# Patient Record
Sex: Male | Born: 1945 | Race: White | Hispanic: No | Marital: Married | Smoking: Former smoker
Health system: Southern US, Community
[De-identification: ages and names within clinical notes are randomized; demographics above are authoritative.]

## PROBLEM LIST (undated history)

## (undated) DIAGNOSIS — C4491 Basal cell carcinoma of skin, unspecified: Secondary | ICD-10-CM

## (undated) DIAGNOSIS — Z87442 Personal history of urinary calculi: Secondary | ICD-10-CM

## (undated) DIAGNOSIS — F8081 Childhood onset fluency disorder: Secondary | ICD-10-CM

## (undated) DIAGNOSIS — H9190 Unspecified hearing loss, unspecified ear: Secondary | ICD-10-CM

## (undated) DIAGNOSIS — S022XXA Fracture of nasal bones, initial encounter for closed fracture: Secondary | ICD-10-CM

## (undated) DIAGNOSIS — Z973 Presence of spectacles and contact lenses: Secondary | ICD-10-CM

## (undated) DIAGNOSIS — N200 Calculus of kidney: Secondary | ICD-10-CM

## (undated) DIAGNOSIS — Z860100 Personal history of colon polyps, unspecified: Secondary | ICD-10-CM

## (undated) DIAGNOSIS — T8859XA Other complications of anesthesia, initial encounter: Secondary | ICD-10-CM

## (undated) DIAGNOSIS — G250 Essential tremor: Secondary | ICD-10-CM

## (undated) DIAGNOSIS — R413 Other amnesia: Secondary | ICD-10-CM

## (undated) DIAGNOSIS — Z9289 Personal history of other medical treatment: Secondary | ICD-10-CM

## (undated) DIAGNOSIS — M1990 Unspecified osteoarthritis, unspecified site: Secondary | ICD-10-CM

## (undated) DIAGNOSIS — Z972 Presence of dental prosthetic device (complete) (partial): Secondary | ICD-10-CM

## (undated) DIAGNOSIS — I639 Cerebral infarction, unspecified: Secondary | ICD-10-CM

## (undated) DIAGNOSIS — R42 Dizziness and giddiness: Secondary | ICD-10-CM

## (undated) DIAGNOSIS — T4145XA Adverse effect of unspecified anesthetic, initial encounter: Secondary | ICD-10-CM

## (undated) DIAGNOSIS — F429 Obsessive-compulsive disorder, unspecified: Secondary | ICD-10-CM

## (undated) DIAGNOSIS — E785 Hyperlipidemia, unspecified: Secondary | ICD-10-CM

## (undated) DIAGNOSIS — H269 Unspecified cataract: Secondary | ICD-10-CM

## (undated) DIAGNOSIS — I10 Essential (primary) hypertension: Secondary | ICD-10-CM

## (undated) DIAGNOSIS — IMO0001 Reserved for inherently not codable concepts without codable children: Secondary | ICD-10-CM

## (undated) HISTORY — DX: Hyperlipidemia, unspecified: E78.5

## (undated) HISTORY — DX: Obsessive-compulsive disorder, unspecified: F42.9

## (undated) HISTORY — PX: CYST EXCISION: SHX5701

## (undated) HISTORY — PX: COLLATERAL LIGAMENT REPAIR, ELBOW: SHX1367

## (undated) HISTORY — DX: Basal cell carcinoma of skin, unspecified: C44.91

## (undated) HISTORY — PX: TONSILLECTOMY: SUR1361

## (undated) HISTORY — DX: Essential tremor: G25.0

## (undated) HISTORY — DX: Unspecified osteoarthritis, unspecified site: M19.90

## (undated) HISTORY — PX: OTHER SURGICAL HISTORY: SHX169

## (undated) HISTORY — DX: Personal history of colonic polyps: Z86.010

## (undated) HISTORY — PX: COLONOSCOPY: SHX174

## (undated) HISTORY — DX: Other amnesia: R41.3

## (undated) HISTORY — DX: Essential (primary) hypertension: I10

## (undated) HISTORY — DX: Personal history of colon polyps, unspecified: Z86.0100

## (undated) HISTORY — DX: Unspecified hearing loss, unspecified ear: H91.90

## (undated) HISTORY — DX: Childhood onset fluency disorder: F80.81

## (undated) HISTORY — PX: EXTERNAL EAR SURGERY: SHX627

---
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
## 1988-09-09 HISTORY — PX: CERVICAL FUSION: SHX112

## 1991-09-10 HISTORY — PX: CHOLESTEATOMA EXCISION: SHX1345

## 2002-04-05 ENCOUNTER — Encounter: Payer: Self-pay | Admitting: Emergency Medicine

## 2002-04-05 ENCOUNTER — Emergency Department (HOSPITAL_COMMUNITY): Admission: EM | Admit: 2002-04-05 | Discharge: 2002-04-05 | Payer: Self-pay | Admitting: Emergency Medicine

## 2002-12-08 ENCOUNTER — Encounter: Payer: Self-pay | Admitting: Internal Medicine

## 2003-03-07 ENCOUNTER — Encounter: Payer: Self-pay | Admitting: Internal Medicine

## 2003-03-29 ENCOUNTER — Ambulatory Visit (HOSPITAL_COMMUNITY): Admission: RE | Admit: 2003-03-29 | Discharge: 2003-03-29 | Payer: Self-pay | Admitting: Orthopedic Surgery

## 2003-03-29 ENCOUNTER — Encounter: Payer: Self-pay | Admitting: Orthopedic Surgery

## 2003-05-03 ENCOUNTER — Ambulatory Visit (HOSPITAL_BASED_OUTPATIENT_CLINIC_OR_DEPARTMENT_OTHER): Admission: RE | Admit: 2003-05-03 | Discharge: 2003-05-03 | Payer: Self-pay | Admitting: Orthopedic Surgery

## 2003-05-24 ENCOUNTER — Ambulatory Visit (HOSPITAL_BASED_OUTPATIENT_CLINIC_OR_DEPARTMENT_OTHER): Admission: RE | Admit: 2003-05-24 | Discharge: 2003-05-24 | Payer: Self-pay | Admitting: Orthopedic Surgery

## 2003-08-16 ENCOUNTER — Ambulatory Visit (HOSPITAL_BASED_OUTPATIENT_CLINIC_OR_DEPARTMENT_OTHER): Admission: RE | Admit: 2003-08-16 | Discharge: 2003-08-16 | Payer: Self-pay | Admitting: Orthopedic Surgery

## 2003-08-16 ENCOUNTER — Ambulatory Visit (HOSPITAL_COMMUNITY): Admission: RE | Admit: 2003-08-16 | Discharge: 2003-08-16 | Payer: Self-pay | Admitting: Orthopedic Surgery

## 2004-04-17 ENCOUNTER — Encounter: Payer: Self-pay | Admitting: Internal Medicine

## 2004-06-02 ENCOUNTER — Ambulatory Visit (HOSPITAL_COMMUNITY): Admission: RE | Admit: 2004-06-02 | Discharge: 2004-06-02 | Payer: Self-pay | Admitting: Orthopedic Surgery

## 2004-07-25 ENCOUNTER — Ambulatory Visit: Payer: Self-pay | Admitting: Internal Medicine

## 2004-08-09 ENCOUNTER — Ambulatory Visit (HOSPITAL_BASED_OUTPATIENT_CLINIC_OR_DEPARTMENT_OTHER): Admission: RE | Admit: 2004-08-09 | Discharge: 2004-08-09 | Payer: Self-pay | Admitting: Orthopedic Surgery

## 2004-08-09 ENCOUNTER — Ambulatory Visit (HOSPITAL_COMMUNITY): Admission: RE | Admit: 2004-08-09 | Discharge: 2004-08-09 | Payer: Self-pay | Admitting: Orthopedic Surgery

## 2004-08-09 HISTORY — PX: SHOULDER ARTHROSCOPY W/ ACROMIAL REPAIR: SUR94

## 2004-08-27 ENCOUNTER — Ambulatory Visit: Payer: Self-pay | Admitting: Internal Medicine

## 2004-09-12 ENCOUNTER — Encounter: Payer: Self-pay | Admitting: Internal Medicine

## 2004-11-02 ENCOUNTER — Ambulatory Visit: Payer: Self-pay | Admitting: Internal Medicine

## 2005-01-21 ENCOUNTER — Ambulatory Visit: Payer: Self-pay | Admitting: Internal Medicine

## 2005-03-14 ENCOUNTER — Ambulatory Visit: Payer: Self-pay | Admitting: Internal Medicine

## 2005-05-23 ENCOUNTER — Ambulatory Visit: Payer: Self-pay | Admitting: Internal Medicine

## 2005-07-11 ENCOUNTER — Encounter: Admission: RE | Admit: 2005-07-11 | Discharge: 2005-07-11 | Payer: Self-pay | Admitting: Otolaryngology

## 2005-07-24 ENCOUNTER — Ambulatory Visit: Payer: Self-pay | Admitting: Internal Medicine

## 2005-11-22 ENCOUNTER — Ambulatory Visit: Payer: Self-pay | Admitting: Internal Medicine

## 2005-12-12 ENCOUNTER — Ambulatory Visit: Payer: Self-pay | Admitting: Internal Medicine

## 2006-01-30 ENCOUNTER — Ambulatory Visit: Payer: Self-pay | Admitting: Internal Medicine

## 2006-01-31 ENCOUNTER — Encounter: Payer: Self-pay | Admitting: Internal Medicine

## 2006-12-11 ENCOUNTER — Ambulatory Visit: Payer: Self-pay | Admitting: Internal Medicine

## 2006-12-11 LAB — CONVERTED CEMR LAB: Hgb A1c MFr Bld: 6.8 % — ABNORMAL HIGH (ref 4.6–6.0)

## 2007-03-17 ENCOUNTER — Ambulatory Visit: Payer: Self-pay | Admitting: Internal Medicine

## 2007-03-17 DIAGNOSIS — L0291 Cutaneous abscess, unspecified: Secondary | ICD-10-CM

## 2007-03-17 DIAGNOSIS — L0390 Cellulitis, unspecified: Secondary | ICD-10-CM

## 2007-05-26 DIAGNOSIS — I10 Essential (primary) hypertension: Secondary | ICD-10-CM

## 2007-05-26 DIAGNOSIS — R251 Tremor, unspecified: Secondary | ICD-10-CM | POA: Insufficient documentation

## 2007-05-26 DIAGNOSIS — Z860100 Personal history of colon polyps, unspecified: Secondary | ICD-10-CM | POA: Insufficient documentation

## 2007-05-26 DIAGNOSIS — M4120 Other idiopathic scoliosis, site unspecified: Secondary | ICD-10-CM | POA: Insufficient documentation

## 2007-05-26 DIAGNOSIS — E1142 Type 2 diabetes mellitus with diabetic polyneuropathy: Secondary | ICD-10-CM

## 2007-05-26 DIAGNOSIS — E1165 Type 2 diabetes mellitus with hyperglycemia: Secondary | ICD-10-CM | POA: Insufficient documentation

## 2007-07-07 ENCOUNTER — Telehealth (INDEPENDENT_AMBULATORY_CARE_PROVIDER_SITE_OTHER): Payer: Self-pay | Admitting: *Deleted

## 2007-08-11 ENCOUNTER — Ambulatory Visit: Payer: Self-pay | Admitting: Internal Medicine

## 2007-08-11 DIAGNOSIS — R109 Unspecified abdominal pain: Secondary | ICD-10-CM | POA: Insufficient documentation

## 2007-10-01 ENCOUNTER — Telehealth (INDEPENDENT_AMBULATORY_CARE_PROVIDER_SITE_OTHER): Payer: Self-pay | Admitting: *Deleted

## 2007-10-16 ENCOUNTER — Telehealth (INDEPENDENT_AMBULATORY_CARE_PROVIDER_SITE_OTHER): Payer: Self-pay | Admitting: *Deleted

## 2007-10-27 ENCOUNTER — Telehealth: Payer: Self-pay | Admitting: Internal Medicine

## 2007-10-29 ENCOUNTER — Ambulatory Visit: Payer: Self-pay | Admitting: Internal Medicine

## 2007-11-02 LAB — CONVERTED CEMR LAB
ALT: 30 units/L (ref 0–53)
AST: 28 units/L (ref 0–37)
Albumin: 4.2 g/dL (ref 3.5–5.2)
Alkaline Phosphatase: 82 units/L (ref 39–117)
BUN: 9 mg/dL (ref 6–23)
Basophils Absolute: 0 10*3/uL (ref 0.0–0.1)
Basophils Relative: 0 % (ref 0.0–1.0)
Bilirubin, Direct: 0.2 mg/dL (ref 0.0–0.3)
CO2: 31 meq/L (ref 19–32)
Calcium: 9.3 mg/dL (ref 8.4–10.5)
Chloride: 106 meq/L (ref 96–112)
Cholesterol: 223 mg/dL (ref 0–200)
Creatinine, Ser: 0.9 mg/dL (ref 0.4–1.5)
Creatinine,U: 49.4 mg/dL
Direct LDL: 160.8 mg/dL
Eosinophils Absolute: 0.1 10*3/uL (ref 0.0–0.6)
Eosinophils Relative: 1 % (ref 0.0–5.0)
GFR calc Af Amer: 111 mL/min
GFR calc non Af Amer: 91 mL/min
Glucose, Bld: 87 mg/dL (ref 70–99)
HCT: 43.3 % (ref 39.0–52.0)
HDL: 40.8 mg/dL (ref >39.0)
Hemoglobin: 14.4 g/dL (ref 13.0–17.0)
Hgb A1c MFr Bld: 6.9 % — ABNORMAL HIGH (ref 4.6–6.0)
Lymphocytes Relative: 24.3 % (ref 12.0–46.0)
MCHC: 33.3 g/dL (ref 30.0–36.0)
MCV: 94 fL (ref 78.0–100.0)
Microalb Creat Ratio: 18.2 mg/g (ref 0.0–30.0)
Microalb, Ur: 0.9 mg/dL (ref 0.0–1.9)
Monocytes Absolute: 0.4 10*3/uL (ref 0.2–0.7)
Monocytes Relative: 5.5 % (ref 3.0–11.0)
Neutro Abs: 4.9 10*3/uL (ref 1.4–7.7)
Neutrophils Relative %: 69.2 % (ref 43.0–77.0)
Phosphorus: 2.3 mg/dL (ref 2.3–4.6)
Platelets: 230 10*3/uL (ref 150–400)
Potassium: 4.3 meq/L (ref 3.5–5.1)
RBC: 4.6 M/uL (ref 4.22–5.81)
RDW: 12.4 % (ref 11.5–14.6)
Sodium: 141 meq/L (ref 135–145)
TSH: 1.47 microintl units/mL (ref 0.35–5.50)
Total Bilirubin: 0.8 mg/dL (ref 0.3–1.2)
Total CHOL/HDL Ratio: 5.5
Total Protein: 7 g/dL (ref 6.0–8.3)
Triglycerides: 99 mg/dL (ref 0–149)
VLDL: 20 mg/dL (ref 0–40)
WBC: 7.1 10*3/uL (ref 4.5–10.5)

## 2007-11-03 ENCOUNTER — Encounter: Payer: Self-pay | Admitting: Internal Medicine

## 2007-11-23 ENCOUNTER — Telehealth: Payer: Self-pay | Admitting: Internal Medicine

## 2007-12-03 ENCOUNTER — Telehealth (INDEPENDENT_AMBULATORY_CARE_PROVIDER_SITE_OTHER): Payer: Self-pay | Admitting: *Deleted

## 2007-12-07 ENCOUNTER — Telehealth (INDEPENDENT_AMBULATORY_CARE_PROVIDER_SITE_OTHER): Payer: Self-pay | Admitting: *Deleted

## 2008-04-05 ENCOUNTER — Telehealth (INDEPENDENT_AMBULATORY_CARE_PROVIDER_SITE_OTHER): Payer: Self-pay | Admitting: *Deleted

## 2008-05-13 ENCOUNTER — Ambulatory Visit: Payer: Self-pay | Admitting: Internal Medicine

## 2008-05-13 DIAGNOSIS — E785 Hyperlipidemia, unspecified: Secondary | ICD-10-CM | POA: Insufficient documentation

## 2008-05-17 LAB — CONVERTED CEMR LAB
BUN: 11 mg/dL (ref 6–23)
Basophils Relative: 0 % (ref 0–1)
Chloride: 109 meq/L (ref 96–112)
Creatinine, Ser: 0.99 mg/dL (ref 0.40–1.50)
Eosinophils Absolute: 0.1 10*3/uL (ref 0.0–0.7)
Glucose, Bld: 88 mg/dL (ref 70–99)
Hemoglobin: 13.4 g/dL (ref 13.0–17.0)
MCHC: 33.3 g/dL (ref 30.0–36.0)
MCV: 90.5 fL (ref 78.0–100.0)
Monocytes Absolute: 0.6 10*3/uL (ref 0.1–1.0)
Monocytes Relative: 10 % (ref 3–12)
Phosphorus: 2.8 mg/dL (ref 2.3–4.6)
RBC: 4.44 M/uL (ref 4.22–5.81)

## 2008-06-08 ENCOUNTER — Ambulatory Visit: Payer: Self-pay | Admitting: Internal Medicine

## 2008-06-23 ENCOUNTER — Ambulatory Visit: Payer: Self-pay | Admitting: Internal Medicine

## 2008-06-23 LAB — CONVERTED CEMR LAB
Albumin: 3.9 g/dL (ref 3.5–5.2)
Bilirubin, Direct: 0.2 mg/dL (ref 0.0–0.3)
Cholesterol: 158 mg/dL (ref 0–200)
HDL: 39.4 mg/dL (ref >39.0)
LDL Cholesterol: 96 mg/dL (ref 0–99)
Total Protein: 6.8 g/dL (ref 6.0–8.3)
VLDL: 22 mg/dL (ref 0–40)

## 2008-07-25 ENCOUNTER — Ambulatory Visit: Payer: Self-pay | Admitting: Internal Medicine

## 2008-07-25 DIAGNOSIS — R079 Chest pain, unspecified: Secondary | ICD-10-CM

## 2008-10-27 ENCOUNTER — Encounter: Payer: Self-pay | Admitting: Internal Medicine

## 2008-11-07 ENCOUNTER — Ambulatory Visit: Payer: Self-pay | Admitting: Internal Medicine

## 2008-11-08 ENCOUNTER — Encounter: Payer: Self-pay | Admitting: Internal Medicine

## 2008-11-08 ENCOUNTER — Telehealth: Payer: Self-pay | Admitting: Internal Medicine

## 2008-11-28 ENCOUNTER — Telehealth: Payer: Self-pay | Admitting: Internal Medicine

## 2008-12-16 ENCOUNTER — Ambulatory Visit: Payer: Self-pay | Admitting: Internal Medicine

## 2008-12-20 ENCOUNTER — Encounter: Payer: Self-pay | Admitting: Internal Medicine

## 2008-12-20 ENCOUNTER — Ambulatory Visit: Payer: Self-pay | Admitting: Gastroenterology

## 2009-03-24 ENCOUNTER — Ambulatory Visit: Payer: Self-pay | Admitting: Internal Medicine

## 2009-03-24 DIAGNOSIS — R5381 Other malaise: Secondary | ICD-10-CM

## 2009-03-24 DIAGNOSIS — R5383 Other fatigue: Secondary | ICD-10-CM

## 2009-03-27 LAB — CONVERTED CEMR LAB
Albumin: 3.7 g/dL (ref 3.5–5.2)
Alkaline Phosphatase: 92 units/L (ref 39–117)
Basophils Relative: 0.6 % (ref 0.0–3.0)
Chloride: 106 meq/L (ref 96–112)
Cholesterol: 162 mg/dL (ref 0–200)
Eosinophils Relative: 1.8 % (ref 0.0–5.0)
Folate: >20 ng/mL
Glucose, Bld: 208 mg/dL — ABNORMAL HIGH (ref 70–99)
HCT: 43 % (ref 39.0–52.0)
HDL: 41 mg/dL (ref >39.00)
Hemoglobin: 14.4 g/dL (ref 13.0–17.0)
LDL Cholesterol: 98 mg/dL (ref 0–99)
Lymphocytes Relative: 21.5 % (ref 12.0–46.0)
Lymphs Abs: 1.6 10*3/uL (ref 0.7–4.0)
Monocytes Relative: 8.1 % (ref 3.0–12.0)
Neutro Abs: 5.2 10*3/uL (ref 1.4–7.7)
Phosphorus: 2.5 mg/dL (ref 2.3–4.6)
Potassium: 4.5 meq/L (ref 3.5–5.1)
RBC: 4.64 M/uL (ref 4.22–5.81)
Sodium: 141 meq/L (ref 135–145)
Total CHOL/HDL Ratio: 4
Total Protein: 6.6 g/dL (ref 6.0–8.3)
Triglycerides: 113 mg/dL (ref 0.0–149.0)
Vit D, 25-Hydroxy: 31 ng/mL (ref 30–89)
Vitamin B-12: 511 pg/mL (ref 211–911)
WBC: 7.5 10*3/uL (ref 4.5–10.5)

## 2009-04-21 ENCOUNTER — Ambulatory Visit: Payer: Self-pay | Admitting: Internal Medicine

## 2009-04-21 ENCOUNTER — Telehealth: Payer: Self-pay | Admitting: Internal Medicine

## 2009-05-01 ENCOUNTER — Telehealth: Payer: Self-pay | Admitting: Internal Medicine

## 2009-06-13 ENCOUNTER — Encounter: Payer: Self-pay | Admitting: Internal Medicine

## 2009-06-19 ENCOUNTER — Telehealth: Payer: Self-pay | Admitting: Internal Medicine

## 2009-06-22 ENCOUNTER — Ambulatory Visit: Payer: Self-pay | Admitting: Internal Medicine

## 2009-08-08 ENCOUNTER — Ambulatory Visit: Payer: Self-pay | Admitting: Internal Medicine

## 2009-08-08 DIAGNOSIS — M549 Dorsalgia, unspecified: Secondary | ICD-10-CM | POA: Insufficient documentation

## 2009-08-23 ENCOUNTER — Ambulatory Visit: Payer: Self-pay | Admitting: Internal Medicine

## 2009-08-25 LAB — CONVERTED CEMR LAB
Albumin: 3.7 g/dL (ref 3.5–5.2)
BUN: 10 mg/dL (ref 6–23)
Basophils Absolute: 0 10*3/uL (ref 0.0–0.1)
CO2: 30 meq/L (ref 19–32)
Chloride: 103 meq/L (ref 96–112)
Creatinine, Ser: 0.9 mg/dL (ref 0.4–1.5)
Eosinophils Absolute: 0.1 10*3/uL (ref 0.0–0.7)
GFR calc non Af Amer: 90.67 mL/min (ref >60)
HCT: 41.8 % (ref 39.0–52.0)
Hemoglobin: 14.1 g/dL (ref 13.0–17.0)
Hgb A1c MFr Bld: 7.7 % — ABNORMAL HIGH (ref 4.6–6.5)
Lymphs Abs: 1.4 10*3/uL (ref 0.7–4.0)
MCHC: 33.6 g/dL (ref 30.0–36.0)
Monocytes Absolute: 0.4 10*3/uL (ref 0.1–1.0)
Neutro Abs: 4 10*3/uL (ref 1.4–7.7)
Platelets: 190 10*3/uL (ref 150.0–400.0)
RDW: 12.3 % (ref 11.5–14.6)

## 2009-12-12 ENCOUNTER — Ambulatory Visit: Payer: Self-pay | Admitting: Internal Medicine

## 2009-12-12 DIAGNOSIS — J019 Acute sinusitis, unspecified: Secondary | ICD-10-CM

## 2009-12-22 ENCOUNTER — Encounter: Payer: Self-pay | Admitting: Internal Medicine

## 2009-12-25 ENCOUNTER — Encounter: Payer: Self-pay | Admitting: Internal Medicine

## 2010-02-13 ENCOUNTER — Encounter: Payer: Self-pay | Admitting: Internal Medicine

## 2010-02-19 ENCOUNTER — Ambulatory Visit: Payer: Self-pay | Admitting: Internal Medicine

## 2010-02-21 ENCOUNTER — Telehealth (INDEPENDENT_AMBULATORY_CARE_PROVIDER_SITE_OTHER): Payer: Self-pay | Admitting: *Deleted

## 2010-02-21 LAB — CONVERTED CEMR LAB
AST: 19 units/L (ref 0–37)
Albumin: 4.3 g/dL (ref 3.5–5.2)
Basophils Relative: 1 % (ref 0.0–3.0)
CO2: 30 meq/L (ref 19–32)
Chloride: 105 meq/L (ref 96–112)
Eosinophils Relative: 2.3 % (ref 0.0–5.0)
Glucose, Bld: 127 mg/dL — ABNORMAL HIGH (ref 70–99)
HDL: 51.1 mg/dL (ref >39.00)
Hemoglobin: 13.8 g/dL (ref 13.0–17.0)
LDL Cholesterol: 85 mg/dL (ref 0–99)
MCV: 93.5 fL (ref 78.0–100.0)
Monocytes Absolute: 0.5 10*3/uL (ref 0.1–1.0)
Neutro Abs: 3.8 10*3/uL (ref 1.4–7.7)
Neutrophils Relative %: 58.3 % (ref 43.0–77.0)
PSA: 0.31 ng/mL (ref 0.10–4.00)
Potassium: 4.1 meq/L (ref 3.5–5.1)
RBC: 4.26 M/uL (ref 4.22–5.81)
Sodium: 142 meq/L (ref 135–145)
Total Bilirubin: 0.4 mg/dL (ref 0.3–1.2)
Total CHOL/HDL Ratio: 3
Triglycerides: 152 mg/dL — ABNORMAL HIGH (ref 0.0–149.0)
VLDL: 30.4 mg/dL (ref 0.0–40.0)
WBC: 6.6 10*3/uL (ref 4.5–10.5)

## 2010-05-16 ENCOUNTER — Telehealth: Payer: Self-pay | Admitting: Internal Medicine

## 2010-05-22 ENCOUNTER — Ambulatory Visit: Payer: Self-pay | Admitting: Internal Medicine

## 2010-05-22 DIAGNOSIS — M79609 Pain in unspecified limb: Secondary | ICD-10-CM

## 2010-05-31 ENCOUNTER — Ambulatory Visit: Payer: Self-pay | Admitting: Internal Medicine

## 2010-06-07 ENCOUNTER — Telehealth: Payer: Self-pay | Admitting: Internal Medicine

## 2010-06-18 ENCOUNTER — Encounter: Payer: Self-pay | Admitting: Internal Medicine

## 2010-07-24 ENCOUNTER — Telehealth: Payer: Self-pay | Admitting: Internal Medicine

## 2010-07-25 ENCOUNTER — Encounter: Payer: Self-pay | Admitting: Internal Medicine

## 2010-07-26 ENCOUNTER — Telehealth (INDEPENDENT_AMBULATORY_CARE_PROVIDER_SITE_OTHER): Payer: Self-pay | Admitting: *Deleted

## 2010-07-26 ENCOUNTER — Encounter: Payer: Self-pay | Admitting: Internal Medicine

## 2010-08-21 ENCOUNTER — Ambulatory Visit: Payer: Self-pay | Admitting: Internal Medicine

## 2010-08-21 DIAGNOSIS — IMO0001 Reserved for inherently not codable concepts without codable children: Secondary | ICD-10-CM

## 2010-08-22 LAB — CONVERTED CEMR LAB
Basophils Absolute: 0 10*3/uL (ref 0.0–0.1)
Eosinophils Absolute: 0.3 10*3/uL (ref 0.0–0.7)
Eosinophils Relative: 4.3 % (ref 0.0–5.0)
MCHC: 34.2 g/dL (ref 30.0–36.0)
MCV: 93.4 fL (ref 78.0–100.0)
Monocytes Absolute: 0.5 10*3/uL (ref 0.1–1.0)
Neutrophils Relative %: 62.5 % (ref 43.0–77.0)
Platelets: 218 10*3/uL (ref 150.0–400.0)
RDW: 12.9 % (ref 11.5–14.6)
Sed Rate: 12 mm/hr (ref 0–22)
Total CK: 157 units/L (ref 7–232)
WBC: 6.3 10*3/uL (ref 4.5–10.5)

## 2010-08-30 ENCOUNTER — Telehealth: Payer: Self-pay | Admitting: Internal Medicine

## 2010-08-31 ENCOUNTER — Ambulatory Visit: Payer: Self-pay | Admitting: Internal Medicine

## 2010-09-07 ENCOUNTER — Ambulatory Visit
Admission: RE | Admit: 2010-09-07 | Discharge: 2010-09-07 | Payer: Self-pay | Source: Home / Self Care | Attending: Internal Medicine | Admitting: Internal Medicine

## 2010-09-07 DIAGNOSIS — E1165 Type 2 diabetes mellitus with hyperglycemia: Secondary | ICD-10-CM

## 2010-09-07 DIAGNOSIS — E119 Type 2 diabetes mellitus without complications: Secondary | ICD-10-CM | POA: Insufficient documentation

## 2010-09-09 HISTORY — PX: BACK SURGERY: SHX140

## 2010-10-04 ENCOUNTER — Telehealth: Payer: Self-pay | Admitting: Internal Medicine

## 2010-10-05 ENCOUNTER — Ambulatory Visit
Admission: RE | Admit: 2010-10-05 | Discharge: 2010-10-05 | Payer: Self-pay | Source: Home / Self Care | Attending: Internal Medicine | Admitting: Internal Medicine

## 2010-10-08 ENCOUNTER — Encounter: Payer: Self-pay | Admitting: Internal Medicine

## 2010-10-09 ENCOUNTER — Telehealth: Payer: Self-pay | Admitting: Internal Medicine

## 2010-10-10 ENCOUNTER — Encounter: Payer: Self-pay | Admitting: Internal Medicine

## 2010-10-11 ENCOUNTER — Telehealth: Payer: Self-pay | Admitting: Internal Medicine

## 2010-10-11 NOTE — Progress Notes (Signed)
Summary: form for diabetic shoes  Phone Note From Pharmacy   Caller: [facility] Summary of Call: Pharmacy is asking for statement of medical necessity for pt's diabetic shoes.  Form is on your desk. Initial call taken by: Lowella Petties CMA, AAMA,  July 26, 2010 4:43 PM  Follow-up for Phone Call        form signed Follow-up by: Cindee Salt MD,  July 26, 2010 5:27 PM  Additional Follow-up for Phone Call Additional follow up Details #1::        Form faxed to [facility]. Additional Follow-up by: Beau Fanny,  July 27, 2010 8:51 AM

## 2010-10-11 NOTE — Miscellaneous (Signed)
Summary: ZOSTAVAX  Clinical Lists Changes  Observations: Added new observation of ZOSTAVAX: Zostavax (12/22/2009 15:11)      Other Immunization History:    Zostavax # 1:  Zostavax (12/22/2009)
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
## 2010-10-11 NOTE — Miscellaneous (Signed)
Summary: [facility]  [facility]   Imported By: Lanelle Bal 12/28/2009 08:01:04  _____________________________________________________________________  External Attachment:    Type:   Image     Comment:   External Document

## 2010-10-11 NOTE — Assessment & Plan Note (Signed)
Summary: INJURED FOOT/ 12:00   Vital Signs:  Patient profile:   65 year old Harris Height:      70 inches Weight:      194 pounds Temp:     97.6 degrees F oral Pulse rate:   76 / minute Pulse rhythm:   regular BP sitting:   140 / 80  (left arm) Cuff size:   large  Vitals Entered By: Selena Batten Dance CMA (AAMA) (May 22, 2010 12:00 PM) CC: Left foot pain Comments No injury that patient can recall. ? Gout. Does a lot of walking   History of Present Illness: CC: L foot pain  Left foot hurts below ball of foot.  Started day before yesterday.  Today really hurting.  Thought due to callus, so wife tried pumice stone which didn't really help.  Pt with diabetes.  Has tried ibuprofen 800mg  which helps some.  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  Walks dog two times a day 1 mile.  Wears brooks at work and walking.    No previous injury to feet, no surgeries.  + h/o gout (podagra).   Current Medications (verified): 1)  Propranolol Hcl 10 Mg  Tabs (Propranolol Hcl) .... Take 1 Tablet By Mouth Two To Three Times Daily. 2)  Pravastatin Sodium 40 Mg Tabs (Pravastatin Sodium) .Marland Kitchen.. 1 Daily 3)  Alprazolam 0.5 Mg Tabs (Alprazolam) .... Take 1 By Mouth Three Times A Day 4)  Fluoxetine Hcl 40 Mg Caps (Fluoxetine Hcl) .... Take 1 By Mouth Once Daily 5)  Glipizide 10 Mg Tabs (Glipizide) .... Take 1 By Mouth Once Daily 6)  Lamictal 150 Mg Tabs (Lamotrigine) .... Take 1 By Mouth Once Daily 7)  Fish Oil 1000 Mg Caps (Omega-3 Fatty Acids) .... Take 1 By Mouth Three Times A Day 8)  Aspirin 81 Mg  Tbec (Aspirin) .... Take One By Mouth Once A Day 9)  Dex4 Glucose 4 Gm Chew (Dextrose (Diabetic Use)) .... Take If Glucose Drops 10)  Folic Acid 400 Mcg Tabs (Folic Acid) .Marland Kitchen.. 1 Daily By Mouth 11)  Epipen 0.3 Mg/0.37ml (1:1000) Devi (Epinephrine Hcl (Anaphylaxis)) .... Use As Directed 12)  Ascensia Autodisc Test  Disk (Glucose Blood) .... Patient Tests 3 Times A Week  Allergies: 1)  ! 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   Msk:  R foot - mild deformity with 5th toe overlapping rest of toes superiorly, somewhat of a high arch, mild collapse of MT (transverse) arch L foot - tender maximally to palpation at distal MT shaft.  2nd toe with beginnings of hammer toe, mild collapse of MT arch, high arch.  skin intact throughout Pulses:  2+ DP/PT Extremities:  no edema Neurologic:  sensation intact.  antalgic gait   Impression & Recommendations:  Problem # 1:  FOOT PAIN, LEFT (ICD-729.5) suspicion for early stress fracture of 2nd L MT shaft vs soft tissue injury vs early hammer toe.  Xray - my  read no fx, will call family if anything different.  Treat for now as early stress fracture, rec decreased walking daily, rest and protect foot using post op shoe. f/u 3-4 wks.  Orders: T-Foot Left Min 3 Views ([id number])  Complete Medication List: 1)  Propranolol Hcl 10 Mg Tabs (Propranolol hcl) .... Take 1 tablet by mouth two to three times daily. 2)  Pravastatin Sodium 40 Mg Tabs (Pravastatin sodium) .Marland Kitchen.. 1 daily 3)  Alprazolam 0.5 Mg Tabs (Alprazolam) .... Take 1 by mouth three times a day 4)  Fluoxetine Hcl 40 Mg Caps (Fluoxetine hcl) .... Take 1 by mouth once daily 5)  Glipizide 10 Mg Tabs (Glipizide) .... Take 1 by mouth once daily 6)  Lamictal 150 Mg Tabs (Lamotrigine) .... Take 1 by mouth once daily 7)  Fish Oil 1000 Mg  Caps (Omega-3 fatty acids) .... Take 1 by mouth three times a day 8)  Aspirin 81 Mg Tbec (Aspirin) .... Take one by mouth once a day 9)  Dex4 Glucose 4 Gm Chew (Dextrose (diabetic use)) .... Take if glucose drops 10)  Folic Acid 400 Mcg Tabs (Folic acid) .Marland Kitchen.. 1 daily by mouth 11)  Epipen 0.3 Mg/0.9ml (1:1000) Devi (Epinephrine hcl (anaphylaxis)) .... Use as directed 12)  Ascensia Autodisc Test Disk (Glucose blood) .... Patient tests 3 times a week 13)  Vicodin 5-500 Mg Tabs (Hydrocodone-acetaminophen) .... Take one by mouth q 6 hours as needed breakthrough pain.  Patient Instructions: 1)  Return in 3-4 weeks for f/u. 2)  Could be a stress fracture of your 2nd toe. 3)  change ibuprofen use to tylenol use.  500mg  every 4-6 hours as needed. 4)  Use post-op shoe for next several weeks when walking, I'd probably recommend decreasing amount of walking daily while your foot is recuperating. 5)  May use vicodin for breakthrough pain, but I'd recommend starting with tylenol.  If you use vicodin, take dose instead of tylenol (as it has some tylenol in it). 6)  Ensure your shoes have enough space in the toe box Prescriptions: EPIPEN 0.3 MG/0.3ML (1:1000) DEVI (EPINEPHRINE HCL (ANAPHYLAXIS)) use as directed  #2 x 0   Entered and Authorized by:   Eustaquio Boyden  MD   Signed by:   Eustaquio Boyden  MD on 05/22/2010   Method used:   Electronically to        [facility] (retail)       [address], [address]       [place], [place]  [address]       Ph: [phone]       Fax: [phone]   RxID:   [id number]   Current Allergies (reviewed today): ! * BEE STINGS CODEINE PHOSPHATE (CODEINE PHOSPHATE) ZYPREXA (OLANZAPINE) LITHIUM CARBONATE (LITHIUM CARBONATE) MYSOLINE (PRIMIDONE) DEPAKOTE (DIVALPROEX SODIUM) ABILIFY (ARIPIPRAZOLE) METFORMIN HCL (METFORMIN HCL) RIOMET (METFORMIN HCL)   Immunizations Administered:  Influenza Vaccine # 1:    Vaccine  Type: Fluvax 3+    Site: left deltoid    Mfr: [employer]axoSmithKline    Dose: 0.5 ml    Route: IM    Given by: Selena Batten Dance CMA (AAMA)    Exp. Date: 03/09/2011    Lot #: [id number]    VIS given: 7/Walter/11 version given May 22, 2010.  Flu Vaccine Consent Questions:    Do you have a history of severe allergic reactions to this vaccine? no    Any prior history of allergic reactions  to egg and/or gelatin? no    Do you have a sensitivity to the preservative Thimersol? no    Do you have a past history of Guillan-Barre Syndrome? no    Do you currently have an acute febrile illness? no    Have you ever had a severe reaction to latex? no    Vaccine information given and explained to patient? yes    Prevention & Chronic Care Immunizations   Influenza vaccine: Fluvax 3+  (05/22/2010)   Influenza vaccine due: 05/11/2011    Tetanus booster: 11/24/2006: Tdap    Pneumococcal vaccine: Pneumovax  (02/19/2010)    H. zoster vaccine: 12/22/2009: Zostavax  Colorectal Screening   Hemoccult: Not documented    Colonoscopy: Multiple polyps removed and sent for path diverticulosis Dr Marva Panda  (12/20/2008)  Other Screening   PSA: 0.31  (02/19/2010)   Smoking status: quit  (05/26/2007)  Diabetes Mellitus   HgbA1C: 8.2  (02/19/2010)    Eye exam: History of ischemic optic neuropathy no diabetic retinopathy  (02/08/2010)   Eye exam due: 12/2009    Foot exam: yes  (02/19/2010)   High risk foot: Not documented   Foot care education: Not documented    Urine microalbumin/creatinine ratio: 3.8  (02/19/2010)  Lipids   Total Cholesterol: 166  (02/19/2010)   LDL: 85  (02/19/2010)   LDL Direct: 160.8  (10/29/2007)   HDL: 51.10  (02/19/2010)   Triglycerides: 152.0  (02/19/2010)    SGOT (AST): 19  (02/19/2010)   SGPT (ALT): 22  (02/19/2010)   Alkaline phosphatase: 72  (02/19/2010)   Total bilirubin: 0.4  (02/19/2010)  Hypertension   Last Blood Pressure: 140 / 80  (05/22/2010)   Serum  creatinine: 0.9  (02/19/2010)   Serum potassium 4.1  (02/19/2010)  Self-Management Support :    Diabetes self-management support: Not documented    Hypertension self-management support: Not documented    Lipid self-management support: Not documented

## 2010-10-11 NOTE — Progress Notes (Signed)
Summary: still having trouble w/ foot  Phone Note Call from Patient Call back at Home Phone [phone]   Caller: Patient Call For: Cindee Salt MD Summary of Call: Patient says that he is still having alot of trouble with his left foot. He says that it hurts when he walks and even when there is no pressure on it, it burns terrribly. He is asking who you would recommend for him to have look at this. He is willing to go to any location except for [facility].  Initial call taken by: [name],  June 07, 2010 4:00 PM  Follow-up for Phone Call        okay to set him up at [facility] ortho unless he would prefer to see Dr Patsy Lager again first Follow-up by: Cindee Salt MD,  June 07, 2010 4:52 PM

## 2010-10-11 NOTE — Letter (Signed)
Summary: Dr[facility]Michael [facility],Note  Dr.Michael Brennan,[facility],Note   Imported By: Beau Fanny 02/15/2010 15:36:39  _____________________________________________________________________  External Attachment:    Type:   Image     Comment:   External Document  Appended Document: Dr.Michael [facility],Note    Clinical Lists Changes  Observations: Added new observation of DIAB EYE EX: History of ischemic optic neuropathy no diabetic retinopathy (02/08/2010 10:30)       Diabetic Eye Exam  Procedure date:  02/08/2010  Findings:      History of ischemic optic neuropathy no diabetic retinopathy
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
## 2010-10-11 NOTE — Progress Notes (Signed)
Summary: diabetic shoes  Phone Note Call from Patient Call back at Home Phone [phone]   Caller: Patient Call For: Cindee Salt MD Summary of Call: Patient is asking if you would write a rx for him to get a pair of diabetic shoes. He says that his feet are still burning when he walks and he has already been seen by [employer] [facility]. They told him that he would  prbably greatly benefit from the diabetic shoes. He would like to come in and pick up rx when ready.  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

## 2010-10-11 NOTE — Letter (Signed)
Summary: Oletta Lamas MD  Oletta Lamas MD   Imported By: Lanelle Bal 07/10/2010 13:10:25  _____________________________________________________________________  External Attachment:    Type:   Image     Comment:   External Document  Appended Document: Oletta Lamas MD given inserts for high arches meloxicam 7.5mg  also

## 2010-10-11 NOTE — Progress Notes (Signed)
Summary: Pneumovax Reaction  Phone Note Call from Patient Call back at Home Phone [phone]   Caller: Patient Call For: Cindee Salt MD Summary of Call: Patient says he received a Pneumovax injection at his last visit ([facility]) and his arm is very sore to the point that it woke him up quite a bit last night.  It is not warm to touch, no rash, no swelling except he can feel an oblong knot under the injection site.  He is just reporting the reaction. Initial call taken by: Delilah Shan CMA [name]),  February 21, 2010 10:02 AM  Follow-up for Phone Call        he can try ice packs for 10 minutes at a time---that can reduce swelling and pain Follow-up by: Cindee Salt MD,  February 21, 2010 10:30 AM  Additional Follow-up for Phone Call Additional follow up Details #1::        Patient Advised.  Additional Follow-up by: Delilah Shan CMA (AAMA),  February 21, 2010 10:40 AM

## 2010-10-11 NOTE — Medication Information (Signed)
Summary: Diabetic Shoes & Inserts/[facility]  Diabetic Shoes & Inserts/ [facility]   Imported By: Lanelle Bal 08/01/2010 15:27:57  _____________________________________________________________________  External Attachment:    Type:   Image     Comment:   External Document

## 2010-10-11 NOTE — Letter (Signed)
Summary: CMN for Therapeutic Shoes/[facility]  CMN for Therapeutic Shoes/[facility]   Imported By: Lanelle Bal 07/31/2010 15:47:37  _____________________________________________________________________  External Attachment:    Type:   Image     Comment:   External Document

## 2010-10-11 NOTE — Progress Notes (Signed)
Summary: scratch on arm  Phone Note Call from Patient Call back at Home Phone [phone]   Caller: Patient Call For: Cindee Salt MD Summary of Call: Patient called regarding a scratch he has on his arm. He got it while working on a piece of equipment a couple of days ago. He says that it is a little red and the scratch itself is a little raised. It isn't really causing him any pain and he hasn't noticed any signs of infection. He has been keeping it clean wih soapy water and applying antibiotic ointment. He will keep an eye on the scratch and continue cleaning and applying ointment and call back w/ any changes.  Initial call taken by: [name],  May 16, 2010 9:25 AM  Follow-up for Phone Call        Okay check on him tomorrow and if it is worsening at all, give him appt (he tends to underestimate the issues) I will see 1:30PM or end of day as needed) Follow-up by: Cindee Salt MD,  May 16, 2010 9:37 AM  Additional Follow-up for Phone Call Additional follow up Details #1::        spoke with pt, he ran into a pitchfork 3 days ago, he just wanted to be sure he didn't need an abx. I advised pt if he wakes up tomorrow and it looks different(red, swollen, hot) to give [place] a call so we can add him on to our schedule. DeShannon Smith CMA [name])  May 16, 2010 12:32 PM   spoke with patient and he states that the scratch is much better. Additional Follow-up by: Mervin Hack CMA [name]),  May 17, 2010 5:23 PM
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
## 2010-10-11 NOTE — Progress Notes (Signed)
Summary: High BS  Phone Note Call from Patient Call back at Home Phone [phone]   Caller: Patient Call For: Cindee Salt MD Summary of Call: pt calling to advise he checked his BS this morning and it was 420 after eating a bowl of cereal, (rice krispies w/o sugar and low fat milk). Pt stated he will re-check again in about an hour and call to let us know, I advised pt that Dr.Letvak would be in this afternoon and I would ask what can be done. Initial call taken by: Mervin Hack CMA [name]),  August 30, 2010 11:02 AM  Follow-up for Phone Call        this may be reason why he has had less energy Please have him come in for HgbA1c soon If more elevated, we will need to start another medication Cindee Salt MD  August 30, 2010 1:03 PM   Spoke with patient and advised results. Lab appt scheduled tomorrow at 8:15am, pt re-checked BS twice, it went up to 479, then came down to 233. DeShannon Smith CMA [name])  August 30, 2010 2:41 PM    Okay Follow-up by: Cindee Salt MD,  August 30, 2010 2:57 PM
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
## 2010-10-11 NOTE — Progress Notes (Signed)
Summary: refill request for vicodin, soma  Phone Note Refill Request Message from:  Patient  Refills Requested: Medication #1:  VICODIN 5-500 MG TABS take one by mouth up to every  6 hours as needed for  breakthrough pain.  Medication #2:  soma 350 mg Pt is requesting refills on these for muscle spasms.  Walter Harris is no longer on med list.  He says he gets muscle spams in his back once in a while and needs these meds to get through the spells.  Uses medicap.  Initial call taken by: Lowella Petties CMA, AAMA,  October 04, 2010 10:38 AM  Follow-up for Phone Call        okay vicodin #30 x 0   carisprodol (soma) 350  1/2 -1 at bedtime as needed for muscle spasms #30 x 1 Follow-up by: Cindee Salt MD,  October 04, 2010 1:07 PM  Additional Follow-up for Phone Call Additional follow up Details #1::        Rx called to pharmacy Additional Follow-up by: DeShannon Smith CMA [name]),  October 04, 2010 5:10 PM    New/Updated Medications: CARISOPRODOL 350 MG TABS (CARISOPRODOL) 1/2 -1 at bedtime as needed for muscle spasms Prescriptions: CARISOPRODOL 350 MG TABS (CARISOPRODOL) 1/2 -1 at bedtime as needed for muscle spasms  #30 x 1   Entered by:   Mervin Hack CMA (AAMA)   Authorized by:   Cindee Salt MD   Signed by:   Mervin Hack CMA (AAMA) on 10/04/2010   Method used:   Telephoned to ...       [facility] [phone]* (retail)       [address] [place], [place]  [address]       Ph: [phone]       Fax: [phone]   RxID:   [id number] VICODIN 5-500 MG TABS (HYDROCODONE-ACETAMINOPHEN) take one by mouth up to every  6 hours as needed for  breakthrough pain.  #30 x 0   Entered by:   Mervin Hack CMA (AAMA)   Authorized by:   Cindee Salt MD   Signed by:   Mervin Hack CMA (AAMA) on 10/04/2010   Method used:   Telephoned to ...       [facility] [phone]* (retail)       [address] [place], [place]  [address]       Ph:  [phone]       Fax: [phone]   RxID:   [id number] VICODIN 5-500 MG TABS (HYDROCODONE-ACETAMINOPHEN) take one by mouth up to every  6 hours as needed for  breakthrough pain.  #30 x 0   Entered by:   Mervin Hack CMA (AAMA)   Authorized by:   Cindee Salt MD   Signed by:   Mervin Hack CMA (AAMA) on 10/04/2010   Method used:   Telephoned to ...       [facility] [address] (retail)       [address]       [place], [place]  [address]       Ph: [phone]       Fax: [phone]   RxID:   [id number] CARISOPRODOL 350 MG TABS (CARISOPRODOL) 1/2 -1 at bedtime as needed for muscle spasms  #30 x 1   Entered by:   Mervin Hack CMA (AAMA)  Authorized by:   Cindee Salt MD   Signed by:   Mervin Hack CMA (AAMA) on 10/04/2010   Method used:   Electronically to        [facility] [address] (retail)       [address], [address]       [place], [place]  [address]       Ph: [phone]       Fax: [phone]   RxID:   [id number]

## 2010-10-12 ENCOUNTER — Ambulatory Visit (INDEPENDENT_AMBULATORY_CARE_PROVIDER_SITE_OTHER): Payer: MEDICARE | Admitting: Family Medicine

## 2010-10-12 ENCOUNTER — Encounter: Payer: Self-pay | Admitting: Family Medicine

## 2010-10-12 DIAGNOSIS — M549 Dorsalgia, unspecified: Secondary | ICD-10-CM

## 2010-10-16 ENCOUNTER — Encounter: Payer: Self-pay | Admitting: Internal Medicine

## 2010-10-16 ENCOUNTER — Ambulatory Visit (INDEPENDENT_AMBULATORY_CARE_PROVIDER_SITE_OTHER): Payer: MEDICARE | Admitting: Internal Medicine

## 2010-10-16 DIAGNOSIS — IMO0002 Reserved for concepts with insufficient information to code with codable children: Secondary | ICD-10-CM | POA: Insufficient documentation

## 2010-10-17 ENCOUNTER — Encounter: Payer: Self-pay | Admitting: Internal Medicine

## 2010-10-17 NOTE — Progress Notes (Signed)
Summary: vicodin is not helping pain  Phone Note Call from Patient Call back at Home Phone [phone]   Caller: Patient Summary of Call: Pt says vicodin is not helping his back pain.  He is asking if he can take 2 at a time or is there something stronger that he can take.  He is taking also taking 800 mg's of ibuprofen, alternating with vicodin.  Uses medicap.  Initial call taken by: Lowella Petties CMA, AAMA,  October 09, 2010 12:43 PM  Follow-up for Phone Call        okay to try 2 at a time--but if he needs that more than a few days, I will need to see him to reassess  Hopefully the PT will help Has he gone for his evaluation? Follow-up by: Cindee Salt MD,  October 09, 2010 1:27 PM  Additional Follow-up for Phone Call Additional follow up Details #1::        spoke with pt's wife and she states he went to physical therapy yesterday, pt went back to work today per the physical therapist recommendations. DeShannon Smith CMA [name])  October 09, 2010 3:22 PM   Okay Additional Follow-up by: Cindee Salt MD,  October 09, 2010 4:12 PM

## 2010-10-17 NOTE — Assessment & Plan Note (Signed)
Summary: BACK PAIN   Vital Signs:  Patient profile:   65 year old male Height:      70 inches Weight:      194.50 pounds BMI:     28.01 Temp:     98.3 degrees F oral Pulse rate:   104 / minute Pulse rhythm:   regular BP sitting:   150 / 100  (left arm) Cuff size:   large  Vitals Entered By: Delilah Shan CMA [name]) (October 12, 2010 9:25 AM) CC: Severe back pain   History of Present Illness: Back pain. Prev notes reviewed.  The patient coughed and has been in pain since then.  Pain in back, radiating down into the L leg, compensating on R leg.  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 "Sometimes it gets me, even if I don't do anything."  H/o physical therapy last week, has been doing exercises at home.  Muscle relaxers and the steroids did help some with the spasm.  Lower back continues to be sore but is some better.    Allergies: 1)  ! * Bee Stings 2)  Codeine Phosphate (Codeine Phosphate) 3)  Zyprexa (Olanzapine) 4)  Lithium Carbonate (Lithium Carbonate) 5)  Mysoline (Primidone) 6)  Depakote (Divalproex Sodium) 7)  Abilify (Aripiprazole) 8)  Metformin Hcl (Metformin Hcl) 9)  Riomet (Metformin Hcl)  Review of Systems       See HPI.  Otherwise negative.    Physical Exam  General:  no apparent distress but uncomfortable regular rate and rhythm clear to auscultation bilaterally back with lower lumbar midline pain and paraspinal tenderness.  L SLR pos but strength, sensation, dtrs wnl for bilateral lower extremities.    Impression & Recommendations:  Problem # 1:  BACK PAIN (ICD-724.5) I would add on gradual increase of neurontin with sedation caution.  The spasm appears to be improving and the neurontin may help with the sciatica pain.  He understands and will follow up with Dr. Alphonsus Sias as needed.  No weakness, nontoxic and okay for outpatient follow up. No indication to reimage today.  His updated medication list for this problem includes:    Vicodin 5-500 Mg Tabs  (Hydrocodone-acetaminophen) .Marland Kitchen... Take one by mouth up to every  6 hours as needed for  breakthrough pain.    Aspirin 81 Mg Tbec (Aspirin) .Marland Kitchen... Take one by mouth once a day    Carisoprodol 350 Mg Tabs (Carisoprodol) .Marland Kitchen... 1/2 -1 at bedtime as needed for muscle spasms  Orders: Prescription Created Electronically [phone])  Complete Medication List: 1)  Vicodin 5-500 Mg Tabs (Hydrocodone-acetaminophen) .... Take one by mouth up to every  6 hours as needed for  breakthrough pain. 2)  Propranolol Hcl 10 Mg Tabs (Propranolol hcl) .... Take 1 tablet by mouth two to three times daily. 3)  Alprazolam 0.5 Mg Tabs (Alprazolam) .... Take 1 by mouth three times a day 4)  Fluoxetine Hcl 40 Mg Caps (Fluoxetine hcl) .... Take 1 by mouth once daily 5)  Glipizide 10 Mg Tabs (Glipizide) .... Take 1 by mouth two times a day 6)  Lamictal 150 Mg Tabs (Lamotrigine) .... Take 1 by mouth once daily 7)  Aspirin 81 Mg Tbec (Aspirin) .... Take one by mouth once a day 8)  Folic Acid 400 Mcg Tabs (Folic acid) .Marland Kitchen.. 1 daily by mouth 9)  Epipen 0.3 Mg/0.42ml (1:1000) Devi (Epinephrine hcl (anaphylaxis)) .... Use as directed 10)  Ascensia Autodisc Test Disk (Glucose blood) .... Patient tests 3 times a week 11)  Carisoprodol 350 Mg Tabs (Carisoprodol) .... 1/2 -1 at bedtime as needed for muscle spasms 12)  Prednisone 20 Mg Tabs (Prednisone) .... 2 tabs daily for 5 days, then 1 tab daily for 5 days for severe back pain 13)  Gabapentin 300 Mg Caps (Gabapentin) .Marland Kitchen.. 1 by mouth today, increase to two times a day and then three times a day for pain with sedation caution  Patient Instructions: 1)  Keep taking your pain meds and continue with physical therapy.  Start the neurontin/gabapentin.  It can make you drowsy.  Start with one a day and increase up to three times a day.  Let Dr. Alphonsus Sias know if you aren't improving.  Take care.  Prescriptions: GABAPENTIN 300 MG CAPS (GABAPENTIN) 1 by mouth today, increase to two times a day and  then three times a day for pain with sedation caution  #30 x 1   Entered and Authorized by:   Crawford Givens MD   Signed by:   Crawford Givens MD on 10/12/2010   Method used:   Electronically to        [facility] [phone]* (retail)       [address] [place], [place]  [address]       Ph: [phone]       Fax: [phone]   RxID:   [id number]    Orders Added: 1)  Est. Patient Level III [72536] 2)  Prescription Created Electronically [phone]    Current Allergies (reviewed today): ! * BEE STINGS CODEINE PHOSPHATE (CODEINE PHOSPHATE) ZYPREXA (OLANZAPINE) LITHIUM CARBONATE (LITHIUM CARBONATE) MYSOLINE (PRIMIDONE) DEPAKOTE (DIVALPROEX SODIUM) ABILIFY (ARIPIPRAZOLE) METFORMIN HCL (METFORMIN HCL) RIOMET (METFORMIN HCL)

## 2010-10-17 NOTE — Progress Notes (Signed)
Summary: asking about chiropractor  Phone Note Call from Patient Call back at Home Phone [phone]   Caller: Patient Call For: Cindee Salt MD Summary of Call: Pt would like to see a chiropractor in [place] and he is asking if you know of a good one.           Lowella Petties CMA, AAMA  October 11, 2010 2:53 PM   Follow-up for Phone Call        I know some people who are happy with Dr Alfredo Bach or Dr Mitzi Hansen or Dr Jeannetta Ellis Follow-up by: Cindee Salt MD,  October 11, 2010 3:06 PM  Additional Follow-up for Phone Call Additional follow up Details #1::        Spoke with patient and advised results.  Additional Follow-up by: Mervin Hack CMA [name]),  October 11, 2010 4:49 PM

## 2010-10-18 ENCOUNTER — Encounter: Payer: Self-pay | Admitting: Internal Medicine

## 2010-10-22 ENCOUNTER — Telehealth: Payer: Self-pay | Admitting: Internal Medicine

## 2010-10-22 ENCOUNTER — Encounter: Payer: Self-pay | Admitting: Internal Medicine

## 2010-10-23 ENCOUNTER — Telehealth: Payer: Self-pay | Admitting: Internal Medicine

## 2010-10-24 ENCOUNTER — Telehealth: Payer: Self-pay | Admitting: Internal Medicine

## 2010-10-25 NOTE — Miscellaneous (Signed)
SummaryScientist, physiological [facility]   [facility]   Imported By: Kassie Mends 10/16/2010 08:49:03  _____________________________________________________________________  External Attachment:    Type:   Image     Comment:   External Document
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
## 2010-10-29 ENCOUNTER — Encounter: Payer: Self-pay | Admitting: Internal Medicine

## 2010-10-29 ENCOUNTER — Ambulatory Visit: Payer: Self-pay | Admitting: Internal Medicine

## 2010-10-30 ENCOUNTER — Encounter: Payer: Self-pay | Admitting: Internal Medicine

## 2010-10-30 ENCOUNTER — Ambulatory Visit (INDEPENDENT_AMBULATORY_CARE_PROVIDER_SITE_OTHER): Payer: MEDICARE | Admitting: Internal Medicine

## 2010-10-30 DIAGNOSIS — IMO0002 Reserved for concepts with insufficient information to code with codable children: Secondary | ICD-10-CM

## 2010-10-31 ENCOUNTER — Telehealth: Payer: Self-pay | Admitting: Internal Medicine

## 2010-10-31 ENCOUNTER — Encounter: Payer: Self-pay | Admitting: Internal Medicine

## 2010-10-31 NOTE — Medication Information (Signed)
Summary: [facility]  [facility]   Imported By: Kassie Mends 10/22/2010 10:01:17  _____________________________________________________________________  External Attachment:    Type:   Image     Comment:   External Document

## 2010-10-31 NOTE — Progress Notes (Signed)
Summary: form for handicapped placard  Phone Note Call from Patient Call back at Home Phone [phone]   Caller: Patient Call For: Cindee Salt MD Summary of Call: Pt states his handicapped placard has expired and he needs a new one.  Form is on your desk. Initial call taken by: Lowella Petties CMA, AAMA,  October 22, 2010 12:26 PM  Follow-up for Phone Call        form signed  Can't walk 200 feet now without resting and having pain Hopefully will be short lived Follow-up by: Cindee Salt MD,  October 22, 2010 1:28 PM  Additional Follow-up for Phone Call Additional follow up Details #1::        left message on machine that handicapp placecard ready for pick-up  Additional Follow-up by: Mervin Hack CMA [name]),  October 22, 2010 2:08 PM

## 2010-10-31 NOTE — Progress Notes (Signed)
Summary: pain is worse  Phone Note Call from Patient   Caller: Patient Call For: Cindee Salt MD Summary of Call: Pt called and stated that he is in excrutiating pain and will not live long enough to have an MRI.  He says the pain is now down both legs, worse than when he was called this morning.  After checking with Dr. Para March I advised pt that if he is in that much pain, with the new symptom of if now being in both legs, that he should go to urgent care or ER.  He said that he works at [employer] and the wait in the ER there is 10 hours.  He told me that I dont understand what we are putting him through and he hung up. Initial call taken by: Lowella Petties CMA, AAMA,  October 24, 2010 4:04 PM  Follow-up for Phone Call        I will call him tomorrow and discuss increased pain meds Cindee Salt MD  October 24, 2010 5:55 PM   Pt's wife called this morning, requesting pt be given something stronger, like demerol- she says he has to have something heavy  duty to work for him- or else he takes too many.    Lowella Petties CMA, AAMA  October 25, 2010 8:26 AM   Additional Follow-up for Phone Call Additional follow up Details #1::        discussed with patient  just finishing prednisone has MRI on Monday Yesterday was terrible---some better today  will give Rx for oxycodone Told him his wife could come and pick it up Additional Follow-up by: Cindee Salt MD,  October 25, 2010 12:49 PM    New/Updated Medications: OXYCODONE HCL 5 MG TABS (OXYCODONE HCL) 1-2 tabs up to four times daily for severe back pain Prescriptions: OXYCODONE HCL 5 MG TABS (OXYCODONE HCL) 1-2 tabs up to four times daily for severe back pain  #60 x 0   Entered and Authorized by:   Cindee Salt MD   Signed by:   Cindee Salt MD on 10/25/2010   Method used:   Print then Give to Patient   RxID:   [id number]

## 2010-10-31 NOTE — Progress Notes (Signed)
Summary: has several issues  Phone Note Call from Patient Call back at Home Phone [phone]   Caller: Patient Call For: Walter Salt MD Summary of Call: Pt states he now has increased urination- sometimes up to 5 times an hour, and when he urinates he has severe pain down his leg.  Also, physical therapist has told him that he thinks the problems in his back and leg are caused by spinal stenosis.    The pain medicine that he is taking is not helping- he is up several times during the night because of the pain.  He sometimes takes up to 1000 mg's of ibuprofen, and he knows that isnt good for him.  He is asking what he should do next, should he come back sooner then what is scheduled?    Initial call taken by: Lowella Petties CMA, AAMA,  October 23, 2010 3:31 PM  Follow-up for Phone Call        spinal stenosis doesn't come on all of a sudden like his pain did  If he is not getting any better by next week, will go ahead and check MRI Follow-up by: Walter Salt MD,  October 24, 2010 7:52 AM  Additional Follow-up for Phone Call Additional follow up Details #1::        Physical therapist, Jackquline Berlin [phone]) called with same information that patient gave yesterday. Increased urination with pain running down legs, now also has increase bowel movements and also has bilateral foot numbness with feet staying cold all of the time.  [name]  October 24, 2010 9:51 AM  we can go ahead and order MRI I will send order May need referral to neurosurgeon sooner rather than later Additional Follow-up by: Walter Salt MD,  October 24, 2010 9:56 AM    Additional Follow-up for Phone Call Additional follow up Details #2::    Spoke with patient and advised results. Pt would also like to have an "injection" in his back? pt states he father had back pain and would get the injection every 30days. I advised pt he would need to get the MRI 1st and then proceed from there. Pt  agreed Follow-up by: Mervin Hack CMA (AAMA),  October 24, 2010 10:06 AM

## 2010-11-02 ENCOUNTER — Inpatient Hospital Stay (HOSPITAL_COMMUNITY): Payer: Medicare Other

## 2010-11-02 ENCOUNTER — Observation Stay (HOSPITAL_COMMUNITY)
Admission: RE | Admit: 2010-11-02 | Discharge: 2010-11-02 | Disposition: A | Discharge status: Home / Self Care | Payer: Medicare Other | Source: Ambulatory Visit | Attending: Neurosurgery | Admitting: Neurosurgery

## 2010-11-02 DIAGNOSIS — E119 Type 2 diabetes mellitus without complications: Secondary | ICD-10-CM | POA: Insufficient documentation

## 2010-11-02 DIAGNOSIS — F319 Bipolar disorder, unspecified: Secondary | ICD-10-CM | POA: Insufficient documentation

## 2010-11-02 DIAGNOSIS — M47817 Spondylosis without myelopathy or radiculopathy, lumbosacral region: Secondary | ICD-10-CM | POA: Insufficient documentation

## 2010-11-02 DIAGNOSIS — M5126 Other intervertebral disc displacement, lumbar region: Principal | ICD-10-CM | POA: Insufficient documentation

## 2010-11-02 DIAGNOSIS — I10 Essential (primary) hypertension: Secondary | ICD-10-CM | POA: Insufficient documentation

## 2010-11-02 LAB — BASIC METABOLIC PANEL
BUN: 14 mg/dL (ref 6–23)
CO2: 28 mEq/L (ref 19–32)
Chloride: 101 mEq/L (ref 96–112)
Creatinine, Ser: 0.95 mg/dL (ref 0.4–1.5)
Potassium: 5.2 mEq/L — ABNORMAL HIGH (ref 3.5–5.1)

## 2010-11-02 LAB — CBC
HCT: 43.3 % (ref 39.0–52.0)
Hemoglobin: 15.1 g/dL (ref 13.0–17.0)
MCH: 31.3 pg (ref 26.0–34.0)
MCV: 89.8 fL (ref 78.0–100.0)
RBC: 4.82 MIL/uL (ref 4.22–5.81)
WBC: 7.8 10*3/uL (ref 4.0–10.5)

## 2010-11-02 LAB — GLUCOSE, CAPILLARY: Glucose-Capillary: 213 mg/dL — ABNORMAL HIGH (ref 70–99)

## 2010-11-05 ENCOUNTER — Telehealth: Payer: Self-pay | Admitting: Internal Medicine

## 2010-11-06 ENCOUNTER — Ambulatory Visit: Payer: Self-pay | Admitting: Internal Medicine

## 2010-11-06 NOTE — Progress Notes (Signed)
Summary: Dropped Oxycodone  Phone Note Call from Patient Call back at Home Phone [phone]   Caller: Patient Call For: Cindee Salt MD Summary of Call: patient calling stating that he dropped all of his Oxycodone in the dish water, he only has 2 tablets left, pt states he can't live without this medication and needs a refill. Pt also states he on his way to the surgeon, should he discuss pain meds with him? Please advise. Initial call taken by: Mervin Hack CMA [name]),  October 31, 2010 2:20 PM  Follow-up for Phone Call        Unfortunately, I cannot refill the medication because of its high level of Drug Enforcement Administration control  he will need to discuss plans with the neurosurgeon Follow-up by: Cindee Salt MD,  October 31, 2010 2:22 PM  Additional Follow-up for Phone Call Additional follow up Details #1::        per patient he was going to appt with neurosurgeon and advised ok to leave message on machine. Left message with results and advised pt to call if any questions. Additional Follow-up by: Mervin Hack CMA [name]),  October 31, 2010 3:49 PM    a

## 2010-11-06 NOTE — Assessment & Plan Note (Signed)
Summary: DISCUSS MRI RESULTS/CLE  MEDICARE   Vital Signs:  Patient profile:   65 year old male Weight:      196 pounds O2 Sat:      96 % on Room air Pulse rate:   96 / minute Pulse rhythm:   regular BP sitting:   150 / 90  (left arm) Cuff size:   large  Vitals Entered By: Mervin Hack CMA [name]) (October 30, 2010 11:37 AM)  O2 Flow:  Room air CC: discuss MRI results   History of Present Illness: Still having severe pain able to walk a littel better with rollator walker  oxycodone 10mg  will knock a little off the pain but it persists trying to keep walking and staying loose (PT directed exercises)  Pain is in left buttock and all the way down to left foot some pain along right hip as well---may be due to favoiring left side when walking and sleeping  all started 4 weeks ago  Allergies: 1)  ! 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 Take one by mouth up to every  6 hours as needed for  breakthrough pain. His updated medication list for this problem includes:    Oxycodone Hcl 5 Mg Tabs (Oxycodone hcl) .Marland Kitchen... 1-2 tabs up to four times daily for severe back pain    Aspirin 81 Mg Tbec (Aspirin) .Marland Kitchen... Take one by mouth once a day  Orders: Neurosurgeon Referral (Neurosurgeon)  Complete Medication List: 1)  Propranolol Hcl 10 Mg Tabs  (Propranolol hcl) .... Take 1 tablet by mouth two to three times daily. 2)  Alprazolam 0.5 Mg Tabs (Alprazolam) .... Take 1 by mouth three times a day 3)  Fluoxetine Hcl 40 Mg Caps (Fluoxetine hcl) .... Take 1 by mouth once daily 4)  Glipizide 10 Mg Tabs (Glipizide) .... Take 1 by mouth two times a day 5)  Lamictal 150 Mg Tabs (Lamotrigine) .... Take 1 by mouth once daily 6)  Epipen 0.3 Mg/0.49ml (1:1000) Devi (Epinephrine hcl (anaphylaxis)) .... Use as directed 7)  Gabapentin 300 Mg Caps (Gabapentin) .... 2 tabs in morning and 3-4 at bedtime for nerve pain 8)  Oxycodone Hcl 5 Mg Tabs (Oxycodone hcl) .Marland Kitchen.. 1-2 tabs up to four times daily for severe back pain 9)  Aspirin 81 Mg Tbec (Aspirin) .... Take one by mouth once a day 10)  Folic Acid 400 Mcg Tabs (Folic acid) .Marland Kitchen.. 1 daily by mouth 11)  Ascensia Autodisc Test Disk (Glucose blood) .... Patient tests 3 times a week  Patient Instructions: 1)  Please schedule a follow-up appointment in 3 months .  2)  PLease cancel next week's appt 3)  Referral Appointment Information 4)  Day/Date: 5)  Time: 6)  Place/MD: 7)  Address: 8)  Phone/Fax: 9)  Patient given appointment information. Information/Orders faxed/mailed.   Orders Added: 1)  Est. Patient Level III [[address]] 2)  Neurosurgeon Referral [Neurosurgeon]  Current Allergies (reviewed today): ! * BEE STINGS CODEINE PHOSPHATE (CODEINE PHOSPHATE) ZYPREXA (OLANZAPINE) LITHIUM CARBONATE (LITHIUM CARBONATE) MYSOLINE (PRIMIDONE) DEPAKOTE (DIVALPROEX SODIUM) ABILIFY (ARIPIPRAZOLE) METFORMIN HCL (METFORMIN HCL) RIOMET (METFORMIN HCL)

## 2010-11-07 ENCOUNTER — Ambulatory Visit (INDEPENDENT_AMBULATORY_CARE_PROVIDER_SITE_OTHER): Payer: Medicare Other | Admitting: Internal Medicine

## 2010-11-07 ENCOUNTER — Encounter: Payer: Self-pay | Admitting: Internal Medicine

## 2010-11-08 ENCOUNTER — Encounter: Payer: Self-pay | Admitting: Internal Medicine

## 2010-11-12 ENCOUNTER — Telehealth: Payer: Self-pay | Admitting: Family Medicine

## 2010-11-15 NOTE — Op Note (Signed)
Walter Harris, Walter Harris NO.:  [id number]  MEDICAL RECORD NO.:  [id number]           PATIENT TYPE:  I  LOCATION:  3528                         FACILITY:  [facility]  PHYSICIAN:  Donalee Citrin, M.D.        DATE OF BIRTH:  Jun 03, 1946  DATE OF PROCEDURE:  11/02/2010 DATE OF DISCHARGE:  11/02/2010                              OPERATIVE REPORT   PREOPERATIVE DIAGNOSIS:  Lumbar spondylosis with radiculopathy L5 left from ruptured disk L5-S1 left with a superior fragment displacing the L5 nerve root.  PROCEDURE:  Lumbar laminectomy, microdiskectomy L5-S1 with microdissection of the left L5 nerve root and microdissection of the left S1 nerve root and microscopic diskectomy.  SURGEON:  Donalee Citrin, MD  ANESTHESIA:  General endotracheal.  HISTORY OF PRESENT ILLNESS:  The patient is a 65 year old gentleman who presents with over last several weeks progressive worsening back and left leg pain radiating down the back of his leg, top of his foot, and his big toe with weakness in his big toe and weakness on dorsiflexion. The patient's MRI scan showed a large disk herniation with a free fragment migrating superiorly from the L5-S1 disk space displacing undersurface of the L5 nerve root against L5 pedicle on the left.  Due to the patient's clinical exam with weakness, MRI findings, and failure of conservative treatment, the patient was recommended laminectomy and microdiskectomy.  I went over the risks and benefits of operation with him, and he understood and agreed to proceed forward.  The patient was brought to the OR, was induced under general anesthesia, positioned prone on the Wilson frame.  Back was prepped and draped in usual sterile fashion.  Preop incision was localized at the appropriate level.  After infiltration of 9 mL of lidocaine with epi, a midline incision was made and Bovie electrocautery was used to take down subcutaneous and subperiosteal dissection carried out on  the lamina of L5 and S1 on the left.  Intraoperative x-ray identified the appropriate level and confirmed, so the inferior aspect L5 medial facet complex and superior aspect of L5 and medial facet complex and superior aspect of S1 was taken down with a 2-mm Kerrison punch.  Virtually, two-thirds of the lamina of L5 was drilled away to identify the proximal takeoff of the L5 nerve root.  Then using a 2-mm Kerrison punch, proximal takeoff of the L5 nerve roots identified as well as the S1 nerve root was unroofed. Disk space was then incised, were inspected.  There was disk bulge at the level of disk space and free fragment was immediately identified superior to the disk space underneath the L5 nerve root.  These were incised with a lumbar scalpel.  Several large free fragments were removed from the L5 nerve root decompressing back that area then the disk space was cleaned out with Epstein curettes, pituitary rongeurs, decompressing thecal sac and the S1 nerve root.  Then with tracking of the L5 nerve root of the foramen with a coronary dilator and angled hockey stick confirmed patency.  I also explored medially, caudally, and cephalad also confirming decompression of the central canal  in the S1 foramen.  The wound was then copiously irrigated.  Meticulous hemostasis was maintained. Gelfoam was laid on top of the dura.  The muscle and fascia reapproximated in layers with interrupted Vicryl, and the skin was closed with a running 4-0 subcuticular.  Benzoin and Steri-Strips were applied.  The patient went to recovery room in stable condition.          ______________________________ Donalee Citrin, M.D.     GC/MEDQ  D:  11/02/2010  T:  11/02/2010  Job:  [id number]  Electronically Signed by Donalee Citrin M.D. on 11/14/2010 04:01:29 PM

## 2010-11-15 NOTE — Letter (Signed)
Summary: Self Health Assessment  Self Health Assessment   Imported By: Maryln Gottron 11/05/2010 11:06:52  _____________________________________________________________________  External Attachment:    Type:   Image     Comment:   External Document
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
## 2010-11-15 NOTE — Progress Notes (Signed)
Summary: Concerned with blood sugar  Phone Note Call from Patient   Reason for Call: Talk to Nurse, Talk to Doctor Details for Reason: Concerned about blood sugar Summary of Call: Pt called this morning and stated that he had back surgery on Friday and there were concerns about his blood sugar and they asked him to contact his physician.  They gave him insulin while he was there for back surgery.  Please return call.  Thank you. Initial call taken by: Clarisa Schools,  November 05, 2010 9:09 AM  Follow-up for Phone Call        Spoke to patient and was informed that his BS has been running over 200 daily. Patient stated that they would not tell him what his blood sugar was when they gave him the insulin. Patient stated that he ate about 30 minutes ago and his BS now is 361. Patient states that his BS has been over 500 and that Dr. Alphonsus Sias is aware of this and that is why his medication was increased recently. Follow-up by: Sydell Axon LPN,  November 05, 2010 9:37 AM  Additional Follow-up for Phone Call Additional follow up Details #1::        It is probably still up from the prednisone he was on. Please have him monitor it over the next few days---if it doesn't start coming down, we may need to start a once a day insulin to control it  Please check with him over the next few days Cindee Salt MD  November 05, 2010 10:05 AM  Patient notified as instructed by telephone. Sydell Axon LPN  November 05, 2010 10:49 AM

## 2010-11-20 NOTE — Letter (Signed)
Summary: [facility]   Imported By: Kassie Mends 11/12/2010 10:59:37  _____________________________________________________________________  External Attachment:    Type:   Image     Comment:   External Document

## 2010-11-20 NOTE — Progress Notes (Signed)
Summary: blood sugar is elevated  Phone Note Call from Patient   Caller: Patient Summary of Call: Pt called, stated blood sugar is 513 and he feels lousy.  Per Dr. Sharen Hones I advised pt to increase his insulin to 15 units tonight, drink a lot of water between now and then.  Call tomorrow and let us know how he is doing.  I advised him that if he doesnt feel better tonight, or if he feels worse, that he needs to go to the ER.  Pt stated he doesnt want to go to ER because there is always a long wait.  I again told him that he needs to go to ER if no improvement. Initial call taken by: Lowella Petties CMA, AAMA,  November 12, 2010 5:07 PM  Follow-up for Phone Call        agree.   Follow-up by: Eustaquio Boyden  MD,  November 12, 2010 5:10 PM

## 2010-11-23 ENCOUNTER — Telehealth: Payer: Self-pay | Admitting: Internal Medicine

## 2010-11-25 NOTE — Discharge Summary (Signed)
  NAMEBLESSED, COTHAM NO.:  [id number]  MEDICAL RECORD NO.:  [id number]           PATIENT TYPE:  I  LOCATION:  3528                         FACILITY:  [facility]  PHYSICIAN:  Donalee Citrin, M.D.        DATE OF BIRTH:  12-Feb-1946  DATE OF ADMISSION:  11/02/2010 DATE OF DISCHARGE:  11/02/2010                              DISCHARGE SUMMARY   ADMITTING DIAGNOSIS:  Lumbar spondylosis with radiculopathy L5, left from a large superior fragment L5-S1, left.  HOSPITAL COURSE:  The patient was admitted and immediately went to the operating room and underwent the above-mentioned procedure. Postoperatively, the patient did very well in recovery and then on the floor.  On the floor, patient convalesced well.  He was able to be checked and discharged later that same day.  His wound was clean and dry, his wound was flat.  His strength had significantly improved, and pain was improved and the patient was discharged in stable condition. Scheduled followup in approximately one week's time.          ______________________________ Donalee Citrin, M.D.     GC/MEDQ  D:  11/14/2010  T:  11/15/2010  Job:  [id number]  Electronically Signed by Donalee Citrin M.D. on 11/25/2010 03:56:41 PM

## 2010-11-26 ENCOUNTER — Telehealth: Payer: Self-pay | Admitting: Internal Medicine

## 2010-11-27 NOTE — Progress Notes (Signed)
Summary: rash in groin   Phone Note Call from Patient Call back at Home Phone [phone]   Caller: Patient Call For: Cindee Salt MD Summary of Call: Patient says that he has a itchy rash in groin are that has been there for about a week. He is asking if he could get something called in or if there is something that could be used OTC. Please advise. Uses [facility]. Initial call taken by: [name],  November 23, 2010 3:08 PM  Follow-up for Phone Call        Please let him know I sent Rx for cream to try Follow-up by: Cindee Salt MD,  November 23, 2010 3:19 PM  Additional Follow-up for Phone Call Additional follow up Details #1::        Patient advised as instructed via telephone. Additional Follow-up by: Linde Gillis CMA [name]),  November 23, 2010 4:39 PM    New/Updated Medications: KETOCONAZOLE 2 % CREA (KETOCONAZOLE) apply to groin rash three times a day till clear Prescriptions: KETOCONAZOLE 2 % CREA (KETOCONAZOLE) apply to groin rash three times a day till clear  #60gm x 1   Entered and Authorized by:   Cindee Salt MD   Signed by:   Cindee Salt MD on 11/23/2010   Method used:   Electronically to        [facility] [phone]* (retail)       [address] [place], [place]  [address]       Ph: [phone]       Fax: [phone]   RxID:   [id number]

## 2010-12-03 ENCOUNTER — Ambulatory Visit: Payer: Self-pay | Admitting: Internal Medicine

## 2010-12-06 NOTE — Progress Notes (Signed)
Summary: can't use insulin pen  Phone Note Call from Patient Call back at Home Phone [phone]   Caller: Patient Call For: Walter Salt MD Summary of Call: Patient says that he is not strong enough to use his insulin pen. He says that he is having to use the kitchen counter to push it in and it is causing black and blue bruises on his stomach. He would like to use regular insulin. Uses [facility].  Initial call taken by: [name],  November 26, 2010 1:42 PM  Follow-up for Phone Call        okay to switch over to regular vial and syringes Walter Salt MD  November 26, 2010 2:10 PM   sent rx sent to pharmacy, Spoke with patient and advised results.  Follow-up by: Mervin Hack CMA [name]),  November 26, 2010 5:17 PM    New/Updated Medications: LANTUS 100 UNIT/ML SOLN (INSULIN GLARGINE) Please inject 10 units daily and increase as directed. MAy need 30 or more units daily BD INSULIN SYRINGE 25G X 1" 1 ML MISC (INSULIN SYRINGE-NEEDLE U-100) use as directed Prescriptions: BD INSULIN SYRINGE 25G X 1" 1 ML MISC (INSULIN SYRINGE-NEEDLE U-100) use as directed  #86mth x 11   Entered by:   Mervin Hack CMA (AAMA)   Authorized by:   Walter Salt MD   Signed by:   Mervin Hack CMA (AAMA) on 11/26/2010   Method used:   Electronically to        [facility] [phone]* (retail)       [address] [place], [place]  [address]       Ph: [phone]       Fax: [phone]   RxID:   [id number] LANTUS 100 UNIT/ML SOLN (INSULIN GLARGINE) Please inject 10 units daily and increase as directed. MAy need 30 or more units daily  #36mth x 11   Entered by:   Mervin Hack CMA (AAMA)   Authorized by:   Walter Salt MD   Signed by:   Mervin Hack CMA (AAMA) on 11/26/2010   Method used:   Electronically to        [facility] [phone]* (retail)       [address] [place], [place]  [address]       Ph: [phone]       Fax: [phone]  RxID:   [id number]

## 2010-12-13 NOTE — Op Note (Signed)
  NAMEJAYLYN, Walter Harris NO.:  [id number]  MEDICAL RECORD NO.:  [id number]           PATIENT TYPE:  I  LOCATION:  3528                         FACILITY:  [facility]  PHYSICIAN:  Donalee Citrin, M.D.        DATE OF BIRTH:  [birth year]  DATE OF PROCEDURE:  11/02/2010 DATE OF DISCHARGE:  11/02/2010                              OPERATIVE REPORT   ADDENDUM  The addendum is in relation to how he was classified on admission, he was admitted as an inpatient, that is incorrect.  He went home the same day, it should be outpatient or 23-hour obs.  He was discharged on the same day, underwent outpatient procedure, which was laminectomy, microdiskectomy at L5-S1 on the left.  So this was an outpatient procedure, it should have been a outpatient 23-hour obs on admission, and I would like to be changed in the operative summary and resubmitted to his insurance company.          ______________________________ Donalee Citrin, M.D.     GC/MEDQ  D:  12/11/2010  T:  12/12/2010  Job:  [id number]  Electronically Signed by Donalee Citrin M.D. on 12/13/2010 08:31:15 AM

## 2010-12-19 ENCOUNTER — Telehealth: Payer: Self-pay | Admitting: *Deleted

## 2010-12-19 NOTE — Telephone Encounter (Signed)
He had told me that he knew how to do it [facility], he should go for the counselling as well If he just needs instruction on injecting insulin, we can do that here with a nurse visit (at your convenience)

## 2010-12-19 NOTE — Telephone Encounter (Signed)
Pt states he needs an order to take to the [facility] to be instructed on insulin injections.  States he was given insulin but now instructed on how to inject it.  Please call when ready and he will pick up.

## 2010-12-21 NOTE — Telephone Encounter (Signed)
Patient's wife already made nurse visit appt to be shown how to do insulin injections. Spoke with patient and he is ok with this.

## 2010-12-25 ENCOUNTER — Other Ambulatory Visit: Payer: Self-pay | Admitting: *Deleted

## 2010-12-25 MED ORDER — PROPRANOLOL HCL 10 MG PO TABS: ORAL_TABLET | ORAL | Status: DC

## 2011-01-04 ENCOUNTER — Ambulatory Visit: Payer: Medicare Other

## 2011-01-16 ENCOUNTER — Other Ambulatory Visit: Payer: Self-pay | Admitting: *Deleted

## 2011-01-16 MED ORDER — ALPRAZOLAM 0.5 MG PO TABS: 0.5000 mg | ORAL_TABLET | Freq: Three times a day (TID) | ORAL | Status: DC | PRN

## 2011-01-16 NOTE — Telephone Encounter (Signed)
Okay #90 x 0 

## 2011-01-16 NOTE — Telephone Encounter (Signed)
Routed to PCP 

## 2011-01-16 NOTE — Telephone Encounter (Signed)
Rx phoned into pharmacy.

## 2011-01-18 ENCOUNTER — Ambulatory Visit (INDEPENDENT_AMBULATORY_CARE_PROVIDER_SITE_OTHER): Payer: Medicare Other | Admitting: Internal Medicine

## 2011-01-18 ENCOUNTER — Telehealth: Payer: Self-pay | Admitting: *Deleted

## 2011-01-18 ENCOUNTER — Encounter: Payer: Self-pay | Admitting: Internal Medicine

## 2011-01-18 VITALS — BP 120/80 | HR 86 | Temp 97.6°F

## 2011-01-18 DIAGNOSIS — K529 Noninfective gastroenteritis and colitis, unspecified: Secondary | ICD-10-CM

## 2011-01-18 DIAGNOSIS — K5289 Other specified noninfective gastroenteritis and colitis: Secondary | ICD-10-CM

## 2011-01-18 MED ORDER — PROMETHAZINE HCL 12.5 MG PO TABS: 12.5000 mg | ORAL_TABLET | Freq: Four times a day (QID) | ORAL | Status: AC | PRN

## 2011-01-18 NOTE — Patient Instructions (Signed)
Please go to the Emergency Room if your abdominal pain worsens No more immodium

## 2011-01-18 NOTE — Progress Notes (Signed)
Subjective:    Patient ID: Walter Harris, male    DOB: [birth year], 65 y.o.   MRN: [id number]  HPI Worked at hospital 3 days ago Has started again being careful with back  Started with diaphoresis and stomach upset since Tuesday Wondered if it could be food poisoning  Today has had stomach pain--"like my stomach will blow up" Copious diarrhea--- up to 3-4 times per hour No blood  Some nausea and vomiting Able to drink water, some coffee, mountain dew No appetite  No fever  Current outpatient prescriptions:ALPRAZolam (XANAX) 0.5 MG tablet, Take 1 tablet (0.5 mg total) by mouth 3 (three) times daily as needed., Disp: 90 tablet, Rfl: 0;  aspirin 81 MG tablet, Take one by mouth once a day , Disp: , Rfl: ;  carisoprodol (SOMA) 350 MG tablet, 1/2 -1 at bedtime as needed for muscle spasms , Disp: , Rfl: ;  EPINEPHrine (EPIPEN) 0.3 mg/0.3 mL DEVI, Use as directed , Disp: , Rfl:  FLUoxetine (PROZAC) 40 MG capsule, Take 40 mg by mouth daily.  , Disp: , Rfl: ;  folic acid (FOLVITE) 400 MCG tablet, Take 400 mcg by mouth daily.  , Disp: , Rfl: ;  gabapentin (NEURONTIN) 300 MG capsule, 1 by mouth today, increase to two times a day and then three times a day for pain with sedation caution , Disp: , Rfl: ;  glipiZIDE (GLUCOTROL) 10 MG tablet, Take 10 mg by mouth 2 (two) times daily.  , Disp: , Rfl:  Glucose Blood (ASCENSIA AUTODISC TEST) DISK, Patient test 3 times a week , Disp: , Rfl: ;  HYDROcodone-acetaminophen (VICODIN) 5-500 MG per tablet, Take 1 tablet by mouth every 6 (six) hours as needed.  , Disp: , Rfl: ;  lamoTRIgine (LAMICTAL) 150 MG tablet, Take 150 mg by mouth daily.  , Disp: , Rfl: ;  propranolol (INDERAL) 10 MG tablet, Take one tablet by mouth two to three times daily, Disp: 90 tablet, Rfl: 3  Past Medical History  Diagnosis Date  . Hypertension   . Diabetes mellitus   . Hyperlipidemia   . History of colonic polyps   . Bipolar 1 disorder   . OCD (obsessive compulsive disorder)   .  Stuttering   . Tremor, essential   . 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 DJD (degenerative joint disease)     lumbar spine    Past Surgical History  Procedure Date  . Cervical fusion 1990  . Cholesteatoma excision 1993  . Other surgical history     benign head tumor#5  . Other surgical history     sebaceous cysts-post neck x5  . Appendectomy 1960  . External ear surgery     multiple ear surguries as a child  . Tonsillectomy   . Collateral ligament repair, elbow     bilateral, nerve improvement left 08/04, right 09/04  . Shoulder arthroscopy w/ acromial repair 08/2004    left shoulder    Family History  Problem Relation Age of Onset  . OCD Mother   . Bipolar disorder Mother   . Cancer Mother   . OCD Sister   . OCD Sister     History   Social History  . Marital Status: Married    Spouse Name: N/A    Number of Children: 1  . Years of Education: N/A   Occupational History  . disabled    Social History Main Topics  . Smoking status: Former Games developer  .  Smokeless tobacco: Not on file  . Alcohol Use: No  . Drug Use: Not on file  . Sexually Active: Not on file   Other Topics Concern  . Not on file   Social History Narrative  . No narrative on file   Review of Systems Some cough--only when lying down No SOB No dysuria or hematuria     Objective:   Physical Exam  Constitutional: He appears well-developed.       Mildly uncomfortable  Neck: Normal range of motion.  Pulmonary/Chest: Effort normal and breath sounds normal. No respiratory distress. He has no wheezes. He has no rales.  Abdominal:       Mild distention Fairly normal BS Mild generalized tenderness--with slight rebound No focal tenderness Nothing to suggest peritonitis though  Musculoskeletal: He exhibits no edema and no tenderness.  Lymphadenopathy:    He has no cervical adenopathy.          Assessment & Plan:

## 2011-01-18 NOTE — Telephone Encounter (Signed)
Patients wife called stating that her husband has been having nausea and diarrhea for three days, no fever, no vomiting.  She is concerned because he is a diabetic.  He is not eating but he is staying hydrated.  She is requesting a Rx be sent in to ease the nausea and the diarrhea.  He has taken Imodium but not much relief.  Please advise.  Uses [facility] pharmacy.

## 2011-01-18 NOTE — Telephone Encounter (Signed)
Treating the diarrhea with immodium will prolong the illness and may cause the nausea. Please stop. If he can take fluids, I would not use any meds Should be seen if sig fever or abdominal pain

## 2011-01-18 NOTE — Telephone Encounter (Signed)
Pt being seen today

## 2011-01-25 NOTE — Op Note (Signed)
NAMEMATVEY, LLANAS                ACCOUNT NO.:  [id number]   MEDICAL RECORD NO.:  [id number]          PATIENT TYPE:  AMB   LOCATION:  DSC                          FACILITY:  [facility]   PHYSICIAN:  Katy Fitch. Sypher Montez Hageman., M.D.DATE OF BIRTH:  1945/12/09   DATE OF PROCEDURE:  08/09/2004  DATE OF DISCHARGE:                                 OPERATIVE REPORT   PREOPERATIVE DIAGNOSES:  1.  Adhesive capsulitis, left shoulder.  2.  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   POSTOPERATIVE DIAGNOSES:  1.  Adhesive capsulitis, left shoulder.  2.  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   OPERATION PERFORMED:  1.  Examination of left shoulder under anesthesia.  2.  Arthroscopic capsulectomy and debridement of approximately 35% deep      surface degenerative rotator cuff tear.  3.  Arthroscopic subacromial decompression with coracoacromial ligament      release, acromioplasty.  4.  Open distal clavicle resection, i.e. Mumford procedure.   SURGEON:  Katy Fitch. Sypher, M.D.   ASSISTANT:  Jonni Sanger, P.A.   ANESTHESIA:  General endotracheal supplemented by interscalene block.   SUPERVISING ANESTHESIOLOGIST:  Janetta Hora. Gelene Mink, M.D.   INDICATIONS FOR PROCEDURE:  Walter Harris is a 65 year old right hand  dominant gentleman who is status post reconstruction of a right rotator cuff  tear.  Recently, he noted similar discomfort on the left side and has been  known to have longstanding  acromioclavicular arthropathy.  Clinical  examination revealed signs of mild adhesive capsulitis and acromioclavicular  arthropathy.  Plain films confirmed acromioclavicular arthropathy with  prominent inferior osteophytes on the medial acromion and distal clavicle.  MRI of the shoulder documented a partial thickness deep surface rotator cuff  tear with significant tendonopathy of the supraspinatus and infraspinatus  tendons as well as acromioclavicular arthropathy.  There was no sign of  retracted rotator cuff tear.  Due to failure to respond to nonoperative  measures, Walter Harris is brought to the operating room at this time  anticipating examination of the shoulder under anesthesia, arthroscopic  capsulectomy for his adhesive capsulitis predicament, deep surface rotator  cuff debridement and management of his impingement process.  He is  anticipating distal clavicle resection.  Should we find a significant tear  of the rotator cuff, he was anticipating rotator cuff repair.   DESCRIPTION OF PROCEDURE:  Walter Harris was brought to the operating room  and placed in supine position on the operating table.  Following induction  of general anesthesia by endotracheal technique, the left shoulder was  carefully examined under anesthesia.  Initial range  of motion revealed  elevation 140, external rotation 50, internal rotation 10.  After gentle  manipulation, range of motion was increased to elevation 170, external  rotation 90 and internal rotation of 70 at 90 degrees abduction.  The  typical capsular adhesion crepitation was noted.   The procedure commenced with positioning of Walter Harris into the beach chair  position with the aid of a torso and head holder designed for shoulder  arthroscopy followed by prep of the left arm and forequarter with DuraPrep  and draping with impervious arthroscopy drapes with Coban on the arm.  The  shoulder was distended with 15 mL of sterile saline placed through  a  posterior portal with a 20 gauge spinal needle followed by placement of the  scope with blunt technique through a standard posterior portal.  Diagnostic  arthroscopy immediately revealed abundant granulations consistent with  chronic adhesive capsulitis.  These were investing the anterior capsular  ligaments, the rotator interval, the subscapularis and the deep surface of  the supraspinatus.  The superior labrum was partially obliterated by  granulations.  There were minimal degenerative changes of the superior  labrum.  The biceps anchor was sound.  The biceps tendon was normal to the  rotator interval.  The deep surface of the rotator cuff was inspected and  found to have about a 35% degenerative tear of the supraspinatus extending  back to the inferior infraspinatus.  An anterior portal was created under  direct vision followed by use of a 4.5 mm suction shaver to debride the  granulations and to perform a tenolysis of the subscapularis tendon.  The  rotator interval was opened with partial resection of the coracohumeral  ligament.  The areas of bleeding created by debridement of the granulation  tissue were electrocauterized by bipolar cautery followed by use of the  suction shaver to debride the partial thickness rotator cuff tear.  Punctate  bleeding was noted at the depths of debridement after removal of all the  necrotic tendon fibers.  There was no sign of retracted tear or full  thickness tear.  Photographic documentation of the rotator cuff pathology  was obtained pre and post debridement.  The minor labral tearing was  debrided to a smooth margin with a suction shaver.  The arthroscopic  equipment was removed from the glenohumeral joint and placed in the  subacromial space.  There was a florid bursitis noted.  After bursectomy the  anatomy of the coracoacromial arch was studied.  The acromioclavicular joint was prominent.  The distal clavicle was contacting the rotator cuff.   The  coracoacromial ligament was rather thick and causing impingement  anterolaterally.  The coracoacromial ligament was taken down with cutting  cautery followed by use of the suction shaver to debride bursa.  A suction  bur was used to level the acromion to a type 1 morphology and to round the  lateral tip of the acromion.  The acromioclavicular joint was photographed  documenting a considerable degree of prominence of the inferior distal  clavicle.  The distal clavicle was outlined by use of the cutting cautery to  release the capsule anteriorly, inferiorly and posteriorly.  The  arthroscopic equipment was then temporarily removed while a distal clavicle  resection was performed through a 2 cm incision.  A 2 cm incision was  fashioned directly over the distal clavicle and brought down through the  subcutaneous tissues to the capsule.  A 15 blade was used to release the  capsular  ligaments and a small osteotome used to dissect and expose the  distal 15 mm of clavicle subperiosteally.  Baby Bennett retractors were  placed followed by use of an oscillating saw to remove the distal clavicle.  The acromioplasty was palpated and found to be satisfactory.  The site of  impingement was well documented by digital palpation while elevating the arm  to 90 degrees abduction and external rotation of 70 degrees.  The rotator  cuff was swollen at the site of the partial tear.  The anterior deltoid and  anterior trapezius muscles were then repaired with mattress suture of #2  FiberWire followed by repair of the skin with subdermal sutures of 2-0  Vicryl and intradermal 3-0 Prolene.  The scope was replaced in the  subacromial space and used to debride clot from the distal clavicle  resection as well as to obtain satisfactory hemostasis of the entire  subacromial space.  After a final irrigation of the subacromial space, the  arthroscopic equipment was removed and the portals repaired with a mattress   suture of 3-0 Prolene.  There were no  apparent complications.  Walter Harris was awakened from anesthesia and  transferred to the recovery room with stable vital signs.  He is going to be  admitted to the recovery care center for observation of his vital signs and  appropriate analgesics in the form of  IV PCA morphine, p.o. and IV Dilaudid  as well as Ancef 1 g IV every eight hours.      Robe   RVS/MEDQ  D:  08/09/2004  T:  08/09/2004  Job:  [id number]

## 2011-01-25 NOTE — Op Note (Signed)
NAMEMORRIS, LONGENECKER                          ACCOUNT NO.:  [id number]   MEDICAL RECORD NO.:  [id number]                   PATIENT TYPE:  AMB   LOCATION:  DSC                                  FACILITY:  [facility]   PHYSICIAN:  Katy Fitch. Naaman Plummer., M.D.          DATE OF BIRTH:  [birth year]   DATE OF PROCEDURE:  08/16/2003  DATE OF DISCHARGE:                                 OPERATIVE REPORT   PREOPERATIVE DIAGNOSIS:  Clinical examination suggesting full-thickness  rotator cuff tear with plain x-ray evidence of severe AC arthritis and a  type 3 acromion and MRI evidence of a degenerative full-thickness rotator  cuff tear involving supraspinatus and infraspinatus tendons.   POSTOPERATIVE DIAGNOSIS:  Clinical examination suggesting full-thickness  rotator cuff tear with plain x-ray evidence of severe AC arthritis and a  type 3 acromion and MRI evidence of a degenerative full-thickness rotator  cuff tear involving supraspinatus and infraspinatus tendons.   PROCEDURE:  1. Diagnostic arthroscopy right glenohumeral joint followed by arthroscopic     debridement of synovitis and deep surface rotator cuff tear involving the     supraspinatus and infraspinatus tendons.  2. Open reconstruction of right rotator cuff tear with repair of     supraspinatus and infraspinatus to decorticated greater tuberosity     utilizing biocorkscrew anchors x3 and through-bone McLaughlin suture     technique.  3. Open resection of distal clavicle with repair of trapezius and deltoid     muscles, ie, Mumford procedure.   SURGEON:  Katy Fitch. Sypher, M.D.   ASSISTANT:  Jonni Sanger, P.A.   ANESTHESIA:  General endotracheal supplemented by interscalene block.  Supervising anesthesiologist is Bedelia Person, M.D.   INDICATIONS FOR PROCEDURE:  The patient is a 65 year old right-hand-dominant  gentleman referred by Hewitt Shorts, M.D. for evaluation and management  of bilateral ulnar neuropathy.  During his  treatment for his bilateral ulnar  neuropathy predicament, he noted increasing pain and weakness of his right  shoulder girdle.  Clinical examination revealed signs of stage III  impingement with weakness of abduction and external rotation.  Plain films  documented AC arthropathy in a type 3 acromion and an MRI documented severe  degenerative changes in the supraspinatus and infraspinatus tendons  consistent with a full-thickness rotator cuff tear.   After informed consent, he is brought to the operating room at this time  anticipating reconstruction of his right rotator cuff and appropriate  decompression with distal clavicle resection.   DESCRIPTION OF PROCEDURE:  The patient is brought to the operating room and  placed in supine position upon the operating table.  Dr. Gypsy Balsam had placed an  interscalene block in the holding area leading to satisfactory anesthesia of  the right forequarter.   Following induction of general orotracheal anesthesia, he was carefully  positioned in the beach chair position with torso and head holders,  uncontrolled arthroscopy.  1 gram of Ancef  was administered as an IV  prophylactic antibiotic.   The right arm was prepped with Duraprep and draped with impervious  arthroscopy drapes.   The shoulder joint was distended with 20 mL of sterile saline followed by  introduction of the arthroscope with standard blunt technique through a  posterior portal.  Diagnostic arthroscopy revealed intact hilar and  articular cartilage surfaces of the glenoid and humeral head.  The anterior  glenohumeral ligaments were normal.  The subscapularis tendon was normal.  The biceps tendon at its anchor and the entire tendon through the rotator  interval was normal.  The deep surface of the supraspinatus and  infraspinatus tendons was extremely degenerative, necrotic, and ruptured.  This was documented photographically.  The suction shaver was used through  an anterior portal  to debride synovitis within the joint to allow full  visualization of the cuff tear.  There was a tear extending from the rotator  interval posteriorly involving the entire supraspinatus and infraspinatus  tendons and just a small portion of the Teres minor.   The 4.5 mm suction shaver was used to debride all free fragments of necrotic  tendon.  The greater tuberosity was cleaned of necrotic tendon as well.  The  arthroscopic equipment was then removed and placed in the subacromial space.  A floor bursitis was noted leading me to conclude that it was reasonable to  proceed directly to open repair of the tendon.   A 4 cm incision was fashioned from the [facility] joint across the anterior aspect  of the deltoid insertion on the anterior acromion.  This was taken down  sharply through the capsule of the [facility] joint.  The distal 15 mm of clavicle  was exposed with a 15 blade followed by placement of baby Bennett  retractors.  An oscillating saw was used to remove the distal 15 mm of  clavicle.  A large medial osteophyte of the acromion was removed with an  oscillating saw and a large type 3 anterior acromial spur created by  ossification of the coracoacromial ligament insertion was removed with the  oscillating saw.  The deep surfaces of the acromion were filed with a hand  rasp to a smooth type 1 morphology.  An anterior lateral spur was removed  with the hand rasp.   The fat pad below the [facility] joint and the fat pad surrounding the bursa was  left intact.   The cuff tear was easily palpable through the bursa.  The bursa was split  longitudinally and a severely necrotic retracted cuff tear involving the  entire supraspinatus and infraspinatus was identified.   A bur was used to decorticate the greater tuberosity over a width of  approximately 18 mm and from posterior to anterior approximately 3.5 cm.  Three 5 mm biocorkscrew anchors were placed medially to create a satisfactory footprint for repair  followed by placement of a McLaughlin type  grasping suture at the split in the tendons.   A total of six sutures were used with mattress technique to reinsert the  supraspinatus and infraspinatus to the decorticated greater tuberosity  followed by use of the McLaughlin sutures to advance the tendons laterally  to the proper position on the greater tuberosity.  A very smooth inset  repair was achieved to decorticated bleeding cancellous bone.   The profile of the tuberosity was lowered approximately 4 mm.   After inset of the tendons, the anterior deltoid and trapezius were repaired  to close the dead space  at the site of distal clavicle resection with  mattress suture of #2 Fiberwire followed by use of #2 Fiberwire mattress  grasping sutures to repair the anterior deltoid to the acromion.   The deltoid split was repaired with interrupted suture of 0 Vicryl.  The  wound was then repaired with subdermal sutures of 2-0 Vicryl and intradermal  3-0 Prolene.  There were no apparent complications.   The patient tolerated the surgery and anesthesia well.  He was transferred  to the recovery room with stable vital signs.  For aftercare, he will be  admitted to the [facility] for observation of his vital signs,  appropriate analgesics in the form of IV PCA morphine, p.o. and IV Dilaudid,  and Ancef 1 gram IV q.8h. as a prophylactic antibiotic x24 hours.                                               Katy Fitch Naaman Plummer., M.D.    RVS/MEDQ  D:  08/16/2003  T:  08/16/2003  Job:  [id number]

## 2011-01-25 NOTE — Op Note (Signed)
Walter Harris, Walter Harris                          ACCOUNT NO.:  [id number]   MEDICAL RECORD NO.:  [id number]                   PATIENT TYPE:  AMB   LOCATION:  DSC                                  FACILITY:  [facility]   PHYSICIAN:  Katy Fitch. Naaman Plummer., M.D.          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   PROCEDURE:  1. Decompression of right ulnar nerve at cubital tunnel in situ.  2. Resection of anconeus epitrochlearis muscle and resection of a small band     of the medial head of the triceps for decompression of the left cubital     tunnel.   SURGEON:  Katy Fitch. Sypher, M.D.   ASSISTANT:  Jonni Sanger, P.A.   ANESTHESIA:  General by LMA.  Supervising anesthesiologist is Janetta Hora.  Gelene Mink, M.D.   INDICATIONS FOR PROCEDURE:  The patient is a 65 year old right-hand-dominant  man referred by Hewitt Shorts, M.D. for evaluation and management of  bilateral ulnar neuropathy at the cubital tunnel.   Clinical examination reveals signs of McGowen III bilateral ulnar neuropathy  with marked weakness of intrinsic muscles.  An EMG nerve conduction study  demonstrated  conduction velocities at approximately 40 meters per second.   Due to a failure to respond to nonoperative measures and due to his muscle  atrophy and weakness, we recommended proceeding to direct decompression of  the ulnar nerve at the right cubital tunnel at this time.   DESCRIPTION OF PROCEDURE:  The patient was brought to the operating room and  placed in a supine position on the operating table.  Following induction of  general anesthesia, the right arm was prepped with Betadine soap and  solution and sterilely draped.  Following exsanguination of the limb with  Esmarch tourniquet, the arterial tourniquet on the proximal brachium was  inflated to 220 mmHg.  The procedure commenced with a 2 cm incision directly  over the path of the ulnar nerve.  The subcutaneous tissues were carefully  divided taking care to spare the posterior branch of the medial  antebrachiocutaneous nerve.  The anconeus epitrochlearis muscle was  identified and the ulnar nerve identified proximal to  the muscle.  There was  a raphe between the medial head of the triceps and the anconeus  epitrochlearis that was bluntly resected. The arcuate ligament was released  and the posterior aspect of the anconeus epitrochlearis muscle followed by  excision of the anconeus epitrochlearis muscle.  The fascia at the head of  the flexor carpi ulnaris was released 6 cm over the course of the ulnar  nerve followed by gentle teasing of the muscle fibers apart to identify the  ulnar nerve and its muscle branches to the flexor carpi ulnaris.   A plexus of veins were preserved and was noncompressive.  Proximal  dissection along the nerve to the level of the arcade of Struthers was  completed releasing the fascia of the triceps.   The medial head of the triceps caused anterior roll of the nerve, therefore,  the tendinous portion of the muscle was excised over a distance of  approximately 3 cm in length and 4 mm in width.  A small  amount of triceps  muscle was removed to prevent compression with elbow flexion beyond 90  degrees.  At the completion of the procedure, the ulnar nerve was  decompressed from 7 cm proximal to the epicondyle to approximately 6 cm  distally.  There were no signs of residual entrapment.   The wound was irrigated and bleeding points were electrocauterized.  The  wound was then repaired with intradermal 3-0 Prolene and Steri-Strips.  An  Op-Site dressing was applied followed by gauze and an Ace wrap.   For aftercare, we have advised the patient to begin immediate range of  motion exercises.  He will return to our office for follow-up in one week  for dressing change and advancement to a full exercise program.                                               Katy Fitch. Naaman Plummer., M.D.    RVS/MEDQ  D:  05/24/2003  T:  05/24/2003  Job:  [id number]

## 2011-01-25 NOTE — Op Note (Signed)
Walter, Harris                          ACCOUNT NO.:  [id number]   MEDICAL RECORD NO.:  [id number]                   PATIENT TYPE:  AMB   LOCATION:  DSC                                  FACILITY:  [facility]   PHYSICIAN:  Walter Fitch. Naaman Plummer., M.D.          DATE OF BIRTH:  11-24-1945   DATE OF PROCEDURE:  05/03/2003  DATE OF DISCHARGE:                                 OPERATIVE REPORT   PREOPERATIVE DIAGNOSES:  1. 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 Shirlean Harris and clinical examination on     04/06/03.  2. Also, moderate left lateral epicondylitis without evidence of elbow     instability.   POSTOPERATIVE DIAGNOSES:  1. 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 Shirlean Harris and clinical examination on     04/06/03.  2. Also, moderate left lateral epicondylitis without evidence of elbow     instability.   OPERATION/PROCEDURE:  Exploration of left cubital tunnel with identification  of large anconeus epitrochlearis muscle followed by resection of anconeus  epitrochlearis muscle, decompression of ulnar nerve and partial resection of  medial head of triceps.   OPERATING SURGEON:  Walter Fitch. Harris, M.D.   ASSISTANT:  Walter Harris, P.A.   ANESTHESIA:  General by LMA supervised by anesthesiologist, Dr. Sondra Harris.   INDICATIONS:  Walter Harris is a 65 year old man referred by Dr. Shirlean Harris following neurosurgical evaluation of cervical degenerative disk  disease.  Dr. 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   Walter Harris was seen for an orthopedic consultation and had confirmation of a  full-thickness right rotator  cuff tear with stage III impingement and AC  arthropathy.  Clinical examination supported the diagnosis of bilateral  ulnar neuropathy at the cubital tunnel and electrode diagnostic studies  completed by Dr. Verneda Harris confirmed significant slowing of the ulnar  conductions across both elbows with conduction velocities in the low 40s.   Due to a failure to response and nonoperative measures, he is brought to the  operating room at this time for decompression of his left ulnar nerve.  Preoperatively, he was noted to have some tenderness in the medial  epicondyle and on the left and some pain provoked by extension of the elbow  and resisted pronation.  However, given his age and the fact that he is not  working at this time, in my judgment this should be able to heal to  nonoperative measures and will no be addressed surgically at this time.   DESCRIPTION OF PROCEDURE:  Walter Harris is brought to the operating room and  placed in the supine position on the operating room table.   Following the induction of general anesthesia by LMA, the left arm was  prepped with Betadine soap solution and sterilely draped.  A pneumatic  tourniquet was applied at the proximal brachium.   Following exsanguination of the left arm with an Esmarch bandage, an  arterial tourniquet on the proximal brachium was inflated to 220 mmHg.  The  procedure commenced with a standard 2 cm incision posterior to the  epicondyle.  The subcutaneous tissues were carefully divided, taking care to  identify the posterior branch of the medial antebrachial cutaneous nerve.  A  large anconeus epitrochlearis muscle measuring 1.5 cm in width and  approximately 2 cm in length was identified directly at the medial  epicondyle, completely obscuring the ulnar nerve.   The nerve was encountered approximately 2 cm above the epicondyle with  identification just distal to the arcade of Struthers'.   The ulnar nerve was decompressed  subcutaneously at the brachial level  followed by careful isolation of the nerve deep to the anconeus  epitrochlearis muscle.   The nerve was clearly compressed between the medial head of the triceps  which had a large tendon and secondary muscle bundle deep to the nerve and  the accessory anconeus muscle.   The anconeus epitrochlearis muscle was carefully isolated and resected  followed by removal of approximately 4 mm of the tendon of the medial head  of the triceps which is also causing posterior compression of the nerve.  The ulnar nerve was decompressed deep to the heads of the flexor carpi  ulnaris with multiple small tendinous bands being released under direct  vision with loupe magnification and a head light.   The nerve was stable through a range of motion, 0 to 140 degrees of elbow  flexion.   The wound was inspected for bleeding points which was electrocauterized by  bipolar current followed by irrigation and repair of the wound with  intradermal 3-0 Prolene.  A compressive dressing was applied with sterile  gauze and Op-Site followed by Ace wrap.  There were no apparent  complications.   For aftercare, Walter Harris will be advised to begin immediate range of motion  exercises.   Examination of his elbow under anesthesia revealed stability however, he did  have a 3 degree elbow flexion contracture suggesting early arthritis as a  possible explanation for some of his elbow region pain.                                                 Walter Fitch Naaman Plummer., M.D.    RVS/MEDQ  D:  05/03/2003  T:  05/03/2003  Job:  [id number]   cc:   Walter Harris, M.D.  [address]  [place]  [place] [address]  Fax: [phone]   Anesthesia

## 2011-01-29 ENCOUNTER — Ambulatory Visit: Payer: MEDICARE | Admitting: Internal Medicine

## 2011-04-01 ENCOUNTER — Ambulatory Visit: Payer: Self-pay | Admitting: Neurosurgery

## 2011-04-10 HISTORY — PX: CERVICAL FUSION: SHX112

## 2011-04-16 ENCOUNTER — Ambulatory Visit
Admission: RE | Admit: 2011-04-16 | Discharge: 2011-04-16 | Disposition: A | Discharge status: Home / Self Care | Payer: Medicare Other | Source: Ambulatory Visit | Attending: Neurosurgery | Admitting: Neurosurgery

## 2011-04-16 ENCOUNTER — Other Ambulatory Visit: Payer: Self-pay | Admitting: Neurosurgery

## 2011-04-16 DIAGNOSIS — M542 Cervicalgia: Secondary | ICD-10-CM

## 2011-04-24 ENCOUNTER — Ambulatory Visit (HOSPITAL_COMMUNITY)
Admission: RE | Admit: 2011-04-24 | Discharge: 2011-04-24 | Disposition: A | Discharge status: Home / Self Care | Payer: Medicare Other | Source: Ambulatory Visit | Attending: Neurosurgery | Admitting: Neurosurgery

## 2011-04-24 ENCOUNTER — Other Ambulatory Visit (HOSPITAL_COMMUNITY): Payer: Self-pay | Admitting: Neurosurgery

## 2011-04-24 ENCOUNTER — Encounter (HOSPITAL_COMMUNITY)
Admission: RE | Admit: 2011-04-24 | Discharge: 2011-04-24 | Disposition: A | Discharge status: Home / Self Care | Payer: Medicare Other | Source: Ambulatory Visit | Attending: Neurosurgery | Admitting: Neurosurgery

## 2011-04-24 DIAGNOSIS — M47812 Spondylosis without myelopathy or radiculopathy, cervical region: Secondary | ICD-10-CM

## 2011-04-24 DIAGNOSIS — Z01818 Encounter for other preprocedural examination: Secondary | ICD-10-CM | POA: Insufficient documentation

## 2011-04-24 DIAGNOSIS — Z01812 Encounter for preprocedural laboratory examination: Secondary | ICD-10-CM | POA: Insufficient documentation

## 2011-04-24 DIAGNOSIS — I10 Essential (primary) hypertension: Secondary | ICD-10-CM | POA: Insufficient documentation

## 2011-04-24 DIAGNOSIS — Z01810 Encounter for preprocedural cardiovascular examination: Secondary | ICD-10-CM | POA: Insufficient documentation

## 2011-04-24 LAB — BASIC METABOLIC PANEL
BUN: 14 mg/dL (ref 6–23)
Creatinine, Ser: 0.94 mg/dL (ref 0.50–1.35)
GFR calc Af Amer: >60 mL/min (ref >60)
GFR calc non Af Amer: >60 mL/min (ref >60)
Potassium: 4.8 mEq/L (ref 3.5–5.1)

## 2011-04-24 LAB — CBC
Hemoglobin: 14 g/dL (ref 13.0–17.0)
MCH: 30.6 pg (ref 26.0–34.0)
MCHC: 34.6 g/dL (ref 30.0–36.0)
Platelets: 200 10*3/uL (ref 150–400)
RBC: 4.57 MIL/uL (ref 4.22–5.81)

## 2011-04-24 LAB — SURGICAL PCR SCREEN: MRSA, PCR: NEGATIVE

## 2011-04-25 ENCOUNTER — Other Ambulatory Visit (HOSPITAL_COMMUNITY): Payer: Medicare Other

## 2011-04-29 ENCOUNTER — Other Ambulatory Visit: Payer: Self-pay | Admitting: *Deleted

## 2011-04-29 NOTE — Telephone Encounter (Signed)
Pt states he is having a lot of problems withdrawing the insulin from the bottles, he wants to go back to using pens.  He says he mostly gets air from the bottles.  I advised him to start injecting air into the bottle first, and then withdraw the insulin.  He said he will see if that works better and will call back if still having problems.

## 2011-04-30 ENCOUNTER — Ambulatory Visit (HOSPITAL_COMMUNITY)
Admission: RE | Admit: 2011-04-30 | Discharge: 2011-04-30 | Disposition: A | Discharge status: Home / Self Care | Payer: Medicare Other | Source: Ambulatory Visit | Attending: Neurosurgery | Admitting: Neurosurgery

## 2011-04-30 ENCOUNTER — Other Ambulatory Visit (HOSPITAL_COMMUNITY): Payer: Self-pay | Admitting: Neurosurgery

## 2011-04-30 DIAGNOSIS — M79609 Pain in unspecified limb: Secondary | ICD-10-CM | POA: Insufficient documentation

## 2011-04-30 DIAGNOSIS — M4120 Other idiopathic scoliosis, site unspecified: Secondary | ICD-10-CM | POA: Insufficient documentation

## 2011-04-30 DIAGNOSIS — Q762 Congenital spondylolisthesis: Secondary | ICD-10-CM | POA: Insufficient documentation

## 2011-04-30 DIAGNOSIS — M545 Low back pain, unspecified: Secondary | ICD-10-CM | POA: Insufficient documentation

## 2011-04-30 DIAGNOSIS — M5144 Schmorl's nodes, thoracic region: Secondary | ICD-10-CM | POA: Insufficient documentation

## 2011-04-30 DIAGNOSIS — Z9889 Other specified postprocedural states: Secondary | ICD-10-CM | POA: Insufficient documentation

## 2011-04-30 DIAGNOSIS — W19XXXA Unspecified fall, initial encounter: Secondary | ICD-10-CM | POA: Insufficient documentation

## 2011-04-30 DIAGNOSIS — R209 Unspecified disturbances of skin sensation: Secondary | ICD-10-CM | POA: Insufficient documentation

## 2011-04-30 DIAGNOSIS — M47817 Spondylosis without myelopathy or radiculopathy, lumbosacral region: Secondary | ICD-10-CM | POA: Insufficient documentation

## 2011-04-30 MED ORDER — GADOBENATE DIMEGLUMINE 529 MG/ML IV SOLN
20.0000 mL | Freq: Once | INTRAVENOUS | Status: AC | PRN
  Administered 2011-04-30: 20 mL via INTRAVENOUS

## 2011-05-01 ENCOUNTER — Ambulatory Visit (HOSPITAL_COMMUNITY): Payer: Medicare Other

## 2011-05-01 ENCOUNTER — Ambulatory Visit (HOSPITAL_COMMUNITY)
Admission: RE | Admit: 2011-05-01 | Discharge: 2011-05-02 | Disposition: A | Discharge status: Home / Self Care | Payer: Medicare Other | Source: Ambulatory Visit | Attending: Neurosurgery | Admitting: Neurosurgery

## 2011-05-01 DIAGNOSIS — I10 Essential (primary) hypertension: Secondary | ICD-10-CM | POA: Insufficient documentation

## 2011-05-01 DIAGNOSIS — E119 Type 2 diabetes mellitus without complications: Secondary | ICD-10-CM | POA: Insufficient documentation

## 2011-05-01 DIAGNOSIS — F319 Bipolar disorder, unspecified: Secondary | ICD-10-CM | POA: Insufficient documentation

## 2011-05-01 DIAGNOSIS — H539 Unspecified visual disturbance: Secondary | ICD-10-CM | POA: Insufficient documentation

## 2011-05-01 DIAGNOSIS — Z01818 Encounter for other preprocedural examination: Secondary | ICD-10-CM | POA: Insufficient documentation

## 2011-05-01 DIAGNOSIS — M129 Arthropathy, unspecified: Secondary | ICD-10-CM | POA: Insufficient documentation

## 2011-05-01 DIAGNOSIS — M4712 Other spondylosis with myelopathy, cervical region: Secondary | ICD-10-CM | POA: Insufficient documentation

## 2011-05-01 DIAGNOSIS — M26609 Unspecified temporomandibular joint disorder, unspecified side: Secondary | ICD-10-CM | POA: Insufficient documentation

## 2011-05-01 DIAGNOSIS — Z87891 Personal history of nicotine dependence: Secondary | ICD-10-CM | POA: Insufficient documentation

## 2011-05-01 DIAGNOSIS — I69998 Other sequelae following unspecified cerebrovascular disease: Secondary | ICD-10-CM | POA: Insufficient documentation

## 2011-05-01 DIAGNOSIS — Z01810 Encounter for preprocedural cardiovascular examination: Secondary | ICD-10-CM | POA: Insufficient documentation

## 2011-05-01 DIAGNOSIS — Z01812 Encounter for preprocedural laboratory examination: Secondary | ICD-10-CM | POA: Insufficient documentation

## 2011-05-01 LAB — GLUCOSE, CAPILLARY: Glucose-Capillary: 135 mg/dL — ABNORMAL HIGH (ref 70–99)

## 2011-05-02 LAB — GLUCOSE, CAPILLARY: Glucose-Capillary: 142 mg/dL — ABNORMAL HIGH (ref 70–99)

## 2011-05-16 ENCOUNTER — Other Ambulatory Visit: Payer: Self-pay | Admitting: *Deleted

## 2011-05-16 MED ORDER — CARISOPRODOL 350 MG PO TABS: ORAL_TABLET | ORAL | Status: DC

## 2011-05-16 NOTE — Telephone Encounter (Signed)
Form on your desk  

## 2011-05-16 NOTE — Telephone Encounter (Signed)
Rx called to [facility]. 

## 2011-05-16 NOTE — Telephone Encounter (Signed)
Okay #30 x 0 

## 2011-05-31 ENCOUNTER — Telehealth: Payer: Self-pay | Admitting: *Deleted

## 2011-05-31 NOTE — Telephone Encounter (Signed)
Pt states that since he had cervical disk surgery about 4 weeks ago he has had difficulty swallowing.  Chokes when he eats most foods, and this scares him, cuts off his air supply.  His surgeon told him this would get better, but it's not.  Please advise on what he should do.

## 2011-05-31 NOTE — Telephone Encounter (Signed)
Left message that Dr.Letvak was out of the office until Monday and that he would need to call his surgeon and let him know what's going on. I advised I would still let Dr.Letvak know.

## 2011-06-01 NOTE — Telephone Encounter (Signed)
Please check on Monday to be sure he called the surgeon

## 2011-06-03 NOTE — Telephone Encounter (Signed)
Good to hear

## 2011-06-03 NOTE — Telephone Encounter (Signed)
Spoke with pt and appt is made to see surgeon on 06/06/11. Pt said difficulty swallowing is much better: was able to eat breakfast this AM without choking.

## 2011-06-06 ENCOUNTER — Other Ambulatory Visit: Payer: Self-pay | Admitting: Neurosurgery

## 2011-06-06 ENCOUNTER — Ambulatory Visit
Admission: RE | Admit: 2011-06-06 | Discharge: 2011-06-06 | Disposition: A | Discharge status: Home / Self Care | Payer: Medicare Other | Source: Ambulatory Visit | Attending: Neurosurgery | Admitting: Neurosurgery

## 2011-06-06 DIAGNOSIS — M542 Cervicalgia: Secondary | ICD-10-CM

## 2011-06-06 NOTE — H&P (Signed)
  NAMEEVANGELOS, Walter Harris NO.:  [id number]  MEDICAL RECORD NO.:  [id number]  LOCATION:  3526                         FACILITY:  [facility]  PHYSICIAN:  Donalee Citrin, M.D.        DATE OF BIRTH:  Oct 20, 1945  DATE OF ADMISSION:  05/01/2011 DATE OF DISCHARGE:                             HISTORY & PHYSICAL   ADMISSION COMPLAINT:  Neck and bilateral shoulder pain, weakness, and numbness in his hands.  HISTORY OF PRESENT ILLNESS:  The patient is a 65 year old male with longstanding history of his neck and full back, underwent lumbar laminectomy several months ago and did very well; however, over the last several weeks to months had progressively worsening neck pain, pain in both arms, numbness and weakness in his hands.  MRI scan showed severe cervical stenosis, spinal cord compression predominantly at C6-7, but also at C4-5.  He has had no ACDF at C5-6.  Due to the patient's progressive physical exam, failure of conservative treatment, and MRI findings, the patient was recommended anterior cervical diskectomy and fusion.  PAST MEDICAL HISTORY:  Remarkable for hypertension and diabetes.  SOCIAL HISTORY:  Negative for tobacco use and occasional alcohol.  FAMILY HISTORY:  Noncontributory.  PAST SURGICAL HISTORY:  Lumbar laminectomy as well as rotator cuff surgery, elbow surgery, ear surgery, cyst in the back of his back, ACDF, appendectomy, cholesteatoma, and tonsillectomy.  The patient's medication list includes Lantus, propranolol, Lamictal, glipizide, gabapentin, and fluoxetine.  ALLERGIES:  Medication allergies are extensive; CODEINE, LITHIUM CARBONATE, METFORMIN, DIVALPROEX, ARIPIPRAZOLE, PRIMIDONE, OLANZAPINE, ZYPREXA, and __________.  PHYSICAL EXAMINATION:  On exam, the patient is very pleasant, awake, alert and oriented 65 year old gentleman in no acute distress.  HEENT is within normal limits.  Neck has decreased range of motion.  Strength is 5/5 in the  deltoid, biceps, triceps, wrist flexors, anterior iliopsoas, quads, hamstrings, gastrocs, and EHL.  No __________ sensation.  ASSESSMENT/PLAN:  This is a 65 year old gentleman who presents for anterior cervical discectomy and fusion.         ______________________________ Donalee Citrin, M.D.    GC/MEDQ  D:  05/01/2011  T:  05/01/2011  Job:  [id number]  Electronically Signed by Donalee Citrin M.D. on 06/06/2011 01:18:23 PM

## 2011-06-06 NOTE — Op Note (Signed)
Walter Harris, FOILES NO.:  [id number]  MEDICAL RECORD NO.:  [id number]  LOCATION:  3526                         FACILITY:  [facility]  PHYSICIAN:  Donalee Citrin, M.D.        DATE OF BIRTH:  1946-08-27  DATE OF PROCEDURE:  05/01/2011 DATE OF DISCHARGE:                              OPERATIVE REPORT   PREOPERATIVE DIAGNOSIS:  Cervical spondylosis with stenosis at C4-5, C6- 7.  PROCEDURE:  Anterior cervical diskectomies and fusion at C4-5 and C6-7 using Globus PEEK cages packed with locally harvested autograft mixed with morselized allograft at C4-5 and C6-7 and Globus Addition plating system, 16-mm plates with four 12-mm variable angle screws.  SURGEON:  Ronney Lion, MD  ASSISTANT:  Tia Alert, MD  ANESTHESIA:  General.  INDICATION:  The patient is a very pleasant 65 year old gentleman who has had progressive worsening neck and bilateral arm pain with weakness and numbness in his hands and fingers.  MRI scan showed severe cervical stenosis at C4-5 and C6-7 causing severe spinal cord compression predominantly at C6-7.  Due to the patient's failure to conservative treatment, MRI findings, and clinical exam, the patient was recommended anterior cervical diskectomy and fusion.  I went over the risks and benefits of the operation with him, and he understands and agreed to proceed forward.  The patient was brought to the OR, induced general anesthesia, positioned supine, and the neck flexed in extension with 5 pounds of Halter traction.  The right side of the neck was prepped and draped in usual sterile fashion.  Preoperative x-ray localized the appropriate level, so a curvilinear incision was made just off  the midline to the anterior border of the sternocleidomastoid.  The superficial layer of the platysma was dissected and divided longitudinally.  The avascular plane between the sternocleidomastoid and strap muscles was developed down to the prevertebral fascia.   Prevertebral fascia was then dissected with Kittner.  Extensive amount of scar tissue was overlying his previous C5-6 diskectomy and fusion.  Both the C4-5 and C6-7 disk spaces were identified and intraoperative x-ray identified the C4-5 disk space. The longus colli was reflected laterally and self-retaining retractor was placed.  First working at C6-7, large anterior osteophytes were bitten off with 2-mm Kerrison punch.  Disk space was then drilled down capturing the bone shavings and mucus trap, extensive drilling of the disk space down the posterior annulus osteophyte complex.  At this point, the operating microscope was directly brought into the field. Under microscopic illumination, aggressive underbiting of both endplates was carried out.  The  PLL was identified, was removed in a piecemeal fashion.  There was marked spondylosis predominantly coming from the C7 vertebral body displacing the spinal cord.  This was all aggressively under bitten, decompressing the central canal, marching across laterally to both C7 nerve roots were decompressed flushed with a pedicle.  At the end of the diskectomy,there was no further stenosis.  Meticulous hemostasis was maintained.  The wound was copiously irrigated.  Then, a 9-mm PEEK cage was packed with local autograft mixed with some morselized allograft, and this was placed approximately 1 mm deep to the anterior vertebral line and 60-mm  plate was placed.  All screws had excellent purchase.  Locking mechanism was engaged and then the retractor was repositioned at C4-5 and in a similar fashion, C4-5 was worked on. Again, capturing all the bone shavings in mucus trap, the disk space was drilled down the posterior annulus osteophyte complex. Aggressive under biting of both endplates, decompressing the central canal.  The PLL was removed in piecemeal fashion.  Marching across laterally, both C5 pedicles were identified, both C5 nerve roots  were decompressed flushed with the pedicle.  At the end of diskectomy again, there was no further stenosis either centrally or foraminally.  PEEK cage was placed after being packed with the autograft and allograft and then another 6 reamer plate was placed.  All screws had excellent purchase.  Locking mechanism was engaged.  Wound was copiously irrigated, and meticulous hemostasis was maintained.  A JP drain was placed, and the wound was closed in layers with interrupted Vicryl, and the skin was closed with a running 4-0 subcuticular.  Benzoin and Steri- Strips were applied.  The patient went to the recovery room in stable condition.  At the end of the case, all sponge and instrument counts were correct.          ______________________________ Donalee Citrin, M.D.     GC/MEDQ  D:  05/01/2011  T:  05/01/2011  Job:  [id number]  Electronically Signed by Donalee Citrin M.D. on 06/06/2011 01:18:26 PM

## 2011-06-24 ENCOUNTER — Other Ambulatory Visit: Payer: Self-pay | Admitting: *Deleted

## 2011-06-24 MED ORDER — PROPRANOLOL HCL 10 MG PO TABS: ORAL_TABLET | ORAL | Status: DC

## 2011-07-18 ENCOUNTER — Ambulatory Visit
Admission: RE | Admit: 2011-07-18 | Discharge: 2011-07-18 | Disposition: A | Discharge status: Home / Self Care | Payer: Medicare Other | Source: Ambulatory Visit | Attending: Neurosurgery | Admitting: Neurosurgery

## 2011-07-18 ENCOUNTER — Other Ambulatory Visit: Payer: Self-pay | Admitting: Neurosurgery

## 2011-07-18 DIAGNOSIS — M542 Cervicalgia: Secondary | ICD-10-CM

## 2011-07-19 ENCOUNTER — Emergency Department: Payer: Self-pay | Admitting: Emergency Medicine

## 2011-07-23 ENCOUNTER — Encounter: Payer: Self-pay | Admitting: Internal Medicine

## 2011-07-23 ENCOUNTER — Encounter: Payer: Self-pay | Admitting: Neurology

## 2011-07-23 ENCOUNTER — Ambulatory Visit (INDEPENDENT_AMBULATORY_CARE_PROVIDER_SITE_OTHER): Payer: Medicare Other | Admitting: Internal Medicine

## 2011-07-23 VITALS — BP 158/77 | HR 83 | Temp 97.6°F | Ht 70.0 in | Wt 205.0 lb

## 2011-07-23 DIAGNOSIS — R55 Syncope and collapse: Secondary | ICD-10-CM

## 2011-07-23 DIAGNOSIS — IMO0001 Reserved for inherently not codable concepts without codable children: Secondary | ICD-10-CM

## 2011-07-23 LAB — HEMOGLOBIN A1C: Hgb A1c MFr Bld: 8.5 % — ABNORMAL HIGH (ref 4.6–6.5)

## 2011-07-23 LAB — CBC WITH DIFFERENTIAL/PLATELET
Basophils Absolute: 0 10*3/uL (ref 0.0–0.1)
Eosinophils Absolute: 0.1 10*3/uL (ref 0.0–0.7)
HCT: 40.2 % (ref 39.0–52.0)
Lymphs Abs: 1.3 10*3/uL (ref 0.7–4.0)
MCHC: 34 g/dL (ref 30.0–36.0)
MCV: 93.1 fl (ref 78.0–100.0)
Monocytes Absolute: 0.5 10*3/uL (ref 0.1–1.0)
Platelets: 214 10*3/uL (ref 150.0–400.0)
RDW: 13.4 % (ref 11.5–14.6)

## 2011-07-23 NOTE — Assessment & Plan Note (Signed)
Now on the insulin Sounds like he may be better now Check A1c

## 2011-07-23 NOTE — Progress Notes (Signed)
Subjective:    Patient ID: Walter Harris, male    DOB: 10-Feb-1946, 65 y.o.   MRN: [id number]  HPI He passed out --though this has happened in the past Larey Seat back a while ago and needed new cervical disc repair by Dr Wynetta Emery He had 5 days of post operative cognitive decline  5-6 days ago, had sensation of "tingly feeling" coming up his legs Had vertigo and he he fell Not sure if he lost conciousness then Had another period of cognitive problems then---doesn't remember anything till an hour later or so Wife reported that he got up, took keys and went out driving in the car---he doesn't remember any of this. Actually drove himself to ER (but not clear that he was seen then) Wife went to get him and then he tried to walk home in just underwear No clear seizure disorder Didn't bite tongue  Has had some left neck pain to shoulder and arm Dr Wynetta Emery feels that is related to his fusion No specific chest pain No SOB  Monitoring diabetes Now up to 35 units of insulin AM sugars occ low but generally good  Current Outpatient Prescriptions on File Prior to Visit  Medication Sig Dispense Refill  . ALPRAZolam (XANAX) 0.5 MG tablet Take 1 tablet (0.5 mg total) by mouth 3 (three) times daily as needed.  90 tablet  0  . aspirin 81 MG tablet Take one by mouth once a day       . carisoprodol (SOMA) 350 MG tablet 1/2 -1 at bedtime as needed for muscle spasms  30 tablet  0  . EPINEPHrine (EPIPEN) 0.3 mg/0.3 mL DEVI Use as directed       . FLUoxetine (PROZAC) 40 MG capsule Take 40 mg by mouth daily.        . folic acid (FOLVITE) 400 MCG tablet Take 400 mcg by mouth daily.        Marland Kitchen glipiZIDE (GLUCOTROL) 10 MG tablet Take 10 mg by mouth 2 (two) times daily.        . Glucose Blood (ASCENSIA AUTODISC TEST) DISK Patient test 3 times a week       . HYDROcodone-acetaminophen (VICODIN) 5-500 MG per tablet Take 1 tablet by mouth every 6 (six) hours as needed.        . lamoTRIgine (LAMICTAL) 150 MG tablet Take 150 mg  by mouth daily.        . propranolol (INDERAL) 10 MG tablet Take one tablet by mouth two to three times daily  90 tablet  3    Allergies  Allergen Reactions  . Aripiprazole   . Codeine Phosphate     REACTION: unspecified  . Divalproex Sodium     REACTION: tremor  . Lithium Carbonate     REACTION: unspecified  . Metformin     REACTION: nausea--just couldn't tolerate  . Olanzapine     REACTION: unspecified  . Primidone     REACTION: unspecified    Past Medical History  Diagnosis Date  . Hypertension   . Diabetes mellitus   . Hyperlipidemia   . History of colonic polyps   . Bipolar 1 disorder   . OCD (obsessive compulsive disorder)   . Stuttering   . Tremor, essential   . 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 DJD (degenerative joint disease)     lumbar spine    Past Surgical History  Procedure Date  . Cervical fusion 1990  . Cholesteatoma  excision 1993  . Other surgical history     benign head tumor#5  . Other surgical history     sebaceous cysts-post neck x5  . Appendectomy 1960  . External ear surgery     multiple ear surguries as a child  . Tonsillectomy   . Collateral ligament repair, elbow     bilateral, nerve improvement left 08/04, right 09/04  . Shoulder arthroscopy w/ acromial repair 08/2004    left shoulder  . Cervical fusion 8/12    multiple levels with plates    Family History  Problem Relation Age of Onset  . OCD Mother   . Bipolar disorder Mother   . Cancer Mother   . OCD Sister   . OCD Sister     History   Social History  . Marital Status: Married    Spouse Name: N/A    Number of Children: 1  . Years of Education: N/A   Occupational History  . disabled    Social History Main Topics  . Smoking status: Former Games developer  . Smokeless tobacco: Never Used  . Alcohol Use: No  . Drug Use: Not on file  . Sexually Active: Not on file   Other Topics Concern  . Not on file   Social History Narrative  . 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 No distress.  Neck: No thyromegaly present.       No carotid bruits  Cardiovascular: Normal rate, regular rhythm, normal heart sounds and intact distal pulses.  Exam reveals no gallop.   No murmur heard. Pulmonary/Chest: Effort normal and breath sounds normal. No respiratory distress. He has no wheezes. He has no rales.  Abdominal: Soft. There is no tenderness.  Musculoskeletal: He exhibits no edema and no tenderness.  Lymphadenopathy:    He has no cervical adenopathy.  Psychiatric:       Slightly manic but controlled No depression Appropriate interaction          Assessment & Plan:

## 2011-07-23 NOTE — Patient Instructions (Signed)
Please set up neurology appointment

## 2011-07-23 NOTE — Assessment & Plan Note (Signed)
Has had several spells over time but this was different Sensory prodrome in legs and then vertigo Post event amnesia and erratic behavior  Doesn't sound at all cardiac. EKG shows sinus with ?1 blocked PAC. No ischemia  ?seizure ?brain stem ischemia Could still have been vaso vagal but doesn't really sound like that  P: check labs      Neurology eval

## 2011-07-24 LAB — HEPATIC FUNCTION PANEL
AST: 23 U/L (ref 0–37)
Albumin: 3.8 g/dL (ref 3.5–5.2)

## 2011-07-24 LAB — LDL CHOLESTEROL, DIRECT: Direct LDL: 152.7 mg/dL

## 2011-07-24 LAB — TSH: TSH: 0.85 u[IU]/mL (ref 0.35–5.50)

## 2011-07-24 LAB — BASIC METABOLIC PANEL
BUN: 14 mg/dL (ref 6–23)
Chloride: 102 mEq/L (ref 96–112)
Glucose, Bld: 314 mg/dL — ABNORMAL HIGH (ref 70–99)
Potassium: 4.7 mEq/L (ref 3.5–5.1)

## 2011-07-24 LAB — LIPID PANEL: Cholesterol: 202 mg/dL — ABNORMAL HIGH (ref 0–200)

## 2011-08-20 ENCOUNTER — Ambulatory Visit: Payer: Medicare Other | Admitting: Neurology

## 2011-08-27 ENCOUNTER — Ambulatory Visit: Payer: Medicare Other | Admitting: Internal Medicine

## 2011-08-28 ENCOUNTER — Telehealth: Payer: Self-pay | Admitting: Internal Medicine

## 2011-08-28 NOTE — Telephone Encounter (Signed)
Patient's wife called and stated that patient needs a letter to excuse him from jury duty because he is physically unable due to two back surgeries and he is deaf in one ear and 85% deaf in the other.  Please call when letter is ready.

## 2011-08-29 ENCOUNTER — Encounter: Payer: Self-pay | Admitting: Internal Medicine

## 2011-08-29 DIAGNOSIS — Z0279 Encounter for issue of other medical certificate: Secondary | ICD-10-CM

## 2011-08-29 NOTE — Telephone Encounter (Signed)
Letter done $20 charge 

## 2011-08-29 NOTE — Telephone Encounter (Signed)
Patient's wife will pick up letter and I notified her of the $20 charge.

## 2011-09-17 ENCOUNTER — Other Ambulatory Visit: Payer: Self-pay | Admitting: *Deleted

## 2011-09-17 MED ORDER — GLIPIZIDE 10 MG PO TABS: 10.0000 mg | ORAL_TABLET | Freq: Two times a day (BID) | ORAL | Status: DC

## 2011-09-19 ENCOUNTER — Ambulatory Visit
Admission: RE | Admit: 2011-09-19 | Discharge: 2011-09-19 | Disposition: A | Discharge status: Home / Self Care | Payer: Medicare Other | Source: Ambulatory Visit | Attending: Neurosurgery | Admitting: Neurosurgery

## 2011-09-19 ENCOUNTER — Other Ambulatory Visit: Payer: Self-pay | Admitting: Neurosurgery

## 2011-09-19 DIAGNOSIS — M542 Cervicalgia: Secondary | ICD-10-CM

## 2011-09-25 ENCOUNTER — Encounter: Payer: Self-pay | Admitting: Neurosurgery

## 2011-10-11 ENCOUNTER — Encounter: Payer: Self-pay | Admitting: Neurosurgery

## 2011-10-11 ENCOUNTER — Encounter: Payer: Self-pay | Admitting: Internal Medicine

## 2011-10-11 ENCOUNTER — Ambulatory Visit (INDEPENDENT_AMBULATORY_CARE_PROVIDER_SITE_OTHER): Payer: Medicare Other | Admitting: Internal Medicine

## 2011-10-11 VITALS — BP 150/80 | HR 76 | Temp 98.5°F | Ht 70.0 in | Wt 208.0 lb

## 2011-10-11 DIAGNOSIS — C4491 Basal cell carcinoma of skin, unspecified: Secondary | ICD-10-CM | POA: Insufficient documentation

## 2011-10-11 DIAGNOSIS — R140 Abdominal distension (gaseous): Secondary | ICD-10-CM

## 2011-10-11 DIAGNOSIS — R142 Eructation: Secondary | ICD-10-CM

## 2011-10-11 NOTE — Progress Notes (Signed)
Subjective:    Patient ID: Walter Harris, male    DOB: Mar 21, 1946, 66 y.o.   MRN: [id number]  HPI Started with stomach trouble last week Feels bloated in epigastrium mostly Not really pain--like a "consciousness"----tight feeling Gas-x helps Burping and flactulence  Appetite is fine---normal for him Stool has been soft mostly Goes 3-4 times per day  Checking sugars once a day Rare sugars in 50's--woke up feeling bad Average in 120-130 range  1 diet Mt Dew daily No sugar free candies, etc Lots of equate in coffee Drinks milk intermittently  Current Outpatient Prescriptions on File Prior to Visit  Medication Sig Dispense Refill  . aspirin 81 MG tablet Take one by mouth once a day       . EPINEPHrine (EPIPEN) 0.3 mg/0.3 mL DEVI Use as directed       . FLUoxetine (PROZAC) 40 MG capsule Take 40 mg by mouth daily.        Marland Kitchen glipiZIDE (GLUCOTROL) 10 MG tablet Take 1 tablet (10 mg total) by mouth 2 (two) times daily.  60 tablet  1  . Glucose Blood (ASCENSIA AUTODISC TEST) DISK Patient test 3 times a week       . insulin glargine (LANTUS) 100 UNIT/ML injection Inject 35 Units into the skin at bedtime.        . lamoTRIgine (LAMICTAL) 150 MG tablet Take 150 mg by mouth daily.        . propranolol (INDERAL) 10 MG tablet Take one tablet by mouth two to three times daily  90 tablet  3    Allergies  Allergen Reactions  . Aripiprazole   . Codeine Phosphate     REACTION: unspecified  . Divalproex Sodium     REACTION: tremor  . Lithium Carbonate     REACTION: unspecified  . Metformin     REACTION: nausea--just couldn't tolerate  . Olanzapine     REACTION: unspecified  . Primidone     REACTION: unspecified    Past Medical History  Diagnosis Date  . Hypertension   . Diabetes mellitus   . Hyperlipidemia   . History of colonic polyps   . Bipolar 1 disorder   . OCD (obsessive compulsive disorder)   . Stuttering   . Tremor, essential   . 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 DJD (degenerative joint disease)     lumbar spine    Past Surgical History  Procedure Date  . Cervical fusion 1990  . Cholesteatoma excision 1993  . Other surgical history     benign head tumor#5  . Other surgical history     sebaceous cysts-post neck x5  . Appendectomy 1960  . External ear surgery     multiple ear surguries as a child  . Tonsillectomy   . Collateral ligament repair, elbow     bilateral, nerve improvement left 08/04, right 09/04  . Shoulder arthroscopy w/ acromial repair 08/2004    left shoulder  . Cervical fusion 8/12    multiple levels with plates    Family History  Problem Relation Age of Onset  . OCD Mother   . Bipolar disorder Mother   . Cancer Mother   . OCD Sister   . OCD Sister     History   Social History  . Marital Status: Married    Spouse Name: N/A    Number of Children: 1  . Years of Education: N/A   Occupational History  .  disabled    Social History Main Topics  . Smoking status: Former Games developer  . Smokeless tobacco: Never Used  . Alcohol Use: No  . Drug Use: Not on file  . Sexually Active: Not on file   Other Topics Concern  . Not on file   Social History Narrative  . No narrative on file   Review of Systems no fever no nausea or vomiting   Objective:   Physical Exam  Constitutional: He appears well-developed and well-nourished. No distress.  Neck: Normal range of motion. Neck supple. No thyromegaly present.  Cardiovascular: Normal rate, regular rhythm and normal heart sounds.  Exam reveals no gallop.   No murmur heard. Pulmonary/Chest: Effort normal and breath sounds normal. No respiratory distress. He has no wheezes. He has no rales.  Abdominal: Soft. Bowel sounds are normal. He exhibits distension. He exhibits no mass. There is no tenderness. There is no rebound and no guarding.  Musculoskeletal: He exhibits no edema and no tenderness.  Lymphadenopathy:    He has no cervical adenopathy.  Psychiatric: He has a  normal mood and affect. His behavior is normal.          Assessment & Plan:

## 2011-10-11 NOTE — Assessment & Plan Note (Signed)
Has been creeping up the insulin Sugars seem better Will recheck

## 2011-10-11 NOTE — Assessment & Plan Note (Addendum)
New problem Doesn't look bad and no worrisome features Discussed dietary changes  May want to try probiotic No testing indicated  If persists, will consider GI eval

## 2011-10-11 NOTE — Patient Instructions (Signed)
Please stop all artificial sweeteners Try lactaid milk Try an over the counter probiotic

## 2011-10-18 ENCOUNTER — Encounter: Payer: Self-pay | Admitting: *Deleted

## 2011-10-18 ENCOUNTER — Telehealth: Payer: Self-pay | Admitting: *Deleted

## 2011-10-18 NOTE — Telephone Encounter (Signed)
Patient called back VERY upset about his lab results, I tried to explain to patient that the diabetes class would be good for him but pt stated that they class only taught him how to cook and its not helping. Pt went to the classes years ago, and I advised things have changed and they can help him a lot. Per pt he is will to try the classes again.

## 2011-10-19 NOTE — Telephone Encounter (Signed)
If he doesn't think it will help, we can hold off on the classes. He may want to join weight watchers or try other controlled eating plan  Have him recheck HgbA1c in about 2 months to be sure he is improving

## 2011-10-21 NOTE — Telephone Encounter (Signed)
Spoke with patient and he would like to try the classes again, he seems to think they might, he was asking exactly what they would be doing in the classes? I advised I didn't know, he could try to call and see?

## 2011-10-21 NOTE — Telephone Encounter (Signed)
Maybe he would like to try at [facility] with their [facility]

## 2011-10-22 NOTE — Telephone Encounter (Signed)
Spoke with patient would like [facility] if possible.

## 2011-11-04 ENCOUNTER — Telehealth: Payer: Self-pay | Admitting: Internal Medicine

## 2011-11-04 NOTE — Telephone Encounter (Signed)
I thought I had already told him that I would prescribe his psychiatric meds I might have to ask for help if he gets into much worsened symptoms

## 2011-11-04 NOTE — Telephone Encounter (Signed)
To: [facility] (Daytime Triage) Fax: [phone] From: [facility] Date/ Time: 11/04/2011 4:20 PM Taken By: Jeraldine Loots, RN Caller: Christen Bame Facility: not collected Patient: Walter Harris, Walter Harris DOB: 1946-05-27 Phone: [phone] Reason for Call: Pt. Wants to know if Dr. Alphonsus Sias will take over her psychiatric medication and management. She isn't satisfied with her current psychiatrist and his care. Regarding Appointment: Appt Date: Appt Time: Unknown Provider: Reason: Details: Outcome:

## 2011-11-04 NOTE — Telephone Encounter (Signed)
Spoke with patient and advised results   

## 2011-11-14 ENCOUNTER — Other Ambulatory Visit: Payer: Self-pay | Admitting: *Deleted

## 2011-11-14 MED ORDER — GLIPIZIDE 10 MG PO TABS: 10.0000 mg | ORAL_TABLET | Freq: Two times a day (BID) | ORAL | Status: DC

## 2011-11-14 MED ORDER — PROPRANOLOL HCL 10 MG PO TABS: ORAL_TABLET | ORAL | Status: DC

## 2011-11-15 ENCOUNTER — Ambulatory Visit (INDEPENDENT_AMBULATORY_CARE_PROVIDER_SITE_OTHER): Payer: Medicare Other | Admitting: Family Medicine

## 2011-11-15 ENCOUNTER — Ambulatory Visit (INDEPENDENT_AMBULATORY_CARE_PROVIDER_SITE_OTHER)
Admission: RE | Admit: 2011-11-15 | Discharge: 2011-11-15 | Disposition: A | Discharge status: Home / Self Care | Payer: Medicare Other | Source: Ambulatory Visit | Attending: Family Medicine | Admitting: Family Medicine

## 2011-11-15 ENCOUNTER — Encounter: Payer: Self-pay | Admitting: Family Medicine

## 2011-11-15 VITALS — BP 130/80 | HR 87 | Temp 97.8°F | Ht 68.5 in | Wt 209.1 lb

## 2011-11-15 DIAGNOSIS — J209 Acute bronchitis, unspecified: Secondary | ICD-10-CM

## 2011-11-15 MED ORDER — ALBUTEROL SULFATE HFA 108 (90 BASE) MCG/ACT IN AERS: 2.0000 | INHALATION_SPRAY | Freq: Four times a day (QID) | RESPIRATORY_TRACT | Status: DC | PRN

## 2011-11-15 MED ORDER — AZITHROMYCIN 250 MG PO TABS: ORAL_TABLET | ORAL | Status: DC

## 2011-11-15 MED ORDER — HYDROCODONE-HOMATROPINE 5-1.5 MG/5ML PO SYRP: 5.0000 mL | ORAL_SOLUTION | Freq: Every evening | ORAL | Status: DC | PRN

## 2011-11-15 NOTE — Patient Instructions (Signed)
You have bronchitis. Use medication as prescribed: take zpack as well as hydrocodone cough syrup at night time (may make you sleepy so don't drive with it).  Use albuterol sample as needed for cough or shortness of breath. Push fluids and plenty of rest. Please return if you are not improving as expected, or if you have high fevers (>101.5) or difficulty swallowing or worsening productive cough. Call clinic with questions.  Good to see you today.

## 2011-11-15 NOTE — Assessment & Plan Note (Addendum)
Treat with zpack as well as albuterol and hydrocodone cough syrup for night time. Supportive care as per instructions Xray today given duration of sxs - ok on my read. Consider GERD exacerbating if not improving as expected.

## 2011-11-15 NOTE — Progress Notes (Signed)
  Subjective:    Patient ID: Walter Harris, male    DOB: [birth year], 66 y.o.   MRN: [id number]  HPI CC: cough  Cough off and on for 2-3 months now.  Lately worsening over last month. Hurting in ribcage from coughing so much.  Cough dry.  Mild congestion, trouble taking deep breath at times.  Coughing comes in fits, worse when laying down at night.  + nasal congestion and RN.  So far hasn't tried anything for this.  + h/o GERD worse with [place] food. + h/o seasonal allergies worse in spring.  No fevers/chills.  No abd pain, n/v, rashes, ear pain, tooth pain.   No chest pain, headaches, SOB, leg swelling.  Recent skin surgeries for skin cancer.  No smokers at home.  Pt quit 2005 No h/o asthma.  Wt Readings from Last 3 Encounters:  11/15/11 209 lb 1.9 oz (94.856 kg)  10/11/11 208 lb (94.348 kg)  07/23/11 205 lb (92.987 kg)   Review of Systems Per HPI    Objective:   Physical Exam  Nursing note and vitals reviewed. Constitutional: He appears well-developed and well-nourished. No distress.  HENT:  Head: Normocephalic and atraumatic.  Right Ear: Tympanic membrane, external ear and ear canal normal. Decreased hearing is noted.  Left Ear: Tympanic membrane, external ear and ear canal normal. Decreased hearing is noted.  Nose: Nose normal. No mucosal edema or rhinorrhea. Right sinus exhibits no maxillary sinus tenderness and no frontal sinus tenderness. Left sinus exhibits no maxillary sinus tenderness and no frontal sinus tenderness.  Mouth/Throat: Oropharynx is clear and moist. No oropharyngeal exudate.       Ear tube on right  Eyes: Conjunctivae and EOM are normal. Pupils are equal, round, and reactive to light. No scleral icterus.  Neck: Normal range of motion. Neck supple.  Cardiovascular: Normal rate, regular rhythm, normal heart sounds and intact distal pulses.   No murmur heard. Pulmonary/Chest: Effort normal. No respiratory distress. He has no wheezes. He has no rales.       Decreased breath sounds LLL, crackles bibasilarly  Lymphadenopathy:    He has no cervical adenopathy.  Skin: Skin is warm and dry. No rash noted.       Assessment & Plan:

## 2011-11-20 ENCOUNTER — Ambulatory Visit (INDEPENDENT_AMBULATORY_CARE_PROVIDER_SITE_OTHER): Payer: Medicare Other | Admitting: Family Medicine

## 2011-11-20 ENCOUNTER — Encounter: Payer: Self-pay | Admitting: Family Medicine

## 2011-11-20 VITALS — BP 136/86 | HR 80 | Temp 97.8°F | Wt 210.8 lb

## 2011-11-20 DIAGNOSIS — J209 Acute bronchitis, unspecified: Secondary | ICD-10-CM

## 2011-11-20 MED ORDER — BENZONATATE 100 MG PO CAPS: 100.0000 mg | ORAL_CAPSULE | Freq: Two times a day (BID) | ORAL | Status: DC | PRN

## 2011-11-20 MED ORDER — HYDROCOD POLST-CHLORPHEN POLST 10-8 MG/5ML PO LQCR: 5.0000 mL | Freq: Every evening | ORAL | Status: DC | PRN

## 2011-11-20 NOTE — Progress Notes (Signed)
  Subjective:    Patient ID: Walter Harris, male    DOB: [birth year], 66 y.o.   MRN: [id number]  HPI CC: f/u, not better  Seen here 11/15/2011 with dx bronchitis, treated with zpack, albuterol, mucinex and hycodan (which pt didn't fill, unsure if received although this was printed out at last visit).  CXR last visit - no acute process.    Inhaler helping some.  Also finished zpack yesterday.  Actually started feeling a little better yesterday.  Main concern is continued ribcage soreness as well as back pain worse with coughing.    No fevers/chills, SOB, abd pain, upper chest pain.  Now wife sick.  Review of Systems Per HPI    Objective:   Physical Exam  Nursing note and vitals reviewed. Constitutional: He appears well-developed and well-nourished. No distress.  HENT:  Head: Normocephalic and atraumatic.  Mouth/Throat: Oropharynx is clear and moist. No oropharyngeal exudate.  Eyes: Conjunctivae and EOM are normal. Pupils are equal, round, and reactive to light. No scleral icterus.  Cardiovascular: Normal rate, regular rhythm, normal heart sounds and intact distal pulses.   No murmur heard. Pulmonary/Chest: Effort normal and breath sounds normal. No respiratory distress. He has no wheezes. He has no rales. He exhibits tenderness.       Shallow breathing 2/2 aversion to cough but able to breathe deeply and no noted wheezing, rales. Diffuse tenderness bilateral anterior lower ribcage to palpation. + tender to palpation as well bilateral inferior to shoulderblades  Musculoskeletal: He exhibits no edema.  Skin: Skin is warm and dry. No rash noted.  Psychiatric: He has a normal mood and affect.       Assessment & Plan:

## 2011-11-20 NOTE — Patient Instructions (Signed)
I think your cough is improving, but still aggravated chest wall.  Likely ribcage strain. Treat with tussionex for cough - watch as it may make you sleepy. Take tessalon perls for cough during day (swallow, don't chew). Update [place] if not improving.

## 2011-11-20 NOTE — Assessment & Plan Note (Signed)
Feeling better regarding congestion, but continued cough. States never received hycodan cough syrup script. Anticipate diffuse muscle spasm/strain from 2-3 mo h/o coughing. Will treat with tussionex and tessalon perls. Discussed option of oral pain med (like tramadol) but will start with above.  If not better, let us know for tramadol script.

## 2011-11-25 ENCOUNTER — Ambulatory Visit (INDEPENDENT_AMBULATORY_CARE_PROVIDER_SITE_OTHER): Payer: Medicare Other | Admitting: Internal Medicine

## 2011-11-25 ENCOUNTER — Encounter: Payer: Self-pay | Admitting: Internal Medicine

## 2011-11-25 VITALS — BP 140/78 | HR 86 | Temp 98.2°F | Wt 209.0 lb

## 2011-11-25 DIAGNOSIS — R071 Chest pain on breathing: Secondary | ICD-10-CM

## 2011-11-25 DIAGNOSIS — J209 Acute bronchitis, unspecified: Secondary | ICD-10-CM

## 2011-11-25 DIAGNOSIS — R0789 Other chest pain: Secondary | ICD-10-CM

## 2011-11-25 MED ORDER — BENZONATATE 100 MG PO CAPS: 100.0000 mg | ORAL_CAPSULE | Freq: Three times a day (TID) | ORAL | Status: AC | PRN

## 2011-11-25 MED ORDER — PREDNISONE 20 MG PO TABS: 40.0000 mg | ORAL_TABLET | Freq: Every day | ORAL | Status: AC

## 2011-11-25 NOTE — Assessment & Plan Note (Signed)
Ongoing Still with coarse cough Will treat with prednisone Same cough meds

## 2011-11-25 NOTE — Assessment & Plan Note (Signed)
Still with some wheezing Albuterol some help but will use 10 day course of prednisone

## 2011-11-25 NOTE — Progress Notes (Signed)
Subjective:    Patient ID: Walter Harris, male    DOB: 1946-09-09, 66 y.o.   MRN: [id number]  HPI "I just can't shake this thing" Some better Ongoing pain around ribs No rash  Some cough--some better Had sense of losing breath completely yesterday with coughing spell Cough med does help some---tessalon in day and syrup at night  Is using a band around chest for compression---helps some  No fever Has uses the albuterol and it helps wheezing  Current Outpatient Prescriptions on File Prior to Visit  Medication Sig Dispense Refill  . albuterol (VENTOLIN HFA) 108 (90 BASE) MCG/ACT inhaler Inhale 2 puffs into the lungs every 6 (six) hours as needed for wheezing.  1 Inhaler  1  . aspirin 81 MG tablet Take one by mouth once a day       . benzonatate (TESSALON) 100 MG capsule Take 1 capsule (100 mg total) by mouth 2 (two) times daily as needed for cough.  30 capsule  0  . chlorpheniramine-HYDROcodone (TUSSIONEX) 10-8 MG/5ML LQCR Take 5 mLs by mouth at bedtime as needed. Sedation precautions  140 mL  0  . EPINEPHrine (EPIPEN) 0.3 mg/0.3 mL DEVI Use as directed       . FLUoxetine (PROZAC) 40 MG capsule Take 40 mg by mouth daily.        Marland Kitchen glipiZIDE (GLUCOTROL) 10 MG tablet Take 1 tablet (10 mg total) by mouth 2 (two) times daily.  60 tablet  1  . Glucose Blood (ASCENSIA AUTODISC TEST) DISK Patient test 3 times a week       . insulin glargine (LANTUS) 100 UNIT/ML injection Inject 45 Units into the skin at bedtime.       . lamoTRIgine (LAMICTAL) 100 MG tablet Take 100 mg by mouth 2 (two) times daily.      . propranolol (INDERAL) 10 MG tablet Take one tablet by mouth two to three times daily  90 tablet  3    Allergies  Allergen Reactions  . Aripiprazole   . Codeine Phosphate     REACTION: unspecified  . Divalproex Sodium     REACTION: tremor  . Lithium Carbonate     REACTION: unspecified  . Metformin     REACTION: nausea--just couldn't tolerate  . Olanzapine     REACTION: unspecified    . Primidone     REACTION: unspecified    Past Medical History  Diagnosis Date  . Hypertension   . Diabetes mellitus   . Hyperlipidemia   . History of colonic polyps   . Bipolar 1 disorder   . OCD (obsessive compulsive disorder)   . Stuttering   . Tremor, essential   . 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 DJD (degenerative joint disease)     lumbar spine  . Basal cell carcinoma     multiple    Past Surgical History  Procedure Date  . Cervical fusion 1990  . Cholesteatoma excision 1993  . Other surgical history     benign head tumor#5  . Other surgical history     sebaceous cysts-post neck x5  . Appendectomy 1960  . External ear surgery     multiple ear surguries as a child  . Tonsillectomy   . Collateral ligament repair, elbow     bilateral, nerve improvement left 08/04, right 09/04  . Shoulder arthroscopy w/ acromial repair 08/2004    left shoulder  . Cervical fusion 8/12    multiple  levels with plates    Family History  Problem Relation Age of Onset  . OCD Mother   . Bipolar disorder Mother   . Cancer Mother   . OCD Sister   . OCD Sister     History   Social History  . Marital Status: Married    Spouse Name: N/A    Number of Children: 1  . Years of Education: N/A   Occupational History  . disabled    Social History Main Topics  . Smoking status: Former Games developer  . Smokeless tobacco: Never Used  . Alcohol Use: No  . Drug Use: Not on file  . Sexually Active: Not on file   Other Topics Concern  . Not on file   Social History Narrative  . No narrative on file   Review of Systems No vomiting or diarrhea Sugars were consistently under 100 until starting seroquel from Dr Imogene Burn. May be helping the bipolar somewhat (had "lost it" after he got scammed for $3000)    Objective:   Physical Exam  Constitutional: He appears well-developed and well-nourished. No distress.  Cardiovascular: Normal rate, regular rhythm and normal heart sounds.  Exam  reveals no gallop.   No murmur heard. Pulmonary/Chest: Effort normal. No respiratory distress. He has wheezes. He exhibits no tenderness.       occ coarse cough No crackles and not particularly tight but does have slight exp wheezes          Assessment & Plan:

## 2011-12-10 ENCOUNTER — Other Ambulatory Visit: Payer: Self-pay | Admitting: *Deleted

## 2011-12-10 MED ORDER — GLUCOSE BLOOD VI STRP: ORAL_STRIP | Status: DC

## 2011-12-13 ENCOUNTER — Other Ambulatory Visit: Payer: Self-pay | Admitting: *Deleted

## 2011-12-13 MED ORDER — INSULIN GLARGINE 100 UNIT/ML ~~LOC~~ SOLN: 45.0000 [IU] | Freq: Every day | SUBCUTANEOUS | Status: DC

## 2011-12-30 ENCOUNTER — Encounter: Payer: Self-pay | Admitting: Internal Medicine

## 2011-12-30 ENCOUNTER — Telehealth: Payer: Self-pay | Admitting: Internal Medicine

## 2011-12-30 ENCOUNTER — Ambulatory Visit (INDEPENDENT_AMBULATORY_CARE_PROVIDER_SITE_OTHER): Payer: Medicare Other | Admitting: Internal Medicine

## 2011-12-30 VITALS — BP 112/78 | HR 86 | Temp 98.1°F | Wt 205.0 lb

## 2011-12-30 DIAGNOSIS — J209 Acute bronchitis, unspecified: Secondary | ICD-10-CM

## 2011-12-30 MED ORDER — MONTELUKAST SODIUM 10 MG PO TABS: 10.0000 mg | ORAL_TABLET | Freq: Every day | ORAL | Status: DC

## 2011-12-30 MED ORDER — PREDNISONE 20 MG PO TABS: 40.0000 mg | ORAL_TABLET | Freq: Every day | ORAL | Status: DC

## 2011-12-30 NOTE — Telephone Encounter (Signed)
Please check to see how he is doing

## 2011-12-30 NOTE — Progress Notes (Signed)
Subjective:    Patient ID: Walter Harris, male    DOB: 1946/01/06, 66 y.o.   MRN: [id number]  HPI Got over last illness  Started feeling bad again about 4 days ago Started with barky cough--thought it was pollen Has tried the albuterol--not clearly helpful Not much wheezing but bad cough if taking bad breath Has paroxysms of cough again  No problems with tree pollen in past---or maybe just mild rhinorrhea Has tried zyrtec in past--maybe helped a little over the past few days  Able to breathe a little deeper today--had to be very shallow No fever Hot and cold but no night sweats ?slight sore throat  OTC tussin syrup has helped some  Current Outpatient Prescriptions on File Prior to Visit  Medication Sig Dispense Refill  . albuterol (VENTOLIN HFA) 108 (90 BASE) MCG/ACT inhaler Inhale 2 puffs into the lungs every 6 (six) hours as needed for wheezing.  1 Inhaler  1  . aspirin 81 MG tablet Take one by mouth once a day       . chlorpheniramine-HYDROcodone (TUSSIONEX) 10-8 MG/5ML LQCR Take 5 mLs by mouth at bedtime as needed. Sedation precautions  140 mL  0  . EPINEPHrine (EPIPEN) 0.3 mg/0.3 mL DEVI Use as directed       . FLUoxetine (PROZAC) 40 MG capsule Take 40 mg by mouth daily.        Marland Kitchen glipiZIDE (GLUCOTROL) 10 MG tablet Take 1 tablet (10 mg total) by mouth 2 (two) times daily.  60 tablet  1  . glucose blood (BAYER CONTOUR TEST) test strip Use as instructed to test blood sugar 3 times daily, dx: 250.02  100 each  6  . insulin glargine (LANTUS) 100 UNIT/ML injection Inject 45 Units into the skin at bedtime.  10 mL  3  . lamoTRIgine (LAMICTAL) 100 MG tablet Take 100 mg by mouth 2 (two) times daily.      . propranolol (INDERAL) 10 MG tablet Take one tablet by mouth two to three times daily  90 tablet  3  . QUEtiapine (SEROQUEL) 50 MG tablet Take 50 mg by mouth 2 (two) times daily.         Allergies  Allergen Reactions  . Aripiprazole   . Codeine Phosphate     REACTION:  unspecified  . Divalproex Sodium     REACTION: tremor  . Lithium Carbonate     REACTION: unspecified  . Metformin     REACTION: nausea--just couldn't tolerate  . Olanzapine     REACTION: unspecified  . Primidone     REACTION: unspecified    Past Medical History  Diagnosis Date  . Hypertension   . Diabetes mellitus   . Hyperlipidemia   . History of colonic polyps   . Bipolar 1 disorder   . OCD (obsessive compulsive disorder)   . Stuttering   . Tremor, essential   . 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 DJD (degenerative joint disease)     lumbar spine  . Basal cell carcinoma     multiple    Past Surgical History  Procedure Date  . Cervical fusion 1990  . Cholesteatoma excision 1993  . Other surgical history     benign head tumor#5  . Other surgical history     sebaceous cysts-post neck x5  . Appendectomy 1960  . External ear surgery     multiple ear surguries as a child  . Tonsillectomy   . Collateral  ligament repair, elbow     bilateral, nerve improvement left 08/04, right 09/04  . Shoulder arthroscopy w/ acromial repair 08/2004    left shoulder  . Cervical fusion 8/12    multiple levels with plates    Family History  Problem Relation Age of Onset  . OCD Mother   . Bipolar disorder Mother   . Cancer Mother   . OCD Sister   . OCD Sister     History   Social History  . Marital Status: Married    Spouse Name: N/A    Number of Children: 1  . Years of Education: N/A   Occupational History  . disabled    Social History Main Topics  . Smoking status: Former Games developer  . Smokeless tobacco: Never Used  . Alcohol Use: No  . Drug Use: Not on file  . Sexually Active: Not on file   Other Topics Concern  . Not on file   Social History Narrative  . No narrative on file   Review of Systems No rash  No vomiting or diarrhea Has cut back on portions---weight down slightly and sugars some  better   Objective:   Physical Exam  Constitutional: He  appears well-developed and well-nourished. No distress.       Coarse, harsh cough  HENT:        No sinus tenderness Mild nasal congestion TMs normal  Neck: Normal range of motion. Neck supple.  Pulmonary/Chest: Effort normal. No respiratory distress. He has wheezes. He has no rales.       Tight cough Prolonged exp phase with some wheezing  Lymphadenopathy:    He has no cervical adenopathy.          Assessment & Plan:

## 2011-12-30 NOTE — Telephone Encounter (Signed)
Triage Record Num: [id number] Operator: April Finney Patient Name: Walter Harris Call Date & Time: 12/28/2011 3:30:21PM Patient Phone: [phone] PCP: Tillman Abide Patient Gender: Male PCP Fax : [phone] Patient DOB: 12-14-1945 Practice Name: [facility] Reason for Call: Caller: [place]/Patient; PCP: Tillman Abide I.; CB#: [phone]; Call regarding Cough/Congestion; Calling to see if a steroid and antibiotic could be called in without being seen. Instructed him no. Offered triage and he declined. He is going to walk in clinic instead. Protocol(s) Used: Office Note Recommended Outcome per Protocol: Information Noted and Sent to Office Reason for Outcome: Caller information to office Care Advice: ~ 12/28/2011 3:40:51PM Page 1 of 1 [email or web address]

## 2011-12-30 NOTE — Assessment & Plan Note (Addendum)
Recurrent May have developed asthma---concern for recurrent bronchospasm Will treat again with prednisone eval by pulmonary  No antibiotic unless cough becomes more productive Will try montelukast

## 2011-12-30 NOTE — Patient Instructions (Signed)
Please take the prednisone 2 daily for the next 5 days and then 1 daily for the next 5 days Start the new med montelukast daily also  Call if your cough becomes productive

## 2011-12-30 NOTE — Telephone Encounter (Signed)
Patient on your schedule today at 11:00am

## 2012-01-06 ENCOUNTER — Ambulatory Visit (INDEPENDENT_AMBULATORY_CARE_PROVIDER_SITE_OTHER): Payer: Medicare Other | Admitting: Internal Medicine

## 2012-01-06 ENCOUNTER — Encounter: Payer: Self-pay | Admitting: Internal Medicine

## 2012-01-06 VITALS — BP 120/80 | HR 86 | Temp 98.0°F | Wt 203.0 lb

## 2012-01-06 DIAGNOSIS — J209 Acute bronchitis, unspecified: Secondary | ICD-10-CM

## 2012-01-06 NOTE — Assessment & Plan Note (Signed)
Tried albuterol inhaler and may have helped some but not a lot Seems like he may have more of an upper airway irritation Will stop the montelukast Start omeprazole Push up pulm eval if possible

## 2012-01-06 NOTE — Patient Instructions (Signed)
Please stop the montelukast  Please start omeprazole 20mg  (over the counter) at bedtime  Please try to push up pulmonary appointment by having 1st appt at [facility]. NP/PA is fine

## 2012-01-06 NOTE — Progress Notes (Signed)
Subjective:    Patient ID: Walter Harris, male    DOB: [birth year], 66 y.o.   MRN: [id number]  HPI Same problems Took the last prednisone this AM---may have helped some but not clear cut Using the montelukast---no clearly helpful  Still notes cough and wheezing are still more at night Benzonatate do help some  No fever Cough is non productive Albuterol helps briefly---but no lasting improvement  Current Outpatient Prescriptions on File Prior to Visit  Medication Sig Dispense Refill  . albuterol (VENTOLIN HFA) 108 (90 BASE) MCG/ACT inhaler Inhale 2 puffs into the lungs every 6 (six) hours as needed for wheezing.  1 Inhaler  1  . aspirin 81 MG tablet Take one by mouth once a day       . chlorpheniramine-HYDROcodone (TUSSIONEX) 10-8 MG/5ML LQCR Take 5 mLs by mouth at bedtime as needed. Sedation precautions  140 mL  0  . EPINEPHrine (EPIPEN) 0.3 mg/0.3 mL DEVI Use as directed       . FLUoxetine (PROZAC) 40 MG capsule Take 40 mg by mouth daily.        Marland Kitchen glipiZIDE (GLUCOTROL) 10 MG tablet Take 1 tablet (10 mg total) by mouth 2 (two) times daily.  60 tablet  1  . glucose blood (BAYER CONTOUR TEST) test strip Use as instructed to test blood sugar 3 times daily, dx: 250.02  100 each  6  . insulin glargine (LANTUS) 100 UNIT/ML injection Inject 45 Units into the skin at bedtime.  10 mL  3  . lamoTRIgine (LAMICTAL) 100 MG tablet Take 100 mg by mouth 2 (two) times daily.      . montelukast (SINGULAIR) 10 MG tablet Take 1 tablet (10 mg total) by mouth at bedtime.  30 tablet  5  . propranolol (INDERAL) 10 MG tablet Take one tablet by mouth two to three times daily  90 tablet  3  . QUEtiapine (SEROQUEL) 50 MG tablet Take 50 mg by mouth 2 (two) times daily.         Allergies  Allergen Reactions  . Aripiprazole   . Codeine Phosphate     REACTION: unspecified  . Divalproex Sodium     REACTION: tremor  . Lithium Carbonate     REACTION: unspecified  . Metformin     REACTION: nausea--just  couldn't tolerate  . Olanzapine     REACTION: unspecified  . Primidone     REACTION: unspecified    Past Medical History  Diagnosis Date  . Hypertension   . Diabetes mellitus   . Hyperlipidemia   . History of colonic polyps   . Bipolar 1 disorder   . OCD (obsessive compulsive disorder)   . Stuttering   . Tremor, essential   . 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 DJD (degenerative joint disease)     lumbar spine  . Basal cell carcinoma     multiple    Past Surgical History  Procedure Date  . Cervical fusion 1990  . Cholesteatoma excision 1993  . Other surgical history     benign head tumor#5  . Other surgical history     sebaceous cysts-post neck x5  . Appendectomy 1960  . External ear surgery     multiple ear surguries as a child  . Tonsillectomy   . Collateral ligament repair, elbow     bilateral, nerve improvement left 08/04, right 09/04  . Shoulder arthroscopy w/ acromial repair 08/2004    left shoulder  .  Cervical fusion 8/12    multiple levels with plates    Family History  Problem Relation Age of Onset  . OCD Mother   . Bipolar disorder Mother   . Cancer Mother   . OCD Sister   . OCD Sister     History   Social History  . Marital Status: Married    Spouse Name: N/A    Number of Children: 1  . Years of Education: N/A   Occupational History  . disabled    Social History Main Topics  . Smoking status: Former Games developer  . Smokeless tobacco: Never Used  . Alcohol Use: No  . Drug Use: Not on file  . Sexually Active: Not on file   Other Topics Concern  . Not on file   Social History Narrative  . No narrative on file   Review of Systems Appetite is normal No vomiting or diarrhea    Objective:   Physical Exam  Constitutional: He appears well-developed and well-nourished. No distress.  HENT:  Mouth/Throat: Oropharynx is clear and moist. No oropharyngeal exudate.       No sinus tenderness TMs are fine Mild nasal congestion  Neck:  Normal range of motion. Neck supple.  Pulmonary/Chest: Effort normal. No respiratory distress. He has wheezes. He has no rales.       Slight exp wheezes---not really tight though  Lymphadenopathy:    He has no cervical adenopathy.          Assessment & Plan:

## 2012-01-08 ENCOUNTER — Ambulatory Visit: Payer: Medicare Other | Admitting: Internal Medicine

## 2012-01-13 ENCOUNTER — Ambulatory Visit (INDEPENDENT_AMBULATORY_CARE_PROVIDER_SITE_OTHER): Payer: Medicare Other | Admitting: Pulmonary Disease

## 2012-01-13 ENCOUNTER — Encounter: Payer: Self-pay | Admitting: Pulmonary Disease

## 2012-01-13 VITALS — BP 120/78 | HR 87 | Temp 98.3°F | Ht 68.5 in | Wt 204.8 lb

## 2012-01-13 DIAGNOSIS — R05 Cough: Secondary | ICD-10-CM

## 2012-01-13 DIAGNOSIS — R059 Cough, unspecified: Secondary | ICD-10-CM

## 2012-01-13 DIAGNOSIS — J310 Chronic rhinitis: Secondary | ICD-10-CM

## 2012-01-13 MED ORDER — SULFAMETHOXAZOLE-TMP DS 800-160 MG PO TABS: 1.0000 | ORAL_TABLET | Freq: Two times a day (BID) | ORAL | Status: AC

## 2012-01-13 NOTE — Assessment & Plan Note (Addendum)
Mr. Broad subacute cough has persisted since March 2013.  Based on his symptoms, I think that it is most likely multifactorial with sinus congestion and post nasal drip contributing.  I am somewhat concerned about pertussis given the persistence of the cough, his severe coughing paroxysms, and the fact that his wife had the cough.  He was treated with azithromycin in 11/2011, so in theory pertussis should have been covered then.  He clearly has bronchitis today as he is producing sputum.  He does not have COPD based on his normal spirometry today and by history he does not have chronic bronchitis despite his lengthy smoking history.  Plan: -treat bronchitis with bactrim for one week -treat sinus congestion and post nasal drip with sinus toilette, nasal steroid -rtc in 4-6 weeks or sooner if no improvement

## 2012-01-13 NOTE — Patient Instructions (Addendum)
Chronic cough, or cough lasting longer than 8 weeks, can be caused by many different conditions, including smoking, asthma, sinus congestion/runny nose, acid reflux or medications.  We believe that your cough is due to post nasal drip and bronchitis  Take delsym two tsp every 12 hours. Swallowing water or using ice chips/non mint and menthol containing candies (such as lifesavers or sugarless jolly ranchers) are also effective. You should rest your voice and avoid activities that you know make you cough.   Once you have eliminated the cough for 3 straight days try reducing the delsym as tolerated.   Use Neil Med rinses with distilled water at least twice per day using the instructions on the package. 1/2 hour after using the [facility] Med rinse, use Nasonex two puffs in each nostril once per day. Use chlortrimeton and an over the counter decongestant (ask the pharmacist for a recommendation) as needed for the cough.  We will see you back in 4-6 weeks.  Call us sooner if you get worse.
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
## 2012-01-14 ENCOUNTER — Other Ambulatory Visit: Payer: Self-pay | Admitting: *Deleted

## 2012-01-14 MED ORDER — GLIPIZIDE 10 MG PO TABS: 10.0000 mg | ORAL_TABLET | Freq: Two times a day (BID) | ORAL | Status: DC

## 2012-01-22 ENCOUNTER — Telehealth: Payer: Self-pay | Admitting: Pulmonary Disease

## 2012-01-22 MED ORDER — MOMETASONE FUROATE 50 MCG/ACT NA SUSP: 2.0000 | Freq: Every day | NASAL | Status: DC

## 2012-01-22 NOTE — Telephone Encounter (Signed)
I spoke with pt and is aware rx has been sent and nothing further was needed 

## 2012-01-29 ENCOUNTER — Encounter: Payer: Self-pay | Admitting: Internal Medicine

## 2012-01-29 ENCOUNTER — Ambulatory Visit (INDEPENDENT_AMBULATORY_CARE_PROVIDER_SITE_OTHER): Payer: Medicare Other | Admitting: Internal Medicine

## 2012-01-29 VITALS — BP 128/80 | HR 80 | Temp 97.9°F | Ht 68.0 in | Wt 207.0 lb

## 2012-01-29 DIAGNOSIS — R05 Cough: Secondary | ICD-10-CM

## 2012-01-29 MED ORDER — METFORMIN HCL ER 500 MG PO TB24: 500.0000 mg | ORAL_TABLET | Freq: Every day | ORAL | Status: DC

## 2012-01-29 NOTE — Assessment & Plan Note (Signed)
Will retry metformin Split lantus and increase if needed Consider januvia

## 2012-01-29 NOTE — Assessment & Plan Note (Signed)
Better now Has responded to regimen from Dr Kendrick Fries

## 2012-01-29 NOTE — Progress Notes (Signed)
Subjective:    Patient ID: Walter Harris, male    DOB: 06-Sep-1946, 66 y.o.   MRN: [id number]  HPI Cough is better Did well with Dr Kendrick Fries Doing nasal toilet with saline and using nasonex  Did get rash from the bactrim  Diabetes still not under good control Was up to 493 after breakfast recently Fasting 140-150 Up to 50 units of lantus Did try splitting the dose one day---not sure it made any difference No hypoglycemic reactions though did get a 58 measurement 1 day  Current Outpatient Prescriptions on File Prior to Visit  Medication Sig Dispense Refill  . albuterol (VENTOLIN HFA) 108 (90 BASE) MCG/ACT inhaler Inhale 2 puffs into the lungs every 6 (six) hours as needed for wheezing.  1 Inhaler  1  . aspirin 81 MG tablet Take one by mouth once a day       . EPINEPHrine (EPIPEN) 0.3 mg/0.3 mL DEVI Use as directed       . FLUoxetine (PROZAC) 40 MG capsule Take 40 mg by mouth daily.        Marland Kitchen glipiZIDE (GLUCOTROL) 10 MG tablet Take 1 tablet (10 mg total) by mouth 2 (two) times daily.  60 tablet  11  . glucose blood (BAYER CONTOUR TEST) test strip Use as instructed to test blood sugar 3 times daily, dx: 250.02  100 each  6  . insulin glargine (LANTUS) 100 UNIT/ML injection Inject 45 Units into the skin at bedtime.  10 mL  3  . lamoTRIgine (LAMICTAL) 100 MG tablet Take 100 mg by mouth 2 (two) times daily.      . mometasone (NASONEX) 50 MCG/ACT nasal spray Place 2 sprays into the nose daily.  17 g  6  . propranolol (INDERAL) 10 MG tablet Take one tablet by mouth two to three times daily  90 tablet  3  . QUEtiapine (SEROQUEL) 50 MG tablet Take 50 mg by mouth 2 (two) times daily.         Allergies  Allergen Reactions  . Aripiprazole   . Codeine Phosphate     REACTION: unspecified  . Divalproex Sodium     REACTION: tremor  . Lithium Carbonate     REACTION: unspecified  . Metformin     REACTION: nausea--just couldn't tolerate  . Olanzapine     REACTION: unspecified  . Primidone     REACTION: unspecified  . Bactrim (Sulfamethoxazole W-Trimethoprim)     mild    Past Medical History  Diagnosis Date  . Hypertension   . Diabetes mellitus   . Hyperlipidemia   . History of colonic polyps   . Bipolar 1 disorder   . OCD (obsessive compulsive disorder)   . Stuttering   . Tremor, essential   . 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 DJD (degenerative joint disease)     lumbar spine  . Basal cell carcinoma     multiple    Past Surgical History  Procedure Date  . Cervical fusion 1990  . Cholesteatoma excision 1993  . Other surgical history     benign head tumor#5  . Other surgical history     sebaceous cysts-post neck x5  . Appendectomy 1960  . External ear surgery     multiple ear surguries as a child  . Tonsillectomy   . Collateral ligament repair, elbow     bilateral, nerve improvement left 08/04, right 09/04  . Shoulder arthroscopy w/ acromial repair 08/2004  left shoulder  . Cervical fusion 8/12    multiple levels with plates    Family History  Problem Relation Age of Onset  . OCD Mother   . Bipolar disorder Mother   . Cancer Mother   . OCD Sister   . OCD Sister   . 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 Marital Status: Married    Spouse Name: N/A    Number of Children: 1  . Years of Education: N/A   Occupational History  . disabled    Social History Main Topics  . Smoking status: Former Smoker -- 0.5 packs/day for 40 years    Types: Cigarettes    Quit date: 09/10/2003  . Smokeless tobacco: Never Used  . Alcohol Use: No  . Drug Use: No  . Sexually Active: Not on file   Other Topics Concern  . Not on file   Social History Narrative  . No narrative on file   Review of Systems Has blurry vision in periphery at times--recent exam fine Still with leg and back pain Occ burning in legs    Objective:   Physical Exam  Constitutional: He appears well-developed and  well-nourished. No distress.  Neck: Normal range of motion. Neck supple. No thyromegaly present.  Cardiovascular: Normal rate, regular rhythm and normal heart sounds.  Exam reveals no gallop.   No murmur heard. Pulmonary/Chest: Effort normal and breath sounds normal. No respiratory distress. He has no wheezes. He has no rales.  Musculoskeletal: He exhibits no edema.  Lymphadenopathy:    He has no cervical adenopathy.  Psychiatric: He has a normal mood and affect. His behavior is normal.          Assessment & Plan:

## 2012-01-29 NOTE — Patient Instructions (Signed)
Start with 1 metformin before breakfast. If no problems with your bowels, increase to 2 tabs in the morning. Change the lantus to 25units twice a day. If your fasting sugars are still over 150 in 1 month, go up to 30 units twice a day

## 2012-01-30 ENCOUNTER — Other Ambulatory Visit: Payer: Self-pay | Admitting: *Deleted

## 2012-01-30 MED ORDER — GLUCOSE BLOOD VI STRP: ORAL_STRIP | Status: DC

## 2012-01-31 ENCOUNTER — Institutional Professional Consult (permissible substitution): Payer: Medicare Other | Admitting: Pulmonary Disease

## 2012-02-06 ENCOUNTER — Other Ambulatory Visit: Payer: Self-pay | Admitting: Otolaryngology

## 2012-02-06 DIAGNOSIS — R42 Dizziness and giddiness: Secondary | ICD-10-CM

## 2012-02-06 DIAGNOSIS — H905 Unspecified sensorineural hearing loss: Secondary | ICD-10-CM

## 2012-02-06 DIAGNOSIS — R251 Tremor, unspecified: Secondary | ICD-10-CM

## 2012-02-07 NOTE — Progress Notes (Signed)
Blood drawn for labs for MR tomorrow; from left [facility] space without difficulty by Victory Dakin, RN.  Larina Earthly, RN

## 2012-02-08 ENCOUNTER — Ambulatory Visit
Admission: RE | Admit: 2012-02-08 | Discharge: 2012-02-08 | Disposition: A | Discharge status: Home / Self Care | Payer: Medicare Other | Source: Ambulatory Visit | Attending: Otolaryngology | Admitting: Otolaryngology

## 2012-02-08 DIAGNOSIS — R251 Tremor, unspecified: Secondary | ICD-10-CM

## 2012-02-08 DIAGNOSIS — H905 Unspecified sensorineural hearing loss: Secondary | ICD-10-CM

## 2012-02-08 DIAGNOSIS — R42 Dizziness and giddiness: Secondary | ICD-10-CM

## 2012-02-08 MED ORDER — GADOBENATE DIMEGLUMINE 529 MG/ML IV SOLN
20.0000 mL | Freq: Once | INTRAVENOUS | Status: AC | PRN
  Administered 2012-02-08: 20 mL via INTRAVENOUS

## 2012-02-10 ENCOUNTER — Institutional Professional Consult (permissible substitution): Payer: Medicare Other | Admitting: Pulmonary Disease

## 2012-02-10 ENCOUNTER — Other Ambulatory Visit: Payer: Medicare Other

## 2012-02-20 ENCOUNTER — Ambulatory Visit: Payer: Medicare Other | Admitting: Pulmonary Disease

## 2012-03-10 ENCOUNTER — Other Ambulatory Visit: Payer: Self-pay

## 2012-03-10 MED ORDER — INSULIN GLARGINE 100 UNIT/ML ~~LOC~~ SOLN: 25.0000 [IU] | Freq: Two times a day (BID) | SUBCUTANEOUS | Status: DC

## 2012-03-10 NOTE — Telephone Encounter (Signed)
Pt request lantus insulin refill to [facility]. Pt presently taking 25 u twice a day. Medication phoned to [facility] pharmacy as instructed. For 1 month rx 2 bottles or 20 ml given. Pt notified while on phone med called in.

## 2012-04-14 ENCOUNTER — Ambulatory Visit: Payer: Self-pay | Admitting: Gastroenterology

## 2012-04-15 LAB — PATHOLOGY REPORT

## 2012-04-28 ENCOUNTER — Other Ambulatory Visit: Payer: Self-pay | Admitting: Neurosurgery

## 2012-04-28 DIAGNOSIS — IMO0002 Reserved for concepts with insufficient information to code with codable children: Secondary | ICD-10-CM

## 2012-04-28 DIAGNOSIS — M47817 Spondylosis without myelopathy or radiculopathy, lumbosacral region: Secondary | ICD-10-CM

## 2012-04-28 DIAGNOSIS — M51379 Other intervertebral disc degeneration, lumbosacral region without mention of lumbar back pain or lower extremity pain: Secondary | ICD-10-CM

## 2012-04-28 DIAGNOSIS — M48061 Spinal stenosis, lumbar region without neurogenic claudication: Secondary | ICD-10-CM

## 2012-04-28 DIAGNOSIS — M5137 Other intervertebral disc degeneration, lumbosacral region: Secondary | ICD-10-CM

## 2012-05-05 ENCOUNTER — Other Ambulatory Visit: Payer: Medicare Other

## 2012-05-18 ENCOUNTER — Other Ambulatory Visit: Payer: Self-pay | Admitting: Neurosurgery

## 2012-05-18 DIAGNOSIS — IMO0002 Reserved for concepts with insufficient information to code with codable children: Secondary | ICD-10-CM

## 2012-05-18 DIAGNOSIS — M48061 Spinal stenosis, lumbar region without neurogenic claudication: Secondary | ICD-10-CM

## 2012-05-18 DIAGNOSIS — M5137 Other intervertebral disc degeneration, lumbosacral region: Secondary | ICD-10-CM

## 2012-05-18 DIAGNOSIS — M47817 Spondylosis without myelopathy or radiculopathy, lumbosacral region: Secondary | ICD-10-CM

## 2012-05-22 ENCOUNTER — Other Ambulatory Visit: Payer: Medicare Other

## 2012-05-25 ENCOUNTER — Ambulatory Visit
Admission: RE | Admit: 2012-05-25 | Discharge: 2012-05-25 | Disposition: A | Discharge status: Home / Self Care | Payer: Medicare Other | Source: Ambulatory Visit | Attending: Neurosurgery | Admitting: Neurosurgery

## 2012-05-25 DIAGNOSIS — M47817 Spondylosis without myelopathy or radiculopathy, lumbosacral region: Secondary | ICD-10-CM

## 2012-05-25 DIAGNOSIS — M48061 Spinal stenosis, lumbar region without neurogenic claudication: Secondary | ICD-10-CM

## 2012-05-25 DIAGNOSIS — IMO0002 Reserved for concepts with insufficient information to code with codable children: Secondary | ICD-10-CM

## 2012-05-25 DIAGNOSIS — M5137 Other intervertebral disc degeneration, lumbosacral region: Secondary | ICD-10-CM

## 2012-05-25 MED ORDER — GADOBENATE DIMEGLUMINE 529 MG/ML IV SOLN
19.0000 mL | Freq: Once | INTRAVENOUS | Status: AC | PRN
  Administered 2012-05-25: 19 mL via INTRAVENOUS

## 2012-06-01 ENCOUNTER — Encounter: Payer: Self-pay | Admitting: Internal Medicine

## 2012-06-01 ENCOUNTER — Ambulatory Visit (INDEPENDENT_AMBULATORY_CARE_PROVIDER_SITE_OTHER): Payer: Medicare Other | Admitting: Internal Medicine

## 2012-06-01 VITALS — BP 148/80 | HR 83 | Temp 97.5°F | Ht 68.0 in | Wt 212.0 lb

## 2012-06-01 DIAGNOSIS — E785 Hyperlipidemia, unspecified: Secondary | ICD-10-CM

## 2012-06-01 DIAGNOSIS — I10 Essential (primary) hypertension: Secondary | ICD-10-CM

## 2012-06-01 DIAGNOSIS — IMO0002 Reserved for concepts with insufficient information to code with codable children: Secondary | ICD-10-CM

## 2012-06-01 DIAGNOSIS — IMO0001 Reserved for inherently not codable concepts without codable children: Secondary | ICD-10-CM

## 2012-06-01 DIAGNOSIS — F319 Bipolar disorder, unspecified: Secondary | ICD-10-CM

## 2012-06-01 LAB — CBC WITH DIFFERENTIAL/PLATELET
Basophils Absolute: 0 10*3/uL (ref 0.0–0.1)
Eosinophils Relative: 2.4 % (ref 0.0–5.0)
Lymphs Abs: 1.6 10*3/uL (ref 0.7–4.0)
MCHC: 32.8 g/dL (ref 30.0–36.0)
Monocytes Absolute: 0.5 10*3/uL (ref 0.1–1.0)
Monocytes Relative: 8.2 % (ref 3.0–12.0)
Neutro Abs: 4 10*3/uL (ref 1.4–7.7)
Neutrophils Relative %: 64.1 % (ref 43.0–77.0)
RBC: 4.51 Mil/uL (ref 4.22–5.81)
WBC: 6.3 10*3/uL (ref 4.5–10.5)

## 2012-06-01 LAB — HEMOGLOBIN A1C: Hgb A1c MFr Bld: 9.6 % — ABNORMAL HIGH (ref 4.6–6.5)

## 2012-06-01 NOTE — Assessment & Plan Note (Signed)
Had myalgias with pravastatin Will recheck but hold off on other meds given his polypharmacy

## 2012-06-01 NOTE — Assessment & Plan Note (Signed)
BP Readings from Last 3 Encounters:  06/01/12 148/80  01/29/12 128/80  01/13/12 120/78   Up some today but is upset No changes for now

## 2012-06-01 NOTE — Assessment & Plan Note (Signed)
Hopefully has better control now Weight up slightly Has better sugars and fairly regular, though mild, hypoglycemic reactions Goal under 9%

## 2012-06-01 NOTE — Progress Notes (Signed)
Subjective:    Patient ID: Walter Harris, male    DOB: 12-24-1945, 66 y.o.   MRN: [id number]  HPI Went on weight watchers again Has done this before but actually gained weight this time "I just don't care anymore.... I don't have anyone to impress"  Sugars have been pretty good--checks daily Low 100's in AM Occ low sugar reaction in middle of night-- 50 and mild symptoms. Better with saltines/milk  Has some substernal chest pain---relates to neck problems Pain down both legs again Has "crawly" feeling Pain again in left leg with standing Dr Wynetta Emery has been following  Mood isn't great Some days I get "ill as hell" Some good days though--perhaps 2 per day Has tried walking but can't go further than 100 yards--then gets the back pain  Current Outpatient Prescriptions on File Prior to Visit  Medication Sig Dispense Refill  . albuterol (VENTOLIN HFA) 108 (90 BASE) MCG/ACT inhaler Inhale 2 puffs into the lungs every 6 (six) hours as needed for wheezing.  1 Inhaler  1  . aspirin 81 MG tablet Take one by mouth once a day       . EPINEPHrine (EPIPEN) 0.3 mg/0.3 mL DEVI Use as directed       . FLUoxetine (PROZAC) 40 MG capsule Take 40 mg by mouth daily.        Marland Kitchen glipiZIDE (GLUCOTROL) 10 MG tablet Take 1 tablet (10 mg total) by mouth 2 (two) times daily.  60 tablet  11  . glucose blood (BAYER CONTOUR TEST) test strip Use as instructed to test blood sugar 3 times daily, dx: 250.02  100 each  6  . glucose blood test strip [employer] Contour (Microfill) m/mm  100 each  11  . insulin glargine (LANTUS) 100 UNIT/ML injection Inject 25 Units into the skin 2 (two) times daily.  20 mL  11  . lamoTRIgine (LAMICTAL) 100 MG tablet Take 100 mg by mouth 2 (two) times daily.      . mometasone (NASONEX) 50 MCG/ACT nasal spray Place 2 sprays into the nose daily.  17 g  6  . propranolol (INDERAL) 10 MG tablet Take one tablet by mouth two to three times daily  90 tablet  3  . QUEtiapine (SEROQUEL) 50 MG tablet  Take 50 mg by mouth 2 (two) times daily.       Marland Kitchen DISCONTD: metFORMIN (GLUCOPHAGE-XR) 500 MG 24 hr tablet Take 1-2 tablets (500-1,000 mg total) by mouth daily with breakfast.  60 tablet  11    Allergies  Allergen Reactions  . Aripiprazole     Patient can't remember why he can't take it.  . Olanzapine     Can't remember   . Primidone     Can't remember   . Bactrim (Sulfamethoxazole W-Trimethoprim)     mild  . Codeine Phosphate Rash  . Divalproex Sodium Other (See Comments)     tremor  . Lithium Carbonate Other (See Comments)    Tremors   . Metformin Nausea Only    nausea--just couldn't tolerate    Past Medical History  Diagnosis Date  . Hypertension   . Diabetes mellitus   . Hyperlipidemia   . History of colonic polyps   . Bipolar 1 disorder   . OCD (obsessive compulsive disorder)   . Stuttering   . Tremor, essential   . 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 DJD (degenerative joint disease)     lumbar spine  .  Basal cell carcinoma     multiple    Past Surgical History  Procedure Date  . Cervical fusion 1990  . Cholesteatoma excision 1993  . Other surgical history     benign head tumor#5  . Other surgical history     sebaceous cysts-post neck x5  . Appendectomy 1960  . External ear surgery     multiple ear surguries as a child  . Tonsillectomy   . Collateral ligament repair, elbow     bilateral, nerve improvement left 08/04, right 09/04  . Shoulder arthroscopy w/ acromial repair 08/2004    left shoulder  . Cervical fusion 8/12    multiple levels with plates    Family History  Problem Relation Age of Onset  . OCD Mother   . Bipolar disorder Mother   . Cancer Mother   . OCD Sister   . OCD Sister   . 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 Marital Status: Married    Spouse Name: N/A    Number of Children: 1  . Years of Education: N/A   Occupational History  . disabled    Social History  Main Topics  . Smoking status: Former Smoker -- 0.5 packs/day for 40 years    Types: Cigarettes    Quit date: 09/10/2003  . Smokeless tobacco: Never Used  . Alcohol Use: No  . Drug Use: No  . Sexually Active: Not on file   Other Topics Concern  . Not on file   Social History Narrative  . No narrative on file   Review of Systems Sees Dr Dorma Russell regularly---no recurrence of cholesteatoma Appetite is okay Sleep is still not very good---aching at times and leg pain/jumping    Objective:   Physical Exam  Constitutional: He appears well-developed and well-nourished. No distress.       Some discomfort with movement and getting up on table  Neck: Normal range of motion. Neck supple. No thyromegaly present.  Cardiovascular: Normal rate, regular rhythm, normal heart sounds and intact distal pulses.  Exam reveals no gallop.   No murmur heard. Pulmonary/Chest: Effort normal and breath sounds normal. No respiratory distress. He has no wheezes. He has no rales.  Musculoskeletal: He exhibits no edema and no tenderness.  Lymphadenopathy:    He has no cervical adenopathy.  Skin: No rash noted. No erythema.  Psychiatric: His behavior is normal.       Frustrated but not with overt depression          Assessment & Plan:

## 2012-06-01 NOTE — Assessment & Plan Note (Signed)
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

## 2012-06-01 NOTE — Assessment & Plan Note (Signed)
Probably has spinal stenosis Following with Dr Wynetta Emery Some relief with hydrocodone

## 2012-06-02 LAB — BASIC METABOLIC PANEL
BUN: 18 mg/dL (ref 6–23)
Calcium: 9.3 mg/dL (ref 8.4–10.5)
Chloride: 101 mEq/L (ref 96–112)
Creatinine, Ser: 1 mg/dL (ref 0.4–1.5)
GFR: 78.68 mL/min (ref >60.00)

## 2012-06-02 LAB — LIPID PANEL
Cholesterol: 226 mg/dL — ABNORMAL HIGH (ref 0–200)
HDL: 46 mg/dL (ref >39.00)
Total CHOL/HDL Ratio: 5
Triglycerides: 135 mg/dL (ref 0.0–149.0)
VLDL: 27 mg/dL (ref 0.0–40.0)

## 2012-06-02 LAB — HEPATIC FUNCTION PANEL
AST: 34 U/L (ref 0–37)
Albumin: 4 g/dL (ref 3.5–5.2)
Alkaline Phosphatase: 67 U/L (ref 39–117)
Total Protein: 6.9 g/dL (ref 6.0–8.3)

## 2012-06-02 LAB — LDL CHOLESTEROL, DIRECT: Direct LDL: 160.2 mg/dL

## 2012-06-02 LAB — TSH: TSH: 1.63 u[IU]/mL (ref 0.35–5.50)

## 2012-06-03 ENCOUNTER — Telehealth: Payer: Self-pay | Admitting: Internal Medicine

## 2012-06-03 ENCOUNTER — Encounter: Payer: Self-pay | Admitting: Internal Medicine

## 2012-06-03 NOTE — Telephone Encounter (Signed)
Please advise 

## 2012-06-03 NOTE — Telephone Encounter (Signed)
Called to discuss the issue I am concerned about adding meds when he is already having low sugar reactions We agreed endocrine consult would be good idea---he requests Dr Altheimer at [facility]

## 2012-06-09 ENCOUNTER — Encounter: Payer: Self-pay | Admitting: Internal Medicine

## 2012-06-12 ENCOUNTER — Encounter: Payer: Self-pay | Admitting: Internal Medicine

## 2012-07-17 ENCOUNTER — Other Ambulatory Visit: Payer: Self-pay | Admitting: *Deleted

## 2012-07-17 MED ORDER — INSULIN PEN NEEDLE 31G X 8 MM MISC: Status: DC

## 2012-07-31 ENCOUNTER — Telehealth: Payer: Self-pay | Admitting: Internal Medicine

## 2012-07-31 NOTE — Telephone Encounter (Signed)
Patient Information:  Caller Name: [place]  Phone: [phone]  Patient: Walter Harris, Walter Harris  Gender: Male  DOB: 11/06/1945  Age: 66 Years  PCP: [facility])   Symptoms  Reason For Call & Symptoms: She is having trouble going to sleep.  She and ahd several back surgeries, pain keeps awake and "my mind just races."  She has had Ambien in the past.  Reviewed Health History In EMR: Yes  Reviewed Medications In EMR: Yes  Reviewed Allergies In EMR: Yes  Date of Onset of Symptoms: 06/24/2012  Treatments Tried: OTC ZQuil  Treatments Tried Worked: No  Guideline(s) Used:  Insomnia  Breathing Difficulty  Disposition Per Guideline:   See Within 3 Days in Office  Reason For Disposition Reached:   Insomnia persists > 1 week and following Insomnia Care Advice  Advice Given:  N/A  Office Follow Up:  Does the office need to follow up with this patient?: No  Instructions For The Office: N/A  Pt disconnected before appt could be made.  Tried call back x 3 and phone just rings.  If pt calls back she needs an appt within 3 days to see about med to help her sleep.

## 2012-07-31 NOTE — Telephone Encounter (Signed)
Please call him He should call Dr Imogene Burn about the sleep problems It can be the start of bipolar symptoms worsening and he should decide about any needed meds

## 2012-07-31 NOTE — Telephone Encounter (Signed)
I spoke to wife Explained that I was not deferring to Dr Imogene Burn because I thought he was not using meds correctly, but that it could be a sign that his bipolar meds need to be increased (like the seroquel) She will call him  Will cancel the appt on Monday

## 2012-07-31 NOTE — Telephone Encounter (Signed)
Spoke with patient and he got upset and stated Dr.Chen doesn't care if he can't sleep and " I guess I'll walk the floor all night" and hung up the phone. Patient was also rude to call-a-nurse. Please advise

## 2012-08-03 ENCOUNTER — Ambulatory Visit: Payer: Medicare Other | Admitting: Internal Medicine

## 2012-09-28 ENCOUNTER — Telehealth: Payer: Self-pay

## 2012-09-28 MED ORDER — PROPRANOLOL HCL ER 80 MG PO CP24: 80.0000 mg | ORAL_CAPSULE | Freq: Every day | ORAL | Status: DC

## 2012-09-28 NOTE — Telephone Encounter (Signed)
Okay to refill for a year Should be the extended release Make sure he has a follow up appt scheduled

## 2012-09-28 NOTE — Telephone Encounter (Signed)
Pt left v/m; pt saw Dr Sandria Manly in June and propranolol was changed to 80 mg taking one daily. Pt is out of med but is not going back to Dr Sandria Manly and request Propranolol 80 mg refilled by Dr Alphonsus Sias to [facility]. Pt said he takes med for blood pressure and for "the jitters".Please advise.

## 2012-09-28 NOTE — Telephone Encounter (Signed)
Spoke with patient and advised results rx sent to pharmacy by e-script Patient will call to make appt

## 2012-10-01 ENCOUNTER — Ambulatory Visit (INDEPENDENT_AMBULATORY_CARE_PROVIDER_SITE_OTHER): Payer: Medicare Other | Admitting: Internal Medicine

## 2012-10-01 ENCOUNTER — Encounter: Payer: Self-pay | Admitting: Internal Medicine

## 2012-10-01 VITALS — BP 128/80 | HR 83 | Temp 97.9°F | Wt 203.0 lb

## 2012-10-01 DIAGNOSIS — R259 Unspecified abnormal involuntary movements: Secondary | ICD-10-CM

## 2012-10-01 DIAGNOSIS — R42 Dizziness and giddiness: Secondary | ICD-10-CM

## 2012-10-01 MED ORDER — PROPRANOLOL HCL ER 60 MG PO CP24: 60.0000 mg | ORAL_CAPSULE | Freq: Every day | ORAL | Status: DC

## 2012-10-01 NOTE — Assessment & Plan Note (Signed)
Sounds orthostatic and may be related to the propranolol ?some degree of hypoglcemia---invokana dose just decreased Will try decreasing propranolol

## 2012-10-01 NOTE — Patient Instructions (Signed)
Please decrease the propranolol to the 60mg  daily dose. If your dizziness persists, call and we can go back to the short acting dose or change to another medicine for your tremor.

## 2012-10-01 NOTE — Assessment & Plan Note (Signed)
Mild now on the long acting propranolol Will try decreasing the dose Consider going back to short acting propranolol or changing to primidone

## 2012-10-01 NOTE — Progress Notes (Signed)
Subjective:    Patient ID: WINSON EICHORN, male    DOB: 22-May-1946, 67 y.o.   MRN: [id number]  HPI Here because of concerns about low blood pressure and oxygen at recent OV Now seeing Dr Altheimer for his diabetes  Visit 1/21 and was hypotensive and trouble with oxygen sats meds changed --now on invokana Average down to 124 and A1c 7.2% No sig low sugar reactions that he knows of invokana dose has been cut in half---feels better now  Awakens feeling really good Then, has noted some lightheadedness in AM No energy after taking AM meds This feeling does fade  Dr Sandria Manly increased his propranolol for his tremor This was several months ago Had been having trouble writing, eating, etc--- before going on the long acting  Some orthostatic dizziness--has to "freeze in place till I get my balance"  Current Outpatient Prescriptions on File Prior to Visit  Medication Sig Dispense Refill  . ALPRAZolam (XANAX) 0.5 MG tablet Take 0.5 mg by mouth as needed.       Marland Kitchen aspirin 81 MG tablet Take one by mouth once a day       . EPINEPHrine (EPIPEN) 0.3 mg/0.3 mL DEVI Use as directed       . glucose blood test strip [employer]ensia Contour (Microfill) m/mm  100 each  11  . insulin glargine (LANTUS) 100 UNIT/ML injection Inject 20 Units into the skin daily.      Marland Kitchen lamoTRIgine (LAMICTAL) 100 MG tablet Take 100 mg by mouth 2 (two) times daily.      . pioglitazone (ACTOS) 30 MG tablet Take 30 mg by mouth daily.       . QUEtiapine (SEROQUEL) 50 MG tablet Take 50 mg by mouth 2 (two) times daily.         Allergies  Allergen Reactions  . Pravastatin     Severe myalgias  . Aripiprazole     Patient can't remember why he can't take it.  . Olanzapine     Can't remember   . Primidone     Can't remember   . Bactrim (Sulfamethoxazole W-Trimethoprim)     mild  . Codeine Phosphate Rash  . Divalproex Sodium Other (See Comments)     tremor  . Lithium Carbonate Other (See Comments)    Tremors     Past Medical  History  Diagnosis Date  . Hypertension   . Diabetes mellitus   . Hyperlipidemia   . History of colonic polyps   . Bipolar 1 disorder   . OCD (obsessive compulsive disorder)   . Stuttering   . Tremor, essential   . 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 DJD (degenerative joint disease)     lumbar spine  . Basal cell carcinoma     multiple    Past Surgical History  Procedure Date  . Cervical fusion 1990  . Cholesteatoma excision 1993  . Other surgical history     benign head tumor#5  . Other surgical history     sebaceous cysts-post neck x5  . Appendectomy 1960  . External ear surgery     multiple ear surguries as a child  . Tonsillectomy   . Collateral ligament repair, elbow     bilateral, nerve improvement left 08/04, right 09/04  . Shoulder arthroscopy w/ acromial repair 08/2004    left shoulder  . Cervical fusion 8/12    multiple levels with plates    Family History  Problem Relation  Age of Onset  . OCD Mother   . Bipolar disorder Mother   . Cancer Mother   . OCD Sister   . OCD Sister   . 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 Marital Status: Married    Spouse Name: N/A    Number of Children: 1  . Years of Education: N/A   Occupational History  . disabled    Social History Main Topics  . Smoking status: Former Smoker -- 0.5 packs/day for 40 years    Types: Cigarettes    Quit date: 09/10/2003  . Smokeless tobacco: Never Used  . Alcohol Use: No  . Drug Use: No  . Sexually Active: Not on file   Other Topics Concern  . Not on file   Social History Narrative  . No narrative on file   Review of Systems Has had some loose stools he relates to invokana and metformin---"but I am sticking with it" Has lost 17# in 2 months    Objective:   Physical Exam  Constitutional: He appears well-developed and well-nourished. No distress.  Psychiatric: He has a normal mood and affect. His behavior is  normal.       Mood is upbeat but not manic          Assessment & Plan:

## 2012-10-03 ENCOUNTER — Encounter: Payer: Self-pay | Admitting: Internal Medicine

## 2012-11-12 ENCOUNTER — Telehealth: Payer: Self-pay

## 2012-11-12 NOTE — Telephone Encounter (Signed)
Pt saw endocrinologist at [facility] today and  Doctor advised pt to contact PCP; pt needs to be on medication for high cholesterol;Pt said [facility] endocrinology is sending Dr Alphonsus Sias lab results. Pt wants med called in to [facility] . Pt took pravastatin before and that caused legs to ache.Please advise. Pt has appt to see Dr Alphonsus Sias in July.

## 2012-11-12 NOTE — Telephone Encounter (Signed)
Have him start atorvastatin 20mg  daily Tell him to cut it in half and only take 10mg  daily for the first 2 weeks, then if no muscle pains, increase to the full tab  Rx x 1 year Find out if endocrinologist will be seeing him within the next 2 months If not, set up lipid, hepatic in about 6 weeks

## 2012-11-17 MED ORDER — ATORVASTATIN CALCIUM 20 MG PO TABS: 20.0000 mg | ORAL_TABLET | Freq: Every day | ORAL | Status: DC

## 2012-11-17 NOTE — Telephone Encounter (Signed)
Spoke with patient and advised results, pt will see the endocrinologist every 6 weeks. rx sent to pharmacy by e-script Patient will call back for lab appt

## 2012-11-26 ENCOUNTER — Encounter: Payer: Self-pay | Admitting: Internal Medicine

## 2012-11-27 MED ORDER — CARISOPRODOL 350 MG PO TABS: 175.0000 mg | ORAL_TABLET | Freq: Every evening | ORAL | Status: DC | PRN

## 2012-11-27 NOTE — Telephone Encounter (Signed)
rx called into pharmacy

## 2013-01-08 ENCOUNTER — Encounter: Payer: Self-pay | Admitting: Internal Medicine

## 2013-01-08 NOTE — Telephone Encounter (Signed)
To letvak

## 2013-01-11 ENCOUNTER — Encounter: Payer: Self-pay | Admitting: Internal Medicine

## 2013-01-11 ENCOUNTER — Ambulatory Visit (INDEPENDENT_AMBULATORY_CARE_PROVIDER_SITE_OTHER): Payer: Medicare Other | Admitting: Internal Medicine

## 2013-01-11 VITALS — BP 110/70 | HR 88 | Temp 98.4°F | Wt 196.0 lb

## 2013-01-11 DIAGNOSIS — R5381 Other malaise: Secondary | ICD-10-CM

## 2013-01-11 NOTE — Assessment & Plan Note (Signed)
Severe and causing a major change in functional ability Has some degree of muscle pain as well No lymphadenopathy, HSM or other PE abnormalities  First thought is the statin that was restarted 2 months ago Will check ESR and CPK and have him try off the atorvastatin May want to hold the invokana if symptoms persist to see if that makes a difference

## 2013-01-11 NOTE — Progress Notes (Signed)
Subjective:    Patient ID: Walter Harris, male    DOB: Sep 07, 1946, 67 y.o.   MRN: [id number]  HPI "My energy is gone" Can't walk much----has to sit just walking through parking lot at [facility] Had car washed--just wiped it down some and he had to go to sleep for 4 hours Recent fall in depression in front yard while doing weedeating  Wonders if the invokana could be causing this Did cut this dose in half --just recently. No clear improvement Arms aching just carrying out trash Notices numbness in hands too Bad pain in pad of thumbs---"like someone stuck a dagger in there"  Atorvastatin started in that time period but not sure how ling it has gone on Feels his balance is worse also  Current Outpatient Prescriptions on File Prior to Visit  Medication Sig Dispense Refill  . ALPRAZolam (XANAX) 0.5 MG tablet Take 0.5 mg by mouth as needed.       Marland Kitchen aspirin 81 MG tablet Take one by mouth once a day       . atorvastatin (LIPITOR) 20 MG tablet Take 1 tablet (20 mg total) by mouth daily.  90 tablet  3  . carisoprodol (SOMA) 350 MG tablet Take 0.5-1 tablets (175-350 mg total) by mouth at bedtime as needed for muscle spasms.  30 tablet  0  . EPINEPHrine (EPIPEN) 0.3 mg/0.3 mL DEVI Use as directed       . FLUoxetine (PROZAC) 20 MG capsule Take 60 mg by mouth daily.      Marland Kitchen glucose blood test strip Ascensia Contour (Microfill) m/mm  100 each  11  . glucose blood test strip Use as instructed to test blood sugar 4 times daily, dx: 250.02      . insulin glargine (LANTUS) 100 UNIT/ML injection Inject 20 Units into the skin daily.      . INVOKANA 300 MG TABS Take 150 mg by mouth daily.       Marland Kitchen lamoTRIgine (LAMICTAL) 100 MG tablet Take 100 mg by mouth 2 (two) times daily.      . metFORMIN (GLUCOPHAGE) 500 MG tablet Take 2,000 mg by mouth daily with breakfast.      . pioglitazone (ACTOS) 30 MG tablet Take 30 mg by mouth daily.       . propranolol ER (INDERAL LA) 60 MG 24 hr capsule Take 1 capsule (60 mg  total) by mouth daily.  30 capsule  11  . QUEtiapine (SEROQUEL) 50 MG tablet Take 50 mg by mouth 2 (two) times daily.        No current facility-administered medications on file prior to visit.    Allergies  Allergen Reactions  . Pravastatin     Severe myalgias  . Aripiprazole     Patient can't remember why he can't take it.  . Olanzapine     Can't remember   . Primidone     Can't remember   . Bactrim (Sulfamethoxazole W-Trimethoprim)     mild  . Codeine Phosphate Rash  . Divalproex Sodium Other (See Comments)     tremor  . Lithium Carbonate Other (See Comments)    Tremors     Past Medical History  Diagnosis Date  . Hypertension   . Diabetes mellitus   . Hyperlipidemia   . History of colonic polyps   . Bipolar 1 disorder   . OCD (obsessive compulsive disorder)   . Stuttering   . Tremor, essential   . Deaf     right  ear, down in left ear  . DJD (degenerative joint disease)     lumbar spine  . Basal cell carcinoma     multiple    Past Surgical History  Procedure Laterality Date  . Cervical fusion  1990  . Cholesteatoma excision  1993  . Other surgical history      benign head tumor#5  . Other surgical history      sebaceous cysts-post neck x5  . Appendectomy  1960  . External ear surgery      multiple ear surguries as a child  . Tonsillectomy    . Collateral ligament repair, elbow      bilateral, nerve improvement left 08/04, right 09/04  . Shoulder arthroscopy w/ acromial repair  08/2004    left shoulder  . Cervical fusion  8/12    multiple levels with plates    Family History  Problem Relation Age of Onset  . OCD Mother   . Bipolar disorder Mother   . Cancer Mother   . OCD Sister   . OCD Sister   . 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 Marital Status: Married    Spouse Name: N/A    Number of Children: 1  . Years of Education: N/A   Occupational History  . disabled    Social History  Main Topics  . Smoking status: Former Smoker -- 0.50 packs/day for 40 years    Types: Cigarettes    Quit date: 09/10/2003  . Smokeless tobacco: Never Used  . Alcohol Use: No  . Drug Use: No  . Sexually Active: Not on file   Other Topics Concern  . Not on file   Social History Narrative  . No narrative on file   Review of Systems Has lost a few pounds--- eating better Diabetes much better but no sig hypoglycemic spells.    Objective:   Physical Exam  Constitutional: He appears well-developed and well-nourished. No distress.  Neck: Normal range of motion. Neck supple. No thyromegaly present.  Cardiovascular: Normal rate, regular rhythm and normal heart sounds.  Exam reveals no gallop.   No murmur heard. Pulmonary/Chest: Effort normal and breath sounds normal. No respiratory distress. He has no wheezes. He has no rales.  Abdominal: Soft. He exhibits no distension and no mass. There is no tenderness.  No HSM  Musculoskeletal: He exhibits no edema and no tenderness.  Lymphadenopathy:       Head (right side): No preauricular, no posterior auricular and no occipital adenopathy present.       Head (left side): No preauricular, no posterior auricular and no occipital adenopathy present.    He has no cervical adenopathy.    He has no axillary adenopathy.       Right: No inguinal adenopathy present.       Left: No inguinal adenopathy present.  Psychiatric:  frustrated          Assessment & Plan:

## 2013-01-11 NOTE — Patient Instructions (Addendum)
Please stop the atorvastatin for now. If the fatigue and aching goes away, please stay off it. If your symptoms persist, you may want to try stopping the invokana as well

## 2013-01-19 ENCOUNTER — Encounter: Payer: Self-pay | Admitting: Internal Medicine

## 2013-01-20 ENCOUNTER — Telehealth: Payer: Self-pay | Admitting: *Deleted

## 2013-01-20 NOTE — Telephone Encounter (Signed)
Patient scheduled an appt to discuss meds with Dr.Letvak

## 2013-01-20 NOTE — Telephone Encounter (Signed)
appt scheduled 01/26/13

## 2013-01-25 ENCOUNTER — Encounter: Payer: Self-pay | Admitting: Radiology

## 2013-01-26 ENCOUNTER — Ambulatory Visit: Payer: Medicare Other | Admitting: Internal Medicine

## 2013-01-27 ENCOUNTER — Encounter: Payer: Self-pay | Admitting: Internal Medicine

## 2013-01-27 ENCOUNTER — Ambulatory Visit (INDEPENDENT_AMBULATORY_CARE_PROVIDER_SITE_OTHER): Payer: Medicare Other | Admitting: Internal Medicine

## 2013-01-27 VITALS — BP 120/80 | HR 78 | Temp 98.2°F | Wt 197.0 lb

## 2013-01-27 DIAGNOSIS — F319 Bipolar disorder, unspecified: Secondary | ICD-10-CM

## 2013-01-27 NOTE — Assessment & Plan Note (Signed)
Counseled all of 15 minute visit Discussed that his offering changes may be related to dissatisfaction with treatment May need to go back to other regimen (can't afford latuda) Probably should continue with Dr Chen---discussed referral if he wants to change Can't start the latuda  Asked that he restart at least one of his mood stabilizers (lamictal, gabapentin or seroquel)

## 2013-01-27 NOTE — Progress Notes (Signed)
Subjective:    Patient ID: Walter Harris, male    DOB: 1945-11-11, 67 y.o.   MRN: [id number]  HPI Has lost confidence in Dr Imogene Burn Has asked him about whether he wants to try different meds "I don't know about that stuff" Last visit he stopped "everything" Weaned him off quetiapine and lamotrigine Wanted to try him on latuda---can't afford it though Suggested he take gabapentin but didn't start it because he thinks it was not effective for him in the past  Discussed his behavior yesterday He got angry when there was a miscommunication with the time I would have seen him but he stormed off He denies this and his anger in general (lacks some insight)  Upset about physical limitations  Current Outpatient Prescriptions on File Prior to Visit  Medication Sig Dispense Refill  . ALPRAZolam (XANAX) 0.5 MG tablet Take 0.5 mg by mouth 2 (two) times daily.       Marland Kitchen aspirin 81 MG tablet Take one by mouth once a day       . carisoprodol (SOMA) 350 MG tablet Take 0.5-1 tablets (175-350 mg total) by mouth at bedtime as needed for muscle spasms.  30 tablet  0  . FLUoxetine (PROZAC) 20 MG capsule Take 20 mg by mouth 2 (two) times daily.       Marland Kitchen glucose blood test strip Ascensia Contour (Microfill) m/mm  100 each  11  . glucose blood test strip Use as instructed to test blood sugar 4 times daily, dx: 250.02      . insulin glargine (LANTUS) 100 UNIT/ML injection Inject 20 Units into the skin daily.      . INVOKANA 300 MG TABS Take 150 mg by mouth daily.       . metFORMIN (GLUCOPHAGE) 500 MG tablet Take 2,000 mg by mouth daily with breakfast.      . pioglitazone (ACTOS) 30 MG tablet Take 30 mg by mouth daily.       . propranolol ER (INDERAL LA) 60 MG 24 hr capsule Take 1 capsule (60 mg total) by mouth daily.  30 capsule  11   No current facility-administered medications on file prior to visit.    Allergies  Allergen Reactions  . Pravastatin     Severe myalgias  . Aripiprazole     Patient can't  remember why he can't take it.  . Olanzapine     Can't remember   . Primidone     Can't remember   . Bactrim (Sulfamethoxazole W-Trimethoprim)     mild  . Codeine Phosphate Rash  . Divalproex Sodium Other (See Comments)     tremor  . Lithium Carbonate Other (See Comments)    Tremors     Past Medical History  Diagnosis Date  . Hypertension   . Diabetes mellitus   . Hyperlipidemia   . History of colonic polyps   . Bipolar 1 disorder   . OCD (obsessive compulsive disorder)   . Stuttering   . Tremor, essential   . 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 DJD (degenerative joint disease)     lumbar spine  . Basal cell carcinoma     multiple    Past Surgical History  Procedure Laterality Date  . Cervical fusion  1990  . Cholesteatoma excision  1993  . Other surgical history      benign head tumor#5  . Other surgical history      sebaceous cysts-post neck x5  .  Appendectomy  1960  . External ear surgery      multiple ear surguries as a child  . Tonsillectomy    . Collateral ligament repair, elbow      bilateral, nerve improvement left 08/04, right 09/04  . Shoulder arthroscopy w/ acromial repair  08/2004    left shoulder  . Cervical fusion  8/12    multiple levels with plates    Family History  Problem Relation Age of Onset  . OCD Mother   . Bipolar disorder Mother   . Cancer Mother   . OCD Sister   . OCD Sister   . 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 Marital Status: Married    Spouse Name: N/A    Number of Children: 1  . Years of Education: N/A   Occupational History  . disabled    Social History Main Topics  . Smoking status: Former Smoker -- 0.50 packs/day for 40 years    Types: Cigarettes    Quit date: 09/10/2003  . Smokeless tobacco: Never Used  . Alcohol Use: No  . Drug Use: No  . Sexually Active: Not on file   Other Topics Concern  . Not on file   Social History Narrative  . No  narrative on file   Review of Systems Sleep has been poor since changing medications---mind racing some Appetite is stable--appetite down on invokana    Objective:   Physical Exam  Psychiatric:  Calm and appropriate here today          Assessment & Plan:

## 2013-01-27 NOTE — Patient Instructions (Signed)
Please restart at least one of the mood stabilizers--- the seroquel or the lamictal (lamotrigine)

## 2013-02-12 ENCOUNTER — Encounter: Payer: Self-pay | Admitting: Radiology

## 2013-02-15 ENCOUNTER — Ambulatory Visit: Payer: Medicare Other | Admitting: Internal Medicine

## 2013-03-01 ENCOUNTER — Telehealth: Payer: Self-pay | Admitting: Internal Medicine

## 2013-03-01 NOTE — Telephone Encounter (Signed)
Fax refill request for propranolol HCL 10 mg, but med list shows pt taking propranolol ER 60 mg, please advise

## 2013-03-01 NOTE — Telephone Encounter (Signed)
Please check with him about what he is on and okay to fill for a year

## 2013-03-02 NOTE — Telephone Encounter (Signed)
Spoke with pharmacist at [facility] and they are confused, they received a e-script today from Dr. Einar Crow @ [facility] for PROPRANOLOL 10 mg take 2 daily. Pharmacist states they filled the ER 60 mg on 01/27/2013 from Dr. Alphonsus Sias. Who should be prescribing this? Please advise  .left message to have patient return my call.

## 2013-03-03 NOTE — Telephone Encounter (Signed)
Please check with him Has he changed doctors? He should not be getting Rx from Dr Dareen Piano if he is still my patient

## 2013-03-09 NOTE — Telephone Encounter (Signed)
.  left message to have patient return my call.  

## 2013-03-09 NOTE — Telephone Encounter (Addendum)
Spoke with patient and he states we should have not got a refill request, he gets his refills from Dr. Dareen Piano..  Med list updated with correct medication and strength.

## 2013-03-09 NOTE — Addendum Note (Signed)
Addended by: Sueanne Margarita on: 03/09/2013 12:36 PM   Modules accepted: Orders

## 2013-03-31 ENCOUNTER — Ambulatory Visit: Payer: Medicare Other | Admitting: Internal Medicine

## 2013-04-14 ENCOUNTER — Other Ambulatory Visit: Payer: Self-pay

## 2013-05-12 ENCOUNTER — Other Ambulatory Visit: Payer: Self-pay | Admitting: Orthopedic Surgery

## 2013-05-12 ENCOUNTER — Encounter (HOSPITAL_BASED_OUTPATIENT_CLINIC_OR_DEPARTMENT_OTHER): Payer: Self-pay | Admitting: *Deleted

## 2013-05-12 NOTE — H&P (Signed)
Walter Harris is an 67 y.o. male.   Chief Complaint: c/o chronic numbness and tingling right hand ulnar distribution HPI: Patient presented c/o a 4-6 month history of right elbow pain with numbness and tingling ulnar distribution of the right hand. No history of trauma to the elbow or hand. He awakens 7/7 mornings with symptoms of painful dysesthesias in the hand. He has undergone a multi-level fusion of the cervical spine by Dr. Wynetta Emery in 2012. He is an insulin dependent diabetic for the past 5 years.  Past Medical History  Diagnosis Date  . Hypertension   . Diabetes mellitus   . Hyperlipidemia   . History of colonic polyps   . Bipolar 1 disorder   . OCD (obsessive compulsive disorder)   . Stuttering   . Tremor, essential   . 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 DJD (degenerative joint disease)     lumbar spine  . Basal cell carcinoma     multiple  . Complication of anesthesia     cogative degeneration  . Wears glasses   . Hearing impaired     hearing aids-transmitter rt     Past Surgical History  Procedure Laterality Date  . Cervical fusion  1990  . Cholesteatoma excision  1993  . Other surgical history      benign head tumor#5  . Other surgical history      sebaceous cysts-post neck x5  . Appendectomy  1960  . External ear surgery      multiple ear surguries as a child  . Tonsillectomy    . Collateral ligament repair, elbow      bilateral, nerve improvement left 08/04, right 09/04  . Shoulder arthroscopy w/ acromial repair  08/2004    left shoulder  . Cervical fusion  8/12    multiple levels with plates    Family History  Problem Relation Age of Onset  . OCD Mother   . Bipolar disorder Mother   . Cancer Mother   . OCD Sister   . OCD Sister   . Emphysema Mother     never smoker, worked @ [employer] History:  reports that he quit smoking about 9 years ago. His smoking use included Cigarettes. He has a 20 pack-year smoking history. He has never  used smokeless tobacco. He reports that he does not drink alcohol or use illicit drugs.  Allergies:  Allergies  Allergen Reactions  . Pravastatin     Severe myalgias  . Aripiprazole     Patient can't remember why he can't take it.  . Olanzapine     Can't remember   . Primidone     Can't remember   . Bactrim [Sulfamethoxazole W-Trimethoprim]     mild  . Codeine Phosphate Rash  . Divalproex Sodium Other (See Comments)     tremor  . Lithium Carbonate Other (See Comments)    Tremors     No prescriptions prior to admission    No results found for this or any previous visit (from the past 48 hour(s)).  No results found.   Pertinent items are noted in HPI.  Height 5\' 8"  (1.727 m), weight 81.647 kg (180 lb).  General appearance: alert Head: Normocephalic, without obvious abnormality Neck: supple, symmetrical, trachea midline Resp: clear to auscultation bilaterally Cardio: regular rate and rhythm GI: normal findings: bowel sounds normal Extremities:Exam of the right hand reveals good intrinsic strength right equal to left. He  has a negative Froment's. He has a positive hyperflexion and Tinel's of the right elbow. There is no atrophy in the hand. 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 Pulses: 2+ and symmetric Skin: normal Neurologic: Grossly normal    Assessment/Plan Impression: Ulnar nerve compression right elbow  Plan: To the OR for subcutaneous transposition ulnar nerve right elbow.The procedure, risks,benefits and post-op course were discussed with the patient at length and they were in agreement with the plan.  DASNOIT,Delisha Peaden J 05/12/2013, 2:53 PM   H&P documentation: 05/13/2013  -History and Physical Reviewed  -Patient has been re-examined  -No change in the plan of care  Wyn Forster, MD

## 2013-05-12 NOTE — Progress Notes (Signed)
Pt will need istat and ekg-added on late-

## 2013-05-13 ENCOUNTER — Encounter (HOSPITAL_BASED_OUTPATIENT_CLINIC_OR_DEPARTMENT_OTHER)
Admission: RE | Disposition: A | Discharge status: Home / Self Care | Payer: Self-pay | Source: Ambulatory Visit | Attending: Orthopedic Surgery

## 2013-05-13 ENCOUNTER — Ambulatory Visit (HOSPITAL_BASED_OUTPATIENT_CLINIC_OR_DEPARTMENT_OTHER): Payer: Medicare Other | Admitting: Anesthesiology

## 2013-05-13 ENCOUNTER — Encounter (HOSPITAL_BASED_OUTPATIENT_CLINIC_OR_DEPARTMENT_OTHER): Payer: Self-pay | Admitting: Anesthesiology

## 2013-05-13 ENCOUNTER — Ambulatory Visit (HOSPITAL_BASED_OUTPATIENT_CLINIC_OR_DEPARTMENT_OTHER)
Admission: RE | Admit: 2013-05-13 | Discharge: 2013-05-13 | Disposition: A | Discharge status: Home / Self Care | Payer: Medicare Other | Source: Ambulatory Visit | Attending: Orthopedic Surgery | Admitting: Orthopedic Surgery

## 2013-05-13 DIAGNOSIS — F8081 Childhood onset fluency disorder: Secondary | ICD-10-CM | POA: Insufficient documentation

## 2013-05-13 DIAGNOSIS — Z79899 Other long term (current) drug therapy: Secondary | ICD-10-CM | POA: Insufficient documentation

## 2013-05-13 DIAGNOSIS — Z85828 Personal history of other malignant neoplasm of skin: Secondary | ICD-10-CM | POA: Insufficient documentation

## 2013-05-13 DIAGNOSIS — F319 Bipolar disorder, unspecified: Secondary | ICD-10-CM | POA: Insufficient documentation

## 2013-05-13 DIAGNOSIS — Z888 Allergy status to other drugs, medicaments and biological substances status: Secondary | ICD-10-CM | POA: Insufficient documentation

## 2013-05-13 DIAGNOSIS — Z981 Arthrodesis status: Secondary | ICD-10-CM | POA: Insufficient documentation

## 2013-05-13 DIAGNOSIS — I10 Essential (primary) hypertension: Secondary | ICD-10-CM | POA: Insufficient documentation

## 2013-05-13 DIAGNOSIS — M47817 Spondylosis without myelopathy or radiculopathy, lumbosacral region: Secondary | ICD-10-CM | POA: Insufficient documentation

## 2013-05-13 DIAGNOSIS — G250 Essential tremor: Secondary | ICD-10-CM | POA: Insufficient documentation

## 2013-05-13 DIAGNOSIS — H9190 Unspecified hearing loss, unspecified ear: Secondary | ICD-10-CM | POA: Insufficient documentation

## 2013-05-13 DIAGNOSIS — Z87891 Personal history of nicotine dependence: Secondary | ICD-10-CM | POA: Insufficient documentation

## 2013-05-13 DIAGNOSIS — E119 Type 2 diabetes mellitus without complications: Secondary | ICD-10-CM | POA: Insufficient documentation

## 2013-05-13 DIAGNOSIS — F429 Obsessive-compulsive disorder, unspecified: Secondary | ICD-10-CM | POA: Insufficient documentation

## 2013-05-13 DIAGNOSIS — E785 Hyperlipidemia, unspecified: Secondary | ICD-10-CM | POA: Insufficient documentation

## 2013-05-13 DIAGNOSIS — Z882 Allergy status to sulfonamides status: Secondary | ICD-10-CM | POA: Insufficient documentation

## 2013-05-13 DIAGNOSIS — G5620 Lesion of ulnar nerve, unspecified upper limb: Secondary | ICD-10-CM | POA: Insufficient documentation

## 2013-05-13 HISTORY — PX: ULNAR NERVE TRANSPOSITION: SHX2595

## 2013-05-13 HISTORY — DX: Presence of spectacles and contact lenses: Z97.3

## 2013-05-13 HISTORY — DX: Reserved for inherently not codable concepts without codable children: IMO0001

## 2013-05-13 HISTORY — DX: Adverse effect of unspecified anesthetic, initial encounter: T41.45XA

## 2013-05-13 HISTORY — DX: Unspecified hearing loss, unspecified ear: H91.90

## 2013-05-13 HISTORY — DX: Other complications of anesthesia, initial encounter: T88.59XA

## 2013-05-13 LAB — POCT I-STAT, CHEM 8
BUN: 18 mg/dL (ref 6–23)
Calcium, Ion: 1.09 mmol/L — ABNORMAL LOW (ref 1.13–1.30)
Chloride: 106 mEq/L (ref 96–112)
Creatinine, Ser: 0.9 mg/dL (ref 0.50–1.35)
Glucose, Bld: 112 mg/dL — ABNORMAL HIGH (ref 70–99)

## 2013-05-13 LAB — GLUCOSE, CAPILLARY: Glucose-Capillary: 114 mg/dL — ABNORMAL HIGH (ref 70–99)

## 2013-05-13 SURGERY — ULNAR NERVE DECOMPRESSION/TRANSPOSITION
Anesthesia: General | Site: Elbow | Laterality: Right | Wound class: Clean

## 2013-05-13 MED ORDER — PROMETHAZINE HCL 25 MG/ML IJ SOLN: 6.2500 mg | INTRAMUSCULAR | Status: DC | PRN

## 2013-05-13 MED ORDER — CHLORHEXIDINE GLUCONATE 4 % EX LIQD: 60.0000 mL | Freq: Once | CUTANEOUS | Status: DC

## 2013-05-13 MED ORDER — FENTANYL CITRATE 0.05 MG/ML IJ SOLN: 50.0000 ug | INTRAMUSCULAR | Status: DC | PRN

## 2013-05-13 MED ORDER — PROPOFOL 10 MG/ML IV BOLUS
INTRAVENOUS | Status: DC | PRN
  Administered 2013-05-13 (×2): 50 mg via INTRAVENOUS
  Administered 2013-05-13: 200 mg via INTRAVENOUS

## 2013-05-13 MED ORDER — FENTANYL CITRATE 0.05 MG/ML IJ SOLN
25.0000 ug | INTRAMUSCULAR | Status: DC | PRN
  Administered 2013-05-13 (×2): 25 ug via INTRAVENOUS

## 2013-05-13 MED ORDER — OXYCODONE HCL 5 MG/5ML PO SOLN: 5.0000 mg | Freq: Once | ORAL | Status: AC | PRN

## 2013-05-13 MED ORDER — LIDOCAINE HCL (CARDIAC) 20 MG/ML IV SOLN
INTRAVENOUS | Status: DC | PRN
  Administered 2013-05-13: 100 mg via INTRAVENOUS

## 2013-05-13 MED ORDER — MIDAZOLAM HCL 2 MG/2ML IJ SOLN: 1.0000 mg | INTRAMUSCULAR | Status: DC | PRN

## 2013-05-13 MED ORDER — DEXAMETHASONE SODIUM PHOSPHATE 4 MG/ML IJ SOLN
INTRAMUSCULAR | Status: DC | PRN
  Administered 2013-05-13: 8 mg via INTRAVENOUS

## 2013-05-13 MED ORDER — LIDOCAINE HCL 2 % IJ SOLN
INTRAMUSCULAR | Status: DC | PRN
  Administered 2013-05-13: 6 mL

## 2013-05-13 MED ORDER — LACTATED RINGERS IV SOLN
INTRAVENOUS | Status: DC
  Administered 2013-05-13 (×2): via INTRAVENOUS

## 2013-05-13 MED ORDER — FENTANYL CITRATE 0.05 MG/ML IJ SOLN
INTRAMUSCULAR | Status: DC | PRN
  Administered 2013-05-13: 50 ug via INTRAVENOUS

## 2013-05-13 MED ORDER — OXYCODONE HCL 5 MG PO TABS
5.0000 mg | ORAL_TABLET | Freq: Once | ORAL | Status: AC | PRN
Start: 2013-05-13 — End: 2013-05-13
  Administered 2013-05-13: 5 mg via ORAL

## 2013-05-13 MED ORDER — MIDAZOLAM HCL 2 MG/2ML IJ SOLN: 0.5000 mg | Freq: Once | INTRAMUSCULAR | Status: DC | PRN

## 2013-05-13 MED ORDER — OXYCODONE-ACETAMINOPHEN 5-325 MG PO TABS: ORAL_TABLET | ORAL | Status: DC

## 2013-05-13 MED ORDER — ONDANSETRON HCL 4 MG/2ML IJ SOLN
INTRAMUSCULAR | Status: DC | PRN
  Administered 2013-05-13: 4 mg via INTRAVENOUS

## 2013-05-13 MED ORDER — MEPERIDINE HCL 25 MG/ML IJ SOLN: 6.2500 mg | INTRAMUSCULAR | Status: DC | PRN

## 2013-05-13 SURGICAL SUPPLY — 44 items
BANDAGE ADHESIVE 1X3 (GAUZE/BANDAGES/DRESSINGS) IMPLANT
BANDAGE ELASTIC 3 VELCRO ST LF (GAUZE/BANDAGES/DRESSINGS) IMPLANT
BANDAGE ELASTIC 4 VELCRO ST LF (GAUZE/BANDAGES/DRESSINGS) IMPLANT
BLADE MINI RND TIP GREEN BEAV (BLADE) IMPLANT
BLADE SURG 15 STRL LF DISP TIS (BLADE) ×1 IMPLANT
BLADE SURG 15 STRL SS (BLADE) ×1
BNDG ESMARK 4X9 LF (GAUZE/BANDAGES/DRESSINGS) ×2 IMPLANT
BRUSH SCRUB EZ PLAIN DRY (MISCELLANEOUS) ×2 IMPLANT
CLOTH BEACON ORANGE TIMEOUT ST (SAFETY) ×2 IMPLANT
CORDS BIPOLAR (ELECTRODE) ×2 IMPLANT
COVER MAYO STAND STRL (DRAPES) ×2 IMPLANT
COVER TABLE BACK 60X90 (DRAPES) ×2 IMPLANT
CUFF TOURNIQUET SINGLE 18IN (TOURNIQUET CUFF) IMPLANT
DECANTER SPIKE VIAL GLASS SM (MISCELLANEOUS) ×2 IMPLANT
DRAPE EXTREMITY T 121X128X90 (DRAPE) ×2 IMPLANT
DRAPE SURG 17X23 STRL (DRAPES) ×2 IMPLANT
DRSG TEGADERM 4X4.75 (GAUZE/BANDAGES/DRESSINGS) ×2 IMPLANT
GAUZE SPONGE 4X4 12PLY STRL LF (GAUZE/BANDAGES/DRESSINGS) ×2 IMPLANT
GLOVE BIO SURGEON STRL SZ 6.5 (GLOVE) ×2 IMPLANT
GLOVE BIOGEL M STRL SZ7.5 (GLOVE) IMPLANT
GLOVE BIOGEL PI IND STRL 7.0 (GLOVE) ×2 IMPLANT
GLOVE BIOGEL PI INDICATOR 7.0 (GLOVE) ×2
GLOVE ORTHO TXT STRL SZ7.5 (GLOVE) ×2 IMPLANT
GOWN BRE IMP PREV XXLGXLNG (GOWN DISPOSABLE) ×2 IMPLANT
GOWN PREVENTION PLUS XLARGE (GOWN DISPOSABLE) ×2 IMPLANT
LOOP VESSEL MAXI BLUE (MISCELLANEOUS) IMPLANT
NEEDLE 27GAX1X1/2 (NEEDLE) IMPLANT
PACK BASIN DAY SURGERY FS (CUSTOM PROCEDURE TRAY) ×2 IMPLANT
PADDING CAST ABS 4INX4YD NS (CAST SUPPLIES) ×1
PADDING CAST ABS COTTON 4X4 ST (CAST SUPPLIES) ×1 IMPLANT
SLEEVE SCD COMPRESS KNEE MED (MISCELLANEOUS) ×2 IMPLANT
SPONGE GAUZE 4X4 12PLY (GAUZE/BANDAGES/DRESSINGS) ×2 IMPLANT
STOCKINETTE 4X48 STRL (DRAPES) ×2 IMPLANT
STRIP CLOSURE SKIN 1/2X4 (GAUZE/BANDAGES/DRESSINGS) ×2 IMPLANT
SUT PROLENE 3 0 PS 2 (SUTURE) ×2 IMPLANT
SUT VIC AB 3-0 SH 27 (SUTURE)
SUT VIC AB 3-0 SH 27XBRD (SUTURE) IMPLANT
SUT VIC AB 4-0 P-3 18XBRD (SUTURE) IMPLANT
SUT VIC AB 4-0 P3 18 (SUTURE)
SYR 3ML 23GX1 SAFETY (SYRINGE) IMPLANT
SYR BULB 3OZ (MISCELLANEOUS) ×2 IMPLANT
SYR CONTROL 10ML LL (SYRINGE) ×2 IMPLANT
TOWEL OR 17X24 6PK STRL BLUE (TOWEL DISPOSABLE) ×4 IMPLANT
UNDERPAD 30X30 INCONTINENT (UNDERPADS AND DIAPERS) ×2 IMPLANT

## 2013-05-13 NOTE — Transfer of Care (Signed)
Immediate Anesthesia Transfer of Care Note  Patient: Walter Harris  Procedure(s) Performed: Procedure(s): RIGHT ULNAR NERVE DECOMPRESSION/TRANSPOSITION (Right)  Patient Location: PACU  Anesthesia Type:General  Level of Consciousness: sedated  Airway & Oxygen Therapy: Patient Spontanous Breathing and Patient connected to face mask oxygen  Post-op Assessment: Report given to PACU RN and Post -op Vital signs reviewed and stable  Post vital signs: Reviewed and stable  Complications: No apparent anesthesia complications

## 2013-05-13 NOTE — Brief Op Note (Signed)
05/13/2013  11:48 AM  PATIENT:  Cartier L Knierim  67 y.o. male  PRE-OPERATIVE DIAGNOSIS:  RIGHT ULNAR NERVE ENTRAPMENT AT CUBITAL TUNNEL  POST-OPERATIVE DIAGNOSIS:  RIGHT ULNAR NERVE ENTRAPMENT AT CUBITAL TUNNEL  PROCEDURE:  Procedure(s): RIGHT ULNAR NERVE DECOMPRESSION/TRANSPOSITION (Right)  SURGEON:  Surgeon(s) and Role:    * Wyn Forster., MD - Primary  PHYSICIAN ASSISTANT:   ASSISTANTS: surgical technician  ANESTHESIA:   general  EBL:  Total I/O In: 1500 [I.V.:1500] Out: -   BLOOD ADMINISTERED:none  DRAINS: none   LOCAL MEDICATIONS USED:  LIDOCAINE   SPECIMEN:  No Specimen  DISPOSITION OF SPECIMEN:  N/A  COUNTS:  YES  TOURNIQUET:   Total Tourniquet Time Documented: Upper Arm (Right) - 74 minutes Total: Upper Arm (Right) - 74 minutes   DICTATION: .Other Dictation: Dictation Number [id number]  PLAN OF CARE: Discharge to home after PACU  PATIENT DISPOSITION:  PACU - hemodynamically stable.   Delay start of Pharmacological VTE agent (>24hrs) due to surgical blood loss or risk of bleeding: not applicable

## 2013-05-13 NOTE — Anesthesia Procedure Notes (Signed)
Procedure Name: LMA Insertion Date/Time: 05/13/2013 10:13 AM Performed by: Caren Macadam Pre-anesthesia Checklist: Patient identified, Emergency Drugs available, Suction available and Patient being monitored Patient Re-evaluated:Patient Re-evaluated prior to inductionOxygen Delivery Method: Circle System Utilized Preoxygenation: Pre-oxygenation with 100% oxygen Intubation Type: IV induction Ventilation: Mask ventilation without difficulty LMA: LMA inserted LMA Size: 5.0 Number of attempts: 1 Airway Equipment and Method: bite block Placement Confirmation: positive ETCO2 and breath sounds checked- equal and bilateral Tube secured with: Tape Dental Injury: Teeth and Oropharynx as per pre-operative assessment

## 2013-05-13 NOTE — Anesthesia Preprocedure Evaluation (Addendum)
Anesthesia Evaluation  Patient identified by MRN, date of birth, ID band Patient awake    Reviewed: Allergy & Precautions, H&P , NPO status , Patient's Chart, lab work & pertinent test results, reviewed documented beta blocker date and time   History of Anesthesia Complications (+) Emergence Delirium  Airway Mallampati: II TM Distance: >3 FB Neck ROM: Full    Dental  (+) Edentulous Upper, Poor Dentition, Missing and Dental Advisory Given   Pulmonary former smoker (quit '05),  breath sounds clear to auscultation  Pulmonary exam normal       Cardiovascular hypertension, Pt. on medications and Pt. on home beta blockers Rhythm:Regular Rate:Normal     Neuro/Psych PSYCHIATRIC DISORDERS Bipolar Disorder    GI/Hepatic negative GI ROS, Neg liver ROS,   Endo/Other  diabetes (glu 112), Well Controlled, Type 2, Insulin Dependent and Oral Hypoglycemic Agents  Renal/GU negative Renal ROS     Musculoskeletal   Abdominal   Peds  Hematology   Anesthesia Other Findings   Reproductive/Obstetrics                        Anesthesia Physical Anesthesia Plan  ASA: III  Anesthesia Plan: General   Post-op Pain Management:    Induction: Intravenous  Airway Management Planned: LMA  Additional Equipment:   Intra-op Plan:   Post-operative Plan:   Informed Consent: I have reviewed the patients History and Physical, chart, labs and discussed the procedure including the risks, benefits and alternatives for the proposed anesthesia with the patient or authorized representative who has indicated his/her understanding and acceptance.   Dental advisory given  Plan Discussed with: CRNA and Surgeon  Anesthesia Plan Comments: (Plan routine monitors, GA- LMA OK)        Anesthesia Quick Evaluation

## 2013-05-13 NOTE — Op Note (Signed)
[id number] 

## 2013-05-13 NOTE — Anesthesia Postprocedure Evaluation (Signed)
  Anesthesia Post-op Note  Patient: Walter Harris  Procedure(s) Performed: Procedure(s): RIGHT ULNAR NERVE DECOMPRESSION/TRANSPOSITION (Right)  Patient Location: PACU  Anesthesia Type:General  Level of Consciousness: awake, alert , oriented and patient cooperative  Airway and Oxygen Therapy: Patient Spontanous Breathing  Post-op Pain: none  Post-op Assessment: Post-op Vital signs reviewed, Patient's Cardiovascular Status Stable, Respiratory Function Stable, Patent Airway, No signs of Nausea or vomiting and Pain level controlled  Post-op Vital Signs: Reviewed and stable  Complications: No apparent anesthesia complications

## 2013-05-14 NOTE — Op Note (Signed)
NAMELANCELOT, ALYEA NO.:  [id number]  MEDICAL RECORD NO.:  [id number]  LOCATION:                               FACILITY:  [facility]  PHYSICIAN:  Katy Fitch. Aynsley Fleet, M.D. DATE OF BIRTH:  1945-10-02  DATE OF PROCEDURE:  05/13/2013 DATE OF DISCHARGE:  05/13/2013                              OPERATIVE REPORT   PREOPERATIVE DIAGNOSES:  Severe right ulnar entrapment neuropathy, cubital tunnel, status post prior in situ decompression in 2004; also background diabetes and multiple medical problems.  POSTOPERATIVE DIAGNOSES:  Severe right ulnar entrapment neuropathy, cubital tunnel, status post prior in situ decompression in 2004; also background diabetes and multiple medical problems.  OPERATION:  Right ulnar nerve neurolysis from 6 cm proximal to cubital tunnel to 6 cm distally followed by anterior subcutaneous transposition with development of a myofascial retaining wall.  OPERATING SURGEON:  Katy Fitch. Kemisha Bonnette, MD  ASSISTANCE:  Surgical technician.  ANESTHESIA:  General by LMA.  SUPERVISING ANESTHESIOLOGIST:  Dr. Jean Rosenthal.  INDICATIONS:  Walter Harris is a well known 67 year old gentleman who is status post bilateral shoulder surgery and an in situ decompression of the right ulnar nerve at the cubital tunnel in 2004.  He did quite well for more than 8 years, but following multiple spinal procedures, noted recurrent discomfort at his medial elbow and numbness in his ulnar innervated fingers and weakness of pinch and grasp.  Returned for followup evaluation, was noted to have a Tinel sign at the cubital tunnel on an elbow hyperflexion test.  Electrodiagnostic studies repeated by Dr. Wadie Lessen, which revealed a conduction velocity across the elbow in the low 30 meter/second range.  We advised Walter Harris to undergo exploration and transposition.  Preoperatively, we advised him of the potential risks and benefits of surgery.  With his diabetes, he has certainly  increased infection risk. He understands we cannot guarantee complete recovery of his motor and sensory function with his significant neuropathy.  DESCRIPTION OF PROCEDURE:  Walter Harris was interviewed in the holding area by Dr. Jean Rosenthal of Anesthesia and advised to undergo general anesthesia by LMA technique.  In room 6 under Walter Harris direct supervision, general anesthesia by LMA technique was induced followed by routine Betadine scrub and paint of the right upper extremity.  A pneumatic tourniquet was applied to the proximal right brachium.  Following exsanguination of the right arm with Esmarch bandage, the arterial tourniquet was inflated to 220 mmHg.  Following routine surgical time-out, we proceeded with excision of prior surgical scar and extension to a conventional curvilinear scar, paralleling the path of the ulnar nerve posterior to the epicondyle.  Subcutaneous tissues were carefully divided, taking care to identify and spare the posterior branch of the medial antebrachial cutaneous nerve.  The ulnar nerve was noted to be partially subluxed, proximal to the medial epicondyle and there was profound scarring of the fascia at the head of flexor carpi ulnaris.  There was a very significant compression of the nerve just distal to the epicondyle.  Neurolysis of the nerve was performed with loupe magnification from 6 cm above the epicondyle to 6 cm distally.  The articular branch was gently separated from the nerve  and preserved.  The nerve was meticulously transposed to the flexor pronator fascia, taking care to remove the fascia of the flexor carpi ulnaris, exposing only muscle at the turning point where it entered the flexor carpi ulnaris.  Once the nerve was transposed, we completed a myofascial retaining wall formed with remnants of the arcuate ligament, and the subcutaneous tissues of the proximal brachial skin.  Great care was taken to ensure that the nerve did not  kink with range of motion 0-140 degrees of flexion.  We assured that there were no tight spots beneath the fascia.  Hemostasis was achieved with bipolar forceps followed by irrigation of the wound and repair of the skin with subcutaneous 4-0 Vicryl and intradermal 4-0 Prolene with Steri-Strips.  A compressive dressing was applied with sterile gauze and Tegaderm followed by a stockinette dressing.  For aftercare, Walter Harris is provided a prescription for Percocet 5 mg 1- 2 tablets p.o. q.4-6 hours p.r.n. pain, 30 tablets without refill. Also, he is encouraged to use over-the-counter analgesics such as Aleve as needed.     Katy Fitch Erez Mccallum, M.D.   ______________________________ Katy Fitch. Raquell Richer, M.D.    RVS/MEDQ  D:  05/13/2013  T:  05/13/2013  Job:  [id number]

## 2013-05-17 ENCOUNTER — Encounter (HOSPITAL_BASED_OUTPATIENT_CLINIC_OR_DEPARTMENT_OTHER): Payer: Self-pay | Admitting: Orthopedic Surgery

## 2013-07-15 ENCOUNTER — Other Ambulatory Visit: Payer: Self-pay

## 2013-08-02 ENCOUNTER — Other Ambulatory Visit: Payer: Self-pay | Admitting: Neurosurgery

## 2013-08-02 DIAGNOSIS — M545 Low back pain: Secondary | ICD-10-CM

## 2013-08-09 ENCOUNTER — Other Ambulatory Visit: Payer: Medicare Other

## 2013-08-17 ENCOUNTER — Other Ambulatory Visit: Payer: Medicare Other

## 2013-09-13 ENCOUNTER — Telehealth: Payer: Self-pay | Admitting: Internal Medicine

## 2013-09-13 ENCOUNTER — Emergency Department: Payer: Self-pay | Admitting: Emergency Medicine

## 2013-09-13 LAB — COMPREHENSIVE METABOLIC PANEL
ALK PHOS: 74 U/L
Albumin: 3.6 g/dL (ref 3.4–5.0)
Anion Gap: 6 — ABNORMAL LOW (ref 7–16)
BUN: 18 mg/dL (ref 7–18)
Bilirubin,Total: 0.5 mg/dL (ref 0.2–1.0)
Calcium, Total: 9.1 mg/dL (ref 8.5–10.1)
Chloride: 106 mmol/L (ref 98–107)
Co2: 26 mmol/L (ref 21–32)
Creatinine: 1.01 mg/dL (ref 0.60–1.30)
GLUCOSE: 162 mg/dL — AB (ref 65–99)
Osmolality: 281 (ref 275–301)
POTASSIUM: 4 mmol/L (ref 3.5–5.1)
SGOT(AST): 21 U/L (ref 15–37)
SGPT (ALT): 23 U/L (ref 12–78)
SODIUM: 138 mmol/L (ref 136–145)
Total Protein: 6.9 g/dL (ref 6.4–8.2)

## 2013-09-13 LAB — CBC
HCT: 44.8 % (ref 40.0–52.0)
HGB: 14.9 g/dL (ref 13.0–18.0)
MCH: 30.6 pg (ref 26.0–34.0)
MCHC: 33.3 g/dL (ref 32.0–36.0)
MCV: 92 fL (ref 80–100)
PLATELETS: 184 10*3/uL (ref 150–440)
RBC: 4.87 10*6/uL (ref 4.40–5.90)
RDW: 14.3 % (ref 11.5–14.5)
WBC: 5.7 10*3/uL (ref 3.8–10.6)

## 2013-09-13 LAB — CK TOTAL AND CKMB (NOT AT ARMC)
CK, Total: 218 U/L (ref 35–232)
CK-MB: 2.8 ng/mL (ref 0.5–3.6)

## 2013-09-13 LAB — TROPONIN I: Troponin-I: <0.02 ng/mL

## 2013-09-13 NOTE — Telephone Encounter (Signed)
Patient switched to Walter Harris.  He said he had a falling out with Dr.Letvak and found a new doctor.  He said they don't answer the phone at the new doctor's office and he wants to come back and see Dr.Letvak.  He's having a pain under his rib cage.  Please advise.

## 2013-09-13 NOTE — Telephone Encounter (Signed)
I am okay with seeing him still You can set up an appointment

## 2013-09-13 NOTE — Telephone Encounter (Signed)
I spoke with patient's wife.  Patient had called EMS because he didn't know if he would be able to be seen here.  Patient's wife scheduled an appointment on 09/17/13 at 12:00.

## 2013-09-17 ENCOUNTER — Telehealth: Payer: Self-pay

## 2013-09-17 ENCOUNTER — Ambulatory Visit (INDEPENDENT_AMBULATORY_CARE_PROVIDER_SITE_OTHER): Payer: Medicare Other | Admitting: Internal Medicine

## 2013-09-17 ENCOUNTER — Encounter: Payer: Self-pay | Admitting: Internal Medicine

## 2013-09-17 VITALS — BP 148/80 | HR 74 | Temp 98.2°F | Wt 205.0 lb

## 2013-09-17 DIAGNOSIS — E1129 Type 2 diabetes mellitus with other diabetic kidney complication: Secondary | ICD-10-CM

## 2013-09-17 DIAGNOSIS — R05 Cough: Secondary | ICD-10-CM | POA: Insufficient documentation

## 2013-09-17 DIAGNOSIS — R911 Solitary pulmonary nodule: Secondary | ICD-10-CM

## 2013-09-17 DIAGNOSIS — R059 Cough, unspecified: Secondary | ICD-10-CM

## 2013-09-17 DIAGNOSIS — F319 Bipolar disorder, unspecified: Secondary | ICD-10-CM

## 2013-09-17 DIAGNOSIS — I10 Essential (primary) hypertension: Secondary | ICD-10-CM

## 2013-09-17 NOTE — Progress Notes (Signed)
Pre-visit discussion using our clinic review tool. No additional management support is needed unless otherwise documented below in the visit note.  

## 2013-09-17 NOTE — Progress Notes (Signed)
Subjective:    Patient ID: Walter Harris, male    DOB: Apr 06, 1946, 68 y.o.   MRN: [id number]  HPI Here with wife  Started on lisinopril 2-3 weeks ago---for renal protection Then started hacking and having cramps in thighs Cough is dry but constant So bad his chest is sore---LLL to back  Called rescue--worried about gallbladder ER evaluation negative--ultrasound fine CXR had ?nodule on right  Stopped the lisinopril after only a few days Cough now finally easing up Finishing prednisone and taking delsym   Stopped with Dr Vevelyn Royals doesn't cover him Last A1c 7.2 or so in November  Had tried new doctor-- Dr Sanda Klein at [facility] satisfied there  Mood is better Not seeing Dr Bridgett Larsson anymore Wife feels he is the best in years off all the medications  Current Outpatient Prescriptions on File Prior to Visit  Medication Sig Dispense Refill  . ALPRAZolam (XANAX) 0.5 MG tablet Take 0.5 mg by mouth 2 (two) times daily.       Marland Kitchen aspirin 81 MG tablet Take one by mouth once a day       . carisoprodol (SOMA) 350 MG tablet Take 0.5-1 tablets (175-350 mg total) by mouth at bedtime as needed for muscle spasms.  30 tablet  0  . glucose blood test strip Ascensia Contour (Microfill) m/mm  100 each  11  . glucose blood test strip Use as instructed to test blood sugar 4 times daily, dx: 250.02      . insulin glargine (LANTUS) 100 UNIT/ML injection Inject 24 Units into the skin daily.       . INVOKANA 300 MG TABS Take 150 mg by mouth daily.       . metFORMIN (GLUCOPHAGE) 500 MG tablet Take 1,000 mg by mouth 2 (two) times daily with a meal.       . oxyCODONE-acetaminophen (PERCOCET/ROXICET) 5-325 MG per tablet Take one or 2 tablets every 4-6 hours as needed for postsurgical pain  30 tablet  0  . pioglitazone (ACTOS) 30 MG tablet Take 30 mg by mouth daily.       . propranolol (INDERAL) 10 MG tablet Take 10 mg by mouth 2 (two) times daily.       No current facility-administered  medications on file prior to visit.    Allergies  Allergen Reactions  . Pravastatin     Severe myalgias  . Aripiprazole     Patient can't remember why he can't take it.  . Olanzapine     Can't remember   . Primidone     Can't remember   . Bactrim [Sulfamethoxazole-Trimethoprim]     mild  . Codeine Phosphate Rash  . Divalproex Sodium Other (See Comments)     tremor  . Lithium Carbonate Other (See Comments)    Tremors     Past Medical History  Diagnosis Date  . Hypertension   . Diabetes mellitus   . Hyperlipidemia   . History of colonic polyps   . Bipolar 1 disorder   . OCD (obsessive compulsive disorder)   . Stuttering   . Tremor, essential   . 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 DJD (degenerative joint disease)     lumbar spine  . Basal cell carcinoma     multiple  . Wears glasses   . Hearing impaired     hearing aids-transmitter rt   . 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 Cervical fusion  1990  . Cholesteatoma excision  1993  . Other surgical history      benign head tumor#5  . Other surgical history      sebaceous cysts-post neck x5  . Appendectomy  1960  . External ear surgery      multiple ear surguries as a child  . Tonsillectomy    . Collateral ligament repair, elbow      bilateral, nerve improvement left 08/04, right 09/04  . Shoulder arthroscopy w/ acromial repair  08/2004    left shoulder  . Cervical fusion  8/12    multiple levels with plates  . Ulnar nerve transposition Right 05/13/2013    Procedure: RIGHT ULNAR NERVE DECOMPRESSION/TRANSPOSITION;  Surgeon: [name]., MD;  Location: [place];  Service: Orthopedics;  Laterality: Right;    Family History  Problem Relation Age of Onset  . OCD Mother   . Bipolar disorder Mother   . Cancer Mother   . OCD Sister   . OCD Sister   . Emphysema Mother     never smoker, worked @  [employer]    History   Social History  . Marital Status: Married    Spouse Name: N/A    Number of Children: 1  . Years of Education: N/A   Occupational History  . disabled    Social History Main Topics  . Smoking status: Former Smoker -- 0.50 packs/day for 40 years    Types: Cigarettes    Quit date: 09/10/2003  . Smokeless tobacco: Never Used  . Alcohol Use: No  . Drug Use: No  . Sexual Activity: Not on file   Other Topics Concern  . Not on file   Social History Narrative  . No narrative on file   Review of Systems No SOB No fever Rarely takes the percocet-- for his back     Objective:   Physical Exam  Constitutional: He appears well-developed and well-nourished. No distress.  HENT:  Mouth/Throat: Oropharynx is clear and moist. No oropharyngeal exudate.  Neck: Normal range of motion. Neck supple. No thyromegaly present.  Cardiovascular: Normal rate, regular rhythm and normal heart sounds.  Exam reveals no gallop.   No murmur heard. Pulmonary/Chest: Effort normal and breath sounds normal. No respiratory distress. He has no wheezes. He has no rales.  Lymphadenopathy:    He has no cervical adenopathy.          Assessment & Plan:

## 2013-09-17 NOTE — Telephone Encounter (Signed)
Please set up a repeat CXR next week here to evaluate possible lung nodule seen on CXR If still present, we will proceed with the CT scan

## 2013-09-17 NOTE — Assessment & Plan Note (Signed)
Seems to be quiet off all meds

## 2013-09-17 NOTE — Assessment & Plan Note (Signed)
Clearly seems to be ACEI related Finally improving

## 2013-09-17 NOTE — Patient Instructions (Signed)
Please get your records from Dr Sanda Klein

## 2013-09-17 NOTE — Assessment & Plan Note (Signed)
Questionable Will recheck CXR at next visit. CT if persistent

## 2013-09-17 NOTE — Assessment & Plan Note (Signed)
BP Readings from Last 3 Encounters:  09/17/13 148/80  05/13/13 129/66  05/13/13 129/66   Generally okay Will check labs and consider ARB

## 2013-09-17 NOTE — Telephone Encounter (Signed)
Pt left v/m; pt was seen today and pt has decided that he wants to proceed with CXR and CT for lung nodule.pt does not want to wait to have testing done. Pt request cb.

## 2013-09-17 NOTE — Telephone Encounter (Signed)
Spoke with patient's wife and advised results Pt will call on Monday to schedule x-ray

## 2013-09-17 NOTE — Assessment & Plan Note (Signed)
Had albuminuria  Didn't tolerate ACEI Will check labs from Dr Sanda Klein Consider ARB at next visit

## 2013-09-23 ENCOUNTER — Telehealth: Payer: Self-pay

## 2013-09-23 NOTE — Telephone Encounter (Signed)
Relevant patient education assigned to patient using Emmi. ° °

## 2013-10-01 ENCOUNTER — Other Ambulatory Visit: Payer: Self-pay | Admitting: *Deleted

## 2013-10-01 NOTE — Telephone Encounter (Signed)
rx written by Dr.Melinda Lada, last filled 08/11/13

## 2013-10-02 NOTE — Telephone Encounter (Signed)
Okay #30 x 0 

## 2013-10-04 MED ORDER — CARISOPRODOL 350 MG PO TABS: 350.0000 mg | ORAL_TABLET | Freq: Three times a day (TID) | ORAL | Status: DC | PRN

## 2013-10-04 NOTE — Telephone Encounter (Signed)
rx called into pharmacy

## 2013-10-08 LAB — HM DIABETES EYE EXAM

## 2013-10-21 ENCOUNTER — Telehealth: Payer: Self-pay | Admitting: Internal Medicine

## 2013-10-21 NOTE — Telephone Encounter (Signed)
I would recommend that he try trazodone 50mg  nightly for a week, then increase to 100mg  at night if needed  Trazodone 50mg  #60 x 5

## 2013-10-21 NOTE — Telephone Encounter (Signed)
Pt left voicemail with triage requesting a Rx to help him sleep. Pt said he has "so many aches and pains maybe from getting old" that he is pacing the floors at night. Pt said he has tried all the OTC sleep aids and nothing is helping. Pt request Rx sent to pharmacy on file and request call back

## 2013-10-22 MED ORDER — TRAZODONE HCL 50 MG PO TABS: 50.0000 mg | ORAL_TABLET | Freq: Every day | ORAL | Status: DC

## 2013-10-22 NOTE — Telephone Encounter (Signed)
Spoke with patient and advised results rx sent to pharmacy by e-script  

## 2013-10-27 ENCOUNTER — Other Ambulatory Visit: Payer: Self-pay | Admitting: Family Medicine

## 2013-10-27 MED ORDER — ALPRAZOLAM 0.5 MG PO TABS: 0.5000 mg | ORAL_TABLET | Freq: Two times a day (BID) | ORAL | Status: DC

## 2013-10-27 NOTE — Telephone Encounter (Signed)
Last filled 04/07/13

## 2013-10-27 NOTE — Telephone Encounter (Signed)
Okay #60 x 0 

## 2013-10-27 NOTE — Telephone Encounter (Signed)
rx called into pharmacy

## 2013-11-05 ENCOUNTER — Other Ambulatory Visit: Payer: Self-pay | Admitting: *Deleted

## 2013-11-05 NOTE — Telephone Encounter (Signed)
Last filled 10/04/13 

## 2013-11-06 NOTE — Telephone Encounter (Signed)
Okay #30 x 0 He needs to cancel one of the 2 upcoming appointments

## 2013-11-08 ENCOUNTER — Ambulatory Visit (INDEPENDENT_AMBULATORY_CARE_PROVIDER_SITE_OTHER): Payer: Medicare Other | Admitting: Internal Medicine

## 2013-11-08 ENCOUNTER — Encounter: Payer: Self-pay | Admitting: Internal Medicine

## 2013-11-08 VITALS — BP 150/80 | HR 77 | Temp 98.1°F | Wt 203.0 lb

## 2013-11-08 DIAGNOSIS — I10 Essential (primary) hypertension: Secondary | ICD-10-CM

## 2013-11-08 DIAGNOSIS — E1129 Type 2 diabetes mellitus with other diabetic kidney complication: Secondary | ICD-10-CM

## 2013-11-08 DIAGNOSIS — IMO0002 Reserved for concepts with insufficient information to code with codable children: Secondary | ICD-10-CM

## 2013-11-08 MED ORDER — LOSARTAN POTASSIUM 50 MG PO TABS: 50.0000 mg | ORAL_TABLET | Freq: Every day | ORAL | Status: DC

## 2013-11-08 MED ORDER — CARISOPRODOL 350 MG PO TABS: 350.0000 mg | ORAL_TABLET | Freq: Three times a day (TID) | ORAL | Status: DC | PRN

## 2013-11-08 NOTE — Assessment & Plan Note (Signed)
Causing his leg weakness Discussed increasing his exercises--- needs to increase use of weights Can't go to gym--- will try to increase the HEP

## 2013-11-08 NOTE — Progress Notes (Signed)
Subjective:    Patient ID: Walter Harris, male    DOB: 04/13/1946, 68 y.o.   MRN: [id number]  HPI Has pain every day Now his legs "just turn to jello" Hard to even hold him up at times--may just be standing up Gets some pain in hips and then weakness Can't go out to stores like [employer] due to weakness---is now limited  Has had MRI in past Saw Dr Cram---had been offerred rod placement Has had PT before---hasn't really helped him Does try to do home exercise program---seems focused on cervical spine though Has walker but doesn't use it all the time  Sugars seem okay 91 this AM--generally okay No hypoglycemic reactions  BP is still up some Cough mostly gone from lisinopril--only when he lies down  Current Outpatient Prescriptions on File Prior to Visit  Medication Sig Dispense Refill  . ALPRAZolam (XANAX) 0.5 MG tablet Take 1 tablet (0.5 mg total) by mouth 2 (two) times daily.  60 tablet  0  . aspirin 81 MG tablet Take one by mouth once a day       . carisoprodol (SOMA) 350 MG tablet Take 1 tablet (350 mg total) by mouth 3 (three) times daily as needed for muscle spasms.  30 tablet  0  . glucose blood test strip Use as instructed to test blood sugar 4 times daily, dx: 250.02      . insulin glargine (LANTUS) 100 UNIT/ML injection Inject 24 Units into the skin daily.       . INVOKANA 300 MG TABS Take 150 mg by mouth daily.       . metFORMIN (GLUCOPHAGE) 500 MG tablet Take 1,000 mg by mouth 2 (two) times daily with a meal.       . oxyCODONE-acetaminophen (PERCOCET/ROXICET) 5-325 MG per tablet Take one or 2 tablets every 4-6 hours as needed for postsurgical pain  30 tablet  0  . pioglitazone (ACTOS) 30 MG tablet Take 30 mg by mouth daily.       . propranolol (INDERAL) 10 MG tablet Take 10 mg by mouth 2 (two) times daily.      . traZODone (DESYREL) 50 MG tablet Take 1-2 tablets (50-100 mg total) by mouth at bedtime.  60 tablet  5   No current facility-administered medications on file  prior to visit.    Allergies  Allergen Reactions  . Pravastatin     Severe myalgias  . Aripiprazole     Patient can't remember why he can't take it.  . Olanzapine     Can't remember   . Primidone     Can't remember   . Bactrim [Sulfamethoxazole-Trimethoprim]     mild  . Codeine Phosphate Rash  . Divalproex Sodium Other (See Comments)     tremor  . Lithium Carbonate Other (See Comments)    Tremors     Past Medical History  Diagnosis Date  . Hypertension   . Diabetes mellitus   . Hyperlipidemia   . History of colonic polyps   . Bipolar 1 disorder   . OCD (obsessive compulsive disorder)   . Stuttering   . Tremor, essential   . 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 DJD (degenerative joint disease)     lumbar spine  . Basal cell carcinoma     multiple  . Wears glasses   . Hearing impaired     hearing aids-transmitter rt   . 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 Cervical fusion  1990  . Cholesteatoma excision  1993  . Other surgical history      benign head tumor#5  . Other surgical history      sebaceous cysts-post neck x5  . Appendectomy  1960  . External ear surgery      multiple ear surguries as a child  . Tonsillectomy    . Collateral ligament repair, elbow      bilateral, nerve improvement left 08/04, right 09/04  . Shoulder arthroscopy w/ acromial repair  08/2004    left shoulder  . Cervical fusion  8/12    multiple levels with plates  . Ulnar nerve transposition Right 05/13/2013    Procedure: RIGHT ULNAR NERVE DECOMPRESSION/TRANSPOSITION;  Surgeon: [name]., MD;  Location: [place];  Service: Orthopedics;  Laterality: Right;    Family History  Problem Relation Age of Onset  . OCD Mother   . Bipolar disorder Mother   . Cancer Mother   . OCD Sister   . OCD Sister   . Emphysema Mother     never smoker, 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     History   Social History  . Marital Status: Married    Spouse Name: N/A    Number of Children: 1  . Years of Education: N/A   Occupational History  . disabled    Social History Main Topics  . Smoking status: Former Smoker -- 0.50 packs/day for 40 years    Types: Cigarettes    Quit date: 09/10/2003  . Smokeless tobacco: Never Used  . Alcohol Use: No  . Drug Use: No  . Sexual Activity: Not on file   Other Topics Concern  . Not on file   Social History Narrative  . No narrative on file   Review of Systems Sleeps okay--didn't want to try trazodone. Using melatonin and this is helping---takes 10mg      Objective:   Physical Exam  Constitutional: He appears well-developed and well-nourished. No distress.  Musculoskeletal:  Needs to push up to get out of chair  Neurological:  Mild weakness in hip flexors > extensors Gait is slow          Assessment & Plan:

## 2013-11-08 NOTE — Assessment & Plan Note (Signed)
Seems to be well controlled Will check A1c

## 2013-11-08 NOTE — Assessment & Plan Note (Signed)
BP Readings from Last 3 Encounters:  11/08/13 150/80  09/17/13 148/80  05/13/13 129/66   Will add low dose ARB

## 2013-11-08 NOTE — Telephone Encounter (Signed)
rx called into pharmacy Pt coming in today

## 2013-11-08 NOTE — Progress Notes (Signed)
Pre visit review using our clinic review tool, if applicable. No additional management support is needed unless otherwise documented below in the visit note. 

## 2013-11-09 ENCOUNTER — Telehealth: Payer: Self-pay | Admitting: Internal Medicine

## 2013-11-09 LAB — COMPREHENSIVE METABOLIC PANEL
ALBUMIN: 3.9 g/dL (ref 3.5–5.2)
ALK PHOS: 62 U/L (ref 39–117)
ALT: 16 U/L (ref 0–53)
AST: 18 U/L (ref 0–37)
BILIRUBIN TOTAL: 0.6 mg/dL (ref 0.3–1.2)
BUN: 16 mg/dL (ref 6–23)
CO2: 29 mEq/L (ref 19–32)
Calcium: 9.3 mg/dL (ref 8.4–10.5)
Chloride: 104 mEq/L (ref 96–112)
Creatinine, Ser: 1.1 mg/dL (ref 0.4–1.5)
GFR: 72.5 mL/min (ref >60.00)
GLUCOSE: 116 mg/dL — AB (ref 70–99)
POTASSIUM: 4.4 meq/L (ref 3.5–5.1)
Sodium: 138 mEq/L (ref 135–145)
Total Protein: 6.8 g/dL (ref 6.0–8.3)

## 2013-11-09 LAB — LIPID PANEL
CHOL/HDL RATIO: 4
CHOLESTEROL: 184 mg/dL (ref 0–200)
HDL: 45.6 mg/dL (ref >39.00)
LDL CALC: 116 mg/dL — AB (ref 0–99)
TRIGLYCERIDES: 114 mg/dL (ref 0.0–149.0)
VLDL: 22.8 mg/dL (ref 0.0–40.0)

## 2013-11-09 LAB — HEMOGLOBIN A1C: Hgb A1c MFr Bld: 7.8 % — ABNORMAL HIGH (ref 4.6–6.5)

## 2013-11-09 NOTE — Telephone Encounter (Signed)
Relevant patient education assigned to patient using Emmi. ° °

## 2013-11-17 ENCOUNTER — Ambulatory Visit: Payer: Medicare Other | Admitting: Internal Medicine

## 2013-11-24 ENCOUNTER — Telehealth: Payer: Self-pay

## 2013-11-24 NOTE — Telephone Encounter (Signed)
Pt request 90 day rx for actos to [facility] instead of 30 day. Spoke with [facility] and pt has 90 day refill available from Dr Legrand Como Altheimer.pt voiced understanding.

## 2013-12-07 ENCOUNTER — Telehealth: Payer: Self-pay

## 2013-12-07 NOTE — Telephone Encounter (Signed)
Pt left v/m; pt said losartan is causing cramps and coughing a lot since taking med. Pt cannot sleep at night due to horrible cramps. Pt stopped taking med 12/06/13. Pt request cb.[facility]

## 2013-12-07 NOTE — Telephone Encounter (Signed)
Spoke with patient and he will try again

## 2013-12-07 NOTE — Telephone Encounter (Signed)
Ask him to retry after a few days---but only take 1/2 tab (25mg ) If the symptoms recur, he can stop and I will prescribe something else The symptoms may be dose dependent and this is the best medicine to protect his kidneys and heart

## 2013-12-09 ENCOUNTER — Other Ambulatory Visit: Payer: Self-pay | Admitting: *Deleted

## 2013-12-09 MED ORDER — ALPRAZOLAM 0.5 MG PO TABS: 0.5000 mg | ORAL_TABLET | Freq: Two times a day (BID) | ORAL | Status: DC

## 2013-12-09 NOTE — Telephone Encounter (Signed)
rx called into pharmacy

## 2013-12-09 NOTE — Telephone Encounter (Signed)
Last filled 10/27/2013

## 2013-12-09 NOTE — Telephone Encounter (Signed)
Pt left v/m status of refill. Left v/m notifiying pt rx called to [facility].

## 2013-12-09 NOTE — Telephone Encounter (Signed)
Okay #60 x 0 

## 2013-12-14 ENCOUNTER — Other Ambulatory Visit: Payer: Self-pay | Admitting: *Deleted

## 2013-12-14 MED ORDER — GABAPENTIN 300 MG PO CAPS: 600.0000 mg | ORAL_CAPSULE | Freq: Two times a day (BID) | ORAL | Status: DC

## 2013-12-14 NOTE — Telephone Encounter (Signed)
Mrs Petsch request cb with gabapentin refill status; pt is out of med. Spoke with Mrs Cryder verified pt takes gabapentin 300 mg 2 caps twice a day. Advised refill done.

## 2013-12-14 NOTE — Telephone Encounter (Signed)
Okay to fill for a year

## 2013-12-20 ENCOUNTER — Other Ambulatory Visit: Payer: Self-pay | Admitting: *Deleted

## 2013-12-20 NOTE — Telephone Encounter (Signed)
Percocet? Soma?

## 2013-12-20 NOTE — Telephone Encounter (Signed)
Last seen for leg pain on 11/08/13, med last filled on same date.

## 2013-12-20 NOTE — Telephone Encounter (Signed)
Walter Harris what does he need refilled?

## 2013-12-22 ENCOUNTER — Encounter: Payer: Self-pay | Admitting: Internal Medicine

## 2013-12-23 ENCOUNTER — Other Ambulatory Visit: Payer: Self-pay | Admitting: *Deleted

## 2013-12-23 MED ORDER — CARISOPRODOL 350 MG PO TABS: 350.0000 mg | ORAL_TABLET | Freq: Three times a day (TID) | ORAL | Status: DC | PRN

## 2013-12-23 NOTE — Telephone Encounter (Signed)
Pt called and is needing the SOMA 350 mg refilled.  He states that sometimes he needs up to 3 a day and leg cramping was very bad last night, a "10" on a pain level.   He is completely out and would like to get more than 30 quantity + refills if possible.  Please call in to [facility] [phone].  Pt would like a CB when completed.

## 2013-12-23 NOTE — Telephone Encounter (Signed)
rx called into pharmacy Spoke with patient and advised results   

## 2013-12-23 NOTE — Telephone Encounter (Signed)
Last filled 11/08/13 LETVAK PATIENT, Please send back to me for call in

## 2013-12-23 NOTE — Telephone Encounter (Signed)
With dr. Silvio Pate out of town, i will just fill 30 and defer to him if he will increase in future.  #30, 0 ref

## 2013-12-24 ENCOUNTER — Ambulatory Visit: Payer: Medicare Other | Admitting: Family Medicine

## 2014-01-07 ENCOUNTER — Telehealth: Payer: Self-pay | Admitting: Family Medicine

## 2014-01-07 ENCOUNTER — Other Ambulatory Visit: Payer: Self-pay | Admitting: *Deleted

## 2014-01-07 MED ORDER — PROPRANOLOL HCL 10 MG PO TABS: 10.0000 mg | ORAL_TABLET | Freq: Two times a day (BID) | ORAL | Status: DC

## 2014-01-07 NOTE — Telephone Encounter (Signed)
Dee and Dr. Silvio Pate are out of the office, can you help with this?

## 2014-01-07 NOTE — Telephone Encounter (Signed)
Dee- please make sure Dr. Silvio Pate sees this today before he leaves.  Pt called to say that he has been taking propranolol TID but RX sent in today was for BID.  He was very upset and said he has 3 tablets left and cannot go without this medication.  Clair Gulling from [facility] also called to verify dosage.  Please advise.  [place] cb [phone].

## 2014-01-07 NOTE — Telephone Encounter (Signed)
Spoke to [place] at [facility].  He will fill RX for up to TID, 90 day supply.

## 2014-01-07 NOTE — Telephone Encounter (Signed)
Previous rx in system says take BID-TID. Ok to call in propanolol same dose with those instructions, # 90 no refills

## 2014-01-08 NOTE — Telephone Encounter (Signed)
thanks

## 2014-01-20 ENCOUNTER — Encounter: Payer: Self-pay | Admitting: Internal Medicine

## 2014-01-27 ENCOUNTER — Other Ambulatory Visit: Payer: Self-pay | Admitting: *Deleted

## 2014-01-27 MED ORDER — CARISOPRODOL 350 MG PO TABS: 350.0000 mg | ORAL_TABLET | Freq: Three times a day (TID) | ORAL | Status: DC | PRN

## 2014-01-27 NOTE — Telephone Encounter (Signed)
#  30 x 0 was last filled on 12/23/13.

## 2014-01-27 NOTE — Telephone Encounter (Signed)
Okay #30 x 0 

## 2014-01-27 NOTE — Telephone Encounter (Signed)
rx called into pharmacy

## 2014-01-29 ENCOUNTER — Ambulatory Visit: Payer: Self-pay | Admitting: Neurosurgery

## 2014-02-11 ENCOUNTER — Other Ambulatory Visit: Payer: Self-pay | Admitting: Neurosurgery

## 2014-02-14 ENCOUNTER — Ambulatory Visit (INDEPENDENT_AMBULATORY_CARE_PROVIDER_SITE_OTHER): Payer: Medicare Other | Admitting: Internal Medicine

## 2014-02-14 ENCOUNTER — Encounter: Payer: Self-pay | Admitting: Internal Medicine

## 2014-02-14 ENCOUNTER — Encounter: Payer: Self-pay | Admitting: *Deleted

## 2014-02-14 VITALS — BP 120/80 | HR 96 | Temp 98.5°F | Wt 197.0 lb

## 2014-02-14 DIAGNOSIS — E1149 Type 2 diabetes mellitus with other diabetic neurological complication: Secondary | ICD-10-CM

## 2014-02-14 DIAGNOSIS — I10 Essential (primary) hypertension: Secondary | ICD-10-CM

## 2014-02-14 DIAGNOSIS — E1142 Type 2 diabetes mellitus with diabetic polyneuropathy: Secondary | ICD-10-CM | POA: Insufficient documentation

## 2014-02-14 DIAGNOSIS — F319 Bipolar disorder, unspecified: Secondary | ICD-10-CM

## 2014-02-14 DIAGNOSIS — IMO0002 Reserved for concepts with insufficient information to code with codable children: Secondary | ICD-10-CM

## 2014-02-14 LAB — HM DIABETES FOOT EXAM

## 2014-02-14 NOTE — Assessment & Plan Note (Signed)
No mania Mild reactive depression with his pain Uses the xanax occasionally

## 2014-02-14 NOTE — Assessment & Plan Note (Signed)
Having spine surgery next week Severe pain, bowel and bladder changes---seems to really need the procedure

## 2014-02-14 NOTE — Progress Notes (Signed)
Pre visit review using our clinic review tool, if applicable. No additional management support is needed unless otherwise documented below in the visit note. 

## 2014-02-14 NOTE — Assessment & Plan Note (Signed)
BP Readings from Last 3 Encounters:  02/14/14 120/80  11/08/13 150/80  09/17/13 148/80   Better now Tolerating the losartan

## 2014-02-14 NOTE — Assessment & Plan Note (Signed)
Burning and some pain in feet Hard to tell from the radiculopathy Think he will need the gabapentin even after his surgery

## 2014-02-14 NOTE — Progress Notes (Signed)
Subjective:    Patient ID: Walter Harris, male    DOB: Oct 24, 1945, 68 y.o.   MRN: [id number]  HPI Here for follow up  Having worsening radicular symptoms Weakness in legs Due for spine surgery next week by Dr Saintclair Halsted Oral steroids didn't help Tries to limit the pain pills--- very few lately  Ongoing stress Wife needs surgery soon too This affects his mood  He and wife are alone--- not close with any family Not depressed. No apparent mania--- he used to want to buy things (no recent urges)  Checking sugars twice a day AM usually under 100 No hypoglycemic reactions No foot sores  Checks BP daily 120's/70 or so No headaches No chest pain or SOB  Current Outpatient Prescriptions on File Prior to Visit  Medication Sig Dispense Refill  . ALPRAZolam (XANAX) 0.5 MG tablet Take 1 tablet (0.5 mg total) by mouth 2 (two) times daily.  60 tablet  0  . aspirin 81 MG tablet Take one by mouth once a day       . carisoprodol (SOMA) 350 MG tablet Take 1 tablet (350 mg total) by mouth 3 (three) times daily as needed for muscle spasms.  30 tablet  0  . gabapentin (NEURONTIN) 300 MG capsule Take 2 capsules (600 mg total) by mouth 2 (two) times daily.  360 capsule  3  . glucose blood test strip Use as instructed to test blood sugar 4 times daily, dx: 250.02      . insulin glargine (LANTUS) 100 UNIT/ML injection Inject 34 Units into the skin daily.       . INVOKANA 300 MG TABS Take 150 mg by mouth daily.       Marland Kitchen losartan (COZAAR) 50 MG tablet Take 1 tablet (50 mg total) by mouth daily.  90 tablet  3  . metFORMIN (GLUCOPHAGE) 500 MG tablet Take 1,000 mg by mouth 2 (two) times daily with a meal.       . oxyCODONE-acetaminophen (PERCOCET/ROXICET) 5-325 MG per tablet Take one or 2 tablets every 4-6 hours as needed for postsurgical pain  30 tablet  0  . pioglitazone (ACTOS) 30 MG tablet Take 30 mg by mouth daily.       . propranolol (INDERAL) 10 MG tablet Take 1 tablet (10 mg total) by mouth 2 (two)  times daily.  180 tablet  0   No current facility-administered medications on file prior to visit.    Allergies  Allergen Reactions  . Pravastatin     Severe myalgias  . Aripiprazole     Patient can't remember why he can't take it.  . Olanzapine     Can't remember   . Primidone     Can't remember   . Bactrim [Sulfamethoxazole-Trimethoprim]     mild  . Codeine Phosphate Rash  . Divalproex Sodium Other (See Comments)     tremor  . Lithium Carbonate Other (See Comments)    Tremors     Past Medical History  Diagnosis Date  . Hypertension   . Diabetes mellitus   . Hyperlipidemia   . History of colonic polyps   . Bipolar 1 disorder   . OCD (obsessive compulsive disorder)   . Stuttering   . Tremor, essential   . 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 DJD (degenerative joint disease)     lumbar spine  . Basal cell carcinoma     multiple  . Wears glasses   .  Hearing impaired     hearing aids-transmitter rt   . 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 Cervical fusion  1990  . Cholesteatoma excision  1993  . Other surgical history      benign head tumor#5  . Other surgical history      sebaceous cysts-post neck x5  . Appendectomy  1960  . External ear surgery      multiple ear surguries as a child  . Tonsillectomy    . Collateral ligament repair, elbow      bilateral, nerve improvement left 08/04, right 09/04  . Shoulder arthroscopy w/ acromial repair  08/2004    left shoulder  . Cervical fusion  8/12    multiple levels with plates  . Ulnar nerve transposition Right 05/13/2013    Procedure: RIGHT ULNAR NERVE DECOMPRESSION/TRANSPOSITION;  Surgeon: [name]., MD;  Location: [facility];  Service: Orthopedics;  Laterality: Right;    Family History  Problem Relation Age of Onset  . OCD Mother   . Bipolar disorder Mother   . Cancer Mother   . OCD Sister     . OCD Sister   . Emphysema Mother     never smoker, 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    History   Social History  . Marital Status: Married    Spouse Name: N/A    Number of Children: 1  . Years of Education: N/A   Occupational History  . disabled    Social History Main Topics  . Smoking status: Former Smoker -- 0.50 packs/day for 40 years    Types: Cigarettes    Quit date: 09/10/2003  . Smokeless tobacco: Never Used  . Alcohol Use: No  . Drug Use: No  . Sexual Activity: Not on file   Other Topics Concern  . Not on file   Social History Narrative  . No narrative on file   Review of Systems Sleeping well Appetite isn't that good Weight is down a few pounds Having trouble with bowels and voiding--may be from the lumbar spine issues    Objective:   Physical Exam  Constitutional: He appears well-developed and well-nourished. No distress.  Neck: Normal range of motion. Neck supple. No thyromegaly present.  Cardiovascular: Normal rate, regular rhythm, normal heart sounds and intact distal pulses.  Exam reveals no gallop.   No murmur heard. Pulmonary/Chest: Effort normal and breath sounds normal. No respiratory distress. He has no wheezes. He has no rales.  Musculoskeletal: He exhibits no edema and no tenderness.  Lymphadenopathy:    He has no cervical adenopathy.  Skin:  No foot ulcers Mild plantar callous  Psychiatric: He has a normal mood and affect. His behavior is normal.          Assessment & Plan:

## 2014-02-14 NOTE — Assessment & Plan Note (Signed)
Hopefully still acceptable control Very low in AM---if hypoglycemia, will cut back on lantus

## 2014-02-15 ENCOUNTER — Telehealth: Payer: Self-pay | Admitting: Internal Medicine

## 2014-02-15 LAB — HEMOGLOBIN A1C: HEMOGLOBIN A1C: 7.6 % — AB (ref 4.6–6.5)

## 2014-02-15 NOTE — Telephone Encounter (Signed)
Relevant patient education assigned to patient using Emmi. ° °

## 2014-02-16 ENCOUNTER — Encounter (HOSPITAL_COMMUNITY): Payer: Self-pay | Admitting: Pharmacy Technician

## 2014-02-16 ENCOUNTER — Encounter (HOSPITAL_COMMUNITY)
Admission: RE | Admit: 2014-02-16 | Discharge: 2014-02-16 | Disposition: A | Discharge status: Home / Self Care | Payer: Medicare Other | Source: Ambulatory Visit | Attending: Neurosurgery | Admitting: Neurosurgery

## 2014-02-16 ENCOUNTER — Encounter (HOSPITAL_COMMUNITY): Payer: Self-pay

## 2014-02-16 DIAGNOSIS — Z01812 Encounter for preprocedural laboratory examination: Secondary | ICD-10-CM | POA: Insufficient documentation

## 2014-02-16 HISTORY — DX: Personal history of other medical treatment: Z92.89

## 2014-02-16 HISTORY — DX: Calculus of kidney: N20.0

## 2014-02-16 HISTORY — DX: Unspecified cataract: H26.9

## 2014-02-16 HISTORY — DX: Cerebral infarction, unspecified: I63.9

## 2014-02-16 LAB — SURGICAL PCR SCREEN
MRSA, PCR: NEGATIVE
STAPHYLOCOCCUS AUREUS: NEGATIVE

## 2014-02-16 LAB — CBC
HEMATOCRIT: 46.8 % (ref 39.0–52.0)
HEMOGLOBIN: 16.1 g/dL (ref 13.0–17.0)
MCH: 31.9 pg (ref 26.0–34.0)
MCHC: 34.4 g/dL (ref 30.0–36.0)
MCV: 92.9 fL (ref 78.0–100.0)
Platelets: 191 10*3/uL (ref 150–400)
RBC: 5.04 MIL/uL (ref 4.22–5.81)
RDW: 14.1 % (ref 11.5–15.5)
WBC: 7.4 10*3/uL (ref 4.0–10.5)

## 2014-02-16 LAB — BASIC METABOLIC PANEL
BUN: 15 mg/dL (ref 6–23)
CO2: 24 meq/L (ref 19–32)
Calcium: 9.6 mg/dL (ref 8.4–10.5)
Chloride: 99 mEq/L (ref 96–112)
Creatinine, Ser: 0.92 mg/dL (ref 0.50–1.35)
GFR calc Af Amer: >90 mL/min (ref >90)
GFR, EST NON AFRICAN AMERICAN: 85 mL/min — AB (ref >90)
Glucose, Bld: 150 mg/dL — ABNORMAL HIGH (ref 70–99)
POTASSIUM: 4.4 meq/L (ref 3.7–5.3)
SODIUM: 139 meq/L (ref 137–147)

## 2014-02-16 NOTE — Pre-Procedure Instructions (Signed)
Walter Harris  02/16/2014   Your procedure is scheduled on:  Wednesday, February 23, 2014 at 11:45 AM.   Report to [facility] Entrance "A" Admitting Office at 9:45 AM.   Call this number if you have problems the morning of surgery: [phone]   Remember:   Do not eat food or drink liquids after midnight Tuesday, 02/22/14.    Take these medicines the morning of surgery with A SIP OF WATER: ALPRAZolam Duanne Moron),  gabapentin (NEURONTIN), propranolol (INDERAL), oxyCODONE-acetaminophen (PERCOCET/ROXICET) - if needed, carisoprodol (SOMA) - if needed.  Stop Aspirin as of tomorrow.  Do not take any of your diabetic medications the morning of surgery.  Continue to take all your other medicines as you normally do until day of surgery and then follow above instructions.    Do not wear jewelry.  Do not wear lotions, powders, or cologne. You may wear deodorant.  Men may shave face and neck.  Do not bring valuables to the hospital.  [facility] is not responsible                  for any belongings or valuables.               Contacts, dentures or bridgework may not be worn into surgery.  Leave suitcase in the car. After surgery it may be brought to your room.  For patients admitted to the hospital, discharge time is determined by your                treatment team.                              Special Instructions: [place] - Preparing for Surgery  Before surgery, you can play an important role.  Because skin is not sterile, your skin needs to be as free of germs as possible.  You can reduce the number of germs on you skin by washing with CHG (chlorahexidine gluconate) soap before surgery.  CHG is an antiseptic cleaner which kills germs and bonds with the skin to continue killing germs even after washing.  Please DO NOT use if you have an allergy to CHG or antibacterial soaps.  If your skin becomes reddened/irritated stop using the CHG and inform your nurse when you arrive at Short  Stay.  Do not shave (including legs and underarms) for at least 48 hours prior to the first CHG shower.  You may shave your face.  Please follow these instructions carefully:   1.  Shower with CHG Soap the night before surgery and the                                morning of Surgery.  2.  If you choose to wash your hair, wash your hair first as usual with your       normal shampoo.  3.  After you shampoo, rinse your hair and body thoroughly to remove the                      Shampoo.  4.  Use CHG as you would any other liquid soap.  You can apply chg directly       to the skin and wash gently with scrungie or a clean washcloth.  5.  Apply the CHG Soap to your body ONLY FROM THE NECK DOWN.  Do not use on open wounds or open sores.  Avoid contact with your eyes, ears, mouth and genitals (private parts).  Wash genitals (private parts) with your normal soap.  6.  Wash thoroughly, paying special attention to the area where your surgery        will be performed.  7.  Thoroughly rinse your body with warm water from the neck down.  8.  DO NOT shower/wash with your normal soap after using and rinsing off       the CHG Soap.  9.  Pat yourself dry with a clean towel.            10.  Wear clean pajamas.            11.  Place clean sheets on your bed the night of your first shower and do not        sleep with pets.  Day of Surgery  Do not apply any lotions the morning of surgery.  Please wear clean clothes to the hospital/surgery center.     Please read over the following fact sheets that you were given: Pain Booklet, Coughing and Deep Breathing, MRSA Information and Surgical Site Infection Prevention

## 2014-02-16 NOTE — Progress Notes (Signed)
02/16/14 1429  OBSTRUCTIVE SLEEP APNEA  Have you ever been diagnosed with sleep apnea through a sleep study? No  Do you snore loudly (loud enough to be heard through closed doors)?  0  Do you often feel tired, fatigued, or sleepy during the daytime? 1  Has anyone observed you stop breathing during your sleep? 0  Do you have, or are you being treated for high blood pressure? 1  BMI more than 35 kg/m2? 0  Age over 67 years old? 1  Neck circumference greater than 40 cm/16 inches? 1 (16)  Gender: 1  Obstructive Sleep Apnea Score 5  Score 4 or greater  Results sent to PCP

## 2014-02-16 NOTE — Progress Notes (Signed)
Pt and wife both very concerned about pt having Post Anesthesia Cognitive Dysfunction. States the last time he had neck surgery, he was sent home the same day and did not wake up for a week. I told them that they would see the anesthesiologist the day of surgery and that the plan was for the pt to be admitted after surgery.

## 2014-02-21 ENCOUNTER — Other Ambulatory Visit (HOSPITAL_COMMUNITY): Payer: Medicare Other

## 2014-02-23 ENCOUNTER — Inpatient Hospital Stay (HOSPITAL_COMMUNITY): Admission: RE | Admit: 2014-02-23 | Payer: Medicare Other | Source: Ambulatory Visit | Admitting: Neurosurgery

## 2014-02-23 ENCOUNTER — Encounter (HOSPITAL_COMMUNITY): Admission: RE | Payer: Self-pay | Source: Ambulatory Visit

## 2014-02-23 SURGERY — LUMBAR LAMINECTOMY/DECOMPRESSION MICRODISCECTOMY 1 LEVEL
Anesthesia: General | Site: Back | Laterality: Right

## 2014-03-03 ENCOUNTER — Telehealth: Payer: Self-pay | Admitting: *Deleted

## 2014-03-03 NOTE — Telephone Encounter (Signed)
Please let him know that I don't think he will qualify for insurance to pay for a scooter for him. I can give him a prescription so he doesn't have to pay tax, but otherwise, unless he can't get around in his house (like can't walk from his bedroom to the bathroom) insurance won't pay They will only pay for household need---not if he needs it while in the store or anywhere out of the house. Can probably cancel the visit

## 2014-03-03 NOTE — Telephone Encounter (Signed)
Wants to [facility] to [facility] for mobility scooter, I advised pt he would need a visit to discuss with Dr. Silvio Pate, pt was upset that he had to come in and he's upset that nobody accepts insurance for these scooters. Pt states he was told to call his surgeon and they also wanted pt to come for a visit but per pt that would cost him $45 and only $5 to come here. appt scheduled for Monday 03/07/14 to discuss.

## 2014-03-03 NOTE — Telephone Encounter (Signed)
Spoke with patient and he states his insurance told him he would only be required to pay 20% pt states he can't go shopping or get out in the yard and wants the scooter to be able to walk his dog and work in the yard, pt states his legs are no good. I canceled the appt.

## 2014-03-07 ENCOUNTER — Ambulatory Visit: Payer: Medicare Other | Admitting: Internal Medicine

## 2014-03-14 ENCOUNTER — Encounter: Payer: Self-pay | Admitting: Internal Medicine

## 2014-03-24 ENCOUNTER — Other Ambulatory Visit: Payer: Self-pay | Admitting: *Deleted

## 2014-03-24 NOTE — Telephone Encounter (Signed)
Okay #60 x 0 

## 2014-03-24 NOTE — Telephone Encounter (Signed)
Fax refill request, please advise  

## 2014-03-25 MED ORDER — ALPRAZOLAM 0.5 MG PO TABS: 0.5000 mg | ORAL_TABLET | Freq: Two times a day (BID) | ORAL | Status: DC | PRN

## 2014-03-25 NOTE — Telephone Encounter (Signed)
Rx called in to pharmacy. 

## 2014-04-04 ENCOUNTER — Telehealth: Payer: Self-pay

## 2014-04-04 NOTE — Telephone Encounter (Signed)
I really don't think that any med is necessarily going to be safer than invokana--especially an injectable one. If he hasn't had problems with it, I think he should stick with it

## 2014-04-04 NOTE — Telephone Encounter (Signed)
Pt left v/m; pt has been taking Invokana and pt has been reading about FDA warnings and the possible side effects and problems that it can cause.  Pt wants to stop Invokana and wants to know what Dr Silvio Pate can substitute for Invokana to help lower A1C.pt said understands there are injectible meds now to lower A1C. Pt request cb. [facility].

## 2014-04-04 NOTE — Telephone Encounter (Signed)
Spoke with patient and advised results, he will keep taking it

## 2014-04-07 ENCOUNTER — Other Ambulatory Visit: Payer: Self-pay | Admitting: *Deleted

## 2014-04-07 MED ORDER — PROPRANOLOL HCL 10 MG PO TABS: 10.0000 mg | ORAL_TABLET | Freq: Two times a day (BID) | ORAL | Status: DC

## 2014-04-13 ENCOUNTER — Telehealth: Payer: Self-pay

## 2014-04-13 MED ORDER — EPINEPHRINE 0.3 MG/0.3ML IJ SOAJ: 0.3000 mg | Freq: Once | INTRAMUSCULAR | Status: DC

## 2014-04-13 NOTE — Telephone Encounter (Signed)
Okay to fill #2 x 1 refill

## 2014-04-13 NOTE — Telephone Encounter (Signed)
Pt left v/m requesting order for new epipen; Epipen pt has is expired. Pt uses if needed due to bee stings. [facility]. Pt request cb.

## 2014-04-13 NOTE — Telephone Encounter (Signed)
rx sent to pharmacy by e-script Spoke with patient and advised results   

## 2014-04-14 ENCOUNTER — Ambulatory Visit: Payer: Medicare Other | Admitting: Endocrinology

## 2014-04-21 LAB — LIPID PANEL
Cholesterol: 204 mg/dL — AB (ref 0–200)
LDL CALC: 126 mg/dL

## 2014-04-21 LAB — HEMOGLOBIN A1C: Hgb A1c MFr Bld: 7.3 % — AB (ref 4.0–6.0)

## 2014-04-25 ENCOUNTER — Encounter: Payer: Self-pay | Admitting: Internal Medicine

## 2014-04-25 ENCOUNTER — Ambulatory Visit: Payer: Medicare Other | Admitting: Pulmonary Disease

## 2014-05-02 ENCOUNTER — Ambulatory Visit: Payer: Self-pay | Admitting: Family Medicine

## 2014-05-25 ENCOUNTER — Other Ambulatory Visit: Payer: Self-pay | Admitting: *Deleted

## 2014-05-25 MED ORDER — CARISOPRODOL 350 MG PO TABS: 350.0000 mg | ORAL_TABLET | Freq: Three times a day (TID) | ORAL | Status: DC | PRN

## 2014-05-25 NOTE — Telephone Encounter (Signed)
rx called into pharmacy

## 2014-05-25 NOTE — Telephone Encounter (Signed)
Okay #30 x 0 

## 2014-05-25 NOTE — Telephone Encounter (Signed)
01/27/14 

## 2014-08-29 ENCOUNTER — Telehealth: Payer: Self-pay | Admitting: Internal Medicine

## 2014-08-29 NOTE — Telephone Encounter (Signed)
Okay with me if okay with [place], please schedule next OV with me, 30 min. Thanks.

## 2014-08-29 NOTE — Telephone Encounter (Signed)
I notified patient that he can switch to [facility].  Patient said he'll call back after the holidays and schedule a 30 minute appointment with Dr.Duncan.

## 2014-08-29 NOTE — Telephone Encounter (Signed)
Okay with me---he can be very labile but a very nice person

## 2014-08-29 NOTE — Telephone Encounter (Signed)
Pt is requesting to change PCP from [place] to [place] [facility].

## 2014-08-31 ENCOUNTER — Ambulatory Visit: Payer: Self-pay | Admitting: Neurosurgery

## 2014-09-22 ENCOUNTER — Other Ambulatory Visit: Payer: Self-pay | Admitting: Neurosurgery

## 2014-09-27 ENCOUNTER — Encounter (HOSPITAL_COMMUNITY): Payer: Self-pay

## 2014-09-27 ENCOUNTER — Encounter (HOSPITAL_COMMUNITY)
Admission: RE | Admit: 2014-09-27 | Discharge: 2014-09-27 | Disposition: A | Discharge status: Home / Self Care | Payer: PPO | Source: Ambulatory Visit | Attending: Neurosurgery | Admitting: Neurosurgery

## 2014-09-27 ENCOUNTER — Encounter (HOSPITAL_COMMUNITY): Payer: Self-pay | Admitting: Vascular Surgery

## 2014-09-27 DIAGNOSIS — M4310 Spondylolisthesis, site unspecified: Secondary | ICD-10-CM | POA: Insufficient documentation

## 2014-09-27 HISTORY — DX: Fracture of nasal bones, initial encounter for closed fracture: S02.2XXA

## 2014-09-27 LAB — BASIC METABOLIC PANEL WITH GFR
Anion gap: 5 (ref 5–15)
BUN: 12 mg/dL (ref 6–23)
CO2: 29 mmol/L (ref 19–32)
Calcium: 9.1 mg/dL (ref 8.4–10.5)
Chloride: 105 meq/L (ref 96–112)
Creatinine, Ser: 1.07 mg/dL (ref 0.50–1.35)
GFR calc Af Amer: 81 mL/min — ABNORMAL LOW (ref >90)
GFR calc non Af Amer: 70 mL/min — ABNORMAL LOW (ref >90)
Glucose, Bld: 166 mg/dL — ABNORMAL HIGH (ref 70–99)
Potassium: 3.9 mmol/L (ref 3.5–5.1)
Sodium: 139 mmol/L (ref 135–145)

## 2014-09-27 LAB — SURGICAL PCR SCREEN
MRSA, PCR: NEGATIVE
STAPHYLOCOCCUS AUREUS: NEGATIVE

## 2014-09-27 LAB — CBC
HCT: 49.4 % (ref 39.0–52.0)
Hemoglobin: 16.7 g/dL (ref 13.0–17.0)
MCH: 31.5 pg (ref 26.0–34.0)
MCHC: 33.8 g/dL (ref 30.0–36.0)
MCV: 93 fL (ref 78.0–100.0)
Platelets: 182 K/uL (ref 150–400)
RBC: 5.31 MIL/uL (ref 4.22–5.81)
RDW: 13.4 % (ref 11.5–15.5)
WBC: 8.6 K/uL (ref 4.0–10.5)

## 2014-09-27 LAB — TYPE AND SCREEN
ABO/RH(D): A POS
Antibody Screen: NEGATIVE

## 2014-09-27 LAB — ABO/RH: ABO/RH(D): A POS

## 2014-09-27 NOTE — Progress Notes (Signed)
Fasting blood sugars are 98-112.  PCP:Dr. Elisabeth Pigeon in [place]  Pt. Denies htn. Doesn't see a ccariologist

## 2014-09-27 NOTE — Anesthesia Preprocedure Evaluation (Deleted)
Anesthesia Evaluation    Airway        Dental   Pulmonary former smoker,          Cardiovascular hypertension,     Neuro/Psych    GI/Hepatic   Endo/Other  diabetes  Renal/GU      Musculoskeletal   Abdominal   Peds  Hematology   Anesthesia Other Findings   Reproductive/Obstetrics                             Anesthesia Physical Anesthesia Plan  ASA:   Anesthesia Plan:    Post-op Pain Management:    Induction:   Airway Management Planned:   Additional Equipment:   Intra-op Plan:   Post-operative Plan:   Informed Consent:   Plan Discussed with:   Anesthesia Plan Comments: (See my anesthesia note.  Patient is concerned due to his history of post-operative cognitive dysfunction after his 04/2011 ACDF.  His past three anesthesia records are printed for review by his anesthesiologist. Myra Gianotti, PA-C)        Anesthesia Quick Evaluation

## 2014-09-27 NOTE — Pre-Procedure Instructions (Signed)
Walter Harris  09/27/2014   Your procedure is scheduled on:  Wednesday, January 20th  Report to [facility] Admitting at 11 AM.  Call this number if you have problems the morning of surgery: [phone]   Remember:   Do not eat food or drink liquids after midnight.   Take these medicines the morning of surgery with A SIP OF WATER: propranolol, percocet if needed, xanax if needed   Do not wear jewelry.  Do not wear lotions, powders, or perfumes,deodorant.  Do not shave 48 hours prior to surgery. Men may shave face and neck.  Do not bring valuables to the hospital.  [facility] is not responsible for any belongings or valuables.               Contacts, dentures or bridgework may not be worn into surgery.  Leave suitcase in the car. After surgery it may be brought to your room.  For patients admitted to the hospital, discharge time is determined by your treatment team.          Please read over the following fact sheets that you were given: Pain Booklet, Coughing and Deep Breathing, Blood Transfusion Information, MRSA Information and Surgical Site Infection Prevention  [place] - Preparing for Surgery  Before surgery, you can play an important role.  Because skin is not sterile, your skin needs to be as free of germs as possible.  You can reduce the number of germs on you skin by washing with CHG (chlorahexidine gluconate) soap before surgery.  CHG is an antiseptic cleaner which kills germs and bonds with the skin to continue killing germs even after washing.  Please DO NOT use if you have an allergy to CHG or antibacterial soaps.  If your skin becomes reddened/irritated stop using the CHG and inform your nurse when you arrive at Short Stay.  Do not shave (including legs and underarms) for at least 48 hours prior to the first CHG shower.  You may shave your face.  Please follow these instructions carefully:   1.  Shower with CHG Soap the night before surgery and the  morning of Surgery.  2.  If you choose to wash your hair, wash your hair first as usual with your normal shampoo.  3.  After you shampoo, rinse your hair and body thoroughly to remove the shampoo.  4.  Use CHG as you would any other liquid soap.  You can apply CHG directly to the skin and wash gently with scrungie or a clean washcloth.  5.  Apply the CHG Soap to your body ONLY FROM THE NECK DOWN.  Do not use on open wounds or open sores.  Avoid contact with your eyes, ears, mouth and genitals (private parts).  Wash genitals (private parts) with your normal soap.  6.  Wash thoroughly, paying special attention to the area where your surgery will be performed.  7.  Thoroughly rinse your body with warm water from the neck down.  8.  DO NOT shower/wash with your normal soap after using and rinsing off the CHG Soap.  9.  Pat yourself dry with a clean towel.            10.  Wear clean pajamas.            11.  Place clean sheets on your bed the night of your first shower and do not sleep with pets.  Day of Surgery  Do not apply any lotions/deoderants the  morning of surgery.  Please wear clean clothes to the hospital/surgery center.

## 2014-09-27 NOTE — Progress Notes (Signed)
Anesthesia PAT Evaluation:  Patient is a 69 year old male scheduled for one level MAS tomorrow by Dr. Saintclair Halsted. Room is booked for approximately 4 hours.   History includes former smoker, DM2, HLD, Bipolar 1 disorder, OCD, TIA ~ 25 years ago with resulting tremors and stuttering (both which have improved somewhat), hearing impaired, skin cancer (BCC), arthritis, CKD, nasal fracture X 3, L5-S1 microdiskectomy 11/02/10, C4-5 and C6-7 ACDF 05/01/11, right ulnar nerve neurolysis (GA with LMA), cholesteatoma excision. HTN is listed, but patient denied (he is on b-blocker therapy for tremors). CKD is also listed, but he denied any renal issues.  Denied ETOH or liver disease. PCP is listed as Dr. Debbora Dus in [place].   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  He has had multiple surgeries and this was the only surgery that he had POCD. He was discharged the same day of surgery, and reportedly it took four people to get him into the car.  He was in a "zombie" state for five days. His cognitive function improved fairly abruptly on the fifth day post-op. The experience was traumatic for him and his wife, so he wants his anesthesia team to do everything in their power to help avoid.   Meds include Xanax, ASA (on hold), Invokana, Soma, gabapentin, Cozaar, metformin, Percocet, Actos, propranolol, trazadone, glargine (Toujeo). He reports fasting glucose typically ~ 98 - 112.   EKG 09/27/14: NSR, cannot rule out anterior infarct (age undetermined).  Since last tracing there is slight loss of R wave voltage in V3.  Patient denied chest pain, SOB, edema.  His activity is limited by his back pain. He reported a prior stress test > 5 years ago that was normal.   Preoperative labs noted.    On exam, he is a pleasant white male in NAD.  He is using a hospital wheelchair.  Heart RRR, no murmur noted. Lungs clear.  We discussed risk factors associated POCD.  Some of his risk factors  include age > 36, surgery of ~ 4 hours, history of TIA.  Patient had read some about this on the internet as well.  He was told that although we cannot completely eliminate the risk, anesthesia would do what was in their ability to help reduce the risk.  I printed off his last three anesthesia records including the two records from 2012 and his last anesthesia record from 2014.  These will be on his chart for review by his assigned anesthesiologist on the day of surgery.  I updated anesthesiologist Dr. Marcie Bal as well.  George Hugh [facility] Short Stay Center/Anesthesiology Phone [phone] 09/27/2014 3:06 PM

## 2014-09-27 NOTE — Progress Notes (Signed)
Left message for Lorriane Shire at Dr. Windy Carina office that he needs to sign orders.

## 2014-10-20 ENCOUNTER — Ambulatory Visit: Payer: PPO | Admitting: Family Medicine

## 2014-10-24 ENCOUNTER — Inpatient Hospital Stay (HOSPITAL_COMMUNITY): Admission: RE | Admit: 2014-10-24 | Payer: PPO | Source: Ambulatory Visit | Admitting: Neurosurgery

## 2014-10-24 ENCOUNTER — Encounter (HOSPITAL_COMMUNITY): Admission: RE | Payer: Self-pay | Source: Ambulatory Visit

## 2014-10-24 SURGERY — FOR MAXIMUM ACCESS (MAS) POSTERIOR LUMBAR INTERBODY FUSION (PLIF) 1 LEVEL
Anesthesia: General | Site: Back

## 2014-10-25 ENCOUNTER — Ambulatory Visit (INDEPENDENT_AMBULATORY_CARE_PROVIDER_SITE_OTHER): Payer: PPO | Admitting: Family Medicine

## 2014-10-25 ENCOUNTER — Encounter: Payer: Self-pay | Admitting: Family Medicine

## 2014-10-25 VITALS — BP 150/90 | HR 89 | Temp 97.7°F | Wt 198.8 lb

## 2014-10-25 DIAGNOSIS — E1140 Type 2 diabetes mellitus with diabetic neuropathy, unspecified: Secondary | ICD-10-CM

## 2014-10-25 DIAGNOSIS — F411 Generalized anxiety disorder: Secondary | ICD-10-CM

## 2014-10-25 MED ORDER — GABAPENTIN 300 MG PO CAPS: 300.0000 mg | ORAL_CAPSULE | Freq: Two times a day (BID) | ORAL | Status: DC

## 2014-10-25 NOTE — Patient Instructions (Signed)
Take care.  Glad to see you.  Recheck in about 4-5 months at a physical/wellness visit.  We'll go from there.

## 2014-10-25 NOTE — Progress Notes (Signed)
Pre visit review using our clinic review tool, if applicable. No additional management support is needed unless otherwise documented below in the visit note.  Panic sx, esp "In a crowd" and "it's not a new thing".  Taking xanax qAM and occ later in the day. No ADE.  Doing well with med, good effect.   H/o DM2, followed by Dr. Elyse Hsu.  No ADE on current meds.  AM 96 this AM.  Has endo f/u next month.   back pain. Is trying to put off surgery.  R leg pain, prev injected, with some relief.  No weakness or new sx o/w.  D/w pt about his prev injection.    Meds, vitals, and allergies reviewed.   ROS: See HPI.  Otherwise, noncontributory.  nad ncat Mmm OP wnl  Neck supple, no LA rrr ctab abd soft ext w/o edema.

## 2014-10-27 ENCOUNTER — Encounter: Payer: Self-pay | Admitting: Family Medicine

## 2014-10-27 DIAGNOSIS — F411 Generalized anxiety disorder: Secondary | ICD-10-CM | POA: Insufficient documentation

## 2014-10-27 NOTE — Assessment & Plan Note (Signed)
Panic sx, esp "In a crowd" and "it's not a new thing".  Taking xanax qAM and occ later in the day. No ADE.  Doing well with med, good effect. Would continue as is.

## 2014-10-27 NOTE — Assessment & Plan Note (Signed)
Is trying to put off surgery.  R leg pain, prev injected, with some relief.  No weakness or new sx o/w.  D/w pt about his prev injection.

## 2014-10-27 NOTE — Assessment & Plan Note (Signed)
Followed by Dr. Elyse Hsu.  No ADE on current meds.  AM 96 this AM.  Has endo f/u next month.  Will await consult.

## 2014-11-02 ENCOUNTER — Telehealth: Payer: Self-pay

## 2014-11-02 NOTE — Telephone Encounter (Signed)
Pt was seen on 10/25/14 by Dr [name] to establish and pt wanted to know if he needs refills on med what to do; advised pt to ck with [facility] and if no refill is available; [facility] will contact office for refills; pt voiced understanding.

## 2014-11-25 ENCOUNTER — Ambulatory Visit (INDEPENDENT_AMBULATORY_CARE_PROVIDER_SITE_OTHER): Payer: PPO | Admitting: Family Medicine

## 2014-11-25 ENCOUNTER — Encounter: Payer: Self-pay | Admitting: Family Medicine

## 2014-11-25 VITALS — BP 142/84 | HR 84 | Temp 97.5°F | Wt 195.8 lb

## 2014-11-25 DIAGNOSIS — R432 Parageusia: Secondary | ICD-10-CM

## 2014-11-25 NOTE — Progress Notes (Signed)
Pre visit review using our clinic review tool, if applicable. No additional management support is needed unless otherwise documented below in the visit note.  He was injected and has needed much less oxycodone.  1 dose of oxycodone in the last week, sleeping better.  Much less pain overall.  Stiff in AM but that gets better as he moves around.   "Stomach troubles."  Loose stools recently.  Was going on for about 3 days, that was last week and back to normal now.  Food isn't appetizing now, that has been going on for a few weeks.  "The only thing with any flavor now is [place]".  No metallic taste in mouth.  No FCNAV.  No abd pain.  No blood in stool.  No black stools.  Still on regular meds.  Sugar was 111 this AM, at baseline recently.  Last A1c was 7.6.  Can still swallow like at baseline.  No thrush.    He may need to transfer his DM care back here, his endo MD is retiring.  He'll consider and notify me if I can be of service.   Meds, vitals, and allergies reviewed.   ROS: See HPI.  Otherwise, noncontributory.  GEN: nad, alert and oriented HEENT: mucous membranes moist, OP wnl NECK: supple w/o LA CV: rrr PULM: ctab, no inc wob ABD: soft, +bs EXT: no edema SKIN: no acute rash

## 2014-11-25 NOTE — Patient Instructions (Signed)
Don't change your meds for now.  If your taste doesn't get back to normal in about 1 more week, then let me know.  Take care.  Glad to see you.  Let me know when you need to transfer your diabetes care back here.

## 2014-11-28 DIAGNOSIS — R432 Parageusia: Secondary | ICD-10-CM | POA: Insufficient documentation

## 2014-11-28 NOTE — Assessment & Plan Note (Signed)
Benign exam, no clear cause noted.  No recent med changes.  This may have been related to prev mild GI sx, would give this a little longer to resolve.  If not better, then he'll notify me.  Still okay for outpatient f/u.  He agrees.

## 2014-12-15 ENCOUNTER — Other Ambulatory Visit: Payer: Self-pay | Admitting: Family Medicine

## 2014-12-15 NOTE — Telephone Encounter (Signed)
Last OV 11/25/14, RX last written 03/25/14 by Dr. Silvio Pate

## 2014-12-16 MED ORDER — ALPRAZOLAM 0.5 MG PO TABS: 0.5000 mg | ORAL_TABLET | Freq: Two times a day (BID) | ORAL | Status: DC | PRN

## 2014-12-16 NOTE — Telephone Encounter (Signed)
Please call in.  Thanks.   

## 2014-12-16 NOTE — Telephone Encounter (Signed)
Rx called in to pharmacy. 

## 2014-12-16 NOTE — Telephone Encounter (Signed)
Pt left v/m requesting status of xanax refill; advised pt ready for pick up at [facility].

## 2014-12-26 ENCOUNTER — Ambulatory Visit (INDEPENDENT_AMBULATORY_CARE_PROVIDER_SITE_OTHER): Payer: PPO | Admitting: Family Medicine

## 2014-12-26 ENCOUNTER — Encounter: Payer: Self-pay | Admitting: Family Medicine

## 2014-12-26 VITALS — BP 158/84 | HR 86 | Temp 97.7°F | Wt 199.0 lb

## 2014-12-26 DIAGNOSIS — E1140 Type 2 diabetes mellitus with diabetic neuropathy, unspecified: Secondary | ICD-10-CM | POA: Diagnosis not present

## 2014-12-26 MED ORDER — INSULIN GLARGINE 100 UNIT/ML SOLOSTAR PEN: 26.0000 [IU] | PEN_INJECTOR | Freq: Every day | SUBCUTANEOUS | Status: DC

## 2014-12-26 NOTE — Patient Instructions (Signed)
Use 26 units a day and update me as needed. Recheck A1c here before a visit in about 3 months.   Call me if your sugar is greatly elevated in the meantime.  Take care.  Glad to see you.

## 2014-12-26 NOTE — Progress Notes (Signed)
Pre visit review using our clinic review tool, if applicable. No additional management support is needed unless otherwise documented below in the visit note.  He held aspirin for his back injection, he eventually had some relief after the injection.  Less pain med use now.  Not needing oxycodone or trazodone recently since pain is much better.    His endo MD is retiring.  He had been on concentrated lantus but insurance wouldn't cover it.  We talked about options.  He had confusion about his dosing.  Has been on 26 units prev.  He thought his pen would only have 100 units total.  We clarified this.    Meds, vitals, and allergies reviewed.   ROS: See HPI.  Otherwise, noncontributory.  nad Exam deferred as >15 minutes spent in face to face time with patient, >50% spent in counselling or coordination of care

## 2014-12-26 NOTE — Assessment & Plan Note (Signed)
Will recheck a1c in about 3 months.  He'll continue with 26 units.  Med list updated.  He'll use lantus.  1 pen should last about 11 days, 1 box about 55 days, assuming no dose change.  If sugar is elevated in the meantime, then he'll update me.  He agrees.  >15 minutes spent in face to face time with patient, >50% spent in counselling or coordination of care.

## 2015-01-05 ENCOUNTER — Other Ambulatory Visit: Payer: Self-pay | Admitting: *Deleted

## 2015-01-05 MED ORDER — CANAGLIFLOZIN 300 MG PO TABS: 150.0000 mg | ORAL_TABLET | Freq: Every day | ORAL | Status: DC

## 2015-01-06 ENCOUNTER — Telehealth: Payer: Self-pay | Admitting: *Deleted

## 2015-01-06 MED ORDER — CANAGLIFLOZIN 300 MG PO TABS: 150.0000 mg | ORAL_TABLET | Freq: Every day | ORAL | Status: DC

## 2015-01-06 NOTE — Telephone Encounter (Signed)
I think this was prev through endo.  I thought patient was on 1/2 tab a day.  Please clarify with patient and let me know.   Thanks.

## 2015-01-06 NOTE — Telephone Encounter (Signed)
Received refill approval on Invokana 300 mg tablet.  Take 150 mg by mouth daily before breakfast.  Pharmacy is questioning if this sig is correct?

## 2015-01-06 NOTE — Telephone Encounter (Signed)
Patient states he does take 1/2 tablet per day so the amount was adjusted to #15 (30 day supply) OR #45 (90 day supply).  Patient says Dr. [name] agreed to take over these refills.

## 2015-01-09 ENCOUNTER — Telehealth: Payer: Self-pay | Admitting: Family Medicine

## 2015-01-09 MED ORDER — CANAGLIFLOZIN 300 MG PO TABS: 150.0000 mg | ORAL_TABLET | Freq: Every day | ORAL | Status: DC

## 2015-01-09 NOTE — Telephone Encounter (Signed)
Patient left a voicemail stating that he wants the number of his Invokana changed back to #30 each time he gets it refilled because this last him for 2 months and this is a very expensive medication and insurance will pay for #30 at a time. Patient stated that the last refill he got was only #15.

## 2015-01-09 NOTE — Telephone Encounter (Signed)
I was going to take it over, so that is fine. I just wanted clarity on the dosing.  Sent. Thanks.

## 2015-01-09 NOTE — Addendum Note (Signed)
Addended by: [name] on: 01/09/2015 09:36 AM   Modules accepted: Orders

## 2015-01-09 NOTE — Telephone Encounter (Signed)
Is it okay that I change the quantity to #30?  See below.

## 2015-01-11 NOTE — Telephone Encounter (Signed)
Please call patient about refill.  Please call patient at [phone].

## 2015-01-11 NOTE — Telephone Encounter (Signed)
Called patient back and advised him that the refill has been taken care of.

## 2015-02-26 ENCOUNTER — Other Ambulatory Visit: Payer: Self-pay | Admitting: Family Medicine

## 2015-02-26 DIAGNOSIS — E1140 Type 2 diabetes mellitus with diabetic neuropathy, unspecified: Secondary | ICD-10-CM

## 2015-02-28 ENCOUNTER — Other Ambulatory Visit (INDEPENDENT_AMBULATORY_CARE_PROVIDER_SITE_OTHER): Payer: PPO

## 2015-02-28 ENCOUNTER — Encounter: Payer: Self-pay | Admitting: Radiology

## 2015-02-28 DIAGNOSIS — E1140 Type 2 diabetes mellitus with diabetic neuropathy, unspecified: Secondary | ICD-10-CM | POA: Diagnosis not present

## 2015-02-28 LAB — COMPREHENSIVE METABOLIC PANEL
ALT: 13 U/L (ref 0–53)
AST: 16 U/L (ref 0–37)
Albumin: 4 g/dL (ref 3.5–5.2)
Alkaline Phosphatase: 68 U/L (ref 39–117)
BILIRUBIN TOTAL: 0.8 mg/dL (ref 0.2–1.2)
BUN: 13 mg/dL (ref 6–23)
CHLORIDE: 105 meq/L (ref 96–112)
CO2: 30 mEq/L (ref 19–32)
Calcium: 9.3 mg/dL (ref 8.4–10.5)
Creatinine, Ser: 0.9 mg/dL (ref 0.40–1.50)
GFR: 89.13 mL/min (ref >60.00)
Glucose, Bld: 156 mg/dL — ABNORMAL HIGH (ref 70–99)
Potassium: 4.6 mEq/L (ref 3.5–5.1)
Sodium: 140 mEq/L (ref 135–145)
Total Protein: 6.7 g/dL (ref 6.0–8.3)

## 2015-02-28 LAB — LIPID PANEL
CHOL/HDL RATIO: 4
Cholesterol: 159 mg/dL (ref 0–200)
HDL: 44.1 mg/dL (ref >39.00)
LDL Cholesterol: 94 mg/dL (ref 0–99)
NONHDL: 114.9
Triglycerides: 105 mg/dL (ref 0.0–149.0)
VLDL: 21 mg/dL (ref 0.0–40.0)

## 2015-02-28 LAB — MICROALBUMIN / CREATININE URINE RATIO
CREATININE, U: 139.5 mg/dL
Microalb Creat Ratio: 4 mg/g (ref 0.0–30.0)
Microalb, Ur: 5.6 mg/dL — ABNORMAL HIGH (ref 0.0–1.9)

## 2015-02-28 LAB — HEMOGLOBIN A1C: HEMOGLOBIN A1C: 7.4 % — AB (ref 4.6–6.5)

## 2015-03-07 ENCOUNTER — Encounter: Payer: Self-pay | Admitting: Family Medicine

## 2015-03-07 ENCOUNTER — Ambulatory Visit (INDEPENDENT_AMBULATORY_CARE_PROVIDER_SITE_OTHER): Payer: PPO | Admitting: Family Medicine

## 2015-03-07 VITALS — BP 136/82 | HR 67 | Temp 98.0°F | Ht 69.0 in | Wt 201.2 lb

## 2015-03-07 DIAGNOSIS — Z7189 Other specified counseling: Secondary | ICD-10-CM

## 2015-03-07 DIAGNOSIS — E1140 Type 2 diabetes mellitus with diabetic neuropathy, unspecified: Secondary | ICD-10-CM

## 2015-03-07 DIAGNOSIS — Z0000 Encounter for general adult medical examination without abnormal findings: Secondary | ICD-10-CM | POA: Diagnosis not present

## 2015-03-07 DIAGNOSIS — R251 Tremor, unspecified: Secondary | ICD-10-CM

## 2015-03-07 DIAGNOSIS — E785 Hyperlipidemia, unspecified: Secondary | ICD-10-CM | POA: Diagnosis not present

## 2015-03-07 MED ORDER — INSULIN GLARGINE 100 UNIT/ML SOLOSTAR PEN: 26.0000 [IU] | PEN_INJECTOR | Freq: Every day | SUBCUTANEOUS | Status: DC

## 2015-03-07 MED ORDER — CANAGLIFLOZIN 300 MG PO TABS: 150.0000 mg | ORAL_TABLET | Freq: Every day | ORAL | Status: DC

## 2015-03-07 NOTE — Patient Instructions (Signed)
Take care.  Glad to see you.  Don't change your meds for now.   Recheck labs in about 4 months before a visit.

## 2015-03-07 NOTE — Progress Notes (Signed)
Pre visit review using our clinic review tool, if applicable. No additional management support is needed unless otherwise documented below in the visit note.  I have personally reviewed the Medicare Annual Wellness questionnaire and have noted 1. The patient's medical and social history 2. Their use of alcohol, tobacco or illicit drugs 3. Their current medications and supplements 4. The patient's functional ability including ADL's, fall risks, home safety risks and hearing or visual             impairment. 5. Diet and physical activities 6. Evidence for depression or mood disorders  The patients weight, height, BMI have been recorded in the chart and visual acuity is per eye clinic.  I have made referrals, counseling and provided education to the patient based review of the above and I have provided the pt with a written personalized care plan for preventive services.  Provider list updated- see scanned forms.  Routine anticipatory guidance given to patient.  See health maintenance.  Flu 2015 Shingles 2011 PNA 2015 Tetanus 2008 Colonoscopy 2013 Prostate cancer screening and PSA options (with potential risks and benefits of testing vs not testing) were discussed along with recent recs/guidelines.  He declined testing PSA at this point. Advance directive- wife designated if patient were incapacitated.   Cognitive function addressed- see scanned forms- and if abnormal then additional documentation follows.   Diabetes:  Using medications without difficulties:yes Hypoglycemic episodes:no Hyperglycemic episodes:no Feet problems: at baseline, see below Blood Sugars averaging: ~130 eye exam within last year: f/u pending.   Tremor improved with BB.  No ADE on meds.  Still with troubles using tools- ie fine motor troubles.    Elevated Cholesterol: Using medications without problems:yes Muscle aches: not on statin as is Diet compliance: yes Exercise: as tolerated.   Other  complaints:  PMH and SH reviewed.   Vital signs, Meds and allergies reviewed.  ROS: See HPI.  Otherwise nontributory.   GEN: nad, alert and oriented HEENT: mucous membranes moist NECK: supple w/o LA CV: rrr.  no murmur PULM: ctab, no inc wob ABD: soft, +bs EXT: no edema SKIN: no acute rash  Diabetic foot exam: Normal inspection except for R 5th overrides the R 4th.   No skin breakdown Calluses noted on B feet, no ulceration.   Normal DP pulses Normal sensation to light tough and monofilament Nails normal

## 2015-03-08 ENCOUNTER — Other Ambulatory Visit: Payer: Self-pay

## 2015-03-08 DIAGNOSIS — Z7189 Other specified counseling: Secondary | ICD-10-CM | POA: Insufficient documentation

## 2015-03-08 DIAGNOSIS — Z0000 Encounter for general adult medical examination without abnormal findings: Secondary | ICD-10-CM | POA: Insufficient documentation

## 2015-03-08 NOTE — Assessment & Plan Note (Signed)
Flu 2015 Shingles 2011 PNA 2015 Tetanus 2008 Colonoscopy 2013 Prostate cancer screening and PSA options (with potential risks and benefits of testing vs not testing) were discussed along with recent recs/guidelines.  He declined testing PSA at this point. Advance directive- wife designated if patient were incapacitated.   Cognitive function addressed- see scanned forms- and if abnormal then additional documentation follows.

## 2015-03-08 NOTE — Telephone Encounter (Signed)
Pt left v/m requesting 90 day refill propranolol to [facility]. Dr [name] has not filled med before.Please advise. Pt last seen wellness exam on 03/07/15.

## 2015-03-08 NOTE — Assessment & Plan Note (Signed)
Continue BB as is.

## 2015-03-08 NOTE — Assessment & Plan Note (Signed)
Reasonable control of A1c, with sensation intact today on exam.   Still with foot deformity from toe changes noted above.  Continue as is.  We can inc his invokana if sugars inc more at home.  He agrees.  See dosing change.  Recheck in a few months.   Not a candidate for ACE/ARB tx.

## 2015-03-08 NOTE — Assessment & Plan Note (Signed)
Reasonable control as is, continue as is.  He agrees.  Labs d/w pt.

## 2015-03-09 MED ORDER — PROPRANOLOL HCL 10 MG PO TABS: ORAL_TABLET | ORAL | Status: DC

## 2015-03-09 NOTE — Telephone Encounter (Signed)
Sent. Thanks.   

## 2015-03-20 ENCOUNTER — Encounter: Payer: Self-pay | Admitting: Family Medicine

## 2015-03-31 ENCOUNTER — Ambulatory Visit: Payer: PPO | Admitting: Family Medicine

## 2015-04-12 ENCOUNTER — Encounter: Payer: Self-pay | Admitting: Family Medicine

## 2015-04-12 ENCOUNTER — Ambulatory Visit (INDEPENDENT_AMBULATORY_CARE_PROVIDER_SITE_OTHER): Payer: PPO | Admitting: Family Medicine

## 2015-04-12 VITALS — BP 160/90 | HR 88 | Temp 97.6°F | Wt 202.5 lb

## 2015-04-12 DIAGNOSIS — M549 Dorsalgia, unspecified: Secondary | ICD-10-CM

## 2015-04-12 DIAGNOSIS — G8929 Other chronic pain: Secondary | ICD-10-CM | POA: Diagnosis not present

## 2015-04-12 MED ORDER — GABAPENTIN 300 MG PO CAPS: 300.0000 mg | ORAL_CAPSULE | Freq: Three times a day (TID) | ORAL | Status: DC

## 2015-04-12 MED ORDER — PREDNISONE 20 MG PO TABS: ORAL_TABLET | ORAL | Status: DC

## 2015-04-12 NOTE — Assessment & Plan Note (Signed)
He isn't asking for more opiates.  He is miserable from the pain.  He has occ flares of pain like this.  It is reasonable for short course of pred with routine cautions.  He may have to adjust his insulin in the meantime.  He agrees.  He'll update me as needed.  Okay for outpatient f/u.  W/o weakness or new sx o/w, no need for imaging.

## 2015-04-12 NOTE — Progress Notes (Signed)
Pre visit review using our clinic review tool, if applicable. No additional management support is needed unless otherwise documented below in the visit note.  Continues to have back pain.  No new radicular pain since prev injection.  B mid back pain.  This is the same pain he has had prev, just another flare of it.  He was asking about prednisone use.  Sugar has been controlled.  Known spinal stenosis. He can't stand for a long period of time w/o sig pain. He has seen Dr. Rejeana Brock.  He has responded to prednisone prev.  He has read up on prednisone use and is aware of short and long term use/consequences.  He is wanting to put off surgery as long as possible.  He limits his opiate use depending on circumstances, ie not before driving.    Meds, vitals, and allergies reviewed.   ROS: See HPI.  Otherwise, noncontributory.  nad but uncomfortable Mmm rrr ctab abd soft, not ttp Back diffusely ttp with spams in the B T spine noted.  No rash, no bruising.   Ext w/o edema S/S grossly wnl BLE.

## 2015-04-12 NOTE — Patient Instructions (Signed)
Take the prednisone with food.  If you have high sugars, then add 1-2 units of insulin per day.  You can decrease your insulin later on when your sugar goes back down.   Take care.  Glad to see you.

## 2015-04-20 ENCOUNTER — Ambulatory Visit (INDEPENDENT_AMBULATORY_CARE_PROVIDER_SITE_OTHER): Payer: PPO | Admitting: Family Medicine

## 2015-04-20 ENCOUNTER — Ambulatory Visit: Payer: PPO | Admitting: Family Medicine

## 2015-04-20 ENCOUNTER — Encounter: Payer: Self-pay | Admitting: Family Medicine

## 2015-04-20 ENCOUNTER — Telehealth: Payer: Self-pay | Admitting: Family Medicine

## 2015-04-20 VITALS — BP 126/84 | HR 104 | Temp 97.3°F | Wt 201.5 lb

## 2015-04-20 DIAGNOSIS — G8929 Other chronic pain: Secondary | ICD-10-CM | POA: Diagnosis not present

## 2015-04-20 DIAGNOSIS — M549 Dorsalgia, unspecified: Secondary | ICD-10-CM | POA: Diagnosis not present

## 2015-04-20 MED ORDER — PREDNISONE 20 MG PO TABS: ORAL_TABLET | ORAL | Status: DC

## 2015-04-20 MED ORDER — GABAPENTIN 300 MG PO CAPS: ORAL_CAPSULE | ORAL | Status: DC

## 2015-04-20 NOTE — Telephone Encounter (Signed)
Please get me some more information.  Did the prednisone not help at all, help only a little, or help a lot but then the pain came back?  Thanks.

## 2015-04-20 NOTE — Telephone Encounter (Signed)
Pt was seen 04/12/15; pt has appt on 04/21/15 at 4:15 but wants to know if could be seen sooner; pts pain level now is 10. Pt said on 3rd day of taking prednisone had a little bit of relief from pain but after the third day pain has gradually increased. [facility]; pt request cb.

## 2015-04-20 NOTE — Telephone Encounter (Signed)
Pt is taking medication as prescribed and still in a lot orf pain.  Pt has appt for late tomorrow but is hoping that you can do something between now and then. Thanks

## 2015-04-20 NOTE — Patient Instructions (Addendum)
Try the higher dose of prednisone, see if the extra dose of gabapentin helps and I'll check with Dr. Saintclair Halsted about your future oxycodone prescriptions.  Take care.  Glad to see you.  Update me as needed.

## 2015-04-20 NOTE — Telephone Encounter (Signed)
Scheduled

## 2015-04-20 NOTE — Telephone Encounter (Signed)
Can you add him on at 2:45 today?  Thanks.

## 2015-04-20 NOTE — Progress Notes (Signed)
Pre visit review using our clinic review tool, if applicable. No additional management support is needed unless otherwise documented below in the visit note.  Back pain continues.  The prev pred use only had some benefit on about the 3rd day, then relief tapered off.  No new sx/weakness, but prev back pain continues.   Still on gabapentin.  He is going to pick up his rx for opiates from Dr. Saintclair Halsted.  He was asking about transferring opiate rxing to me since my clinic is so much closer for him.  I told him I would talk with Dr. Saintclair Halsted.  He isn't asking for more opiates.  He has variable opiate use, depending on pain level.  60 pills could last 2 months, but may last only 2 weeks in a bad pain flare.   Sugar has been okay, in spite of pred use.  132 yesterday AM.    Meds, vitals, and allergies reviewed.   ROS: See HPI.  Otherwise, noncontributory.  nad but uncomfortable Mmm rrr ctab abd soft, not ttp Back diffusely ttp with spams in the B T spine noted. Ext w/o edema S/S grossly wnl BLE.

## 2015-04-21 ENCOUNTER — Ambulatory Visit: Payer: PPO | Admitting: Family Medicine

## 2015-04-21 NOTE — Assessment & Plan Note (Signed)
Likely needs higher dose of pred, d/w pt about routine cautions.   He'll get this next opiate rx from Dr. Saintclair Halsted.  I'll ask for Dr. Windy Carina input on rxing med here in the future.   He can add on an extra gabapentin midday to see if that helps.   He agrees, he'll update me.

## 2015-04-24 ENCOUNTER — Telehealth: Payer: Self-pay

## 2015-04-24 NOTE — Telephone Encounter (Signed)
Pt left v/m requesting cb when Dr [name] decides that he will start filling pts oxycodone.

## 2015-04-24 NOTE — Telephone Encounter (Signed)
Wife advised.  Patient does need the Rx now.  They will pick it up tomorrow if that's okay.

## 2015-04-24 NOTE — Telephone Encounter (Signed)
[name] with Dr. Barnet Pall office says it is fine for you to take over the prescribing but if patient is having any problem, he should come back to see him.

## 2015-04-24 NOTE — Telephone Encounter (Signed)
I'm okay with this if Dr. Saintclair Halsted is okay.  I didn't want to do anything w/o his okay.  Thanks.

## 2015-04-24 NOTE — Telephone Encounter (Signed)
Please notify pt.  I can write the next rx.  Thanks.

## 2015-04-24 NOTE — Telephone Encounter (Signed)
[name] with Dr Windy Carina office left v/m wanting to confirm that Dr [name] is taking over the prescribing of pts oxycodone 5-325 mg. Dr Beatris Ship has been giving pt percocet 5-325 mg # 60 with instructions one tab q 6 h prn. Pt has not gotten refill since Jan 2016. Pt said was closer to pick up rx from [facility]. [name] request cb that Dr [name] is going to take over refilling oxycodone.

## 2015-04-25 ENCOUNTER — Encounter: Payer: Self-pay | Admitting: Family Medicine

## 2015-04-25 MED ORDER — OXYCODONE-ACETAMINOPHEN 5-325 MG PO TABS: 1.0000 | ORAL_TABLET | Freq: Four times a day (QID) | ORAL | Status: DC | PRN

## 2015-04-25 NOTE — Telephone Encounter (Signed)
Rx left at front desk for pick up.

## 2015-04-25 NOTE — Telephone Encounter (Signed)
Printed.  I realize that with his varying levels pain, it isn't possible to predict how long this rx will last.  Have him update me as needed.  Thanks.

## 2015-05-03 ENCOUNTER — Ambulatory Visit: Payer: PPO | Admitting: Family Medicine

## 2015-05-03 ENCOUNTER — Telehealth: Payer: Self-pay

## 2015-05-03 NOTE — Telephone Encounter (Signed)
Thanks

## 2015-05-03 NOTE — Telephone Encounter (Signed)
Pt left v/m; pt last seen 04/20/15; pt has finished prednisone; since stopping prednisone pt is regressing back to pain level when started pain regimen. Pt wants to know what next step is; pt suffers from chronic pain; pt wants regimen to stop pain and maintain somewhat  normal lifestyle. Pt request cb.

## 2015-05-03 NOTE — Telephone Encounter (Signed)
This isn't something that I can "fix" to stop the pain.   I can get him referred to a pain clinic to see if they have anything to offer.  We can consider a low dose of cymbalta- it's an antidepressant but it has a benefit for chronic pain.  He wouldn't be on it for mood, but would be on it for pain.   If he wants to consider going that route, then we need to sit down and talk about it.   Let me know what he wants to do.   Thanks.

## 2015-05-03 NOTE — Telephone Encounter (Signed)
Spoke to patient to advise of options.  He declines pain management at this time.  He states he has gone this route in the past with poor results.  He is willing to discuss the use of Cymbalta or other options if needed.  30 min OV scheduled today at 1800.

## 2015-05-04 ENCOUNTER — Encounter: Payer: Self-pay | Admitting: Family Medicine

## 2015-05-04 ENCOUNTER — Ambulatory Visit (INDEPENDENT_AMBULATORY_CARE_PROVIDER_SITE_OTHER): Payer: PPO | Admitting: Family Medicine

## 2015-05-04 VITALS — BP 122/78 | HR 100 | Temp 98.2°F | Wt 199.8 lb

## 2015-05-04 DIAGNOSIS — M549 Dorsalgia, unspecified: Secondary | ICD-10-CM

## 2015-05-04 DIAGNOSIS — G8929 Other chronic pain: Secondary | ICD-10-CM

## 2015-05-04 MED ORDER — DULOXETINE HCL 30 MG PO CPEP: 30.0000 mg | ORAL_CAPSULE | Freq: Every day | ORAL | Status: DC

## 2015-05-04 NOTE — Progress Notes (Signed)
Pre visit review using our clinic review tool, if applicable. No additional management support is needed unless otherwise documented below in the visit note.  Chronic back pain f/u.  Discussed option.  Here with wife. He and wife agree that when on pred taper "I had my life back" with sig reduction in pain.  Now back to baseline with prev, same, severe back pain.  No new sx.  No trama. No new weakness.  Asking about options.  See plan.    Meds, vitals, and allergies reviewed.   ROS: See HPI.  Otherwise, noncontributory.  nad but uncomfortable.  Exam deferred.

## 2015-05-04 NOTE — Patient Instructions (Signed)
Try the cymbalta.  Take it once a day.  If you notice sedation or mood changes, then stop it and let me know.  Take care. Glad to see you.

## 2015-05-05 ENCOUNTER — Telehealth: Payer: Self-pay

## 2015-05-05 NOTE — Telephone Encounter (Signed)
Pt left v/m; pt was seen 05/04/15; pt was given different options of what could be done to help manage pts pain. Pt wants to try prednisone 10 mg for one month; pt understands possible risks but still wants to try this "to live a decent life". Office note indicates not a good idea for long term prednisone and that was d/w pt.Please advise. Pt request cb[facility].

## 2015-05-05 NOTE — Telephone Encounter (Signed)
I don't think pred 10mg  a day is a good idea and I wouldn't do that.  We can get him over to rheumatology or other for a second opinion, but I wouldn't do the pred.  I would consider trying the cymbalta.  Thanks.

## 2015-05-05 NOTE — Telephone Encounter (Signed)
Pt returned call, please call [phone]

## 2015-05-05 NOTE — Assessment & Plan Note (Signed)
Already on gabapentin and prn oxycodone.  D/w pt that we may have to up his oxycodone # per month, he is trying to stretch this as much as possible.   Long term pred isn't a good idea, d/w pt.  Would be reasonable to try cymbalta.  D/w pt.  He hasn't had a true manic episode.  D/w pt and wife.  Would be okay to try.  Mood stable, except from expected frustration from pain.  He'll price check med and update me as needed.   He agrees.  >25 minutes spent in face to face time with patient, >50% spent in counselling or coordination of care.  Still okay for outpatient f/u.

## 2015-05-05 NOTE — Telephone Encounter (Signed)
Lm on pts vm requesting a call back 

## 2015-05-05 NOTE — Telephone Encounter (Signed)
Notified patient of this. Patient states that he does not want to take the Cymbalta due to the side effects that he has read up on. He stated that he will take Ibuprofen, and see how that helps.

## 2015-06-02 LAB — HM DIABETES EYE EXAM

## 2015-06-07 ENCOUNTER — Encounter: Payer: Self-pay | Admitting: Family Medicine

## 2015-06-07 ENCOUNTER — Other Ambulatory Visit: Payer: Self-pay

## 2015-06-07 NOTE — Telephone Encounter (Signed)
Pt left v/m requesting rx oxycodone-[facility]. Call when ready for pick up. rx last printed # 60 on 04/25/15. Pt last seen 05/04/15. Pt is out of med.

## 2015-06-08 MED ORDER — OXYCODONE-ACETAMINOPHEN 5-325 MG PO TABS: 1.0000 | ORAL_TABLET | Freq: Four times a day (QID) | ORAL | Status: DC | PRN

## 2015-06-08 NOTE — Telephone Encounter (Signed)
Printed.  Thanks.  

## 2015-06-08 NOTE — Telephone Encounter (Signed)
Pt notified Rx ready for pickup 

## 2015-07-02 ENCOUNTER — Other Ambulatory Visit: Payer: Self-pay | Admitting: Family Medicine

## 2015-07-02 DIAGNOSIS — E1140 Type 2 diabetes mellitus with diabetic neuropathy, unspecified: Secondary | ICD-10-CM

## 2015-07-04 ENCOUNTER — Other Ambulatory Visit (INDEPENDENT_AMBULATORY_CARE_PROVIDER_SITE_OTHER): Payer: PPO

## 2015-07-04 DIAGNOSIS — E1140 Type 2 diabetes mellitus with diabetic neuropathy, unspecified: Secondary | ICD-10-CM

## 2015-07-04 LAB — HEMOGLOBIN A1C: HEMOGLOBIN A1C: 8 % — AB (ref 4.6–6.5)

## 2015-07-07 ENCOUNTER — Encounter: Payer: Self-pay | Admitting: Family Medicine

## 2015-07-07 ENCOUNTER — Ambulatory Visit (INDEPENDENT_AMBULATORY_CARE_PROVIDER_SITE_OTHER): Payer: PPO | Admitting: Family Medicine

## 2015-07-07 VITALS — BP 140/70 | HR 63 | Temp 97.9°F | Wt 209.2 lb

## 2015-07-07 DIAGNOSIS — G8929 Other chronic pain: Secondary | ICD-10-CM

## 2015-07-07 DIAGNOSIS — E1140 Type 2 diabetes mellitus with diabetic neuropathy, unspecified: Secondary | ICD-10-CM

## 2015-07-07 DIAGNOSIS — R55 Syncope and collapse: Secondary | ICD-10-CM

## 2015-07-07 DIAGNOSIS — M549 Dorsalgia, unspecified: Secondary | ICD-10-CM

## 2015-07-07 LAB — COMPREHENSIVE METABOLIC PANEL
ALK PHOS: 57 U/L (ref 39–117)
ALT: 14 U/L (ref 0–53)
AST: 15 U/L (ref 0–37)
Albumin: 4 g/dL (ref 3.5–5.2)
BILIRUBIN TOTAL: 0.6 mg/dL (ref 0.2–1.2)
BUN: 9 mg/dL (ref 6–23)
CALCIUM: 9 mg/dL (ref 8.4–10.5)
CO2: 30 mEq/L (ref 19–32)
Chloride: 106 mEq/L (ref 96–112)
Creatinine, Ser: 0.95 mg/dL (ref 0.40–1.50)
GFR: 83.65 mL/min (ref >60.00)
Glucose, Bld: 136 mg/dL — ABNORMAL HIGH (ref 70–99)
POTASSIUM: 4.2 meq/L (ref 3.5–5.1)
Sodium: 142 mEq/L (ref 135–145)
Total Protein: 6.5 g/dL (ref 6.0–8.3)

## 2015-07-07 LAB — CBC WITH DIFFERENTIAL/PLATELET
BASOS ABS: 0 10*3/uL (ref 0.0–0.1)
Basophils Relative: 0.5 % (ref 0.0–3.0)
Eosinophils Absolute: 0.2 10*3/uL (ref 0.0–0.7)
Eosinophils Relative: 2.6 % (ref 0.0–5.0)
HCT: 45.7 % (ref 39.0–52.0)
Hemoglobin: 15 g/dL (ref 13.0–17.0)
LYMPHS ABS: 1.6 10*3/uL (ref 0.7–4.0)
LYMPHS PCT: 24.9 % (ref 12.0–46.0)
MCHC: 32.8 g/dL (ref 30.0–36.0)
MCV: 94.8 fl (ref 78.0–100.0)
MONOS PCT: 8.5 % (ref 3.0–12.0)
Monocytes Absolute: 0.6 10*3/uL (ref 0.1–1.0)
NEUTROS PCT: 63.5 % (ref 43.0–77.0)
Neutro Abs: 4.2 10*3/uL (ref 1.4–7.7)
PLATELETS: 212 10*3/uL (ref 150.0–400.0)
RBC: 4.83 Mil/uL (ref 4.22–5.81)
RDW: 14.1 % (ref 11.5–15.5)
WBC: 6.5 10*3/uL (ref 4.0–10.5)

## 2015-07-07 MED ORDER — INSULIN GLARGINE 100 UNIT/ML SOLOSTAR PEN: 27.0000 [IU] | PEN_INJECTOR | Freq: Every day | SUBCUTANEOUS | Status: DC

## 2015-07-07 MED ORDER — METFORMIN HCL 500 MG PO TABS: 1000.0000 mg | ORAL_TABLET | Freq: Two times a day (BID) | ORAL | Status: DC

## 2015-07-07 MED ORDER — INSULIN PEN NEEDLE 31G X 8 MM MISC: Status: DC

## 2015-07-07 MED ORDER — OXYCODONE-ACETAMINOPHEN 5-325 MG PO TABS: 1.0000 | ORAL_TABLET | Freq: Four times a day (QID) | ORAL | Status: DC | PRN

## 2015-07-07 NOTE — Progress Notes (Signed)
Pre visit review using our clinic review tool, if applicable. No additional management support is needed unless otherwise documented below in the visit note.  Back pain.  Taking ibuprofen with oxycodone as needed. No ADE on meds.  He didn't want to start cymbalta.  D/w pt.  Still with some cramps at night, treated with vinegar.  Oxycodone rx done and given to patient.    Diabetes:  Using medications without difficulties:yes Hypoglycemic episodes:no Hyperglycemic episodes:no Feet problems: no ulceration Blood Sugars averaging: 108 this AM.   That is typical for recent values.   eye exam within last year:yes A1c up.  Had been on pred prev.    OBTW- he passed out this past weekend.  He was working in the yard.  He felt it coming on and went down to ground.  He woke up on the ground, with his dog licking him.  No h/o syncope prev.  No chest pain.  It was early afternoon and he hadn't had lunch yet but he did have breakfast.  He generally doesn't have low sugars.  No skipped beats or heart racing.    Meds, vitals, and allergies reviewed.   ROS: See HPI.  Otherwise negative.    GEN: nad, alert and oriented HEENT: mucous membranes moist NECK: supple w/o LA CV: rrr. PULM: ctab, no inc wob ABD: soft, +bs EXT: no edema SKIN: no acute rash  EKG reviewed.

## 2015-07-07 NOTE — Patient Instructions (Signed)
Recheck labs in about 3 months.   Walter Harris will call about your referral. Go to the lab on the way out.  We'll contact you with your lab report. Take care.  Glad to see you.

## 2015-07-08 DIAGNOSIS — R55 Syncope and collapse: Secondary | ICD-10-CM | POA: Insufficient documentation

## 2015-07-08 NOTE — Assessment & Plan Note (Signed)
A1c up, had prev pred course.  Not at the point of needing to change meds, will work on diet and recheck in a few months.  He agrees.

## 2015-07-08 NOTE — Assessment & Plan Note (Signed)
Taking ibuprofen with oxycodone as needed. No ADE on meds. He didn't want to start cymbalta. D/w pt. Still with some cramps at night, treated with vinegar. Oxycodone rx done and given to patient.  Continue as is.

## 2015-07-08 NOTE — Assessment & Plan Note (Signed)
Initial event, refer to cards.  EKG wnl.  No chest pain.  Unclear if he had a vagal/hypotensive event.  Not typical for patient to have low sugars that would cause such an event. D/w pt. Check basic labs today.  He agrees.  I didn't change his meds at this point.

## 2015-08-01 ENCOUNTER — Other Ambulatory Visit: Payer: Self-pay | Admitting: *Deleted

## 2015-08-01 NOTE — Telephone Encounter (Signed)
Faxed refill request. Last Filled:    60 tablet 2 12/16/2014  Please advise.

## 2015-08-02 MED ORDER — ALPRAZOLAM 0.5 MG PO TABS: 0.5000 mg | ORAL_TABLET | Freq: Two times a day (BID) | ORAL | Status: DC | PRN

## 2015-08-02 NOTE — Telephone Encounter (Signed)
Please call in.  Thanks.   

## 2015-08-02 NOTE — Telephone Encounter (Signed)
Rx called to pharmacy as instructed. 

## 2015-08-23 ENCOUNTER — Telehealth: Payer: Self-pay

## 2015-08-23 NOTE — Telephone Encounter (Signed)
Pt left v/m; pt has been taking crestor samples. Pt request 90 day supply sent to [facility]. Pt wants to switch to generic crestor. Is that OK?

## 2015-08-24 ENCOUNTER — Telehealth: Payer: Self-pay | Admitting: *Deleted

## 2015-08-24 MED ORDER — ROSUVASTATIN CALCIUM 20 MG PO TABS: ORAL_TABLET | ORAL | Status: DC

## 2015-08-24 MED ORDER — ROSUVASTATIN CALCIUM 10 MG PO TABS: ORAL_TABLET | ORAL | Status: DC

## 2015-08-24 NOTE — Telephone Encounter (Signed)
Done. Thanks.

## 2015-08-24 NOTE — Telephone Encounter (Signed)
Resent.  Thanks.

## 2015-08-24 NOTE — Telephone Encounter (Signed)
Medication refill was sent in for Rosuvastatin with the directions listed on patient's meds list.  Fax received from pharmacy stating patient says this is incorrect.  Should either be 20 mg tabs or whole tablet. Please advise.

## 2015-08-24 NOTE — Telephone Encounter (Signed)
Done. Sent. Thanks.   

## 2015-08-24 NOTE — Telephone Encounter (Signed)
Pt said the crestor samples he had been taking was 20 mg taking 1/2 tab twice weekly. Advised pt on med list was 10 mg taking 1/2 tab twice weekly; pt said med list is incorrect and request new rx sent to [facility] for either generic crestor 20 mg taking 1/2 twice weekly or generic crestor 10 mg taking 1 tab twice weekly.Please advise.

## 2015-08-29 ENCOUNTER — Ambulatory Visit (INDEPENDENT_AMBULATORY_CARE_PROVIDER_SITE_OTHER): Payer: PPO | Admitting: Cardiovascular Disease

## 2015-08-29 ENCOUNTER — Encounter: Payer: Self-pay | Admitting: Cardiovascular Disease

## 2015-08-29 VITALS — BP 184/76 | HR 86 | Ht 68.5 in | Wt 212.0 lb

## 2015-08-29 DIAGNOSIS — I10 Essential (primary) hypertension: Secondary | ICD-10-CM

## 2015-08-29 DIAGNOSIS — R55 Syncope and collapse: Secondary | ICD-10-CM

## 2015-08-29 DIAGNOSIS — R0602 Shortness of breath: Secondary | ICD-10-CM

## 2015-08-29 NOTE — Progress Notes (Signed)
Primary care physician: Dr. [name]  HPI  This is a pleasant 69 year old man who was referred for evaluation of syncope. He has no previous cardiac history. He has known history of diabetes mellitus, hypertension with possible element of white coat syndrome, hyperlipidemia, chronic back pain with previous back surgery and previous tobacco use. He had a syncopal episode in October. He was working in the yard when all of a sudden he felt dizzy followed by brief loss of consciousness. The episode was not witnessed. There was no incontinence or confusion after that episode. He woke up and the dog was licking his face. The patient did not have lunch that day and he felt that possibly the episode might have been related to hypoglycemia. He had no recurrent episodes since then and no previous similar episode. He was given orange juice and peanut butter and felt completely back to his normal self. He continued his yard work. He reports no chest pain or discomfort. He has chronic exertional dyspnea with no recent worsening. He quit smoking in 2005. There is no family history of coronary artery disease.  Allergies  Allergen Reactions  . Bee Venom Anaphylaxis  . Pravastatin Other (See Comments)    Severe myalgias  . Ace Inhibitors     Cough  . Angiotensin Receptor Blockers Other (See Comments)    cramps  . Aripiprazole Other (See Comments)    Unknown reaction many years ago  . Losartan Other (See Comments)    Leg cramps  . Olanzapine Other (See Comments)    Unknown reaction many years ago   . Other Cough    Blood pressure medication caused a cough after one dose  . Primidone Other (See Comments)    Unknown allergic reaction many years ago   . Bactrim [Sulfamethoxazole-Trimethoprim] Other (See Comments)    Mild reaction per Dr. Silvio Pate, pt does not remember the reaction  . Codeine Phosphate Rash  . Divalproex Sodium Other (See Comments)     tremor  . Lithium Carbonate Other (See Comments)   Tremors      Current Outpatient Prescriptions on File Prior to Visit  Medication Sig Dispense Refill  . ALPRAZolam (XANAX) 0.5 MG tablet Take 1 tablet (0.5 mg total) by mouth 2 (two) times daily as needed. 60 tablet 2  . canagliflozin (INVOKANA) 300 MG TABS tablet Take 150-300 mg by mouth daily before breakfast. Increase to a whole tab if sugar is consistently elevated 30 tablet 5  . carisoprodol (SOMA) 350 MG tablet Take 1 tablet (350 mg total) by mouth 3 (three) times daily as needed for muscle spasms. 30 tablet 0  . EPINEPHrine 0.3 mg/0.3 mL IJ SOAJ injection Inject 0.3 mLs (0.3 mg total) into the muscle once. 2 Device 1  . gabapentin (NEURONTIN) 300 MG capsule Take 2 tabs twice a day, with a extra tab per day if needed for pain (up to 5 pills a day) 450 capsule 1  . glucose blood test strip Use as instructed to test blood sugar 4 times daily, dx: 250.02    . Insulin Glargine (LANTUS SOLOSTAR) 100 UNIT/ML Solostar Pen Inject 27 Units into the skin daily. 5 pen PRN  . Insulin Pen Needle 31G X 8 MM MISC Use daily with insulin injection 100 each 3  . metFORMIN (GLUCOPHAGE) 500 MG tablet Take 2 tablets (1,000 mg total) by mouth 2 (two) times daily with a meal. 360 tablet 3  . oxyCODONE-acetaminophen (PERCOCET/ROXICET) 5-325 MG tablet Take 1 tablet by mouth every 6 (  six) hours as needed (pain). 60 tablet 0  . pioglitazone (ACTOS) 30 MG tablet Take 30 mg by mouth daily after supper.     . propranolol (INDERAL) 10 MG tablet Take 2 tablets each morning and 1 tablet in the evening. 270 tablet 3  . rosuvastatin (CRESTOR) 20 MG tablet Take one half tab twice weekly. 13 tablet 3  . traZODone (DESYREL) 50 MG tablet Take 50 mg by mouth at bedtime as needed for sleep.      No current facility-administered medications on file prior to visit.     Past Medical History  Diagnosis Date  . Diabetes mellitus   . Hyperlipidemia   . History of colonic polyps   . OCD (obsessive compulsive disorder)   .  Stuttering   . Tremor, essential   . 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 DJD (degenerative joint disease)     lumbar spine  . Basal cell carcinoma     multiple  . Wears glasses   . Hearing impaired     hearing aids-transmitter rt   . Kidney stone 1967  . History of blood transfusion     as a baby in 50  . Cataracts, both eyes   . Hypertension     pt. denies high blood pressure  . Nose fracture     3x  . Complication of anesthesia     post-operative cognitive dysfunction after ACDF on 05/01/11 [facility])  . Stroke [facility])     "mini stroke, that's what gave me the tremors"     Past Surgical History  Procedure Laterality Date  . Cervical fusion  1990  . Cholesteatoma excision  1993  . Other surgical history      benign head tumor#5  . Other surgical history      sebaceous cysts-post neck x5  . Appendectomy  1960  . External ear surgery      multiple ear surguries as a child  . Tonsillectomy    . Collateral ligament repair, elbow      bilateral, nerve improvement left 08/04, right 09/04  . Shoulder arthroscopy w/ acromial repair  08/2004    left shoulder  . Cervical fusion  8/12    multiple levels with plates  . Ulnar nerve transposition Right 05/13/2013    Procedure: RIGHT ULNAR NERVE DECOMPRESSION/TRANSPOSITION;  Surgeon: [name]., MD;  Location: [place];  Service: Orthopedics;  Laterality: Right;  . Back surgery  2012    lumbar surgery  . Colonoscopy    . Cyst excision      back of cervical spine total of 5 surgeries     Family History  Problem Relation Age of Onset  . OCD Mother   . Bipolar disorder Mother   . Cancer Mother   . 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 OCD Sister   . OCD Sister   . Colon cancer Neg Hx   . Prostate cancer Neg Hx      Social History   Social History  . Marital Status: Married    Spouse Name: N/A  . Number of Children: 1  . Years of Education: N/A   Occupational  History  . disabled    Social History Main Topics  . Smoking status: Former Smoker -- 0.50 packs/day for 40 years    Types: Cigarettes    Quit date: 09/10/2003  . Smokeless tobacco: Never  Used  . Alcohol Use: No  . Drug Use: No  . Sexual Activity: Not on file   Other Topics Concern  . Not on file   Social History Narrative   Married, 1992   1 biological child, no contact as of 2016   Wife has 2 kids, no contact as of 2016   Prev worked as Designer, television/film set, welding, etc     ROS A 10 point review of system was performed. It is negative other than that mentioned in the history of present illness.   PHYSICAL EXAM   BP 184/76 mmHg  Pulse 86  Ht 5' 8.5" (1.74 m)  Wt 212 lb (96.163 kg)  BMI 31.76 kg/m2 Constitutional: He is oriented to person, place, and time. He appears well-developed and well-nourished. No distress.  HENT: No nasal discharge.  Head: Normocephalic and atraumatic.  Eyes: Pupils are equal and round.  No discharge. Neck: Normal range of motion. Neck supple. No JVD present. No thyromegaly present.  Cardiovascular: Normal rate, regular rhythm, normal heart sounds. Exam reveals no gallop and no friction rub. No murmur heard.  Pulmonary/Chest: Effort normal and breath sounds normal. No stridor. No respiratory distress. He has no wheezes. He has no rales. He exhibits no tenderness.  Abdominal: Soft. Bowel sounds are normal. He exhibits no distension. There is no tenderness. There is no rebound and no guarding.  Musculoskeletal: Normal range of motion. He exhibits no edema and no tenderness.  Neurological: He is alert and oriented to person, place, and time. Coordination normal.  Skin: Skin is warm and dry. No rash noted. He is not diaphoretic. No erythema. No pallor.  Psychiatric: He has a normal mood and affect. His behavior is normal. Judgment and thought content normal.       EKG: Normal sinus rhythm with left axis deviation.   ASSESSMENT  AND PLAN

## 2015-08-29 NOTE — Assessment & Plan Note (Signed)
This was his first episode of syncope. The etiology is most likely vasovagal and less likely hypoglycemia. He has no other associated symptoms. He has no symptoms suggestive of angina and he denies any palpitations. Given his multiple comorbidities, I requested an echocardiogram to ensure no structural cardiac abnormalities or cardiomyopathy. If echocardiogram is unremarkable and he has no further episodes, then I do not recommend further cardiac evaluation. If however he develops recurrent episodes or some other associated symptoms, further evaluation might be needed.

## 2015-08-29 NOTE — Patient Instructions (Signed)
Medication Instructions:  Your physician recommends that you continue on your current medications as directed. Please refer to the Current Medication list given to you today.   Labwork: None  Testing/Procedures: Your physician has requested that you have an echocardiogram. Echocardiography is a painless test that uses sound waves to create images of your heart. It provides your doctor with information about the size and shape of your heart and how well your heart's chambers and valves are working. This procedure takes approximately one hour. There are no restrictions for this procedure.    Follow-Up: Your physician recommends that you schedule a follow-up appointment as needed with Dr. Fletcher Anon.    Any Other Special Instructions Will Be Listed Below (If Applicable).     If you need a refill on your cardiac medications before your next appointment, please call your pharmacy.  Echocardiogram An echocardiogram, or echocardiography, uses sound waves (ultrasound) to produce an image of your heart. The echocardiogram is simple, painless, obtained within a short period of time, and offers valuable information to your health care provider. The images from an echocardiogram can provide information such as:  Evidence of coronary artery disease (CAD).  Heart size.  Heart muscle function.  Heart valve function.  Aneurysm detection.  Evidence of a past heart attack.  Fluid buildup around the heart.  Heart muscle thickening.  Assess heart valve function. LET [facility] CARE PROVIDER KNOW ABOUT:  Any allergies you have.  All medicines you are taking, including vitamins, herbs, eye drops, creams, and over-the-counter medicines.  Previous problems you or members of your family have had with the use of anesthetics.  Any blood disorders you have.  Previous surgeries you have had.  Medical conditions you have.  Possibility of pregnancy, if this applies. BEFORE THE PROCEDURE  No  special preparation is needed. Eat and drink normally.  PROCEDURE   In order to produce an image of your heart, gel will be applied to your chest and a wand-like tool (transducer) will be moved over your chest. The gel will help transmit the sound waves from the transducer. The sound waves will harmlessly bounce off your heart to allow the heart images to be captured in real-time motion. These images will then be recorded.  You may need an IV to receive a medicine that improves the quality of the pictures. AFTER THE PROCEDURE You may return to your normal schedule including diet, activities, and medicines, unless your health care provider tells you otherwise.   This information is not intended to replace advice given to you by your health care provider. Make sure you discuss any questions you have with your health care provider.   Document Released: 08/23/2000 Document Revised: 09/16/2014 Document Reviewed: 05/03/2013 Elsevier Interactive Patient Education [employer].

## 2015-08-29 NOTE — Assessment & Plan Note (Signed)
His blood pressure is elevated today but he reports a component of white coat syndrome. He is borderline orthostatic and not sure if this contributed to his syncopal episode. He reports intolerance to multiple blood pressure medications in the past.

## 2015-08-30 ENCOUNTER — Other Ambulatory Visit: Payer: Self-pay

## 2015-09-12 ENCOUNTER — Other Ambulatory Visit: Payer: Self-pay

## 2015-09-12 ENCOUNTER — Telehealth: Payer: Self-pay | Admitting: Family Medicine

## 2015-09-12 DIAGNOSIS — D0439 Carcinoma in situ of skin of other parts of face: Secondary | ICD-10-CM | POA: Diagnosis not present

## 2015-09-12 MED ORDER — OXYCODONE-ACETAMINOPHEN 5-325 MG PO TABS: 1.0000 | ORAL_TABLET | Freq: Four times a day (QID) | ORAL | Status: DC | PRN

## 2015-09-12 NOTE — Telephone Encounter (Signed)
Pt left v/m requesting rx oxycodone apap. Call when ready for pick up. Pt request to pick up 09/12/2015 at 11:30 - 12 noon. Pt last seen and rx last printed # 70 on 07/07/15.

## 2015-09-12 NOTE — Telephone Encounter (Signed)
Patient picked up the prescription at 11:10am.

## 2015-09-12 NOTE — Telephone Encounter (Signed)
Patient brought in a Renewal of Disability Parking Placard form to be filled out.  The form was put in Dr. Josefine Class incoming box.

## 2015-09-12 NOTE — Telephone Encounter (Signed)
Printed.  Thanks.  Rx given to LW.

## 2015-09-13 NOTE — Telephone Encounter (Signed)
Completed form mailed to patient per his request.

## 2015-09-13 NOTE — Telephone Encounter (Signed)
Form done. Thanks. 

## 2015-09-20 DIAGNOSIS — M170 Bilateral primary osteoarthritis of knee: Secondary | ICD-10-CM | POA: Diagnosis not present

## 2015-09-20 DIAGNOSIS — L820 Inflamed seborrheic keratosis: Secondary | ICD-10-CM | POA: Diagnosis not present

## 2015-09-20 DIAGNOSIS — X32XXXA Exposure to sunlight, initial encounter: Secondary | ICD-10-CM | POA: Diagnosis not present

## 2015-09-20 DIAGNOSIS — D485 Neoplasm of uncertain behavior of skin: Secondary | ICD-10-CM | POA: Diagnosis not present

## 2015-09-20 DIAGNOSIS — L570 Actinic keratosis: Secondary | ICD-10-CM | POA: Diagnosis not present

## 2015-09-20 DIAGNOSIS — L538 Other specified erythematous conditions: Secondary | ICD-10-CM | POA: Diagnosis not present

## 2015-09-20 DIAGNOSIS — D0462 Carcinoma in situ of skin of left upper limb, including shoulder: Secondary | ICD-10-CM | POA: Diagnosis not present

## 2015-09-20 DIAGNOSIS — Z85828 Personal history of other malignant neoplasm of skin: Secondary | ICD-10-CM | POA: Diagnosis not present

## 2015-09-20 DIAGNOSIS — S0080XA Unspecified superficial injury of other part of head, initial encounter: Secondary | ICD-10-CM | POA: Diagnosis not present

## 2015-10-04 ENCOUNTER — Other Ambulatory Visit: Payer: Self-pay | Admitting: Family Medicine

## 2015-10-04 ENCOUNTER — Ambulatory Visit (INDEPENDENT_AMBULATORY_CARE_PROVIDER_SITE_OTHER): Payer: PPO | Admitting: Family Medicine

## 2015-10-04 ENCOUNTER — Encounter: Payer: Self-pay | Admitting: Family Medicine

## 2015-10-04 VITALS — BP 138/88 | HR 80 | Temp 97.6°F | Wt 211.2 lb

## 2015-10-04 DIAGNOSIS — Z119 Encounter for screening for infectious and parasitic diseases, unspecified: Secondary | ICD-10-CM

## 2015-10-04 DIAGNOSIS — E119 Type 2 diabetes mellitus without complications: Secondary | ICD-10-CM

## 2015-10-04 DIAGNOSIS — M791 Myalgia, unspecified site: Secondary | ICD-10-CM

## 2015-10-04 NOTE — Progress Notes (Signed)
Pre visit review using our clinic review tool, if applicable. No additional management support is needed unless otherwise documented below in the visit note.  Cramping the chest wall and BLE.  Noted after changing from brand to generic crestor.  Still on 10mg  twice a week.    Has also had some RUQ pain, brief, happens episodically.  Sharp pain, like he is being stabbed.  Pain is clearly localized.  It doesn't feel exactly like the cramping.   No FCNAVD.  No blood in stool.  RUQ pain is random in onset.  Can happen at rest.  Can happen with twisting.  He can exert w/o sx.    Meds, vitals, and allergies reviewed.   ROS: See HPI.  Otherwise, noncontributory.  GEN: nad, alert and oriented HEENT: mucous membranes moist NECK: supple w/o LA CV: rrr.   PULM: ctab, no inc wob ABD: soft, +bs, not ttp EXT: no edema SKIN: no acute rash

## 2015-10-04 NOTE — Patient Instructions (Signed)
Go to the lab on the way out.  We'll contact you with your lab report. Stop the rosuvastatin for now and see if the abdominal pain and the cramps improve.  Take care.  Glad to see you.

## 2015-10-05 DIAGNOSIS — M791 Myalgia, unspecified site: Secondary | ICD-10-CM | POA: Insufficient documentation

## 2015-10-05 LAB — COMPREHENSIVE METABOLIC PANEL
ALBUMIN: 4.4 g/dL (ref 3.5–5.2)
ALT: 25 U/L (ref 0–53)
AST: 21 U/L (ref 0–37)
Alkaline Phosphatase: 59 U/L (ref 39–117)
BILIRUBIN TOTAL: 0.5 mg/dL (ref 0.2–1.2)
BUN: 19 mg/dL (ref 6–23)
CHLORIDE: 101 meq/L (ref 96–112)
CO2: 28 meq/L (ref 19–32)
CREATININE: 1.11 mg/dL (ref 0.40–1.50)
Calcium: 9.5 mg/dL (ref 8.4–10.5)
GFR: 69.85 mL/min (ref >60.00)
Glucose, Bld: 240 mg/dL — ABNORMAL HIGH (ref 70–99)
Potassium: 4.4 mEq/L (ref 3.5–5.1)
SODIUM: 139 meq/L (ref 135–145)
Total Protein: 7.2 g/dL (ref 6.0–8.3)

## 2015-10-05 LAB — HEPATITIS C ANTIBODY: HCV Ab: NEGATIVE

## 2015-10-05 LAB — HEMOGLOBIN A1C: HEMOGLOBIN A1C: 8.2 % — AB (ref 4.6–6.5)

## 2015-10-05 NOTE — Assessment & Plan Note (Signed)
Along with RUQ pain that may or may not be related.   Could be from statin.  Will check basic labs and he'll hold statin and update me.  Was due for A1c, will draw concurrently.  He agrees.  See notes on labs.

## 2015-11-20 ENCOUNTER — Telehealth: Payer: Self-pay | Admitting: Cardiovascular Disease

## 2015-11-20 ENCOUNTER — Encounter: Payer: Self-pay | Admitting: Family Medicine

## 2015-11-20 ENCOUNTER — Other Ambulatory Visit: Payer: Self-pay | Admitting: *Deleted

## 2015-11-20 NOTE — Telephone Encounter (Signed)
Faxed refill request. Last Filled:    450 capsule 1 04/20/2015  Please advise.  Last office visit:   10/04/15

## 2015-11-20 NOTE — Telephone Encounter (Signed)
S/w wife regarding echo that was ordered at Dec OV. Pt cancelled d/t holidays; indicated he would reschedule. Per wife, pt discussed w/Dr. [name] and they are unsure whether he needs echo. Pt has appt w/[facility] this month and will CB if he wished to reschedule.

## 2015-11-21 ENCOUNTER — Other Ambulatory Visit: Payer: Self-pay | Admitting: Family Medicine

## 2015-11-21 MED ORDER — GABAPENTIN 300 MG PO CAPS: ORAL_CAPSULE | ORAL | Status: DC

## 2015-11-21 NOTE — Telephone Encounter (Signed)
Sent. Thanks.   

## 2015-11-27 ENCOUNTER — Other Ambulatory Visit: Payer: Self-pay | Admitting: *Deleted

## 2015-11-27 MED ORDER — PIOGLITAZONE HCL 30 MG PO TABS: 30.0000 mg | ORAL_TABLET | Freq: Every day | ORAL | Status: DC

## 2015-12-11 ENCOUNTER — Other Ambulatory Visit: Payer: Self-pay | Admitting: *Deleted

## 2015-12-11 MED ORDER — ALPRAZOLAM 0.5 MG PO TABS: 0.5000 mg | ORAL_TABLET | Freq: Two times a day (BID) | ORAL | Status: DC | PRN

## 2015-12-11 NOTE — Telephone Encounter (Signed)
Please call in.  Thanks.   

## 2015-12-11 NOTE — Telephone Encounter (Signed)
Last filled 11/07/15, last appt was an acute ov on 10/04/15, last cpx was 03/07/15. Pt has f/u with scheduled 01/02/16. Ok to refill?

## 2015-12-12 NOTE — Telephone Encounter (Signed)
Medication phoned to pharmacy.  

## 2015-12-16 ENCOUNTER — Encounter: Payer: Self-pay | Admitting: Family Medicine

## 2015-12-18 ENCOUNTER — Other Ambulatory Visit: Payer: Self-pay | Admitting: *Deleted

## 2015-12-18 NOTE — Telephone Encounter (Signed)
Faxed refill request. Last Filled:    60 tablet 2 12/11/2015

## 2015-12-24 ENCOUNTER — Other Ambulatory Visit: Payer: Self-pay | Admitting: Family Medicine

## 2015-12-24 DIAGNOSIS — E1140 Type 2 diabetes mellitus with diabetic neuropathy, unspecified: Secondary | ICD-10-CM

## 2015-12-28 ENCOUNTER — Other Ambulatory Visit (INDEPENDENT_AMBULATORY_CARE_PROVIDER_SITE_OTHER): Payer: PPO

## 2015-12-28 DIAGNOSIS — E1140 Type 2 diabetes mellitus with diabetic neuropathy, unspecified: Secondary | ICD-10-CM

## 2015-12-28 LAB — HEMOGLOBIN A1C: HEMOGLOBIN A1C: 8 % — AB (ref 4.6–6.5)

## 2016-01-02 ENCOUNTER — Encounter: Payer: Self-pay | Admitting: Family Medicine

## 2016-01-02 ENCOUNTER — Ambulatory Visit (INDEPENDENT_AMBULATORY_CARE_PROVIDER_SITE_OTHER): Payer: PPO | Admitting: Family Medicine

## 2016-01-02 VITALS — BP 130/80 | HR 87 | Temp 97.8°F | Wt 212.8 lb

## 2016-01-02 DIAGNOSIS — E1140 Type 2 diabetes mellitus with diabetic neuropathy, unspecified: Secondary | ICD-10-CM

## 2016-01-02 MED ORDER — INSULIN GLARGINE 100 UNIT/ML SOLOSTAR PEN: 28.0000 [IU] | PEN_INJECTOR | Freq: Every day | SUBCUTANEOUS | Status: DC

## 2016-01-02 NOTE — Progress Notes (Signed)
Pre visit review using our clinic review tool, if applicable. No additional management support is needed unless otherwise documented below in the visit note.  He has used oxycodone rarely.  Some occ ibuprofen use.  Overall less pain than prev.    Diabetes:  Using medications without difficulties:yes Hypoglycemic episodes:no Hyperglycemic episodes:no Feet problems: no Blood Sugars averaging: ~120s, occ some higher or lower.  2 hours after meals it was usually ~150 eye exam within last year: yes A1c down to 8, d/w pt.  Diet d/w pt.  "I'm a nitpicking eater."  D/w pt about trying to keep on a schedule with eating.  "I don't savor food like I used to."  D/w pt.    Meds, vitals, and allergies reviewed.   ROS: See HPI.  Otherwise negative.    GEN: nad, alert and oriented HEENT: mucous membranes moist NECK: supple w/o LA CV: rrr. PULM: ctab, no inc wob ABD: soft, +bs EXT: no edema  Diabetic foot exam: Normal inspection No skin breakdown Calluses noted on the distal B 1st MTs Normal DP pulses Normal sensation to light touch and monofilament Nails normal

## 2016-01-02 NOTE — Patient Instructions (Signed)
Recheck labs in about 3-4 months before a physical.  Take care.  Glad to see you.

## 2016-01-03 ENCOUNTER — Encounter: Payer: Self-pay | Admitting: Family Medicine

## 2016-01-03 NOTE — Assessment & Plan Note (Signed)
A1c down to 8, d/w pt.  Diet d/w pt.  "I'm a nitpicking eater."  D/w pt about trying to keep on a schedule with eating.  "I don't savor food like I used to."  Recheck labs in about 3-4 months before a physical.  He'll work on diet more than med change at this point.  He agrees.

## 2016-01-10 ENCOUNTER — Encounter: Payer: Self-pay | Admitting: Family Medicine

## 2016-01-11 DIAGNOSIS — Z08 Encounter for follow-up examination after completed treatment for malignant neoplasm: Secondary | ICD-10-CM | POA: Diagnosis not present

## 2016-01-11 DIAGNOSIS — D485 Neoplasm of uncertain behavior of skin: Secondary | ICD-10-CM | POA: Diagnosis not present

## 2016-01-11 DIAGNOSIS — L570 Actinic keratosis: Secondary | ICD-10-CM | POA: Diagnosis not present

## 2016-01-11 DIAGNOSIS — Z85828 Personal history of other malignant neoplasm of skin: Secondary | ICD-10-CM | POA: Diagnosis not present

## 2016-01-11 DIAGNOSIS — D0439 Carcinoma in situ of skin of other parts of face: Secondary | ICD-10-CM | POA: Diagnosis not present

## 2016-01-11 DIAGNOSIS — X32XXXA Exposure to sunlight, initial encounter: Secondary | ICD-10-CM | POA: Diagnosis not present

## 2016-01-17 ENCOUNTER — Telehealth: Payer: Self-pay | Admitting: Family Medicine

## 2016-01-17 ENCOUNTER — Encounter: Payer: Self-pay | Admitting: Family Medicine

## 2016-01-17 NOTE — Telephone Encounter (Signed)
Noted  

## 2016-01-17 NOTE — Telephone Encounter (Signed)
Patient Name: MAHMOOD SOKOLOW DOB: Feb 01, 1946 Initial Comment Caller states c/o insect bite on arm, arm is red, swollen and blistered Nurse Assessment Nurse: Vallery Sa, RN, Cathy Date/Time (Eastern Time): 01/17/2016 10:12:57 AM Confirm and document reason for call. If symptomatic, describe symptoms. You must click the next button to save text entered. ---Edd Arbour states he got an unknown insect bit to his forearm two days ago that is more inflamed today. No severe breathing or swallowing difficulty. No fever. Has the patient traveled out of the country within the last 30 days? ---No Does the patient have any new or worsening symptoms? ---Yes Will a triage be completed? ---Yes Related visit to physician within the last 2 weeks? ---No Does the PT have any chronic conditions? (i.e. diabetes, asthma, etc.) ---Yes List chronic conditions. ---Diabetes, Back and neck problems Is this a behavioral health or substance abuse call? ---No Guidelines Guideline Title Affirmed Question Affirmed Notes Insect Bite [1] Red streak or red line AND [2] length > 2 inches (5 cm) Final Disposition User See Physician within 4 Hours (or PCP triage) Vallery Sa, RN, Cathy Comments Unable to schedule an appointment. He will go to urgent care facility in their area. Referrals GO TO FACILITY OTHER - SPECIFY Disagree/Comply: Comply

## 2016-01-21 MED ORDER — INSULIN GLARGINE 100 UNIT/ML SOLOSTAR PEN: 28.0000 [IU] | PEN_INJECTOR | Freq: Every day | SUBCUTANEOUS | Status: DC

## 2016-01-21 NOTE — Telephone Encounter (Signed)
Refilled

## 2016-01-26 DIAGNOSIS — D0439 Carcinoma in situ of skin of other parts of face: Secondary | ICD-10-CM | POA: Diagnosis not present

## 2016-01-26 DIAGNOSIS — R208 Other disturbances of skin sensation: Secondary | ICD-10-CM | POA: Diagnosis not present

## 2016-01-26 DIAGNOSIS — D485 Neoplasm of uncertain behavior of skin: Secondary | ICD-10-CM | POA: Diagnosis not present

## 2016-01-26 DIAGNOSIS — C44729 Squamous cell carcinoma of skin of left lower limb, including hip: Secondary | ICD-10-CM | POA: Diagnosis not present

## 2016-02-23 DIAGNOSIS — D0439 Carcinoma in situ of skin of other parts of face: Secondary | ICD-10-CM | POA: Diagnosis not present

## 2016-03-01 ENCOUNTER — Other Ambulatory Visit: Payer: Self-pay | Admitting: Family Medicine

## 2016-03-08 ENCOUNTER — Other Ambulatory Visit: Payer: Self-pay

## 2016-03-08 MED ORDER — OXYCODONE-ACETAMINOPHEN 5-325 MG PO TABS: 1.0000 | ORAL_TABLET | Freq: Four times a day (QID) | ORAL | Status: DC | PRN

## 2016-03-08 NOTE — Telephone Encounter (Signed)
Pt left v/m requesting rx oxycodone apap. Call when ready for pick up. rx last printed # 60 on 09/12/15. Pt last seen 01/02/16.

## 2016-03-08 NOTE — Telephone Encounter (Signed)
Printed.  Thanks.  

## 2016-03-08 NOTE — Telephone Encounter (Signed)
Left detailed message on voicemail. Rx left at front desk for pick up.  

## 2016-03-12 ENCOUNTER — Other Ambulatory Visit: Payer: Self-pay | Admitting: Family Medicine

## 2016-03-12 DIAGNOSIS — E1140 Type 2 diabetes mellitus with diabetic neuropathy, unspecified: Secondary | ICD-10-CM

## 2016-03-14 ENCOUNTER — Other Ambulatory Visit (INDEPENDENT_AMBULATORY_CARE_PROVIDER_SITE_OTHER): Payer: PPO

## 2016-03-14 ENCOUNTER — Ambulatory Visit (INDEPENDENT_AMBULATORY_CARE_PROVIDER_SITE_OTHER): Payer: PPO

## 2016-03-14 VITALS — BP 124/80 | HR 85 | Temp 97.6°F | Ht 69.0 in | Wt 206.0 lb

## 2016-03-14 DIAGNOSIS — C44729 Squamous cell carcinoma of skin of left lower limb, including hip: Secondary | ICD-10-CM | POA: Diagnosis not present

## 2016-03-14 DIAGNOSIS — E1140 Type 2 diabetes mellitus with diabetic neuropathy, unspecified: Secondary | ICD-10-CM | POA: Diagnosis not present

## 2016-03-14 DIAGNOSIS — Z0000 Encounter for general adult medical examination without abnormal findings: Secondary | ICD-10-CM

## 2016-03-14 DIAGNOSIS — L905 Scar conditions and fibrosis of skin: Secondary | ICD-10-CM | POA: Diagnosis not present

## 2016-03-14 DIAGNOSIS — Z79899 Other long term (current) drug therapy: Secondary | ICD-10-CM | POA: Diagnosis not present

## 2016-03-14 DIAGNOSIS — Z79891 Long term (current) use of opiate analgesic: Secondary | ICD-10-CM | POA: Diagnosis not present

## 2016-03-14 LAB — COMPREHENSIVE METABOLIC PANEL
ALK PHOS: 62 U/L (ref 39–117)
ALT: 22 U/L (ref 0–53)
AST: 21 U/L (ref 0–37)
Albumin: 4.2 g/dL (ref 3.5–5.2)
BILIRUBIN TOTAL: 0.7 mg/dL (ref 0.2–1.2)
BUN: 14 mg/dL (ref 6–23)
CALCIUM: 9.5 mg/dL (ref 8.4–10.5)
CO2: 28 meq/L (ref 19–32)
Chloride: 103 mEq/L (ref 96–112)
Creatinine, Ser: 1.05 mg/dL (ref 0.40–1.50)
GFR: 74.37 mL/min (ref >60.00)
GLUCOSE: 173 mg/dL — AB (ref 70–99)
POTASSIUM: 4.1 meq/L (ref 3.5–5.1)
Sodium: 139 mEq/L (ref 135–145)
TOTAL PROTEIN: 6.8 g/dL (ref 6.0–8.3)

## 2016-03-14 LAB — LIPID PANEL
CHOL/HDL RATIO: 5
Cholesterol: 210 mg/dL — ABNORMAL HIGH (ref 0–200)
HDL: 42.8 mg/dL (ref >39.00)
LDL Cholesterol: 143 mg/dL — ABNORMAL HIGH (ref 0–99)
NONHDL: 166.92
TRIGLYCERIDES: 121 mg/dL (ref 0.0–149.0)
VLDL: 24.2 mg/dL (ref 0.0–40.0)

## 2016-03-14 LAB — HEMOGLOBIN A1C: Hgb A1c MFr Bld: 7.9 % — ABNORMAL HIGH (ref 4.6–6.5)

## 2016-03-14 NOTE — Patient Instructions (Signed)
[name] , Thank you for taking time to come for your Medicare Wellness Visit. I appreciate your ongoing commitment to your health goals. Please review the following plan we discussed and let me know if I can assist you in the future.   These are the goals we discussed: Goals    Starting 03/14/2016, I will continue to take medications as prescribed in an effort to manage my health conditions.       This is a list of the screening recommended for you and due dates:  Health Maintenance  Topic Date Due  . Flu Shot  04/09/2016  . Eye exam for diabetics  06/01/2016  . Hemoglobin A1C  06/28/2016  . Tetanus Vaccine  11/23/2016  . Complete foot exam   01/01/2017  . Colon Cancer Screening  04/14/2022  . Shingles Vaccine  Completed  .  Hepatitis C: One time screening is recommended by Center for Disease Control  (CDC) for  adults born from [birth year] through 1965.   Completed  . Pneumonia vaccines  Completed    Preventive Care for Adults  A healthy lifestyle and preventive care can promote health and wellness. Preventive health guidelines for adults include the following key practices.  . A routine yearly physical is a good way to check with your health care provider about your health and preventive screening. It is a chance to share any concerns and updates on your health and to receive a thorough exam.  . Visit your dentist for a routine exam and preventive care every 6 months. Brush your teeth twice a day and floss once a day. Good oral hygiene prevents tooth decay and gum disease.  . The frequency of eye exams is based on your age, health, family medical history, use  of contact lenses, and other factors. Follow your health care provider's ecommendations for frequency of eye exams.  . Eat a healthy diet. Foods like vegetables, fruits, whole grains, low-fat dairy products, and lean protein foods contain the nutrients you need without too many calories. Decrease your intake of foods high in solid  fats, added sugars, and salt. Eat the right amount of calories for you. Get information about a proper diet from your health care provider, if necessary.  . Regular physical exercise is one of the most important things you can do for your health. Most adults should get at least 150 minutes of moderate-intensity exercise (any activity that increases your heart rate and causes you to sweat) each week. In addition, most adults need muscle-strengthening exercises on 2 or more days a week.  Silver Sneakers may be a benefit available to you. To determine eligibility, you may visit the website: www.silversneakers.com or contact program at 307 [phone] Mon-Fri between 8AM-8PM.   . Maintain a healthy weight. The body mass index (BMI) is a screening tool to identify possible weight problems. It provides an estimate of body fat based on height and weight. Your health care provider can find your BMI and can help you achieve or maintain a healthy weight.   For adults 20 years and older: ? A BMI below 18.5 is considered underweight. ? A BMI of 18.5 to 24.9 is normal. ? A BMI of 25 to 29.9 is considered overweight. ? A BMI of 30 and above is considered obese.   . Maintain normal blood lipids and cholesterol levels by exercising and minimizing your intake of saturated fat. Eat a balanced diet with plenty of fruit and vegetables. Blood tests for lipids and cholesterol should  begin at age 29 and be repeated every 5 years. If your lipid or cholesterol levels are high, you are over 50, or you are at high risk for heart disease, you may need your cholesterol levels checked more frequently. Ongoing high lipid and cholesterol levels should be treated with medicines if diet and exercise are not working.  . If you smoke, find out from your health care provider how to quit. If you do not use tobacco, please do not start.  . If you choose to drink alcohol, please do not consume more than 2 drinks per day. One drink is  considered to be 12 ounces (355 mL) of beer, 5 ounces (148 mL) of wine, or 1.5 ounces (44 mL) of liquor.  . If you are 16-11 years old, ask your health care provider if you should take aspirin to prevent strokes.  . Use sunscreen. Apply sunscreen liberally and repeatedly throughout the day. You should seek shade when your shadow is shorter than you. Protect yourself by wearing long sleeves, pants, a wide-brimmed hat, and sunglasses year round, whenever you are outdoors.  . Once a month, do a whole body skin exam, using a mirror to look at the skin on your back. Tell your health care provider of new moles, moles that have irregular borders, moles that are larger than a pencil eraser, or moles that have changed in shape or color.

## 2016-03-14 NOTE — Progress Notes (Signed)
PCP notes:   Health maintenance: No gaps identified or addressed  Abnormal screenings: None  Patient concerns: Pt is having skin surgery today on left lower calf.  Nurse concerns: Pt's gait is unsteady. Pt has assistive devices but does not use any of them.   Next PCP appt: 03/19/16 @ 0945  I reviewed health advisor's note, was available for consultation on the day of service listed in this note, and agree with documentation and plan. Elsie Stain, MD.

## 2016-03-14 NOTE — Progress Notes (Signed)
Pre visit review using our clinic review tool, if applicable. No additional management support is needed unless otherwise documented below in the visit note. 

## 2016-03-14 NOTE — Progress Notes (Signed)
Subjective:   Walter Harris is a 70 y.o. male who presents for Medicare Annual/Subsequent preventive examination.  Review of Systems:  N/A Cardiac Risk Factors include: advanced age (>36men, >64 women);diabetes mellitus;male gender;dyslipidemia;hypertension;sedentary lifestyle     Objective:    Vitals: BP 124/80 mmHg  Pulse 85  Temp(Src) 97.6 F (36.4 C) (Oral)  Ht 5\' 9"  (1.753 m)  Wt 206 lb (93.441 kg)  BMI 30.41 kg/m2  SpO2 95%  Body mass index is 30.41 kg/(m^2).  Tobacco History  Smoking status  . Current Some Day Smoker -- 0.50 packs/day for 40 years  . Types: Pipe  . Last Attempt to Quit: 09/10/2003  Smokeless tobacco  . Never Used     Ready to quit: No Counseling given: No   Past Medical History  Diagnosis Date  . Diabetes mellitus   . Hyperlipidemia   . History of colonic polyps   . OCD (obsessive compulsive disorder)   . Stuttering   . Tremor, essential   . 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 DJD (degenerative joint disease)     lumbar spine  . Basal cell carcinoma     multiple  . Wears glasses   . Hearing impaired     hearing aids-transmitter rt   . Kidney stone 1967  . History of blood transfusion     as a baby in 58  . Cataracts, both eyes   . Hypertension     pt. denies high blood pressure  . Nose fracture     3x  . Complication of anesthesia     post-operative cognitive dysfunction after ACDF on 05/01/11 [facility])  . Stroke [facility])     "mini stroke, that's what gave me the tremors"   Past Surgical History  Procedure Laterality Date  . Cervical fusion  1990  . Cholesteatoma excision  1993  . Other surgical history      benign head tumor#5  . Other surgical history      sebaceous cysts-post neck x5  . Appendectomy  1960  . External ear surgery      multiple ear surguries as a child  . Tonsillectomy    . Collateral ligament repair, elbow      bilateral, nerve improvement left 08/04, right 09/04  . Shoulder arthroscopy w/  acromial repair  08/2004    left shoulder  . Cervical fusion  8/12    multiple levels with plates  . Ulnar nerve transposition Right 05/13/2013    Procedure: RIGHT ULNAR NERVE DECOMPRESSION/TRANSPOSITION;  Surgeon: [name]., MD;  Location: [place];  Service: Orthopedics;  Laterality: Right;  . Back surgery  2012    lumbar surgery  . Colonoscopy    . Cyst excision      back of cervical spine total of 5 surgeries   Family History  Problem Relation Age of Onset  . OCD Mother   . Bipolar disorder Mother   . Cancer Mother   . 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 OCD Sister   . OCD Sister   . Colon cancer Neg Hx   . Prostate cancer Neg Hx    History  Sexual Activity  . Sexual Activity: No    Outpatient Encounter Prescriptions as of 03/14/2016  Medication Sig  . ALPRAZolam (XANAX) 0.5 MG tablet Take 1 tablet (0.5 mg total) by mouth 2 (two) times daily as needed.  Marland Kitchen  canagliflozin (INVOKANA) 300 MG TABS tablet Take 150-300 mg by mouth daily before breakfast. Increase to a whole tab if sugar is consistently elevated  . carisoprodol (SOMA) 350 MG tablet Take 1 tablet (350 mg total) by mouth 3 (three) times daily as needed for muscle spasms.  Marland Kitchen gabapentin (NEURONTIN) 300 MG capsule Take 2 tabs twice a day, with a extra tab per day if needed for pain (up to 5 pills a day)  . glucose blood test strip Use as instructed to test blood sugar 4 times daily, dx: 250.02  . Insulin Glargine (LANTUS SOLOSTAR) 100 UNIT/ML Solostar Pen Inject 28 Units into the skin daily. QS 1 month  . Insulin Pen Needle 31G X 8 MM MISC Use daily with insulin injection  . metFORMIN (GLUCOPHAGE) 500 MG tablet Take 2 tablets (1,000 mg total) by mouth 2 (two) times daily with a meal.  . oxyCODONE-acetaminophen (PERCOCET/ROXICET) 5-325 MG tablet Take 1 tablet by mouth every 6 (six) hours as needed (pain).  . pioglitazone (ACTOS) 30 MG tablet Take 1 tablet (30 mg total) by  mouth daily after supper.  . propranolol (INDERAL) 10 MG tablet TAKE TWO TABLETS EVERY MORNING TAKE ONE TABLET EACH EVENING  . traZODone (DESYREL) 50 MG tablet Take 50 mg by mouth at bedtime as needed for sleep. Reported on 03/14/2016  . [DISCONTINUED] EPINEPHrine 0.3 mg/0.3 mL IJ SOAJ injection Inject 0.3 mLs (0.3 mg total) into the muscle once.   No facility-administered encounter medications on file as of 03/14/2016.    Activities of Daily Living In your present state of health, do you have any difficulty performing the following activities: 03/14/2016  Hearing? Y  Vision? N  Difficulty concentrating or making decisions? N  Walking or climbing stairs? N  Dressing or bathing? N  Doing errands, shopping? N  Preparing Food and eating ? N  Using the Toilet? N  In the past six months, have you accidently leaked urine? N  Do you have problems with loss of bowel control? N  Managing your Medications? N  Managing your Finances? N  Housekeeping or managing your Housekeeping? N    Patient Care Team: [name], MD as PCP - General (Family Medicine) Kary Kos, MD as Consulting Physician (Neurosurgery) Estill Cotta, MD as Referring Physician (Ophthalmology) Oneta Rack, MD as Referring Physician (Dermatology)   Assessment:     Hearing Screening Comments: Wears bilateral hearing aids Vision Screening Comments: Last vision exam in 2016 Exercise Activities and Dietary recommendations Current Exercise Habits: The patient does not participate in regular exercise at present, Exercise limited by: orthopedic condition(s);neurologic condition(s)  Goals    . medication adherence     Starting 03/14/16, I will continue to take medications as prescribed in an effort to manage my health conditions.       Fall Risk Fall Risk  03/14/2016 03/07/2015  Falls in the past year? No No   Depression Screen PHQ 2/9 Scores 03/14/2016 03/07/2015  PHQ - 2 Score 0 1    Cognitive Testing MMSE - Mini  Mental State Exam 03/14/2016  Orientation to time 5  Orientation to Place 5  Registration 3  Attention/ Calculation 0  Recall 3  Language- name 2 objects 0  Language- repeat 1  Language- follow 3 step command 0  Language- follow 3 step command-comments due to hx of stroke, pt is unable to write  Language- read & follow direction 0  Write a sentence 0  Copy design 0  Total score 17  PLEASE NOTE: A Mini-Cog screen was completed. Maximum score is 20. A value of 0 denotes this part of Folstein MMSE was not completed or the patient failed this part of the Mini-Cog screening.   Mini-Cog Screening Orientation to Time - Max 5 pts Orientation to Place - Max 5 pts Registration - Max 3 pts Recall - Max 3 pts Language Repeat - Max 1 pts Language Follow 3 Step Command - Max 3 pts   Immunization History  Administered Date(s) Administered  . Influenza Split 07/10/2011, 05/28/2012  . Influenza Whole 06/08/2008, 05/29/2009, 05/22/2010  . Influenza-Unspecified 06/30/2015  . Pneumococcal Conjugate-13 09/09/2014  . Pneumococcal Polysaccharide-23 02/19/2010, 01/03/2016  . Td 11/24/2006  . Zoster 12/22/2009   Screening Tests Health Maintenance  Topic Date Due  . INFLUENZA VACCINE  04/09/2016  . OPHTHALMOLOGY EXAM  06/01/2016  . HEMOGLOBIN A1C  06/28/2016  . TETANUS/TDAP  11/23/2016  . FOOT EXAM  01/01/2017  . COLONOSCOPY  04/14/2022  . ZOSTAVAX  Completed  . Hepatitis C Screening  Completed  . PNA vac Low Risk Adult  Completed      Plan:     I have personally reviewed and addressed the Medicare Annual Wellness questionnaire and have noted the following in the patient's chart:  A. Medical and social history B. Use of alcohol, tobacco or illicit drugs  C. Current medications and supplements D. Functional ability and status E.  Nutritional status F.  Physical activity G. Advance directives H. List of other physicians I.  Hospitalizations, surgeries, and ER visits in previous 12  months J.  [place] to include hearing, vision, cognitive, depression L. Referrals and appointments - none  In addition, I have reviewed and discussed with patient certain preventive protocols, quality metrics, and best practice recommendations. A written personalized care plan for preventive services as well as general preventive health recommendations were provided to patient.  See attached scanned questionnaire for additional information.   Signed,   Lindell Noe, MHA, BS, LPN Health Advisor

## 2016-03-19 ENCOUNTER — Ambulatory Visit (INDEPENDENT_AMBULATORY_CARE_PROVIDER_SITE_OTHER): Payer: PPO | Admitting: Family Medicine

## 2016-03-19 ENCOUNTER — Encounter: Payer: Self-pay | Admitting: Family Medicine

## 2016-03-19 VITALS — BP 142/76 | HR 86 | Temp 97.9°F | Wt 208.2 lb

## 2016-03-19 DIAGNOSIS — E785 Hyperlipidemia, unspecified: Secondary | ICD-10-CM

## 2016-03-19 DIAGNOSIS — E1140 Type 2 diabetes mellitus with diabetic neuropathy, unspecified: Secondary | ICD-10-CM

## 2016-03-19 DIAGNOSIS — M549 Dorsalgia, unspecified: Secondary | ICD-10-CM

## 2016-03-19 DIAGNOSIS — G8929 Other chronic pain: Secondary | ICD-10-CM

## 2016-03-19 LAB — HM DIABETES FOOT EXAM

## 2016-03-19 MED ORDER — METFORMIN HCL 500 MG PO TABS: 1000.0000 mg | ORAL_TABLET | Freq: Two times a day (BID) | ORAL | Status: DC

## 2016-03-19 MED ORDER — CANAGLIFLOZIN 300 MG PO TABS: ORAL_TABLET | ORAL | Status: DC

## 2016-03-19 NOTE — Progress Notes (Signed)
Pre visit review using our clinic review tool, if applicable. No additional management support is needed unless otherwise documented below in the visit note.  His chronic and ongoing pain is some better on tumeric.  D/w pt.  Still on baseline meds but needing oxycodone less in the meantime.  His foot pain and tingling is better on gabapentin.    Sutures on the L calf from derm clinic excision, he'll f/u with them re: path and suture removal.    Prostate cancer screening and PSA options (with potential risks and benefits of testing vs not testing) were discussed along with recent recs/guidelines.  He declined testing PSA at this point.  HLD.  Statin intolerant.  Labs d/w pt.    Living will d/w pt.  Wife designated if patient were incapacitated.    D/w pt about fall risk.  Routine cautions d/w pt.  He has hand rails up at home.    Diabetes:  Using medications without difficulties:yes Hypoglycemic episodes:no.  He prev used to have lows in the PM but not recently Hyperglycemic episodes:no Feet problems:tingling improved with gabapentin Blood Sugars averaging: ~100 in the AMs.  eye exam within last year: yes Would need work on weight more than med changes.  D/w pt.    PMH and SH reviewed  Meds, vitals, and allergies reviewed.   ROS: Per HPI unless specifically indicated in ROS section   GEN: nad, alert and oriented HEENT: mucous membranes moist NECK: supple w/o LA CV: rrr. PULM: ctab, no inc wob ABD: soft, +bs EXT: no edema SKIN: no acute rash, clean appearing sutures on the L calf   Diabetic foot exam: Normal inspection No skin breakdown No calluses  Normal DP pulses Normal sensation to light touch and monofilament Nails normal

## 2016-03-19 NOTE — Patient Instructions (Signed)
Take care.  Glad to see you.  Don't change your meds for now.  Recheck in about 4 months, labs before a visit.  Update me as needed.

## 2016-03-20 NOTE — Assessment & Plan Note (Signed)
Improved with tumeric, continue baseline meds o/w.  He'll update me about dose of tumeric.  No ade on current meds.

## 2016-03-20 NOTE — Assessment & Plan Note (Addendum)
D/w pt about labs.  Would need work on weight more than med changes. He agrees.  A1c goal 7.5 likely reasonable to try to prevent lows.  >25 minutes spent in face to face time with patient, >50% spent in counselling or coordination of care.

## 2016-03-20 NOTE — Assessment & Plan Note (Signed)
HLD.  Statin intolerant.  Labs d/w pt. Would continue as best he can with diet and exercise.

## 2016-03-28 ENCOUNTER — Encounter: Payer: Self-pay | Admitting: Family Medicine

## 2016-03-28 ENCOUNTER — Ambulatory Visit (INDEPENDENT_AMBULATORY_CARE_PROVIDER_SITE_OTHER): Payer: PPO | Admitting: Family Medicine

## 2016-03-28 VITALS — BP 110/80 | HR 107 | Temp 97.6°F | Wt 205.0 lb

## 2016-03-28 DIAGNOSIS — R300 Dysuria: Secondary | ICD-10-CM

## 2016-03-28 LAB — POC URINALSYSI DIPSTICK (AUTOMATED)
Bilirubin, UA: NEGATIVE
Blood, UA: NEGATIVE
Ketones, UA: NEGATIVE
Leukocytes, UA: NEGATIVE
NITRITE UA: NEGATIVE
Protein, UA: NEGATIVE
UROBILINOGEN UA: 4
pH, UA: 6

## 2016-03-28 MED ORDER — CEPHALEXIN 500 MG PO CAPS: 500.0000 mg | ORAL_CAPSULE | Freq: Four times a day (QID) | ORAL | Status: DC

## 2016-03-28 MED ORDER — CEPHALEXIN 500 MG PO CAPS: 500.0000 mg | ORAL_CAPSULE | Freq: Two times a day (BID) | ORAL | Status: DC

## 2016-03-28 NOTE — Progress Notes (Signed)
Pre visit review using our clinic review tool, if applicable. No additional management support is needed unless otherwise documented below in the visit note.  He had some chills initially, a few days ago.  Then that resolved.  Now with burning with urination for the last 2 days.  His sx are improved today.  Taking cranberry juice.  No fevers, no vomiting.  Some diarrhea.  Sugar was 115 this AM, that is similar to prev readings.    Meds, vitals, and allergies reviewed.   ROS: Per HPI unless specifically indicated in ROS section   GEN: nad, alert and oriented HEENT: mucous membranes moist NECK: supple w/o LA CV: rrr. PULM: ctab, no inc wob ABD: soft, +bs EXT: no edema SKIN: no acute rash  U/a d/w pt.

## 2016-03-28 NOTE — Patient Instructions (Signed)
Drink plenty of water and start the antibiotics today.  We'll contact you with your lab report.  Take care.  Glad to see you.  

## 2016-03-29 DIAGNOSIS — R300 Dysuria: Secondary | ICD-10-CM | POA: Insufficient documentation

## 2016-03-29 NOTE — Assessment & Plan Note (Signed)
Could be DM2/invokana related.  Could still represent UTI.  D/w pt.  U/a with sugar noted as expected.  Start abx in meantime and check ucx.  He agrees.  Nontoxic. Okay for outpatient f/u.

## 2016-03-30 ENCOUNTER — Encounter: Payer: Self-pay | Admitting: Family Medicine

## 2016-03-30 LAB — URINE CULTURE: Colony Count: >=100000

## 2016-04-01 ENCOUNTER — Telehealth: Payer: Self-pay | Admitting: Family Medicine

## 2016-04-01 NOTE — Telephone Encounter (Signed)
See mychart message.  Please get update on patient.  I checked my messages Friday night but this didn't come in by that point.  How is he doing now?  Please let me know.  If still with diarrhea, then needs to stop keflex and drink plenty of water.  Let me know.  Thanks.

## 2016-04-01 NOTE — Telephone Encounter (Signed)
Patient says he is feeling much better now.  Patient did not discontinue the medication so there is reason to believe that this incident may have been something other than the abx.

## 2016-04-01 NOTE — Telephone Encounter (Signed)
If he is feeling better, then I would finish the abx and f/u prn.  Thanks.  Glad he is feeling better.  I think this was unrelated.

## 2016-04-02 ENCOUNTER — Encounter: Payer: Self-pay | Admitting: Family Medicine

## 2016-04-08 ENCOUNTER — Encounter: Payer: Self-pay | Admitting: Family Medicine

## 2016-04-09 ENCOUNTER — Encounter: Payer: Self-pay | Admitting: Family Medicine

## 2016-04-10 ENCOUNTER — Encounter: Payer: Self-pay | Admitting: Family Medicine

## 2016-04-10 ENCOUNTER — Other Ambulatory Visit: Payer: Self-pay | Admitting: Family Medicine

## 2016-04-10 MED ORDER — AMOXICILLIN 875 MG PO TABS: 875.0000 mg | ORAL_TABLET | Freq: Two times a day (BID) | ORAL | 0 refills | Status: AC

## 2016-04-14 ENCOUNTER — Other Ambulatory Visit: Payer: Self-pay | Admitting: Family Medicine

## 2016-04-14 ENCOUNTER — Encounter: Payer: Self-pay | Admitting: Family Medicine

## 2016-04-14 MED ORDER — INSULIN GLARGINE 100 UNIT/ML SOLOSTAR PEN: 32.0000 [IU] | PEN_INJECTOR | Freq: Every day | SUBCUTANEOUS | 6 refills | Status: DC

## 2016-04-16 ENCOUNTER — Other Ambulatory Visit: Payer: Self-pay | Admitting: Family Medicine

## 2016-04-16 ENCOUNTER — Encounter: Payer: Self-pay | Admitting: Family Medicine

## 2016-04-17 NOTE — Telephone Encounter (Signed)
Sent. Thanks.   

## 2016-04-22 ENCOUNTER — Telehealth: Payer: Self-pay | Admitting: Family Medicine

## 2016-04-22 MED ORDER — ALPRAZOLAM 0.5 MG PO TABS: 0.5000 mg | ORAL_TABLET | Freq: Two times a day (BID) | ORAL | 2 refills | Status: DC | PRN

## 2016-04-22 NOTE — Telephone Encounter (Signed)
Please call in.  I don't see any recent request for this rx.  Thanks.

## 2016-04-22 NOTE — Telephone Encounter (Signed)
I don't see a request prior to this either.  Medication phoned to pharmacy.

## 2016-04-22 NOTE — Telephone Encounter (Signed)
Patient called and asked for his Xanax to be refilled with 2 refills.  Patient said he called the pharmacy and they said they have not had a response from our office.  Patient has been out of his medication since Friday.  [facility]  Please call patient when rx is called in to pharmacy.

## 2016-05-02 ENCOUNTER — Encounter: Payer: Self-pay | Admitting: Family Medicine

## 2016-05-17 ENCOUNTER — Other Ambulatory Visit: Payer: Self-pay | Admitting: Family Medicine

## 2016-05-17 NOTE — Telephone Encounter (Addendum)
MyChart RF request.  Last Filled:    60 tablet 0 03/08/2016   Last office visit:   03/19/16 CPE  Please advise.

## 2016-05-19 MED ORDER — OXYCODONE-ACETAMINOPHEN 5-325 MG PO TABS: 1.0000 | ORAL_TABLET | Freq: Four times a day (QID) | ORAL | 0 refills | Status: DC | PRN

## 2016-05-19 NOTE — Telephone Encounter (Signed)
Printed.  Thanks.  

## 2016-05-20 NOTE — Telephone Encounter (Signed)
Patient notified that script is up front ready for pickup.

## 2016-05-28 ENCOUNTER — Encounter: Payer: Self-pay | Admitting: Family Medicine

## 2016-05-28 ENCOUNTER — Ambulatory Visit (INDEPENDENT_AMBULATORY_CARE_PROVIDER_SITE_OTHER): Payer: PPO | Admitting: Family Medicine

## 2016-05-28 DIAGNOSIS — J0100 Acute maxillary sinusitis, unspecified: Secondary | ICD-10-CM

## 2016-05-28 MED ORDER — AMOXICILLIN-POT CLAVULANATE 875-125 MG PO TABS: 1.0000 | ORAL_TABLET | Freq: Two times a day (BID) | ORAL | 0 refills | Status: DC

## 2016-05-28 MED ORDER — BENZONATATE 200 MG PO CAPS: 200.0000 mg | ORAL_CAPSULE | Freq: Three times a day (TID) | ORAL | 0 refills | Status: DC | PRN

## 2016-05-28 MED ORDER — TRAZODONE HCL 50 MG PO TABS: 50.0000 mg | ORAL_TABLET | Freq: Every evening | ORAL | 1 refills | Status: DC | PRN

## 2016-05-28 NOTE — Progress Notes (Signed)
Pre visit review using our clinic review tool, if applicable. No additional management support is needed unless otherwise documented below in the visit note. 

## 2016-05-28 NOTE — Patient Instructions (Signed)
Tessalon for cough.  Start augmentin for likely sinus infection.  Take care.  Glad to see you.  Update me as needed.

## 2016-05-28 NOTE — Progress Notes (Signed)
ST.  Can't sleep from coughing.  Sore from coughing.  Still getting worse.  Sick for about 5 days.  Cough with sputum, worse at night.  No fevers.  Some wheeze.  Sick contact noted.    Meds, vitals, and allergies reviewed.   ROS: Per HPI unless specifically indicated in ROS section   GEN: nad, alert and oriented HEENT: mucous membranes moist, tm w/o erythema, nasal exam w/o erythema, clear discharge noted,  OP with cobblestoning, B max sinuses ttp. R TM with PE tube noted, intact appearing. NECK: supple w/o LA CV: rrr.   PULM: ctab, no inc wob EXT: no edema SKIN: no acute rash

## 2016-05-29 DIAGNOSIS — J0100 Acute maxillary sinusitis, unspecified: Secondary | ICD-10-CM | POA: Insufficient documentation

## 2016-05-29 NOTE — Assessment & Plan Note (Signed)
Tessalon for cough.  Start augmentin.  Nontoxic. Follow-up as needed. See after visit summary. He agrees.

## 2016-06-07 DIAGNOSIS — E119 Type 2 diabetes mellitus without complications: Secondary | ICD-10-CM | POA: Diagnosis not present

## 2016-06-07 LAB — HM DIABETES EYE EXAM

## 2016-06-11 ENCOUNTER — Encounter: Payer: Self-pay | Admitting: Family Medicine

## 2016-06-25 ENCOUNTER — Encounter: Payer: Self-pay | Admitting: Family Medicine

## 2016-07-11 ENCOUNTER — Encounter: Payer: Self-pay | Admitting: Family Medicine

## 2016-07-19 ENCOUNTER — Encounter: Payer: Self-pay | Admitting: Family Medicine

## 2016-07-22 ENCOUNTER — Encounter: Payer: Self-pay | Admitting: Family Medicine

## 2016-07-22 ENCOUNTER — Ambulatory Visit (INDEPENDENT_AMBULATORY_CARE_PROVIDER_SITE_OTHER): Payer: PPO | Admitting: Family Medicine

## 2016-07-22 VITALS — BP 130/90 | HR 80 | Temp 98.1°F | Wt 199.0 lb

## 2016-07-22 DIAGNOSIS — R109 Unspecified abdominal pain: Secondary | ICD-10-CM | POA: Diagnosis not present

## 2016-07-22 DIAGNOSIS — F411 Generalized anxiety disorder: Secondary | ICD-10-CM

## 2016-07-22 DIAGNOSIS — E1140 Type 2 diabetes mellitus with diabetic neuropathy, unspecified: Secondary | ICD-10-CM

## 2016-07-22 DIAGNOSIS — E119 Type 2 diabetes mellitus without complications: Secondary | ICD-10-CM | POA: Diagnosis not present

## 2016-07-22 DIAGNOSIS — G8929 Other chronic pain: Secondary | ICD-10-CM | POA: Diagnosis not present

## 2016-07-22 DIAGNOSIS — M549 Dorsalgia, unspecified: Secondary | ICD-10-CM

## 2016-07-22 LAB — POC URINALSYSI DIPSTICK (AUTOMATED)
Bilirubin, UA: NEGATIVE
Blood, UA: NEGATIVE
Ketones, UA: NEGATIVE
LEUKOCYTES UA: NEGATIVE
Nitrite, UA: NEGATIVE
PH UA: 6
PROTEIN UA: NEGATIVE
SPEC GRAV UA: 1.02
UROBILINOGEN UA: 0.2

## 2016-07-22 LAB — BASIC METABOLIC PANEL
BUN: 12 mg/dL (ref 6–23)
CALCIUM: 9.4 mg/dL (ref 8.4–10.5)
CHLORIDE: 104 meq/L (ref 96–112)
CO2: 29 meq/L (ref 19–32)
Creatinine, Ser: 1.04 mg/dL (ref 0.40–1.50)
GFR: 75.12 mL/min (ref >60.00)
Glucose, Bld: 95 mg/dL (ref 70–99)
Potassium: 4.5 mEq/L (ref 3.5–5.1)
SODIUM: 140 meq/L (ref 135–145)

## 2016-07-22 LAB — HEMOGLOBIN A1C: HEMOGLOBIN A1C: 7.2 % — AB (ref 4.6–6.5)

## 2016-07-22 MED ORDER — OXYCODONE-ACETAMINOPHEN 5-325 MG PO TABS: 1.0000 | ORAL_TABLET | Freq: Four times a day (QID) | ORAL | 0 refills | Status: DC | PRN

## 2016-07-22 MED ORDER — METFORMIN HCL 500 MG PO TABS: 500.0000 mg | ORAL_TABLET | Freq: Two times a day (BID) | ORAL | Status: DC

## 2016-07-22 NOTE — Patient Instructions (Addendum)
I would cut the metformin back to 500mg  twice a day.  See if you can tolerate that.  You may need to increase your insulin in the meantime.  Update me as needed.  After the metformin change, you can stop the invokana.   You may need to increase your insulin again thereafter.   Take care.  Glad to see you.  Rosaria Ferries will call about your referral.

## 2016-07-22 NOTE — Progress Notes (Signed)
Diabetes:  Using medications without difficulties:GI upset on metformin 2000mg  a day.   D/w pt.  Hypoglycemic episodes:no Hyperglycemic episodes:no Feet problems:no new sx.   Blood Sugars averaging: ~100 usually in the AM.  eye exam within last year: yes Due for labs.  34 units per day of insulin.   He wanted to get off invokana.  D/w pt about CV risk reduction and also amputation risk.  We talked about actos.  At this point, there is no clear evidence to direct a change in meds (re: actos).  Med is tolerated (no h/o bladder CA and no h/o blood in urine) and benefit from control of DM2 outweighs other considerations.  Pt agrees to continue actos.    He has back pain at baseline.  He now has some pain on the B lower flanks, R slightly worse then L.  No rash no bruising.  No trauma. B posterior thigh pain, R>L, intermittently.  tumeric prev helped his back pain, with limited use of oxycodone.  Needed refill.   No ADE on med.  He admits to being afraid to go through surgery at this point and is willing to put up with the pain as the fear of surgery is currently greater than his pain level.  He has gradually tapered down is xanax dose.  He has gotten down to 0.25mg  about twice a week.  Slow taper per patient, d/w pt.   PMH and SH reviewed  Meds, vitals, and allergies reviewed.   ROS: Per HPI unless specifically indicated in ROS section   GEN: nad, alert and oriented HEENT: mucous membranes moist NECK: supple w/o LA CV: rrr. PULM: ctab, no inc wob ABD: soft, +bs EXT: no edema SKIN: no acute rash Back not ttp in midline, no cva pain, B lower back paraspinal muscles ttp w/o bruising or rash.   Able to bear weight.

## 2016-07-22 NOTE — Progress Notes (Signed)
Pre visit review using our clinic review tool, if applicable. No additional management support is needed unless otherwise documented below in the visit note. 

## 2016-07-23 NOTE — Assessment & Plan Note (Signed)
Discussed with patient about options. Reasonable to taper metformin dose the meantime, to see if GI side effects are improved. He will likely need to increase his insulin in the meantime. He can then stop Invokana. Again will likely have to increase his insulin in the meantime. All discussed with patient. He understands plan. He wanted to talk to endocrinology for a second opinion. This is reasonable. Refer. Discussed with patient about diet. His exercise is limited due to his back pain. >25 minutes spent in face to face time with patient, >50% spent in counselling or coordination of care.

## 2016-07-23 NOTE — Assessment & Plan Note (Signed)
He is taper down on Xanax. Continue slow taper. Update me as needed. Okay for outpatient follow-up.

## 2016-07-23 NOTE — Assessment & Plan Note (Signed)
See above. No new symptoms. Continue oxycodone when necessary. He is trying to put off surgery.

## 2016-08-06 ENCOUNTER — Encounter: Payer: Self-pay | Admitting: Family Medicine

## 2016-08-08 ENCOUNTER — Ambulatory Visit (INDEPENDENT_AMBULATORY_CARE_PROVIDER_SITE_OTHER): Payer: PPO | Admitting: Family Medicine

## 2016-08-08 ENCOUNTER — Encounter: Payer: Self-pay | Admitting: Family Medicine

## 2016-08-08 VITALS — BP 136/78 | HR 98 | Temp 97.9°F | Wt 195.5 lb

## 2016-08-08 DIAGNOSIS — F411 Generalized anxiety disorder: Secondary | ICD-10-CM | POA: Diagnosis not present

## 2016-08-08 DIAGNOSIS — R109 Unspecified abdominal pain: Secondary | ICD-10-CM | POA: Diagnosis not present

## 2016-08-08 LAB — POC URINALSYSI DIPSTICK (AUTOMATED)
Bilirubin, UA: NEGATIVE
Blood, UA: NEGATIVE
Ketones, UA: NEGATIVE
Leukocytes, UA: NEGATIVE
Nitrite, UA: NEGATIVE
Protein, UA: NEGATIVE
Spec Grav, UA: 1.025
Urobilinogen, UA: 0.2
pH, UA: 6

## 2016-08-08 MED ORDER — RANITIDINE HCL 150 MG PO TABS: 150.0000 mg | ORAL_TABLET | Freq: Every day | ORAL | Status: DC

## 2016-08-08 MED ORDER — METFORMIN HCL 500 MG PO TABS: ORAL_TABLET | ORAL | Status: DC

## 2016-08-08 NOTE — Patient Instructions (Signed)
Stop metformin for a few days.   If not better, then start taking zantac 150mg  a day.  If not better, then we'll need to consider a CT scan of your abd.  Take care.  Glad to see you.  Update me early next week, either way.

## 2016-08-08 NOTE — Progress Notes (Signed)
Tapered off BZD in the meantime.  Med list updated.    Still on invokana.  Was able to taper down to 33 units recently.  Sugar 90-100 recently in the AM.    He cut back on metformin but still has abd pain.  His diarrhea is better.  Abd pain is constant but noted to be clearly worse when/after eating, worse in the last few weeks.  H/o appendectomy.  Still has GB.  Smoker.  No blood in stool.  No vomiting.  Some nausea.  Pain is worse in the lower abd.  Sometime with pain with urination.  More belching since cutting back on the metformin.  Taking gas-x as needed with some relief.    Down 13 lbs in the last few months, with lifestyle changes noted.   FH IBS noted per patient report but he hasn't had lifelong sx like this.    Meds, vitals, and allergies reviewed.   ROS: Per HPI unless specifically indicated in ROS section   GEN: nad, alert and oriented HEENT: mucous membranes moist NECK: supple w/o LA CV: rrr. PULM: ctab, no inc wob ABD: soft, +bs, abd is sore in general but not ttp during exam. No rebound.  EXT: no edema SKIN: no acute rash

## 2016-08-08 NOTE — Progress Notes (Signed)
Pre visit review using our clinic review tool, if applicable. No additional management support is needed unless otherwise documented below in the visit note. 

## 2016-08-09 LAB — COMPREHENSIVE METABOLIC PANEL
ALBUMIN: 4.3 g/dL (ref 3.5–5.2)
ALT: 18 U/L (ref 0–53)
AST: 23 U/L (ref 0–37)
Alkaline Phosphatase: 61 U/L (ref 39–117)
BUN: 12 mg/dL (ref 6–23)
CHLORIDE: 104 meq/L (ref 96–112)
CO2: 29 meq/L (ref 19–32)
Calcium: 9.3 mg/dL (ref 8.4–10.5)
Creatinine, Ser: 1.05 mg/dL (ref 0.40–1.50)
GFR: 74.29 mL/min (ref >60.00)
Glucose, Bld: 139 mg/dL — ABNORMAL HIGH (ref 70–99)
POTASSIUM: 4.6 meq/L (ref 3.5–5.1)
SODIUM: 141 meq/L (ref 135–145)
Total Bilirubin: 0.6 mg/dL (ref 0.2–1.2)
Total Protein: 7.3 g/dL (ref 6.0–8.3)

## 2016-08-09 LAB — CBC WITH DIFFERENTIAL/PLATELET
BASOS PCT: 1.9 % (ref 0.0–3.0)
Basophils Absolute: 0.1 10*3/uL (ref 0.0–0.1)
EOS PCT: 1.1 % (ref 0.0–5.0)
Eosinophils Absolute: 0.1 10*3/uL (ref 0.0–0.7)
HEMATOCRIT: 48.7 % (ref 39.0–52.0)
HEMOGLOBIN: 16.4 g/dL (ref 13.0–17.0)
LYMPHS PCT: 24.5 % (ref 12.0–46.0)
Lymphs Abs: 1.6 10*3/uL (ref 0.7–4.0)
MCHC: 33.6 g/dL (ref 30.0–36.0)
MCV: 92.1 fl (ref 78.0–100.0)
Monocytes Absolute: 0.6 10*3/uL (ref 0.1–1.0)
Monocytes Relative: 8.4 % (ref 3.0–12.0)
NEUTROS ABS: 4.3 10*3/uL (ref 1.4–7.7)
Neutrophils Relative %: 64.1 % (ref 43.0–77.0)
PLATELETS: 231 10*3/uL (ref 150.0–400.0)
RBC: 5.29 Mil/uL (ref 4.22–5.81)
RDW: 14.8 % (ref 11.5–15.5)
WBC: 6.7 10*3/uL (ref 4.0–10.5)

## 2016-08-09 LAB — LIPASE: Lipase: 18 U/L (ref 11.0–59.0)

## 2016-08-09 NOTE — Assessment & Plan Note (Signed)
Doing well tapered off bzd.  Continue off med.

## 2016-08-09 NOTE — Assessment & Plan Note (Addendum)
Check routine labs today.  D/w pt about ddx:  GB vs chronic but not acute mesenteric ischemia vs GERD/HH/PUD vs metformin.   Possible that metformin contributes, would stop in the meantime.  Can add on zantac in case GERD is contributing.  Unlikely urinary source- sugar in urine likely related to invokana.   If not better would need consideration of GB vs mesenteric ischemia.  Former smoker.  D/w pt.   At this point, still okay for outpatient f/u.  He agrees.   >25 minutes spent in face to face time with patient, >50% spent in counselling or coordination of care.  He'll update me in a few days.

## 2016-08-12 ENCOUNTER — Encounter: Payer: Self-pay | Admitting: Family Medicine

## 2016-08-16 ENCOUNTER — Other Ambulatory Visit: Payer: Self-pay | Admitting: Family Medicine

## 2016-08-16 MED ORDER — PROPRANOLOL HCL 10 MG PO TABS: 20.0000 mg | ORAL_TABLET | Freq: Two times a day (BID) | ORAL | 2 refills | Status: DC

## 2016-08-16 NOTE — Telephone Encounter (Signed)
Pt request refill of propranolol with updated instructions of 2 tabs po bid as discussed in 06/25/16 email. Pt said no lightheadedness, no BP issues and tremors are better.  Pt said has discussed at office appts since email was sent on 06/25/16.Refilled propanolol with new instructions to medic[facility]. Pt voiced understanding. FYI to Dr [name].

## 2016-08-20 ENCOUNTER — Encounter: Payer: Self-pay | Admitting: Family Medicine

## 2016-08-21 ENCOUNTER — Other Ambulatory Visit: Payer: Self-pay | Admitting: Family Medicine

## 2016-09-05 ENCOUNTER — Encounter: Payer: Self-pay | Admitting: Internal Medicine

## 2016-09-05 ENCOUNTER — Ambulatory Visit (INDEPENDENT_AMBULATORY_CARE_PROVIDER_SITE_OTHER): Payer: PPO | Admitting: Internal Medicine

## 2016-09-05 VITALS — BP 149/90 | HR 88 | Wt 191.0 lb

## 2016-09-05 DIAGNOSIS — E1165 Type 2 diabetes mellitus with hyperglycemia: Secondary | ICD-10-CM

## 2016-09-05 DIAGNOSIS — E1142 Type 2 diabetes mellitus with diabetic polyneuropathy: Secondary | ICD-10-CM | POA: Diagnosis not present

## 2016-09-05 DIAGNOSIS — IMO0002 Reserved for concepts with insufficient information to code with codable children: Secondary | ICD-10-CM

## 2016-09-05 MED ORDER — GLIPIZIDE ER 2.5 MG PO TB24: 2.5000 mg | ORAL_TABLET | Freq: Every day | ORAL | 5 refills | Status: DC

## 2016-09-05 MED ORDER — INSULIN PEN NEEDLE 32G X 4 MM MISC: 3 refills | Status: DC

## 2016-09-05 NOTE — Progress Notes (Signed)
Patient ID: Walter Harris, male   DOB: [birth year], 70 y.o.   MRN: [id number]   HPI: Walter Harris is a 70 y.o.-year-old male, referred by his PCP, Dr. [name], for management of DM2, dx in ~2007, insulin-dependent soon after dx, uncontrolled, with complications (PN). He is here with his wife who offers part of the history.  Last hemoglobin A1c was: Lab Results  Component Value Date   HGBA1C 7.2 (H) 07/22/2016   HGBA1C 7.9 (H) 03/14/2016   HGBA1C 8.0 (H) 12/28/2015   Pt is on a regimen of: - Lantus 32 >> 35 units at bedtime  - Invokana 150 (1/2 of 300 mg) daily in am He stopped Metformin - in last month >> GI sx . He stopped Actos >> mm cramps, generalized pain.  Pt checks his sugars 1-2x a day and they are: - am: 87-112, 127 - 2h after b'fast: 140-180 - before lunch: n/c - 2h after lunch: n/c - before dinner: n/c - 2h after dinner: n/c - bedtime: n/c - nighttime: n/c No lows. Lowest sugar was 87; he has hypoglycemia awareness at ~70.  Highest sugar was 190.  Glucometer: [employer]. He obtains strips on [employer] ($11 versus $60 in the pharmacy).   Pt's meals are: - Breakfast: oatmeal, bacon + 1 egg, toast - Lunch: sandwich or soup - Dinner: meat + 2 veggies - Snacks: 2 or 3    He cannot exercise anymore b/c back pain.   - no CKD, last BUN/creatinine:  Lab Results  Component Value Date   BUN 12 08/08/2016   BUN 12 07/22/2016   CREATININE 1.05 08/08/2016   CREATININE 1.04 07/22/2016   - last set of lipids: Lab Results  Component Value Date   CHOL 210 (H) 03/14/2016   HDL 42.80 03/14/2016   LDLCALC 143 (H) 03/14/2016   LDLDIRECT 160.2 06/01/2012   TRIG 121.0 03/14/2016   CHOLHDL 5 03/14/2016   - last eye exam was 06/07/2016. No DR.  - no numbness and tingling in his feet.  Pt has no FH of DM.  ROS: Constitutional: + weight loss, + Decreased appetite, + fatigue, no subjective hyperthermia/hypothermia Eyes: no blurry vision, no xerophthalmia ENT: no sore  throat, no nodules palpated in throat, + dysphagia/no odynophagia, no hoarseness, + decreased hearing Cardiovascular: no CP/SOB/palpitations/leg swelling Respiratory: + cough/no SOB Gastrointestinal: + N/no V/+ D/no C/+ heartburn Musculoskeletal: + Both: muscle/joint aches Skin: no rashes Neurological: + tremors/no numbness/tingling/+ dizziness - started yesterday after bending down Psychiatric: no depression/anxiety  Past Medical History:  Diagnosis Date  . Basal cell carcinoma    multiple  . Cataracts, both eyes   . Complication of anesthesia    post-operative cognitive dysfunction after ACDF on 05/01/11 [facility])  . 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 Diabetes mellitus   . DJD (degenerative joint disease)    lumbar spine  . Hearing impaired    hearing aids-transmitter rt   . History of blood transfusion    as a baby in 23  . History of colonic polyps   . Hyperlipidemia   . Hypertension    pt. denies high blood pressure  . Kidney stone 1967  . Nose fracture    3x  . OCD (obsessive compulsive disorder)   . Stroke [facility])    "mini stroke, that's what gave me the tremors"  . Stuttering   . Tremor, essential   . Wears glasses    Past Surgical History:  Procedure Laterality  Date  . APPENDECTOMY  1960  . BACK SURGERY  2012   lumbar surgery  . CERVICAL FUSION  1990  . CERVICAL FUSION  8/12   multiple levels with plates  . CHOLESTEATOMA EXCISION  1993  . COLLATERAL LIGAMENT REPAIR, ELBOW     bilateral, nerve improvement left 08/04, right 09/04  . COLONOSCOPY    . CYST EXCISION     back of cervical spine total of 5 surgeries  . EXTERNAL EAR SURGERY     multiple ear surguries as a child  . OTHER SURGICAL HISTORY     benign head tumor#5  . OTHER SURGICAL HISTORY     sebaceous cysts-post neck x5  . SHOULDER ARTHROSCOPY W/ ACROMIAL REPAIR  08/2004   left shoulder  . TONSILLECTOMY    . ULNAR NERVE TRANSPOSITION Right 05/13/2013   Procedure: RIGHT ULNAR NERVE  DECOMPRESSION/TRANSPOSITION;  Surgeon: [name]., MD;  Location: [facility];  Service: Orthopedics;  Laterality: Right;   Social History   Social History  . Marital status: Married    Spouse name: N/A  . Number of children: 1  . Years of education: N/A   Occupational History  . disabled Retired   Social History Main Topics  . Smoking status: Current Some Day Smoker    Packs/day: 0.50    Years: 40.00    Types: Pipe    Last attempt to quit: 09/10/2003  . Smokeless tobacco: Never Used  . Alcohol use No  . Drug use: No  . Sexual activity: No   Other Topics Concern  . Not on file   Social History Narrative   Married, 1992   1 biological child, some contact as of 2017   Wife has 2 kids, no contact as of 2017   Prev worked as Designer, television/film set, Lobbyist, etc   Current Outpatient Prescriptions on File Prior to Visit  Medication Sig Dispense Refill  . canagliflozin (INVOKANA) 300 MG TABS tablet 0.5 tab a day. Increase to a whole tab if sugar is consistently elevated. 90 tablet 3  . glucose blood test strip Use as instructed to test blood sugar 4 times daily, dx: 250.02    . Insulin Glargine (LANTUS SOLOSTAR) 100 UNIT/ML Solostar Pen TAKE UP TO 50 UNITS DAILY AS DIRECTED 15 mL prn  . Insulin Pen Needle 31G X 8 MM MISC Use daily with insulin injection 100 each 3  . oxyCODONE-acetaminophen (PERCOCET/ROXICET) 5-325 MG tablet Take 1 tablet by mouth every 6 (six) hours as needed (pain). 60 tablet 0  . propranolol (INDERAL) 10 MG tablet Take 2 tablets (20 mg total) by mouth 2 (two) times daily. 360 tablet 2  . ranitidine (ZANTAC) 150 MG tablet Take 1 tablet (150 mg total) by mouth daily.    . traZODone (DESYREL) 50 MG tablet Take 1-1.5 tablets (50-75 mg total) by mouth at bedtime as needed for sleep. 60 tablet 1  . TURMERIC PO Take 1 tablet by mouth daily. Turmeric Curcumin with Bioperine 1,000 mg      No current facility-administered medications on  file prior to visit.    Allergies  Allergen Reactions  . Bee Venom Anaphylaxis  . Pravastatin Other (See Comments)    Severe myalgias  . Ace Inhibitors     Cough  . Actos [Pioglitazone] Other (See Comments)    Intolerant, not allergic.   . Angiotensin Receptor Blockers Other (See Comments)    cramps  . Aripiprazole Other (See Comments)  Unknown reaction many years ago  . Crestor [Rosuvastatin Calcium] Other (See Comments)    Aches, even with twice weekly dosing  . Losartan Other (See Comments)    Leg cramps  . Metformin And Related Other (See Comments)    Intolerant, not allergic.   . Olanzapine Other (See Comments)    Unknown reaction many years ago   . Other Cough    Blood pressure medication caused a cough after one dose  . Pneumovax 23 [Pneumococcal Vac Polyvalent] Other (See Comments)    aches  . Primidone Other (See Comments)    Unknown allergic reaction many years ago   . Bactrim [Sulfamethoxazole-Trimethoprim] Other (See Comments)    Mild reaction per Dr. Silvio Pate, pt does not remember the reaction  . Codeine Phosphate Rash  . Divalproex Sodium Other (See Comments)     tremor  . Lithium Carbonate Other (See Comments)    Tremors    Family History  Problem Relation Age of Onset  . OCD Mother   . Bipolar disorder Mother   . Cancer Mother   . 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 Arthritis Sister   . OCD Sister   . Colon cancer Neg Hx   . Prostate cancer Neg Hx    PE: BP (!) 149/90   Pulse 88   Wt 191 lb (86.6 kg)   SpO2 94%   BMI 28.21 kg/m  Wt Readings from Last 3 Encounters:  09/05/16 191 lb (86.6 kg)  08/08/16 195 lb 8 oz (88.7 kg)  07/22/16 199 lb (90.3 kg)   Constitutional: overweight, in NAD Eyes: PERRLA, EOMI, no exophthalmos ENT: moist mucous membranes, no thyromegaly, no cervical lymphadenopathy Cardiovascular: RRR, No MRG Respiratory: CTA B Gastrointestinal: abdomen soft, NT, ND, BS+ Musculoskeletal: no  deformities, strength intact in all 4 Skin: moist, warm, no rashes Neurological: + tremor with outstretched hands (predominantly intentional tremor), DTR normal in all 4  ASSESSMENT: 1. DM2, insulin-dependent, uncontrolled, with complications - PN  2. Dysgeusia  PLAN:  1. Patient with long-standing, uncontrolled diabetes, on oral antidiabetic regimen, which became insufficient.His sugars have improved recently based on the HbA1c of 7.2% obtained last month. However, he stopped metformin a month ago due to GI side effects. He would not want to restart. He is worried about the [facility] due to negative publicity. We discussed about the [place] study that showed an increased risk of amputations with Invokana. I pointed out the fact that pts that were more likely to require amputations were: men, with a HbA1c >8%, with previous amputations, with PN or PVD. He agrees to continue the medication for now. However, his sugars are great in a.m. but increase after meals, so I suggested to add a low-dose glipizide ER. We did discuss about a DPP 4 inhibitor GLP-1 receptor agonist, but he is in the doughnut hole now and could not afford those. He agrees to start the low-dose glipizide for now and we may be able to try some of the newer medicines after the first of the year. - I suggested to:  Patient Instructions  Please continue: - Lantus 35 units at bedtime  - Invokana 150 daily in am  Please add: - Glipizide ER 2.5 mg daily before b'fast  Please let me know if the sugars are consistently <80 or >200.  Please return in 1.5 months with your sugar log.   - Advised him to start checking sugars at different times of the day -  check 2 times a day, rotating checks - given sugar log and advised how to fill it and to bring it at next appt  - given foot care handout and explained the principles  - given instructions for hypoglycemia management "15-15 rule"  - advised for yearly eye exams >> he is up-to-date -  He is also up-to-date with his flu shot - Return to clinic in 1.5 mo with sugar log   2. Dysgeusia - I suggested to start a MVI (? Zn def.)  Philemon Kingdom, MD PhD [facility] Endocrinology

## 2016-09-05 NOTE — Patient Instructions (Addendum)
Please continue: - Lantus 35 units at bedtime  - Invokana 150 daily in am  Please add: - Glipizide ER 2.5 mg daily before b'fast  Please let me know if the sugars are consistently <80 or >200.  Please return in 1.5 months with your sugar log.   PATIENT INSTRUCTIONS FOR TYPE 2 DIABETES:  **Please join MyChart!** - see attached instructions about how to join if you have not done so already.  DIET AND EXERCISE Diet and exercise is an important part of diabetic treatment.  We recommended aerobic exercise in the form of brisk walking (working between 40-60% of maximal aerobic capacity, similar to brisk walking) for 150 minutes per week (such as 30 minutes five days per week) along with 3 times per week performing 'resistance' training (using various gauge rubber tubes with handles) 5-10 exercises involving the major muscle groups (upper body, lower body and core) performing 10-15 repetitions (or near fatigue) each exercise. Start at half the above goal but build slowly to reach the above goals. If limited by weight, joint pain, or disability, we recommend daily walking in a swimming pool with water up to waist to reduce pressure from joints while allow for adequate exercise.    BLOOD GLUCOSES Monitoring your blood glucoses is important for continued management of your diabetes. Please check your blood glucoses 2-4 times a day: fasting, before meals and at bedtime (you can rotate these measurements - e.g. one day check before the 3 meals, the next day check before 2 of the meals and before bedtime, etc.).   HYPOGLYCEMIA (low blood sugar) Hypoglycemia is usually a reaction to not eating, exercising, or taking too much insulin/ other diabetes drugs.  Symptoms include tremors, sweating, hunger, confusion, headache, etc. Treat IMMEDIATELY with 15 grams of Carbs: . 4 glucose tablets .  cup regular juice/soda . 2 tablespoons raisins . 4 teaspoons sugar . 1 tablespoon honey Recheck blood glucose  in 15 mins and repeat above if still symptomatic/blood glucose <100.  RECOMMENDATIONS TO REDUCE YOUR RISK OF DIABETIC COMPLICATIONS: * Take your prescribed MEDICATION(S) * Follow a DIABETIC diet: Complex carbs, fiber rich foods, (monounsaturated and polyunsaturated) fats * AVOID saturated/trans fats, high fat foods, >2,300 mg salt per day. * EXERCISE at least 5 times a week for 30 minutes or preferably daily.  * DO NOT SMOKE OR DRINK more than 1 drink a day. * Check your FEET every day. Do not wear tightfitting shoes. Contact us if you develop an ulcer * See your EYE doctor once a year or more if needed * Get a FLU shot once a year * Get a PNEUMONIA vaccine once before and once after age 23 years  GOALS:  * Your Hemoglobin A1c of <7%  * fasting sugars need to be <130 * after meals sugars need to be <180 (2h after you start eating) * Your Systolic BP should be [id number] or lower  * Your Diastolic BP should be 80 or lower  * Your HDL (Good Cholesterol) should be 40 or higher  * Your LDL (Bad Cholesterol) should be 100 or lower. * Your Triglycerides should be 150 or lower  * Your Urine microalbumin (kidney function) should be <30 * Your Body Mass Index should be 25 or lower    Please consider the following ways to cut down carbs and fat and increase fiber and micronutrients in your diet: - substitute whole grain for white bread or pasta - substitute brown rice for white rice - substitute 90-calorie flat  bread pieces for slices of bread when possible - substitute sweet potatoes or yams for white potatoes - substitute humus for margarine - substitute tofu for cheese when possible - substitute almond or rice milk for regular milk (would not drink soy milk daily due to concern for soy estrogen influence on breast cancer risk) - substitute dark chocolate for other sweets when possible - substitute water - can add lemon or orange slices for taste - for diet sodas (artificial sweeteners will trick  your body that you can eat sweets without getting calories and will lead you to overeating and weight gain in the long run) - do not skip breakfast or other meals (this will slow down the metabolism and will result in more weight gain over time)  - can try smoothies made from fruit and almond/rice milk in am instead of regular breakfast - can also try old-fashioned (not instant) oatmeal made with almond/rice milk in am - order the dressing on the side when eating salad at a restaurant (pour less than half of the dressing on the salad) - eat as little meat as possible - can try juicing, but should not forget that juicing will get rid of the fiber, so would alternate with eating raw veg./fruits or drinking smoothies - use as little oil as possible, even when using olive oil - can dress a salad with a mix of balsamic vinegar and lemon juice, for e.g. - use agave nectar, stevia sugar, or regular sugar rather than artificial sweateners - steam or broil/roast veggies  - snack on veggies/fruit/nuts (unsalted, preferably) when possible, rather than processed foods - reduce or eliminate aspartame in diet (it is in diet sodas, chewing gum, etc) Read the labels!  Try to read Dr. Janene Harvey book: "Program for Reversing Diabetes" for other ideas for healthy eating.

## 2016-09-09 ENCOUNTER — Encounter: Payer: Self-pay | Admitting: Internal Medicine

## 2016-09-15 ENCOUNTER — Encounter: Payer: Self-pay | Admitting: Internal Medicine

## 2016-09-17 ENCOUNTER — Encounter: Payer: Self-pay | Admitting: Family Medicine

## 2016-09-17 ENCOUNTER — Other Ambulatory Visit: Payer: Self-pay | Admitting: Internal Medicine

## 2016-09-17 ENCOUNTER — Encounter: Payer: Self-pay | Admitting: Internal Medicine

## 2016-09-17 MED ORDER — METFORMIN HCL ER 500 MG PO TB24: 500.0000 mg | ORAL_TABLET | Freq: Two times a day (BID) | ORAL | 5 refills | Status: DC

## 2016-09-20 ENCOUNTER — Encounter: Payer: Self-pay | Admitting: Family Medicine

## 2016-09-20 ENCOUNTER — Telehealth: Payer: Self-pay | Admitting: Family Medicine

## 2016-09-20 NOTE — Telephone Encounter (Signed)
[facility] Patient Name: MAI NICOLICH DOB: 11-14-1945 Initial Comment Jaimes states he has been having stomach pain, diarrhea, and burning. Dr thought it was diabetic meds so he has been taken off of them. He is now having same problems again. Nurse Assessment Nurse: Dimas Chyle, RN, Dellis Filbert Date/Time Eilene Ghazi Time): 09/20/2016 10:38:46 AM Confirm and document reason for call. If symptomatic, describe symptoms. ---Edd Arbour states he has been having stomach pain, diarrhea, and burning. Dr thought it was diabetic meds so he has been taken off of them. He is now having same problems again. Symptoms started 3 weeks ago. No fever. No blood in stools. Does the patient have any new or worsening symptoms? ---Yes Will a triage be completed? ---Yes Related visit to physician within the last 2 weeks? ---No Does the PT have any chronic conditions? (i.e. diabetes, asthma, etc.) ---Yes List chronic conditions. ---Diabetes type 2 Is this a behavioral health or substance abuse call? ---No Guidelines Guideline Title Affirmed Question Affirmed Notes Diarrhea [1] MILD (e.g., 1-3 or more stools than normal in past 24 hours) diarrhea without known cause AND [2] present > 7 days Final Disposition User See PCP When Office is Open (within 3 days) Dimas Chyle, RN, [employer] did not want to be seen by another provider and next available appointment with PCP was on 09/24/16. Referrals REFERRED TO PCP OFFICE Disagree/Comply: Comply

## 2016-09-20 NOTE — Telephone Encounter (Signed)
Pt has appt with Dr [name] on 09/24/16 at 8:15.

## 2016-09-23 ENCOUNTER — Other Ambulatory Visit: Payer: Self-pay | Admitting: Family Medicine

## 2016-09-24 ENCOUNTER — Ambulatory Visit: Payer: Self-pay | Admitting: Family Medicine

## 2016-09-24 NOTE — Telephone Encounter (Signed)
Electronic refill request. Last Filled:    60 tablet 1 05/28/2016  Please advise.

## 2016-09-26 NOTE — Telephone Encounter (Signed)
Sent. Thanks.   

## 2016-09-30 ENCOUNTER — Other Ambulatory Visit: Payer: Self-pay | Admitting: Gastroenterology

## 2016-09-30 DIAGNOSIS — R1084 Generalized abdominal pain: Secondary | ICD-10-CM | POA: Diagnosis not present

## 2016-09-30 DIAGNOSIS — R634 Abnormal weight loss: Secondary | ICD-10-CM | POA: Diagnosis not present

## 2016-10-01 DIAGNOSIS — H903 Sensorineural hearing loss, bilateral: Secondary | ICD-10-CM | POA: Diagnosis not present

## 2016-10-01 DIAGNOSIS — I951 Orthostatic hypotension: Secondary | ICD-10-CM | POA: Diagnosis not present

## 2016-10-01 DIAGNOSIS — G20 Parkinson's disease: Secondary | ICD-10-CM | POA: Diagnosis not present

## 2016-10-01 DIAGNOSIS — H6981 Other specified disorders of Eustachian tube, right ear: Secondary | ICD-10-CM | POA: Diagnosis not present

## 2016-10-04 DIAGNOSIS — L570 Actinic keratosis: Secondary | ICD-10-CM | POA: Diagnosis not present

## 2016-10-04 DIAGNOSIS — C44622 Squamous cell carcinoma of skin of right upper limb, including shoulder: Secondary | ICD-10-CM | POA: Diagnosis not present

## 2016-10-04 DIAGNOSIS — L821 Other seborrheic keratosis: Secondary | ICD-10-CM | POA: Diagnosis not present

## 2016-10-04 DIAGNOSIS — C44629 Squamous cell carcinoma of skin of left upper limb, including shoulder: Secondary | ICD-10-CM | POA: Diagnosis not present

## 2016-10-04 DIAGNOSIS — D485 Neoplasm of uncertain behavior of skin: Secondary | ICD-10-CM | POA: Diagnosis not present

## 2016-10-04 DIAGNOSIS — X32XXXA Exposure to sunlight, initial encounter: Secondary | ICD-10-CM | POA: Diagnosis not present

## 2016-10-09 ENCOUNTER — Ambulatory Visit
Admission: RE | Admit: 2016-10-09 | Discharge: 2016-10-09 | Disposition: A | Discharge status: Home / Self Care | Payer: PPO | Source: Ambulatory Visit | Attending: Gastroenterology | Admitting: Gastroenterology

## 2016-10-09 DIAGNOSIS — R634 Abnormal weight loss: Secondary | ICD-10-CM

## 2016-10-09 DIAGNOSIS — R197 Diarrhea, unspecified: Secondary | ICD-10-CM | POA: Diagnosis not present

## 2016-10-09 DIAGNOSIS — I700 Atherosclerosis of aorta: Secondary | ICD-10-CM | POA: Insufficient documentation

## 2016-10-09 DIAGNOSIS — R1084 Generalized abdominal pain: Secondary | ICD-10-CM | POA: Insufficient documentation

## 2016-10-09 LAB — POCT I-STAT CREATININE: Creatinine, Ser: 0.9 mg/dL (ref 0.61–1.24)

## 2016-10-09 MED ORDER — IOPAMIDOL (ISOVUE-300) INJECTION 61%
100.0000 mL | Freq: Once | INTRAVENOUS | Status: AC | PRN
  Administered 2016-10-09: 100 mL via INTRAVENOUS

## 2016-10-23 ENCOUNTER — Encounter: Payer: Self-pay | Admitting: Internal Medicine

## 2016-10-23 ENCOUNTER — Ambulatory Visit (INDEPENDENT_AMBULATORY_CARE_PROVIDER_SITE_OTHER): Payer: PPO | Admitting: Internal Medicine

## 2016-10-23 VITALS — BP 122/84 | HR 80 | Ht 68.0 in | Wt 184.0 lb

## 2016-10-23 DIAGNOSIS — IMO0002 Reserved for concepts with insufficient information to code with codable children: Secondary | ICD-10-CM

## 2016-10-23 DIAGNOSIS — E1165 Type 2 diabetes mellitus with hyperglycemia: Secondary | ICD-10-CM | POA: Diagnosis not present

## 2016-10-23 DIAGNOSIS — E1142 Type 2 diabetes mellitus with diabetic polyneuropathy: Secondary | ICD-10-CM | POA: Diagnosis not present

## 2016-10-23 LAB — POCT GLYCOSYLATED HEMOGLOBIN (HGB A1C): HEMOGLOBIN A1C: 6.8

## 2016-10-23 MED ORDER — CANAGLIFLOZIN 300 MG PO TABS: ORAL_TABLET | ORAL | 3 refills | Status: DC

## 2016-10-23 MED ORDER — INSULIN GLARGINE 100 UNIT/ML SOLOSTAR PEN: PEN_INJECTOR | SUBCUTANEOUS | 5 refills | Status: DC

## 2016-10-23 NOTE — Progress Notes (Signed)
Patient ID: Walter Harris, male   DOB: 09-23-1945, 71 y.o.   MRN: [id number]   HPI: Walter Harris is a 71 y.o.-year-old male,  DM2, dx in ~2007, insulin-dependent soon after dx, uncontrolled, with complications (PN). He is here with his wife who offers part of the history. Last visit 1.5 mo ago.  He lost 7 lbs in 1.5 mo! He walks 4-5x a week with his wife. He started eating healthier.  Last hemoglobin A1c was: Lab Results  Component Value Date   HGBA1C 7.2 (H) 07/22/2016   HGBA1C 7.9 (H) 03/14/2016   HGBA1C 8.0 (H) 12/28/2015   Pt is on a regimen of: - Lantus 32 >> 35 >> 30 units at bedtime  - Invokana 150 (1/2 of 300 mg) daily in am - Metformin ER 500 mg 2x a day with meals >> he tolerates this well He stopped Metformin >> GI sx  He stopped Actos >> mm cramps, generalized pain.  Pt checks his sugars 1-2x a day and they are: - am: 87-112, 127 >> 73-111, 133 - 2h after b'fast: 140-180 >> 106-133 - before lunch: n/c >> 104-144 - 2h after lunch: n/c >> 106-162, 186 - before dinner: n/c >> 98-106, 177 - 2h after dinner: n/c >> 139-184 - bedtime: n/c >> [id number] - nighttime: n/c No lows. Lowest sugar was 87; he has hypoglycemia awareness at ~70.  Highest sugar was 190.  Glucometer: [employer]. He obtains strips on [employer] ($11 versus $60 in the pharmacy).   Pt's meals are: - Breakfast: oatmeal, bacon + 1 egg, toast - Lunch: sandwich or soup - Dinner: meat + 2 veggies - Snacks: 2 or 3    He cannot exercise anymore b/c back pain.   - no CKD, last BUN/creatinine:  Lab Results  Component Value Date   BUN 12 08/08/2016   BUN 12 07/22/2016   CREATININE 0.90 10/09/2016   CREATININE 1.05 08/08/2016   - last set of lipids: Lab Results  Component Value Date   CHOL 210 (H) 03/14/2016   HDL 42.80 03/14/2016   LDLCALC 143 (H) 03/14/2016   LDLDIRECT 160.2 06/01/2012   TRIG 121.0 03/14/2016   CHOLHDL 5 03/14/2016   - last eye exam was 06/07/2016. No DR.  - no numbness and  tingling in his feet.  Pt has no FH of DM.  ROS: Constitutional: + weight loss, + Decreased appetite, + fatigue, no subjective hyperthermia/hypothermia Eyes: no blurry vision, no xerophthalmia ENT: no sore throat, no nodules palpated in throat, + dysphagia/no odynophagia, no hoarseness, + decreased hearing Cardiovascular: no CP/SOB/palpitations/leg swelling Respiratory: + cough/no SOB Gastrointestinal: + N/no V/+ D/no C/+ heartburn Musculoskeletal: + Both: muscle/joint aches Skin: no rashes Neurological: + tremors/no numbness/tingling/+ dizziness - started yesterday after bending down Psychiatric: no depression/anxiety  Past Medical History:  Diagnosis Date  . Basal cell carcinoma    multiple  . Cataracts, both eyes   . Complication of anesthesia    post-operative cognitive dysfunction after ACDF on 05/01/11 [facility])  . 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 Diabetes mellitus   . DJD (degenerative joint disease)    lumbar spine  . Hearing impaired    hearing aids-transmitter rt   . History of blood transfusion    as a baby in 43  . History of colonic polyps   . Hyperlipidemia   . Hypertension    pt. denies high blood pressure  . Kidney stone 1967  . Nose fracture  3x  . OCD (obsessive compulsive disorder)   . Stroke [facility])    "mini stroke, that's what gave me the tremors"  . Stuttering   . Tremor, essential   . Wears glasses    Past Surgical History:  Procedure Laterality Date  . APPENDECTOMY  1960  . BACK SURGERY  2012   lumbar surgery  . CERVICAL FUSION  1990  . CERVICAL FUSION  8/12   multiple levels with plates  . CHOLESTEATOMA EXCISION  1993  . COLLATERAL LIGAMENT REPAIR, ELBOW     bilateral, nerve improvement left 08/04, right 09/04  . COLONOSCOPY    . CYST EXCISION     back of cervical spine total of 5 surgeries  . EXTERNAL EAR SURGERY     multiple ear surguries as a child  . OTHER SURGICAL HISTORY     benign head tumor#5  . OTHER SURGICAL  HISTORY     sebaceous cysts-post neck x5  . SHOULDER ARTHROSCOPY W/ ACROMIAL REPAIR  08/2004   left shoulder  . TONSILLECTOMY    . ULNAR NERVE TRANSPOSITION Right 05/13/2013   Procedure: RIGHT ULNAR NERVE DECOMPRESSION/TRANSPOSITION;  Surgeon: [name]., MD;  Location: [place];  Service: Orthopedics;  Laterality: Right;   Social History   Social History  . Marital status: Married    Spouse name: N/A  . Number of children: 1  . Years of education: N/A   Occupational History  . disabled Retired   Social History Main Topics  . Smoking status: Current Some Day Smoker    Packs/day: 0.50    Years: 40.00    Types: Pipe    Last attempt to quit: 09/10/2003  . Smokeless tobacco: Never Used  . Alcohol use No  . Drug use: No  . Sexual activity: No   Other Topics Concern  . Not on file   Social History Narrative   Married, 1992   1 biological child, some contact as of 2017   Wife has 2 kids, no contact as of 2017   Prev worked as Designer, television/film set, Lobbyist, etc   Current Outpatient Prescriptions on File Prior to Visit  Medication Sig Dispense Refill  . canagliflozin (INVOKANA) 300 MG TABS tablet 0.5 tab a day. Increase to a whole tab if sugar is consistently elevated. 90 tablet 3  . glucose blood test strip Use as instructed to test blood sugar 4 times daily, dx: 250.02    . Insulin Glargine (LANTUS SOLOSTAR) 100 UNIT/ML Solostar Pen TAKE UP TO 50 UNITS DAILY AS DIRECTED 15 mL prn  . Insulin Pen Needle (CAREFINE PEN NEEDLES) 32G X 4 MM MISC Use 1x a day 100 each 3  . metFORMIN (GLUCOPHAGE-XR) 500 MG 24 hr tablet Take 1 tablet (500 mg total) by mouth 2 (two) times daily with a meal. 60 tablet 5  . oxyCODONE-acetaminophen (PERCOCET/ROXICET) 5-325 MG tablet Take 1 tablet by mouth every 6 (six) hours as needed (pain). 60 tablet 0  . propranolol (INDERAL) 10 MG tablet Take 2 tablets (20 mg total) by mouth 2 (two) times daily. 360 tablet 2  .  traZODone (DESYREL) 50 MG tablet TAKE 1 TO 1.5 TABLET(S) BY MOUTH AT BEDTIME AS NEEDED FOR SLEEP. 60 tablet 1  . ranitidine (ZANTAC) 150 MG tablet Take 1 tablet (150 mg total) by mouth daily. (Patient not taking: Reported on 10/23/2016)    . TURMERIC PO Take 1 tablet by mouth daily. Turmeric Curcumin with Bioperine 1,000 mg  No current facility-administered medications on file prior to visit.    Allergies  Allergen Reactions  . Bee Venom Anaphylaxis  . Pravastatin Other (See Comments)    Severe myalgias  . Ace Inhibitors     Cough  . Actos [Pioglitazone] Other (See Comments)    Intolerant, not allergic.   . Angiotensin Receptor Blockers Other (See Comments)    cramps  . Aripiprazole Other (See Comments)    Unknown reaction many years ago  . Crestor [Rosuvastatin Calcium] Other (See Comments)    Aches, even with twice weekly dosing  . Losartan Other (See Comments)    Leg cramps  . Metformin And Related Other (See Comments)    Intolerant, not allergic.   . Olanzapine Other (See Comments)    Unknown reaction many years ago   . Other Cough    Blood pressure medication caused a cough after one dose  . Pneumovax 23 [Pneumococcal Vac Polyvalent] Other (See Comments)    aches  . Primidone Other (See Comments)    Unknown allergic reaction many years ago   . Bactrim [Sulfamethoxazole-Trimethoprim] Other (See Comments)    Mild reaction per Dr. Silvio Pate, pt does not remember the reaction  . Codeine Phosphate Rash  . Divalproex Sodium Other (See Comments)     tremor  . Lithium Carbonate Other (See Comments)    Tremors    Family History  Problem Relation Age of Onset  . OCD Mother   . Bipolar disorder Mother   . Cancer Mother   . 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 Arthritis Sister   . OCD Sister   . Colon cancer Neg Hx   . Prostate cancer Neg Hx    PE: BP 122/84 (BP Location: Left Arm, Patient Position: Sitting)   Pulse 80   Ht 5\' 8"  (1.727 m)    Wt 184 lb (83.5 kg)   SpO2 98%   BMI 27.98 kg/m  Wt Readings from Last 3 Encounters:  10/23/16 184 lb (83.5 kg)  09/05/16 191 lb (86.6 kg)  08/08/16 195 lb 8 oz (88.7 kg)   Constitutional: overweight, in NAD Eyes: PERRLA, EOMI, no exophthalmos ENT: moist mucous membranes, no thyromegaly, no cervical lymphadenopathy Cardiovascular: RRR, No MRG Respiratory: CTA B Gastrointestinal: abdomen soft, NT, ND, BS+ Musculoskeletal: no deformities, strength intact in all 4 Skin: moist, warm, no rashes Neurological: + tremor with outstretched hands (predominantly intentional tremor), DTR normal in all 4  ASSESSMENT: 1. DM2, insulin-dependent, uncontrolled, with complications - PN  PLAN:  1. Patient with long-standing, uncontrolled diabetes, on oral antidiabetic regimen, now with much better sugars after we added Metformin ER (he tolerates this well) and improving his diet and exercise! He lost 7 lbs since last visit and he feels great! Will not change his regimen today, however, we discussed about reducing Lantus if his sugars improve further. - HbA1c today is better: 6.8%  - I suggested to:  Patient Instructions  Please continue: - Lantus 30 units at bedtime  - Invokana 150 daily in am - Metformin ER 500 mg 2x a day  Please let me know if the sugars are consistently <80 or >200.  Please return in 3 months with your sugar log.   KEEP UP THE GOOD WORK!  - Advised him to start checking sugars at different times of the day - check 2 times a day, rotating checks - advised for yearly eye exams >> he is up-to-date - He is  also up-to-date with his flu shot - Return to clinic in 3 mo with sugar log   Philemon Kingdom, MD PhD [facility] Endocrinology

## 2016-10-23 NOTE — Addendum Note (Signed)
Addended by: Caprice Beaver T on: 10/23/2016 11:58 AM   Modules accepted: Orders

## 2016-10-23 NOTE — Patient Instructions (Addendum)
Please continue: - Lantus 30 units at bedtime  - Invokana 150 daily in am - Metformin ER 500 mg 2x a day  Please let me know if the sugars are consistently <80 or >200.  Please return in 3 months with your sugar log.   KEEP UP THE GOOD WORK!

## 2016-10-30 ENCOUNTER — Encounter: Payer: Self-pay | Admitting: Family Medicine

## 2016-10-31 NOTE — Telephone Encounter (Signed)
See mychart message, last filled on 07/22/16 #60 tabs with 0 refills, Dr. [name] out of the office the rest of the week, please advise

## 2016-11-01 ENCOUNTER — Other Ambulatory Visit: Payer: Self-pay

## 2016-11-01 MED ORDER — OXYCODONE-ACETAMINOPHEN 5-325 MG PO TABS: 1.0000 | ORAL_TABLET | Freq: Four times a day (QID) | ORAL | 0 refills | Status: DC | PRN

## 2016-11-01 NOTE — Telephone Encounter (Signed)
Printed and in Kim's box 

## 2016-11-01 NOTE — Telephone Encounter (Signed)
Patient notified and Rx placed up front for pick up. 

## 2016-11-01 NOTE — Telephone Encounter (Signed)
Pt left v/m requesting rx oxycodone apap . Call when ready for pickup. Last printed # 60 on 11/01/16. Last seen 08/08/16.

## 2016-11-07 ENCOUNTER — Other Ambulatory Visit: Payer: Self-pay

## 2016-11-07 ENCOUNTER — Encounter: Payer: Self-pay | Admitting: Internal Medicine

## 2016-11-07 MED ORDER — METFORMIN HCL ER 500 MG PO TB24: 500.0000 mg | ORAL_TABLET | Freq: Two times a day (BID) | ORAL | 1 refills | Status: DC

## 2016-11-08 DIAGNOSIS — C44622 Squamous cell carcinoma of skin of right upper limb, including shoulder: Secondary | ICD-10-CM | POA: Diagnosis not present

## 2016-11-20 DIAGNOSIS — L905 Scar conditions and fibrosis of skin: Secondary | ICD-10-CM | POA: Diagnosis not present

## 2016-11-20 DIAGNOSIS — C44629 Squamous cell carcinoma of skin of left upper limb, including shoulder: Secondary | ICD-10-CM | POA: Diagnosis not present

## 2016-12-01 ENCOUNTER — Encounter: Payer: Self-pay | Admitting: Family Medicine

## 2016-12-03 ENCOUNTER — Ambulatory Visit: Payer: Self-pay | Admitting: Family Medicine

## 2016-12-03 ENCOUNTER — Encounter: Payer: Self-pay | Admitting: Family Medicine

## 2016-12-04 ENCOUNTER — Other Ambulatory Visit: Payer: Self-pay | Admitting: Family Medicine

## 2016-12-04 DIAGNOSIS — R251 Tremor, unspecified: Secondary | ICD-10-CM

## 2016-12-05 ENCOUNTER — Encounter: Payer: Self-pay | Admitting: Neurology

## 2016-12-06 ENCOUNTER — Encounter: Payer: Self-pay | Admitting: Family Medicine

## 2016-12-11 ENCOUNTER — Encounter: Payer: Self-pay | Admitting: Family Medicine

## 2016-12-12 ENCOUNTER — Other Ambulatory Visit: Payer: Self-pay | Admitting: Family Medicine

## 2016-12-12 ENCOUNTER — Telehealth: Payer: Self-pay | Admitting: Internal Medicine

## 2016-12-12 DIAGNOSIS — R9389 Abnormal findings on diagnostic imaging of other specified body structures: Secondary | ICD-10-CM

## 2016-12-13 ENCOUNTER — Encounter: Payer: Self-pay | Admitting: Family Medicine

## 2016-12-13 ENCOUNTER — Telehealth: Payer: Self-pay | Admitting: Family Medicine

## 2016-12-13 NOTE — Telephone Encounter (Signed)
Please arrange direct colonoscopy  Dx abnormal CT scan colon  Tell him I reviewed the scan and seems unlikely to be anything bad but will get it checked out  I have 4/11 4 PM open if he will take that  If not we will need to look at 0730 slots

## 2016-12-13 NOTE — Telephone Encounter (Signed)
Spoke with patient states he will call back on Monday morning to schedule appointment.

## 2016-12-13 NOTE — Telephone Encounter (Signed)
Received a call from Laie at [facility] offering our patient a pre visit next week and a Colonoscopy the week of April 16th with Dr Carlean Purl. The patient declined to accept these appointments because he said he wanted to see Dr Hilarie Fredrickson at [facility]. I explained to the patient that Dr Carlean Purl was the Dr that had accepted him and that Dr Hilarie Fredrickson did not. He then said he would have to think about it and  call us back.

## 2016-12-15 NOTE — Telephone Encounter (Signed)
The patient wanted a second opinion from [facility]. We tried to set this up. If he does not accept this, then we will have to defer to the patient and he will have to make arrangements.  I will sign off on this issue for now.  Thanks.

## 2016-12-16 ENCOUNTER — Encounter: Payer: Self-pay | Admitting: Family Medicine

## 2016-12-17 ENCOUNTER — Telehealth: Payer: Self-pay | Admitting: Family Medicine

## 2016-12-17 NOTE — Telephone Encounter (Signed)
Apparently the patient had declined the offered appointment. I don't see any appointment scheduled this point. See my chart message from patient.

## 2016-12-19 ENCOUNTER — Other Ambulatory Visit: Payer: Self-pay

## 2016-12-19 ENCOUNTER — Encounter: Payer: Self-pay | Admitting: Internal Medicine

## 2016-12-19 ENCOUNTER — Encounter: Payer: Self-pay | Admitting: Family Medicine

## 2016-12-23 ENCOUNTER — Encounter: Payer: PPO | Admitting: Internal Medicine

## 2016-12-23 DIAGNOSIS — T814XXA Infection following a procedure, initial encounter: Secondary | ICD-10-CM | POA: Diagnosis not present

## 2016-12-25 ENCOUNTER — Encounter: Payer: Self-pay | Admitting: Family Medicine

## 2016-12-25 DIAGNOSIS — L923 Foreign body granuloma of the skin and subcutaneous tissue: Secondary | ICD-10-CM | POA: Diagnosis not present

## 2016-12-26 ENCOUNTER — Encounter: Payer: Self-pay | Admitting: Family Medicine

## 2016-12-26 ENCOUNTER — Other Ambulatory Visit: Payer: Self-pay | Admitting: Family Medicine

## 2016-12-26 NOTE — Telephone Encounter (Signed)
Electronic refill request. Last office visit:   07/22/2016 Last Filled:    60 tablet 1 09/26/2016  Please advise.

## 2016-12-27 ENCOUNTER — Other Ambulatory Visit: Payer: Self-pay

## 2016-12-27 DIAGNOSIS — L923 Foreign body granuloma of the skin and subcutaneous tissue: Secondary | ICD-10-CM | POA: Diagnosis not present

## 2016-12-27 MED ORDER — OXYCODONE-ACETAMINOPHEN 5-325 MG PO TABS: 1.0000 | ORAL_TABLET | Freq: Four times a day (QID) | ORAL | 0 refills | Status: DC | PRN

## 2016-12-27 NOTE — Telephone Encounter (Signed)
Wife advised.  Rx left at front desk for pick up.  

## 2016-12-27 NOTE — Telephone Encounter (Signed)
Sent. Thanks.   

## 2016-12-27 NOTE — Telephone Encounter (Signed)
Pt left v/m requesting refill oxycodone apap. Call when ready for pick up. Last printed # 60 on 11/01/16.last seen 07/22/16.

## 2016-12-27 NOTE — Telephone Encounter (Signed)
Printed.  Thanks.  

## 2016-12-30 DIAGNOSIS — R935 Abnormal findings on diagnostic imaging of other abdominal regions, including retroperitoneum: Secondary | ICD-10-CM | POA: Diagnosis not present

## 2016-12-30 DIAGNOSIS — Z86010 Personal history of colonic polyps: Secondary | ICD-10-CM | POA: Diagnosis not present

## 2017-01-02 NOTE — Progress Notes (Addendum)
Subjective:   Walter Harris was seen in consultation in the movement disorder clinic at the request of Elsie Stain, MD.   This patient is accompanied in the office by his spouse who supplements the history.  The evaluation is for tremor.  Tremor started in the early 1990's and it involved both hands. Tremor is most noticeable when he would try to do something.  It is worse when he uses the hands.   There is a family hx of tremor in both father and mother.  Saw Dr. Erling Cruz years ago and placed on inderal LA and doesn't know when it was changed to regular inderal but states dose cut down over the years.  Pt thinks that dose needs increased.  Affected by caffeine:  No. Affected by alcohol:  Doesn't drink EtOH Affected by stress:  Yes.   Affected by fatigue:  No. Spills soup if on spoon:  Yes.   (uses weighted utensils but doesn't help) Spills glass of liquid if full:  Yes.   Affects ADL's (tying shoes, brushing teeth, etc):  Yes.   (has to use Copy)  Current/Previously tried tremor medications: propranolol 20 in the AM, 10 at night  Current medications that may exacerbate tremor:  was exposed to antipsychotics for years but off for some time  Outside reports reviewed: lab reports, office notes and radiology reports.I reviewed his MRI of the brain from June, 2013.  The patient was status post right mastoidectomy.  There was atrophy and mild small vessel disease.  Allergies  Allergen Reactions  . Bee Venom Anaphylaxis  . Pravastatin Other (See Comments)    Severe myalgias  . Ace Inhibitors     Cough  . Actos [Pioglitazone] Other (See Comments)    Intolerant, not allergic.   . Angiotensin Receptor Blockers Other (See Comments)    cramps  . Aripiprazole Other (See Comments)    Unknown reaction many years ago  . Crestor [Rosuvastatin Calcium] Other (See Comments)    Aches, even with twice weekly dosing  . Losartan Other (See Comments)    Leg cramps  . Metformin And Related  Other (See Comments)    Intolerant, not allergic.   . Olanzapine Other (See Comments)    Unknown reaction many years ago   . Other Cough    Blood pressure medication caused a cough after one dose  . Pneumovax 23 [Pneumococcal Vac Polyvalent] Other (See Comments)    aches  . Primidone Other (See Comments)    Unknown allergic reaction many years ago   . Bactrim [Sulfamethoxazole-Trimethoprim] Other (See Comments)    Mild reaction per Dr. Silvio Pate, pt does not remember the reaction  . Codeine Phosphate Rash  . Divalproex Sodium Other (See Comments)     tremor  . Lithium Carbonate Other (See Comments)    Tremors     Outpatient Encounter Prescriptions as of 01/06/2017  Medication Sig  . canagliflozin (INVOKANA) 300 MG TABS tablet Take by mouth 0.5-1 tablet a day  . Insulin Glargine (LANTUS SOLOSTAR) 100 UNIT/ML Solostar Pen TAKE UP TO 50 UNITS DAILY AS DIRECTED  . metFORMIN (GLUCOPHAGE-XR) 500 MG 24 hr tablet Take 1 tablet (500 mg total) by mouth 2 (two) times daily with a meal.  . Multiple Vitamin (MULTIVITAMIN) tablet Take 1 tablet by mouth daily.  Marland Kitchen oxyCODONE-acetaminophen (PERCOCET/ROXICET) 5-325 MG tablet Take 1 tablet by mouth every 6 (six) hours as needed (pain).  . propranolol (INDERAL) 10 MG tablet Take 2 tablets (20 mg total)  by mouth 2 (two) times daily. (Patient taking differently: Take by mouth. 2 tablets in the morning, 1 tablet at night)  . traZODone (DESYREL) 50 MG tablet TAKE 1 AND 1/2 TABLETS AT BEDTIME AS NEEDED FOR SLEEP  . [DISCONTINUED] glucose blood test strip Use as instructed to test blood sugar 4 times daily, dx: 250.02  . [DISCONTINUED] Insulin Pen Needle (CAREFINE PEN NEEDLES) 32G X 4 MM MISC Use 1x a day  . [DISCONTINUED] ranitidine (ZANTAC) 150 MG tablet Take 1 tablet (150 mg total) by mouth daily. (Patient not taking: Reported on 10/23/2016)  . [DISCONTINUED] TURMERIC PO Take 1 tablet by mouth daily. Turmeric Curcumin with Bioperine 1,000 mg    No  facility-administered encounter medications on file as of 01/06/2017.     Past Medical History:  Diagnosis Date  . Basal cell carcinoma    multiple  . Cataracts, both eyes   . Complication of anesthesia    post-operative cognitive dysfunction after ACDF on 05/01/11 [facility])  . 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 Diabetes mellitus   . DJD (degenerative joint disease)    lumbar spine  . Hearing impaired    hearing aids-transmitter rt   . History of blood transfusion    as a baby in 9  . History of colonic polyps   . Hyperlipidemia   . Hypertension    pt. denies high blood pressure  . Kidney stone 1967  . Nose fracture    3x  . OCD (obsessive compulsive disorder)   . Stroke [facility])    "mini stroke, that's what gave me the tremors"  . Stuttering   . Tremor, essential   . Wears glasses     Past Surgical History:  Procedure Laterality Date  . APPENDECTOMY  1960  . BACK SURGERY  2012   lumbar surgery  . CERVICAL FUSION  1990  . CERVICAL FUSION  8/12   multiple levels with plates  . CHOLESTEATOMA EXCISION  1993  . COLLATERAL LIGAMENT REPAIR, ELBOW     bilateral, nerve improvement left 08/04, right 09/04  . COLONOSCOPY    . CYST EXCISION     back of cervical spine total of 5 surgeries  . EXTERNAL EAR SURGERY     multiple ear surguries as a child  . OTHER SURGICAL HISTORY     benign head tumor#5  . OTHER SURGICAL HISTORY     sebaceous cysts-post neck x5  . SHOULDER ARTHROSCOPY W/ ACROMIAL REPAIR  08/2004   left shoulder  . TONSILLECTOMY    . ULNAR NERVE TRANSPOSITION Right 05/13/2013   Procedure: RIGHT ULNAR NERVE DECOMPRESSION/TRANSPOSITION;  Surgeon: [name]., MD;  Location: [place];  Service: Orthopedics;  Laterality: Right;    Social History   Social History  . Marital status: Married    Spouse name: N/A  . Number of children: 1  . Years of education: N/A   Occupational History  . disabled Retired    Dealer   Social  History Main Topics  . Smoking status: Former Smoker    Packs/day: 0.50    Years: 40.00    Types: Pipe    Quit date: 06/09/2016  . Smokeless tobacco: Never Used  . Alcohol use No  . Drug use: No  . Sexual activity: No   Other Topics Concern  . Not on file   Social History Narrative   Married, 1992   1 biological child, some contact as of 2017  Wife has 2 kids, no contact as of 2017   Prev worked as Designer, television/film set, Lobbyist, etc    Family Status  Relation Status  . Mother Deceased  . Sister Alive  . Sister Alive  . Father Deceased  . Son Alive  . Neg Hx     Review of Systems A complete 10 system ROS was obtained and was negative apart from what is mentioned.   Objective:   VITALS:   Vitals:   01/06/17 0941  BP: 140/90  Pulse: 72  Weight: 177 lb (80.3 kg)  Height: 5' 7.5" (1.715 m)   Gen:  Appears stated age and in NAD. HEENT:  Normocephalic, atraumatic. The mucous membranes are moist. The superficial temporal arteries are without ropiness or tenderness. Cardiovascular: Regular rate and rhythm. Lungs: Clear to auscultation bilaterally. Neck: There are no carotid bruits noted bilaterally.  NEUROLOGICAL:  Orientation:  The patient is alert and oriented x 3.  Recent and remote memory are intact.  Attention span and concentration are normal.  Able to name objects and repeat without trouble.  Fund of knowledge is appropriate Cranial nerves: There is good facial symmetry. The pupils are equal round and reactive to light bilaterally. Fundoscopic exam reveals clear disc margins bilaterally. Extraocular muscles are intact and visual fields are full to confrontational testing. Speech is clear and he intermittently has a stuttering quality to the speech. Soft palate rises symmetrically and there is no tongue deviation. Hearing is intact to conversational tone. Tone: Tone is good throughout. Sensation: Sensation is intact to light touch and pinprick throughout  (facial, trunk, extremities). Vibration is decreased but intact at the bilateral big toe. There is no extinction with double simultaneous stimulation. There is no sensory dermatomal level identified. Coordination:  The patient has no dysdiadichokinesia or dysmetria. Motor: Strength is 5/5 in the bilateral upper and lower extremities.  Shoulder shrug is equal bilaterally.  There is no pronator drift.  There are no fasciculations noted. DTR's: Deep tendon reflexes are 2-/4 at the bilateral biceps, triceps, brachioradialis, patella and achilles.  Plantar responses are downgoing bilaterally. Gait and Station: The patient is able to ambulate without difficulty.   MOVEMENT EXAM: Tremor:  There is mild to moderate tremor of the outstretched hands that increases with intention.  When given a weight in the right hand, tremor is actually better, although it is somewhat worse in the left hand.  He is virtually unable to do Archimedes spirals with the right hand because he has difficulty getting the pen to the paper.  It is better with the left hand.  He spills water all over with evidence of severe tremor when asked to pour water from one glass to another.  This is true when water is in either hand.       Assessment/Plan:   1.  Severe essential Tremor.  -This is evidenced by the symmetrical nature and longstanding hx of gradually getting worse.  We discussed nature and pathophysiology.  We discussed that this can continue to gradually get worse with time.  We discussed that some medications can worsen this, as can caffeine use.  We discussed medication therapy as well as surgical therapy.  Ultimately, the patient decided to increase Inderal and change to LA.  Will do inderal LA 60 mg.  discussed risks, benefits, and side effects  -no topamax due to nephrolithiasis (although years ago)  -will get records from Dr. Erling Cruz as primidone on allergy list but patient doesn't remember trying it  -  Talked to him about DBS  therapy and showed him videos of patients.  Talked to him about indications and contraindications.  He would need neurocognitive screening.  He has multiple antipsychotics on his allergy list, but I was unable to understand what his psychiatric diagnosis is, although I did ask.  I did tell him that he likely will not get a good control of tremor without surgical intervention, but I also told him I was not sure if he would be a surgical candidate and, like all other surgical candidate, would need screening.  He stated that Dr. Erling Cruz told him years ago that this is not a surgery he recommended for any patient, so the patient has always been leery of it.  2.  Follow up is anticipated in the next few months, sooner should new neurologic issues arise.  Much greater than 50% of this visit was spent in counseling and coordinating care.  Total face to face time:  60 min  Addendum:  Received Dr. loves notes from 03/04/2012.  Dr. love does indicate that the patient has a history of bipolar disease and he was on Lamictal at the time.  He was also on propranolol, 10 mg, 2-3 times per day and Dr. love changed him to Inderal LA, 80 mg per day.  He does state in the notes that he would consider the addition of primidone, but no other mention is made of primidone in his notes.  Not sure if this was perhaps added over the telephone.    CC:  Elsie Stain, MD

## 2017-01-06 ENCOUNTER — Encounter: Payer: Self-pay | Admitting: Neurology

## 2017-01-06 ENCOUNTER — Ambulatory Visit (INDEPENDENT_AMBULATORY_CARE_PROVIDER_SITE_OTHER): Payer: PPO | Admitting: Neurology

## 2017-01-06 VITALS — BP 140/90 | HR 72 | Ht 67.5 in | Wt 177.0 lb

## 2017-01-06 DIAGNOSIS — G250 Essential tremor: Secondary | ICD-10-CM

## 2017-01-06 MED ORDER — PROPRANOLOL HCL ER 60 MG PO CP24: 60.0000 mg | ORAL_CAPSULE | Freq: Every day | ORAL | 1 refills | Status: DC

## 2017-01-08 ENCOUNTER — Ambulatory Visit: Payer: PPO | Admitting: Family Medicine

## 2017-01-09 ENCOUNTER — Ambulatory Visit (INDEPENDENT_AMBULATORY_CARE_PROVIDER_SITE_OTHER): Payer: PPO | Admitting: Family Medicine

## 2017-01-09 ENCOUNTER — Encounter: Payer: Self-pay | Admitting: *Deleted

## 2017-01-09 ENCOUNTER — Encounter: Payer: Self-pay | Admitting: Family Medicine

## 2017-01-09 DIAGNOSIS — F410 Panic disorder [episodic paroxysmal anxiety] without agoraphobia: Secondary | ICD-10-CM | POA: Diagnosis not present

## 2017-01-09 MED ORDER — TRAZODONE HCL 50 MG PO TABS: ORAL_TABLET | ORAL | 3 refills | Status: DC

## 2017-01-09 MED ORDER — HYDROXYZINE HCL 10 MG PO TABS
10.0000 mg | ORAL_TABLET | Freq: Every day | ORAL | 0 refills | Status: DC | PRN
Start: 2017-01-09 — End: 2017-03-27

## 2017-01-09 NOTE — Progress Notes (Signed)
Pre visit review using our clinic review tool, if applicable. No additional management support is needed unless otherwise documented below in the visit note. 
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
## 2017-01-09 NOTE — Patient Instructions (Signed)
Try hydroxyzine in the meantime.  That is likely the safest option at this point.  Is that isn't tolerable or effective, then let me know.  Take care.  Glad to see you.

## 2017-01-10 ENCOUNTER — Ambulatory Visit: Admission: RE | Admit: 2017-01-10 | Payer: PPO | Source: Ambulatory Visit | Admitting: Gastroenterology

## 2017-01-10 ENCOUNTER — Encounter: Admission: RE | Payer: Self-pay | Source: Ambulatory Visit

## 2017-01-10 DIAGNOSIS — F410 Panic disorder [episodic paroxysmal anxiety] without agoraphobia: Secondary | ICD-10-CM | POA: Insufficient documentation

## 2017-01-10 SURGERY — COLONOSCOPY WITH PROPOFOL
Anesthesia: General

## 2017-01-10 NOTE — Assessment & Plan Note (Signed)
Episodic symptoms. Needs abortive treatment. Did not feel like he was needing to start daily prophylactic medication. Use hydroxyzine as needed. Routine cautions given. Update me as needed. We will monitor try this instead of a benzodiazepine at this point. He agrees.

## 2017-01-14 DIAGNOSIS — Z85828 Personal history of other malignant neoplasm of skin: Secondary | ICD-10-CM | POA: Diagnosis not present

## 2017-01-14 DIAGNOSIS — L905 Scar conditions and fibrosis of skin: Secondary | ICD-10-CM | POA: Diagnosis not present

## 2017-01-14 DIAGNOSIS — R21 Rash and other nonspecific skin eruption: Secondary | ICD-10-CM | POA: Diagnosis not present

## 2017-01-14 DIAGNOSIS — T819XXA Unspecified complication of procedure, initial encounter: Secondary | ICD-10-CM | POA: Diagnosis not present

## 2017-01-14 DIAGNOSIS — L570 Actinic keratosis: Secondary | ICD-10-CM | POA: Diagnosis not present

## 2017-01-16 ENCOUNTER — Encounter: Payer: Self-pay | Admitting: Family Medicine

## 2017-01-21 ENCOUNTER — Ambulatory Visit (INDEPENDENT_AMBULATORY_CARE_PROVIDER_SITE_OTHER): Payer: PPO | Admitting: Internal Medicine

## 2017-01-21 ENCOUNTER — Encounter: Payer: Self-pay | Admitting: Internal Medicine

## 2017-01-21 VITALS — BP 130/82 | HR 64 | Wt 180.0 lb

## 2017-01-21 DIAGNOSIS — E1142 Type 2 diabetes mellitus with diabetic polyneuropathy: Secondary | ICD-10-CM | POA: Diagnosis not present

## 2017-01-21 DIAGNOSIS — E1165 Type 2 diabetes mellitus with hyperglycemia: Secondary | ICD-10-CM | POA: Diagnosis not present

## 2017-01-21 DIAGNOSIS — IMO0002 Reserved for concepts with insufficient information to code with codable children: Secondary | ICD-10-CM

## 2017-01-21 LAB — POCT GLYCOSYLATED HEMOGLOBIN (HGB A1C): Hemoglobin A1C: 7.3

## 2017-01-21 NOTE — Patient Instructions (Signed)
Please continue: - Lantus 32 units at bedtime  - Invokana 150 daily in am - Metformin ER 500 mg 2x a day  Please let me know if the sugars are consistently <80 or >200.  Please return in 3 months with your sugar log.

## 2017-01-21 NOTE — Progress Notes (Signed)
Patient ID: Walter Harris, male   DOB: 06-30-1946, 71 y.o.   MRN: [id number]   HPI: Walter Harris is a 71 y.o.-year-old male,  DM2, dx in ~2007, insulin-dependent soon after dx, uncontrolled, with complications (PN). He is here with his wife offers part of the history. Last visit 3 months ago.  Before last visit, he started to improve his diet and to walk with his wife. He lost 7 pounds in 1.5 months. His HbA1c improved to 6.8%. Now not walking as he has worsening back pain. He also appears more anxious - got lost today in our building and then in the parking garage.   He had skin squamous cell CA  - taken out from arms >> developed an allergic rxn to sutures.  He was supposed to have a colonoscopy >> had to stop DM meds for 3 days >> this was then postponed! His sugars did not come down completely afterwards.  Last hemoglobin A1c was: Lab Results  Component Value Date   HGBA1C 6.8 10/23/2016   HGBA1C 7.2 (H) 07/22/2016   HGBA1C 7.9 (H) 03/14/2016   Pt is on a regimen of: - Lantus 32 >> 35 >> 30 >> 32 units at bedtime  - Invokana 150 (1/2 of 300 mg) daily in am - Metformin ER 500 mg 2x a day with meals >> he tolerates this well He stopped Metformin >> GI sx  He stopped Actos >> mm cramps, generalized pain.  Pt checks his sugars 1-2x a day and they are: - am: 87-112, 127 >> 73-111, 133 >> 68 x1, 86-130, 169 - 2h after b'fast: 140-180 >> 106-133 >> 122, 128 - before lunch: n/c >> 104-144 >> 112-153 - 2h after lunch: n/c >> 106-162, 186 >> 133-172 - before dinner: n/c >> 98-106, 177 >> 91-140, 152 - 2h after dinner: n/c >> 139-184 >> 143-181 - bedtime: n/c >> 106-141-156 >> 142-177 - nighttime: n/c >> 99-116 No lows. Lowest sugar was 87 >> 68; he has hypoglycemia awareness at ~70.  Highest sugar was 190 >> 181.  Glucometer: [employer]. He obtains strips on [employer] ($11 versus $60 in the pharmacy).   Pt's meals are: - Breakfast: oatmeal, bacon + 1 egg, toast - Lunch: sandwich or  soup - Dinner: meat + 2 veggies - Snacks: 2 or 3    He cannot exercise because of back pain.  - He does not have CKD, last BUN/creatinine:  Lab Results  Component Value Date   BUN 12 08/08/2016   BUN 12 07/22/2016   CREATININE 0.90 10/09/2016   CREATININE 1.05 08/08/2016   - last set of lipids: Lab Results  Component Value Date   CHOL 210 (H) 03/14/2016   HDL 42.80 03/14/2016   LDLCALC 143 (H) 03/14/2016   LDLDIRECT 160.2 06/01/2012   TRIG 121.0 03/14/2016   CHOLHDL 5 03/14/2016   - last eye exam was 06/07/2016. No DR - He denies numbness and tingling in his feet.  ROS: Constitutional: no weight gain/no weight loss, no fatigue, no subjective hyperthermia, no subjective hypothermia Eyes: no blurry vision, no xerophthalmia ENT: no sore throat, no nodules palpated in throat, no dysphagia, no odynophagia, no hoarseness Cardiovascular: no CP/no SOB/no palpitations/no leg swelling Respiratory: no cough/no SOB/no wheezing Gastrointestinal: no N/no V/no D/no C/no acid reflux Musculoskeletal: no muscle aches/no joint aches Skin: no rashes, no hair loss Neurological: + tremors/no numbness/no tingling/no dizziness  I reviewed pt's medications, allergies, PMH, social hx, family hx, and changes were documented in  the history of present illness. Otherwise, unchanged from my initial visit note.  Past Medical History:  Diagnosis Date  . Basal cell carcinoma    multiple  . Cataracts, both eyes   . Complication of anesthesia    post-operative cognitive dysfunction after ACDF on 05/01/11 [facility])  . 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 Diabetes mellitus   . DJD (degenerative joint disease)    lumbar spine  . Hearing impaired    hearing aids-transmitter rt   . History of blood transfusion    as a baby in 63  . History of colonic polyps   . Hyperlipidemia   . Hypertension    pt. denies high blood pressure  . Kidney stone 1967  . Nose fracture    3x  . OCD (obsessive  compulsive disorder)   . Stroke [facility])    "mini stroke, that's what gave me the tremors"  . Stuttering   . Tremor, essential   . Wears glasses    Past Surgical History:  Procedure Laterality Date  . APPENDECTOMY  1960  . BACK SURGERY  2012   lumbar surgery  . CERVICAL FUSION  1990  . CERVICAL FUSION  8/12   multiple levels with plates  . CHOLESTEATOMA EXCISION  1993  . COLLATERAL LIGAMENT REPAIR, ELBOW     bilateral, nerve improvement left 08/04, right 09/04  . COLONOSCOPY    . CYST EXCISION     back of cervical spine total of 5 surgeries  . EXTERNAL EAR SURGERY     multiple ear surguries as a child  . OTHER SURGICAL HISTORY     benign head tumor#5  . OTHER SURGICAL HISTORY     sebaceous cysts-post neck x5  . SHOULDER ARTHROSCOPY W/ ACROMIAL REPAIR  08/2004   left shoulder  . TONSILLECTOMY    . ULNAR NERVE TRANSPOSITION Right 05/13/2013   Procedure: RIGHT ULNAR NERVE DECOMPRESSION/TRANSPOSITION;  Surgeon: [name]., MD;  Location: [facility];  Service: Orthopedics;  Laterality: Right;   Social History   Social History  . Marital status: Married    Spouse name: N/A  . Number of children: 1  . Years of education: N/A   Occupational History  . disabled Retired    Dealer   Social History Main Topics  . Smoking status: Former Smoker    Packs/day: 0.50    Years: 40.00    Types: Pipe    Quit date: 06/09/2016  . Smokeless tobacco: Never Used  . Alcohol use No  . Drug use: No  . Sexual activity: No   Other Topics Concern  . Not on file   Social History Narrative   Married, 1992   1 biological child, some contact as of 2017   Wife has 2 kids, no contact as of 2017   Prev worked as Designer, television/film set, Lobbyist, etc   Current Outpatient Prescriptions on File Prior to Visit  Medication Sig Dispense Refill  . canagliflozin (INVOKANA) 300 MG TABS tablet Take by mouth 0.5-1 tablet a day 90 tablet 3  . dicyclomine (BENTYL) 10 MG  capsule Take 10 mg by mouth 4 (four) times daily -  before meals and at bedtime.    . hydrOXYzine (ATARAX/VISTARIL) 10 MG tablet Take 1-2 tablets (10-20 mg total) by mouth daily as needed for anxiety. 30 tablet 0  . Insulin Glargine (LANTUS SOLOSTAR) 100 UNIT/ML Solostar Pen TAKE UP TO 50 UNITS DAILY AS DIRECTED 15  mL 5  . metFORMIN (GLUCOPHAGE-XR) 500 MG 24 hr tablet Take 1 tablet (500 mg total) by mouth 2 (two) times daily with a meal. 180 tablet 1  . Multiple Vitamin (MULTIVITAMIN) tablet Take 1 tablet by mouth daily.    Marland Kitchen oxyCODONE-acetaminophen (PERCOCET/ROXICET) 5-325 MG tablet Take 1 tablet by mouth every 6 (six) hours as needed (pain). 60 tablet 0  . propranolol ER (INDERAL LA) 60 MG 24 hr capsule Take 1 capsule (60 mg total) by mouth daily. 90 capsule 1  . traZODone (DESYREL) 50 MG tablet TAKE 1 AND 1/2 TABLETS AT BEDTIME AS NEEDED FOR SLEEP 135 tablet 3  . ranitidine (ZANTAC) 150 MG tablet Take 150 mg by mouth 2 (two) times daily.     No current facility-administered medications on file prior to visit.    Allergies  Allergen Reactions  . Bee Venom Anaphylaxis  . Pravastatin Other (See Comments)    Severe myalgias  . Ace Inhibitors     Cough  . Actos [Pioglitazone] Other (See Comments)    Intolerant, not allergic.   . Angiotensin Receptor Blockers Other (See Comments)    cramps  . Aripiprazole Other (See Comments)    Unknown reaction many years ago  . Crestor [Rosuvastatin Calcium] Other (See Comments)    Aches, even with twice weekly dosing  . Losartan Other (See Comments)    Leg cramps  . Metformin And Related Other (See Comments)    Intolerant, not allergic.   . Olanzapine Other (See Comments)    Unknown reaction many years ago   . Other Cough    Blood pressure medication caused a cough after one dose  . Pneumovax 23 [Pneumococcal Vac Polyvalent] Other (See Comments)    aches  . Primidone Other (See Comments)    Unknown allergic reaction many years ago   .  Rosuvastatin Calcium   . Bactrim [Sulfamethoxazole-Trimethoprim] Other (See Comments)    Mild reaction per Dr. Silvio Pate, pt does not remember the reaction  . Codeine Phosphate Rash  . Divalproex Sodium Other (See Comments)     tremor  . Lithium Carbonate Other (See Comments)    Tremors    Family History  Problem Relation Age of Onset  . OCD Mother   . Bipolar disorder Mother   . Cancer Mother   . 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 Arthritis Sister   . OCD Sister   . Colon cancer Neg Hx   . Prostate cancer Neg Hx    PE: BP 130/82 (BP Location: Left Arm, Patient Position: Sitting)   Pulse 64   Wt 180 lb (81.6 kg)   SpO2 98%   BMI 27.78 kg/m  Wt Readings from Last 3 Encounters:  01/21/17 180 lb (81.6 kg)  01/09/17 178 lb 4 oz (80.9 kg)  01/06/17 177 lb (80.3 kg)   Constitutional: overweight, in NAD Eyes: PERRLA, EOMI, no exophthalmos ENT: moist mucous membranes, no thyromegaly, no cervical lymphadenopathy Cardiovascular: RRR, No MRG Respiratory: CTA B Gastrointestinal: abdomen soft, NT, ND, BS+ Musculoskeletal: no deformities, strength intact in all 4 Skin: moist, warm, no rashes Neurological: + tremor with outstretched hands (predominantly intentional tremor), DTR normal in all 4  ASSESSMENT: 1. DM2, insulin-dependent, uncontrolled, with complications - PN  PLAN:  1. Patient with long-standing, uncontrolled diabetes, on basal insulin + oral medications, with good control at last visit after he started to walk more and control his diet. At this visit,  sugars are higher, but he had some stress at home (son told pt. he does not want to have anything to do with him anymore) and also had some medical issues that affected him. We discussed that he needs to restart walking as that greatly helped, even 1 mi a day.   - No changes in regimen for now - I suggested to:  Patient Instructions  Please continue: - Lantus 32 units at bedtime  - Invokana  150 daily in am - Metformin ER 500 mg 2x a day  Please let me know if the sugars are consistently <80 or >200.  Please return in 3 months with your sugar log.   - today, HbA1c is 7.3% (higher) - continue checking sugars at different times of the day - check 2x a day, rotating checks - advised for yearly eye exams >> he is UTD - Return to clinic in 3 mo with sugar log   Walter Kingdom, MD PhD [facility] Endocrinology

## 2017-01-24 ENCOUNTER — Encounter: Payer: Self-pay | Admitting: Neurology

## 2017-01-24 ENCOUNTER — Encounter: Payer: Self-pay | Admitting: Internal Medicine

## 2017-01-24 NOTE — Telephone Encounter (Signed)
Patient canceled appt for 4.16.18. Records will be placed in records review folder.

## 2017-01-25 ENCOUNTER — Encounter: Payer: Self-pay | Admitting: Neurology

## 2017-01-27 DIAGNOSIS — A319 Mycobacterial infection, unspecified: Secondary | ICD-10-CM | POA: Diagnosis not present

## 2017-02-03 ENCOUNTER — Encounter: Payer: Self-pay | Admitting: Neurology

## 2017-02-04 ENCOUNTER — Encounter: Payer: Self-pay | Admitting: Family Medicine

## 2017-02-08 ENCOUNTER — Encounter: Payer: Self-pay | Admitting: Family Medicine

## 2017-02-10 ENCOUNTER — Encounter: Payer: Self-pay | Admitting: Family Medicine

## 2017-02-10 ENCOUNTER — Ambulatory Visit (INDEPENDENT_AMBULATORY_CARE_PROVIDER_SITE_OTHER): Payer: PPO | Admitting: Family Medicine

## 2017-02-10 DIAGNOSIS — H6980 Other specified disorders of Eustachian tube, unspecified ear: Secondary | ICD-10-CM | POA: Insufficient documentation

## 2017-02-10 DIAGNOSIS — H6982 Other specified disorders of Eustachian tube, left ear: Secondary | ICD-10-CM | POA: Diagnosis not present

## 2017-02-10 MED ORDER — FLUTICASONE PROPIONATE 50 MCG/ACT NA SUSP: 1.0000 | Freq: Every day | NASAL | 1 refills | Status: DC

## 2017-02-10 NOTE — Assessment & Plan Note (Signed)
Likely with patient having no sensation of TM movement with valsalva.  D/w pt.  Start flonase and he'll call ENT about f/u.  He agrees.  Steroid cautions given.

## 2017-02-10 NOTE — Patient Instructions (Signed)
Call Dr. Thornell Mule in the meantime.  Tell him you have decreased hearing in the left ear and that you have trouble popping the left ear.   He may want to see you in the next few days.   Start flonase in the meantime- you may have to adjust your insulin up in the meantime.  Take care.  Glad to see you.

## 2017-02-10 NOTE — Progress Notes (Signed)
He has derm f/u with [facility] tomorrow re: L forearm and R hand.   Taking 34 units of lantus once a day, adjusting his dose based on sugar.    Hearing troubles.  R ear hearing loss at baseline but L ear clearly different now, in the last 5 days.  It was a relatively quick change.  He has a whooshing sound/white noise in the L ear.  Some pitches come through better than others.  No cough. No FCNAVD.  He has f/u with ENT scheduled for 05/2017.  Some sinus congestion on the L maxillary area.    He can usually pop the L ear with valsalva but not recently.    Able to hear some, but decreased from baseline, in conversation.   Meds, vitals, and allergies reviewed.   ROS: Per HPI unless specifically indicated in ROS section   nad ncat Neither TM is red or bulging, neither canal with sig wax.   R TM with PE tube in place at baseline.  R ear no hearing, at baseline.  L ear with air>bone conduction.  L max sinus not ttp but L frontal sinus minimally ttp.  Nasal exam stuffy.  OP wnl Neck supple, no LA rrr

## 2017-02-11 ENCOUNTER — Encounter: Payer: Self-pay | Admitting: Family Medicine

## 2017-02-11 ENCOUNTER — Other Ambulatory Visit: Payer: Self-pay | Admitting: Internal Medicine

## 2017-02-11 DIAGNOSIS — A319 Mycobacterial infection, unspecified: Secondary | ICD-10-CM | POA: Diagnosis not present

## 2017-02-11 DIAGNOSIS — A318 Other mycobacterial infections: Secondary | ICD-10-CM | POA: Diagnosis not present

## 2017-02-11 DIAGNOSIS — L928 Other granulomatous disorders of the skin and subcutaneous tissue: Secondary | ICD-10-CM | POA: Diagnosis not present

## 2017-02-12 ENCOUNTER — Other Ambulatory Visit: Payer: Self-pay | Admitting: Ophthalmology

## 2017-02-12 DIAGNOSIS — G43109 Migraine with aura, not intractable, without status migrainosus: Secondary | ICD-10-CM | POA: Diagnosis not present

## 2017-02-12 DIAGNOSIS — H9192 Unspecified hearing loss, left ear: Secondary | ICD-10-CM

## 2017-02-12 DIAGNOSIS — G20 Parkinson's disease: Secondary | ICD-10-CM | POA: Diagnosis not present

## 2017-02-12 DIAGNOSIS — H6981 Other specified disorders of Eustachian tube, right ear: Secondary | ICD-10-CM | POA: Diagnosis not present

## 2017-02-12 DIAGNOSIS — I951 Orthostatic hypotension: Secondary | ICD-10-CM | POA: Diagnosis not present

## 2017-02-12 DIAGNOSIS — H539 Unspecified visual disturbance: Secondary | ICD-10-CM

## 2017-02-12 DIAGNOSIS — H903 Sensorineural hearing loss, bilateral: Secondary | ICD-10-CM | POA: Diagnosis not present

## 2017-02-12 LAB — HM DIABETES EYE EXAM

## 2017-02-17 ENCOUNTER — Encounter: Payer: Self-pay | Admitting: Family Medicine

## 2017-02-23 ENCOUNTER — Other Ambulatory Visit: Payer: Self-pay | Admitting: Family Medicine

## 2017-02-23 MED ORDER — PROPRANOLOL HCL 10 MG PO TABS: 20.0000 mg | ORAL_TABLET | Freq: Two times a day (BID) | ORAL | 5 refills | Status: DC

## 2017-02-24 ENCOUNTER — Encounter: Payer: Self-pay | Admitting: Neurology

## 2017-02-25 ENCOUNTER — Ambulatory Visit (INDEPENDENT_AMBULATORY_CARE_PROVIDER_SITE_OTHER): Payer: PPO | Admitting: Family Medicine

## 2017-02-25 ENCOUNTER — Encounter: Payer: Self-pay | Admitting: Family Medicine

## 2017-02-25 DIAGNOSIS — R251 Tremor, unspecified: Secondary | ICD-10-CM | POA: Diagnosis not present

## 2017-02-25 DIAGNOSIS — A319 Mycobacterial infection, unspecified: Secondary | ICD-10-CM | POA: Diagnosis not present

## 2017-02-25 DIAGNOSIS — Z85828 Personal history of other malignant neoplasm of skin: Secondary | ICD-10-CM | POA: Diagnosis not present

## 2017-02-25 MED ORDER — ALPRAZOLAM 0.5 MG PO TABS: 0.2500 mg | ORAL_TABLET | Freq: Two times a day (BID) | ORAL | 0 refills | Status: DC | PRN

## 2017-02-25 MED ORDER — PROPRANOLOL HCL 10 MG PO TABS: 20.0000 mg | ORAL_TABLET | Freq: Two times a day (BID) | ORAL | Status: DC

## 2017-02-25 NOTE — Progress Notes (Signed)
L arm bandaged from suture removal per dermatology.   Tremor had gotten worse recently.  It is worse when he gets anxious.  He was clearly worried about/by his derm procedures.   He'll have an episode where he gets more tremulous and "I come apart at the seams."  The harder he works to control it, the worse it gets.  He doesn't like to leave the house due to fear of an episode.   He has seen Dr. Catalina Pizza re: tremor.   He has sig family stressors with his children, he doesn't get to see his grandkids.  He has no contact with his son.  "And I can't do anything about it."  He had tapered and stopped xanax prev, per his choosing.    He tried to inc propranol but couldn't tolerate it.  20mg  BID is likely max tolerated dose for patient. D/w pt.   Meds, vitals, and allergies reviewed.   ROS: Per HPI unless specifically indicated in ROS section   GEN: nad, alert and oriented, speech is slowly but he is anxious appearing. He is able to regain composure. HEENT: mucous membranes moist NECK: supple w/o LA CV: rrr.  PULM: ctab, no inc wob ABD: soft, +bs EXT: no edema Tremor noted in the ext x4, symmetric

## 2017-02-25 NOTE — Patient Instructions (Signed)
Use the xanax as needed, the lowest dose possible and update me as needed.  Take care.  Glad to see you.

## 2017-02-26 NOTE — Assessment & Plan Note (Signed)
Likely anxiety related, anxiety exacerbated. He tapered off Xanax previously. He does have multiple stressors in his life. We talked about med options. Benzodiazepine caution specifically discussed. We noted he can tolerate Xanax based on his previous use, without adverse effect and without abuse prev. At this point restarting Xanax is probably the best option. Discussed with patient. Prescription given to patient. Start at half tab a day since he has been off the medication for a while, update me as needed. He agrees.

## 2017-02-27 ENCOUNTER — Ambulatory Visit: Payer: PPO

## 2017-02-27 ENCOUNTER — Encounter: Payer: Self-pay | Admitting: Family Medicine

## 2017-03-06 ENCOUNTER — Encounter: Payer: Self-pay | Admitting: Family Medicine

## 2017-03-10 ENCOUNTER — Encounter: Payer: Self-pay | Admitting: Neurology

## 2017-03-17 ENCOUNTER — Other Ambulatory Visit: Payer: Self-pay

## 2017-03-17 MED ORDER — OXYCODONE-ACETAMINOPHEN 5-325 MG PO TABS: 1.0000 | ORAL_TABLET | Freq: Four times a day (QID) | ORAL | 0 refills | Status: DC | PRN

## 2017-03-17 NOTE — Telephone Encounter (Signed)
Pt left v/m requesting rx oxycodone apap. Call when rx ready for pick up. Last printed # 60 on 12/27/16. Last seen 02/25/17.

## 2017-03-17 NOTE — Telephone Encounter (Signed)
Printed.  Thanks.  

## 2017-03-18 NOTE — Telephone Encounter (Signed)
Patient advised.  Rx left at front desk for pick up. 

## 2017-03-24 DIAGNOSIS — A319 Mycobacterial infection, unspecified: Secondary | ICD-10-CM | POA: Diagnosis not present

## 2017-03-25 NOTE — Progress Notes (Signed)
Subjective:   Walter Harris was seen in consultation in the movement disorder clinic at the request of [name], MD.   This patient is accompanied in the office by his spouse who supplements the history.  The evaluation is for tremor.  Tremor started in the early 1990's and it involved both hands. Tremor is most noticeable when he would try to do something.  It is worse when he uses the hands.   There is a family hx of tremor in both father and mother.  Saw Dr. Erling Cruz years ago and placed on inderal LA and doesn't know when it was changed to regular inderal but states dose cut down over the years.  Pt thinks that dose needs increased.  Affected by caffeine:  No. Affected by alcohol:  Doesn't drink EtOH Affected by stress:  Yes.   Affected by fatigue:  No. Spills soup if on spoon:  Yes.   (uses weighted utensils but doesn't help) Spills glass of liquid if full:  Yes.   Affects ADL's (tying shoes, brushing teeth, etc):  Yes.   (has to use Copy)  Current/Previously tried tremor medications: propranolol 20 in the AM, 10 at night  Current medications that may exacerbate tremor:  was exposed to antipsychotics for years but off for some time  Outside reports reviewed: lab reports, office notes and radiology reports.I reviewed his MRI of the brain from June, 2013.  The patient was status post right mastoidectomy.  There was atrophy and mild small vessel disease.  03/27/17 update:  Since our last visit, I got records from Dr. Bernardo Heater office dated 03/04/2012.  Dr. love does indicated that the patient has a history of bipolar disease and he was on Lamictal at the time.  Dr. Erling Cruz had the patient on Inderal LA, 80 mg per day.  Dr. Bernardo Heater notes do state that he would consider the addition of primidone, but no other mention is made of primidone in his notes.  Not sure if this was perhaps added over the telephone as the patient verbally indicates he was on this in the past and states that he  didn't tolerate it well.  In fact, he lists it under the allergy list but then says today that it isn't an allergy, he just didn't like the way it made him feel.   He states that he actually cannot remember what side effect he had.   Pt today states that he would like to re-try this medication.  States that he d/c the inderal LA 60 mg from last visit as he thinks that it increased tremor.  He is now only on inderal 10 mg, 3 po q day.  Emailed since our last visit about focused ultrasound.  Allergies  Allergen Reactions  . Bee Venom Anaphylaxis  . Pravastatin Other (See Comments)    Severe myalgias  . Ace Inhibitors     Cough  . Actos [Pioglitazone] Other (See Comments)    Intolerant, not allergic.   . Angiotensin Receptor Blockers Other (See Comments)    cramps  . Aripiprazole Other (See Comments)    Unknown reaction many years ago  . Crestor [Rosuvastatin Calcium] Other (See Comments)    Aches, even with twice weekly dosing  . Losartan Other (See Comments)    Leg cramps  . Metformin And Related Other (See Comments)    Intolerant, not allergic.   . Olanzapine Other (See Comments)    Unknown reaction many years ago   .  Other Cough    Blood pressure medication caused a cough after one dose  . Pneumovax 23 [Pneumococcal Vac Polyvalent] Other (See Comments)    aches  . Primidone Other (See Comments)    Unknown allergic reaction many years ago   . Rosuvastatin Calcium   . Bactrim [Sulfamethoxazole-Trimethoprim] Other (See Comments)    Mild reaction per Dr. Silvio Pate, pt does not remember the reaction  . Codeine Phosphate Rash  . Divalproex Sodium Other (See Comments)     tremor  . Lithium Carbonate Other (See Comments)    Tremors     Outpatient Encounter Prescriptions as of 03/27/2017  Medication Sig  . canagliflozin (INVOKANA) 300 MG TABS tablet Take by mouth 0.5-1 tablet a day  . Insulin Glargine (LANTUS SOLOSTAR) 100 UNIT/ML Solostar Pen TAKE UP TO 50 UNITS DAILY AS DIRECTED    . metFORMIN (GLUCOPHAGE-XR) 500 MG 24 hr tablet TAKE ONE TABLET TWICE DAILY  . Multiple Vitamin (MULTIVITAMIN) tablet Take 1 tablet by mouth daily.  Marland Kitchen oxyCODONE-acetaminophen (PERCOCET/ROXICET) 5-325 MG tablet Take 1 tablet by mouth every 6 (six) hours as needed (pain).  . propranolol (INDERAL) 10 MG tablet Take 2 tablets (20 mg total) by mouth 2 (two) times daily.  . traZODone (DESYREL) 50 MG tablet TAKE 1 AND 1/2 TABLETS AT BEDTIME AS NEEDED FOR SLEEP  . [DISCONTINUED] ALPRAZolam (XANAX) 0.5 MG tablet Take 0.5-1 tablets (0.25-0.5 mg total) by mouth 2 (two) times daily as needed for anxiety.  . [DISCONTINUED] fluticasone (FLONASE) 50 MCG/ACT nasal spray Place 1-2 sprays into both nostrils daily.  . [DISCONTINUED] hydrOXYzine (ATARAX/VISTARIL) 10 MG tablet Take 1-2 tablets (10-20 mg total) by mouth daily as needed for anxiety.  . [DISCONTINUED] minocycline (DYNACIN) 100 MG tablet Take 100 mg by mouth 2 (two) times daily.   No facility-administered encounter medications on file as of 03/27/2017.     Past Medical History:  Diagnosis Date  . Basal cell carcinoma    multiple  . Cataracts, both eyes   . Complication of anesthesia    post-operative cognitive dysfunction after ACDF on 05/01/11 [facility])  . 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 Diabetes mellitus   . DJD (degenerative joint disease)    lumbar spine  . Hearing impaired    hearing aids-transmitter rt   . History of blood transfusion    as a baby in 88  . History of colonic polyps   . Hyperlipidemia   . Hypertension    pt. denies high blood pressure  . Kidney stone 1967  . Nose fracture    3x  . OCD (obsessive compulsive disorder)   . Stroke [facility])    "mini stroke, that's what gave me the tremors"  . Stuttering   . Tremor, essential   . Wears glasses     Past Surgical History:  Procedure Laterality Date  . APPENDECTOMY  1960  . BACK SURGERY  2012   lumbar surgery  . CERVICAL FUSION  1990  . CERVICAL FUSION  8/12    multiple levels with plates  . CHOLESTEATOMA EXCISION  1993  . COLLATERAL LIGAMENT REPAIR, ELBOW     bilateral, nerve improvement left 08/04, right 09/04  . COLONOSCOPY    . CYST EXCISION     back of cervical spine total of 5 surgeries  . EXTERNAL EAR SURGERY     multiple ear surguries as a child  . OTHER SURGICAL HISTORY     benign head tumor#5  . OTHER  SURGICAL HISTORY     sebaceous cysts-post neck x5  . SHOULDER ARTHROSCOPY W/ ACROMIAL REPAIR  08/2004   left shoulder  . TONSILLECTOMY    . ULNAR NERVE TRANSPOSITION Right 05/13/2013   Procedure: RIGHT ULNAR NERVE DECOMPRESSION/TRANSPOSITION;  Surgeon: [name]., MD;  Location: [place];  Service: Orthopedics;  Laterality: Right;    Social History   Social History  . Marital status: Married    Spouse name: N/A  . Number of children: 1  . Years of education: N/A   Occupational History  . disabled Retired    Dealer   Social History Main Topics  . Smoking status: Former Smoker    Packs/day: 0.50    Years: 40.00    Types: Pipe    Quit date: 06/09/2016  . Smokeless tobacco: Never Used  . Alcohol use No  . Drug use: No  . Sexual activity: No   Other Topics Concern  . Not on file   Social History Narrative   Married, 1992   1 biological child, some contact as of 2017   Wife has 2 kids, no contact as of 2017   Prev worked as Designer, television/film set, Lobbyist, etc    Family Status  Relation Status  . Mother Deceased  . Sister Alive  . Sister Alive  . Father Deceased  . Son Alive  . Neg Hx (Not Specified)    Review of Systems A complete 10 system ROS was obtained and was negative apart from what is mentioned.   Objective:   VITALS:   Vitals:   03/27/17 1413  BP: 110/60  Pulse: 80  SpO2: 96%  Weight: 180 lb (81.6 kg)  Height: 5' 8.5" (1.74 m)   Gen:  Appears stated age and in NAD. HEENT:  Normocephalic, atraumatic. The mucous membranes are moist. The superficial  temporal arteries are without ropiness or tenderness. Cardiovascular: Regular rate and rhythm. Lungs: Clear to auscultation bilaterally. Neck: There are no carotid bruits noted bilaterally.  NEUROLOGICAL:  Orientation:  The patient is alert and oriented x 3.   Cranial nerves: There is good facial symmetry. Speech is clear and he intermittently has a stuttering quality to the speech. Soft palate rises symmetrically and there is no tongue deviation. Hearing is intact to conversational tone. Tone: Tone is good throughout. Sensation: Sensation is intact to light touch and pinprick throughout (facial, trunk, extremities). Vibration is decreased but intact at the bilateral big toe. There is no extinction with double simultaneous stimulation. There is no sensory dermatomal level identified. Coordination:  The patient has no dysdiadichokinesia or dysmetria. Motor: Strength is 5/5 in the bilateral upper and lower extremities.  Shoulder shrug is equal bilaterally.  There is no pronator drift.  There are no fasciculations noted. DTR's: Deep tendon reflexes are 2-/4 at the bilateral biceps, triceps, brachioradialis, patella and achilles.  Plantar responses are downgoing bilaterally. Gait and Station: The patient is able to ambulate without difficulty.   MOVEMENT EXAM: Tremor:  There is moderate tremor of the outstretched hands that increases with intention but the L is worse than the R. .  When given a weight in the right hand, tremor is actually better, although it is somewhat worse in the left hand.   He has tremor of both legs and the R leg tremor is worse than the L      Assessment/Plan:   1.  Severe essential Tremor.  -This is evidenced by the symmetrical nature and longstanding hx  of gradually getting worse.  We discussed nature and pathophysiology.  We discussed that this can continue to gradually get worse with time.  We discussed that some medications can worsen this, as can caffeine use.  We  discussed medication therapy as well as surgical therapy.  The patient felt that higher dosages of Inderal LA caused more tremor.  I'm not sure that that makes physiologic sense, but nonetheless we will not use more Inderal.  He is currently on Inderal, 10 mg, 3 tablets daily.  -As above, he was apparently on primidone years ago.  I could not find that within Dr. Bernardo Heater records.  It is on his allergy list.  The patient reports that he thinks he may have just felt bad on the medication, but cannot remember.  He would like to retry the medication.  We talked extensively about risks, benefits, and side effects.  We will retry and he will call me with any issues.  -Patient read about focused ultrasound.  We discussed this.  We discussed that this is generally secondary line to DBS therapy.  We discussed DBS therapy.  He admits that he is scared of DBS therapy.  We talked about both of these therapies in general.  2.  Follow up is anticipated in the next 3 months, sooner should new neurologic issues arise.  Much greater than 50% of this visit was spent in counseling and coordinating care.  Total face to face time:  25 min    CC:  [name], MD

## 2017-03-27 ENCOUNTER — Ambulatory Visit (INDEPENDENT_AMBULATORY_CARE_PROVIDER_SITE_OTHER): Payer: PPO | Admitting: Neurology

## 2017-03-27 ENCOUNTER — Encounter: Payer: Self-pay | Admitting: Neurology

## 2017-03-27 VITALS — BP 110/60 | HR 80 | Ht 68.5 in | Wt 180.0 lb

## 2017-03-27 DIAGNOSIS — G250 Essential tremor: Secondary | ICD-10-CM

## 2017-03-27 MED ORDER — PRIMIDONE 50 MG PO TABS: 50.0000 mg | ORAL_TABLET | Freq: Every day | ORAL | 1 refills | Status: DC

## 2017-03-27 NOTE — Patient Instructions (Signed)
Start primidone - 50 mg - 1/2 tablet at night for 4 nights and then increase to 1 tablet at night thereafter.  Call us with any concerns  Make a follow up for 3 months  Continue your propranolol as you are taking it

## 2017-04-10 ENCOUNTER — Encounter: Payer: Self-pay | Admitting: Neurology

## 2017-04-16 ENCOUNTER — Encounter: Payer: Self-pay | Admitting: Family Medicine

## 2017-04-18 ENCOUNTER — Other Ambulatory Visit: Payer: Self-pay | Admitting: Family Medicine

## 2017-04-18 ENCOUNTER — Encounter: Payer: Self-pay | Admitting: Family Medicine

## 2017-04-18 ENCOUNTER — Ambulatory Visit (INDEPENDENT_AMBULATORY_CARE_PROVIDER_SITE_OTHER): Payer: PPO | Admitting: Family Medicine

## 2017-04-18 VITALS — BP 152/86 | HR 76 | Temp 97.8°F | Wt 180.0 lb

## 2017-04-18 DIAGNOSIS — R251 Tremor, unspecified: Secondary | ICD-10-CM

## 2017-04-18 DIAGNOSIS — G8929 Other chronic pain: Secondary | ICD-10-CM

## 2017-04-18 DIAGNOSIS — R5383 Other fatigue: Secondary | ICD-10-CM

## 2017-04-18 DIAGNOSIS — E1142 Type 2 diabetes mellitus with diabetic polyneuropathy: Secondary | ICD-10-CM

## 2017-04-18 DIAGNOSIS — M549 Dorsalgia, unspecified: Secondary | ICD-10-CM

## 2017-04-18 DIAGNOSIS — E1165 Type 2 diabetes mellitus with hyperglycemia: Secondary | ICD-10-CM

## 2017-04-18 DIAGNOSIS — IMO0002 Reserved for concepts with insufficient information to code with codable children: Secondary | ICD-10-CM

## 2017-04-18 LAB — CBC WITH DIFFERENTIAL/PLATELET
BASOS ABS: 0 10*3/uL (ref 0.0–0.1)
Basophils Relative: 0.4 % (ref 0.0–3.0)
EOS ABS: 0.1 10*3/uL (ref 0.0–0.7)
Eosinophils Relative: 1.7 % (ref 0.0–5.0)
HEMATOCRIT: 46 % (ref 39.0–52.0)
Hemoglobin: 15.3 g/dL (ref 13.0–17.0)
LYMPHS PCT: 18.8 % (ref 12.0–46.0)
Lymphs Abs: 1.2 10*3/uL (ref 0.7–4.0)
MCHC: 33.3 g/dL (ref 30.0–36.0)
MCV: 93.6 fl (ref 78.0–100.0)
Monocytes Absolute: 0.6 10*3/uL (ref 0.1–1.0)
Monocytes Relative: 8.9 % (ref 3.0–12.0)
Neutro Abs: 4.6 10*3/uL (ref 1.4–7.7)
Neutrophils Relative %: 70.2 % (ref 43.0–77.0)
PLATELETS: 220 10*3/uL (ref 150.0–400.0)
RBC: 4.92 Mil/uL (ref 4.22–5.81)
RDW: 13.1 % (ref 11.5–15.5)
WBC: 6.6 10*3/uL (ref 4.0–10.5)

## 2017-04-18 LAB — TSH: TSH: 0.9 u[IU]/mL (ref 0.35–4.50)

## 2017-04-18 LAB — COMPREHENSIVE METABOLIC PANEL
ALBUMIN: 4.5 g/dL (ref 3.5–5.2)
ALK PHOS: 62 U/L (ref 39–117)
ALT: 17 U/L (ref 0–53)
AST: 17 U/L (ref 0–37)
BILIRUBIN TOTAL: 1 mg/dL (ref 0.2–1.2)
BUN: 15 mg/dL (ref 6–23)
CALCIUM: 9.5 mg/dL (ref 8.4–10.5)
CO2: 31 meq/L (ref 19–32)
CREATININE: 0.92 mg/dL (ref 0.40–1.50)
Chloride: 105 mEq/L (ref 96–112)
GFR: 86.35 mL/min (ref >60.00)
Glucose, Bld: 116 mg/dL — ABNORMAL HIGH (ref 70–99)
Potassium: 4.5 mEq/L (ref 3.5–5.1)
Sodium: 140 mEq/L (ref 135–145)
TOTAL PROTEIN: 6.9 g/dL (ref 6.0–8.3)

## 2017-04-18 LAB — VITAMIN B12: Vitamin B-12: 482 pg/mL (ref 211–911)

## 2017-04-18 MED ORDER — PRIMIDONE 50 MG PO TABS: 25.0000 mg | ORAL_TABLET | Freq: Every day | ORAL | Status: DC

## 2017-04-18 MED ORDER — PROPRANOLOL HCL 10 MG PO TABS: ORAL_TABLET | ORAL | Status: DC

## 2017-04-18 NOTE — Assessment & Plan Note (Signed)
Foot exam done today, see above.

## 2017-04-18 NOTE — Assessment & Plan Note (Signed)
Some better with primidone, continue as is.

## 2017-04-18 NOTE — Patient Instructions (Addendum)
Go to the lab on the way out.  We'll contact you with your lab report. Take care.  Glad to see you.  Update me as needed.   

## 2017-04-18 NOTE — Assessment & Plan Note (Signed)
With flare of neck pain, see above.

## 2017-04-18 NOTE — Progress Notes (Signed)
FH B12 d/w pt.  Mult family members with B12 def per patient report.  Fatigued.  "I'm work out."  He has family members who have been ill recently, that has been worrisome.    Tremor.  He has been able to tolerate 25mg  primidone at night, with some dec in tremor.    He has f/u with endo pending.    He had continued neck pain.  Can radiate down the R arm but not currently.  He hit his head on a cabinet accidentally and then the neck pain got worse thereafter.  No LOC.  He agrees to follow up with the spine clinic if needed.  He agrees.  He has been using heat and ice as needed.  He has oxycodone to use if needed for back pain, at baseline.    Meds, vitals, and allergies reviewed.   ROS: Per HPI unless specifically indicated in ROS section   GEN: nad, alert and oriented HEENT: mucous membranes moist NECK: supple w/o LA, paraspinal muscles ttp w/o rash or bruise CV: rrr. PULM: ctab, no inc wob ABD: soft, +bs EXT: no edema SKIN: no acute rash Less tremor one exam today.   S/S grossly wnl BUE  Diabetic foot exam: Normal inspection No skin breakdown B plantar calluses noted.  Normal DP pulses Normal sensation to light touch and monofilament Nails normal

## 2017-04-18 NOTE — Assessment & Plan Note (Signed)
Unclear source, mult stressors noted and d/w pt.  Check routine labs today.  See notes on labs.

## 2017-04-21 ENCOUNTER — Encounter: Payer: Self-pay | Admitting: Family Medicine

## 2017-04-21 ENCOUNTER — Encounter: Payer: Self-pay | Admitting: Neurology

## 2017-04-22 ENCOUNTER — Encounter: Payer: Self-pay | Admitting: *Deleted

## 2017-04-23 ENCOUNTER — Encounter: Payer: Self-pay | Admitting: Family Medicine

## 2017-04-25 ENCOUNTER — Ambulatory Visit: Payer: PPO | Admitting: Neurology

## 2017-05-02 ENCOUNTER — Encounter: Payer: Self-pay | Admitting: Internal Medicine

## 2017-05-02 ENCOUNTER — Ambulatory Visit (INDEPENDENT_AMBULATORY_CARE_PROVIDER_SITE_OTHER): Payer: PPO | Admitting: Internal Medicine

## 2017-05-02 VITALS — BP 140/90 | HR 70 | Ht 69.0 in | Wt 180.0 lb

## 2017-05-02 DIAGNOSIS — E785 Hyperlipidemia, unspecified: Secondary | ICD-10-CM | POA: Diagnosis not present

## 2017-05-02 DIAGNOSIS — E1165 Type 2 diabetes mellitus with hyperglycemia: Secondary | ICD-10-CM | POA: Diagnosis not present

## 2017-05-02 DIAGNOSIS — E1142 Type 2 diabetes mellitus with diabetic polyneuropathy: Secondary | ICD-10-CM | POA: Diagnosis not present

## 2017-05-02 DIAGNOSIS — IMO0002 Reserved for concepts with insufficient information to code with codable children: Secondary | ICD-10-CM

## 2017-05-02 LAB — POCT GLYCOSYLATED HEMOGLOBIN (HGB A1C): Hemoglobin A1C: 6.9

## 2017-05-02 MED ORDER — METFORMIN HCL ER 500 MG PO TB24: ORAL_TABLET | ORAL | 3 refills | Status: DC

## 2017-05-02 NOTE — Progress Notes (Signed)
Patient ID: Walter Harris, male   DOB: 02/22/1946, 71 y.o.   MRN: [id number]   HPI: Walter Harris is a 71 y.o.-year-old male,  DM2, dx in ~2007, insulin-dependent soon after dx, uncontrolled, with complications (PN). He is here with his wife offers part of the history. Last visit 3 months ago.  Since last visit >> joined [employer]. However, he stopped as his wife almost died (he found her unresponsive) from a GI blockage >> has been hospitalized. Now better.  He was restarted on Primidone as his tremors are worse. He does not feel this is helping.  Last hemoglobin A1c was: Lab Results  Component Value Date   HGBA1C 7.3 01/21/2017   HGBA1C 6.8 10/23/2016   HGBA1C 7.2 (H) 07/22/2016   Pt is on a regimen of: - Lantus 32 >> 35 >> 30 >> 31 units at bedtime - Invokana 150 (1/2 of 300 mg) daily in am - Metformin ER 500 mg 2x a day with meals >> he tolerates this well He stopped Metformin >> GI sx  He stopped Actos >> mm cramps, generalized pain.  Pt checks his sugars 1-2x a day and they are: - am: 87-112, 127 >> 73-111, 133 >> 68 x1, 86-130, 169 >> 71, 79, 82-117 - 2h after b'fast: 140-180 >> 106-133 >> 122, 128 >> 133-155 - before lunch: n/c >> 104-144 >> 112-153 >> 131-164, 176 - 2h after lunch: n/c >> 106-162, 186 >> 133-172 >> 157-179, 201 - before dinner: n/c >> 98-106, 177 >> 91-140, 152 >> 144-147 - 2h after dinner: n/c >> 139-184 >> 143-181 >> 161-186 - bedtime: n/c >> 106-141-156 >> 142-177 >> 140-163 - nighttime: n/c >> 99-116 >> n/c No lows. Lowest sugar was 87 >> 68 >> 71; he has hypoglycemia awareness at ~70.  Highest sugar was 190 >> 181 >> 201.  Glucometer: [employer]. He obtains strips on [employer] ($11 versus $60 in the pharmacy).   Pt's meals are: - Breakfast: oatmeal, bacon + 1 egg, toast - Lunch: sandwich or soup - Dinner: meat + 2 veggies - Snacks: 2 or 3    - No CKD, last BUN/creatinine:  Lab Results  Component Value Date   BUN 15 04/18/2017   BUN 12  08/08/2016   CREATININE 0.92 04/18/2017   CREATININE 0.90 10/09/2016   - + HL. last set of lipids: Lab Results  Component Value Date   CHOL 210 (H) 03/14/2016   HDL 42.80 03/14/2016   LDLCALC 143 (H) 03/14/2016   LDLDIRECT 160.2 06/01/2012   TRIG 121.0 03/14/2016   CHOLHDL 5 03/14/2016   - last eye exam was 02/2017 >> No DR.  - Denies numbness and tingling in his feet.  ROS: Constitutional: no weight gain/no weight loss, no fatigue, no subjective hyperthermia, no subjective hypothermia Eyes: no blurry vision, no xerophthalmia ENT: no sore throat, no nodules palpated in throat, no dysphagia, no odynophagia, no hoarseness Cardiovascular: no CP/no SOB/no palpitations/no leg swelling Respiratory: no cough/no SOB/no wheezing Gastrointestinal: no N/no V/no D/no C/no acid reflux Musculoskeletal: no muscle aches/no joint aches Skin: no rashes, no hair loss Neurological: +++ tremors/no numbness/no tingling/no dizziness  I reviewed pt's medications, allergies, PMH, social hx, family hx, and changes were documented in the history of present illness. Otherwise, unchanged from my initial visit note.   Past Medical History:  Diagnosis Date  . Basal cell carcinoma    multiple  . Cataracts, both eyes   . Complication of anesthesia    post-operative  cognitive dysfunction after ACDF on 05/01/11 [facility])  . 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 Diabetes mellitus   . DJD (degenerative joint disease)    lumbar spine  . Hearing impaired    hearing aids-transmitter rt   . History of blood transfusion    as a baby in 6  . History of colonic polyps   . Hyperlipidemia   . Hypertension    pt. denies high blood pressure  . Kidney stone 1967  . Nose fracture    3x  . OCD (obsessive compulsive disorder)   . Stroke [facility])    "mini stroke, that's what gave me the tremors"  . Stuttering   . Tremor, essential   . Wears glasses    Past Surgical History:  Procedure Laterality Date  .  APPENDECTOMY  1960  . BACK SURGERY  2012   lumbar surgery  . CERVICAL FUSION  1990  . CERVICAL FUSION  8/12   multiple levels with plates  . CHOLESTEATOMA EXCISION  1993  . COLLATERAL LIGAMENT REPAIR, ELBOW     bilateral, nerve improvement left 08/04, right 09/04  . COLONOSCOPY    . CYST EXCISION     back of cervical spine total of 5 surgeries  . EXTERNAL EAR SURGERY     multiple ear surguries as a child  . OTHER SURGICAL HISTORY     benign head tumor#5  . OTHER SURGICAL HISTORY     sebaceous cysts-post neck x5  . SHOULDER ARTHROSCOPY W/ ACROMIAL REPAIR  08/2004   left shoulder  . TONSILLECTOMY    . ULNAR NERVE TRANSPOSITION Right 05/13/2013   Procedure: RIGHT ULNAR NERVE DECOMPRESSION/TRANSPOSITION;  Surgeon: [name]., MD;  Location: [place];  Service: Orthopedics;  Laterality: Right;   Social History   Social History  . Marital status: Married    Spouse name: N/A  . Number of children: 1  . Years of education: N/A   Occupational History  . disabled Retired    Dealer   Social History Main Topics  . Smoking status: Former Smoker    Packs/day: 0.50    Years: 40.00    Types: Pipe    Quit date: 06/09/2016  . Smokeless tobacco: Never Used  . Alcohol use No  . Drug use: No  . Sexual activity: No   Other Topics Concern  . Not on file   Social History Narrative   Married, 1992   1 biological child, some contact as of 2017   Wife has 2 kids, no contact as of 2017   Prev worked as Designer, television/film set, Lobbyist, etc   Current Outpatient Prescriptions on File Prior to Visit  Medication Sig Dispense Refill  . canagliflozin (INVOKANA) 300 MG TABS tablet Take by mouth 0.5-1 tablet a day 90 tablet 3  . Insulin Glargine (LANTUS SOLOSTAR) 100 UNIT/ML Solostar Pen TAKE UP TO 50 UNITS DAILY AS DIRECTED 15 mL 5  . metFORMIN (GLUCOPHAGE-XR) 500 MG 24 hr tablet TAKE ONE TABLET TWICE DAILY 180 tablet 1  . Multiple Vitamin (MULTIVITAMIN)  tablet Take 1 tablet by mouth daily.    Marland Kitchen oxyCODONE-acetaminophen (PERCOCET/ROXICET) 5-325 MG tablet Take 1 tablet by mouth every 6 (six) hours as needed (pain). 60 tablet 0  . propranolol (INDERAL) 10 MG tablet 2 tabs in the AM and 1 in the PM    . PSYLLIUM FIBER PO Take 1 Dose by mouth daily.    . traZODone (DESYREL)  50 MG tablet TAKE 1 AND 1/2 TABLETS AT BEDTIME AS NEEDED FOR SLEEP 135 tablet 3   No current facility-administered medications on file prior to visit.    Allergies  Allergen Reactions  . Bee Venom Anaphylaxis  . Pravastatin Other (See Comments)    Severe myalgias  . Ace Inhibitors     Cough  . Actos [Pioglitazone] Other (See Comments)    Intolerant, not allergic.   . Angiotensin Receptor Blockers Other (See Comments)    cramps  . Aripiprazole Other (See Comments)    Unknown reaction many years ago  . Crestor [Rosuvastatin Calcium] Other (See Comments)    Aches, even with twice weekly dosing  . Losartan Other (See Comments)    Leg cramps  . Metformin And Related Other (See Comments)    Intolerant, not allergic.   . Olanzapine Other (See Comments)    Unknown reaction many years ago   . Other Cough    Blood pressure medication caused a cough after one dose  . Pneumovax 23 [Pneumococcal Vac Polyvalent] Other (See Comments)    aches  . Primidone Other (See Comments)    Vertigo at 50mg , able to tolerate 25mg    . Rosuvastatin Calcium   . Bactrim [Sulfamethoxazole-Trimethoprim] Other (See Comments)    Mild reaction per Dr. Silvio Pate, pt does not remember the reaction  . Codeine Phosphate Rash  . Divalproex Sodium Other (See Comments)     tremor  . Lithium Carbonate Other (See Comments)    Tremors    Family History  Problem Relation Age of Onset  . OCD Mother   . Bipolar disorder Mother   . Cancer Mother   . 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 Arthritis Sister   . OCD Sister   . Colon cancer Neg Hx   . Prostate cancer Neg Hx     PE: BP 140/90 (BP Location: Left Arm, Patient Position: Sitting)   Pulse 70   Ht 5\' 9"  (1.753 m)   Wt 180 lb (81.6 kg)   SpO2 93%   BMI 26.58 kg/m  Wt Readings from Last 3 Encounters:  05/02/17 180 lb (81.6 kg)  04/18/17 180 lb (81.6 kg)  03/27/17 180 lb (81.6 kg)   Constitutional: overweight, in NAD Eyes: PERRLA, EOMI, no exophthalmos ENT: moist mucous membranes, no thyromegaly, no cervical lymphadenopathy Cardiovascular: RRR, No MRG Respiratory: CTA B Gastrointestinal: abdomen soft, NT, ND, BS+ Musculoskeletal: no deformities, strength intact in all 4 Skin: moist, warm, no rashes Neurological: + tremor with outstretched hands, leg, and also stuttering, DTR normal in all 4   ASSESSMENT: 1. DM2, insulin-dependent, uncontrolled, with complications - PN  2. HL  PLAN:  1. Patient with long-standing, uncontrolled DM, with better control at this visit >> reviewed sugars in his log >> improved. He is still keeping a good diet but did not exercise that much as his wife was very sick. He is very stressed and subsequently tremors are worse. He also started to stutter (also 2/2 stress). He also has pbs with his children, whom he did not see in a long time as they cut ties with him.  - as sugars are still higher than target throughout the day >> will increase Metformin in am - I suggested to:  Patient Instructions  Please continue: - Lantus 30-31 units at bedtime - Invokana 150 mg daily in am   Please increase: - Metformin ER to 1000 mg in  am and 500 with dinner  Please return in 3 months with your sugar log.   - today, HbA1c is 6.9% (better) - continue checking sugars at different times of the day - check 1-2x a day, rotating checks - advised for yearly eye exams >> he is UTD - Return to clinic in 3 mo with sugar log   2. HL - LDL better at last check, but still high - He could not tolerate Crestor, Pravastatin - would likely benefit from PCSK9 inh >> will discuss at  next visit >> may need to see Lipid Clinic  Philemon Kingdom, MD PhD [facility] Endocrinology

## 2017-05-02 NOTE — Patient Instructions (Signed)
Please continue: - Lantus 30-31 units at bedtime - Invokana 150 mg daily in am   Please increase: - Metformin ER to 1000 mg in am and 500 with dinner  Please return in 3 months with your sugar log.

## 2017-05-07 ENCOUNTER — Encounter: Payer: Self-pay | Admitting: Family Medicine

## 2017-05-07 ENCOUNTER — Encounter: Payer: Self-pay | Admitting: Neurology

## 2017-05-13 ENCOUNTER — Encounter: Payer: Self-pay | Admitting: Family Medicine

## 2017-05-13 ENCOUNTER — Ambulatory Visit (INDEPENDENT_AMBULATORY_CARE_PROVIDER_SITE_OTHER)
Admission: RE | Admit: 2017-05-13 | Discharge: 2017-05-13 | Disposition: A | Discharge status: Home / Self Care | Payer: PPO | Source: Ambulatory Visit | Attending: Family Medicine | Admitting: Family Medicine

## 2017-05-13 ENCOUNTER — Ambulatory Visit (INDEPENDENT_AMBULATORY_CARE_PROVIDER_SITE_OTHER): Payer: PPO | Admitting: Family Medicine

## 2017-05-13 VITALS — BP 110/72 | HR 86 | Temp 97.6°F | Wt 179.5 lb

## 2017-05-13 DIAGNOSIS — R251 Tremor, unspecified: Secondary | ICD-10-CM | POA: Diagnosis not present

## 2017-05-13 DIAGNOSIS — M542 Cervicalgia: Secondary | ICD-10-CM | POA: Diagnosis not present

## 2017-05-13 DIAGNOSIS — I10 Essential (primary) hypertension: Secondary | ICD-10-CM | POA: Diagnosis not present

## 2017-05-13 MED ORDER — PROPRANOLOL HCL 10 MG PO TABS: ORAL_TABLET | ORAL | Status: DC

## 2017-05-13 NOTE — Patient Instructions (Addendum)
Cut the propranolol back to 1 tab twice a day.  If you still get lightheaded, then cut back to 1 pill a day.   Stop the medicine if needed.  See if the tremor is worse on the lower dose of propranolol.   Go to the lab on the way out.  We'll contact you with your xray report. Take care.  Glad to see you.

## 2017-05-13 NOTE — Progress Notes (Signed)
Prev CT per eye clinic was not done, d/w pt today at the [place].   I will defer to patient and eye clinic.    Dizziness started after restarting tx with primidone in 03/2017; med helped with tremor but he couldn't tolerate it due to vertigo.  Stopping the medicine helped a little with severe vertigo sx.  In the meantime he still has lightheadedness.  Worse with getting up after he has been leaning over.  He has been monitoring his BP at home, similar to today.   He had prev hit his head earlier this summer when he fell.  Now with occ L shoulder and upper arm and neck pain. No pain radiating all the way down to the L hand.  No R sided sx.  he wants to avoid surgery if at all possible.  PMH and SH reviewed  ROS: Per HPI unless specifically indicated in ROS section   Meds, vitals, and allergies reviewed.   nad ncat Neck with ROM limitation as expected, he has dec L rotational ROM at baseline.   No LA rrr ctab Abd soft, not ttp Chronic numbness in the L forearm at baseline but o/w S/S wnl BUE B hand tremor noted.  Ext w/o edema

## 2017-05-14 ENCOUNTER — Encounter: Payer: Self-pay | Admitting: Family Medicine

## 2017-05-14 DIAGNOSIS — M542 Cervicalgia: Secondary | ICD-10-CM | POA: Insufficient documentation

## 2017-05-14 NOTE — Assessment & Plan Note (Signed)
Unable to tolerate primidone. We will see if/how much the lower dose of propranolol makes the tremor worse.

## 2017-05-14 NOTE — Assessment & Plan Note (Addendum)
He has been getting lightheaded and it may be that he is overtreated with propranolol and now has a relatively low blood pressure. Okay to cut back to 10 mg twice a day. If not tolerated he can cut back to 10 mg once a day. He will see how much this affects his tremor. Update me as needed. Discussed with patient. He agrees. >25 minutes spent in face to face time with patient, >50% spent in counselling or coordination of care.

## 2017-05-14 NOTE — Assessment & Plan Note (Signed)
He had fallen earlier this summer. Reasonable to recheck plain films just to make sure he did not have overt pathology noted on that study. He wants to avoid any other intervention if at all possible. No emergent symptoms at this point. See notes on imaging. He agrees.

## 2017-05-20 ENCOUNTER — Encounter: Payer: Self-pay | Admitting: Family Medicine

## 2017-05-23 DIAGNOSIS — Z85828 Personal history of other malignant neoplasm of skin: Secondary | ICD-10-CM | POA: Diagnosis not present

## 2017-05-23 DIAGNOSIS — A311 Cutaneous mycobacterial infection: Secondary | ICD-10-CM | POA: Diagnosis not present

## 2017-05-23 DIAGNOSIS — Z08 Encounter for follow-up examination after completed treatment for malignant neoplasm: Secondary | ICD-10-CM | POA: Diagnosis not present

## 2017-05-23 DIAGNOSIS — X32XXXA Exposure to sunlight, initial encounter: Secondary | ICD-10-CM | POA: Diagnosis not present

## 2017-05-23 DIAGNOSIS — L570 Actinic keratosis: Secondary | ICD-10-CM | POA: Diagnosis not present

## 2017-05-28 ENCOUNTER — Encounter: Payer: Self-pay | Admitting: Family Medicine

## 2017-06-04 DIAGNOSIS — A319 Mycobacterial infection, unspecified: Secondary | ICD-10-CM | POA: Diagnosis not present

## 2017-06-15 ENCOUNTER — Encounter: Payer: Self-pay | Admitting: Family Medicine

## 2017-06-16 ENCOUNTER — Encounter: Payer: Self-pay | Admitting: Family Medicine

## 2017-06-17 ENCOUNTER — Other Ambulatory Visit: Payer: Self-pay

## 2017-06-17 DIAGNOSIS — G8929 Other chronic pain: Secondary | ICD-10-CM

## 2017-06-17 NOTE — Telephone Encounter (Signed)
Pt left v/m requesting rx for oxycodone apap. Call when ready for pick up. Pt is out of med. last  Oxycodone apap printed # 60 on 03/17/17. Last seen 05/13/17.

## 2017-06-18 DIAGNOSIS — G8929 Other chronic pain: Secondary | ICD-10-CM | POA: Insufficient documentation

## 2017-06-18 MED ORDER — OXYCODONE-ACETAMINOPHEN 5-325 MG PO TABS: 1.0000 | ORAL_TABLET | Freq: Four times a day (QID) | ORAL | 0 refills | Status: DC | PRN

## 2017-06-18 NOTE — Telephone Encounter (Signed)
Indication for chronic opioid:  Chronic neck and back pain Medication and dose: oxycodon 5/325mg  # pills per month: #60 per rx but that usually lasts >1 month.   Last UDS date: 03/14/16 Pain contract signed (Y/N):  yes Date narcotic database last reviewed (include red flags):  06/18/17  Printed.  Thanks.

## 2017-06-18 NOTE — Telephone Encounter (Signed)
Patient notified by telephone that script is up front ready for pickup. 

## 2017-06-24 DIAGNOSIS — Z85828 Personal history of other malignant neoplasm of skin: Secondary | ICD-10-CM | POA: Diagnosis not present

## 2017-06-24 DIAGNOSIS — A318 Other mycobacterial infections: Secondary | ICD-10-CM | POA: Diagnosis not present

## 2017-06-24 DIAGNOSIS — A319 Mycobacterial infection, unspecified: Secondary | ICD-10-CM | POA: Diagnosis not present

## 2017-06-27 ENCOUNTER — Other Ambulatory Visit: Payer: Self-pay | Admitting: Family Medicine

## 2017-06-27 DIAGNOSIS — H2513 Age-related nuclear cataract, bilateral: Secondary | ICD-10-CM | POA: Diagnosis not present

## 2017-06-27 NOTE — Telephone Encounter (Signed)
Received refill electronically Last office visit 05/13/17 Medication is no longer on med list

## 2017-06-30 ENCOUNTER — Encounter: Payer: Self-pay | Admitting: Neurology

## 2017-06-30 ENCOUNTER — Encounter: Payer: Self-pay | Admitting: Family Medicine

## 2017-06-30 NOTE — Telephone Encounter (Signed)
Spoke to pt who states he got a Rx in June as was advised to take them prn. pls advise

## 2017-06-30 NOTE — Telephone Encounter (Signed)
Please verify with patient.  He had tapered off.  See what details you can get.  Thanks.

## 2017-07-01 NOTE — Telephone Encounter (Signed)
Sent.  Thanks.  Use the least amount possible.

## 2017-07-01 NOTE — Telephone Encounter (Signed)
Patient notified as instructed by telephone and verbalized understanding. Rx called to pharmacy as instructed. 

## 2017-07-02 DIAGNOSIS — A318 Other mycobacterial infections: Secondary | ICD-10-CM | POA: Diagnosis not present

## 2017-07-02 DIAGNOSIS — L570 Actinic keratosis: Secondary | ICD-10-CM | POA: Diagnosis not present

## 2017-07-03 ENCOUNTER — Ambulatory Visit: Payer: Self-pay | Admitting: Family Medicine

## 2017-07-10 ENCOUNTER — Ambulatory Visit: Payer: PPO | Admitting: Neurology

## 2017-07-18 ENCOUNTER — Encounter: Payer: Self-pay | Admitting: Internal Medicine

## 2017-07-23 ENCOUNTER — Ambulatory Visit: Payer: PPO | Admitting: Primary Care

## 2017-07-23 ENCOUNTER — Encounter: Payer: Self-pay | Admitting: Primary Care

## 2017-07-23 ENCOUNTER — Ambulatory Visit: Payer: Self-pay | Admitting: *Deleted

## 2017-07-23 VITALS — BP 140/76 | HR 78 | Temp 97.8°F | Ht 69.0 in | Wt 178.8 lb

## 2017-07-23 DIAGNOSIS — R21 Rash and other nonspecific skin eruption: Secondary | ICD-10-CM

## 2017-07-23 MED ORDER — PERMETHRIN 5 % EX CREA: TOPICAL_CREAM | CUTANEOUS | 0 refills | Status: DC

## 2017-07-23 NOTE — Patient Instructions (Signed)
Apply 1/2 tube of the permethrin cream once from the neck to the toes. Leave this on for 8 hours then shower off.   You can take the benadryl as needed for itching. Caution as this may cause drowsiness.   Please call us if no improvement in 2-3 days.  It was a pleasure meeting you!

## 2017-07-23 NOTE — Progress Notes (Signed)
Subjective:    Patient ID: Walter Harris, male    DOB: [birth year], 71 y.o.   MRN: [id number]  HPI  Walter Harris is a 71 year old male with a history of type 2 diabetes, essential hypertension, basal cell carcinoma who presents today with a chief complaint of rash and lump to the axilla.  He first noticed a rash to his right waistline yesterday. He describes his rash as itchy and irritative. He denies pain. He's been taking benadryl and applied anti-itch cream with some improvement. He has a dog in the house. Denies new medications (has had some eliminated), soaps/detergents, food. No one else in his house is itching. He did pull out an old coat from his closet yesterday that he hadn't worn since last winter.  He's also noticed a lump to his left and right axilla which has decreased in size and is not painful. He's also reporting a three day history of sinus pressure and rhinorrhea. He denies fevers, chills, cough.  Review of Systems  Constitutional: Negative for fever.  HENT: Positive for rhinorrhea and sinus pressure.   Respiratory: Negative for cough.   Skin: Positive for rash.  Allergic/Immunologic: Positive for environmental allergies.       Past Medical History:  Diagnosis Date  . Basal cell carcinoma    multiple  . Cataracts, both eyes   . Complication of anesthesia    post-operative cognitive dysfunction after ACDF on 05/01/11 [facility])  . 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 Diabetes mellitus   . DJD (degenerative joint disease)    lumbar spine  . Hearing impaired    hearing aids-transmitter rt   . History of blood transfusion    as a baby in 33  . History of colonic polyps   . Hyperlipidemia   . Hypertension    pt. denies high blood pressure  . Kidney stone 1967  . Nose fracture    3x  . OCD (obsessive compulsive disorder)   . Stroke [facility])    "mini stroke, that's what gave me the tremors"  . Stuttering   . Tremor, essential   . Wears glasses      Social  History   Socioeconomic History  . Marital status: Married    Spouse name: Not on file  . Number of children: 1  . Years of education: Not on file  . Highest education level: Not on file  Social Needs  . Financial resource strain: Not on file  . Food insecurity - worry: Not on file  . Food insecurity - inability: Not on file  . Transportation needs - medical: Not on file  . Transportation needs - non-medical: Not on file  Occupational History  . Occupation: disabled    Employer: RETIRED    CommentDatabase administrator  Tobacco Use  . Smoking status: Former Smoker    Packs/day: 0.50    Years: 40.00    Pack years: 20.00    Types: Pipe    Last attempt to quit: 06/09/2016    Years since quitting: 1.1  . Smokeless tobacco: Never Used  Substance and Sexual Activity  . Alcohol use: No    Alcohol/week: 0.0 oz  . Drug use: No  . Sexual activity: No  Other Topics Concern  . Not on file  Social History Narrative   Married, 1992   1 biological child, some contact as of 2017   Wife has 2 kids, no contact as of 2017  Prev worked as Designer, television/film set, Lobbyist, etc    Past Surgical History:  Procedure Laterality Date  . APPENDECTOMY  1960  . BACK SURGERY  2012   lumbar surgery  . CERVICAL FUSION  1990  . CERVICAL FUSION  8/12   multiple levels with plates  . CHOLESTEATOMA EXCISION  1993  . COLLATERAL LIGAMENT REPAIR, ELBOW     bilateral, nerve improvement left 08/04, right 09/04  . COLONOSCOPY    . CYST EXCISION     back of cervical spine total of 5 surgeries  . EXTERNAL EAR SURGERY     multiple ear surguries as a child  . OTHER SURGICAL HISTORY     benign head tumor#5  . OTHER SURGICAL HISTORY     sebaceous cysts-post neck x5  . SHOULDER ARTHROSCOPY W/ ACROMIAL REPAIR  08/2004   left shoulder  . TONSILLECTOMY      Family History  Problem Relation Age of Onset  . OCD Mother   . Bipolar disorder Mother   . Cancer Mother   . 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 Arthritis Sister   . OCD Sister   . Colon cancer Neg Hx   . Prostate cancer Neg Hx     Allergies  Allergen Reactions  . Bee Venom Anaphylaxis  . Pravastatin Other (See Comments)    Severe myalgias  . Ace Inhibitors     Cough  . Actos [Pioglitazone] Other (See Comments)    Intolerant, not allergic.   . Angiotensin Receptor Blockers Other (See Comments)    cramps  . Aripiprazole Other (See Comments)    Unknown reaction many years ago  . Crestor [Rosuvastatin Calcium] Other (See Comments)    Aches, even with twice weekly dosing  . Losartan Other (See Comments)    Leg cramps  . Metformin And Related Other (See Comments)    Able to tolerate XR formulation, not allergic.   . Olanzapine Other (See Comments)    Unknown reaction many years ago   . Other Cough    Blood pressure medication caused a cough after one dose  . Pneumovax 23 [Pneumococcal Vac Polyvalent] Other (See Comments)    aches  . Primidone Other (See Comments)    Vertigo at 50mg , able to tolerate 25mg    . Rosuvastatin Calcium   . Bactrim [Sulfamethoxazole-Trimethoprim] Other (See Comments)    Mild reaction per Dr. Silvio Pate, pt does not remember the reaction  . Codeine Phosphate Rash  . Divalproex Sodium Other (See Comments)     tremor  . Lithium Carbonate Other (See Comments)    Tremors     Current Outpatient Medications on File Prior to Visit  Medication Sig Dispense Refill  . ALPRAZolam (XANAX) 0.5 MG tablet 1/2 TO 1 TABLET BY MOUTH TWICE DAILY AS NEEDED FOR ANXIETY 60 tablet 0  . canagliflozin (INVOKANA) 300 MG TABS tablet Take by mouth 0.5-1 tablet a day 90 tablet 3  . Insulin Glargine (LANTUS SOLOSTAR) 100 UNIT/ML Solostar Pen TAKE UP TO 50 UNITS DAILY AS DIRECTED 15 mL 5  . metFORMIN (GLUCOPHAGE-XR) 500 MG 24 hr tablet Take 1 tablet 3x a day 270 tablet 3  . Multiple Vitamin (MULTIVITAMIN) tablet Take 1 tablet by mouth daily.    Marland Kitchen oxyCODONE-acetaminophen (PERCOCET/ROXICET)  5-325 MG tablet Take 1 tablet by mouth every 6 (six) hours as needed (pain). 60 tablet 0  . propranolol (INDERAL) 10 MG tablet 1 tab in  the AM and 1 in the PM    . PSYLLIUM FIBER PO Take 1 Dose by mouth daily.    . traZODone (DESYREL) 50 MG tablet TAKE 1 AND 1/2 TABLETS AT BEDTIME AS NEEDED FOR SLEEP 135 tablet 3   No current facility-administered medications on file prior to visit.     BP 140/76   Pulse 78   Temp 97.8 F (36.6 C) (Oral)   Ht 5\' 9"  (1.753 m)   Wt 178 lb 12.8 oz (81.1 kg)   SpO2 96%   BMI 26.40 kg/m    Objective:   Physical Exam  Constitutional: He appears well-nourished.  Neck: Neck supple.  Cardiovascular: Normal rate.  Pulmonary/Chest: Effort normal.  Skin: Skin is warm and dry.  Numerous raised insect bite appearing bumps to bilateral buttocks and to waist line. No vesicles. No cellulitis. 1/2 cm rounded, raised, mild erythema to right axilla. This is non tender without surrounding erythema.           Assessment & Plan:  Insect Bites:  Located to bilateral buttocks and waistline. Exam today consistent for bedbugs or scabies.  Bump under axillary appears benign, doesn't appear to be infectious. Rx for Permethrin cream sent to pharmacy for one time use. Continue benadryl PRN as this has been helpful.  Warm compresses to right axillary lump. He will update if no improvement in 2-3 hours.

## 2017-07-23 NOTE — Telephone Encounter (Signed)
Pt called complaining of ? Insect bite to right hip area last night; Pt states that she took 2 benadryl last night and also applied neosporin to the site; Pt also states that she has not been out of the house in 2 days so she can not imagine what would have bitten him Reason for Disposition . [1] Red or very tender (to touch) area AND [2] started over 24 hours after the bite  Answer Assessment - Initial Assessment Questions 1. TYPE of INSECT: "What type of insect was it?"      unsure 2. ONSET: "When did you get bitten?"      yesterday 3. LOCATION: "Where is the insect bite located?"      Right hip at belt line 4. REDNESS: "Is the area red or pink?" If so, ask "What size is area of redness?" (inches or cm). "When did the redness start?"     Tiny like a pimple size, numerous 8-9; described as little red dots 5. PAIN: "Is there any pain?" If so, ask: "How bad is it?"  (Scale 1-10; or mild, moderate, severe)    no 6. ITCHING: "Does it itch?" If so, ask: "How bad is the itch?"    - MILD: doesn't interfere with normal activities   - MODERATE-SEVERE: interferes with work, school, sleep, or other activities      mild 7. SWELLING: "How big is the swelling?" (inches, cm, or compare to coins)     Yes lymph node under armpits 8. OTHER SYMPTOMS: "Do you have any other symptoms?"  (e.g., difficulty breathing, hives)     Throat feels swollen; pt says she can swallow 9. PREGNANCY: "Is there any chance you are pregnant?" "When was your last menstrual period?"     n/a  Protocols used: INSECT BITE-A-AH

## 2017-07-30 ENCOUNTER — Ambulatory Visit: Payer: Self-pay | Admitting: Family Medicine

## 2017-08-02 DIAGNOSIS — S61217A Laceration without foreign body of left little finger without damage to nail, initial encounter: Secondary | ICD-10-CM | POA: Diagnosis not present

## 2017-08-05 ENCOUNTER — Other Ambulatory Visit (INDEPENDENT_AMBULATORY_CARE_PROVIDER_SITE_OTHER): Payer: PPO

## 2017-08-05 ENCOUNTER — Ambulatory Visit: Payer: PPO | Admitting: Internal Medicine

## 2017-08-05 ENCOUNTER — Encounter: Payer: Self-pay | Admitting: Internal Medicine

## 2017-08-05 VITALS — BP 170/96 | HR 94 | Wt 175.6 lb

## 2017-08-05 DIAGNOSIS — E1165 Type 2 diabetes mellitus with hyperglycemia: Secondary | ICD-10-CM

## 2017-08-05 DIAGNOSIS — E1142 Type 2 diabetes mellitus with diabetic polyneuropathy: Secondary | ICD-10-CM | POA: Diagnosis not present

## 2017-08-05 DIAGNOSIS — IMO0002 Reserved for concepts with insufficient information to code with codable children: Secondary | ICD-10-CM

## 2017-08-05 DIAGNOSIS — E785 Hyperlipidemia, unspecified: Secondary | ICD-10-CM

## 2017-08-05 LAB — LIPID PANEL
CHOLESTEROL: 206 mg/dL — AB (ref 0–200)
HDL: 52.5 mg/dL (ref >39.00)
LDL CALC: 129 mg/dL — AB (ref 0–99)
NONHDL: 153.27
Total CHOL/HDL Ratio: 4
Triglycerides: 123 mg/dL (ref 0.0–149.0)
VLDL: 24.6 mg/dL (ref 0.0–40.0)

## 2017-08-05 LAB — POCT GLYCOSYLATED HEMOGLOBIN (HGB A1C): HEMOGLOBIN A1C: 6.9

## 2017-08-05 MED ORDER — INSULIN GLARGINE 100 UNIT/ML SOLOSTAR PEN: PEN_INJECTOR | SUBCUTANEOUS | 11 refills | Status: DC

## 2017-08-05 NOTE — Addendum Note (Signed)
Addended by: Drucilla Schmidt on: 08/05/2017 11:16 AM   Modules accepted: Orders

## 2017-08-05 NOTE — Progress Notes (Addendum)
Patient ID: Walter Harris, male   DOB: [birth year], 71 y.o.   MRN: [id number]   HPI: Walter Harris is a 71 y.o.-year-old male,  DM2, dx in ~2007, insulin-dependent soon after dx, uncontrolled, with complications (PN). He is here with his wife offers part of the history. Last visit 3 months ago.  He continues to have tremors which have worsened.  Primidone not helping >> stopped.  By his scale at home, he lost 60 lbs in ~1.5 years. He is not eating fried foods, red meet, mostly a vegetarian.  Last hemoglobin A1c was: Lab Results  Component Value Date   HGBA1C 6.9 05/02/2017   HGBA1C 7.3 01/21/2017   HGBA1C 6.8 10/23/2016   Pt is on a regimen of: - Lantus 32 >> 35 >> 30 >> 30-31 >> 25 units at bedtime - Metformin ER 1000 mg a day with b'fast and 500 with dinner >> he tolerates this well He was on Invokana 150 (1/2 of 300 mg) daily in am >> stopped since last visit - 1 mo ago. He stopped Metformin >> GI sx  He stopped Actos >> mm cramps, generalized pain.  Pt checks his sugars 1-2x a day: - am:68 x1, 86-130, 169 >> 71, 79, 82-117 >> 63, 74-108 - 2h after b'fast:122, 128 >> 133-155 >> 101-120 - before lunch: 112-153 >> 131-164, 176 >> 81, 99, 100-147 - 2h after lunch:133-172 >> 157-179, 201 >> 118-148 - before dinner:91-140, 152 >> 144-147 >> 112-121, 143 - 2h after dinner: 143-181 >> 161-186 >> 122-161 - bedtime: 142-177 >> 140-163 >> 130, 162 - nighttime: n/c >> 99-116 >> n/c >> 54 Lowest sugar was 71 >> 54 at night 2 mo ago; he has hypoglycemia awareness at 71.  Highest sugar was 201 >> 148.  Glucometer: [employer]. He obtains strips on [employer] ($11 versus $60 in the pharmacy).   Pt's meals are: - Breakfast: oatmeal, bacon + 1 egg, toast - Lunch: sandwich or soup - Dinner: meat + 2 veggies - Snacks: 2 or 3    - No CKD, last BUN/creatinine:  Lab Results  Component Value Date   BUN 15 04/18/2017   BUN 12 08/08/2016   CREATININE 0.92 04/18/2017   CREATININE 0.90 10/09/2016    - + HL. last set of lipids: Lab Results  Component Value Date   CHOL 210 (H) 03/14/2016   HDL 42.80 03/14/2016   LDLCALC 143 (H) 03/14/2016   LDLDIRECT 160.2 06/01/2012   TRIG 121.0 03/14/2016   CHOLHDL 5 03/14/2016   - last eye exam was 02/2017 >> No DR - No numbness and tingling in his feet.  He has pbs with his children, whom he did not see in a long time as they cut ties with him.   ROS: Constitutional: no weight gain/+ weight loss, no fatigue, no subjective hyperthermia, no subjective hypothermia, + nocturia Eyes: no blurry vision, no xerophthalmia ENT: no sore throat, no nodules palpated in throat, no dysphagia, no odynophagia, no hoarseness Cardiovascular: no CP/no SOB/no palpitations/no leg swelling Respiratory: no cough/no SOB/no wheezing Gastrointestinal: no N/no V/no D/no C/no acid reflux Musculoskeletal: no muscle aches/+ joint aches Skin: no rashes, no hair loss Neurological: + tremors/no numbness/no tingling/+ dizziness  I reviewed pt's medications, allergies, PMH, social hx, family hx, and changes were documented in the history of present illness. Otherwise, unchanged from my initial visit note.   Past Medical History:  Diagnosis Date  . Basal cell carcinoma    multiple  . Cataracts, both  eyes   . Complication of anesthesia    post-operative cognitive dysfunction after ACDF on 05/01/11 [facility])  . 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 Diabetes mellitus   . DJD (degenerative joint disease)    lumbar spine  . Hearing impaired    hearing aids-transmitter rt   . History of blood transfusion    as a baby in 32  . History of colonic polyps   . Hyperlipidemia   . Hypertension    pt. denies high blood pressure  . Kidney stone 1967  . Nose fracture    3x  . OCD (obsessive compulsive disorder)   . Stroke [facility])    "mini stroke, that's what gave me the tremors"  . Stuttering   . Tremor, essential   . Wears glasses    Past Surgical History:  Procedure  Laterality Date  . APPENDECTOMY  1960  . BACK SURGERY  2012   lumbar surgery  . CERVICAL FUSION  1990  . CERVICAL FUSION  8/12   multiple levels with plates  . CHOLESTEATOMA EXCISION  1993  . COLLATERAL LIGAMENT REPAIR, ELBOW     bilateral, nerve improvement left 08/04, right 09/04  . COLONOSCOPY    . CYST EXCISION     back of cervical spine total of 5 surgeries  . EXTERNAL EAR SURGERY     multiple ear surguries as a child  . OTHER SURGICAL HISTORY     benign head tumor#5  . OTHER SURGICAL HISTORY     sebaceous cysts-post neck x5  . SHOULDER ARTHROSCOPY W/ ACROMIAL REPAIR  08/2004   left shoulder  . TONSILLECTOMY    . ULNAR NERVE TRANSPOSITION Right 05/13/2013   Procedure: RIGHT ULNAR NERVE DECOMPRESSION/TRANSPOSITION;  Surgeon: [name]., MD;  Location: [facility];  Service: Orthopedics;  Laterality: Right;   Social History   Socioeconomic History  . Marital status: Married    Spouse name: Not on file  . Number of children: 1  . Years of education: Not on file  . Highest education level: Not on file  Social Needs  . Financial resource strain: Not on file  . Food insecurity - worry: Not on file  . Food insecurity - inability: Not on file  . Transportation needs - medical: Not on file  . Transportation needs - non-medical: Not on file  Occupational History  . Occupation: disabled    Employer: RETIRED    CommentDatabase administrator  Tobacco Use  . Smoking status: Former Smoker    Packs/day: 0.50    Years: 40.00    Pack years: 20.00    Types: Pipe    Last attempt to quit: 06/09/2016    Years since quitting: 1.1  . Smokeless tobacco: Never Used  Substance and Sexual Activity  . Alcohol use: No    Alcohol/week: 0.0 oz  . Drug use: No  . Sexual activity: No  Other Topics Concern  . Not on file  Social History Narrative   Married, 1992   1 biological child, some contact as of 2017   Wife has 2 kids, no contact as of 2017   Prev worked as Engineer, production, Lobbyist, etc   Current Outpatient Medications on File Prior to Visit  Medication Sig Dispense Refill  . ALPRAZolam (XANAX) 0.5 MG tablet 1/2 TO 1 TABLET BY MOUTH TWICE DAILY AS NEEDED FOR ANXIETY 60 tablet 0  . canagliflozin (INVOKANA) 300 MG TABS tablet Take by  mouth 0.5-1 tablet a day 90 tablet 3  . Insulin Glargine (LANTUS SOLOSTAR) 100 UNIT/ML Solostar Pen TAKE UP TO 50 UNITS DAILY AS DIRECTED 15 mL 5  . metFORMIN (GLUCOPHAGE-XR) 500 MG 24 hr tablet Take 1 tablet 3x a day 270 tablet 3  . Multiple Vitamin (MULTIVITAMIN) tablet Take 1 tablet by mouth daily.    Marland Kitchen oxyCODONE-acetaminophen (PERCOCET/ROXICET) 5-325 MG tablet Take 1 tablet by mouth every 6 (six) hours as needed (pain). 60 tablet 0  . permethrin (ELIMITE) 5 % cream Apply 1/2 tube to entire body from neck to toes. Leave on for 8 hours then shower off. 60 g 0  . propranolol (INDERAL) 10 MG tablet 1 tab in the AM and 1 in the PM    . PSYLLIUM FIBER PO Take 1 Dose by mouth daily.    . traZODone (DESYREL) 50 MG tablet TAKE 1 AND 1/2 TABLETS AT BEDTIME AS NEEDED FOR SLEEP 135 tablet 3   No current facility-administered medications on file prior to visit.    Allergies  Allergen Reactions  . Bee Venom Anaphylaxis  . Pravastatin Other (See Comments)    Severe myalgias  . Ace Inhibitors     Cough  . Actos [Pioglitazone] Other (See Comments)    Intolerant, not allergic.   . Angiotensin Receptor Blockers Other (See Comments)    cramps  . Aripiprazole Other (See Comments)    Unknown reaction many years ago  . Crestor [Rosuvastatin Calcium] Other (See Comments)    Aches, even with twice weekly dosing  . Losartan Other (See Comments)    Leg cramps  . Metformin And Related Other (See Comments)    Able to tolerate XR formulation, not allergic.   . Olanzapine Other (See Comments)    Unknown reaction many years ago   . Other Cough    Blood pressure medication caused a cough after one dose  . Pneumovax 23  [Pneumococcal Vac Polyvalent] Other (See Comments)    aches  . Primidone Other (See Comments)    Vertigo at 50mg , able to tolerate 25mg    . Rosuvastatin Calcium   . Bactrim [Sulfamethoxazole-Trimethoprim] Other (See Comments)    Mild reaction per Dr. Silvio Pate, pt does not remember the reaction  . Codeine Phosphate Rash  . Divalproex Sodium Other (See Comments)     tremor  . Lithium Carbonate Other (See Comments)    Tremors    Family History  Problem Relation Age of Onset  . OCD Mother   . Bipolar disorder Mother   . Cancer Mother   . 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 Arthritis Sister   . OCD Sister   . Colon cancer Neg Hx   . Prostate cancer Neg Hx    PE: BP (!) 170/96   Pulse 94   Wt 175 lb 9.6 oz (79.7 kg)   SpO2 99%   BMI 25.93 kg/m  Wt Readings from Last 3 Encounters:  08/05/17 175 lb 9.6 oz (79.7 kg)  07/23/17 178 lb 12.8 oz (81.1 kg)  05/13/17 179 lb 8 oz (81.4 kg)   Constitutional: overweight, in NAD Eyes: PERRLA, EOMI, no exophthalmos ENT: moist mucous membranes, no thyromegaly, no cervical lymphadenopathy Cardiovascular: tachycardiaRR, No MRG Respiratory: CTA B Gastrointestinal: abdomen soft, NT, ND, BS+ Musculoskeletal: no deformities, strength intact in all 4 Skin: moist, warm, no rashes Neurological: + tremor with outstretched hands, DTR normal in all 4  ASSESSMENT: 1. DM2, insulin-dependent,  uncontrolled, with complications - PN  2. HL  PLAN:  1. Patient with long-standing, uncontrolled diabetes, with improved control at last visit, however, with worsening stress due to his wife being sick, which exacerbated his tremor and stuttering.  - At last visit, his sugars were still higher than target throughout the day so we increased his metformin in the morning - at this visit, sugars are much better to the point of occas. Lows at night and in am efore he decreased his Lantus dose to 25 units >> will continue this - he stopped  the Invokana since last visit, but we do not need it for now as sugars are at or very close to goal - I suggested to:  Patient Instructions  Please continue: - Lantus 25 units at bedtime - Metformin ER 1000 mg in a.m. and 500 mg with dinner  Please return in 4 months with your sugar log.   - today, HbA1c is 6.9% (stable) - continue checking sugars at different times of the day - check 3-4x a day, rotating checks - advised for yearly eye exams >> he is UTD - Return to clinic in 4 mo with sugar log    2. HL - LDL has improved at last check, last year, but still high - He is due for another lipid panel - He could not tolerate Crestor or pravastatin - would likely benefit from PCSK9 inhibitor >> we discussed about going to lipid Clinic, but would like to check a lipid panel first >> ordered  Lab on 08/05/2017  Component Date Value Ref Range Status  . Cholesterol 08/05/2017 206* 0 - 200 mg/dL Final   ATP III Classification       Desirable:  < 200 mg/dL               Borderline High:  200 - 239 mg/dL          High:  > = 240 mg/dL  . Triglycerides 08/05/2017 123.0  0.0 - 149.0 mg/dL Final   Normal:  <150 mg/dLBorderline High:  150 - 199 mg/dL  . HDL 08/05/2017 52.50  >39.00 mg/dL Final  . VLDL 08/05/2017 24.6  0.0 - 40.0 mg/dL Final  . LDL Cholesterol 08/05/2017 129* 0 - 99 mg/dL Final  . Total CHOL/HDL Ratio 08/05/2017 4   Final                  Men          Women1/2 Average Risk     3.4          3.3Average Risk          5.0          4.42X Average Risk          9.6          7.13X Average Risk          15.0          11.0                      . NonHDL 08/05/2017 153.27   Final   NOTE:  Non-HDL goal should be 30 mg/dL higher than patient's LDL goal (i.e. LDL goal of < 70 mg/dL, would have non-HDL goal of < 100 mg/dL)  Office Visit on 08/05/2017  Component Date Value Ref Range Status  . Hemoglobin A1C 08/05/2017 6.9   Final   LDL improved further!  Philemon Kingdom, MD PhD [facility]  Endocrinology

## 2017-08-05 NOTE — Addendum Note (Signed)
Addended by: Philemon Kingdom on: 08/05/2017 11:16 AM   Modules accepted: Orders

## 2017-08-05 NOTE — Patient Instructions (Addendum)
Please continue: - Lantus 25 units at bedtime - Metformin ER 1000 mg in a.m. and 500 mg with dinner  Please return in 4 months with your sugar log.

## 2017-08-11 ENCOUNTER — Ambulatory Visit: Payer: PPO | Admitting: Family Medicine

## 2017-08-11 ENCOUNTER — Encounter: Payer: Self-pay | Admitting: Family Medicine

## 2017-08-11 DIAGNOSIS — F411 Generalized anxiety disorder: Secondary | ICD-10-CM

## 2017-08-11 MED ORDER — FLUOXETINE HCL 10 MG PO CAPS: 10.0000 mg | ORAL_CAPSULE | Freq: Every day | ORAL | 3 refills | Status: DC

## 2017-08-11 NOTE — Progress Notes (Signed)
Has seen endo and A1c improved.  Weight is down from prev, intentionally.  He is still getting panicked.  He prev took prozac but has been off for years.  He thought it did some good prev and his sister also benefited from the med.  Prev he was up to 20mg  a day.  He is impulsive as is, irritable.  No SI/HI.  "I don't like being this way."  His wife agrees.  Xanax helps some, used in the AM and that helps his tremors concurrently.    Meds, vitals, and allergies reviewed.   ROS: Per HPI unless specifically indicated in ROS section   nad ncat Speech wnl rrr ctab abd soft Ext w/o edema  Faint B hand tremor noted.  L 5th finger bandaged (prev seen at [facility]).

## 2017-08-11 NOTE — Assessment & Plan Note (Signed)
Restart prozac, no SI/HI, okay for outpatient f/u.  He agrees.  Routine cautions and timeline d/w pt.  Continue prn BZD.  He'll update me as needed.  Start with 10mg  prozac for 1 month, then inc to 20mg  if needed.  He agrees.

## 2017-08-11 NOTE — Patient Instructions (Signed)
Start prozac 10mg  a day for 1 month and then increase to 20mg  a day if needed.  Update me as needed.  Take care.  Glad to see you.

## 2017-08-21 ENCOUNTER — Other Ambulatory Visit: Payer: Self-pay | Admitting: Family Medicine

## 2017-08-21 NOTE — Telephone Encounter (Signed)
Copied from [place]. Topic: Quick Communication - Rx Refill/Question >> Aug 21, 2017  1:58 PM Carolyn Stare wrote:  Rx oxyCODONE-acetaminophen (PERCOCET/ROXICET) 5-325 MG tablet  Pick rx up at office    [phone]

## 2017-08-21 NOTE — Telephone Encounter (Signed)
Last Rx 06/18/2017. Last OV 07/2017-acute

## 2017-08-22 MED ORDER — OXYCODONE-ACETAMINOPHEN 5-325 MG PO TABS: 1.0000 | ORAL_TABLET | Freq: Four times a day (QID) | ORAL | 0 refills | Status: DC | PRN

## 2017-08-22 NOTE — Telephone Encounter (Signed)
Patient advised.  Rx left at front desk for pick up. 

## 2017-08-22 NOTE — Telephone Encounter (Signed)
Printed. Will need f/u OV re: pain meds after this Rx.  Thanks.

## 2017-08-25 ENCOUNTER — Telehealth: Payer: Self-pay | Admitting: Family Medicine

## 2017-08-25 ENCOUNTER — Other Ambulatory Visit: Payer: Self-pay | Admitting: Family Medicine

## 2017-08-25 NOTE — Telephone Encounter (Addendum)
Electronic refill request. Alprazolam Last office visit:   08/11/2017 Last Filled:    60 tablet 0 07/01/2017  Please advise.

## 2017-08-25 NOTE — Telephone Encounter (Signed)
Copied from [place] [id number]. Topic: Quick Communication - Rx Refill/Question >> Aug 25, 2017  9:57 AM Lennox Solders wrote: Has the patient contacted their pharmacy?yes (Agent: If no, request that the patient contact the pharmacy for the refill.) pt is out of alprazolam   Preferred Pharmacy (with phone number or street name): [facility]  [phone] Agent: Please be advised that RX refills may take up to 3 business days. We ask that you follow-up with your pharmacy.

## 2017-08-26 NOTE — Telephone Encounter (Signed)
Medication phoned to pharmacy. Spoke to [place].

## 2017-08-26 NOTE — Telephone Encounter (Signed)
Please call in.  Thanks.   

## 2017-08-27 ENCOUNTER — Ambulatory Visit: Payer: PPO | Admitting: Family Medicine

## 2017-08-27 ENCOUNTER — Encounter: Payer: Self-pay | Admitting: Family Medicine

## 2017-08-27 ENCOUNTER — Other Ambulatory Visit: Payer: Self-pay | Admitting: Family Medicine

## 2017-08-27 DIAGNOSIS — R42 Dizziness and giddiness: Secondary | ICD-10-CM

## 2017-08-27 NOTE — Progress Notes (Signed)
He didn't know if his new glasses were contributing to current sx.  He still has his old pair and can try those as needed.  He has been lightheaded and "my head feels tight."  Not as much sitting, can be worse if he stands up quickly.  No vertigo sx, no room spinning.  He has some balance difficulty with sudden turning or with looking downward with head movement but not with eye tracking.  His tremor continues, worse than prev.  Some discomfort but not a severe HA.    He is up to 20mg  of prozac as of last week.  He and his wife both thought it was helping some with his mood.  No ADE on med.   His sx above predate the prozac use.    He was taking oxycodone prn, not daily, trying to stretch his rx for his leg pain.  He can follow up in the future re: chronic pain meds.  He agrees.  D/w pt about routine clinic protocol.   Meds, vitals, and allergies reviewed.   ROS: Per HPI unless specifically indicated in ROS section   nad ncat TM wnl Nasal and OP exam wnl Neck supple, no LA rrr ctab abd soft Ext w/o edema Head and hand tremor noted at baseline.  He did not have significant change in heart rate upon standing but he did feel slightly lightheaded for a moment or 2.

## 2017-08-27 NOTE — Patient Instructions (Signed)
Get back with silver sneakers about balance exercises.  Try adding a little salt to your food and add in a little more water.  See if that helps.  Take care.  Glad to see you.

## 2017-08-27 NOTE — Telephone Encounter (Signed)
Sent.  Thanks.  Okay to restart if needed.

## 2017-08-27 NOTE — Telephone Encounter (Signed)
Electronic refill Last office visit 08/11/17 Medication is no longer on medication list

## 2017-08-28 DIAGNOSIS — R42 Dizziness and giddiness: Secondary | ICD-10-CM | POA: Insufficient documentation

## 2017-08-28 NOTE — Assessment & Plan Note (Signed)
The patient and I both think he likely has multiple issues going on.  He may have some balance troubles at baseline that are exacerbated by his tremor and he may benefit from balance exercise with Silver sneakers.  He will call about restarting that.  We both think it is a good idea for him to get back on that.  He may have some relative orthostasis.  He is not on any blood pressure medications at this point.  It may be reasonable to add in a small amount of salt to his diet and increase his fluid intake just to see if that will make a difference.  If he has more swelling then he can cut back and let me know.  At this point still okay for outpatient follow-up.  He agrees with plan.  I do not think his recent symptoms are related to Prozac use.  He does not think it is related to Prozac either. It is reasonable for him to talk to Dr. Carles Collet again at some point in the future regarding his tremor.  He was not enthused about a deep brain stimulator.  I get his point, in that he is not looking forward to an invasive procedure.

## 2017-09-05 ENCOUNTER — Telehealth: Payer: Self-pay | Admitting: Family Medicine

## 2017-09-05 NOTE — Telephone Encounter (Signed)
Please enter AWV labs.  Pt is coming in a few days earlier to have labs before he sees [place].  Thanks

## 2017-09-07 NOTE — Telephone Encounter (Signed)
Given the labs he recently had done, he doesn't need the lab appointment.   This is an atypical situation given the recent OVs.  Keep the scheduled appointments, but not the lab visit.  Thanks.

## 2017-09-08 NOTE — Telephone Encounter (Signed)
Patient advised. Lab appt cancelled.

## 2017-09-09 DIAGNOSIS — H269 Unspecified cataract: Secondary | ICD-10-CM

## 2017-09-09 HISTORY — DX: Unspecified cataract: H26.9

## 2017-09-18 DIAGNOSIS — R251 Tremor, unspecified: Secondary | ICD-10-CM | POA: Diagnosis not present

## 2017-09-23 ENCOUNTER — Other Ambulatory Visit: Payer: PPO

## 2017-09-24 ENCOUNTER — Other Ambulatory Visit: Payer: Self-pay | Admitting: Ophthalmology

## 2017-09-24 DIAGNOSIS — R251 Tremor, unspecified: Secondary | ICD-10-CM

## 2017-09-24 DIAGNOSIS — H539 Unspecified visual disturbance: Secondary | ICD-10-CM

## 2017-09-25 ENCOUNTER — Ambulatory Visit (INDEPENDENT_AMBULATORY_CARE_PROVIDER_SITE_OTHER): Payer: Medicare HMO | Admitting: Family Medicine

## 2017-09-25 ENCOUNTER — Encounter: Payer: Self-pay | Admitting: Family Medicine

## 2017-09-25 ENCOUNTER — Ambulatory Visit (INDEPENDENT_AMBULATORY_CARE_PROVIDER_SITE_OTHER): Payer: Medicare HMO

## 2017-09-25 VITALS — BP 126/70 | HR 72 | Temp 97.5°F | Ht 68.0 in | Wt 179.0 lb

## 2017-09-25 DIAGNOSIS — E1165 Type 2 diabetes mellitus with hyperglycemia: Secondary | ICD-10-CM

## 2017-09-25 DIAGNOSIS — E1142 Type 2 diabetes mellitus with diabetic polyneuropathy: Secondary | ICD-10-CM

## 2017-09-25 DIAGNOSIS — R251 Tremor, unspecified: Secondary | ICD-10-CM | POA: Diagnosis not present

## 2017-09-25 DIAGNOSIS — G8929 Other chronic pain: Secondary | ICD-10-CM

## 2017-09-25 DIAGNOSIS — R42 Dizziness and giddiness: Secondary | ICD-10-CM

## 2017-09-25 DIAGNOSIS — Z0000 Encounter for general adult medical examination without abnormal findings: Secondary | ICD-10-CM

## 2017-09-25 DIAGNOSIS — F411 Generalized anxiety disorder: Secondary | ICD-10-CM

## 2017-09-25 DIAGNOSIS — Z7189 Other specified counseling: Secondary | ICD-10-CM

## 2017-09-25 DIAGNOSIS — IMO0002 Reserved for concepts with insufficient information to code with codable children: Secondary | ICD-10-CM

## 2017-09-25 NOTE — Progress Notes (Signed)
Pre visit review using our clinic review tool, if applicable. No additional management support is needed unless otherwise documented below in the visit note. 

## 2017-09-25 NOTE — Progress Notes (Signed)
Tremor.  Worse today.  Some days are better/worse than others. He has seen Dr. Saintclair Halsted in the meantime and he has f/u pending re: brain imaging.  He has seen Dr. Catalina Pizza.  D/w pt.    Mood d/w pt.  Still on baseline meds.  Prozac helped his anxiety.  He didn't think the prozac clearly made his tremor worse.  He doesn't have much contact with his kids and that is chronic stressor for the patient.  D/w pt.  Still using xanax prn, w/o ADE and that helps the tremor a little.    Indication for chronic opioid:  Chronic neck and back pain Medication and dose: oxycodon 5/325mg  # pills per month: #60 per rx but that usually lasts >1 month.   Last UDS date: 03/14/16 Pain contract signed (Y/N):  yes Date narcotic database last reviewed (include red flags): 09/25/17 He gets some relief with pain med, not using med daily usually.  He is trying to stretch his rx.  No ADE on med.    He has endo f/u pending for 11/2017.  He has had more diarrhea recently.  D/w pt about metformin dose.  His sugar was 98 this AM. See AVS.   His lightheaded feeling got better with more water and salt intake.  Advance directive- wife designated if patient were incapacitated. Prostate cancer screening and PSA options (with potential risks and benefits of testing vs not testing) were discussed along with recent recs/guidelines.  He declined testing PSA at this point.  PMH and SH reviewed  ROS: Per HPI unless specifically indicated in ROS section   Meds, vitals, and allergies reviewed.   GEN: nad, alert and oriented His tremor is worse today compared to previous and his speech is affected with some stuttering.   HEENT: mucous membranes moist NECK: supple w/o LA CV: rrr. PULM: ctab, no inc wob ABD: soft, +bs EXT: no edema SKIN: no acute rash

## 2017-09-25 NOTE — Patient Instructions (Signed)
Cut back on the metformin by 1-2 pills and see if that helps with the GI troubles.  Update Dr. Cruzita Lederer if that helps.  I'll await the brain scan results.  Take care.  Glad to see you.

## 2017-09-25 NOTE — Progress Notes (Signed)
Subjective:   Walter Harris is a 72 y.o. male who presents for Medicare Annual/Subsequent preventive examination.  Review of Systems:  N/A Cardiac Risk Factors include: advanced age (>53men, >76 women);diabetes mellitus;male gender;dyslipidemia;hypertension     Objective:    Vitals: BP 126/70 (BP Location: Right Arm, Patient Position: Sitting, Cuff Size: Normal)   Pulse 72   Temp (!) 97.5 F (36.4 C) (Oral)   Ht 5\' 8"  (1.727 m) Comment: no shoes  Wt 179 lb (81.2 kg)   SpO2 99%   BMI 27.22 kg/m   Body mass index is 27.22 kg/m.  Advanced Directives 09/25/2017 03/14/2016 09/27/2014 02/16/2014 05/12/2013  Does Patient Have a Medical Advance Directive? Yes Yes Yes Patient has advance directive, copy not in chart Patient does not have advance directive  Type of Advance Directive [place];Living will [place];Living will [place];Living will Living will -  Does patient want to make changes to medical advance directive? - No - Patient declined No - Patient declined - -  Copy of [place] in Chart? No - copy requested No - copy requested No - copy requested Copy requested from family -    Tobacco Social History   Tobacco Use  Smoking Status Former Smoker  . Packs/day: 0.50  . Years: 40.00  . Pack years: 20.00  . Types: Pipe  . Last attempt to quit: 06/09/2016  . Years since quitting: 1.2  Smokeless Tobacco Never Used     Counseling given: No   Clinical Intake:  Pre-visit preparation completed: Yes  Pain : No/denies pain Pain Score: 3      Nutritional Status: BMI 25 -29 Overweight Nutritional Risks: None Diabetes: Yes CBG done?: No Did pt. bring in CBG monitor from home?: No  How often do you need to have someone help you when you read instructions, pamphlets, or other written materials from your doctor or pharmacy?: 1 - Never What is the last grade level you completed in school?: 12th grade  + 6 yrs technical college courses  Interpreter Needed?: No  Comments: pt lives with spouse Information entered by :: LPinson, LPN  Past Medical History:  Diagnosis Date  . Basal cell carcinoma    multiple  . Cataracts, both eyes   . Complication of anesthesia    post-operative cognitive dysfunction after ACDF on 05/01/11 [facility])  . 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 Diabetes mellitus   . DJD (degenerative joint disease)    lumbar spine  . Hearing impaired    hearing aids-transmitter rt   . History of blood transfusion    as a baby in 35  . History of colonic polyps   . Hyperlipidemia   . Hypertension    pt. denies high blood pressure  . Kidney stone 1967  . Nose fracture    3x  . OCD (obsessive compulsive disorder)   . Stroke [facility])    "mini stroke, that's what gave me the tremors"  . Stuttering   . Tremor, essential   . Wears glasses    Past Surgical History:  Procedure Laterality Date  . APPENDECTOMY  1960  . BACK SURGERY  2012   lumbar surgery  . CERVICAL FUSION  1990  . CERVICAL FUSION  8/12   multiple levels with plates  . CHOLESTEATOMA EXCISION  1993  . COLLATERAL LIGAMENT REPAIR, ELBOW     bilateral, nerve improvement left 08/04, right 09/04  .  COLONOSCOPY    . CYST EXCISION     back of cervical spine total of 5 surgeries  . EXTERNAL EAR SURGERY     multiple ear surguries as a child  . OTHER SURGICAL HISTORY     benign head tumor#5  . OTHER SURGICAL HISTORY     sebaceous cysts-post neck x5  . SHOULDER ARTHROSCOPY W/ ACROMIAL REPAIR  08/2004   left shoulder  . TONSILLECTOMY    . ULNAR NERVE TRANSPOSITION Right 05/13/2013   Procedure: RIGHT ULNAR NERVE DECOMPRESSION/TRANSPOSITION;  Surgeon: [name]., MD;  Location: [facility];  Service: Orthopedics;  Laterality: Right;   Family History  Problem Relation Age of Onset  . OCD Mother   . Bipolar disorder Mother   . Cancer Mother   . 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 Arthritis Sister   . OCD Sister   . Colon cancer Neg Hx   . Prostate cancer Neg Hx    Social History   Socioeconomic History  . Marital status: Married    Spouse name: None  . Number of children: 1  . Years of education: None  . Highest education level: None  Social Needs  . Financial resource strain: None  . Food insecurity - worry: None  . Food insecurity - inability: None  . Transportation needs - medical: None  . Transportation needs - non-medical: None  Occupational History  . Occupation: disabled    Employer: RETIRED    CommentDatabase administrator  Tobacco Use  . Smoking status: Former Smoker    Packs/day: 0.50    Years: 40.00    Pack years: 20.00    Types: Pipe    Last attempt to quit: 06/09/2016    Years since quitting: 1.2  . Smokeless tobacco: Never Used  Substance and Sexual Activity  . Alcohol use: No    Alcohol/week: 0.0 oz  . Drug use: No  . Sexual activity: No  Other Topics Concern  . None  Social History Narrative   Married, 1992   1 biological child, some contact as of 2017   Wife has 2 kids, no contact as of 2017   Prev worked as Designer, television/film set, Lobbyist, etc    Outpatient Encounter Medications as of 09/25/2017  Medication Sig  . ALPRAZolam (XANAX) 0.5 MG tablet TAKE 1/2 TO 1 TABLET BY MOUTH TWICE DAILY AS NEEDED FOR ANXIETY  . FLUoxetine (PROZAC) 10 MG capsule Take 1-2 capsules (10-20 mg total) by mouth daily.  . fluticasone (FLONASE) 50 MCG/ACT nasal spray PLACE 1-2 SPRAYS INTO BOTH NOSTRILS DAILY  . Insulin Glargine (LANTUS SOLOSTAR) 100 UNIT/ML Solostar Pen Inject under skin 25-28 UNITS DAILY AS DIRECTED  . metFORMIN (GLUCOPHAGE-XR) 500 MG 24 hr tablet Take 1 tablet 3x a day  . Multiple Vitamin (MULTIVITAMIN) tablet Take 1 tablet by mouth daily.  Marland Kitchen oxyCODONE-acetaminophen (PERCOCET/ROXICET) 5-325 MG tablet Take 1 tablet by mouth every 6 (six) hours as needed (pain).  . traZODone (DESYREL) 50 MG tablet TAKE 1 AND  1/2 TABLETS AT BEDTIME AS NEEDED FOR SLEEP   No facility-administered encounter medications on file as of 09/25/2017.     Activities of Daily Living In your present state of health, do you have any difficulty performing the following activities: 09/25/2017  Hearing? Y  Vision? N  Difficulty concentrating or making decisions? N  Walking or climbing stairs? N  Dressing or bathing? N  Doing errands,  shopping? N  Preparing Food and eating ? N  Using the Toilet? N  In the past six months, have you accidently leaked urine? Y  Do you have problems with loss of bowel control? Y  Managing your Medications? N  Managing your Finances? N  Housekeeping or managing your Housekeeping? N  Some recent data might be hidden    Patient Care Team: [name], MD as PCP - General (Family Medicine) Kary Kos, MD as Consulting Physician (Neurosurgery) Estill Cotta, MD as Referring Physician (Ophthalmology) Oneta Rack, MD as Referring Physician (Dermatology)   Assessment:   This is a routine wellness examination for Tymir.  Hearing Screening Comments: Bilateral hearing aids Vision Screening Comments: Last vision exam in Nov 2018 with Dr. Sandra Cockayne   Exercise Activities and Dietary recommendations Current Exercise Habits: Home exercise routine, Type of exercise: walking, Time (Minutes): 20, Frequency (Times/Week): 3, Weekly Exercise (Minutes/Week): 60, Intensity: Mild, Exercise limited by: orthopedic condition(s)  Goals    . DIET - INCREASE WATER INTAKE     Starting 09/25/2017, I will attempt to drink at least 6-8 glasses of water daily.        Fall Risk Fall Risk  09/25/2017 01/06/2017 03/14/2016 03/07/2015  Falls in the past year? No Yes No No  Number falls in past yr: - 2 or more - -  Injury with Fall? - No - -  Risk Factor Category  - High Fall Risk - -  Follow up - Falls evaluation completed - -   Depression Screen PHQ 2/9 Scores 09/25/2017 03/14/2016 03/07/2015  PHQ - 2  Score 0 0 1  PHQ- 9 Score 0 - -    Cognitive Function MMSE - Mini Mental State Exam 09/25/2017 03/14/2016  Orientation to time 5 5  Orientation to Place 5 5  Registration 3 3  Attention/ Calculation 0 0  Recall 3 3  Language- name 2 objects 0 0  Language- repeat 1 1  Language- follow 3 step command 3 0  Language- follow 3 step command-comments - due to hx of stroke, pt is unable to write  Language- read & follow direction 0 0  Write a sentence 0 0  Copy design 0 0  Total score 20 17       PLEASE NOTE: A Mini-Cog screen was completed. Maximum score is 20. A value of 0 denotes this part of [place] MMSE was not completed or the patient failed this part of the Mini-Cog screening.   Mini-Cog Screening Orientation to Time - Max 5 pts Orientation to Place - Max 5 pts Registration - Max 3 pts Recall - Max 3 pts Language Repeat - Max 1 pts Language Follow 3 Step Command - Max 3 pts   Immunization History  Administered Date(s) Administered  . Influenza Split 07/10/2011, 05/28/2012  . Influenza Whole 06/08/2008, 05/29/2009, 05/22/2010  . Influenza-Unspecified 06/30/2015, 07/12/2016, 06/13/2017  . Pneumococcal Conjugate-13 09/09/2014  . Pneumococcal Polysaccharide-23 02/19/2010, 01/03/2016  . Td 11/24/2006  . Tdap 12/13/2016  . Zoster 12/22/2009   Screening Tests Health Maintenance  Topic Date Due  . HEMOGLOBIN A1C  02/02/2018  . OPHTHALMOLOGY EXAM  02/12/2018  . FOOT EXAM  04/18/2018  . COLONOSCOPY  04/14/2022  . TETANUS/TDAP  12/14/2026  . INFLUENZA VACCINE  Completed  . Hepatitis C Screening  Completed  . PNA vac Low Risk Adult  Completed      Plan:     I have personally reviewed, addressed, and noted the following in the patient's  chart:  A. Medical and social history B. Use of alcohol, tobacco or illicit drugs  C. Current medications and supplements D. Functional ability and status E.  Nutritional status F.  Physical activity G. Advance directives H. List of  other physicians I.  Hospitalizations, surgeries, and ER visits in previous 12 months J.  [place] to include hearing, vision, cognitive, depression L. Referrals and appointments - none  In addition, I have reviewed and discussed with patient certain preventive protocols, quality metrics, and best practice recommendations. A written personalized care plan for preventive services as well as general preventive health recommendations were provided to patient.  See attached scanned questionnaire for additional information.   Signed,   Lindell Noe, MHA, BS, LPN Health Coach

## 2017-09-25 NOTE — Progress Notes (Signed)
PCP notes:   Health maintenance:  No gaps identified.  Abnormal screenings:   None  Patient concerns:   Patient is concerned with tremors. States it affects his daily life and ability to do any hobbies.   Nurse concerns:  None  Next PCP appt:   09/25/2017 @ 1400  I reviewed health advisor's note, was available for consultation on the day of service listed in this note, and agree with documentation and plan. Elsie Stain, MD.

## 2017-09-25 NOTE — Patient Instructions (Signed)
Walter Harris , Thank you for taking time to come for your Medicare Wellness Visit. I appreciate your ongoing commitment to your health goals. Please review the following plan we discussed and let me know if I can assist you in the future.   These are the goals we discussed: Goals    . DIET - INCREASE WATER INTAKE     Starting 09/25/2017, I will attempt to drink at least 6-8 glasses of water daily.        This is a list of the screening recommended for you and due dates:  Health Maintenance  Topic Date Due  . Hemoglobin A1C  02/02/2018  . Eye exam for diabetics  02/12/2018  . Complete foot exam   04/18/2018  . Colon Cancer Screening  04/14/2022  . Tetanus Vaccine  12/14/2026  . Flu Shot  Completed  .  Hepatitis C: One time screening is recommended by Center for Disease Control  (CDC) for  adults born from [birth year] through 1964.   Completed  . Pneumonia vaccines  Completed   Preventive Care for Adults  A healthy lifestyle and preventive care can promote health and wellness. Preventive health guidelines for adults include the following key practices.  . A routine yearly physical is a good way to check with your health care provider about your health and preventive screening. It is a chance to share any concerns and updates on your health and to receive a thorough exam.  . Visit your dentist for a routine exam and preventive care every 6 months. Brush your teeth twice a day and floss once a day. Good oral hygiene prevents tooth decay and gum disease.  . The frequency of eye exams is based on your age, health, family medical history, use  of contact lenses, and other factors. Follow your health care provider's recommendations for frequency of eye exams.  . Eat a healthy diet. Foods like vegetables, fruits, whole grains, low-fat dairy products, and lean protein foods contain the nutrients you need without too many calories. Decrease your intake of foods high in solid fats, added sugars, and  salt. Eat the right amount of calories for you. Get information about a proper diet from your health care provider, if necessary.  . Regular physical exercise is one of the most important things you can do for your health. Most adults should get at least 150 minutes of moderate-intensity exercise (any activity that increases your heart rate and causes you to sweat) each week. In addition, most adults need muscle-strengthening exercises on 2 or more days a week.  Silver Sneakers may be a benefit available to you. To determine eligibility, you may visit the website: www.silversneakers.com or contact program at 702-678-4041 Mon-Fri between 8AM-8PM.   . Maintain a healthy weight. The body mass index (BMI) is a screening tool to identify possible weight problems. It provides an estimate of body fat based on height and weight. Your health care provider can find your BMI and can help you achieve or maintain a healthy weight.   For adults 20 years and older: ? A BMI below 18.5 is considered underweight. ? A BMI of 18.5 to 24.9 is normal. ? A BMI of 25 to 29.9 is considered overweight. ? A BMI of 30 and above is considered obese.   . Maintain normal blood lipids and cholesterol levels by exercising and minimizing your intake of saturated fat. Eat a balanced diet with plenty of fruit and vegetables. Blood tests for lipids and cholesterol should  begin at age 71 and be repeated every 5 years. If your lipid or cholesterol levels are high, you are over 50, or you are at high risk for heart disease, you may need your cholesterol levels checked more frequently. Ongoing high lipid and cholesterol levels should be treated with medicines if diet and exercise are not working.  . If you smoke, find out from your health care provider how to quit. If you do not use tobacco, please do not start.  . If you choose to drink alcohol, please do not consume more than 2 drinks per day. One drink is considered to be 12 ounces  (355 mL) of beer, 5 ounces (148 mL) of wine, or 1.5 ounces (44 mL) of liquor.  . If you are 7-22 years old, ask your health care provider if you should take aspirin to prevent strokes.  . Use sunscreen. Apply sunscreen liberally and repeatedly throughout the day. You should seek shade when your shadow is shorter than you. Protect yourself by wearing long sleeves, pants, a wide-brimmed hat, and sunglasses year round, whenever you are outdoors.  . Once a month, do a whole body skin exam, using a mirror to look at the skin on your back. Tell your health care provider of new moles, moles that have irregular borders, moles that are larger than a pencil eraser, or moles that have changed in shape or color.

## 2017-09-26 NOTE — Assessment & Plan Note (Signed)
Indication for chronic opioid:  Chronic neck and back pain Medication and dose: oxycodon 5/325mg  # pills per month: #60 per rx but that usually lasts >1 month.   Last UDS date: 03/14/16 Pain contract signed (Y/N):  yes Date narcotic database last reviewed (include red flags): 09/25/17 He gets some relief with pain med, not using med daily usually.  He is trying to stretch his rx.  No ADE on med.   Continue as is.  He agrees.  Update me as needed.

## 2017-09-26 NOTE — Assessment & Plan Note (Signed)
Advance directive- wife designated if patient were incapacitated.  

## 2017-09-26 NOTE — Assessment & Plan Note (Signed)
Improved with more water intake and slightly higher salt intake.  Continue as is.  He does not have significant peripheral edema.

## 2017-09-26 NOTE — Assessment & Plan Note (Signed)
He may be having diarrhea from the higher dose of metformin.  Reasonable to likely cut this back by 1 or 2 pills/day and monitor his sugar and his GI upset in the meantime.  I asked him to update endocrine later on.  He agrees.

## 2017-09-26 NOTE — Assessment & Plan Note (Signed)
His mood is some better.  He felt that the Prozac was helping overall.  No adverse effect of medication.  He uses Xanax as needed, without adverse effect.  Continue both medications as he has been taking them.

## 2017-09-26 NOTE — Assessment & Plan Note (Signed)
He is seen neurology in the meantime.  He is trying to put it with his symptoms as best he can.  He has follow-up brain imaging pending and I will await those results and outside clinic notes.  He agrees. >25 minutes spent in face to face time with patient, >50% spent in counselling or coordination of care, discussed with patient in detail about his chronic pain, his mood, his tremor.

## 2017-09-29 ENCOUNTER — Encounter: Payer: Self-pay | Admitting: Internal Medicine

## 2017-09-30 ENCOUNTER — Other Ambulatory Visit: Payer: Self-pay | Admitting: Neurosurgery

## 2017-09-30 ENCOUNTER — Other Ambulatory Visit: Payer: Self-pay | Admitting: Internal Medicine

## 2017-09-30 DIAGNOSIS — R251 Tremor, unspecified: Secondary | ICD-10-CM

## 2017-09-30 MED ORDER — INSULIN GLARGINE 100 UNIT/ML SOLOSTAR PEN: PEN_INJECTOR | SUBCUTANEOUS | 11 refills | Status: DC

## 2017-10-03 ENCOUNTER — Encounter: Payer: Self-pay | Admitting: Internal Medicine

## 2017-10-06 ENCOUNTER — Ambulatory Visit
Admission: RE | Admit: 2017-10-06 | Discharge: 2017-10-06 | Disposition: A | Discharge status: Home / Self Care | Payer: Medicare HMO | Source: Ambulatory Visit | Attending: Neurosurgery | Admitting: Neurosurgery

## 2017-10-06 DIAGNOSIS — G319 Degenerative disease of nervous system, unspecified: Secondary | ICD-10-CM | POA: Insufficient documentation

## 2017-10-06 DIAGNOSIS — R9082 White matter disease, unspecified: Secondary | ICD-10-CM | POA: Diagnosis not present

## 2017-10-06 DIAGNOSIS — R251 Tremor, unspecified: Secondary | ICD-10-CM | POA: Diagnosis not present

## 2017-10-06 DIAGNOSIS — R42 Dizziness and giddiness: Secondary | ICD-10-CM | POA: Diagnosis not present

## 2017-10-16 ENCOUNTER — Encounter: Payer: Self-pay | Admitting: Internal Medicine

## 2017-10-17 ENCOUNTER — Other Ambulatory Visit: Payer: Self-pay | Admitting: Internal Medicine

## 2017-10-17 MED ORDER — GLIPIZIDE ER 2.5 MG PO TB24: 2.5000 mg | ORAL_TABLET | Freq: Every day | ORAL | 5 refills | Status: DC

## 2017-10-21 DIAGNOSIS — R251 Tremor, unspecified: Secondary | ICD-10-CM | POA: Diagnosis not present

## 2017-11-06 ENCOUNTER — Encounter: Payer: Self-pay | Admitting: Internal Medicine

## 2017-11-06 ENCOUNTER — Other Ambulatory Visit: Payer: Self-pay | Admitting: Internal Medicine

## 2017-11-06 ENCOUNTER — Other Ambulatory Visit: Payer: Self-pay | Admitting: Family Medicine

## 2017-11-06 MED ORDER — GLIPIZIDE ER 2.5 MG PO TB24: 7.5000 mg | ORAL_TABLET | Freq: Every day | ORAL | 3 refills | Status: DC

## 2017-11-06 NOTE — Telephone Encounter (Signed)
Pt has f/u apt 12/03/17. Please advise. Thanks.

## 2017-11-06 NOTE — Telephone Encounter (Signed)
Last filled: 08/26/17 Last OV: 09/25/17 Next OV: 10/01/18

## 2017-11-07 NOTE — Telephone Encounter (Signed)
Sent. Thanks.   

## 2017-11-13 ENCOUNTER — Encounter: Payer: Self-pay | Admitting: Family Medicine

## 2017-11-14 MED ORDER — OXYCODONE-ACETAMINOPHEN 5-325 MG PO TABS: 1.0000 | ORAL_TABLET | Freq: Four times a day (QID) | ORAL | 0 refills | Status: DC | PRN

## 2017-11-14 NOTE — Telephone Encounter (Signed)
Sent. Thanks.   

## 2017-11-20 DIAGNOSIS — Z08 Encounter for follow-up examination after completed treatment for malignant neoplasm: Secondary | ICD-10-CM | POA: Diagnosis not present

## 2017-11-20 DIAGNOSIS — L570 Actinic keratosis: Secondary | ICD-10-CM | POA: Diagnosis not present

## 2017-11-20 DIAGNOSIS — X32XXXA Exposure to sunlight, initial encounter: Secondary | ICD-10-CM | POA: Diagnosis not present

## 2017-11-20 DIAGNOSIS — L821 Other seborrheic keratosis: Secondary | ICD-10-CM | POA: Diagnosis not present

## 2017-11-20 DIAGNOSIS — D225 Melanocytic nevi of trunk: Secondary | ICD-10-CM | POA: Diagnosis not present

## 2017-11-20 DIAGNOSIS — Z85828 Personal history of other malignant neoplasm of skin: Secondary | ICD-10-CM | POA: Diagnosis not present

## 2017-11-24 ENCOUNTER — Encounter: Payer: Self-pay | Admitting: Family Medicine

## 2017-11-26 ENCOUNTER — Ambulatory Visit: Payer: Self-pay | Admitting: Neurology

## 2017-12-03 ENCOUNTER — Encounter: Payer: Self-pay | Admitting: Family Medicine

## 2017-12-03 ENCOUNTER — Encounter: Payer: Self-pay | Admitting: Internal Medicine

## 2017-12-03 ENCOUNTER — Ambulatory Visit (INDEPENDENT_AMBULATORY_CARE_PROVIDER_SITE_OTHER): Payer: Medicare HMO | Admitting: Family Medicine

## 2017-12-03 ENCOUNTER — Ambulatory Visit: Payer: PPO | Admitting: Internal Medicine

## 2017-12-03 VITALS — BP 124/74 | HR 75 | Ht 68.0 in | Wt 189.2 lb

## 2017-12-03 DIAGNOSIS — E1165 Type 2 diabetes mellitus with hyperglycemia: Secondary | ICD-10-CM

## 2017-12-03 DIAGNOSIS — R202 Paresthesia of skin: Secondary | ICD-10-CM | POA: Diagnosis not present

## 2017-12-03 DIAGNOSIS — E1142 Type 2 diabetes mellitus with diabetic polyneuropathy: Secondary | ICD-10-CM

## 2017-12-03 DIAGNOSIS — IMO0002 Reserved for concepts with insufficient information to code with codable children: Secondary | ICD-10-CM

## 2017-12-03 LAB — POCT GLYCOSYLATED HEMOGLOBIN (HGB A1C): HEMOGLOBIN A1C: 7.4

## 2017-12-03 MED ORDER — EMPAGLIFLOZIN 10 MG PO TABS: 10.0000 mg | ORAL_TABLET | Freq: Every day | ORAL | 11 refills | Status: DC

## 2017-12-03 MED ORDER — GABAPENTIN 100 MG PO CAPS: 100.0000 mg | ORAL_CAPSULE | Freq: Three times a day (TID) | ORAL | 3 refills | Status: DC | PRN

## 2017-12-03 NOTE — Patient Instructions (Signed)
Try 1 gabapentin initially.  Gradually work up to 1-2 tabs up to 3 times a day.  Keep the appointment with Dr. Jannifer Franklin.  I'll await his notes.   Update me as needed.  Take care.  Glad to see you.

## 2017-12-03 NOTE — Assessment & Plan Note (Signed)
Discussed with patient.  He may have multiple issues that could respond to the same treatment.  He has known degenerative changes in his neck and also in his thoracic spine.  He may have occipital neuralgia and/or cervical nerve irritation causing the changes in sensation on the left side of his scalp.  He has episodic pain/change in sensation in the skin on the trunk that is suggestive of neuropathic etiology.  Reasonable to try 1 gabapentin initially.  Gradually work up to 1-2 tabs up to 3 times a day.  He'll keep the appointment with Dr. Jannifer Franklin.  I'll await his notes.  Pt will update me as needed.  I appreciate the help of all involved. >25 minutes spent in face to face time with patient, >50% spent in counselling or coordination of care.

## 2017-12-03 NOTE — Progress Notes (Signed)
He is cutting back on insulin, per endo, down to 24 units daily currently.  He is off metformin in the meantime.  I'll defer to endo.    He has L sided neck and scalp tingling.  Pins and needles sensation on the L posterior side, posterior to the L ear.  No rash.  No such sx on the R side.    He still has tremor at baseline.    He has episodic trunk pain where the skin can be highly sensitive to light touch with sig pain, along dermatomal distribution, on either side of the trunk.  He had relatively recent neurosurgery eval.  He has f/u with Dr. Jannifer Franklin in the near future with neurology.    =============================== Prev neck imaging with: 1. No acute osseous abnormality 2. Post- operative changes C4 through C7 with hardware present at C4-C5 and C6-C7. Mild degenerative changes at C2-C3 and C3-C4.  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  Slight degenerative facet arthritis in the upper thoracic spine.  ===============================  PMH and SH reviewed  ROS: Per HPI unless specifically indicated in ROS section   Meds, vitals, and allergies reviewed.  Baseline tremor noted.  Speech at baseline. nad ncat Mmm Neck supple, no LA rrr ctab abd soft, not ttp L side of scalp has altered sensation (more sx with looking down) He has altered sensation to light touch on the R side of the back compared to the L side.  He has baseline L arm paresthesia from prev surgery.   No weakness in the bilateral upper extremities otherwise.  Able to bear weight on the bilateral lower extremities.

## 2017-12-03 NOTE — Patient Instructions (Addendum)
Please decrease: - Lantus to 24 units at bedtime  Please continue: - Glipizide ER 7.5 mg daily before b'fast  Please try to start: - Jardiance 10 mg daily before b'fast  Please come back for a follow-up appointment in 3 months.

## 2017-12-03 NOTE — Progress Notes (Signed)
Patient ID: Walter Harris, male   DOB: May 05, 1946, 72 y.o.   MRN: [id number]   HPI: Walter Harris is a 72 y.o.-year-old male,  DM2, dx in ~2007, insulin-dependent soon after dx, uncontrolled, with complications (PN). He is here with his wife offers part of the history. Last visit 4 months ago.  Last hemoglobin A1c was: Lab Results  Component Value Date   HGBA1C 6.9 08/05/2017   HGBA1C 6.9 05/02/2017   HGBA1C 7.3 01/21/2017   Pt is on a regimen of: - Lantus 32 >> 35 >> 30 >> 30-31 >> 27-28 units at bedtime - Glipizide ER 7.5 mg in am We stopped Metformin ER - diarrhea (stopped 10/14/2017) He was on Invokana 150 (1/2 of 300 mg) daily in am >> stopped since last visit - 1 mo ago. He stopped Metformin >> GI sx  He stopped Actos >> mm cramps, generalized pain.  Pt checks his sugars 2x a day: - am: 71, 79, 82-117 >> 63, 74-108 > 56, 63-127 - 2h after b'fast: 133-155 >> 101-120 >> 122-148 - before lunch: 131-164, 176 >> 81, 99, 100-147 >> 113-134 - 2h after lunch: 157-179, 201 >> 118-148 >> 136-148 - before dinner: 144-147 >> 112-121, 143 >> 133-166 (snack) - 2h after dinner: 161-186 >> 122-161 >> 10-172 - bedtime:140-163 >> 130, 162 >> 150-166, 177 - nighttime: n/c >> 99-116 >> n/c >> 54 >> 59 Lowest sugar was 71 >> 54 >> 56; he has hypoglycemia awareness in the 70s. Highest sugar was 201 >> 148 >> 177.  Glucometer: [employer]. He obtains strips on [employer] ($11 versus $60 in the pharmacy).   Pt's meals are: - Breakfast: oatmeal, bacon + 1 egg, toast - Lunch: sandwich or soup - Dinner: meat + 2 veggies - Snacks: 2 or 3   By his scale at home, he lost 60 lbs in ~1.5 years.  He is mostly a vegetarian and not eating fried foods.  - nO CKD, last BUN/creatinine:  Lab Results  Component Value Date   BUN 15 04/18/2017   BUN 12 08/08/2016   CREATININE 0.92 04/18/2017   CREATININE 0.90 10/09/2016   - + HL. last set of lipids: Lab Results  Component Value Date   CHOL 206 (H)  08/05/2017   HDL 52.50 08/05/2017   LDLCALC 129 (H) 08/05/2017   LDLDIRECT 160.2 06/01/2012   TRIG 123.0 08/05/2017   CHOLHDL 4 08/05/2017   - last eye exam wa 02/2017: No DR - no numbness and tingling in his feet.  He has pbs with his children, whom he did not see in a long time as they cut ties with him.   ROS: Constitutional: no weight gain/no weight loss, no fatigue, no subjective hyperthermia, no subjective hypothermia Eyes: no blurry vision, no xerophthalmia ENT: no sore throat, no nodules palpated in throat, no dysphagia, no odynophagia, no hoarseness Cardiovascular: no CP/no SOB/no palpitations/no leg swelling Respiratory: no cough/no SOB/no wheezing Gastrointestinal: no N/no V/no D/no C/no acid reflux Musculoskeletal: no muscle aches/no joint aches Skin: no rashes, no hair loss Neurological: + tremors/no numbness/no tingling/no dizziness  I reviewed pt's medications, allergies, PMH, social hx, family hx, and changes were documented in the history of present illness. Otherwise, unchanged from my initial visit note.   Past Medical History:  Diagnosis Date  . Basal cell carcinoma    multiple  . Cataracts, both eyes   . Complication of anesthesia    post-operative cognitive dysfunction after ACDF on 05/01/11 [facility])  .  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 Diabetes mellitus   . DJD (degenerative joint disease)    lumbar spine  . Hearing impaired    hearing aids-transmitter rt   . History of blood transfusion    as a baby in 53  . History of colonic polyps   . Hyperlipidemia   . Hypertension    pt. denies high blood pressure  . Kidney stone 1967  . Nose fracture    3x  . OCD (obsessive compulsive disorder)   . Stroke [facility])    "mini stroke, that's what gave me the tremors"  . Stuttering   . Tremor, essential   . Wears glasses    Past Surgical History:  Procedure Laterality Date  . APPENDECTOMY  1960  . BACK SURGERY  2012   lumbar surgery  . CERVICAL  FUSION  1990  . CERVICAL FUSION  8/12   multiple levels with plates  . CHOLESTEATOMA EXCISION  1993  . COLLATERAL LIGAMENT REPAIR, ELBOW     bilateral, nerve improvement left 08/04, right 09/04  . COLONOSCOPY    . CYST EXCISION     back of cervical spine total of 5 surgeries  . EXTERNAL EAR SURGERY     multiple ear surguries as a child  . OTHER SURGICAL HISTORY     benign head tumor#5  . OTHER SURGICAL HISTORY     sebaceous cysts-post neck x5  . SHOULDER ARTHROSCOPY W/ ACROMIAL REPAIR  08/2004   left shoulder  . TONSILLECTOMY    . ULNAR NERVE TRANSPOSITION Right 05/13/2013   Procedure: RIGHT ULNAR NERVE DECOMPRESSION/TRANSPOSITION;  Surgeon: [name]., MD;  Location: [place];  Service: Orthopedics;  Laterality: Right;   Social History   Socioeconomic History  . Marital status: Married    Spouse name: Not on file  . Number of children: 1  . Years of education: Not on file  . Highest education level: Not on file  Occupational History  . Occupation: disabled    Employer: RETIRED    CommentDatabase administrator  Social Needs  . Financial resource strain: Not on file  . Food insecurity:    Worry: Not on file    Inability: Not on file  . Transportation needs:    Medical: Not on file    Non-medical: Not on file  Tobacco Use  . Smoking status: Former Smoker    Packs/day: 0.50    Years: 40.00    Pack years: 20.00    Types: Pipe    Last attempt to quit: 06/09/2016    Years since quitting: 1.4  . Smokeless tobacco: Never Used  Substance and Sexual Activity  . Alcohol use: No    Alcohol/week: 0.0 oz  . Drug use: No  . Sexual activity: Never  Lifestyle  . Physical activity:    Days per week: Not on file    Minutes per session: Not on file  . Stress: Not on file  Relationships  . Social connections:    Talks on phone: Not on file    Gets together: Not on file    Attends religious service: Not on file    Active member of club or organization: Not on  file    Attends meetings of clubs or organizations: Not on file    Relationship status: Not on file  . Intimate partner violence:    Fear of current or ex partner: Not on file    Emotionally  abused: Not on file    Physically abused: Not on file    Forced sexual activity: Not on file  Other Topics Concern  . Not on file  Social History Narrative   Married, 1992   1 biological child, some contact as of 2017   Wife has 2 kids, no contact as of 2017   Prev worked as Designer, television/film set, Lobbyist, etc   Current Outpatient Medications on File Prior to Visit  Medication Sig Dispense Refill  . ALPRAZolam (XANAX) 0.5 MG tablet TAKE 1/2 TO 1 TABLET BY MOUTH TWICE DAILY AS NEEDED FOR ANXIETY 60 tablet 1  . FLUoxetine (PROZAC) 10 MG capsule Take 1-2 capsules (10-20 mg total) by mouth daily. 180 capsule 3  . fluticasone (FLONASE) 50 MCG/ACT nasal spray PLACE 1-2 SPRAYS INTO BOTH NOSTRILS DAILY 16 g 3  . glipiZIDE (GLUCOTROL XL) 2.5 MG 24 hr tablet Take 3 tablets (7.5 mg total) by mouth daily before breakfast. 270 tablet 3  . Insulin Glargine (LANTUS SOLOSTAR) 100 UNIT/ML Solostar Pen Inject under skin up to 50 UNITS DAILY AS DIRECTED 15 mL 11  . metFORMIN (GLUCOPHAGE-XR) 500 MG 24 hr tablet Take 1 tablet 3x a day 270 tablet 3  . Multiple Vitamin (MULTIVITAMIN) tablet Take 1 tablet by mouth daily.    Marland Kitchen oxyCODONE-acetaminophen (PERCOCET/ROXICET) 5-325 MG tablet Take 1 tablet by mouth every 6 (six) hours as needed (pain). 60 tablet 0  . traZODone (DESYREL) 50 MG tablet TAKE 1 AND 1/2 TABLETS AT BEDTIME AS NEEDED FOR SLEEP 135 tablet 3   No current facility-administered medications on file prior to visit.    Allergies  Allergen Reactions  . Bee Venom Anaphylaxis  . Pravastatin Other (See Comments)    Severe myalgias  . Ace Inhibitors     Cough  . Actos [Pioglitazone] Other (See Comments)    Intolerant, not allergic.   . Angiotensin Receptor Blockers Other (See Comments)    cramps   . Aripiprazole Other (See Comments)    Unknown reaction many years ago  . Crestor [Rosuvastatin Calcium] Other (See Comments)    Aches, even with twice weekly dosing  . Losartan Other (See Comments)    Leg cramps  . Metformin And Related Other (See Comments)    Able to tolerate XR formulation, not allergic.   . Olanzapine Other (See Comments)    Unknown reaction many years ago   . Other Cough    Blood pressure medication caused a cough after one dose  . Pneumovax 23 [Pneumococcal Vac Polyvalent] Other (See Comments)    aches  . Primidone Other (See Comments)    Vertigo at 50mg , able to tolerate 25mg    . Rosuvastatin Calcium   . Bactrim [Sulfamethoxazole-Trimethoprim] Other (See Comments)    Mild reaction per Dr. Silvio Pate, pt does not remember the reaction  . Codeine Phosphate Rash  . Divalproex Sodium Other (See Comments)     tremor  . Lithium Carbonate Other (See Comments)    Tremors    Family History  Problem Relation Age of Onset  . OCD Mother   . Bipolar disorder Mother   . Cancer Mother   . 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 Arthritis Sister   . OCD Sister   . Colon cancer Neg Hx   . Prostate cancer Neg Hx    PE: BP 124/74   Pulse 75   Ht 5\' 8"  (1.727 m)   Wt  189 lb 3.2 oz (85.8 kg)   SpO2 97%   BMI 28.77 kg/m  Wt Readings from Last 3 Encounters:  12/03/17 189 lb 3.2 oz (85.8 kg)  09/25/17 179 lb (81.2 kg)  09/25/17 179 lb (81.2 kg)   Constitutional: overweight, in NAD Eyes: PERRLA, EOMI, no exophthalmos ENT: moist mucous membranes, no thyromegaly, no cervical lymphadenopathy Cardiovascular: RRR, No MRG Respiratory: CTA B Gastrointestinal: abdomen soft, NT, ND, BS+ Musculoskeletal: no deformities, strength intact in all 4 Skin: moist, warm, no rashes Neurological: + tremor with outstretched hands, DTR normal in all 4  ASSESSMENT: 1. DM2, insulin-dependent, uncontrolled, with complications - PN  2. HL  PLAN:  1.  Patient with long-standing, uncontrolled, diabetes, with improved control at last 2 visits, however, at last visit, he had worsening stress due to his wife being sick with exacerbated his tremor and stuttering.  At last visit, sugars were much better to the point of occasional lows at night and therefore in a.m., so we continue to decrease Lantus dose.  He did stop Invokana before last visit and we did not have to restart it as sugars were controlled.  However, he had to stop metformin since last visit since this started to cause him severe diarrhea.  Sugars increased afterwards so he contacted me and we increased glipizide XL to 7.5 mg in a.m.  He also increase the Lantus up to 28 units nightly, but on this dose he has low blood sugars in the morning, down in the 60s and even had 159 blood sugar in the middle of the night. - For now, I advised him to decrease the Lantus to avoid further low blood sugars at night or in a.m., continue glipizide at the current dose, but we also discussed about adding Jardiance at the low dose.  We discussed about possible side effects and also benefits.  We will need to check a BMP at next visit.   - We discussed that he may need to decrease the glipizide if sugars improve after adding Jardiance - I suggested to:  Patient Instructions  Please decrease: - Lantus to 24 units at bedtime  Please continue: - Glipizide ER 7.5 mg daily before b'fast  Please try to start: - Jardiance 10 mg daily before b'fast  Please come back for a follow-up appointment in 3 months.   - today, HbA1c is 7.4% (increased) - continue checking sugars at different times of the day - check 3-4x a day (per his preference), rotating checks - advised for yearly eye exams >> he is UTD - Return to clinic in 3 mo with sugar log    2. HL -LDL has improved at last check in 07/2017, but still above target -He could not tolerate Crestor pravastatin -A PCSK 9 inhibitor would be a helpful  addition  Philemon Kingdom, MD PhD [facility] Endocrinology

## 2017-12-04 ENCOUNTER — Ambulatory Visit: Payer: Medicare HMO | Admitting: Family Medicine

## 2017-12-17 ENCOUNTER — Ambulatory Visit: Payer: Medicare HMO | Admitting: Neurology

## 2017-12-17 ENCOUNTER — Encounter: Payer: Self-pay | Admitting: Neurology

## 2017-12-17 ENCOUNTER — Other Ambulatory Visit: Payer: Self-pay

## 2017-12-17 VITALS — BP 135/80 | HR 72 | Ht 68.5 in | Wt 187.5 lb

## 2017-12-17 DIAGNOSIS — R413 Other amnesia: Secondary | ICD-10-CM | POA: Diagnosis not present

## 2017-12-17 DIAGNOSIS — R251 Tremor, unspecified: Secondary | ICD-10-CM

## 2017-12-17 HISTORY — DX: Other amnesia: R41.3

## 2017-12-17 MED ORDER — PROPRANOLOL HCL ER 60 MG PO CP24: 60.0000 mg | ORAL_CAPSULE | Freq: Every day | ORAL | 3 refills | Status: DC

## 2017-12-17 NOTE — Progress Notes (Signed)
Reason for visit: Tremor, memory disturbance  Referring physician: Dr. Ginette Harris is a 72 y.o. male  History of present illness:  Walter Harris is a 72 year old right-handed white male with a history of a benign essential tremor that has been present for at least 5 or 6 years with some significance.  The patient has been seen and evaluated by Walter Harris in the past, more recently he has seen Walter Harris.  The patient claims that at one point his tremors were very severe, but he has been on a combination of gabapentin, propranolol, and alprazolam which seems to have helped.  He has difficulty performing tasks that require fine motor control such as using a screwdriver.  The patient has difficulty with handwriting and with feeding himself.  He indicates that his mother also had a similar tremor.  The patient has had cervical spine surgery, he has developed some paresthesias and tingling in the occipital nerve distribution on the left within the last month after he bumped his head.  The patient denies any numbness or weakness of the arms or legs, he does have a history of diabetes.  The patient has had some slight gait instability, he has not had any recent falls, he did fall with significant injury 3 years ago.  The patient at times feels that his legs are somewhat weak, but other days he feels much better, he was able to walk 2 miles today before the visit.  The patient reports some troubles with memory that has been present for about 2 years, he has troubles with remembering names for people and some word finding problems.  He does have some short-term memory issues, he is able to operate a motor vehicle without difficulty.  He is able to keep up with medications and appointments.  His wife manages the finances.  The patient is sent to this office for further evaluation.  He has undergone a recent MRI of the brain that shows significant generalized cortical atrophy, mild small vessel ischemic changes  are seen.  Past Medical History:  Diagnosis Date  . Basal cell carcinoma    multiple  . Cataracts, both eyes   . Complication of anesthesia    post-operative cognitive dysfunction after ACDF on 05/01/11 [facility])  . Deaf    right ear  . Diabetes mellitus   . DJD (degenerative joint disease)    lumbar spine  . Hearing impaired    hearing aids-transmitter rt   . History of blood transfusion    as a baby in 11  . History of colonic polyps   . Hyperlipidemia   . Hypertension    pt. denies high blood pressure  . Kidney stone 1967  . Nose fracture    3x  . OCD (obsessive compulsive disorder)   . Stroke [facility])    "mini stroke, that's what gave me the tremors"  . Stuttering   . Tremor, essential   . Wears glasses     Past Surgical History:  Procedure Laterality Date  . APPENDECTOMY  1960  . BACK SURGERY  2012   lumbar surgery  . CERVICAL FUSION  1990  . CERVICAL FUSION  8/12   multiple levels with plates  . CHOLESTEATOMA EXCISION  1993  . COLLATERAL LIGAMENT REPAIR, ELBOW     bilateral, nerve improvement left 08/04, right 09/04  . COLONOSCOPY    . CYST EXCISION     back of cervical spine total of 5 surgeries  .  EXTERNAL EAR SURGERY     multiple ear surguries as a child  . OTHER SURGICAL HISTORY     benign head tumor#5  . OTHER SURGICAL HISTORY     sebaceous cysts-post neck x5  . SHOULDER ARTHROSCOPY W/ ACROMIAL REPAIR  08/2004   left shoulder  . TONSILLECTOMY    . ULNAR NERVE TRANSPOSITION Right 05/13/2013   Procedure: RIGHT ULNAR NERVE DECOMPRESSION/TRANSPOSITION;  Surgeon: [name]., MD;  Location: [place];  Service: Orthopedics;  Laterality: Right;    Family History  Problem Relation Age of Onset  . OCD Mother   . Bipolar disorder Mother   . Cancer Mother   . 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 Arthritis Sister   . OCD Sister   . Colon cancer Neg Hx   . Prostate cancer Neg Hx     Social history:   reports that he quit smoking about 18 months ago. His smoking use included pipe. He has a 20.00 pack-year smoking history. He has never used smokeless tobacco. He reports that he does not drink alcohol or use drugs.  Medications:  Prior to Admission medications   Medication Sig Start Date End Date Taking? Authorizing Provider  ALPRAZolam Duanne Moron) 0.5 MG tablet TAKE 1/2 TO 1 TABLET BY MOUTH TWICE DAILY AS NEEDED FOR ANXIETY 11/07/17  Yes [name], MD  empagliflozin (JARDIANCE) 10 MG TABS tablet Take 10 mg by mouth daily. 12/03/17  Yes Philemon Kingdom, MD  FLUoxetine (PROZAC) 10 MG capsule Take 1-2 capsules (10-20 mg total) by mouth daily. Patient taking differently: Take 10 mg by mouth daily.  08/11/17  Yes [name], MD  fluticasone [facility]) 50 MCG/ACT nasal spray PLACE 1-2 SPRAYS INTO BOTH NOSTRILS DAILY 08/27/17  Yes [name], MD  gabapentin (NEURONTIN) 100 MG capsule Take 1-2 capsules (100-200 mg total) by mouth 3 (three) times daily as needed. Patient taking differently: Take 100 mg by mouth 3 (three) times daily.  12/03/17  Yes [name], MD  glipiZIDE (GLUCOTROL XL) 2.5 MG 24 hr tablet Take 3 tablets (7.5 mg total) by mouth daily before breakfast. 11/06/17  Yes Philemon Kingdom, MD  Insulin Glargine (LANTUS SOLOSTAR) 100 UNIT/ML Solostar Pen Inject under skin up to 50 UNITS DAILY AS DIRECTED 09/30/17  Yes Philemon Kingdom, MD  Multiple Vitamins-Minerals (CENTRUM SILVER 50+MEN PO) Take 1 tablet by mouth daily.   Yes [provider]  oxyCODONE-acetaminophen (PERCOCET/ROXICET) 5-325 MG tablet Take 1 tablet by mouth every 6 (six) hours as needed (pain). 11/14/17  Yes [name], MD  propranolol ER (INDERAL LA) 60 MG 24 hr capsule Take 60 mg by mouth daily.   Yes [provider]  traZODone (DESYREL) 50 MG tablet TAKE 1 AND 1/2 TABLETS AT BEDTIME AS NEEDED FOR SLEEP 01/09/17  Yes [name], MD      Allergies  Allergen Reactions  . Bee Venom  Anaphylaxis  . Pravastatin Other (See Comments)    Severe myalgias  . Ace Inhibitors     Cough  . Actos [Pioglitazone] Other (See Comments)    Intolerant, not allergic.   . Angiotensin Receptor Blockers Other (See Comments)    cramps cramps  . Aripiprazole Other (See Comments)    Unknown reaction many years ago  . Crestor [Rosuvastatin Calcium] Other (See Comments)    Aches, even with twice weekly dosing  . Losartan Other (See Comments)  Leg cramps  . Metformin And Related Other (See Comments)    Able to tolerate XR formulation, not allergic.   . Olanzapine Other (See Comments)    Unknown reaction many years ago   . Other Cough    Blood pressure medication caused a cough after one dose  . Pneumovax 23 [Pneumococcal Vac Polyvalent] Other (See Comments)    aches  . Primidone Other (See Comments)    Vertigo at 50mg , able to tolerate 25mg    . Rosuvastatin Calcium   . Bactrim [Sulfamethoxazole-Trimethoprim] Other (See Comments)    Mild reaction per Dr. Silvio Pate, pt does not remember the reaction  . Codeine Phosphate Rash  . Divalproex Sodium Other (See Comments)     tremor  . Lithium Carbonate Other (See Comments)    Tremors     ROS:  Out of a complete 14 system review of symptoms, the patient complains only of the following symptoms, and all other reviewed systems are negative.  Fatigue Hearing loss, difficulty swallowing Easy bruising Muscle cramps, aching muscles Numbness, difficulty swallowing, tremor Anxiety, decreased energy  Blood pressure 135/80, pulse 72, height 5' 8.5" (1.74 m), weight 187 lb 8 oz (85 kg).  Physical Exam  General: The patient is alert and cooperative at the time of the examination.  Eyes: Pupils are equal, round, and reactive to light. Discs are flat bilaterally.  Neck: The neck is supple, no carotid bruits are noted.  Respiratory: The respiratory examination is clear.  Cardiovascular: The cardiovascular examination reveals a regular  rate and rhythm, no obvious murmurs or rubs are noted.  Skin: Extremities are without significant edema.  Neurologic Exam  Mental status: The patient is alert and oriented x 3 at the time of the examination. The patient has apparent normal recent and remote memory, with an apparently normal attention span and concentration ability.  The Mini-Mental status examination done today shows a total score 29/30.  Cranial nerves: Facial symmetry is present. There is good sensation of the face to pinprick and soft touch bilaterally. The strength of the facial muscles and the muscles to head turning and shoulder shrug are normal bilaterally. Speech is well enunciated, no aphasia or dysarthria is noted. Extraocular movements are full. Visual fields are full. The tongue is midline, and the patient has symmetric elevation of the soft palate. No obvious hearing deficits are noted.  A slight head tremor is seen, side to side.  Motor: The motor testing reveals 5 over 5 strength of all 4 extremities. Good symmetric motor tone is noted throughout.  Sensory: Sensory testing is intact to pinprick, soft touch, vibration sensation, and position sense on all 4 extremities. No evidence of extinction is noted.  Coordination: Cerebellar testing reveals good finger-nose-finger and heel-to-shin bilaterally.  The patient does have intention tremors with finger-nose-finger bilaterally, left greater than right.  Gait and station: Gait is slightly wide-based, the patient can walk independently.  Tandem gait is unsteady.  Romberg is negative. No drift is seen.  Reflexes: Deep tendon reflexes are symmetric, but are slightly depressed bilaterally. Toes are downgoing bilaterally.   MRI brain 10/06/17:  IMPRESSION: 1. No acute intracranial abnormality or significant interval change. 2. Stable atrophy and white matter disease. This likely reflects the sequela of chronic microvascular ischemia.  * MRI scan images were reviewed  online. I agree with the written report.    Assessment/Plan:  1.  Benign essential tremor  2.  Mild memory disturbance   3.  Left occipital neuralgia  The patient  has had a recent B12 level done that was unremarkable.  The patient has gained benefit with the use of alprazolam, gabapentin, and propranolol.  A prescription was sent in for the propranolol.  The patient will be followed for the memory issues.  MRI of the brain does show cortical atrophy.  Jill Alexanders MD 12/17/2017 2:07 PM  [facility] [address] [place], [place] [address]  Phone [phone] Fax [phone]

## 2017-12-29 ENCOUNTER — Encounter: Payer: Self-pay | Admitting: Neurology

## 2017-12-29 ENCOUNTER — Encounter: Payer: Self-pay | Admitting: Family Medicine

## 2017-12-30 ENCOUNTER — Other Ambulatory Visit: Payer: Self-pay | Admitting: *Deleted

## 2017-12-30 MED ORDER — GABAPENTIN 100 MG PO CAPS: 100.0000 mg | ORAL_CAPSULE | Freq: Three times a day (TID) | ORAL | 1 refills | Status: DC | PRN

## 2018-01-01 ENCOUNTER — Other Ambulatory Visit: Payer: Self-pay | Admitting: Family Medicine

## 2018-01-01 MED ORDER — GABAPENTIN 100 MG PO CAPS: 100.0000 mg | ORAL_CAPSULE | Freq: Three times a day (TID) | ORAL | 1 refills | Status: DC | PRN

## 2018-01-20 ENCOUNTER — Other Ambulatory Visit: Payer: Self-pay | Admitting: Family Medicine

## 2018-01-20 ENCOUNTER — Encounter: Payer: Self-pay | Admitting: Internal Medicine

## 2018-01-20 DIAGNOSIS — G8929 Other chronic pain: Secondary | ICD-10-CM

## 2018-01-20 NOTE — Telephone Encounter (Signed)
Electronic refill request Last refill 11/14/17 #60 Last office visit 12/03/17

## 2018-01-21 MED ORDER — OXYCODONE-ACETAMINOPHEN 5-325 MG PO TABS: 1.0000 | ORAL_TABLET | Freq: Four times a day (QID) | ORAL | 0 refills | Status: DC | PRN

## 2018-01-21 NOTE — Telephone Encounter (Signed)
Sent. Thanks.  Indication for chronic opioid:  Chronic neck and back pain Medication and dose: oxycodon 5/325mg  # pills per month: #60 per rx but that usually lasts >1 month.   Last UDS date: 03/14/16 Pain contract signed (Y/N):  yes Date narcotic database last reviewed (include red flags): 01/21/18

## 2018-01-28 ENCOUNTER — Encounter: Payer: Self-pay | Admitting: Family Medicine

## 2018-02-02 ENCOUNTER — Encounter: Payer: Self-pay | Admitting: Internal Medicine

## 2018-02-03 ENCOUNTER — Other Ambulatory Visit: Payer: Self-pay | Admitting: Internal Medicine

## 2018-02-03 MED ORDER — CANAGLIFLOZIN 300 MG PO TABS: 300.0000 mg | ORAL_TABLET | Freq: Every day | ORAL | 11 refills | Status: DC

## 2018-02-11 ENCOUNTER — Encounter: Payer: Self-pay | Admitting: Internal Medicine

## 2018-02-11 ENCOUNTER — Other Ambulatory Visit: Payer: Self-pay | Admitting: Internal Medicine

## 2018-02-11 DIAGNOSIS — E1165 Type 2 diabetes mellitus with hyperglycemia: Principal | ICD-10-CM

## 2018-02-11 DIAGNOSIS — IMO0002 Reserved for concepts with insufficient information to code with codable children: Secondary | ICD-10-CM

## 2018-02-11 DIAGNOSIS — E1142 Type 2 diabetes mellitus with diabetic polyneuropathy: Secondary | ICD-10-CM

## 2018-02-12 ENCOUNTER — Encounter: Payer: Self-pay | Admitting: Family Medicine

## 2018-02-12 ENCOUNTER — Telehealth: Payer: Self-pay | Admitting: Internal Medicine

## 2018-02-12 NOTE — Telephone Encounter (Signed)
I spoke with pt; pt has had dizziness since seen 08/2017; pt was drinking more water and adding salt to all his foodand seemed to be doing better with dizziness; dizziness worsened after Dr Cruzita Lederer started pt on Jardiance; pt took Jardiance for short period and then Dr Cruzita Lederer stopped Vania Rea. Dizziness is not as bad today as yesterday but pt said he does feel wobbly today but the dizziness hits suddenly. But so far today dizziness is not bad.  On 02/11/18 pt remembers being in the yard and then waking up in the ditch;pt is not sure he was unconscious but said he could have been. Today no H/A,CP, SOB and no more weakness in arms and legs than usual.pt has been drinking 96 oz of water a day and salting his food. Pt does not want to see anyone except Dr [name].pt said he only has one time to die and he does not want to see anyone else except Dr [name] because he "likes him so good". Pt has been under stress for the last 2 days and is sore all over from the hard fall he took yesterday. While on phone pt took BP 165/77 P 74.Pt took propranolol 60 mg this morning as usual. Not other BP meds at this time. Pt said he cannot take statins. Pt does not want to go to ED but if pt gets more dizzy he is sure his wife would call 911. Otherwise pt will plan to see Dr [name] on 02/13/18 at 2:15. Today pt will rest and drink plenty of water and add salt to his food. FYI to Dr [name].

## 2018-02-12 NOTE — Telephone Encounter (Signed)
Patient stated he wanted to be referred to  Dr Honor Junes to office which is closer to his home. Patient isn't able to make the drive to Dr Cruzita Lederer office any longer.  Please advise  [facility].  02/11/18 @12 :01pm

## 2018-02-12 NOTE — Telephone Encounter (Signed)
Spoke with pt and told him that his PCP has to do the new referral for a new endocrinologist. Told them about a record release being sent to [place] and they stated they will have that done and will let us know if anything changes

## 2018-02-13 ENCOUNTER — Ambulatory Visit (INDEPENDENT_AMBULATORY_CARE_PROVIDER_SITE_OTHER): Payer: Medicare HMO | Admitting: Family Medicine

## 2018-02-13 ENCOUNTER — Encounter: Payer: Self-pay | Admitting: Family Medicine

## 2018-02-13 VITALS — BP 150/88 | HR 78 | Temp 97.5°F | Ht 68.5 in | Wt 194.0 lb

## 2018-02-13 DIAGNOSIS — R42 Dizziness and giddiness: Secondary | ICD-10-CM | POA: Diagnosis not present

## 2018-02-13 DIAGNOSIS — E1165 Type 2 diabetes mellitus with hyperglycemia: Secondary | ICD-10-CM

## 2018-02-13 DIAGNOSIS — R55 Syncope and collapse: Secondary | ICD-10-CM | POA: Diagnosis not present

## 2018-02-13 DIAGNOSIS — E1142 Type 2 diabetes mellitus with diabetic polyneuropathy: Secondary | ICD-10-CM | POA: Diagnosis not present

## 2018-02-13 DIAGNOSIS — IMO0002 Reserved for concepts with insufficient information to code with codable children: Secondary | ICD-10-CM

## 2018-02-13 MED ORDER — FLUOXETINE HCL 10 MG PO CAPS: 10.0000 mg | ORAL_CAPSULE | Freq: Every day | ORAL | Status: DC

## 2018-02-13 NOTE — Progress Notes (Signed)
His tremor was better with 10mg  prozac, along with xanax and gabapentin.  He can feed himself now.    He needed endo clinic closer to home and I put in the referral.   He was happy with Dr. Cruzita Lederer but it was just too hard to get our clinic.  His lightheadedness had gotten better.  He had some vertigo sx on jardiance- this was different from being lightheaded.   He got vertigo recently and landed in a ditch.  Unclear duration.  He regained his composure and was able to get out.  He has had other episodes that were similar- one about 1 month ago.  He isn't lightheaded during episodes.    He is walking with a cane.  Sugar 107 this AM.  Has been up to 150s in the AM.    No CP, not SOB.  No BLE edema.  In discussing the situation with the patient he was not clear if he passed out or not.  Meds, vitals, and allergies reviewed.   ROS: Per HPI unless specifically indicated in ROS section   GEN: nad, alert and oriented HEENT: mucous membranes moist NECK: supple w/o LA CV: rrr. PULM: ctab, no inc wob ABD: soft, +bs EXT: no edema SKIN: no acute rash Tremor- mild- at baseline.

## 2018-02-13 NOTE — Patient Instructions (Signed)
Go to the lab on the way out.  We'll contact you with your lab report. Gradually cut back on the gabapentin (don't quit cold [place]) and see if that helps with the vertigo symptoms.  Keep drinking plenty of water and update me as needed.   Take care.  Glad to see you.  We will call about your referral.  Rosaria Ferries or Azalee Course will call you if you don't see one of them on the way out.

## 2018-02-14 ENCOUNTER — Other Ambulatory Visit: Payer: Self-pay | Admitting: Family Medicine

## 2018-02-14 LAB — COMPREHENSIVE METABOLIC PANEL
AG RATIO: 1.8 (calc) (ref 1.0–2.5)
ALBUMIN MSPROF: 4.3 g/dL (ref 3.6–5.1)
ALKALINE PHOSPHATASE (APISO): 71 U/L (ref 40–115)
ALT: 19 U/L (ref 9–46)
AST: 18 U/L (ref 10–35)
BILIRUBIN TOTAL: 0.5 mg/dL (ref 0.2–1.2)
BUN/Creatinine Ratio: 16 (calc) (ref 6–22)
BUN: 19 mg/dL (ref 7–25)
CALCIUM: 9.4 mg/dL (ref 8.6–10.3)
CHLORIDE: 103 mmol/L (ref 98–110)
CO2: 24 mmol/L (ref 20–32)
Creat: 1.2 mg/dL — ABNORMAL HIGH (ref 0.70–1.18)
GLOBULIN: 2.4 g/dL (ref 1.9–3.7)
Glucose, Bld: 237 mg/dL — ABNORMAL HIGH (ref 65–99)
POTASSIUM: 4.3 mmol/L (ref 3.5–5.3)
Sodium: 139 mmol/L (ref 135–146)
Total Protein: 6.7 g/dL (ref 6.1–8.1)

## 2018-02-14 LAB — CBC WITH DIFFERENTIAL/PLATELET
BASOS ABS: 37 {cells}/uL (ref 0–200)
Basophils Relative: 0.6 %
EOS ABS: 99 {cells}/uL (ref 15–500)
Eosinophils Relative: 1.6 %
HEMATOCRIT: 44.9 % (ref 38.5–50.0)
HEMOGLOBIN: 15.4 g/dL (ref 13.2–17.1)
LYMPHS ABS: 1612 {cells}/uL (ref 850–3900)
MCH: 30.3 pg (ref 27.0–33.0)
MCHC: 34.3 g/dL (ref 32.0–36.0)
MCV: 88.2 fL (ref 80.0–100.0)
MPV: 10.7 fL (ref 7.5–12.5)
Monocytes Relative: 9.9 %
NEUTROS PCT: 61.9 %
Neutro Abs: 3838 cells/uL (ref 1500–7800)
Platelets: 219 10*3/uL (ref 140–400)
RBC: 5.09 10*6/uL (ref 4.20–5.80)
RDW: 12 % (ref 11.0–15.0)
Total Lymphocyte: 26 %
WBC: 6.2 10*3/uL (ref 3.8–10.8)
WBCMIX: 614 {cells}/uL (ref 200–950)

## 2018-02-14 LAB — TSH: TSH: 1.01 m[IU]/L (ref 0.40–4.50)

## 2018-02-15 NOTE — Assessment & Plan Note (Addendum)
Possible syncope.  He is not certain if he passed out or not.  He had a history of lightheadedness but that had previously gotten better several months ago.  He had some vertigo on Jardiance but he had stopped that medication.  Unclear if the gabapentin was making him vertiginous.  Reasonable to taper gabapentin in the meantime but not stop it cold [place].  His tremor may get slightly worse in the meantime.  Discussed with patient.  Check routine labs today.  See notes on labs.  Check routine labs today.  See notes on labs.  The concern is if he had some type of dysrhythmia that caused him to pass out without any antecedent symptoms or warning such as lightheadedness.  I want him to see cardiology about this and we will put in the referral.  He has no new focal neurologic symptoms.  He has his tremor at baseline.

## 2018-02-15 NOTE — Assessment & Plan Note (Signed)
Refer to endocrinology.  I appreciate the help of all involved.

## 2018-02-16 ENCOUNTER — Ambulatory Visit: Payer: Medicare HMO | Admitting: Family Medicine

## 2018-02-16 NOTE — Telephone Encounter (Signed)
Electronic refill request Last office visit 02/13/18 Last refill 01/09/17 #135/3

## 2018-02-17 NOTE — Telephone Encounter (Signed)
Sent. Thanks.   

## 2018-03-04 DIAGNOSIS — M170 Bilateral primary osteoarthritis of knee: Secondary | ICD-10-CM | POA: Diagnosis not present

## 2018-03-04 DIAGNOSIS — M1711 Unilateral primary osteoarthritis, right knee: Secondary | ICD-10-CM | POA: Diagnosis not present

## 2018-03-09 ENCOUNTER — Ambulatory Visit: Payer: Medicare HMO | Admitting: Internal Medicine

## 2018-03-24 ENCOUNTER — Encounter: Payer: Self-pay | Admitting: Family Medicine

## 2018-03-25 ENCOUNTER — Other Ambulatory Visit: Payer: Self-pay

## 2018-03-25 MED ORDER — PEN NEEDLES 32G X 4 MM MISC: 1.0000 "application " | Freq: Every day | 3 refills | Status: DC

## 2018-04-02 DIAGNOSIS — D045 Carcinoma in situ of skin of trunk: Secondary | ICD-10-CM | POA: Diagnosis not present

## 2018-04-02 DIAGNOSIS — D485 Neoplasm of uncertain behavior of skin: Secondary | ICD-10-CM | POA: Diagnosis not present

## 2018-04-02 DIAGNOSIS — X32XXXA Exposure to sunlight, initial encounter: Secondary | ICD-10-CM | POA: Diagnosis not present

## 2018-04-02 DIAGNOSIS — L570 Actinic keratosis: Secondary | ICD-10-CM | POA: Diagnosis not present

## 2018-04-06 ENCOUNTER — Other Ambulatory Visit: Payer: Self-pay | Admitting: Family Medicine

## 2018-04-06 NOTE — Telephone Encounter (Signed)
Name of Medication: Oxycodone Name of Pharmacy: [facility] or Written Date and Quantity:  60 tablet 0 01/21/2018  Last Office Visit and Type: 02/13/18 Next Office Visit and Type: None Last Controlled Substance Agreement Date: 02/28/15 Last UDS: 03/14/16

## 2018-04-07 MED ORDER — OXYCODONE-ACETAMINOPHEN 5-325 MG PO TABS: 1.0000 | ORAL_TABLET | Freq: Four times a day (QID) | ORAL | 0 refills | Status: DC | PRN

## 2018-04-07 NOTE — Telephone Encounter (Signed)
Sent. Thanks.   

## 2018-04-09 ENCOUNTER — Ambulatory Visit (INDEPENDENT_AMBULATORY_CARE_PROVIDER_SITE_OTHER): Payer: Medicare HMO

## 2018-04-09 ENCOUNTER — Encounter: Payer: Self-pay | Admitting: Internal Medicine

## 2018-04-09 ENCOUNTER — Ambulatory Visit: Payer: Medicare HMO | Admitting: Internal Medicine

## 2018-04-09 VITALS — BP 152/87 | HR 86 | Ht 68.5 in | Wt 191.5 lb

## 2018-04-09 DIAGNOSIS — I10 Essential (primary) hypertension: Secondary | ICD-10-CM

## 2018-04-09 DIAGNOSIS — R55 Syncope and collapse: Secondary | ICD-10-CM

## 2018-04-09 NOTE — Patient Instructions (Signed)
Medication Instructions:  Your physician recommends that you continue on your current medications as directed. Please refer to the Current Medication list given to you today.   Labwork: none  Testing/Procedures: Your physician has requested that you have an echocardiogram. Echocardiography is a painless test that uses sound waves to create images of your heart. It provides your doctor with information about the size and shape of your heart and how well your heart's chambers and valves are working. This procedure takes approximately one hour. There are no restrictions for this procedure. You may get an IV, if needed, to receive an ultrasound enhancing agent through to better visualize your heart.    Your physician has recommended that you wear an 14 DAY ZIO event monitor. Event monitors are medical devices that record the heart's electrical activity. Doctors most often [place] these monitors to diagnose arrhythmias. Arrhythmias are problems with the speed or rhythm of the heartbeat. The monitor is a small, portable device. You can wear one while you do your normal daily activities. This is usually used to diagnose what is causing palpitations/syncope (passing out). A Zio Patch Event Heart monitor will be applied to your chest today.  You will wear the patch for 14 days. After 24 hours, you may shower with the heart monitor on. If you feel any SYMPTOMS, you may press and release the button in the middle of the monitor.    Follow-Up: Your physician recommends that you schedule a follow-up appointment in: [place].   Echocardiogram An echocardiogram, or echocardiography, uses sound waves (ultrasound) to produce an image of your heart. The echocardiogram is simple, painless, obtained within a short period of time, and offers valuable information to your health care provider. The images from an echocardiogram can provide information such as:  Evidence of coronary artery disease  (CAD).  Heart size.  Heart muscle function.  Heart valve function.  Aneurysm detection.  Evidence of a past heart attack.  Fluid buildup around the heart.  Heart muscle thickening.  Assess heart valve function.  Tell a health care provider about:  Any allergies you have.  All medicines you are taking, including vitamins, herbs, eye drops, creams, and over-the-counter medicines.  Any problems you or family members have had with anesthetic medicines.  Any blood disorders you have.  Any surgeries you have had.  Any medical conditions you have.  Whether you are pregnant or may be pregnant. What happens before the procedure? No special preparation is needed. Eat and drink normally. What happens during the procedure?  In order to produce an image of your heart, gel will be applied to your chest and a wand-like tool (transducer) will be moved over your chest. The gel will help transmit the sound waves from the transducer. The sound waves will harmlessly bounce off your heart to allow the heart images to be captured in real-time motion. These images will then be recorded.  You may need an IV to receive a medicine that improves the quality of the pictures. What happens after the procedure? You may return to your normal schedule including diet, activities, and medicines, unless your health care provider tells you otherwise. This information is not intended to replace advice given to you by your health care provider. Make sure you discuss any questions you have with your health care provider. Document Released: 08/23/2000 Document Revised: 04/13/2016 Document Reviewed: 05/03/2013 Elsevier Interactive Patient Education  2017 [employer].

## 2018-04-09 NOTE — Progress Notes (Signed)
New Outpatient Visit Date: 04/09/2018  Referring Provider: [name], MD [place], [place] [id number]  Chief Complaint: Dizziness  HPI:  [name] is a 72 y.o. male who is being seen today for the evaluation of questionable syncope at the request of Dr. [name]. He has a history of stroke, hypertnesion, hyperlipidemia, type 2 diabetes mellitus, essential tremor, and osteoarthritis. He was evaluated for syncope in 2016 by Dr. Fletcher Anon.  Episode was felt to be most consistent with vasovagal etiology.  Echo was recommended but was never performed.  Today, Mr. Nesheiwat reports a long history of dizziness that he calls "vertigo."  Episodes happen sporadically without warning and typically involve lightheadedness and blurring of his vision as well as the sensation that things are spinning around him.  He oftentimes will need to lie down and allow the symptoms to pass over the course of several minutes.  He has fallen and briefly blacked out, most recently a few weeks ago.  He fell into a ditch but did not injure himself.  There are no clear precipitants.  He denies chest pain, shortness of breath, palpitations, orthopnea, PND, and edema.  Mr. Steers has never checked his blood sugar during 1 of these episodes.  He titrates his diabetes medications based on symptoms.  For instance, he sometimes develops nausea due to metformin and will hold his metformin for several days and instead take glipizide.  He also titrates his dose of insulin based on blood sugars.  Mr. Mallek reports having undergone cardiac work-up by Dr. Tami Ribas in [place] many years ago.  Records are not available, though the patient states he underwent cardiac catheterization and echocardiogram.  --------------------------------------------------------------------------------------------------  Cardiovascular History & Procedures: Cardiovascular Problems:  Syncope  Risk Factors:  Hypertension, hyperlipidemia,  diabetes mellitus, male gender, and age > 35  Cath/PCI:  None available  CV Surgery:  None  EP Procedures and Devices:  None  Non-Invasive Evaluation(s):  None available  Recent CV Pertinent Labs: Lab Results  Component Value Date   CHOL 206 (H) 08/05/2017   HDL 52.50 08/05/2017   LDLCALC 129 (H) 08/05/2017   LDLDIRECT 160.2 06/01/2012   TRIG 123.0 08/05/2017   CHOLHDL 4 08/05/2017   K 4.3 02/13/2018   K 4.0 09/13/2013   BUN 19 02/13/2018   BUN 18 09/13/2013   CREATININE 1.20 (H) 02/13/2018    --------------------------------------------------------------------------------------------------  Past Medical History:  Diagnosis Date  . Basal cell carcinoma    multiple  . Cataracts, both eyes   . Complication of anesthesia    post-operative cognitive dysfunction after ACDF on 05/01/11 [facility])  . Deaf    right ear  . Diabetes mellitus   . DJD (degenerative joint disease)    lumbar spine  . Hearing impaired    hearing aids-transmitter rt   . History of blood transfusion    as a baby in 9  . History of colonic polyps   . Hyperlipidemia   . Hypertension    pt. denies high blood pressure  . Kidney stone 1967  . Memory change 12/17/2017  . Nose fracture    3x  . OCD (obsessive compulsive disorder)   . Stroke [facility])    "mini stroke, that's what gave me the tremors"  . Stuttering   . Tremor, essential   . Wears glasses     Past Surgical History:  Procedure Laterality Date  . APPENDECTOMY  1960  . BACK SURGERY  2012   lumbar surgery  .  CERVICAL FUSION  1990  . CERVICAL FUSION  8/12   multiple levels with plates  . CHOLESTEATOMA EXCISION  1993  . COLLATERAL LIGAMENT REPAIR, ELBOW     bilateral, nerve improvement left 08/04, right 09/04  . COLONOSCOPY    . CYST EXCISION     back of cervical spine total of 5 surgeries  . EXTERNAL EAR SURGERY     multiple ear surguries as a child  . OTHER SURGICAL HISTORY     benign head tumor#5  . OTHER SURGICAL  HISTORY     sebaceous cysts-post neck x5  . SHOULDER ARTHROSCOPY W/ ACROMIAL REPAIR  08/2004   left shoulder  . TONSILLECTOMY    . ULNAR NERVE TRANSPOSITION Right 05/13/2013   Procedure: RIGHT ULNAR NERVE DECOMPRESSION/TRANSPOSITION;  Surgeon: [name]., MD;  Location: [facility];  Service: Orthopedics;  Laterality: Right;    Current Meds  Medication Sig  . ALPRAZolam (XANAX) 0.5 MG tablet TAKE 1/2 TO 1 TABLET BY MOUTH TWICE DAILY AS NEEDED FOR ANXIETY  . canagliflozin (INVOKANA) 300 MG TABS tablet Take 1 tablet (300 mg total) by mouth daily before breakfast.  . FLUoxetine (PROZAC) 10 MG capsule Take 1 capsule (10 mg total) by mouth daily.  . fluticasone (FLONASE) 50 MCG/ACT nasal spray PLACE 1-2 SPRAYS INTO BOTH NOSTRILS DAILY  . gabapentin (NEURONTIN) 100 MG capsule Take 1-2 capsules (100-200 mg total) by mouth 3 (three) times daily as needed.  Marland Kitchen glipiZIDE (GLUCOTROL XL) 2.5 MG 24 hr tablet Take 3 tablets (7.5 mg total) by mouth daily before breakfast.  . Insulin Glargine (LANTUS SOLOSTAR) 100 UNIT/ML Solostar Pen Inject under skin up to 50 UNITS DAILY AS DIRECTED  . Insulin Pen Needle (PEN NEEDLES) 32G X 4 MM MISC Inject 1 application as directed daily.  . Multiple Vitamins-Minerals (CENTRUM SILVER 50+MEN PO) Take 1 tablet by mouth daily.  Marland Kitchen oxyCODONE-acetaminophen (PERCOCET/ROXICET) 5-325 MG tablet Take 1 tablet by mouth every 6 (six) hours as needed (pain).  . propranolol (INDERAL) 10 MG tablet Take 10 mg by mouth daily.  . propranolol ER (INDERAL LA) 60 MG 24 hr capsule Take 1 capsule (60 mg total) by mouth daily.  . traZODone (DESYREL) 50 MG tablet TAKE 1 AND 1/2 TABLETS BY MOUTH AT BEDTIME AS NEEEDED FOR SLEEP    Allergies: Bee venom; Pravastatin; Ace inhibitors; Actos [pioglitazone]; Angiotensin receptor blockers; Aripiprazole; Crestor [rosuvastatin calcium]; Losartan; Metformin and related; Olanzapine; Other; Pneumovax 23 [pneumococcal vac polyvalent];  Primidone; Rosuvastatin calcium; Codeine; Codeine phosphate; Divalproex sodium; Divalproex sodium; Lithium; Lithium carbonate; Metformin; and Sulfamethoxazole-trimethoprim  Social History   Tobacco Use  . Smoking status: Former Smoker    Packs/day: 0.50    Years: 40.00    Pack years: 20.00    Types: Pipe    Last attempt to quit: 06/09/2016    Years since quitting: 1.8  . Smokeless tobacco: Never Used  Substance Use Topics  . Alcohol use: No    Alcohol/week: 0.0 oz  . Drug use: No    Family History  Problem Relation Age of Onset  . OCD Mother   . Bipolar disorder Mother   . Cancer Mother   . 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 Arthritis Sister   . OCD Sister   . Colon cancer Neg Hx   . Prostate cancer Neg Hx     Review of Systems: A 12-system review of systems was performed and was  negative except as noted in the HPI.  --------------------------------------------------------------------------------------------------  Physical Exam: BP (!) 152/87 (BP Location: Right Arm, Patient Position: Sitting, Cuff Size: Normal)   Pulse 86   Ht 5' 8.5" (1.74 m)   Wt 191 lb 8 oz (86.9 kg)   BMI 28.69 kg/m   General: NAD. HEENT: No conjunctival pallor or scleral icterus. Moist mucous membranes. OP clear. Neck: Supple without lymphadenopathy, thyromegaly, JVD, or HJR. No carotid bruit. Lungs: Normal work of breathing. Clear to auscultation bilaterally without wheezes or crackles. Heart: Regular rate and rhythm without murmurs, rubs, or gallops. Non-displaced PMI. Abd: Bowel sounds present. Soft, NT/ND without hepatosplenomegaly Ext: No lower extremity edema. Radial, PT, and DP pulses are 2+ bilaterally Skin: Warm and dry without rash. Neuro: CNIII-XII intact. Strength and fine-touch sensation intact in upper and lower extremities bilaterally. Psych: Normal mood and affect.  EKG: Normal sinus rhythm with inferior Q waves.  Otherwise, no significant  abnormality.  No significant change since 08/29/2015.  Lab Results  Component Value Date   WBC 6.2 02/13/2018   HGB 15.4 02/13/2018   HCT 44.9 02/13/2018   MCV 88.2 02/13/2018   PLT 219 02/13/2018    Lab Results  Component Value Date   NA 139 02/13/2018   K 4.3 02/13/2018   CL 103 02/13/2018   CO2 24 02/13/2018   BUN 19 02/13/2018   CREATININE 1.20 (H) 02/13/2018   GLUCOSE 237 (H) 02/13/2018   ALT 19 02/13/2018    Lab Results  Component Value Date   CHOL 206 (H) 08/05/2017   HDL 52.50 08/05/2017   LDLCALC 129 (H) 08/05/2017   LDLDIRECT 160.2 06/01/2012   TRIG 123.0 08/05/2017   CHOLHDL 4 08/05/2017     --------------------------------------------------------------------------------------------------  ASSESSMENT AND PLAN: Syncope and lightheadedness To some degree, Mr. Burnham symptoms are reminiscent of vasovagal syncope.  He demonstrates orthostatic drop in blood pressure today.  He certainly could have some element of autonomic dysfunction, and I cannot exclude an underlying neurologic cause that may be contributing to this.  I think it would be worthwhile for him to continue to follow with neurology.  Transient arrhythmia is also a consideration.  We will obtain a transthoracic echocardiogram to exclude structural abnormalities as well as a 14-day event monitor.  Given his multiple cardiac risk factors and inferior Q waves on EKG, ischemia evaluation male need to be considered in the future as well despite lack of angina.  I have advised Mr. Wissner to avoid driving for 6 months following his most recent syncopal episode.  We will not make any medication changes today; I will allow a degree of permissive hypertension given orthostatic hypotension and aforementioned symptoms.  I have recommended that he consider wearing compression stockings as well as an abdominal binder.  He should stay well-hydrated and minimize caffeine/alcohol consumption.  If symptoms persist, we could try  decreasing propranolol, which Mr. Bayron is using for essential tremor.  Hypertension Blood pressure mildly elevated.  However, in the setting of orthostatic blood pressure drop and intermittent lightheadedness, I will defer medication changes at this time.  Follow-up: Return to clinic in 6 weeks.  Nelva Bush, MD 04/09/2018 10:33 PM

## 2018-04-14 ENCOUNTER — Other Ambulatory Visit: Payer: Self-pay

## 2018-04-14 ENCOUNTER — Ambulatory Visit (INDEPENDENT_AMBULATORY_CARE_PROVIDER_SITE_OTHER): Payer: Medicare HMO

## 2018-04-14 DIAGNOSIS — R55 Syncope and collapse: Secondary | ICD-10-CM | POA: Diagnosis not present

## 2018-04-15 DIAGNOSIS — R55 Syncope and collapse: Secondary | ICD-10-CM | POA: Diagnosis not present

## 2018-04-22 ENCOUNTER — Other Ambulatory Visit: Payer: Self-pay | Admitting: Family Medicine

## 2018-04-22 DIAGNOSIS — R55 Syncope and collapse: Secondary | ICD-10-CM | POA: Diagnosis not present

## 2018-04-22 NOTE — Telephone Encounter (Signed)
Sent. Thanks.   

## 2018-04-22 NOTE — Telephone Encounter (Signed)
Name of Medication: Alprazolam Name of Pharmacy: [facility] Last Fill or Written Date and Quantity: 11/07/17 #60/1 Last Office Visit and Type: 02/13/18 Next Office Visit and Type: 10/01/2018 CPE Last Controlled Substance Agreement Date: 02/28/15 Last UDS:03/14/16

## 2018-04-28 ENCOUNTER — Other Ambulatory Visit: Payer: Medicare HMO

## 2018-04-29 DIAGNOSIS — Z794 Long term (current) use of insulin: Secondary | ICD-10-CM | POA: Diagnosis not present

## 2018-04-29 DIAGNOSIS — E1165 Type 2 diabetes mellitus with hyperglycemia: Secondary | ICD-10-CM | POA: Diagnosis not present

## 2018-04-29 DIAGNOSIS — E785 Hyperlipidemia, unspecified: Secondary | ICD-10-CM | POA: Diagnosis not present

## 2018-04-29 DIAGNOSIS — E1169 Type 2 diabetes mellitus with other specified complication: Secondary | ICD-10-CM | POA: Diagnosis not present

## 2018-05-13 DIAGNOSIS — E109 Type 1 diabetes mellitus without complications: Secondary | ICD-10-CM | POA: Diagnosis not present

## 2018-05-13 DIAGNOSIS — H90A21 Sensorineural hearing loss, unilateral, right ear, with restricted hearing on the contralateral side: Secondary | ICD-10-CM | POA: Diagnosis not present

## 2018-05-13 DIAGNOSIS — R42 Dizziness and giddiness: Secondary | ICD-10-CM | POA: Diagnosis not present

## 2018-05-20 ENCOUNTER — Ambulatory Visit: Payer: Medicare HMO | Admitting: Internal Medicine

## 2018-06-11 ENCOUNTER — Encounter: Payer: Self-pay | Admitting: Family Medicine

## 2018-06-11 ENCOUNTER — Other Ambulatory Visit: Payer: Self-pay | Admitting: Family Medicine

## 2018-06-11 DIAGNOSIS — G8929 Other chronic pain: Secondary | ICD-10-CM

## 2018-06-11 MED ORDER — OXYCODONE-ACETAMINOPHEN 5-325 MG PO TABS: 1.0000 | ORAL_TABLET | Freq: Four times a day (QID) | ORAL | 0 refills | Status: DC | PRN

## 2018-06-11 NOTE — Telephone Encounter (Signed)
Name of Medication: oxycodone apap 5-325 mg Name of Pharmacy: [facility] or Written Date and Quantity: # 41 on 04/07/18 Last Office Visit and Type: 02/13/18 acute Next Office Visit and Type: 10/01/18 CPX Last Controlled Substance Agreement Date: none Last UDS:03/14/2016

## 2018-06-11 NOTE — Telephone Encounter (Signed)
Sent. Thanks.  [place] reviewed, okay.

## 2018-06-13 ENCOUNTER — Other Ambulatory Visit: Payer: Self-pay | Admitting: Family Medicine

## 2018-06-15 NOTE — Telephone Encounter (Signed)
Name of Medication: Alprazolam  Name of Pharmacy: [facility] Last Fill or Written Date and Quantity: 04/22/18 #60/1 Last Office Visit and Type: 02/13/18 Next Office Visit and Type: 09/28/18 [facility] Last Controlled Substance Agreement Date: 02/28/15 Last UDS: 09/28/18

## 2018-06-16 NOTE — Telephone Encounter (Signed)
Noted. Thanks.

## 2018-06-16 NOTE — Telephone Encounter (Signed)
Saw Dr. Honor Junes already, next appointment scheduled 08/05/2018.

## 2018-06-16 NOTE — Telephone Encounter (Signed)
Sent. Thanks.  When is he going to f/u with endo?

## 2018-06-25 ENCOUNTER — Ambulatory Visit: Payer: Self-pay | Admitting: Neurology

## 2018-07-02 ENCOUNTER — Other Ambulatory Visit: Payer: Self-pay | Admitting: Internal Medicine

## 2018-07-07 DIAGNOSIS — E119 Type 2 diabetes mellitus without complications: Secondary | ICD-10-CM | POA: Diagnosis not present

## 2018-07-07 LAB — HM DIABETES EYE EXAM

## 2018-07-09 ENCOUNTER — Encounter: Payer: Self-pay | Admitting: Family Medicine

## 2018-07-20 ENCOUNTER — Other Ambulatory Visit: Payer: Self-pay | Admitting: Family Medicine

## 2018-07-20 NOTE — Telephone Encounter (Signed)
Electronic refill request. Alprazolam Last office visit:   02/13/18 DM Last Filled:    60 tablet 0 06/16/2018  Please advise.

## 2018-07-29 DIAGNOSIS — R208 Other disturbances of skin sensation: Secondary | ICD-10-CM | POA: Diagnosis not present

## 2018-07-29 DIAGNOSIS — L820 Inflamed seborrheic keratosis: Secondary | ICD-10-CM | POA: Diagnosis not present

## 2018-08-04 ENCOUNTER — Encounter: Payer: Self-pay | Admitting: Family Medicine

## 2018-08-05 DIAGNOSIS — E1165 Type 2 diabetes mellitus with hyperglycemia: Secondary | ICD-10-CM | POA: Diagnosis not present

## 2018-08-05 DIAGNOSIS — E785 Hyperlipidemia, unspecified: Secondary | ICD-10-CM | POA: Diagnosis not present

## 2018-08-05 DIAGNOSIS — E1169 Type 2 diabetes mellitus with other specified complication: Secondary | ICD-10-CM | POA: Diagnosis not present

## 2018-08-05 DIAGNOSIS — Z794 Long term (current) use of insulin: Secondary | ICD-10-CM | POA: Diagnosis not present

## 2018-08-12 ENCOUNTER — Other Ambulatory Visit: Payer: Self-pay | Admitting: Family Medicine

## 2018-08-12 DIAGNOSIS — S60211A Contusion of right wrist, initial encounter: Secondary | ICD-10-CM | POA: Diagnosis not present

## 2018-08-12 DIAGNOSIS — S5002XA Contusion of left elbow, initial encounter: Secondary | ICD-10-CM | POA: Diagnosis not present

## 2018-08-13 NOTE — Telephone Encounter (Signed)
Name of Medication: Oxycodone Name of Pharmacy: [facility] or Written Date and Quantity:  60 tablet 0 06/11/2018  Last Office Visit and Type: 02/13/18 DM Next Office Visit and Type: 08/28/18 AWV Last Controlled Substance Agreement Date:  02/28/15 Last UDS: 03/14/16

## 2018-08-14 ENCOUNTER — Ambulatory Visit: Payer: Medicare HMO | Admitting: Family Medicine

## 2018-08-14 MED ORDER — OXYCODONE-ACETAMINOPHEN 5-325 MG PO TABS: 1.0000 | ORAL_TABLET | Freq: Four times a day (QID) | ORAL | 0 refills | Status: DC | PRN

## 2018-08-14 NOTE — Telephone Encounter (Signed)
Sent. Thanks.   

## 2018-08-14 NOTE — Telephone Encounter (Signed)
Pt said has been out of oxycodone for 3 days and pt is really hurting and would appreciate refill done today.

## 2018-08-25 DIAGNOSIS — Z6828 Body mass index (BMI) 28.0-28.9, adult: Secondary | ICD-10-CM | POA: Diagnosis not present

## 2018-08-25 DIAGNOSIS — M5440 Lumbago with sciatica, unspecified side: Secondary | ICD-10-CM | POA: Diagnosis not present

## 2018-08-25 DIAGNOSIS — R030 Elevated blood-pressure reading, without diagnosis of hypertension: Secondary | ICD-10-CM | POA: Diagnosis not present

## 2018-08-26 ENCOUNTER — Encounter: Payer: Self-pay | Admitting: Family Medicine

## 2018-08-26 DIAGNOSIS — E119 Type 2 diabetes mellitus without complications: Secondary | ICD-10-CM | POA: Diagnosis not present

## 2018-08-27 ENCOUNTER — Encounter: Payer: Self-pay | Admitting: Family Medicine

## 2018-08-28 ENCOUNTER — Encounter: Payer: Self-pay | Admitting: Family Medicine

## 2018-08-28 LAB — LAB REPORT - SCANNED: A1c: 8.6

## 2018-08-31 ENCOUNTER — Encounter: Payer: Self-pay | Admitting: Family Medicine

## 2018-09-08 ENCOUNTER — Other Ambulatory Visit: Payer: Self-pay | Admitting: Neurosurgery

## 2018-09-08 ENCOUNTER — Other Ambulatory Visit (HOSPITAL_COMMUNITY): Payer: Self-pay | Admitting: Neurosurgery

## 2018-09-08 DIAGNOSIS — M5440 Lumbago with sciatica, unspecified side: Secondary | ICD-10-CM

## 2018-09-15 ENCOUNTER — Encounter: Payer: Self-pay | Admitting: Family Medicine

## 2018-09-17 ENCOUNTER — Encounter: Payer: Self-pay | Admitting: Family Medicine

## 2018-09-17 ENCOUNTER — Telehealth: Payer: Self-pay | Admitting: Family Medicine

## 2018-09-17 NOTE — Telephone Encounter (Signed)
Please call concerning this pt. Message is very detailed.

## 2018-09-18 ENCOUNTER — Telehealth: Payer: Self-pay | Admitting: Family Medicine

## 2018-09-18 NOTE — Telephone Encounter (Signed)
Pt returning call from nurse

## 2018-09-18 NOTE — Telephone Encounter (Signed)
I left a message for the pt to return a call to the office.

## 2018-09-18 NOTE — Telephone Encounter (Signed)
Patient has an appointment with Dr. [name] to review his medication concerns on Monday 09/21/18.   I reviewed his entire medication list with him and his medication request needs:  1.   Dr. Saintclair Halsted has started him on Pregabalin 75mg  1 pill twice daily and has requested that Dr. [name] manage any future refills.  Patient is frustrated that we have not received any faxed notes from Dr. Windy Carina office, but does not feel that we are to blame for this. He has given Dr. Windy Carina office our fax number to ensure it is correct.  2.  He would like to discuss a change in his oxycodone/acetaminophen rx from 5/325 1 q 6 hours prn pain, #60 (which he says for him, in order to last, he can only take 1 or 2 a day which does not help).    He would therefore like to possibly change to a 10/325 1 po daily as he feels this would give him better pain control.  3.  He is taking gabapentin but can only tolerate 100mg  1 po bid due to side effects.   Note: patient will need refills on oxycodone-acetaminophen rx and pregabalin if he is to continue.    He is very fearful of being labeled an "addict" and feels like "they" have lumped him into that same group as others.  He hopes Dr. [name] understands his struggles with pain and does not consider him thus.  I explained that we would never label him in such a way and Dr. [name] will review all of his concerns around medications when he sees him Monday.  Patient thanked me for the call and discussion and looks forward to his appointment on Monday.   Thanks.

## 2018-09-18 NOTE — Telephone Encounter (Signed)
Please review detailed phone encounter on patient from today 09/18/18 in separate phone string which addresses patients questions/concerns.

## 2018-09-18 NOTE — Telephone Encounter (Addendum)
See mychart message.  The issue is his pain medication prescription.  He has seen Dr. Windy Carina office.  Please call patient and verify his current medication and refill need.  Let me know.   See if he is still taking gabapentin also.  Thanks.

## 2018-09-19 ENCOUNTER — Ambulatory Visit: Payer: Medicare HMO

## 2018-09-19 MED ORDER — GABAPENTIN 100 MG PO CAPS: 100.0000 mg | ORAL_CAPSULE | Freq: Every day | ORAL | Status: DC

## 2018-09-19 NOTE — Telephone Encounter (Signed)
Noted.  Thanks.  I do not think the patient is an addict.  I will work to try to treat his pain.  I want to make sure that he has appropriate and effective medications, as I would want for any patient.  We will talk about all of this when he comes in.  I appreciate the help of all involved.

## 2018-09-20 ENCOUNTER — Ambulatory Visit: Payer: PPO

## 2018-09-21 ENCOUNTER — Encounter: Payer: Self-pay | Admitting: Family Medicine

## 2018-09-21 ENCOUNTER — Other Ambulatory Visit: Payer: Self-pay | Admitting: *Deleted

## 2018-09-21 ENCOUNTER — Ambulatory Visit (INDEPENDENT_AMBULATORY_CARE_PROVIDER_SITE_OTHER): Payer: PPO | Admitting: Family Medicine

## 2018-09-21 DIAGNOSIS — M549 Dorsalgia, unspecified: Secondary | ICD-10-CM | POA: Diagnosis not present

## 2018-09-21 DIAGNOSIS — G8929 Other chronic pain: Secondary | ICD-10-CM

## 2018-09-21 MED ORDER — PREGABALIN 75 MG PO CAPS: 75.0000 mg | ORAL_CAPSULE | Freq: Two times a day (BID) | ORAL | 2 refills | Status: DC

## 2018-09-21 MED ORDER — OXYCODONE-ACETAMINOPHEN 5-325 MG PO TABS: 1.0000 | ORAL_TABLET | Freq: Two times a day (BID) | ORAL | 0 refills | Status: DC | PRN

## 2018-09-21 NOTE — Progress Notes (Signed)
Prev MRI with   Multilevel spondylosis as described. Potentially symptomatic neural impingement at L1-2, L2-3, L3-4, L4-5, and L5-S1.   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 Disc space narrowing with bilateral foraminal narrowing is the dominant feature at this level.  ============================== Prev CT with   multilevel degenerative disc disease, spondylosis and facet arthropathy within the lumbar spine identified. ==============================  He clearly has longstanding back pain and clear pathology.  D/w pt.  He is trying to avoid surgery and wants to have enough pain control to be able to get out in the yard.    At the time of the office visit I don't yet have notes from Dr. Windy Carina office, d/w pt.    He is on lyrica 75mg  BID.  D/w pt about stopping gabapentin.  lyrica seems to help.  No ADE on med other than some GI upset.  The posterior neck pain is better.  His back pain is more tolerable on med.  He still has stabbing pain down B legs, at baseline. He still has B lower back pain, at baseline.    We talked about routine cautions on meds, with BZD/opiates, etc.   [place] database reviewed, appropriate.    Oxycodone only helps for about 2 hours, at best.    Indication for chronic opioid: Chronic neck and back pain Medication and dose: oxycodone 5/325mg  # pills per month: #120 per month Last UDS date: 03/14/16 Pain contract signed (Y/N): yes Date narcotic database last reviewed (include red flags): 09/21/18  PMH and SH reviewed  ROS: Per HPI unless specifically indicated in ROS section   Meds, vitals, and allergies reviewed.   GEN: nad, alert and oriented, not sedated.  Affect at baseline.  Speech and judgment appear intact. HEENT: mucous membranes moist NECK: supple w/o LA CV: rrr. PULM: ctab, no inc wob ABD: soft, +bs EXT: no edema SKIN: no acute rash Tremor noted at baseline.

## 2018-09-21 NOTE — Patient Instructions (Signed)
Continue lyrica 75mg  twice a day.  You can take up to 4 oxycodone a day, but I wouldn't take more than 2 at a time.  Reasonable to try going up to 1.5 tabs at a time initially.  Use it as needed.  Stop gabapentin.  Update me as needed.  Sedation caution on meds.  Take care.  Glad to see you.

## 2018-09-23 ENCOUNTER — Encounter: Payer: Self-pay | Admitting: Family Medicine

## 2018-09-23 NOTE — Assessment & Plan Note (Addendum)
Indication for chronic opioid: Chronic neck and back pain Medication and dose: oxycodone 5/325mg  # pills per month: #120 per month Last UDS date: 03/14/16 Pain contract signed (Y/N): yes Date narcotic database last reviewed (include red flags): 09/21/18  We had a long discussion about his situation.  He was worried about being labeled "a drug addict."  I do not think he is a drug addict.  I think he has a really bad back with documented pathology and significant pain.  He is trying to get by, to be as functional as possible and avoid any extra procedures.  His oxycodone dose at 5/325 taken as needed is not controlling his pain.  He did not tolerate escalating doses of gabapentin so it makes sense to stop that.  He is taking Lyrica 75 mg twice a day and it makes sense to continue as is for now.  It likely makes sense to increase his oxycodone slightly.  He can take 1.5-2 tablets of oxycodone 5/325 (up to 4 pills a day) and he will see how that does for his pain.  He will update me as needed.  Routine sedation caution given.  He agrees with plan.  I answered all questions to the best my ability.  I thank all involved. >25 minutes spent in face to face time with patient, >50% spent in counselling or coordination of care.

## 2018-09-24 ENCOUNTER — Encounter: Payer: Self-pay | Admitting: Family Medicine

## 2018-09-25 ENCOUNTER — Other Ambulatory Visit: Payer: Self-pay | Admitting: Family Medicine

## 2018-09-25 DIAGNOSIS — M79643 Pain in unspecified hand: Secondary | ICD-10-CM

## 2018-09-28 ENCOUNTER — Encounter: Payer: PPO | Admitting: Family Medicine

## 2018-09-28 ENCOUNTER — Ambulatory Visit (INDEPENDENT_AMBULATORY_CARE_PROVIDER_SITE_OTHER): Payer: PPO

## 2018-09-28 ENCOUNTER — Ambulatory Visit: Payer: PPO

## 2018-09-28 ENCOUNTER — Other Ambulatory Visit: Payer: Self-pay | Admitting: Family Medicine

## 2018-09-28 VITALS — BP 160/92 | HR 78 | Temp 97.4°F | Ht 69.0 in | Wt 197.2 lb

## 2018-09-28 DIAGNOSIS — Z0000 Encounter for general adult medical examination without abnormal findings: Secondary | ICD-10-CM | POA: Diagnosis not present

## 2018-09-28 DIAGNOSIS — IMO0002 Reserved for concepts with insufficient information to code with codable children: Secondary | ICD-10-CM

## 2018-09-28 DIAGNOSIS — E1165 Type 2 diabetes mellitus with hyperglycemia: Secondary | ICD-10-CM

## 2018-09-28 DIAGNOSIS — E1142 Type 2 diabetes mellitus with diabetic polyneuropathy: Secondary | ICD-10-CM | POA: Diagnosis not present

## 2018-09-28 LAB — LIPID PANEL
Cholesterol: 181 mg/dL (ref 0–200)
HDL: 37.8 mg/dL — ABNORMAL LOW (ref >39.00)
LDL Cholesterol: 111 mg/dL — ABNORMAL HIGH (ref 0–99)
NonHDL: 143.14
Total CHOL/HDL Ratio: 5
Triglycerides: 160 mg/dL — ABNORMAL HIGH (ref 0.0–149.0)
VLDL: 32 mg/dL (ref 0.0–40.0)

## 2018-09-28 LAB — CBC WITH DIFFERENTIAL/PLATELET
BASOS PCT: 0.9 % (ref 0.0–3.0)
Basophils Absolute: 0.1 10*3/uL (ref 0.0–0.1)
EOS ABS: 0.1 10*3/uL (ref 0.0–0.7)
Eosinophils Relative: 2.5 % (ref 0.0–5.0)
HCT: 44.1 % (ref 39.0–52.0)
Hemoglobin: 14.7 g/dL (ref 13.0–17.0)
Lymphocytes Relative: 22.7 % (ref 12.0–46.0)
Lymphs Abs: 1.3 10*3/uL (ref 0.7–4.0)
MCHC: 33.4 g/dL (ref 30.0–36.0)
MCV: 90.5 fl (ref 78.0–100.0)
Monocytes Absolute: 0.5 10*3/uL (ref 0.1–1.0)
Monocytes Relative: 9.1 % (ref 3.0–12.0)
Neutro Abs: 3.8 10*3/uL (ref 1.4–7.7)
Neutrophils Relative %: 64.8 % (ref 43.0–77.0)
PLATELETS: 196 10*3/uL (ref 150.0–400.0)
RBC: 4.87 Mil/uL (ref 4.22–5.81)
RDW: 13.8 % (ref 11.5–15.5)
WBC: 5.9 10*3/uL (ref 4.0–10.5)

## 2018-09-28 LAB — COMPREHENSIVE METABOLIC PANEL
ALT: 18 U/L (ref 0–53)
AST: 18 U/L (ref 0–37)
Albumin: 4.2 g/dL (ref 3.5–5.2)
Alkaline Phosphatase: 57 U/L (ref 39–117)
BUN: 12 mg/dL (ref 6–23)
CO2: 28 mEq/L (ref 19–32)
Calcium: 9.1 mg/dL (ref 8.4–10.5)
Chloride: 103 mEq/L (ref 96–112)
Creatinine, Ser: 1.13 mg/dL (ref 0.40–1.50)
GFR: 63.82 mL/min (ref >60.00)
Glucose, Bld: 155 mg/dL — ABNORMAL HIGH (ref 70–99)
Potassium: 4.6 mEq/L (ref 3.5–5.1)
Sodium: 141 mEq/L (ref 135–145)
Total Bilirubin: 0.7 mg/dL (ref 0.2–1.2)
Total Protein: 6.8 g/dL (ref 6.0–8.3)

## 2018-09-28 LAB — TSH: TSH: 1.59 u[IU]/mL (ref 0.35–4.50)

## 2018-09-28 NOTE — Progress Notes (Signed)
Subjective:   Walter Harris is a 73 y.o. male who presents for Medicare Annual (Subsequent) preventive examination.  Review of Systems:  N/A Cardiac Risk Factors include: advanced age (>78men, >16 women);diabetes mellitus;dyslipidemia;hypertension;male gender     Objective:     Vitals: BP (!) 160/92 (BP Location: Left Arm, Patient Position: Sitting, Cuff Size: Normal)   Pulse 78   Temp (!) 97.4 F (36.3 C) (Oral)   Ht 5\' 9"  (1.753 m) Comment: shoes  Wt 197 lb 4 oz (89.5 kg)   SpO2 97%   BMI 29.13 kg/m   Body mass index is 29.13 kg/m.  Advanced Directives 09/28/2018 09/25/2017 03/14/2016 09/27/2014 02/16/2014 05/12/2013  Does Patient Have a Medical Advance Directive? Yes Yes Yes Yes Patient has advance directive, copy not in chart Patient does not have advance directive  Type of Advance Directive [place];Living will [place];Living will [place];Living will [place];Living will Living will -  Does patient want to make changes to medical advance directive? - - No - Patient declined No - Patient declined - -  Copy of [place] in Chart? Yes - validated most recent copy scanned in chart (See row information) No - copy requested No - copy requested No - copy requested Copy requested from family -    Tobacco Social History   Tobacco Use  Smoking Status Former Smoker  . Packs/day: 0.50  . Years: 40.00  . Pack years: 20.00  . Types: Pipe  . Last attempt to quit: 06/09/2016  . Years since quitting: 2.3  Smokeless Tobacco Never Used     Counseling given: No   Clinical Intake:  Pre-visit preparation completed: Yes  Pain Score: 2      Nutritional Status: BMI 25 -29 Overweight Nutritional Risks: None  How often do you need to have someone help you when you read instructions, pamphlets, or other written materials from your doctor or pharmacy?: 1 - Never What is the last grade  level you completed in school?: Bachelor degree  Interpreter Needed?: No  Comments: pt lives with spouse Information entered by :: LPinson, LPN  Past Medical History:  Diagnosis Date  . Basal cell carcinoma    multiple  . Cataracts, both eyes 2019   surgery pending April 2020  . Complication of anesthesia    post-operative cognitive dysfunction after ACDF on 05/01/11 [facility])  . Deaf    right ear  . Diabetes mellitus   . DJD (degenerative joint disease)    lumbar spine  . Hearing impaired    hearing aids-transmitter rt   . History of blood transfusion    as a baby in 14  . History of colonic polyps   . Hyperlipidemia   . Hypertension    pt. denies high blood pressure  . Kidney stone 1967  . Memory change 12/17/2017  . Nose fracture    3x  . OCD (obsessive compulsive disorder)   . Stroke [facility])    "mini stroke, that's what gave me the tremors"  . Stuttering   . Tremor, essential   . Wears glasses    Past Surgical History:  Procedure Laterality Date  . APPENDECTOMY  1960  . BACK SURGERY  2012   lumbar surgery  . CERVICAL FUSION  1990  . CERVICAL FUSION  8/12   multiple levels with plates  . CHOLESTEATOMA EXCISION  1993  . COLLATERAL LIGAMENT REPAIR, ELBOW     bilateral,  nerve improvement left 08/04, right 09/04  . COLONOSCOPY    . CYST EXCISION     back of cervical spine total of 5 surgeries  . EXTERNAL EAR SURGERY     multiple ear surguries as a child  . OTHER SURGICAL HISTORY     benign head tumor#5  . OTHER SURGICAL HISTORY     sebaceous cysts-post neck x5  . SHOULDER ARTHROSCOPY W/ ACROMIAL REPAIR  08/2004   left shoulder  . TONSILLECTOMY    . ULNAR NERVE TRANSPOSITION Right 05/13/2013   Procedure: RIGHT ULNAR NERVE DECOMPRESSION/TRANSPOSITION;  Surgeon: [name]., MD;  Location: [place];  Service: Orthopedics;  Laterality: Right;   Family History  Problem Relation Age of Onset  . OCD Mother   . Bipolar disorder Mother   .  Cancer Mother   . 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 Arthritis Sister   . OCD Sister   . Colon cancer Neg Hx   . Prostate cancer Neg Hx    Social History   Socioeconomic History  . Marital status: Married    Spouse name: Not on file  . Number of children: 1  . Years of education: Not on file  . Highest education level: Not on file  Occupational History  . Occupation: disabled    Employer: RETIRED    CommentDatabase administrator  Social Needs  . Financial resource strain: Not on file  . Food insecurity:    Worry: Not on file    Inability: Not on file  . Transportation needs:    Medical: Not on file    Non-medical: Not on file  Tobacco Use  . Smoking status: Former Smoker    Packs/day: 0.50    Years: 40.00    Pack years: 20.00    Types: Pipe    Last attempt to quit: 06/09/2016    Years since quitting: 2.3  . Smokeless tobacco: Never Used  Substance and Sexual Activity  . Alcohol use: No    Alcohol/week: 0.0 standard drinks  . Drug use: No  . Sexual activity: Never  Lifestyle  . Physical activity:    Days per week: Not on file    Minutes per session: Not on file  . Stress: Not on file  Relationships  . Social connections:    Talks on phone: Not on file    Gets together: Not on file    Attends religious service: Not on file    Active member of club or organization: Not on file    Attends meetings of clubs or organizations: Not on file    Relationship status: Not on file  Other Topics Concern  . Not on file  Social History Narrative   Married, 1992   1 biological child, some contact as of 2017   Wife has 2 kids, no contact as of 2017   Prev worked as Designer, television/film set, Lobbyist, etc   Caffeine use: 1/2 cup every morning- coffee   Right handed     Outpatient Encounter Medications as of 09/28/2018  Medication Sig  . ALPRAZolam (XANAX) 0.5 MG tablet TAKE 1/2-1 TABLET BY MOUTH TWICE DAILY AS NEEDED FOR ANXIETY  . canagliflozin  (INVOKANA) 300 MG TABS tablet Take 1 tablet (300 mg total) by mouth daily before breakfast.  . FLUoxetine (PROZAC) 10 MG capsule Take 1 capsule (10 mg total) by mouth daily.  . fluticasone (FLONASE) 50 MCG/ACT nasal  spray PLACE 1-2 SPRAYS INTO BOTH NOSTRILS DAILY  . glipiZIDE (GLUCOTROL XL) 2.5 MG 24 hr tablet Take 3 tablets (7.5 mg total) by mouth daily before breakfast.  . Insulin Glargine (LANTUS SOLOSTAR) 100 UNIT/ML Solostar Pen Inject under skin up to 50 UNITS DAILY AS DIRECTED  . Insulin Pen Needle (PEN NEEDLES) 32G X 4 MM MISC Inject 1 application as directed daily.  . metFORMIN (GLUCOPHAGE-XR) 500 MG 24 hr tablet TAKE ONE TABLET 3 TIMES DAILY  . Multiple Vitamins-Minerals (CENTRUM SILVER 50+MEN PO) Take 1 tablet by mouth daily.  Marland Kitchen OVER THE COUNTER MEDICATION Hyland's Leg Cramp Pills  . oxyCODONE-acetaminophen (PERCOCET/ROXICET) 5-325 MG tablet Take 1-2 tablets by mouth 2 (two) times daily as needed (pain).  . pregabalin (LYRICA) 75 MG capsule Take 1 capsule (75 mg total) by mouth 2 (two) times daily.  . propranolol ER (INDERAL LA) 60 MG 24 hr capsule Take 1 capsule (60 mg total) by mouth daily.  . traZODone (DESYREL) 50 MG tablet TAKE 1 AND 1/2 TABLETS BY MOUTH AT BEDTIME AS NEEEDED FOR SLEEP   No facility-administered encounter medications on file as of 09/28/2018.     Activities of Daily Living In your present state of health, do you have any difficulty performing the following activities: 09/28/2018  Hearing? Y  Vision? Y  Comment cataract surgery pending for right eye  Difficulty concentrating or making decisions? Y  Walking or climbing stairs? N  Dressing or bathing? N  Doing errands, shopping? N  Preparing Food and eating ? N  Using the Toilet? N  In the past six months, have you accidently leaked urine? N  Do you have problems with loss of bowel control? N  Managing your Medications? N  Managing your Finances? N  Housekeeping or managing your Housekeeping? N  Some  recent data might be hidden    Patient Care Team: [name], MD as PCP - General (Family Medicine) Kary Kos, MD as Consulting Physician (Neurosurgery) Estill Cotta, MD as Referring Physician (Ophthalmology) Oneta Rack, MD as Referring Physician (Dermatology) Kary Kos, MD as Consulting Physician (Neurosurgery)    Assessment:   This is a routine wellness examination for [place].  Hearing Screening Comments: Hearing aids Vision Screening Comments: Vision exam in 2019 @ [place] and Dietary recommendations Current Exercise Habits: Home exercise routine, Type of exercise: walking, Time (Minutes): 30, Frequency (Times/Week): 7, Weekly Exercise (Minutes/Week): 210, Intensity: Mild, Exercise limited by: orthopedic condition(s)  Goals    . medication adherence     Starting 09/28/2018, I will continue to take medications as prescribed in an effort to manage my health conditions.        Fall Risk Fall Risk  09/28/2018 09/25/2017 01/06/2017 03/14/2016 03/07/2015  Falls in the past year? 1 No Yes No No  Comment multiple falls due to vertigo; unable to specify quanity - - - -  Number falls in past yr: 1 - 2 or more - -  Injury with Fall? 0 - No - -  Risk Factor Category  - - High Fall Risk - -  Follow up - - Falls evaluation completed - -   Depression Screen PHQ 2/9 Scores 09/28/2018 09/25/2017 03/14/2016 03/07/2015  PHQ - 2 Score 0 0 0 1  PHQ- 9 Score 0 0 - -     Cognitive Function MMSE - Mini Mental State Exam 09/28/2018 12/17/2017 09/25/2017 03/14/2016  Orientation to time 5 4 5 5   Orientation to Place 5 5  5 5  Registration 3 3 3 3   Attention/ Calculation 0 5 0 0  Recall 2 3 3 3   Recall-comments unable to recall 1 of 3 words - - -  Language- name 2 objects 0 2 0 0  Language- repeat 1 1 1 1   Language- follow 3 step command 3 3 3  0  Language- follow 3 step command-comments - - - due to hx of stroke, pt is unable to write  Language- read &  follow direction 0 1 0 0  Write a sentence 0 1 0 0  Copy design 0 1 0 0  Total score 19 29 20 17      PLEASE NOTE: A Mini-Cog screen was completed. Maximum score is 20. A value of 0 denotes this part of [place] MMSE was not completed or the patient failed this part of the Mini-Cog screening.   Mini-Cog Screening Orientation to Time - Max 5 pts Orientation to Place - Max 5 pts Registration - Max 3 pts Recall - Max 3 pts Language Repeat - Max 1 pts Language Follow 3 Step Command - Max 3 pts     Immunization History  Administered Date(s) Administered  . Influenza Split 07/10/2011, 05/28/2012  . Influenza Whole 06/08/2008, 05/29/2009, 05/22/2010  . Influenza, High Dose Seasonal PF 06/09/2018  . Influenza,inj,Quad PF,6+ Mos 06/09/2018  . Influenza-Unspecified 06/30/2015, 07/12/2016, 06/13/2017  . Pneumococcal Conjugate-13 09/09/2014  . Pneumococcal Polysaccharide-23 02/19/2010, 01/03/2016  . Td 11/24/2006  . Tdap 12/13/2016  . Zoster 12/22/2009    Screening Tests Health Maintenance  Topic Date Due  . HEMOGLOBIN A1C  02/03/2019  . OPHTHALMOLOGY EXAM  07/08/2019  . FOOT EXAM  09/17/2019  . COLONOSCOPY  04/14/2022  . TETANUS/TDAP  12/14/2026  . INFLUENZA VACCINE  Completed  . Hepatitis C Screening  Completed  . PNA vac Low Risk Adult  Completed      Plan:     I have personally reviewed, addressed, and noted the following in the patient's chart:  A. Medical and social history B. Use of alcohol, tobacco or illicit drugs  C. Current medications and supplements D. Functional ability and status E.  Nutritional status F.  Physical activity G. Advance directives H. List of other physicians I.  Hospitalizations, surgeries, and ER visits in previous 12 months J.  [place] to include hearing, vision, cognitive, depression L. Referrals and appointments - none  In addition, I have reviewed and discussed with patient certain preventive protocols, quality metrics,  and best practice recommendations. A written personalized care plan for preventive services as well as general preventive health recommendations were provided to patient.  See attached scanned questionnaire for additional information.   Signed,   Lindell Noe, MHA, BS, LPN Health Coach

## 2018-09-28 NOTE — Patient Instructions (Addendum)
Walter Harris , Thank you for taking time to come for your Medicare Wellness Visit. I appreciate your ongoing commitment to your health goals. Please review the following plan we discussed and let me know if I can assist you in the future.   These are the goals we discussed: Goals    . medication adherence     Starting 09/28/2018, I will continue to take medications as prescribed in an effort to manage my health conditions.        This is a list of the screening recommended for you and due dates:  Health Maintenance  Topic Date Due  . Hemoglobin A1C  02/03/2019  . Eye exam for diabetics  07/08/2019  . Complete foot exam   09/17/2019  . Colon Cancer Screening  04/14/2022  . Tetanus Vaccine  12/14/2026  . Flu Shot  Completed  .  Hepatitis C: One time screening is recommended by Center for Disease Control  (CDC) for  adults born from [birth year] through 1964.   Completed  . Pneumonia vaccines  Completed   Preventive Care for Adults  A healthy lifestyle and preventive care can promote health and wellness. Preventive health guidelines for adults include the following key practices.  . A routine yearly physical is a good way to check with your health care provider about your health and preventive screening. It is a chance to share any concerns and updates on your health and to receive a thorough exam.  . Visit your dentist for a routine exam and preventive care every 6 months. Brush your teeth twice a day and floss once a day. Good oral hygiene prevents tooth decay and gum disease.  . The frequency of eye exams is based on your age, health, family medical history, use  of contact lenses, and other factors. Follow your health care provider's recommendations for frequency of eye exams.  . Eat a healthy diet. Foods like vegetables, fruits, whole grains, low-fat dairy products, and lean protein foods contain the nutrients you need without too many calories. Decrease your intake of foods high in solid  fats, added sugars, and salt. Eat the right amount of calories for you. Get information about a proper diet from your health care provider, if necessary.  . Regular physical exercise is one of the most important things you can do for your health. Most adults should get at least 150 minutes of moderate-intensity exercise (any activity that increases your heart rate and causes you to sweat) each week. In addition, most adults need muscle-strengthening exercises on 2 or more days a week.  Silver Sneakers may be a benefit available to you. To determine eligibility, you may visit the website: www.silversneakers.com or contact program at 229-251-1982 Mon-Fri between 8AM-8PM.   . Maintain a healthy weight. The body mass index (BMI) is a screening tool to identify possible weight problems. It provides an estimate of body fat based on height and weight. Your health care provider can find your BMI and can help you achieve or maintain a healthy weight.   For adults 20 years and older: ? A BMI below 18.5 is considered underweight. ? A BMI of 18.5 to 24.9 is normal. ? A BMI of 25 to 29.9 is considered overweight. ? A BMI of 30 and above is considered obese.   . Maintain normal blood lipids and cholesterol levels by exercising and minimizing your intake of saturated fat. Eat a balanced diet with plenty of fruit and vegetables. Blood tests for lipids and cholesterol  should begin at age 26 and be repeated every 5 years. If your lipid or cholesterol levels are high, you are over 50, or you are at high risk for heart disease, you may need your cholesterol levels checked more frequently. Ongoing high lipid and cholesterol levels should be treated with medicines if diet and exercise are not working.  . If you smoke, find out from your health care provider how to quit. If you do not use tobacco, please do not start.  . If you choose to drink alcohol, please do not consume more than 2 drinks per day. One drink is  considered to be 12 ounces (355 mL) of beer, 5 ounces (148 mL) of wine, or 1.5 ounces (44 mL) of liquor.  . If you are 57-76 years old, ask your health care provider if you should take aspirin to prevent strokes.  . Use sunscreen. Apply sunscreen liberally and repeatedly throughout the day. You should seek shade when your shadow is shorter than you. Protect yourself by wearing long sleeves, pants, a wide-brimmed hat, and sunglasses year round, whenever you are outdoors.  . Once a month, do a whole body skin exam, using a mirror to look at the skin on your back. Tell your health care provider of new moles, moles that have irregular borders, moles that are larger than a pencil eraser, or moles that have changed in shape or color.

## 2018-09-28 NOTE — Progress Notes (Signed)
PCP notes:   Health maintenance:  No gaps identified.  Abnormal screenings:   Mini-cog score: 19/20 MMSE - Mini Mental State Exam 09/28/2018 12/17/2017 09/25/2017 03/14/2016  Orientation to time 5 4 5 5   Orientation to Place 5 5 5 5   Registration 3 3 3 3   Attention/ Calculation 0 5 0 0  Recall 2 3 3 3   Recall-comments unable to recall 1 of 3 words - - -  Language- name 2 objects 0 2 0 0  Language- repeat 1 1 1 1   Language- follow 3 step command 3 3 3  0  Language- follow 3 step command-comments - - - due to hx of stroke, pt is unable to write  Language- read & follow direction 0 1 0 0  Write a sentence 0 1 0 0  Copy design 0 1 0 0  Total score 19 29 20 17     Patient concerns:   Pain in left elbow, right wrist, and entire back  Nurse concerns:  BP elevated @ 160/92. Patient was very excited discussing his children and grandchildren. Denied depression but states he has nothing left to live for at this time and feels lonely. Patient verbalized his concerns with his tremors.  Next PCP appt:   10/01/18 @ 1045  Will d/w pt at OV re: the above.  I reviewed health advisor's note, was available for consultation on the day of service listed in this note, and agree with documentation and plan. Elsie Stain, MD.

## 2018-10-01 ENCOUNTER — Encounter: Payer: PPO | Admitting: Family Medicine

## 2018-10-01 ENCOUNTER — Encounter: Payer: Self-pay | Admitting: Family Medicine

## 2018-10-01 ENCOUNTER — Ambulatory Visit (INDEPENDENT_AMBULATORY_CARE_PROVIDER_SITE_OTHER): Payer: PPO | Admitting: Family Medicine

## 2018-10-01 VITALS — BP 150/78 | HR 77 | Temp 97.5°F | Ht 69.0 in | Wt 197.2 lb

## 2018-10-01 DIAGNOSIS — G8929 Other chronic pain: Secondary | ICD-10-CM

## 2018-10-01 DIAGNOSIS — Z0000 Encounter for general adult medical examination without abnormal findings: Secondary | ICD-10-CM

## 2018-10-01 DIAGNOSIS — M79646 Pain in unspecified finger(s): Secondary | ICD-10-CM

## 2018-10-01 DIAGNOSIS — M549 Dorsalgia, unspecified: Secondary | ICD-10-CM

## 2018-10-01 DIAGNOSIS — E1142 Type 2 diabetes mellitus with diabetic polyneuropathy: Secondary | ICD-10-CM

## 2018-10-01 DIAGNOSIS — E785 Hyperlipidemia, unspecified: Secondary | ICD-10-CM | POA: Diagnosis not present

## 2018-10-01 DIAGNOSIS — R251 Tremor, unspecified: Secondary | ICD-10-CM | POA: Diagnosis not present

## 2018-10-01 DIAGNOSIS — Z7189 Other specified counseling: Secondary | ICD-10-CM

## 2018-10-01 NOTE — Progress Notes (Signed)
Tetanus 2018.   Flu 2019 Shingles out of stock.  PNA up to date.   Advance directive- wife designated if patient were incapacitated. Colonoscopy 2013 Prostate cancer screening and PSA options (with potential risks and benefits of testing vs not testing) were discussed along with recent recs/guidelines.  He declined testing PSA at this point.  "If I had prostate cancer then I wouldn't do anything about it."   Had had 2/3 recall recently.  He has occ lapses of memory but no red flag events.  He hasn't mixed up his meds, left stove on, etc.  D/w pt.  Reasonable to follow clinically.    3-4 weeks of R hand and wrist pain, had seen ortho in the meantime.  Pain on the radial side of the forearm.  R handed. Pain in radiate up the forearm but not past the elbow.  Pain with thumb abduction/ext of thumb but can grip okay.  Mild tingling.  Ice helps some with swelling.    He is still "cut out" from family members and he is trying to deal with that.  He is sleeping well.  He is taking trazodone at baseline.  His mood is reasonable and he is taking baseline meds w/o ADE.   Chronic pain d/w pt. Still on lyrica BID.  That is helping some and he isn't having as much neck pain now, prev tingling is better.  His pain level is better on higher dose of oxycodone.  No ADE on med. He was able to walk the dog yesterday and that was an improvement.  He still has some leg pain but it is usually more manageable on the medicine.    Diabetes followed by endocrine.  I'll defer, d/w pt. He agrees.  He has adjusted his insulin and got down to 25 units per day.  He had elevation prev with steroid injection as expected.  Sugar was 105 this AM.   Tremor.  Still on propranolol at baseline.  No ADE on med.    Statin intolerant.  Labs d/w pt.   BP usually elevated at MD visit, then normal at home.    PMH and SH reviewed  Meds, vitals, and allergies reviewed.   ROS: Per HPI unless specifically indicated in ROS section    GEN: nad, alert and oriented HEENT: mucous membranes moist NECK: supple w/o LA CV: rrr. PULM: ctab, no inc wob ABD: soft, +bs EXT: no edema SKIN: no acute rash Right hand grossly neurovascular intact without tendon deficit but he has pain on testing for extensor tendinitis of the right thumb.  Not tender to palpation over any bony prominences.  Distally neurovascular intact.  Diabetic foot exam: Normal inspection No skin breakdown No calluses  Normal DP pulses Normal sensation to light touch and monofilament Nails normal

## 2018-10-01 NOTE — Patient Instructions (Addendum)
Get an over the counter thumb spica brace and see if that helps.   Ice may help.    Don't change your meds for now.   If you notice more memory changes then let me know.  Take care.  Glad to see you.

## 2018-10-04 DIAGNOSIS — M79646 Pain in unspecified finger(s): Secondary | ICD-10-CM | POA: Insufficient documentation

## 2018-10-04 DIAGNOSIS — Z0000 Encounter for general adult medical examination without abnormal findings: Secondary | ICD-10-CM | POA: Insufficient documentation

## 2018-10-04 NOTE — Assessment & Plan Note (Signed)
Per endocrinology.  I will defer.  He agrees.

## 2018-10-04 NOTE — Assessment & Plan Note (Signed)
Advance directive- wife designated if patient were incapacitated.  

## 2018-10-04 NOTE — Assessment & Plan Note (Signed)
Statin intolerant.  Labs d/w pt. continue work on diet and exercise.  He agrees.

## 2018-10-04 NOTE — Assessment & Plan Note (Addendum)
Likely tendinitis.  Discussed anatomy and options He can an over the counter thumb spica brace and see if that helps.   Can ice as needed.

## 2018-10-04 NOTE — Assessment & Plan Note (Signed)
Continue tramadol.  He agrees.

## 2018-10-04 NOTE — Assessment & Plan Note (Addendum)
Tetanus 2018.   Flu 2019 Shingles out of stock.  PNA up to date.   Advance directive- wife designated if patient were incapacitated. Colonoscopy 2013 Prostate cancer screening and PSA options (with potential risks and benefits of testing vs not testing) were discussed along with recent recs/guidelines.  He declined testing PSA at this point.  "If I had prostate cancer then I wouldn't do anything about it."   Had had 2/3 recall recently.  He has occ lapses of memory but no red flag events.  He hasn't mixed up his meds, left stove on, etc.  D/w pt.  Reasonable to follow clinically.

## 2018-10-04 NOTE — Assessment & Plan Note (Addendum)
Still on lyrica BID.  That is helping some and he isn't having as much neck pain now, prev tingling is better.  His pain level is better on higher dose of oxycodone.  No ADE on med. He was able to walk the dog yesterday and that was an improvement.  He still has some leg pain but it is usually more manageable on the medicine.  Continue as is.  He agrees.  >25 minutes spent in face to face time with patient, >50% spent in counselling or coordination of care.

## 2018-10-19 DIAGNOSIS — M654 Radial styloid tenosynovitis [de Quervain]: Secondary | ICD-10-CM | POA: Diagnosis not present

## 2018-11-02 ENCOUNTER — Encounter: Payer: Self-pay | Admitting: Family Medicine

## 2018-11-03 ENCOUNTER — Other Ambulatory Visit: Payer: Self-pay | Admitting: Family Medicine

## 2018-11-03 MED ORDER — TIZANIDINE HCL 4 MG PO TABS: 4.0000 mg | ORAL_TABLET | Freq: Four times a day (QID) | ORAL | 0 refills | Status: DC | PRN

## 2018-11-06 ENCOUNTER — Other Ambulatory Visit: Payer: Self-pay | Admitting: Family Medicine

## 2018-11-06 NOTE — Telephone Encounter (Signed)
Name of Medication: Oxycodone Name of Pharmacy: [facility] or Written Date and Quantity: 09-21-18 #120  Last Office Visit and Type: [facility] 10-01-18 Next Office Visit and Type: [facility] 10-05-19 Last Controlled Substance Agreement Date: Never Last UDS: Never

## 2018-11-08 NOTE — Telephone Encounter (Signed)
Sent. Thanks.  Prev tox screen in lab section.  Pain contract prev signed.

## 2018-11-09 NOTE — Telephone Encounter (Signed)
Pt said he [facility]ked with [facility] and oxycodone apap has not been sent in. I spoke with Christy at [facility] and she did find oxycodone apap and will get ready for pick up. Pt voiced understanding and will ck with pharmacy.

## 2018-11-11 DIAGNOSIS — E119 Type 2 diabetes mellitus without complications: Secondary | ICD-10-CM | POA: Diagnosis not present

## 2018-11-11 DIAGNOSIS — H2511 Age-related nuclear cataract, right eye: Secondary | ICD-10-CM | POA: Diagnosis not present

## 2018-11-15 ENCOUNTER — Encounter: Payer: Self-pay | Admitting: Family Medicine

## 2018-11-16 ENCOUNTER — Telehealth: Payer: Self-pay | Admitting: *Deleted

## 2018-11-16 MED ORDER — TIZANIDINE HCL 4 MG PO TABS: 4.0000 mg | ORAL_TABLET | Freq: Two times a day (BID) | ORAL | 3 refills | Status: DC

## 2018-11-16 NOTE — Telephone Encounter (Signed)
Patient called stating that he was given Zanaflex as a muscle relaxer and was only given #30. Patient stated that he has been trying to use them as needed, but some times he takes it too late and the muscle spasm has already gotten him. Patient stated that he needs to take something on a regular basis and not as needed for these spasms. Patient stated that he needs to be given something to take to prevent the spasms and not something as needed and wants refills on it. Patient stated that he is down to only 2 pills left. Pharmacy [facility]

## 2018-11-16 NOTE — Telephone Encounter (Signed)
I would try scheduling the tizanidine doses, 2x/day, with an extra dose if needed.   rx sent.

## 2018-11-16 NOTE — Telephone Encounter (Signed)
Patient advised.

## 2018-11-17 ENCOUNTER — Encounter: Payer: Self-pay | Admitting: *Deleted

## 2018-11-18 DIAGNOSIS — D485 Neoplasm of uncertain behavior of skin: Secondary | ICD-10-CM | POA: Diagnosis not present

## 2018-11-18 DIAGNOSIS — X32XXXA Exposure to sunlight, initial encounter: Secondary | ICD-10-CM | POA: Diagnosis not present

## 2018-11-18 DIAGNOSIS — Z08 Encounter for follow-up examination after completed treatment for malignant neoplasm: Secondary | ICD-10-CM | POA: Diagnosis not present

## 2018-11-18 DIAGNOSIS — D045 Carcinoma in situ of skin of trunk: Secondary | ICD-10-CM | POA: Diagnosis not present

## 2018-11-18 DIAGNOSIS — L821 Other seborrheic keratosis: Secondary | ICD-10-CM | POA: Diagnosis not present

## 2018-11-18 DIAGNOSIS — Z85828 Personal history of other malignant neoplasm of skin: Secondary | ICD-10-CM | POA: Diagnosis not present

## 2018-11-18 DIAGNOSIS — L570 Actinic keratosis: Secondary | ICD-10-CM | POA: Diagnosis not present

## 2018-11-20 ENCOUNTER — Other Ambulatory Visit: Payer: Self-pay | Admitting: Internal Medicine

## 2018-11-23 ENCOUNTER — Other Ambulatory Visit: Payer: Self-pay | Admitting: *Deleted

## 2018-11-23 ENCOUNTER — Encounter: Payer: Self-pay | Admitting: Family Medicine

## 2018-11-23 ENCOUNTER — Other Ambulatory Visit: Payer: Self-pay | Admitting: Family Medicine

## 2018-11-23 MED ORDER — ALPRAZOLAM 0.5 MG PO TABS: ORAL_TABLET | ORAL | 1 refills | Status: DC

## 2018-11-23 NOTE — Telephone Encounter (Signed)
Electronic refill request. Alprazolam Last office visit:   10/01/2018 Last Filled:    60 tablet 1 07/20/2018  Please advise.

## 2018-11-23 NOTE — Telephone Encounter (Signed)
Sent. Thanks.   

## 2018-12-02 ENCOUNTER — Other Ambulatory Visit: Payer: Self-pay | Admitting: Family Medicine

## 2018-12-09 ENCOUNTER — Encounter: Admission: RE | Payer: Self-pay | Source: Home / Self Care

## 2018-12-09 ENCOUNTER — Ambulatory Visit: Admission: RE | Admit: 2018-12-09 | Payer: PPO | Source: Home / Self Care | Admitting: Ophthalmology

## 2018-12-09 DIAGNOSIS — E119 Type 2 diabetes mellitus without complications: Secondary | ICD-10-CM | POA: Diagnosis not present

## 2018-12-09 DIAGNOSIS — I10 Essential (primary) hypertension: Secondary | ICD-10-CM | POA: Diagnosis not present

## 2018-12-09 DIAGNOSIS — R000 Tachycardia, unspecified: Secondary | ICD-10-CM | POA: Diagnosis not present

## 2018-12-09 DIAGNOSIS — D510 Vitamin B12 deficiency anemia due to intrinsic factor deficiency: Secondary | ICD-10-CM | POA: Diagnosis not present

## 2018-12-09 DIAGNOSIS — H2511 Age-related nuclear cataract, right eye: Secondary | ICD-10-CM | POA: Diagnosis not present

## 2018-12-09 DIAGNOSIS — M654 Radial styloid tenosynovitis [de Quervain]: Secondary | ICD-10-CM | POA: Diagnosis not present

## 2018-12-09 DIAGNOSIS — E782 Mixed hyperlipidemia: Secondary | ICD-10-CM | POA: Diagnosis not present

## 2018-12-09 SURGERY — PHACOEMULSIFICATION, CATARACT, WITH IOL INSERTION
Anesthesia: Choice | Laterality: Right

## 2018-12-28 ENCOUNTER — Other Ambulatory Visit: Payer: Self-pay | Admitting: Family Medicine

## 2018-12-28 ENCOUNTER — Encounter: Payer: Self-pay | Admitting: Family Medicine

## 2018-12-28 DIAGNOSIS — M654 Radial styloid tenosynovitis [de Quervain]: Secondary | ICD-10-CM | POA: Diagnosis not present

## 2018-12-28 DIAGNOSIS — G8929 Other chronic pain: Secondary | ICD-10-CM

## 2018-12-29 ENCOUNTER — Other Ambulatory Visit: Payer: Self-pay | Admitting: Family Medicine

## 2018-12-29 ENCOUNTER — Encounter: Payer: Self-pay | Admitting: Family Medicine

## 2018-12-29 MED ORDER — OXYCODONE-ACETAMINOPHEN 5-325 MG PO TABS: ORAL_TABLET | ORAL | 0 refills | Status: DC

## 2018-12-29 NOTE — Telephone Encounter (Signed)
Pt scheduled 01/12/19 @ 2pm.

## 2018-12-29 NOTE — Telephone Encounter (Signed)
Please call patient and schedule appointment as instructed. 

## 2018-12-29 NOTE — Telephone Encounter (Signed)
Indication for chronic opioid:  Chronic neck and back pain Medication and dose: oxycodone 5/325mg  # pills per month: #60 per rx but that usually lasts >1 month.   Last UDS date: 03/14/16 Pain contract signed (Y/N):  yes Date narcotic database last reviewed (include red flags): 12/29/18   rx sent.  Needs routine pain follow up via phone/web in the month, when possible.  Thanks.

## 2018-12-29 NOTE — Telephone Encounter (Signed)
Best number [phone]  Pt called checking on his rx  Pt wants to know the best way to request this refill

## 2018-12-29 NOTE — Assessment & Plan Note (Signed)
Indication for chronic opioid:  Chronic neck and back pain Medication and dose: oxycodone 5/325mg  # pills per month: #60 per rx but that usually lasts >1 month.   Last UDS date: 03/14/16 Pain contract signed (Y/N):  yes Date narcotic database last reviewed (include red flags): 12/29/18

## 2018-12-29 NOTE — Telephone Encounter (Signed)
There is another refill encounter request for this same Rx.  Name of Medication:Oxycodone  Name of [facility] or Written Date and Quantity:11/08/2018 #120  Last Office Visit and Type:Medicare Wellness 10/01/2018  Next Office Visit and Type:Medicare Wellness 10/05/2019  Last Controlled Substance Agreement Date:02/28/2015  Last UDS:03/14/2016

## 2018-12-29 NOTE — Telephone Encounter (Signed)
Name of Medication: Oxycodone  Name of Pharmacy: [facility] or Written Date and Quantity: 11/08/2018 #120   Last Office Visit and Type: Medicare Wellness 10/01/2018  Next Office Visit and Type: Medicare Wellness 10/05/2019  Last Controlled Substance Agreement Date: 02/28/2015  Last UDS: 03/14/2016

## 2018-12-29 NOTE — Telephone Encounter (Signed)
See other note

## 2018-12-30 DIAGNOSIS — M654 Radial styloid tenosynovitis [de Quervain]: Secondary | ICD-10-CM | POA: Diagnosis not present

## 2019-01-12 ENCOUNTER — Encounter: Payer: Self-pay | Admitting: Family Medicine

## 2019-01-12 ENCOUNTER — Ambulatory Visit (INDEPENDENT_AMBULATORY_CARE_PROVIDER_SITE_OTHER): Payer: PPO | Admitting: Family Medicine

## 2019-01-12 DIAGNOSIS — G8929 Other chronic pain: Secondary | ICD-10-CM

## 2019-01-12 DIAGNOSIS — M549 Dorsalgia, unspecified: Secondary | ICD-10-CM | POA: Diagnosis not present

## 2019-01-12 MED ORDER — TIZANIDINE HCL 4 MG PO TABS: 4.0000 mg | ORAL_TABLET | Freq: Three times a day (TID) | ORAL | 3 refills | Status: DC | PRN

## 2019-01-12 NOTE — Progress Notes (Signed)
Interactive audio and video telecommunications were attempted between this provider and patient, however failed, due to patient having technical difficulties OR patient did not have access to video capability.  We continued and completed visit with audio only.   Virtual Visit via Telephone Note  I connected with patient on 01/12/19 at 2:13 PM by telephone and verified that I am speaking with the correct person using two identifiers.  Location of patient: home  Location of MD: [place] Name of referring provider (if blank then none associated): Names per persons and role in encounter:  MD: Earlyne Iba, Patient: name listed above.    I discussed the limitations, risks, security and privacy concerns of performing an evaluation and management service by telephone and the availability of in person appointments. I also discussed with the patient that there may be a patient responsible charge related to this service. The patient expressed understanding and agreed to proceed.  CC: pain follow up.    History of Present Illness:   Pandemic considerations d/w pt.  Indication for chronic opioid: Chronic neck and back pain Medication and dose: oxycodone 5/325mg  # pills per month: #60 per rx but that usually lasts >1 month.  Last UDS date: 03/14/16 Pain contract signed (Y/N): yes Date narcotic database last reviewed (include red flags): 12/29/18  ================= Prev with MRI from 2015 noted: IMPRESSION:  Multilevel spondylosis as described. Potentially symptomatic neural impingement at L1-2, L2-3, L3-4, L4-5, and L5-S1.   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 Disc space narrowing with bilateral foraminal narrowing is the dominant feature at this level.   He had to have another MRI in late 2019 but couldn't get contrast for the study "because they couldn't find a vein."  Then f/u delayed due to pandemic.   =================  He was recently only pred dosepack per ortho with  improvement in pain.    Pain inventory (1-10) Average pain: 5/10 is tolerable for patient.  Pain now: "very well right now."  My pain is "tolerable and constant" with pain meds.  Pain is worse with activity.   Relief from meds: yes  In the last 24 hours, how much has pain interfered with the following (1-10 greatest interference)? General activity- sig limitation w/o pain meds.   Relationships with others- no changes recently.  Enjoyment of life- no changes recently.  What time of the day is the pain the worst- at night.  Sleep is described by patient as- 5-6 hours at night, better with trazodone.  Some nights are better than others.   Mobility/function Assistance device: cane not used due to wrist pain.   How many minutes can you walk: up to 20-30 minutes at a time.   Able to climb steps:yes Driving:yes Disabled:no  Bowel or bladder symptoms: no Mood: at baseline. No changes  Physicians involved in care: still seeing endocrine, ortho.   Any changes since last visit? He has ortho f/u pending for tomorrow with prev surgery noted.  He has a persistent knot on the wrist, persistent for weeks to months but not acute.    Tizanidine clearly helped.  He is taking 3 a day on most days.  D/w pt about taking 4th tab per day.  No sedation.     Observations/Objective:nad Speech wnl.    Assessment and Plan:  Indication for chronic opioid: Chronic neck and back pain Medication and dose: oxycodone 5/325mg  # pills per month: #60 per rx but that usually lasts >1 month.  Last UDS date: 03/14/16  Pain contract signed (Y/N): yes Date narcotic database last reviewed (include red flags): 12/29/18   Tizanidine clearly helped.  He is taking 3 a day on most days.  D/w pt about taking 4th tab per day.  No sedation.   He can try an OTC magnesium oxide 400mg  supplement to see if tolerated/beneficial for muscle cramping.  Follow Up Instructions: plan on f/u in 3 months.  D/w pt.   I discussed the  assessment and treatment plan with the patient. The patient was provided an opportunity to ask questions and all were answered. The patient agreed with the plan and demonstrated an understanding of the instructions.   The patient was advised to call back or seek an in-person evaluation if the symptoms worsen or if the condition fails to improve as anticipated.  I provided 15 minutes of non-face-to-face time during this encounter.  Elsie Stain, MD

## 2019-01-13 ENCOUNTER — Encounter: Payer: Self-pay | Admitting: Family Medicine

## 2019-01-13 NOTE — Assessment & Plan Note (Addendum)
Indication for chronic opioid: Chronic neck and back pain Medication and dose: oxycodone 5/325mg  # pills per month: #60 per rx but that usually lasts >1 month.  Last UDS date: 03/14/16 Pain contract signed (Y/N): yes Date narcotic database last reviewed (include red flags): 12/29/18   Routine opiate cautions discussed with patient.  No adverse effect on medication.  Reasonable to continue opiates as is for now.  Tizanidine clearly helped.  He is taking 3 a day on most days.  D/w pt about taking 4th tab per day.  No sedation.   He can try an OTC magnesium oxide 400mg  supplement to see if tolerated/beneficial for muscle cramping.  Follow Up Instructions: plan on f/u in 3 months.  D/w pt.

## 2019-01-14 ENCOUNTER — Other Ambulatory Visit: Payer: Self-pay | Admitting: Family Medicine

## 2019-01-14 DIAGNOSIS — M25539 Pain in unspecified wrist: Secondary | ICD-10-CM

## 2019-01-18 ENCOUNTER — Other Ambulatory Visit: Payer: Self-pay | Admitting: Family Medicine

## 2019-01-19 DIAGNOSIS — E1169 Type 2 diabetes mellitus with other specified complication: Secondary | ICD-10-CM | POA: Diagnosis not present

## 2019-01-19 DIAGNOSIS — Z794 Long term (current) use of insulin: Secondary | ICD-10-CM | POA: Diagnosis not present

## 2019-01-19 DIAGNOSIS — E1165 Type 2 diabetes mellitus with hyperglycemia: Secondary | ICD-10-CM | POA: Diagnosis not present

## 2019-01-19 DIAGNOSIS — E785 Hyperlipidemia, unspecified: Secondary | ICD-10-CM | POA: Diagnosis not present

## 2019-01-19 NOTE — Telephone Encounter (Signed)
Electronic refill request Fluoxetine Last office visit 01/12/19  Chart shows one daily  Request from pharmacy does not match which they are requesting 1-2 daily

## 2019-01-20 NOTE — Telephone Encounter (Signed)
Please verify with patient.  I thought he was only taking 10mg  a day.  If so, then send in as listed.  O/w let me know and I'll adjust the sig and send.  Thanks.

## 2019-01-21 NOTE — Telephone Encounter (Signed)
Spoke to pt

## 2019-01-21 NOTE — Telephone Encounter (Signed)
I sent the rx for 10-20mg  a day.  If patient isn't doing well on 10mg , it would be okay to inc to 20mg  a day.  And I'm glad to see him at web/phone visit if useful. Thanks.

## 2019-01-21 NOTE — Telephone Encounter (Signed)
Pt said it is usually 1 a day, but takes 2 sometimes if he is feeling more down. His wife says he needs 2 a day. Could he change to 20mg  1 a day. He used to take that much.

## 2019-02-01 ENCOUNTER — Other Ambulatory Visit: Payer: Self-pay | Admitting: Family Medicine

## 2019-02-02 NOTE — Telephone Encounter (Signed)
Electronic refill request. Alprazolam Last office visit:   10/01/2018 Last Filled:    60 tablet 1 11/23/2018   Electronic refill request. Oxycodone Last office visit:   10/01/2018 Last Filled:    120 tablet 0 12/29/2018   Please advise.

## 2019-02-03 ENCOUNTER — Encounter: Payer: Self-pay | Admitting: Family Medicine

## 2019-02-03 NOTE — Telephone Encounter (Signed)
We need more time on these, ie several business days.   Sent. Thanks.

## 2019-02-03 NOTE — Telephone Encounter (Signed)
Spouse (judy) called needing to get pt meds refilled ASAP.  She stated pt is out of alprazolam.  He was out yesterday 5/26   [facility]  Best number [phone] Please call pt when this has been called in  [facility] stated they were out of both meds  Alprazolam and oxycodone

## 2019-02-03 NOTE — Telephone Encounter (Signed)
Patient advised RXs have been filled. Patient said thank you and he already picked them up

## 2019-02-03 NOTE — Telephone Encounter (Signed)
See refill request.

## 2019-02-28 ENCOUNTER — Other Ambulatory Visit: Payer: Self-pay | Admitting: Family Medicine

## 2019-03-01 NOTE — Telephone Encounter (Signed)
Name of Medication: Oxycodone Name of Pharmacy: [facility] or Written Date and Quantity:  120 tablet 0 02/03/2019  Last Office Visit and Type: 01/12/2019 Next Office Visit and Type: AMW 10/05/2019 Last Controlled Substance Agreement Date: 02/28/2015 Last UDS:03/14/2016

## 2019-03-02 ENCOUNTER — Other Ambulatory Visit: Payer: Self-pay | Admitting: Family Medicine

## 2019-03-02 MED ORDER — OXYCODONE-ACETAMINOPHEN 5-325 MG PO TABS: ORAL_TABLET | ORAL | 0 refills | Status: DC

## 2019-03-02 NOTE — Telephone Encounter (Signed)
Sent. Thanks.   

## 2019-03-02 NOTE — Telephone Encounter (Signed)
Electronic refill request. Propranolol ER Last office visit:   01/12/2019 Last Filled:    90 capsule 3 12/17/2017  By Dr. Jannifer Franklin.  Pharmacy says refill request sent to Dr. Jannifer Franklin and he denied it saying patient needs an appointment so patient asked that the request be sent to Dr. [name].

## 2019-03-03 NOTE — Telephone Encounter (Signed)
Sent but he still needs to schedule f/u with Dr. Jannifer Franklin.  This was a short term rx.

## 2019-03-04 NOTE — Telephone Encounter (Signed)
Letter mailed

## 2019-03-25 DIAGNOSIS — E119 Type 2 diabetes mellitus without complications: Secondary | ICD-10-CM | POA: Diagnosis not present

## 2019-04-05 ENCOUNTER — Other Ambulatory Visit: Payer: Self-pay | Admitting: Family Medicine

## 2019-04-05 NOTE — Telephone Encounter (Signed)
Electronic refill request. Alprazolam Last office visit:   01/12/2019 Last Filled:    60 tablet 1 02/03/2019  Please advise.

## 2019-04-06 NOTE — Telephone Encounter (Signed)
Sent. Thanks.   

## 2019-04-07 ENCOUNTER — Other Ambulatory Visit: Payer: Self-pay

## 2019-04-07 ENCOUNTER — Encounter: Payer: Self-pay | Admitting: *Deleted

## 2019-04-08 ENCOUNTER — Other Ambulatory Visit
Admission: RE | Admit: 2019-04-08 | Discharge: 2019-04-08 | Disposition: A | Discharge status: Home / Self Care | Payer: PPO | Source: Ambulatory Visit | Attending: Ophthalmology | Admitting: Ophthalmology

## 2019-04-08 DIAGNOSIS — Z20828 Contact with and (suspected) exposure to other viral communicable diseases: Secondary | ICD-10-CM | POA: Diagnosis not present

## 2019-04-08 LAB — SARS CORONAVIRUS 2 (TAT 6-24 HRS): SARS Coronavirus 2: NEGATIVE

## 2019-04-08 NOTE — Discharge Instructions (Signed)

## 2019-04-12 ENCOUNTER — Encounter
Admission: RE | Disposition: A | Discharge status: Home / Self Care | Payer: Self-pay | Source: Home / Self Care | Attending: Ophthalmology

## 2019-04-12 ENCOUNTER — Ambulatory Visit: Payer: PPO | Admitting: Anesthesiology

## 2019-04-12 ENCOUNTER — Ambulatory Visit
Admission: RE | Admit: 2019-04-12 | Discharge: 2019-04-12 | Disposition: A | Discharge status: Home / Self Care | Payer: PPO | Attending: Ophthalmology | Admitting: Ophthalmology

## 2019-04-12 DIAGNOSIS — H25811 Combined forms of age-related cataract, right eye: Secondary | ICD-10-CM | POA: Diagnosis not present

## 2019-04-12 DIAGNOSIS — E1136 Type 2 diabetes mellitus with diabetic cataract: Secondary | ICD-10-CM | POA: Diagnosis not present

## 2019-04-12 DIAGNOSIS — Z794 Long term (current) use of insulin: Secondary | ICD-10-CM | POA: Insufficient documentation

## 2019-04-12 DIAGNOSIS — Z85828 Personal history of other malignant neoplasm of skin: Secondary | ICD-10-CM | POA: Insufficient documentation

## 2019-04-12 DIAGNOSIS — I10 Essential (primary) hypertension: Secondary | ICD-10-CM | POA: Diagnosis not present

## 2019-04-12 DIAGNOSIS — Z79899 Other long term (current) drug therapy: Secondary | ICD-10-CM | POA: Diagnosis not present

## 2019-04-12 DIAGNOSIS — Z87891 Personal history of nicotine dependence: Secondary | ICD-10-CM | POA: Diagnosis not present

## 2019-04-12 DIAGNOSIS — H2511 Age-related nuclear cataract, right eye: Secondary | ICD-10-CM | POA: Insufficient documentation

## 2019-04-12 DIAGNOSIS — F419 Anxiety disorder, unspecified: Secondary | ICD-10-CM | POA: Insufficient documentation

## 2019-04-12 HISTORY — DX: Presence of dental prosthetic device (complete) (partial): Z97.2

## 2019-04-12 HISTORY — PX: CATARACT EXTRACTION W/PHACO: SHX586

## 2019-04-12 HISTORY — DX: Dizziness and giddiness: R42

## 2019-04-12 LAB — GLUCOSE, CAPILLARY
Glucose-Capillary: 135 mg/dL — ABNORMAL HIGH (ref 70–99)
Glucose-Capillary: 132 mg/dL — ABNORMAL HIGH (ref 70–99)

## 2019-04-12 SURGERY — PHACOEMULSIFICATION, CATARACT, WITH IOL INSERTION
Anesthesia: Monitor Anesthesia Care | Site: Eye | Laterality: Right

## 2019-04-12 MED ORDER — EPINEPHRINE PF 1 MG/ML IJ SOLN
INTRAOCULAR | Status: DC | PRN
  Administered 2019-04-12: 75 mL via OPHTHALMIC

## 2019-04-12 MED ORDER — FENTANYL CITRATE (PF) 100 MCG/2ML IJ SOLN
INTRAMUSCULAR | Status: DC | PRN
  Administered 2019-04-12: 50 ug via INTRAVENOUS

## 2019-04-12 MED ORDER — ARMC OPHTHALMIC DILATING DROPS
1.0000 "application " | OPHTHALMIC | Status: DC | PRN
  Administered 2019-04-12 (×3): 1 via OPHTHALMIC

## 2019-04-12 MED ORDER — MOXIFLOXACIN HCL 0.5 % OP SOLN
OPHTHALMIC | Status: DC | PRN
  Administered 2019-04-12: 0.2 mL via OPHTHALMIC

## 2019-04-12 MED ORDER — SODIUM HYALURONATE 23 MG/ML IO SOLN
INTRAOCULAR | Status: DC | PRN
  Administered 2019-04-12: 0.6 mL via INTRAOCULAR

## 2019-04-12 MED ORDER — MIDAZOLAM HCL 2 MG/2ML IJ SOLN
INTRAMUSCULAR | Status: DC | PRN
  Administered 2019-04-12: 1 mg via INTRAVENOUS

## 2019-04-12 MED ORDER — TETRACAINE HCL 0.5 % OP SOLN
1.0000 [drp] | OPHTHALMIC | Status: DC | PRN
  Administered 2019-04-12 (×3): 1 [drp] via OPHTHALMIC

## 2019-04-12 MED ORDER — SODIUM HYALURONATE 10 MG/ML IO SOLN
INTRAOCULAR | Status: DC | PRN
  Administered 2019-04-12: 0.55 mL via INTRAOCULAR

## 2019-04-12 MED ORDER — LIDOCAINE HCL (PF) 2 % IJ SOLN
INTRAOCULAR | Status: DC | PRN
  Administered 2019-04-12: 1 mL via INTRAOCULAR

## 2019-04-12 SURGICAL SUPPLY — 19 items
CANNULA ANT/CHMB 27G (MISCELLANEOUS) ×2 IMPLANT
CANNULA ANT/CHMB 27GA (MISCELLANEOUS) ×6 IMPLANT
DISSECTOR HYDRO NUCLEUS 50X22 (MISCELLANEOUS) ×3 IMPLANT
GLOVE SURG LX 7.5 STRW (GLOVE) ×2
GLOVE SURG LX STRL 7.5 STRW (GLOVE) ×1 IMPLANT
GLOVE SURG SYN 8.5  E (GLOVE) ×2
GLOVE SURG SYN 8.5 E (GLOVE) ×1 IMPLANT
GLOVE SURG SYN 8.5 PF PI (GLOVE) ×1 IMPLANT
GOWN STRL REUS W/ TWL LRG LVL3 (GOWN DISPOSABLE) ×2 IMPLANT
GOWN STRL REUS W/TWL LRG LVL3 (GOWN DISPOSABLE) ×4
LENS IOL TECNIS ITEC 20.5 (Intraocular Lens) ×2 IMPLANT
MARKER SKIN DUAL TIP RULER LAB (MISCELLANEOUS) ×3 IMPLANT
PACK DR. KING ARMS (PACKS) ×3 IMPLANT
PACK EYE AFTER SURG (MISCELLANEOUS) ×3 IMPLANT
PACK OPTHALMIC (MISCELLANEOUS) ×3 IMPLANT
SYR 3ML LL SCALE MARK (SYRINGE) ×3 IMPLANT
SYR TB 1ML LUER SLIP (SYRINGE) ×3 IMPLANT
WATER STERILE IRR 250ML POUR (IV SOLUTION) ×3 IMPLANT
WIPE NON LINTING 3.25X3.25 (MISCELLANEOUS) ×3 IMPLANT

## 2019-04-12 NOTE — Anesthesia Procedure Notes (Signed)
Procedure Name: MAC Date/Time: 04/12/2019 8:49 AM Performed by: Cameron Ali, CRNA Pre-anesthesia Checklist: Patient identified, Emergency Drugs available, Suction available, Timeout performed and Patient being monitored Patient Re-evaluated:Patient Re-evaluated prior to induction Oxygen Delivery Method: Nasal cannula Placement Confirmation: positive ETCO2

## 2019-04-12 NOTE — Transfer of Care (Signed)
Immediate Anesthesia Transfer of Care Note  Patient: Walter Harris  Procedure(s) Performed: CATARACT EXTRACTION PHACO AND INTRAOCULAR LENS PLACEMENT (IOC) RIGHT, DIABETIC (Right Eye)  Patient Location: PACU  Anesthesia Type: MAC  Level of Consciousness: awake, alert  and patient cooperative  Airway and Oxygen Therapy: Patient Spontanous Breathing and Patient connected to supplemental oxygen  Post-op Assessment: Post-op Vital signs reviewed, Patient's Cardiovascular Status Stable, Respiratory Function Stable, Patent Airway and No signs of Nausea or vomiting  Post-op Vital Signs: Reviewed and stable  Complications: No apparent anesthesia complications

## 2019-04-12 NOTE — Anesthesia Preprocedure Evaluation (Signed)
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)    GI/Hepatic negative GI ROS, Neg liver ROS,   Endo/Other  diabetes, Type 2, Insulin Dependent  Renal/GU      Musculoskeletal   Abdominal   Peds  Hematology   Anesthesia Other Findings   Reproductive/Obstetrics                             Anesthesia Physical Anesthesia Plan  ASA: III  Anesthesia Plan: MAC   Post-op Pain Management:    Induction: Intravenous  PONV Risk Score and Plan: 1  Airway Management Planned: Nasal Cannula  Additional Equipment: None  Intra-op Plan:   Post-operative Plan:   Informed Consent: I have reviewed the patients History and Physical, chart, labs and discussed the procedure including the risks, benefits and alternatives for the proposed anesthesia with the patient or authorized representative who has indicated his/her understanding and acceptance.       Plan Discussed with:   Anesthesia Plan Comments:         Anesthesia Quick Evaluation

## 2019-04-12 NOTE — Anesthesia Postprocedure Evaluation (Signed)
Anesthesia Post Note  Patient: Walter Harris  Procedure(s) Performed: CATARACT EXTRACTION PHACO AND INTRAOCULAR LENS PLACEMENT (IOC) RIGHT, DIABETIC (Right Eye)  Patient location during evaluation: PACU Anesthesia Type: MAC Level of consciousness: awake and alert Pain management: pain level controlled Vital Signs Assessment: post-procedure vital signs reviewed and stable Respiratory status: spontaneous breathing Cardiovascular status: blood pressure returned to baseline Postop Assessment: no apparent nausea or vomiting, adequate PO intake and no headache Anesthetic complications: no    Adele Barthel Satya Bohall

## 2019-04-12 NOTE — H&P (Signed)

## 2019-04-12 NOTE — Op Note (Signed)
OPERATIVE NOTE  TOREN TUCHOLSKI [id number] 04/12/2019   PREOPERATIVE DIAGNOSIS:  Nuclear sclerotic cataract right eye.  H25.11   POSTOPERATIVE DIAGNOSIS:    Nuclear sclerotic cataract right eye.     PROCEDURE:  Phacoemusification with posterior chamber intraocular lens placement of the right eye   LENS:   Implant Name Type Inv. Item Serial No. Manufacturer Lot No. LRB No. Used Action  LENS IOL DIOP 20.5 - [id number] Intraocular Lens LENS IOL DIOP 20.5 [id number]  Right 1 Implanted       PCB00 +20.5   ULTRASOUND TIME: 0 minutes 42 seconds.  CDE 5.74   SURGEON:  Benay Pillow, MD, MPH  ANESTHESIOLOGIST: Anesthesiologist: Page, Adele Barthel, MD CRNA: Cameron Ali, CRNA   ANESTHESIA:  Topical with tetracaine drops augmented with 1% preservative-free intracameral lidocaine.  ESTIMATED BLOOD LOSS: less than 1 mL.   COMPLICATIONS:  None.   DESCRIPTION OF PROCEDURE:  The patient was identified in the holding room and transported to the operating room and placed in the supine position under the operating microscope.  The right eye was identified as the operative eye and it was prepped and draped in the usual sterile ophthalmic fashion.   A 1.0 millimeter clear-corneal paracentesis was made at the 10:30 position. 0.5 ml of preservative-free 1% lidocaine with epinephrine was injected into the anterior chamber.  The anterior chamber was filled with Healon 5 viscoelastic.  A 2.4 millimeter keratome was used to make a near-clear corneal incision at the 8:00 position.  A curvilinear capsulorrhexis was made with a cystotome and capsulorrhexis forceps.  Balanced salt solution was used to hydrodissect and hydrodelineate the nucleus.   Phacoemulsification was then used in stop and chop fashion to remove the lens nucleus and epinucleus.  The remaining cortex was then removed using the irrigation and aspiration handpiece. Healon was then placed into the capsular bag to distend it for lens placement.  A  lens was then injected into the capsular bag.  The remaining viscoelastic was aspirated.   Wounds were hydrated with balanced salt solution.  The anterior chamber was inflated to a physiologic pressure with balanced salt solution.   Intracameral vigamox 0.1 mL undiluted was injected into the eye and a drop placed onto the ocular surface.  No wound leaks were noted.  The patient was taken to the recovery room in stable condition without complications of anesthesia or surgery  Benay Pillow 04/12/2019, 9:10 AM

## 2019-04-13 ENCOUNTER — Other Ambulatory Visit: Payer: Self-pay | Admitting: Family Medicine

## 2019-04-13 ENCOUNTER — Encounter: Payer: Self-pay | Admitting: Ophthalmology

## 2019-04-14 ENCOUNTER — Other Ambulatory Visit: Payer: Self-pay | Admitting: Family Medicine

## 2019-04-14 NOTE — Telephone Encounter (Signed)
Sent. Thanks.  Needs 3 month pain med f/u scheduled when possible.

## 2019-04-14 NOTE — Telephone Encounter (Signed)
Received electronic refill request   Name of Medication:Oxycodone  Name of Pharmacy: [facility] or Written Date and Quantity: 03/02/19 #120 Last Office Visit and Type: 01/12/19 Next Office Visit and Type: 10/05/19 AMW Last Controlled Substance Agreement Date: 621/16 Last UDS: 03/14/16

## 2019-04-15 ENCOUNTER — Encounter: Payer: Self-pay | Admitting: Family Medicine

## 2019-04-15 NOTE — Telephone Encounter (Signed)
Appointment made for phone visit if that is ok. No video capability. Patient just had cataract surgery and will have another one at the ned of the month so he is not able to drive or do much. Let me know if this is not sufficient enough. Thank you

## 2019-04-15 NOTE — Telephone Encounter (Signed)
Great, thanks

## 2019-04-19 ENCOUNTER — Ambulatory Visit (INDEPENDENT_AMBULATORY_CARE_PROVIDER_SITE_OTHER): Payer: PPO | Admitting: Family Medicine

## 2019-04-19 ENCOUNTER — Other Ambulatory Visit: Payer: Self-pay

## 2019-04-19 ENCOUNTER — Telehealth: Payer: Self-pay | Admitting: Family Medicine

## 2019-04-19 DIAGNOSIS — G8929 Other chronic pain: Secondary | ICD-10-CM

## 2019-04-19 DIAGNOSIS — M549 Dorsalgia, unspecified: Secondary | ICD-10-CM

## 2019-04-19 NOTE — Telephone Encounter (Signed)
I called and spoke to Walter Harris regarding his dismissal from the practice.  I informed him that based up on discussion with Dr. [name] and the staff we are retracting the dismissal and he will be able to remain a patient based upon his adamant apologies he expressed during today's visit.  He was very grateful and spoke highly of Dr. [name]. He admitted that he has been trapped in his house for the past 5 months and isnt thinking clearly and apologized again for his behavior.

## 2019-04-19 NOTE — Progress Notes (Signed)
Interactive audio and video telecommunications were attempted between this provider and patient, however failed, due to patient having technical difficulties OR patient did not have access to video capability.  We continued and completed visit with audio only.   Virtual Visit via Telephone Note  I connected with patient on 04/19/19  at 4:01 PM  by telephone and verified that I am speaking with the correct person using two identifiers.  Location of patient: home  Location of MD: [place] Name of referring provider (if blank then none associated): Names per persons and role in encounter:  MD: Earlyne Iba, Patient: name listed above.    I discussed the limitations, risks, security and privacy concerns of performing an evaluation and management service by telephone and the availability of in person appointments. I also discussed with the patient that there may be a patient responsible charge related to this service. The patient expressed understanding and agreed to proceed.  CC: pain follow up.   History of Present Illness:  Discussed his pain medicine/pain level.  "My pain is tolerable with the pain medicine, like a 4-5 now and I'm tickled with that."  No sedation from med.  He takes a nap in the day but that is at baseline.    We talked about his prev mychart message.  His language was inappropriate.  We had already approved his prescription but we had a duplicate refill request and we had to deny 1 of them.  My initial disposition was to discharge the patient based on the language that he used but he was so apologetic about his reaction that I told him I would go back and check with the practice administrator and the staff who had been dealing with him to make sure they were okay with him being a patient in the clinic.  See follow-up phone note.  He was profoundly apologetic.  I told him I would not ever tolerate that type of language being directed at staff here in the clinic.  He  acknowledges that we work hard to provide him with appropriate care and good pain control.   Observations/Objective: nad Speech wnl.   Assessment and Plan: Chronic pain Discussed his pain medicine/pain level.  "My pain is tolerable with the pain medicine, like a 4-5 now and I'm tickled with that."  No sedation from med.  He takes a nap in the day but that is at baseline.   No change in meds at this time.  See above regarding non-dismissal.  Follow Up Instructions: See above.   I discussed the assessment and treatment plan with the patient. The patient was provided an opportunity to ask questions and all were answered. The patient agreed with the plan and demonstrated an understanding of the instructions.   The patient was advised to call back or seek an in-person evaluation if the symptoms worsen or if the condition fails to improve as anticipated.  I provided 15 minutes of non-face-to-face time during this encounter.  Elsie Stain, MD

## 2019-04-20 NOTE — Telephone Encounter (Signed)
Noted. Thanks.

## 2019-04-21 DIAGNOSIS — H2512 Age-related nuclear cataract, left eye: Secondary | ICD-10-CM | POA: Diagnosis not present

## 2019-04-21 NOTE — Assessment & Plan Note (Signed)
Discussed his pain medicine/pain level.  "My pain is tolerable with the pain medicine, like a 4-5 now and I'm tickled with that."  No sedation from med.  He takes a nap in the day but that is at baseline.   No change in meds at this time.  See above regarding non-dismissal.

## 2019-05-04 ENCOUNTER — Other Ambulatory Visit: Payer: Self-pay

## 2019-05-04 ENCOUNTER — Encounter: Payer: Self-pay | Admitting: *Deleted

## 2019-05-04 ENCOUNTER — Ambulatory Visit (INDEPENDENT_AMBULATORY_CARE_PROVIDER_SITE_OTHER): Payer: PPO

## 2019-05-04 DIAGNOSIS — Z23 Encounter for immunization: Secondary | ICD-10-CM

## 2019-05-06 ENCOUNTER — Other Ambulatory Visit
Admission: RE | Admit: 2019-05-06 | Discharge: 2019-05-06 | Disposition: A | Discharge status: Home / Self Care | Payer: PPO | Source: Ambulatory Visit | Attending: Ophthalmology | Admitting: Ophthalmology

## 2019-05-06 ENCOUNTER — Other Ambulatory Visit: Payer: Self-pay

## 2019-05-06 DIAGNOSIS — Z01812 Encounter for preprocedural laboratory examination: Secondary | ICD-10-CM | POA: Insufficient documentation

## 2019-05-06 DIAGNOSIS — Z20828 Contact with and (suspected) exposure to other viral communicable diseases: Secondary | ICD-10-CM | POA: Diagnosis not present

## 2019-05-06 LAB — SARS CORONAVIRUS 2 (TAT 6-24 HRS): SARS Coronavirus 2: NEGATIVE

## 2019-05-06 NOTE — Discharge Instructions (Signed)

## 2019-05-08 ENCOUNTER — Other Ambulatory Visit: Payer: Self-pay | Admitting: Family Medicine

## 2019-05-09 NOTE — Anesthesia Preprocedure Evaluation (Addendum)
Anesthesia Evaluation  Patient identified by MRN, date of birth, ID band Patient awake    Reviewed: Allergy & Precautions, NPO status , Patient's Chart, lab work & pertinent test results, reviewed documented beta blocker date and time   History of Anesthesia Complications (+) history of anesthetic complications (Post-operative cognitive dysfunction)  Airway Mallampati: II  TM Distance: >3 FB Neck ROM: Full    Dental  (+) Upper Dentures, Partial Lower   Pulmonary former smoker,    breath sounds clear to auscultation       Cardiovascular Exercise Tolerance: Good hypertension, (-) angina Rhythm:Regular Rate:Normal   HLD   Neuro/Psych PSYCHIATRIC DISORDERS (OCD) Anxiety  Neuromuscular disease (Radiculopathy, peripheral neuropathy) CVA    GI/Hepatic neg GERD  ,  Endo/Other  diabetes  Renal/GU Renal disease (Kidney stones)     Musculoskeletal  (+) Arthritis ,   Abdominal   Peds  Hematology   Anesthesia Other Findings   Reproductive/Obstetrics                            Anesthesia Physical Anesthesia Plan  ASA: III  Anesthesia Plan: MAC   Post-op Pain Management:    Induction: Intravenous  PONV Risk Score and Plan:   Airway Management Planned: Nasal Cannula  Additional Equipment:   Intra-op Plan:   Post-operative Plan:   Informed Consent: I have reviewed the patients History and Physical, chart, labs and discussed the procedure including the risks, benefits and alternatives for the proposed anesthesia with the patient or authorized representative who has indicated his/her understanding and acceptance.       Plan Discussed with: CRNA and Anesthesiologist  Anesthesia Plan Comments:         Anesthesia Quick Evaluation    Active Ambulatory Problems    Diagnosis Date Noted  . Uncontrolled type 2 diabetes mellitus with peripheral neuropathy ([place]) 05/26/2007  . HLD  (hyperlipidemia) 05/13/2008  . Essential hypertension 05/26/2007  . SCOLIOSIS 05/26/2007  . Tremor 05/26/2007  . Abdominal pain 08/11/2007  . COLONIC POLYPS, HX OF 05/26/2007  . LUMBAR RADICULOPATHY 10/16/2010  . Basal cell carcinoma   . Rhinitis, chronic 01/13/2012  . Diabetic polyneuropathy ([place]) 02/14/2014  . Anxiety state 10/27/2014  . Dysgeusia 11/28/2014  . Medicare annual wellness visit, initial 03/08/2015  . Advance care planning 03/08/2015  . Chronic back pain 04/12/2015  . Syncope 07/08/2015  . Muscle ache 10/05/2015  . Dysuria 03/29/2016  . Panic 01/10/2017  . Neck pain 05/14/2017  . Encounter for chronic pain management 06/18/2017  . Lightheaded 08/28/2017  . Paresthesia 12/03/2017  . Memory change 12/17/2017  . Health care maintenance 10/04/2018  . Thumb pain 10/04/2018   Resolved Ambulatory Problems    Diagnosis Date Noted  . SINUSITIS - ACUTE-NOS 12/12/2009  . CELLULITIS 03/17/2007  . BACK PAIN 08/08/2009  . FOOT PAIN, LEFT 05/22/2010  . FATIGUE 03/24/2009  . CHEST PAIN 07/25/2008  . DIABETES MELLITUS, TYPE II, UNCONTROLLED 09/07/2010  . MYALGIA 08/21/2010  . Syncope 07/23/2011  . Abdominal bloating 10/11/2011  . Chest wall pain 11/25/2011  . Cough 01/13/2012  . Cough, persistent 01/13/2012  . Bipolar disorder ([place]) 06/01/2012  . Dizziness 10/01/2012  . Other malaise and fatigue 01/11/2013  . Cough 09/17/2013  . Lung nodule 09/17/2013  . Acute non-recurrent maxillary sinusitis 05/29/2016  . ETD (eustachian tube dysfunction) 02/10/2017  . Fatigue 04/18/2017   Past Medical History:  Diagnosis Date  . Cataracts, both eyes 2019  .  Complication of anesthesia   . Deaf   . Diabetes mellitus   . DJD (degenerative joint disease)   . Hearing impaired   . History of blood transfusion   . History of colonic polyps   . Hyperlipidemia   . Hypertension   . Kidney stone 1967  . Nose fracture   . OCD (obsessive compulsive disorder)   . Stroke ([place])   .  Stuttering   . Tremor, essential   . Vertigo   . Wears dentures   . Wears glasses     CBC    Component Value Date/Time   WBC 5.9 09/28/2018 1039   RBC 4.87 09/28/2018 1039   HGB 14.7 09/28/2018 1039   HGB 14.9 09/13/2013 1525   HCT 44.1 09/28/2018 1039   HCT 44.8 09/13/2013 1525   PLT 196.0 09/28/2018 1039   PLT 184 09/13/2013 1525   MCV 90.5 09/28/2018 1039   MCV 92 09/13/2013 1525   MCH 30.3 02/13/2018 1520   MCHC 33.4 09/28/2018 1039   RDW 13.8 09/28/2018 1039   RDW 14.3 09/13/2013 1525   LYMPHSABS 1.3 09/28/2018 1039   MONOABS 0.5 09/28/2018 1039   EOSABS 0.1 09/28/2018 1039   BASOSABS 0.1 09/28/2018 1039    CMP     Component Value Date/Time   NA 141 09/28/2018 1039   NA 138 09/13/2013 1525   K 4.6 09/28/2018 1039   K 4.0 09/13/2013 1525   CL 103 09/28/2018 1039   CL 106 09/13/2013 1525   CO2 28 09/28/2018 1039   CO2 26 09/13/2013 1525   GLUCOSE 155 (H) 09/28/2018 1039   GLUCOSE 162 (H) 09/13/2013 1525   BUN 12 09/28/2018 1039   BUN 18 09/13/2013 1525   CREATININE 1.13 09/28/2018 1039   CREATININE 1.20 (H) 02/13/2018 1520   CALCIUM 9.1 09/28/2018 1039   CALCIUM 9.1 09/13/2013 1525   PROT 6.8 09/28/2018 1039   PROT 6.9 09/13/2013 1525   ALBUMIN 4.2 09/28/2018 1039   ALBUMIN 3.6 09/13/2013 1525   AST 18 09/28/2018 1039   AST 21 09/13/2013 1525   ALT 18 09/28/2018 1039   ALT 23 09/13/2013 1525   ALKPHOS 57 09/28/2018 1039   ALKPHOS 74 09/13/2013 1525   BILITOT 0.7 09/28/2018 1039   BILITOT 0.5 09/13/2013 1525   GFRNONAA 70 (L) 09/27/2014 1040   GFRNONAA >60 09/13/2013 1525   GFRAA 81 (L) 09/27/2014 1040   GFRAA >60 09/13/2013 1525    COAGS No results found for: INR, PTT  I have seen and consented the patient, Walter Harris. I have answered all of his questions regarding anesthesia. he is appropriately NPO.   Josephina Shih, MD Anesthesia

## 2019-05-10 ENCOUNTER — Ambulatory Visit: Payer: PPO | Admitting: Anesthesiology

## 2019-05-10 ENCOUNTER — Encounter: Payer: Self-pay | Admitting: Anesthesiology

## 2019-05-10 ENCOUNTER — Ambulatory Visit
Admission: RE | Admit: 2019-05-10 | Discharge: 2019-05-10 | Disposition: A | Discharge status: Home / Self Care | Payer: PPO | Attending: Ophthalmology | Admitting: Ophthalmology

## 2019-05-10 ENCOUNTER — Encounter
Admission: RE | Disposition: A | Discharge status: Home / Self Care | Payer: Self-pay | Source: Home / Self Care | Attending: Ophthalmology

## 2019-05-10 ENCOUNTER — Other Ambulatory Visit: Payer: Self-pay

## 2019-05-10 DIAGNOSIS — E1142 Type 2 diabetes mellitus with diabetic polyneuropathy: Secondary | ICD-10-CM | POA: Insufficient documentation

## 2019-05-10 DIAGNOSIS — Z87891 Personal history of nicotine dependence: Secondary | ICD-10-CM | POA: Insufficient documentation

## 2019-05-10 DIAGNOSIS — Z85828 Personal history of other malignant neoplasm of skin: Secondary | ICD-10-CM | POA: Diagnosis not present

## 2019-05-10 DIAGNOSIS — I10 Essential (primary) hypertension: Secondary | ICD-10-CM | POA: Diagnosis not present

## 2019-05-10 DIAGNOSIS — E1136 Type 2 diabetes mellitus with diabetic cataract: Secondary | ICD-10-CM | POA: Diagnosis not present

## 2019-05-10 DIAGNOSIS — F419 Anxiety disorder, unspecified: Secondary | ICD-10-CM | POA: Insufficient documentation

## 2019-05-10 DIAGNOSIS — H2512 Age-related nuclear cataract, left eye: Secondary | ICD-10-CM | POA: Diagnosis not present

## 2019-05-10 DIAGNOSIS — H25812 Combined forms of age-related cataract, left eye: Secondary | ICD-10-CM | POA: Diagnosis not present

## 2019-05-10 DIAGNOSIS — Z79899 Other long term (current) drug therapy: Secondary | ICD-10-CM | POA: Diagnosis not present

## 2019-05-10 DIAGNOSIS — H9190 Unspecified hearing loss, unspecified ear: Secondary | ICD-10-CM | POA: Insufficient documentation

## 2019-05-10 DIAGNOSIS — Z794 Long term (current) use of insulin: Secondary | ICD-10-CM | POA: Diagnosis not present

## 2019-05-10 HISTORY — PX: CATARACT EXTRACTION W/PHACO: SHX586

## 2019-05-10 LAB — GLUCOSE, CAPILLARY
Glucose-Capillary: 124 mg/dL — ABNORMAL HIGH (ref 70–99)
Glucose-Capillary: 144 mg/dL — ABNORMAL HIGH (ref 70–99)

## 2019-05-10 SURGERY — PHACOEMULSIFICATION, CATARACT, WITH IOL INSERTION
Anesthesia: Monitor Anesthesia Care | Site: Eye | Laterality: Left

## 2019-05-10 MED ORDER — MOXIFLOXACIN HCL 0.5 % OP SOLN
OPHTHALMIC | Status: DC | PRN
  Administered 2019-05-10: 0.2 mL via OPHTHALMIC

## 2019-05-10 MED ORDER — FENTANYL CITRATE (PF) 100 MCG/2ML IJ SOLN
INTRAMUSCULAR | Status: DC | PRN
  Administered 2019-05-10 (×2): 50 ug via INTRAVENOUS

## 2019-05-10 MED ORDER — SODIUM HYALURONATE 10 MG/ML IO SOLN
INTRAOCULAR | Status: DC | PRN
  Administered 2019-05-10: 0.55 mL via INTRAOCULAR

## 2019-05-10 MED ORDER — SODIUM HYALURONATE 23 MG/ML IO SOLN
INTRAOCULAR | Status: DC | PRN
  Administered 2019-05-10: 0.6 mL via INTRAOCULAR

## 2019-05-10 MED ORDER — LIDOCAINE HCL (PF) 2 % IJ SOLN
INTRAOCULAR | Status: DC | PRN
  Administered 2019-05-10: 1 mL via INTRAOCULAR

## 2019-05-10 MED ORDER — MIDAZOLAM HCL 2 MG/2ML IJ SOLN
INTRAMUSCULAR | Status: DC | PRN
  Administered 2019-05-10 (×2): 1 mg via INTRAVENOUS

## 2019-05-10 MED ORDER — ARMC OPHTHALMIC DILATING DROPS
1.0000 "application " | OPHTHALMIC | Status: DC | PRN
  Administered 2019-05-10 (×3): 1 via OPHTHALMIC

## 2019-05-10 MED ORDER — EPINEPHRINE PF 1 MG/ML IJ SOLN
INTRAOCULAR | Status: DC | PRN
  Administered 2019-05-10: 09:00:00 73 mL via OPHTHALMIC

## 2019-05-10 MED ORDER — TETRACAINE HCL 0.5 % OP SOLN
1.0000 [drp] | OPHTHALMIC | Status: DC | PRN
  Administered 2019-05-10 (×3): 1 [drp] via OPHTHALMIC

## 2019-05-10 SURGICAL SUPPLY — 19 items
CANNULA ANT/CHMB 27G (MISCELLANEOUS) ×2 IMPLANT
CANNULA ANT/CHMB 27GA (MISCELLANEOUS) ×6 IMPLANT
DISSECTOR HYDRO NUCLEUS 50X22 (MISCELLANEOUS) ×3 IMPLANT
GLOVE SURG LX 7.5 STRW (GLOVE) ×2
GLOVE SURG LX STRL 7.5 STRW (GLOVE) ×1 IMPLANT
GLOVE SURG SYN 8.5  E (GLOVE) ×2
GLOVE SURG SYN 8.5 E (GLOVE) ×1 IMPLANT
GLOVE SURG SYN 8.5 PF PI (GLOVE) ×1 IMPLANT
GOWN STRL REUS W/ TWL LRG LVL3 (GOWN DISPOSABLE) ×2 IMPLANT
GOWN STRL REUS W/TWL LRG LVL3 (GOWN DISPOSABLE) ×4
LENS IOL TECNIS ITEC 20.0 (Intraocular Lens) ×2 IMPLANT
MARKER SKIN DUAL TIP RULER LAB (MISCELLANEOUS) ×3 IMPLANT
PACK DR. KING ARMS (PACKS) ×3 IMPLANT
PACK EYE AFTER SURG (MISCELLANEOUS) ×3 IMPLANT
PACK OPTHALMIC (MISCELLANEOUS) ×3 IMPLANT
SYR 3ML LL SCALE MARK (SYRINGE) ×3 IMPLANT
SYR TB 1ML LUER SLIP (SYRINGE) ×3 IMPLANT
WATER STERILE IRR 250ML POUR (IV SOLUTION) ×3 IMPLANT
WIPE NON LINTING 3.25X3.25 (MISCELLANEOUS) ×3 IMPLANT

## 2019-05-10 NOTE — H&P (Signed)

## 2019-05-10 NOTE — Anesthesia Procedure Notes (Signed)
Procedure Name: MAC Date/Time: 05/10/2019 8:25 AM Performed by: Georga Bora, CRNA Pre-anesthesia Checklist: Patient identified, Emergency Drugs available, Suction available, Patient being monitored and Timeout performed Patient Re-evaluated:Patient Re-evaluated prior to induction Oxygen Delivery Method: Nasal cannula

## 2019-05-10 NOTE — Transfer of Care (Signed)
Immediate Anesthesia Transfer of Care Note  Patient: Walter Harris  Procedure(s) Performed: CATARACT EXTRACTION PHACO AND INTRAOCULAR LENS PLACEMENT (IOC)  LEFT DIABETIC  00:25.6  14.4%  3.75 (Left Eye)  Patient Location: PACU  Anesthesia Type: MAC  Level of Consciousness: awake, alert  and patient cooperative  Airway and Oxygen Therapy: Patient Spontanous Breathing and Patient connected to supplemental oxygen  Post-op Assessment: Post-op Vital signs reviewed, Patient's Cardiovascular Status Stable, Respiratory Function Stable, Patent Airway and No signs of Nausea or vomiting  Post-op Vital Signs: Reviewed and stable  Complications: No apparent anesthesia complications

## 2019-05-10 NOTE — Anesthesia Postprocedure Evaluation (Signed)
Anesthesia Post Note  Patient: Walter Harris  Procedure(s) Performed: CATARACT EXTRACTION PHACO AND INTRAOCULAR LENS PLACEMENT (IOC)  LEFT DIABETIC  00:25.6  14.4%  3.75 (Left Eye)  Patient location during evaluation: PACU Anesthesia Type: MAC Level of consciousness: awake and alert Pain management: pain level controlled Vital Signs Assessment: post-procedure vital signs reviewed and stable Respiratory status: spontaneous breathing, nonlabored ventilation, respiratory function stable and patient connected to nasal cannula oxygen Cardiovascular status: stable and blood pressure returned to baseline Postop Assessment: no apparent nausea or vomiting Anesthetic complications: no    Reizel Calzada A  Sameen Leas

## 2019-05-10 NOTE — Op Note (Signed)
OPERATIVE NOTE  Walter Harris [id number] 05/10/2019   PREOPERATIVE DIAGNOSIS:  Nuclear sclerotic cataract left eye.  H25.12   POSTOPERATIVE DIAGNOSIS:    Nuclear sclerotic cataract left eye.     PROCEDURE:  Phacoemusification with posterior chamber intraocular lens placement of the left eye   LENS:   Implant Name Type Inv. Item Serial No. Manufacturer Lot No. LRB No. Used Action  LENS IOL DIOP 20.0 - [id number] Intraocular Lens LENS IOL DIOP 20.0 [id number]  Left 1 Implanted       PCB00 +20.0   ULTRASOUND TIME: 0 minutes 25 seconds.  CDE 3.75   SURGEON:  Benay Pillow, MD, MPH   ANESTHESIA:  Topical with tetracaine drops augmented with 1% preservative-free intracameral lidocaine.  ESTIMATED BLOOD LOSS: <1 mL   COMPLICATIONS:  None.   DESCRIPTION OF PROCEDURE:  The patient was identified in the holding room and transported to the operating room and placed in the supine position under the operating microscope.  The left eye was identified as the operative eye and it was prepped and draped in the usual sterile ophthalmic fashion.   A 1.0 millimeter clear-corneal paracentesis was made at the 5:00 position. 0.5 ml of preservative-free 1% lidocaine with epinephrine was injected into the anterior chamber.  The anterior chamber was filled with Healon 5 viscoelastic.  A 2.4 millimeter keratome was used to make a near-clear corneal incision at the 2:00 position.  A curvilinear capsulorrhexis was made with a cystotome and capsulorrhexis forceps.  Balanced salt solution was used to hydrodissect and hydrodelineate the nucleus.   Phacoemulsification was then used in stop and chop fashion to remove the lens nucleus and epinucleus.  The remaining cortex was then removed using the irrigation and aspiration handpiece. Healon was then placed into the capsular bag to distend it for lens placement.  A lens was then injected into the capsular bag.  The remaining viscoelastic was aspirated.   Wounds  were hydrated with balanced salt solution.  The anterior chamber was inflated to a physiologic pressure with balanced salt solution.  Intracameral vigamox 0.1 mL undiltued was injected into the eye and a drop placed onto the ocular surface.  No wound leaks were noted.  The patient was taken to the recovery room in stable condition without complications of anesthesia or surgery  Benay Pillow 05/10/2019, 8:41 AM

## 2019-05-10 NOTE — Telephone Encounter (Signed)
Last filled on 04/06/2019 for #60 with 1 refill. Pharmacy sending this refill request to have on file for next fill. Ok to proceed? LOV was on 04/19/2019

## 2019-05-11 NOTE — Telephone Encounter (Signed)
Sent. Thanks.   

## 2019-05-21 ENCOUNTER — Other Ambulatory Visit: Payer: Self-pay | Admitting: Family Medicine

## 2019-05-21 NOTE — Telephone Encounter (Signed)
Electronic refill request.  oxyCODONE-acetaminophen (PERCOCET/ROXICET) 5-325 MG tablet        Sig: TAKE ONE TO TWO TABLETS BY MOUTH TWICE DAILY IF NEEDED FOR PAIN   Disp:  120 tablet  Refills:  0   Start: 05/21/2019   Earliest Fill Date: 05/21/2019   Last office visit:   04/19/2019 Please advise.

## 2019-05-23 MED ORDER — OXYCODONE-ACETAMINOPHEN 5-325 MG PO TABS: ORAL_TABLET | ORAL | 0 refills | Status: DC

## 2019-05-23 NOTE — Telephone Encounter (Signed)
Sent. Thanks.   

## 2019-05-25 DIAGNOSIS — E1169 Type 2 diabetes mellitus with other specified complication: Secondary | ICD-10-CM | POA: Diagnosis not present

## 2019-05-25 DIAGNOSIS — Z794 Long term (current) use of insulin: Secondary | ICD-10-CM | POA: Diagnosis not present

## 2019-05-25 DIAGNOSIS — E1165 Type 2 diabetes mellitus with hyperglycemia: Secondary | ICD-10-CM | POA: Diagnosis not present

## 2019-05-25 DIAGNOSIS — E785 Hyperlipidemia, unspecified: Secondary | ICD-10-CM | POA: Diagnosis not present

## 2019-05-26 DIAGNOSIS — X32XXXA Exposure to sunlight, initial encounter: Secondary | ICD-10-CM | POA: Diagnosis not present

## 2019-05-26 DIAGNOSIS — L821 Other seborrheic keratosis: Secondary | ICD-10-CM | POA: Diagnosis not present

## 2019-05-26 DIAGNOSIS — Z85828 Personal history of other malignant neoplasm of skin: Secondary | ICD-10-CM | POA: Diagnosis not present

## 2019-05-26 DIAGNOSIS — L570 Actinic keratosis: Secondary | ICD-10-CM | POA: Diagnosis not present

## 2019-05-26 DIAGNOSIS — Z08 Encounter for follow-up examination after completed treatment for malignant neoplasm: Secondary | ICD-10-CM | POA: Diagnosis not present

## 2019-06-22 ENCOUNTER — Other Ambulatory Visit: Payer: Self-pay | Admitting: Family Medicine

## 2019-06-22 NOTE — Telephone Encounter (Signed)
Name of Medication:Oxycodone  Name of Pharmacy: [facility] or Written Date and Quantity: 05/23/19 #120 Last Office Visit and Type: 04/2019 Next Office Visit and Type: 10/05/19 [facility]

## 2019-06-23 MED ORDER — OXYCODONE-ACETAMINOPHEN 5-325 MG PO TABS: ORAL_TABLET | ORAL | 0 refills | Status: DC

## 2019-06-23 MED ORDER — PROPRANOLOL HCL ER 60 MG PO CP24: 60.0000 mg | ORAL_CAPSULE | Freq: Every day | ORAL | 0 refills | Status: DC

## 2019-06-23 NOTE — Telephone Encounter (Signed)
Sent. Thanks.   

## 2019-07-19 ENCOUNTER — Other Ambulatory Visit: Payer: Self-pay | Admitting: Family Medicine

## 2019-07-19 NOTE — Telephone Encounter (Signed)
Name of Medication: Oxycodone Name of Pharmacy: [facility] or Written Date and Quantity:  120 tablet 0 06/23/2019  Last Office Visit and Type: 04/19/2019 Next Office Visit and Type: 10/05/2019 Last Controlled Substance Agreement Date: 02/28/2015 Last UDS: 03/14/2016

## 2019-07-20 MED ORDER — OXYCODONE-ACETAMINOPHEN 5-325 MG PO TABS: ORAL_TABLET | ORAL | 0 refills | Status: DC

## 2019-07-20 NOTE — Telephone Encounter (Signed)
Sent. Thanks.   

## 2019-08-13 ENCOUNTER — Other Ambulatory Visit: Payer: Self-pay | Admitting: Family Medicine

## 2019-08-13 NOTE — Telephone Encounter (Signed)
Electronic refill request. Alprazolam Last office visit:   04/19/2019 Last Filled:    60 tablet 1 05/11/2019  Please advise.

## 2019-08-15 NOTE — Telephone Encounter (Signed)
Sent. Thanks.   

## 2019-08-23 ENCOUNTER — Other Ambulatory Visit: Payer: Self-pay | Admitting: Family Medicine

## 2019-08-23 NOTE — Telephone Encounter (Signed)
Name of Medication: Oxycodone Name of Pharmacy: [facility] or Written Date and Quantity:  120 tablet 0 07/20/2019  Last Office Visit and Type:  04/19/2019 Next Office Visit and Type: 10/05/2019 Last Controlled Substance Agreement Date: 02/28/2015 Last UDS: 03/14/2016

## 2019-08-24 MED ORDER — OXYCODONE-ACETAMINOPHEN 5-325 MG PO TABS: ORAL_TABLET | ORAL | 0 refills | Status: DC

## 2019-08-24 NOTE — Telephone Encounter (Signed)
Please call patient and schedule appointment as instructed. 

## 2019-08-24 NOTE — Telephone Encounter (Signed)
Sent. Thanks.  Needs f/u pain visit next month.

## 2019-08-28 ENCOUNTER — Other Ambulatory Visit: Payer: Self-pay | Admitting: Family Medicine

## 2019-08-28 ENCOUNTER — Other Ambulatory Visit: Payer: Self-pay | Admitting: Internal Medicine

## 2019-08-30 NOTE — Telephone Encounter (Signed)
RX sent with appointment reminder.

## 2019-08-30 NOTE — Telephone Encounter (Signed)
OK to refill for 3 mo >> needs appt within these 3 mo

## 2019-08-30 NOTE — Telephone Encounter (Signed)
Last office visit 12/03/17  Cancel/No-show? one  Future office visit scheduled? no  Please advise on refill.

## 2019-09-16 ENCOUNTER — Ambulatory Visit (INDEPENDENT_AMBULATORY_CARE_PROVIDER_SITE_OTHER): Payer: PPO | Admitting: Family Medicine

## 2019-09-16 DIAGNOSIS — G8929 Other chronic pain: Secondary | ICD-10-CM

## 2019-09-16 DIAGNOSIS — F411 Generalized anxiety disorder: Secondary | ICD-10-CM

## 2019-09-16 DIAGNOSIS — M549 Dorsalgia, unspecified: Secondary | ICD-10-CM

## 2019-09-16 MED ORDER — OXYCODONE-ACETAMINOPHEN 5-325 MG PO TABS: ORAL_TABLET | ORAL | Status: DC

## 2019-09-16 MED ORDER — FLUTICASONE PROPIONATE 50 MCG/ACT NA SUSP: NASAL | Status: DC

## 2019-09-16 MED ORDER — LANTUS SOLOSTAR 100 UNIT/ML ~~LOC~~ SOPN: PEN_INJECTOR | SUBCUTANEOUS | Status: DC

## 2019-09-16 MED ORDER — TRAZODONE HCL 50 MG PO TABS: ORAL_TABLET | ORAL | Status: DC

## 2019-09-16 NOTE — Progress Notes (Signed)
Interactive audio and video telecommunications were attempted between this provider and patient, however failed, due to patient having technical difficulties OR patient did not have access to video capability.  We continued and completed visit with audio only.   Virtual Visit via Telephone Note  I connected with patient on 09/16/19  at 2:11 PM  by telephone and verified that I am speaking with the correct person using two identifiers.  Location of patient: home  Location of MD: [place] Name of referring provider (if blank then none associated): Names per persons and role in encounter:  MD: Earlyne Iba, Patient: name listed above.    I discussed the limitations, risks, security and privacy concerns of performing an evaluation and management service by telephone and the availability of in person appointments. I also discussed with the patient that there may be a patient responsible charge related to this service. The patient expressed understanding and agreed to proceed.  CC: Follow-up  History of Present Illness:   He has been on 30 units of insulin recently.   DM2 per endo.  I'll defer.  He agrees.  Usually his sugar has been ~100-120, was 126 this AM.    He can check BP at home and then update me.    Pandemic considerations d/w pt.  Social stressors d/w pt.  using xanax with some relief.  No adverse effect on medication  Indication for chronic opioid: Chronic neck and back pain Medication and dose: oxycodone 5/325mg  # pills per month: 180 Last UDS date: 03/14/16 Pain contract signed (Y/N): yes Date narcotic database last reviewed (include red flags):09/16/19   Pain inventory (1-10) Average pain: 5/10 is tolerable for patient.  Pain now: 6/10 now, with inc in neck pain recently.   My pain is "constant" with pain meds.  Pain is worse with activity.   Relief from meds: yes but limited duration from dose   In the last 24 hours, how much has pain interfered with the  following (1-10 greatest interference)? General activity- sig limitation w/o pain meds.   Relationships with others- no changes recently.  Enjoyment of life- no changes recently.  What time of the day is the pain the worst- at night and with movement Sleep is described by patient as- 5-6 hours at night, better with trazodone.  Some nights are better than others.   Mobility/function Assistance device: using a walker prn.  How many minutes can you walk: up to 20-30 minutes at a time.   Able to climb steps:yes Driving:yes Disabled:no  Bowel or bladder symptoms: no Mood: at baseline. No changes  Physicians involved in care: still seeing endocrine, ortho.    Tizanidine clearly helped.  He is taking 3 a day on most days.  D/w pt about taking 4th tab per day.  No sedation.    Observations/Objective:nad Speech wnl.    Assessment and Plan: Anxiety.  Will refill xanax, no adverse effect on medication.  Used with relief.  Not sedated.  Chronic pain.  Continue tizanidine as that has helped, would inc oxycodone to 2 tabs TID prn.  Continue bowel regimen with miralax prn.  He agrees.  He will update me about how he is feeling with his new regimen.  Sedation caution on medications.  He can check his blood pressure at home and then update me.  It is likely that increased pain could contribute to blood pressure elevation.  Discussed.  Follow Up Instructions: see above.    I discussed the assessment  and treatment plan with the patient. The patient was provided an opportunity to ask questions and all were answered. The patient agreed with the plan and demonstrated an understanding of the instructions.   The patient was advised to call back or seek an in-person evaluation if the symptoms worsen or if the condition fails to improve as anticipated.  I provided 24 minutes of non-face-to-face time during this encounter.  Elsie Stain, MD

## 2019-09-17 ENCOUNTER — Encounter: Payer: Self-pay | Admitting: Family Medicine

## 2019-09-17 MED ORDER — ALPRAZOLAM 0.5 MG PO TABS: ORAL_TABLET | ORAL | 1 refills | Status: DC

## 2019-09-17 MED ORDER — OXYCODONE-ACETAMINOPHEN 5-325 MG PO TABS: ORAL_TABLET | ORAL | 0 refills | Status: DC

## 2019-09-19 NOTE — Assessment & Plan Note (Signed)
Will refill xanax, no adverse effect on medication.  Used with relief.  Not sedated.

## 2019-09-19 NOTE — Assessment & Plan Note (Signed)
Chronic pain.  Continue tizanidine as that has helped, would inc oxycodone to 2 tabs TID prn.  Continue bowel regimen with miralax prn.  He agrees.  He will update me about how he is feeling with his new regimen.  Sedation caution on medications.  He can check his blood pressure at home and then update me.  It is likely that increased pain could contribute to blood pressure elevation.  Discussed.

## 2019-09-24 ENCOUNTER — Other Ambulatory Visit: Payer: Self-pay | Admitting: Family Medicine

## 2019-09-26 ENCOUNTER — Other Ambulatory Visit: Payer: Self-pay | Admitting: Family Medicine

## 2019-09-26 DIAGNOSIS — R251 Tremor, unspecified: Secondary | ICD-10-CM

## 2019-09-26 DIAGNOSIS — IMO0002 Reserved for concepts with insufficient information to code with codable children: Secondary | ICD-10-CM

## 2019-09-26 DIAGNOSIS — E1142 Type 2 diabetes mellitus with diabetic polyneuropathy: Secondary | ICD-10-CM

## 2019-09-29 DIAGNOSIS — Z794 Long term (current) use of insulin: Secondary | ICD-10-CM | POA: Diagnosis not present

## 2019-09-29 DIAGNOSIS — E785 Hyperlipidemia, unspecified: Secondary | ICD-10-CM | POA: Diagnosis not present

## 2019-09-29 DIAGNOSIS — E1169 Type 2 diabetes mellitus with other specified complication: Secondary | ICD-10-CM | POA: Diagnosis not present

## 2019-09-29 DIAGNOSIS — E1165 Type 2 diabetes mellitus with hyperglycemia: Secondary | ICD-10-CM | POA: Diagnosis not present

## 2019-10-01 ENCOUNTER — Other Ambulatory Visit (INDEPENDENT_AMBULATORY_CARE_PROVIDER_SITE_OTHER): Payer: PPO

## 2019-10-01 ENCOUNTER — Other Ambulatory Visit: Payer: Self-pay

## 2019-10-01 ENCOUNTER — Ambulatory Visit (INDEPENDENT_AMBULATORY_CARE_PROVIDER_SITE_OTHER): Payer: PPO

## 2019-10-01 DIAGNOSIS — E1165 Type 2 diabetes mellitus with hyperglycemia: Secondary | ICD-10-CM

## 2019-10-01 DIAGNOSIS — IMO0002 Reserved for concepts with insufficient information to code with codable children: Secondary | ICD-10-CM

## 2019-10-01 DIAGNOSIS — Z0000 Encounter for general adult medical examination without abnormal findings: Secondary | ICD-10-CM

## 2019-10-01 DIAGNOSIS — E1142 Type 2 diabetes mellitus with diabetic polyneuropathy: Secondary | ICD-10-CM

## 2019-10-01 LAB — CBC WITH DIFFERENTIAL/PLATELET
Basophils Absolute: 0.1 10*3/uL (ref 0.0–0.1)
Basophils Relative: 0.9 % (ref 0.0–3.0)
Eosinophils Absolute: 0.2 10*3/uL (ref 0.0–0.7)
Eosinophils Relative: 2.5 % (ref 0.0–5.0)
HCT: 44.1 % (ref 39.0–52.0)
Hemoglobin: 14.5 g/dL (ref 13.0–17.0)
Lymphocytes Relative: 18.2 % (ref 12.0–46.0)
Lymphs Abs: 1.1 10*3/uL (ref 0.7–4.0)
MCHC: 32.9 g/dL (ref 30.0–36.0)
MCV: 92.7 fl (ref 78.0–100.0)
Monocytes Absolute: 0.6 10*3/uL (ref 0.1–1.0)
Monocytes Relative: 9.1 % (ref 3.0–12.0)
Neutro Abs: 4.3 10*3/uL (ref 1.4–7.7)
Neutrophils Relative %: 69.3 % (ref 43.0–77.0)
Platelets: 212 10*3/uL (ref 150.0–400.0)
RBC: 4.76 Mil/uL (ref 4.22–5.81)
RDW: 13.1 % (ref 11.5–15.5)
WBC: 6.2 10*3/uL (ref 4.0–10.5)

## 2019-10-01 NOTE — Patient Instructions (Signed)
Walter Harris , Thank you for taking time to come for your Medicare Wellness Visit. I appreciate your ongoing commitment to your health goals. Please review the following plan we discussed and let me know if I can assist you in the future.   Screening recommendations/referrals: Colonoscopy: Up to date, completed 09/09/2012 Recommended yearly ophthalmology/optometry visit for glaucoma screening and checkup Recommended yearly dental visit for hygiene and checkup  Vaccinations: Influenza vaccine: Up to date, completed 05/04/2019 Pneumococcal vaccine: Completed series Tdap vaccine: Up to date, completed 12/13/2016 Shingles vaccine: discussed    Advanced directives: Please bring a copy of your POA (Power of [place]) and/or Living Will to your next appointment.  Conditions/risks identified: diabetes, hypertension, hyperlipidemia  Next appointment: 10/05/2019 @ 10:45 am   Preventive Care 65 Years and Older, Male Preventive care refers to lifestyle choices and visits with your health care provider that can promote health and wellness. What does preventive care include?  A yearly physical exam. This is also called an annual well check.  Dental exams once or twice a year.  Routine eye exams. Ask your health care provider how often you should have your eyes checked.  Personal lifestyle choices, including:  Daily care of your teeth and gums.  Regular physical activity.  Eating a healthy diet.  Avoiding tobacco and drug use.  Limiting alcohol use.  Practicing safe sex.  Taking low doses of aspirin every day.  Taking vitamin and mineral supplements as recommended by your health care provider. What happens during an annual well check? The services and screenings done by your health care provider during your annual well check will depend on your age, overall health, lifestyle risk factors, and family history of disease. Counseling  Your health care provider may ask you questions about  your:  Alcohol use.  Tobacco use.  Drug use.  Emotional well-being.  Home and relationship well-being.  Sexual activity.  Eating habits.  History of falls.  Memory and ability to understand (cognition).  Work and work Statistician. Screening  You may have the following tests or measurements:  Height, weight, and BMI.  Blood pressure.  Lipid and cholesterol levels. These may be checked every 5 years, or more frequently if you are over 37 years old.  Skin check.  Lung cancer screening. You may have this screening every year starting at age 64 if you have a 30-pack-year history of smoking and currently smoke or have quit within the past 15 years.  Fecal occult blood test (FOBT) of the stool. You may have this test every year starting at age 85.  Flexible sigmoidoscopy or colonoscopy. You may have a sigmoidoscopy every 5 years or a colonoscopy every 10 years starting at age 26.  Prostate cancer screening. Recommendations will vary depending on your family history and other risks.  Hepatitis C blood test.  Hepatitis B blood test.  Sexually transmitted disease (STD) testing.  Diabetes screening. This is done by checking your blood sugar (glucose) after you have not eaten for a while (fasting). You may have this done every 1-3 years.  Abdominal aortic aneurysm (AAA) screening. You may need this if you are a current or former smoker.  Osteoporosis. You may be screened starting at age 92 if you are at high risk. Talk with your health care provider about your test results, treatment options, and if necessary, the need for more tests. Vaccines  Your health care provider may recommend certain vaccines, such as:  Influenza vaccine. This is recommended every year.  Tetanus, diphtheria, and acellular pertussis (Tdap, Td) vaccine. You may need a Td booster every 10 years.  Zoster vaccine. You may need this after age 3.  Pneumococcal 13-valent conjugate (PCV13) vaccine.  One dose is recommended after age 82.  Pneumococcal polysaccharide (PPSV23) vaccine. One dose is recommended after age 60. Talk to your health care provider about which screenings and vaccines you need and how often you need them. This information is not intended to replace advice given to you by your health care provider. Make sure you discuss any questions you have with your health care provider. Document Released: 09/22/2015 Document Revised: 05/15/2016 Document Reviewed: 06/27/2015 Elsevier Interactive Patient Education  2017 [place] Prevention in the Home Falls can cause injuries. They can happen to people of all ages. There are many things you can do to make your home safe and to help prevent falls. What can I do on the outside of my home?  Regularly fix the edges of walkways and driveways and fix any cracks.  Remove anything that might make you trip as you walk through a door, such as a raised step or threshold.  Trim any bushes or trees on the path to your home.  Use bright outdoor lighting.  Clear any walking paths of anything that might make someone trip, such as rocks or tools.  Regularly check to see if handrails are loose or broken. Make sure that both sides of any steps have handrails.  Any raised decks and porches should have guardrails on the edges.  Have any leaves, snow, or ice cleared regularly.  Use sand or salt on walking paths during winter.  Clean up any spills in your garage right away. This includes oil or grease spills. What can I do in the bathroom?  Use night lights.  Install grab bars by the toilet and in the tub and shower. Do not use towel bars as grab bars.  Use non-skid mats or decals in the tub or shower.  If you need to sit down in the shower, use a plastic, non-slip stool.  Keep the floor dry. Clean up any water that spills on the floor as soon as it happens.  Remove soap buildup in the tub or shower regularly.  Attach bath  mats securely with double-sided non-slip rug tape.  Do not have throw rugs and other things on the floor that can make you trip. What can I do in the bedroom?  Use night lights.  Make sure that you have a light by your bed that is easy to reach.  Do not use any sheets or blankets that are too big for your bed. They should not hang down onto the floor.  Have a firm chair that has side arms. You can use this for support while you get dressed.  Do not have throw rugs and other things on the floor that can make you trip. What can I do in the kitchen?  Clean up any spills right away.  Avoid walking on wet floors.  Keep items that you use a lot in easy-to-reach places.  If you need to reach something above you, use a strong step stool that has a grab bar.  Keep electrical cords out of the way.  Do not use floor polish or wax that makes floors slippery. If you must use wax, use non-skid floor wax.  Do not have throw rugs and other things on the floor that can make you trip. What can I do  with my stairs?  Do not leave any items on the stairs.  Make sure that there are handrails on both sides of the stairs and use them. Fix handrails that are broken or loose. Make sure that handrails are as long as the stairways.  Check any carpeting to make sure that it is firmly attached to the stairs. Fix any carpet that is loose or worn.  Avoid having throw rugs at the top or bottom of the stairs. If you do have throw rugs, attach them to the floor with carpet tape.  Make sure that you have a light switch at the top of the stairs and the bottom of the stairs. If you do not have them, ask someone to add them for you. What else can I do to help prevent falls?  Wear shoes that:  Do not have high heels.  Have rubber bottoms.  Are comfortable and fit you well.  Are closed at the toe. Do not wear sandals.  If you use a stepladder:  Make sure that it is fully opened. Do not climb a closed  stepladder.  Make sure that both sides of the stepladder are locked into place.  Ask someone to hold it for you, if possible.  Clearly mark and make sure that you can see:  Any grab bars or handrails.  First and last steps.  Where the edge of each step is.  Use tools that help you move around (mobility aids) if they are needed. These include:  Canes.  Walkers.  Scooters.  Crutches.  Turn on the lights when you go into a dark area. Replace any light bulbs as soon as they burn out.  Set up your furniture so you have a clear path. Avoid moving your furniture around.  If any of your floors are uneven, fix them.  If there are any pets around you, be aware of where they are.  Review your medicines with your doctor. Some medicines can make you feel dizzy. This can increase your chance of falling. Ask your doctor what other things that you can do to help prevent falls. This information is not intended to replace advice given to you by your health care provider. Make sure you discuss any questions you have with your health care provider. Document Released: 06/22/2009 Document Revised: 02/01/2016 Document Reviewed: 09/30/2014 Elsevier Interactive Patient Education  2017 [employer].

## 2019-10-01 NOTE — Progress Notes (Signed)
PCP notes:  Health Maintenance: Needs foot exam    Abnormal Screenings: none   Patient concerns: none   Nurse concerns: none   Next PCP appt.: 10/05/2019 @ 10:45 am

## 2019-10-01 NOTE — Progress Notes (Signed)
Subjective:   ROCK RIZK is a 74 y.o. male who presents for Medicare Annual/Subsequent preventive examination.  Review of Systems: N/A   This visit is being conducted through telemedicine via telephone at the nurse health advisor's home address due to the COVID-19 pandemic. This patient has given me verbal consent via doximity to conduct this visit, patient states they are participating from their home address. Patient and myself are on the telephone call. There is no referral for this visit. Some vital signs may be absent or patient reported.    Patient identification: identified by name, DOB, and current address   Cardiac Risk Factors include: advanced age (>3men, >73 women);diabetes mellitus;hypertension;dyslipidemia;male gender     Objective:    Vitals: There were no vitals taken for this visit.  There is no height or weight on file to calculate BMI.  Advanced Directives 10/01/2019 05/10/2019 04/12/2019 09/28/2018 09/25/2017 03/14/2016 09/27/2014  Does Patient Have a Medical Advance Directive? Yes Yes Yes Yes Yes Yes Yes  Type of Paramedic of [place];Living will [place];Living will [place];Living will [place];Living will [place];Living will [place];Living will [place];Living will  Does patient want to make changes to medical advance directive? - No - Guardian declined No - Patient declined - - No - Patient declined No - Patient declined  Copy of [place] in Chart? No - copy requested No - copy requested No - copy requested Yes - validated most recent copy scanned in chart (See row information) No - copy requested No - copy requested No - copy requested  Would patient like information on creating a medical advance directive? - - No - Patient declined - - - -    Tobacco Social History   Tobacco Use  Smoking Status  Former Smoker  . Packs/day: 0.50  . Years: 40.00  . Pack years: 20.00  . Types: Pipe  . Quit date: 06/09/2016  . Years since quitting: 3.3  Smokeless Tobacco Never Used     Counseling given: Not Answered   Clinical Intake:  Pre-visit preparation completed: Yes  Pain : 0-10 Pain Score: 5  Pain Type: Chronic pain Pain Location: (all over body) Pain Descriptors / Indicators: Aching Pain Onset: More than a month ago Pain Frequency: Constant     Nutritional Risks: None Diabetes: Yes CBG done?: No Did pt. bring in CBG monitor from home?: No  How often do you need to have someone help you when you read instructions, pamphlets, or other written materials from your doctor or pharmacy?: 1 - Never What is the last grade level you completed in school?: college graduate  Interpreter Needed?: No  Information entered by :: CJohnson, LPN  Past Medical History:  Diagnosis Date  . Basal cell carcinoma    multiple  . Cataracts, both eyes 2019   surgery pending April 2020  . Complication of anesthesia    post-operative cognitive dysfunction after ACDF on 05/01/11 [facility])  . Deaf    right ear  . Diabetes mellitus   . DJD (degenerative joint disease)    lumbar spine  . Hearing impaired    hearing aids-transmitter rt   . History of blood transfusion    as a baby in 25  . History of colonic polyps   . Hyperlipidemia   . Hypertension    pt. denies high blood pressure  . Kidney stone 1967  .  Memory change 12/17/2017  . Nose fracture    3x  . OCD (obsessive compulsive disorder)   . Stroke [facility])    "mini stroke, that's what gave me the tremors"  . Stuttering   . Tremor, essential   . Vertigo    random  . Wears dentures    full upper  . Wears glasses    Past Surgical History:  Procedure Laterality Date  . APPENDECTOMY  1960  . BACK SURGERY  2012   lumbar surgery  . CATARACT EXTRACTION W/PHACO Right 04/12/2019   Procedure: CATARACT EXTRACTION PHACO AND INTRAOCULAR LENS  PLACEMENT (IOC) RIGHT, DIABETIC;  Surgeon: [name], MD;  Location: [facility];  Service: Ophthalmology;  Laterality: Right;  Diabetic - insulin and oral meds  . CATARACT EXTRACTION W/PHACO Left 05/10/2019   Procedure: CATARACT EXTRACTION PHACO AND INTRAOCULAR LENS PLACEMENT (IOC)  LEFT DIABETIC  00:25.6  14.4%  3.75;  Surgeon: [name], MD;  Location: [facility];  Service: Ophthalmology;  Laterality: Left;  Diabetic - insulin and oral meds  . CERVICAL FUSION  1990  . CERVICAL FUSION  8/12   multiple levels with plates  . CHOLESTEATOMA EXCISION  1993  . COLLATERAL LIGAMENT REPAIR, ELBOW     bilateral, nerve improvement left 08/04, right 09/04  . COLONOSCOPY    . CYST EXCISION     back of cervical spine total of 5 surgeries  . EXTERNAL EAR SURGERY     multiple ear surguries as a child  . OTHER SURGICAL HISTORY     benign head tumor#5  . OTHER SURGICAL HISTORY     sebaceous cysts-post neck x5  . SHOULDER ARTHROSCOPY W/ ACROMIAL REPAIR  08/2004   left shoulder  . TONSILLECTOMY    . ULNAR NERVE TRANSPOSITION Right 05/13/2013   Procedure: RIGHT ULNAR NERVE DECOMPRESSION/TRANSPOSITION;  Surgeon: [name]., MD;  Location: [facility];  Service: Orthopedics;  Laterality: Right;   Family History  Problem Relation Age of Onset  . OCD Mother   . Bipolar disorder Mother   . Cancer Mother   . 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 Arthritis Sister   . OCD Sister   . Colon cancer Neg Hx   . Prostate cancer Neg Hx    Social History   Socioeconomic History  . Marital status: Married    Spouse name: Not on file  . Number of children: 1  . Years of education: Not on file  . Highest education level: Not on file  Occupational History  . Occupation: disabled    Employer: RETIRED    CommentDatabase administrator  Tobacco Use  . Smoking status: Former Smoker    Packs/day: 0.50    Years: 40.00    Pack years: 20.00     Types: Pipe    Quit date: 06/09/2016    Years since quitting: 3.3  . Smokeless tobacco: Never Used  Substance and Sexual Activity  . Alcohol use: No    Alcohol/week: 0.0 standard drinks  . Drug use: No  . Sexual activity: Never  Other Topics Concern  . Not on file  Social History Narrative   Married, 1992   1 biological child, some contact as of 2017   Wife has 2 kids, no contact as of 2017   Prev worked as Designer, television/film set, Lobbyist, etc   Caffeine use: 1/2 cup every morning- coffee   Right  handed    Social Determinants of Health   Financial Resource Strain: Low Risk   . Difficulty of Paying Living Expenses: Not hard at all  Food Insecurity: No Food Insecurity  . Worried About Charity fundraiser in the Last Year: Never true  . Ran Out of Food in the Last Year: Never true  Transportation Needs: No Transportation Needs  . Lack of Transportation (Medical): No  . Lack of Transportation (Non-Medical): No  Physical Activity: Inactive  . Days of Exercise per Week: 0 days  . Minutes of Exercise per Session: 0 min  Stress: No Stress Concern Present  . Feeling of Stress : Only a little  Social Connections:   . Frequency of Communication with Friends and Family: Not on file  . Frequency of Social Gatherings with Friends and Family: Not on file  . Attends Religious Services: Not on file  . Active Member of Clubs or Organizations: Not on file  . Attends Archivist Meetings: Not on file  . Marital Status: Not on file    Outpatient Encounter Medications as of 10/01/2019  Medication Sig  . ALPRAZolam (XANAX) 0.5 MG tablet TAKE 1/2 TO 1 TABLET TWICE DAILY IF NEEDED FOR ANXIETY  . canagliflozin (INVOKANA) 300 MG TABS tablet Take 1 tablet (300 mg total) by mouth daily before breakfast.  . FLUoxetine (PROZAC) 10 MG capsule TAKE 1 TO 2 CAPSULES BY MOUTH EVERY DAY  . fluticasone (FLONASE) 50 MCG/ACT nasal spray PLACE 1-2 SPRAYS INTO BOTH NOSTRILS DAILY AS NEEDED  .  glipiZIDE (GLUCOTROL XL) 2.5 MG 24 hr tablet TAKE THREE TABLETS EVERY DAY BEFORE BREAKFAST  . Insulin Glargine (LANTUS SOLOSTAR) 100 UNIT/ML Solostar Pen Inject under skin up to 30-50 UNITS DAILY AS DIRECTED  . Insulin Pen Needle (PEN NEEDLES) 32G X 4 MM MISC Inject 1 application as directed daily.  . magnesium oxide (MAG-OX) 400 MG tablet Take 400 mg by mouth daily.  . metFORMIN (GLUCOPHAGE-XR) 500 MG 24 hr tablet TAKE ONE TABLET 3 TIMES DAILY  . Multiple Vitamins-Minerals (CENTRUM SILVER 50+MEN PO) Take 1 tablet by mouth daily.  Marland Kitchen OVER THE COUNTER MEDICATION Hyland's Leg Cramp Pills  . oxyCODONE-acetaminophen (PERCOCET/ROXICET) 5-325 MG tablet TAKE ONE TO TWO TABLETS BY MOUTH 3 TIMES DAILY IF NEEDED FOR PAIN  . propranolol ER (INDERAL LA) 60 MG 24 hr capsule TAKE 1 CAPSULE EVERY DAY  . tiZANidine (ZANAFLEX) 4 MG tablet Take 1 tablet (4 mg total) by mouth every 8 (eight) hours as needed for muscle spasms. With an extra dose daily as needed.  . traZODone (DESYREL) 50 MG tablet TAKE 1.5 TABLETS BY MOUTH AT BEDTIME AS NEEDED FOR SLEEP   No facility-administered encounter medications on file as of 10/01/2019.    Activities of Daily Living In your present state of health, do you have any difficulty performing the following activities: 10/01/2019 05/10/2019  Hearing? Y N  Comment deaf in right ear, 10% in left ear -  Vision? N N  Difficulty concentrating or making decisions? Y N  Walking or climbing stairs? Y N  Dressing or bathing? Y N  Doing errands, shopping? Y -  Conservation officer, nature and eating ? Y -  Using the Toilet? N -  In the past six months, have you accidently leaked urine? N -  Do you have problems with loss of bowel control? N -  Managing your Medications? N -  Managing your Finances? N -  Housekeeping or managing your Housekeeping? Y -  Some  recent data might be hidden    Patient Care Team: [name], MD as PCP - General (Family Medicine) Kary Kos, MD as Consulting  Physician (Neurosurgery) Estill Cotta, MD as Referring Physician (Ophthalmology) Oneta Rack, MD as Referring Physician (Dermatology) Kary Kos, MD as Consulting Physician (Neurosurgery)   Assessment:   This is a routine wellness examination for Hani.  Exercise Activities and Dietary recommendations Current Exercise Habits: The patient does not participate in regular exercise at present, Exercise limited by: None identified  Goals    . medication adherence     Starting 09/28/2018, I will continue to take medications as prescribed in an effort to manage my health conditions.     . Patient Stated     10/01/2019, I will continue to take medications as prescribed in an effort to manage my health conditions.       Fall Risk Fall Risk  10/01/2019 09/28/2018 09/25/2017 01/06/2017 03/14/2016  Falls in the past year? 1 1 No Yes No  Comment balance issues multiple falls due to vertigo; unable to specify quanity - - -  Number falls in past yr: 1 1 - 2 or more -  Injury with Fall? 0 0 - No -  Risk Factor Category  - - - High Fall Risk -  Risk for fall due to : Impaired balance/gait;Medication side effect;History of fall(s) - - - -  Follow up Falls evaluation completed;Falls prevention discussed - - Falls evaluation completed -   Is the patient's home free of loose throw rugs in walkways, pet beds, electrical cords, etc?   yes      Grab bars in the bathroom? yes      Handrails on the stairs?   yes      Adequate lighting?   yes  Timed Get Up and Go Performed: N/A  Depression Screen PHQ 2/9 Scores 10/01/2019 09/28/2018 09/25/2017 03/14/2016  PHQ - 2 Score 0 0 0 0  PHQ- 9 Score 0 0 0 -    Cognitive Function MMSE - Mini Mental State Exam 10/01/2019 09/28/2018 12/17/2017 09/25/2017 03/14/2016  Orientation to time 5 5 4 5 5   Orientation to Place 5 5 5 5 5   Registration 3 3 3 3 3   Attention/ Calculation 5 0 5 0 0  Recall 3 2 3 3 3   Recall-comments - unable to recall 1 of 3 words - - -    Language- name 2 objects - 0 2 0 0  Language- repeat 1 1 1 1 1   Language- follow 3 step command - 3 3 3  0  Language- follow 3 step command-comments - - - - due to hx of stroke, pt is unable to write  Language- read & follow direction - 0 1 0 0  Write a sentence - 0 1 0 0  Copy design - 0 1 0 0  Total score - 19 29 20 17   Mini Cog  Mini-Cog screen was completed. Maximum score is 22. A value of 0 denotes this part of the MMSE was not completed or the patient failed this part of the Mini-Cog screening.       Immunization History  Administered Date(s) Administered  . Influenza Split 07/10/2011, 05/28/2012  . Influenza Whole 06/08/2008, 05/29/2009, 05/22/2010  . Influenza, High Dose Seasonal PF 06/09/2018  . Influenza,inj,Quad PF,6+ Mos 06/09/2018, 05/04/2019  . Influenza-Unspecified 06/30/2015, 07/12/2016, 06/13/2017  . Pneumococcal Conjugate-13 09/09/2014  . Pneumococcal Polysaccharide-23 02/19/2010, 01/03/2016  . Td 11/24/2006  . Tdap 12/13/2016  .  Zoster 12/22/2009    Qualifies for Shingles Vaccine? Yes  Screening Tests Health Maintenance  Topic Date Due  . HEMOGLOBIN A1C  02/03/2019  . FOOT EXAM  09/17/2019  . OPHTHALMOLOGY EXAM  04/09/2020  . COLONOSCOPY  04/14/2022  . TETANUS/TDAP  12/14/2026  . INFLUENZA VACCINE  Completed  . Hepatitis C Screening  Completed  . PNA vac Low Risk Adult  Completed   Cancer Screenings: Lung: Low Dose CT Chest recommended if Age 40-80 years, 30 pack-year currently smoking OR have quit w/in 15years. Patient does not qualify. Colorectal: completed 09/09/2012  Additional Screenings:  Hepatitis C Screening: 10/04/2015      Plan:   Patient will continue to take medications as prescribed in an effort to manage my health conditions.   I have personally reviewed and noted the following in the patient's chart:   . Medical and social history . Use of alcohol, tobacco or illicit drugs  . Current medications and supplements . Functional  ability and status . Nutritional status . Physical activity . Advanced directives . List of other physicians . Hospitalizations, surgeries, and ER visits in previous 12 months . Vitals . Screenings to include cognitive, depression, and falls . Referrals and appointments  In addition, I have reviewed and discussed with patient certain preventive protocols, quality metrics, and best practice recommendations. A written personalized care plan for preventive services as well as general preventive health recommendations were provided to patient.     Andrez Grime, LPN  [phone]

## 2019-10-05 ENCOUNTER — Other Ambulatory Visit: Payer: Self-pay

## 2019-10-05 ENCOUNTER — Telehealth: Payer: Self-pay

## 2019-10-05 ENCOUNTER — Ambulatory Visit: Payer: PPO

## 2019-10-05 ENCOUNTER — Ambulatory Visit (INDEPENDENT_AMBULATORY_CARE_PROVIDER_SITE_OTHER): Payer: PPO | Admitting: Family Medicine

## 2019-10-05 ENCOUNTER — Encounter: Payer: Self-pay | Admitting: Family Medicine

## 2019-10-05 VITALS — BP 144/84 | HR 84 | Temp 97.1°F | Ht 68.0 in | Wt 189.2 lb

## 2019-10-05 DIAGNOSIS — R251 Tremor, unspecified: Secondary | ICD-10-CM | POA: Diagnosis not present

## 2019-10-05 NOTE — Telephone Encounter (Signed)
I called pt about scheduling an appt. Pt stated he did not drive and his wife is scared to get on the interstate. They live in [place] and he stated its too far to drive. His wife only drives short distances like pharmacy and grocery store.I ask pt did he have any kids or family that can bring him to the office. The pt stated he has no kids or family or friends  that can bring him to appt. I offer pt a mychart video visit. Pt stated he does not know how to set up mychart account to do video visit. I stated Dr. [name] will be given a call back. Pt verbalized understanding.

## 2019-10-05 NOTE — Telephone Encounter (Signed)
I called Dr. [name] to give him update on pt. I stated upon trying to schedule pt he  stated his wife does not drive far and is scared of the highway. I also stated the wife reported he does not have any kids or family to assist with driving. I offer video visit but pt decline stating he does not how to set it up. Dr.Duncan stated he spoke with the wife earlier and she was okay with driving to [facility] to see Dr. Jannifer Franklin for her husbands tremors. He does not understand why they dont want to drive to the office. Dr, [name] wanted me to hold off scheduling pt for visit. He speak with the pt and wife and call me back for updates.

## 2019-10-05 NOTE — Telephone Encounter (Signed)
I called Dr.Duncan PCP for pt. He stated pts tremors are increasing. He has not seen pt in person but has done a video visit with his this month. He stated pts is getting worse and would like for pt to see Dr.Wllis. I stated pt was last seen 2019 I stated Dr. Jannifer Franklin is part time and works every other week and will be in the office next week. He will put referral in and send it over. He recommend I call pt for appt. I stated the earliest appt is in March and Dr.Duncan was okay if that was the earliest.

## 2019-10-05 NOTE — Progress Notes (Signed)
This visit occurred during the SARS-CoV-2 public health emergency.  Safety protocols were in place, including screening questions prior to the visit, additional usage of staff PPE, and extensive cleaning of exam room while observing appropriate contact time as indicated for disinfecting solutions.  A1c done at outside clinic.  I'll defer to endo, he agrees.   He has some stuttering at baseline.  He has anxiety at baseline.  Fall cautions d/w pt.  Still with L sciatica.  Still walking with a cane.  Still in pain at baseline.  No adverse effect on pain medication.  He is likely overall worse since the fall of 2020 with gait changes.  Gradual changes noted by patient and wife.  Tremor appears to be worse in the meantime.  Discussed options.  He agreed to go back to neurology in [place].  I called Dr. Tobey Grim clinic about getting him evaluated there.  They called the patient and then he said he did not want to go to [place]/their clinic.  We will put in a different referral.  PMH and SH reviewed  ROS: Per HPI unless specifically indicated in ROS section   Meds, vitals, and allergies reviewed.   GEN: nad, alert and oriented HEENT: ncat speech with stutter. NECK: supple w/o LA CV: rrr.  no murmur PULM: ctab, no inc wob ABD: soft, +bs EXT: no edema SKIN: no acute rash Overall bradykinesia noted.  Bilateral hand and jaw tremor noted.  He does not have foot tremor at rest.  He has decreased hearing in the left ear and absent hearing in the right ear at baseline.  Strength and sensation grossly normal in the extremities otherwise.

## 2019-10-05 NOTE — Patient Instructions (Signed)
Let me check with Dr. Jannifer Franklin and we'll be in touch.  Take care.  Glad to see you.

## 2019-10-05 NOTE — Telephone Encounter (Signed)
Dr.Duncan with [facility] Would like to know if any other orders are needed before patient is seen.Patient has had recent lab and imaging orders done.  Dr.Duncan states he will be sending over a new referral and updated OV notes for review but wanted to touch base with Dr.Willis prior to the patients apt if possible.   Dr.Duncan has provided his personal cell phone# where he can be reached directly.   CB#[phone]

## 2019-10-07 ENCOUNTER — Encounter: Payer: Self-pay | Admitting: Family Medicine

## 2019-10-07 LAB — LAB REPORT - SCANNED
A1c: 9.8
ALT: 34 — AB (ref 3–30)
AST: 27
Cholesterol: 183
Creatinine, Ser: 1.1
Glucose: 220
HDL: 43.9
LDL (calc): 108
Triglycerides: 154 (ref 40–160)

## 2019-10-07 NOTE — Assessment & Plan Note (Signed)
I called Dr. Jannifer Franklin' clinic about getting him evaluated there.  They called the patient and then he said he did not want to go to [place]/their clinic.  That led to a callback from neurology.  We will put in a different referral.   He does appear to be worse in the meantime.  I do not know if it is primary progression of his underlying tremor, if it is exacerbated by anxiety/chronic pain, or if there is another issue that is contributing to his current symptoms.  He had previous MRI brain done.  I did not repeat his imaging at this point.  He has gradual changes over the last few months.  I greatly appreciate neurology input.  At least 30 minutes were devoted to patient care in this encounter (this can potentially include time spent reviewing the patient's file/history, interviewing and examining the patient, counseling/reviewing plan with patient, ordering referrals, ordering tests, reviewing relevant laboratory or x-ray data, and documenting the encounter).

## 2019-10-12 ENCOUNTER — Encounter: Payer: Self-pay | Admitting: Family Medicine

## 2019-10-19 ENCOUNTER — Other Ambulatory Visit: Payer: Self-pay | Admitting: Family Medicine

## 2019-10-19 NOTE — Telephone Encounter (Signed)
Name of Medication: Oxycodone Name of Pharmacy: [facility] or Written Date and Quantity: 10/05/19 #180 Last Office Visit and Type: 10/05/19 Next Office Visit and Type: None Last Controlled Substance Agreement Date: 02/28/15 Last UDS:03/14/16

## 2019-10-20 MED ORDER — OXYCODONE-ACETAMINOPHEN 5-325 MG PO TABS: ORAL_TABLET | ORAL | 0 refills | Status: DC

## 2019-10-20 NOTE — Telephone Encounter (Signed)
Prescription sent.  MyChart message sent to patient.  Thanks.

## 2019-11-05 ENCOUNTER — Ambulatory Visit: Payer: PPO

## 2019-11-11 DIAGNOSIS — Z794 Long term (current) use of insulin: Secondary | ICD-10-CM | POA: Diagnosis not present

## 2019-11-11 DIAGNOSIS — G8929 Other chronic pain: Secondary | ICD-10-CM | POA: Diagnosis not present

## 2019-11-11 DIAGNOSIS — E1169 Type 2 diabetes mellitus with other specified complication: Secondary | ICD-10-CM | POA: Diagnosis not present

## 2019-11-11 DIAGNOSIS — E1165 Type 2 diabetes mellitus with hyperglycemia: Secondary | ICD-10-CM | POA: Diagnosis not present

## 2019-11-11 DIAGNOSIS — Z789 Other specified health status: Secondary | ICD-10-CM | POA: Diagnosis not present

## 2019-11-11 DIAGNOSIS — R251 Tremor, unspecified: Secondary | ICD-10-CM | POA: Diagnosis not present

## 2019-11-11 DIAGNOSIS — Z0001 Encounter for general adult medical examination with abnormal findings: Secondary | ICD-10-CM | POA: Diagnosis not present

## 2019-11-11 DIAGNOSIS — Z0000 Encounter for general adult medical examination without abnormal findings: Secondary | ICD-10-CM | POA: Diagnosis not present

## 2019-11-11 DIAGNOSIS — F5101 Primary insomnia: Secondary | ICD-10-CM | POA: Diagnosis not present

## 2019-11-11 DIAGNOSIS — E785 Hyperlipidemia, unspecified: Secondary | ICD-10-CM | POA: Diagnosis not present

## 2019-11-11 DIAGNOSIS — I10 Essential (primary) hypertension: Secondary | ICD-10-CM | POA: Diagnosis not present

## 2019-11-11 DIAGNOSIS — M545 Low back pain: Secondary | ICD-10-CM | POA: Diagnosis not present

## 2019-11-12 ENCOUNTER — Ambulatory Visit: Payer: PPO | Admitting: Neurology

## 2019-11-12 ENCOUNTER — Other Ambulatory Visit: Payer: Self-pay

## 2019-11-12 ENCOUNTER — Encounter: Payer: Self-pay | Admitting: Neurology

## 2019-11-12 VITALS — BP 148/89 | HR 81 | Temp 97.4°F | Ht 68.0 in | Wt 188.0 lb

## 2019-11-12 DIAGNOSIS — E1142 Type 2 diabetes mellitus with diabetic polyneuropathy: Secondary | ICD-10-CM | POA: Diagnosis not present

## 2019-11-12 DIAGNOSIS — R269 Unspecified abnormalities of gait and mobility: Secondary | ICD-10-CM

## 2019-11-12 DIAGNOSIS — R413 Other amnesia: Secondary | ICD-10-CM

## 2019-11-12 DIAGNOSIS — R251 Tremor, unspecified: Secondary | ICD-10-CM

## 2019-11-12 DIAGNOSIS — G250 Essential tremor: Secondary | ICD-10-CM | POA: Diagnosis not present

## 2019-11-12 HISTORY — DX: Unspecified abnormalities of gait and mobility: R26.9

## 2019-11-12 NOTE — Progress Notes (Signed)
Reason for visit: Essential tremor, memory disorder, gait disorder  Referring physician: Dr. Veva Holes is a 74 y.o. male  History of present illness:  Walter Harris is a 74 year old right-handed white male with a longstanding history of essential tremor. His mother had a similar tremor before she died. The patient has had gradual worsening of the tremor, he is quite frustrated at times with this issue because he is unable to do things he needs to do or wants to do. 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 He has good days and bad days with the tremor, at times he needs help feeding himself. His handwriting is very poor. He at times will have a stuttering speech pattern, he has a mild head and neck tremor as well. He has significant problems with balance, he has fallen on occasion. He uses a cane or a walker at times. He does have diabetes and is felt to have some degree of neuropathy. He has a history of cervical spine surgery, he has diffuse pain and requires opiate medications. He was seen by Dr. Carles Collet in the past, he was evaluated for possible deep brain stimulator for his tremor but apparently he never took the steps to follow through with this. He is interested in focused ultrasound therapy for essential tremor. He is on propranolol taking 60 mg LA capsule 1 a day. In the past, he was not able to tolerate Mysoline, he has been on gabapentin previously and he has some alprazolam to take if needed. He continues to have mild memory issues, mainly short-term memory problems.  Past Medical History:  Diagnosis Date  . Basal cell carcinoma    multiple  . Cataracts, both eyes 2019   surgery pending April 2020  . Complication of anesthesia    post-operative cognitive dysfunction after ACDF on 05/01/11 [facility])  . Deaf    right ear  . Diabetes mellitus   . DJD (degenerative joint disease)    lumbar spine  . Hearing impaired    hearing aids-transmitter rt   . History of  blood transfusion    as a baby in 31  . History of colonic polyps   . Hyperlipidemia   . Hypertension    pt. denies high blood pressure  . Kidney stone 1967  . Memory change 12/17/2017  . Nose fracture    3x  . OCD (obsessive compulsive disorder)   . Stroke [facility])    "mini stroke, that's what gave me the tremors"  . Stuttering   . Tremor, essential   . Vertigo    random  . Wears dentures    full upper  . Wears glasses     Past Surgical History:  Procedure Laterality Date  . APPENDECTOMY  1960  . BACK SURGERY  2012   lumbar surgery  . CATARACT EXTRACTION W/PHACO Right 04/12/2019   Procedure: CATARACT EXTRACTION PHACO AND INTRAOCULAR LENS PLACEMENT (IOC) RIGHT, DIABETIC;  Surgeon: [name], MD;  Location: [facility];  Service: Ophthalmology;  Laterality: Right;  Diabetic - insulin and oral meds  . CATARACT EXTRACTION W/PHACO Left 05/10/2019   Procedure: CATARACT EXTRACTION PHACO AND INTRAOCULAR LENS PLACEMENT (IOC)  LEFT DIABETIC  00:25.6  14.4%  3.75;  Surgeon: [name], MD;  Location: [facility];  Service: Ophthalmology;  Laterality: Left;  Diabetic - insulin and oral meds  . CERVICAL FUSION  1990  . CERVICAL FUSION  8/12  multiple levels with plates  . CHOLESTEATOMA EXCISION  1993  . COLLATERAL LIGAMENT REPAIR, ELBOW     bilateral, nerve improvement left 08/04, right 09/04  . COLONOSCOPY    . CYST EXCISION     back of cervical spine total of 5 surgeries  . EXTERNAL EAR SURGERY     multiple ear surguries as a child  . OTHER SURGICAL HISTORY     benign head tumor#5  . OTHER SURGICAL HISTORY     sebaceous cysts-post neck x5  . SHOULDER ARTHROSCOPY W/ ACROMIAL REPAIR  08/2004   left shoulder  . TONSILLECTOMY    . ULNAR NERVE TRANSPOSITION Right 05/13/2013   Procedure: RIGHT ULNAR NERVE DECOMPRESSION/TRANSPOSITION;  Surgeon: [name]., MD;  Location: [facility];  Service: Orthopedics;  Laterality: Right;     Family History  Problem Relation Age of Onset  . OCD Mother   . Bipolar disorder Mother   . Cancer Mother   . 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 Arthritis Sister   . OCD Sister   . Colon cancer Neg Hx   . Prostate cancer Neg Hx     Social history:  reports that he quit smoking about 3 years ago. His smoking use included pipe. He has a 20.00 pack-year smoking history. He has never used smokeless tobacco. He reports that he does not drink alcohol or use drugs.  Medications:  Prior to Admission medications   Medication Sig Start Date End Date Taking? Authorizing Provider  ALPRAZolam Duanne Moron) 0.5 MG tablet TAKE 1/2 TO 1 TABLET TWICE DAILY IF NEEDED FOR ANXIETY 09/17/19  Yes [name], MD  canagliflozin [facility]) 300 MG TABS tablet Take 1 tablet (300 mg total) by mouth daily before breakfast. 02/03/18  Yes Philemon Kingdom, MD  glipiZIDE (GLUCOTROL XL) 2.5 MG 24 hr tablet TAKE THREE TABLETS EVERY DAY BEFORE BREAKFAST 08/30/19  Yes Philemon Kingdom, MD  Insulin Glargine (LANTUS SOLOSTAR) 100 UNIT/ML Solostar Pen Inject under skin up to 30-50 UNITS DAILY AS DIRECTED 09/16/19  Yes [name], MD  Insulin Pen Needle (PEN NEEDLES) 32G X 4 MM MISC Inject 1 application as directed daily. 03/25/18  Yes [name], MD  metFORMIN (GLUCOPHAGE-XR) 500 MG 24 hr tablet TAKE ONE TABLET 3 TIMES DAILY 07/03/18  Yes Philemon Kingdom, MD  oxyCODONE-acetaminophen (PERCOCET/ROXICET) 5-325 MG tablet TAKE ONE TO TWO TABLETS BY MOUTH 3 TIMES DAILY IF NEEDED FOR PAIN 10/20/19   [name], MD  propranolol ER (INDERAL LA) 60 MG 24 hr capsule TAKE 1 CAPSULE EVERY DAY 09/24/19   [name], MD  tiZANidine (ZANAFLEX) 4 MG tablet Take 1 tablet (4 mg total) by mouth every 8 (eight) hours as needed for muscle spasms. With an extra dose daily as needed. 01/12/19   [name], MD  traZODone (DESYREL) 50 MG tablet TAKE 1.5 TABLETS BY MOUTH AT BEDTIME AS NEEDED FOR  SLEEP 09/16/19   [name], MD      Allergies  Allergen Reactions  . Bee Venom Anaphylaxis  . Pravastatin Other (See Comments)    Severe myalgias  . Ace Inhibitors     Cough  . Actos [Pioglitazone] Other (See Comments)    Intolerant, not allergic.   . Angiotensin Receptor Blockers Other (See Comments)    cramps  . Aripiprazole Other (See Comments)    Unknown reaction many years ago  . Crestor [Rosuvastatin Calcium]  Other (See Comments)    Aches, even with twice weekly dosing  . Metformin And Related Other (See Comments)    Able to tolerate XR formulation, not allergic.   . Olanzapine Other (See Comments)    Unknown reaction many years ago  . Pneumovax 23 [Pneumococcal Vac Polyvalent] Other (See Comments)    aches  . Primidone Other (See Comments)    Vertigo at 50mg , able to tolerate 25mg    . Codeine Rash  . Divalproex Sodium Other (See Comments)     tremor  . Lithium Carbonate Other (See Comments)    Tremors   . Sulfamethoxazole-Trimethoprim Other (See Comments)    Mild reaction per Dr. Silvio Pate, pt does not remember the reaction    ROS:  Out of a complete 14 system review of symptoms, the patient complains only of the following symptoms, and all other reviewed systems are negative.  Tremor Memory problems Walking difficulty  Blood pressure (!) 148/89, pulse 81, temperature (!) 97.4 F (36.3 C), temperature source Temporal, height 5\' 8"  (1.727 m), weight 188 lb (85.3 kg).  Physical Exam  General: The patient is alert and cooperative at the time of the examination.  Eyes: Pupils are equal, round, and reactive to light. Discs are flat bilaterally.  Neck: The neck is supple, no carotid bruits are noted.  Respiratory: The respiratory examination is clear.  Cardiovascular: The cardiovascular examination reveals a regular rate and rhythm, no obvious murmurs or rubs are noted.  Skin: Extremities are without significant edema.  Neurologic Exam  Mental  status: The patient is alert and oriented x 3 at the time of the examination. The Mini-Mental status examination done today shows a total score of 26/30. The patient however lost 2 points because he was unable to hold the pen to draw the figure or to write a sentence.  Cranial nerves: Facial symmetry is present. There is good sensation of the face to pinprick and soft touch bilaterally. The strength of the facial muscles and the muscles to head turning and shoulder shrug are normal bilaterally. Speech is well enunciated, no aphasia or dysarthria is noted. At times, the patient has a stuttering type speech. Extraocular movements are full. Visual fields are full. The tongue is midline, and the patient has symmetric elevation of the soft palate. No obvious hearing deficits are noted. A mild side-to-side head tremor is seen.  Motor: The motor testing reveals 5 over 5 strength of all 4 extremities. Good symmetric motor tone is noted throughout.  Sensory: Sensory testing is intact to pinprick, soft touch, vibration sensation, and position sense on all 4 extremities. No evidence of extinction is noted.  Coordination: Cerebellar testing reveals good finger-nose-finger and heel-to-shin bilaterally. An intention tremor seen with finger-nose-finger bilaterally. With handwriting, very prominent tremor seen when drawing a spiral.  Gait and station: Gait is wide-based, the patient is able to walk independently but usually uses a cane. Tandem gait was not attempted. Romberg is negative.  Reflexes: Deep tendon reflexes are symmetric, but are somewhat depressed bilaterally. Toes are downgoing bilaterally.   Assessment/Plan:  1. Essential tremor  2. Mild memory disturbance  3. Gait disturbance  The patient is on propranolol 60 mg LA tablet 1 a day, his blood pressure and pulse likely could tolerate the increase. We talked about going to the 80 mg LA capsule but the patient wishes to double his current dose to  120 mg daily. He will call me if this is well-tolerated and is helpful. The patient may  be given a trial on Topamax in the future, would need to look out for any cognitive side effects on this medication. The patient will look into whether or not his insurance may pay for focused ultrasound therapy for tremor, if so, he may want a referral for a consultation to [facility] in [place], [place].  Jill Alexanders MD 11/12/2019 9:16 AM  [facility] [address] [place],  [address]  Phone [phone] Fax [phone]

## 2019-11-16 ENCOUNTER — Other Ambulatory Visit: Payer: Self-pay | Admitting: Family Medicine

## 2019-11-16 NOTE — Telephone Encounter (Signed)
MyChart RF request:  Oxycodone Last office visit:   10/05/2019 Last Filled:    180 tablet 0 10/20/2019   Please advise.

## 2019-11-17 ENCOUNTER — Other Ambulatory Visit: Payer: Self-pay | Admitting: Family Medicine

## 2019-11-17 MED ORDER — OXYCODONE-ACETAMINOPHEN 5-325 MG PO TABS: ORAL_TABLET | ORAL | 0 refills | Status: DC

## 2019-11-17 NOTE — Telephone Encounter (Signed)
Sent. Thanks.   

## 2019-11-17 NOTE — Telephone Encounter (Signed)
Electronic refill request Trazodone Last refill 09/02/19 #135 Last office visit 10/05/19 UDS 03/14/16 Last contract 02/28/15

## 2019-11-26 DIAGNOSIS — X32XXXA Exposure to sunlight, initial encounter: Secondary | ICD-10-CM | POA: Diagnosis not present

## 2019-11-26 DIAGNOSIS — D225 Melanocytic nevi of trunk: Secondary | ICD-10-CM | POA: Diagnosis not present

## 2019-11-26 DIAGNOSIS — Z08 Encounter for follow-up examination after completed treatment for malignant neoplasm: Secondary | ICD-10-CM | POA: Diagnosis not present

## 2019-11-26 DIAGNOSIS — L570 Actinic keratosis: Secondary | ICD-10-CM | POA: Diagnosis not present

## 2019-11-26 DIAGNOSIS — D485 Neoplasm of uncertain behavior of skin: Secondary | ICD-10-CM | POA: Diagnosis not present

## 2019-11-26 DIAGNOSIS — C44329 Squamous cell carcinoma of skin of other parts of face: Secondary | ICD-10-CM | POA: Diagnosis not present

## 2019-11-26 DIAGNOSIS — L821 Other seborrheic keratosis: Secondary | ICD-10-CM | POA: Diagnosis not present

## 2019-11-26 DIAGNOSIS — Z85828 Personal history of other malignant neoplasm of skin: Secondary | ICD-10-CM | POA: Diagnosis not present

## 2019-12-06 ENCOUNTER — Other Ambulatory Visit: Payer: Self-pay | Admitting: Internal Medicine

## 2019-12-06 ENCOUNTER — Ambulatory Visit
Admission: RE | Admit: 2019-12-06 | Discharge: 2019-12-06 | Disposition: A | Discharge status: Home / Self Care | Payer: PPO | Source: Ambulatory Visit | Attending: Internal Medicine | Admitting: Internal Medicine

## 2019-12-06 ENCOUNTER — Encounter: Payer: Self-pay | Admitting: Emergency Medicine

## 2019-12-06 ENCOUNTER — Inpatient Hospital Stay
Admission: EM | Admit: 2019-12-06 | Discharge: 2019-12-09 | DRG: 066 | Disposition: A | Discharge status: Home / Self Care | Payer: PPO | Attending: Internal Medicine | Admitting: Internal Medicine

## 2019-12-06 ENCOUNTER — Other Ambulatory Visit: Payer: Self-pay

## 2019-12-06 ENCOUNTER — Emergency Department: Payer: PPO

## 2019-12-06 ENCOUNTER — Observation Stay: Payer: PPO

## 2019-12-06 DIAGNOSIS — R413 Other amnesia: Secondary | ICD-10-CM | POA: Diagnosis not present

## 2019-12-06 DIAGNOSIS — I639 Cerebral infarction, unspecified: Secondary | ICD-10-CM | POA: Diagnosis not present

## 2019-12-06 DIAGNOSIS — Z888 Allergy status to other drugs, medicaments and biological substances status: Secondary | ICD-10-CM

## 2019-12-06 DIAGNOSIS — Z91030 Bee allergy status: Secondary | ICD-10-CM

## 2019-12-06 DIAGNOSIS — I6389 Other cerebral infarction: Secondary | ICD-10-CM | POA: Diagnosis not present

## 2019-12-06 DIAGNOSIS — Z887 Allergy status to serum and vaccine status: Secondary | ICD-10-CM

## 2019-12-06 DIAGNOSIS — I10 Essential (primary) hypertension: Secondary | ICD-10-CM | POA: Diagnosis present

## 2019-12-06 DIAGNOSIS — G250 Essential tremor: Secondary | ICD-10-CM | POA: Diagnosis present

## 2019-12-06 DIAGNOSIS — R4702 Dysphasia: Secondary | ICD-10-CM

## 2019-12-06 DIAGNOSIS — Z9842 Cataract extraction status, left eye: Secondary | ICD-10-CM

## 2019-12-06 DIAGNOSIS — H9190 Unspecified hearing loss, unspecified ear: Secondary | ICD-10-CM | POA: Diagnosis not present

## 2019-12-06 DIAGNOSIS — R4189 Other symptoms and signs involving cognitive functions and awareness: Secondary | ICD-10-CM

## 2019-12-06 DIAGNOSIS — I63232 Cerebral infarction due to unspecified occlusion or stenosis of left carotid arteries: Secondary | ICD-10-CM | POA: Diagnosis not present

## 2019-12-06 DIAGNOSIS — Z882 Allergy status to sulfonamides status: Secondary | ICD-10-CM

## 2019-12-06 DIAGNOSIS — Z87891 Personal history of nicotine dependence: Secondary | ICD-10-CM

## 2019-12-06 DIAGNOSIS — Z86010 Personal history of colonic polyps: Secondary | ICD-10-CM

## 2019-12-06 DIAGNOSIS — Z885 Allergy status to narcotic agent status: Secondary | ICD-10-CM

## 2019-12-06 DIAGNOSIS — E119 Type 2 diabetes mellitus without complications: Secondary | ICD-10-CM | POA: Diagnosis not present

## 2019-12-06 DIAGNOSIS — Z8673 Personal history of transient ischemic attack (TIA), and cerebral infarction without residual deficits: Secondary | ICD-10-CM

## 2019-12-06 DIAGNOSIS — R29702 NIHSS score 2: Secondary | ICD-10-CM | POA: Diagnosis not present

## 2019-12-06 DIAGNOSIS — Z66 Do not resuscitate: Secondary | ICD-10-CM | POA: Diagnosis not present

## 2019-12-06 DIAGNOSIS — R29701 NIHSS score 1: Secondary | ICD-10-CM | POA: Diagnosis not present

## 2019-12-06 DIAGNOSIS — R4182 Altered mental status, unspecified: Secondary | ICD-10-CM | POA: Insufficient documentation

## 2019-12-06 DIAGNOSIS — R29707 NIHSS score 7: Secondary | ICD-10-CM | POA: Diagnosis present

## 2019-12-06 DIAGNOSIS — R4782 Fluency disorder in conditions classified elsewhere: Secondary | ICD-10-CM | POA: Diagnosis not present

## 2019-12-06 DIAGNOSIS — Z981 Arthrodesis status: Secondary | ICD-10-CM

## 2019-12-06 DIAGNOSIS — I6522 Occlusion and stenosis of left carotid artery: Secondary | ICD-10-CM | POA: Diagnosis not present

## 2019-12-06 DIAGNOSIS — E785 Hyperlipidemia, unspecified: Secondary | ICD-10-CM | POA: Diagnosis not present

## 2019-12-06 DIAGNOSIS — R29703 NIHSS score 3: Secondary | ICD-10-CM | POA: Diagnosis not present

## 2019-12-06 DIAGNOSIS — I63512 Cerebral infarction due to unspecified occlusion or stenosis of left middle cerebral artery: Principal | ICD-10-CM | POA: Diagnosis present

## 2019-12-06 DIAGNOSIS — E1142 Type 2 diabetes mellitus with diabetic polyneuropathy: Secondary | ICD-10-CM

## 2019-12-06 DIAGNOSIS — Z794 Long term (current) use of insulin: Secondary | ICD-10-CM

## 2019-12-06 DIAGNOSIS — Z87442 Personal history of urinary calculi: Secondary | ICD-10-CM | POA: Diagnosis not present

## 2019-12-06 DIAGNOSIS — Z85828 Personal history of other malignant neoplasm of skin: Secondary | ICD-10-CM | POA: Diagnosis not present

## 2019-12-06 DIAGNOSIS — F429 Obsessive-compulsive disorder, unspecified: Secondary | ICD-10-CM | POA: Diagnosis not present

## 2019-12-06 DIAGNOSIS — I160 Hypertensive urgency: Secondary | ICD-10-CM | POA: Diagnosis not present

## 2019-12-06 DIAGNOSIS — R4701 Aphasia: Secondary | ICD-10-CM | POA: Diagnosis not present

## 2019-12-06 DIAGNOSIS — Z79899 Other long term (current) drug therapy: Secondary | ICD-10-CM

## 2019-12-06 DIAGNOSIS — Z818 Family history of other mental and behavioral disorders: Secondary | ICD-10-CM

## 2019-12-06 DIAGNOSIS — Z961 Presence of intraocular lens: Secondary | ICD-10-CM | POA: Diagnosis not present

## 2019-12-06 DIAGNOSIS — Z9841 Cataract extraction status, right eye: Secondary | ICD-10-CM

## 2019-12-06 DIAGNOSIS — Z20822 Contact with and (suspected) exposure to covid-19: Secondary | ICD-10-CM | POA: Diagnosis present

## 2019-12-06 HISTORY — DX: Cerebral infarction, unspecified: I63.9

## 2019-12-06 LAB — CBC
HCT: 46.7 % (ref 39.0–52.0)
Hemoglobin: 15.7 g/dL (ref 13.0–17.0)
MCH: 29.7 pg (ref 26.0–34.0)
MCHC: 33.6 g/dL (ref 30.0–36.0)
MCV: 88.4 fL (ref 80.0–100.0)
Platelets: 213 10*3/uL (ref 150–400)
RBC: 5.28 MIL/uL (ref 4.22–5.81)
RDW: 12.5 % (ref 11.5–15.5)
WBC: 7.9 10*3/uL (ref 4.0–10.5)
nRBC: 0 % (ref 0.0–0.2)

## 2019-12-06 LAB — BASIC METABOLIC PANEL
Anion gap: 13 (ref 5–15)
BUN: 22 mg/dL (ref 8–23)
CO2: 23 mmol/L (ref 22–32)
Calcium: 9.5 mg/dL (ref 8.9–10.3)
Chloride: 101 mmol/L (ref 98–111)
Creatinine, Ser: 1.19 mg/dL (ref 0.61–1.24)
GFR calc Af Amer: >60 mL/min (ref >60)
GFR calc non Af Amer: >60 mL/min (ref >60)
Glucose, Bld: 135 mg/dL — ABNORMAL HIGH (ref 70–99)
Potassium: 4.3 mmol/L (ref 3.5–5.1)
Sodium: 137 mmol/L (ref 135–145)

## 2019-12-06 LAB — GLUCOSE, CAPILLARY
Glucose-Capillary: 161 mg/dL — ABNORMAL HIGH (ref 70–99)
Glucose-Capillary: 93 mg/dL (ref 70–99)

## 2019-12-06 LAB — TROPONIN I (HIGH SENSITIVITY)
Troponin I (High Sensitivity): 6 ng/L (ref <18)
Troponin I (High Sensitivity): 6 ng/L (ref <18)

## 2019-12-06 MED ORDER — PROPRANOLOL HCL ER 60 MG PO CP24
60.0000 mg | ORAL_CAPSULE | Freq: Every day | ORAL | Status: DC
  Administered 2019-12-07 – 2019-12-09 (×3): 60 mg via ORAL
  Filled 2019-12-06 (×3): qty 1

## 2019-12-06 MED ORDER — INSULIN ASPART 100 UNIT/ML ~~LOC~~ SOLN: 0.0000 [IU] | Freq: Three times a day (TID) | SUBCUTANEOUS | Status: DC

## 2019-12-06 MED ORDER — SODIUM CHLORIDE 0.9 % IV SOLN: INTRAVENOUS | Status: DC

## 2019-12-06 MED ORDER — ACETAMINOPHEN 160 MG/5ML PO SOLN
650.0000 mg | ORAL | Status: DC | PRN
  Filled 2019-12-06: qty 20.3

## 2019-12-06 MED ORDER — ASPIRIN EC 325 MG PO TBEC
325.0000 mg | DELAYED_RELEASE_TABLET | Freq: Every day | ORAL | Status: DC
  Administered 2019-12-07: 325 mg via ORAL
  Filled 2019-12-06: qty 1

## 2019-12-06 MED ORDER — ASPIRIN EC 81 MG PO TBEC: 81.0000 mg | DELAYED_RELEASE_TABLET | Freq: Every day | ORAL | Status: DC

## 2019-12-06 MED ORDER — ACETAMINOPHEN 325 MG PO TABS
650.0000 mg | ORAL_TABLET | ORAL | Status: DC | PRN
  Administered 2019-12-06: 650 mg via ORAL
  Filled 2019-12-06: qty 2

## 2019-12-06 MED ORDER — SENNOSIDES-DOCUSATE SODIUM 8.6-50 MG PO TABS: 1.0000 | ORAL_TABLET | Freq: Every evening | ORAL | Status: DC | PRN

## 2019-12-06 MED ORDER — ALPRAZOLAM 0.5 MG PO TABS
0.2500 mg | ORAL_TABLET | Freq: Three times a day (TID) | ORAL | Status: DC | PRN
  Administered 2019-12-07: 0.25 mg via ORAL
  Filled 2019-12-06: qty 1

## 2019-12-06 MED ORDER — ENOXAPARIN SODIUM 40 MG/0.4ML ~~LOC~~ SOLN
40.0000 mg | SUBCUTANEOUS | Status: DC
  Administered 2019-12-06 – 2019-12-08 (×2): 40 mg via SUBCUTANEOUS
  Filled 2019-12-06 (×3): qty 0.4

## 2019-12-06 MED ORDER — INSULIN ASPART 100 UNIT/ML ~~LOC~~ SOLN
0.0000 [IU] | Freq: Three times a day (TID) | SUBCUTANEOUS | Status: DC
  Administered 2019-12-07: 2 [IU] via SUBCUTANEOUS
  Administered 2019-12-08: 1 [IU] via SUBCUTANEOUS
  Administered 2019-12-08: 2 [IU] via SUBCUTANEOUS
  Administered 2019-12-08: 1 [IU] via SUBCUTANEOUS
  Administered 2019-12-09: 2 [IU] via SUBCUTANEOUS
  Filled 2019-12-06 (×5): qty 1

## 2019-12-06 MED ORDER — CLOPIDOGREL BISULFATE 75 MG PO TABS: 75.0000 mg | ORAL_TABLET | Freq: Every day | ORAL | Status: DC

## 2019-12-06 MED ORDER — ALPRAZOLAM 0.5 MG PO TABS
0.5000 mg | ORAL_TABLET | Freq: Once | ORAL | Status: AC
  Administered 2019-12-06: 0.5 mg via ORAL
  Filled 2019-12-06: qty 1

## 2019-12-06 MED ORDER — DROPERIDOL 2.5 MG/ML IJ SOLN
2.5000 mg | Freq: Once | INTRAMUSCULAR | Status: AC
  Administered 2019-12-06: 2.5 mg via INTRAVENOUS
  Filled 2019-12-06: qty 2

## 2019-12-06 MED ORDER — SODIUM CHLORIDE 0.9% FLUSH: 3.0000 mL | Freq: Once | INTRAVENOUS | Status: DC

## 2019-12-06 MED ORDER — TRAZODONE HCL 50 MG PO TABS
75.0000 mg | ORAL_TABLET | Freq: Every evening | ORAL | Status: DC | PRN
  Administered 2019-12-06 – 2019-12-08 (×3): 75 mg via ORAL
  Filled 2019-12-06 (×3): qty 2

## 2019-12-06 MED ORDER — ASPIRIN EC 325 MG PO TBEC: 325.0000 mg | DELAYED_RELEASE_TABLET | Freq: Every day | ORAL | Status: DC

## 2019-12-06 MED ORDER — CANAGLIFLOZIN 300 MG PO TABS
300.0000 mg | ORAL_TABLET | Freq: Every day | ORAL | Status: DC
  Administered 2019-12-07 – 2019-12-09 (×3): 300 mg via ORAL
  Filled 2019-12-06 (×2): qty 1
  Filled 2019-12-06: qty 3

## 2019-12-06 MED ORDER — ACETAMINOPHEN 650 MG RE SUPP: 650.0000 mg | RECTAL | Status: DC | PRN

## 2019-12-06 MED ORDER — LORAZEPAM 2 MG/ML IJ SOLN
1.0000 mg | Freq: Once | INTRAMUSCULAR | Status: AC
  Administered 2019-12-06: 1 mg via INTRAVENOUS
  Filled 2019-12-06: qty 1

## 2019-12-06 MED ORDER — ASPIRIN 81 MG PO CHEW
324.0000 mg | CHEWABLE_TABLET | Freq: Once | ORAL | Status: AC
  Administered 2019-12-06: 324 mg via ORAL
  Filled 2019-12-06: qty 4

## 2019-12-06 MED ORDER — STROKE: EARLY STAGES OF RECOVERY BOOK: Freq: Once | Status: DC

## 2019-12-06 NOTE — ED Notes (Signed)
Pt in mri 

## 2019-12-06 NOTE — ED Triage Notes (Signed)
Pt in via POV, sent over from PCP office for further Stroke evaluation.  Per wife, pt with total memory loss, onset last week but was unable to get in to see doctor until today.  Wife also reports difficulty with speech over the last week as well.  Pt alert, delayed responses noted.    Tearful in triage.

## 2019-12-06 NOTE — ED Notes (Signed)
Pt increasing agitated.  md at bedside.  meds ordered and given.  Pt cursing and swinging at staff.  Family at bedside.  siderails up x 2.  Pt trying to get out of bed and has pulled the leads, bp cuff off.  Iv still in place with cobain.  2 sitters in with pt.  Charge nurse aware and in room also.

## 2019-12-06 NOTE — ED Notes (Signed)
Pt to floor from mri by er tech

## 2019-12-06 NOTE — ED Notes (Signed)
report called to Walter Harris

## 2019-12-06 NOTE — ED Notes (Signed)
Pt calm now.  Sitter with pt.  Labs sent.  hospitalist in with pt

## 2019-12-06 NOTE — ED Provider Notes (Signed)
[facility] Emergency Department Provider Note   ____________________________________________   First MD Initiated Contact with Patient 12/06/19 1734     (approximate)  I have reviewed the triage vital signs and the nursing notes.   HISTORY  Chief Complaint Memory Loss    HPI Walter Harris is a 74 y.o. male with past medical history of hypertension, hyperlipidemia, diabetes, anxiety, and tremor who presents to the ED for altered mental status.  History is obtained from wife, who states that she noticed patient was acting differently about 9 days ago.  She first noticed that his memory seem to be off with difficulties particularly with short-term memory.  He also seemed to have changes in his speech, with a stuttering speech pattern and difficulty finding words.  She states he has some short temper at baseline, but this has seemed to worsen with the change in memory and speech.  She has not noticed any weakness and he has been walking without difficulty.  Patient initially refused to seek care in the ED, but was seen by his PCP today, who ordered CT scan that was positive for left MCA stroke affecting his frontal lobe.  He was sent to the ED for further evaluation.        Past Medical History:  Diagnosis Date  . Basal cell carcinoma    multiple  . Cataracts, both eyes 2019   surgery pending April 2020  . Complication of anesthesia    post-operative cognitive dysfunction after ACDF on 05/01/11 [facility])  . Deaf    right ear  . Diabetes mellitus   . DJD (degenerative joint disease)    lumbar spine  . Gait abnormality 11/12/2019  . Hearing impaired    hearing aids-transmitter rt   . History of blood transfusion    as a baby in 87  . History of colonic polyps   . Hyperlipidemia   . Hypertension    pt. denies high blood pressure  . Kidney stone 1967  . Memory change 12/17/2017  . Nose fracture    3x  . OCD (obsessive compulsive disorder)   . Stroke  [facility])    "mini stroke, that's what gave me the tremors"  . Stuttering   . Tremor, essential   . Vertigo    random  . Wears dentures    full upper  . Wears glasses     Patient Active Problem List   Diagnosis Date Noted  . CVA (cerebral vascular accident) ([place]) 12/06/2019  . Gait abnormality 11/12/2019  . Tremor, essential 11/12/2019  . Health care maintenance 10/04/2018  . Thumb pain 10/04/2018  . Memory change 12/17/2017  . Paresthesia 12/03/2017  . Lightheaded 08/28/2017  . Encounter for chronic pain management 06/18/2017  . Neck pain 05/14/2017  . Panic 01/10/2017  . Dysuria 03/29/2016  . Muscle ache 10/05/2015  . Syncope 07/08/2015  . Chronic back pain 04/12/2015  . Medicare annual wellness visit, initial 03/08/2015  . Advance care planning 03/08/2015  . Dysgeusia 11/28/2014  . Anxiety state 10/27/2014  . Diabetic polyneuropathy ([place]) 02/14/2014  . Rhinitis, chronic 01/13/2012  . Basal cell carcinoma   . LUMBAR RADICULOPATHY 10/16/2010  . HLD (hyperlipidemia) 05/13/2008  . Abdominal pain 08/11/2007  . Uncontrolled type 2 diabetes mellitus with peripheral neuropathy ([place]) 05/26/2007  . Essential hypertension 05/26/2007  . SCOLIOSIS 05/26/2007  . Tremor 05/26/2007  . COLONIC POLYPS, HX OF 05/26/2007    Past Surgical History:  Procedure Laterality Date  . APPENDECTOMY  1960  . BACK SURGERY  2012   lumbar surgery  . CATARACT EXTRACTION W/PHACO Right 04/12/2019   Procedure: CATARACT EXTRACTION PHACO AND INTRAOCULAR LENS PLACEMENT (IOC) RIGHT, DIABETIC;  Surgeon: [name], MD;  Location: [place];  Service: Ophthalmology;  Laterality: Right;  Diabetic - insulin and oral meds  . CATARACT EXTRACTION W/PHACO Left 05/10/2019   Procedure: CATARACT EXTRACTION PHACO AND INTRAOCULAR LENS PLACEMENT (IOC)  LEFT DIABETIC  00:25.6  14.4%  3.75;  Surgeon: [name], MD;  Location: [place];  Service: Ophthalmology;  Laterality: Left;   Diabetic - insulin and oral meds  . CERVICAL FUSION  1990  . CERVICAL FUSION  8/12   multiple levels with plates  . CHOLESTEATOMA EXCISION  1993  . COLLATERAL LIGAMENT REPAIR, ELBOW     bilateral, nerve improvement left 08/04, right 09/04  . COLONOSCOPY    . CYST EXCISION     back of cervical spine total of 5 surgeries  . EXTERNAL EAR SURGERY     multiple ear surguries as a child  . OTHER SURGICAL HISTORY     benign head tumor#5  . OTHER SURGICAL HISTORY     sebaceous cysts-post neck x5  . SHOULDER ARTHROSCOPY W/ ACROMIAL REPAIR  08/2004   left shoulder  . TONSILLECTOMY    . ULNAR NERVE TRANSPOSITION Right 05/13/2013   Procedure: RIGHT ULNAR NERVE DECOMPRESSION/TRANSPOSITION;  Surgeon: [name]., MD;  Location: [place];  Service: Orthopedics;  Laterality: Right;    Prior to Admission medications   Medication Sig Start Date End Date Taking? Authorizing Provider  ALPRAZolam (XANAX) 0.5 MG tablet TAKE 1/2 TO 1 TABLET TWICE DAILY IF NEEDED FOR ANXIETY Patient taking differently: Take 0.5-1 mg by mouth 3 (three) times daily.  09/17/19  Yes [name], MD  canagliflozin [facility]) 300 MG TABS tablet Take 1 tablet (300 mg total) by mouth daily before breakfast. 02/03/18  Yes Philemon Kingdom, MD  glipiZIDE (GLUCOTROL XL) 10 MG 24 hr tablet Take 10 mg by mouth daily with breakfast.   Yes [provider]  Insulin Glargine (LANTUS SOLOSTAR) 100 UNIT/ML Solostar Pen Inject under skin up to 30-50 UNITS DAILY AS DIRECTED Patient taking differently: Inject 50 Units into the skin at bedtime. Inject under skin up to 30-50 UNITS DAILY AS DIRECTED 09/16/19  Yes [name], MD  irbesartan (AVAPRO) 150 MG tablet Take 150 mg by mouth daily.   Yes [provider]  metFORMIN (GLUCOPHAGE-XR) 500 MG 24 hr tablet TAKE ONE TABLET 3 TIMES DAILY 07/03/18  Yes Philemon Kingdom, MD  oxyCODONE-acetaminophen (PERCOCET/ROXICET) 5-325 MG tablet TAKE ONE TO TWO  TABLETS BY MOUTH 3 TIMES DAILY IF NEEDED FOR PAIN 11/17/19  Yes [name], MD  propranolol ER (INDERAL LA) 60 MG 24 hr capsule TAKE 1 CAPSULE EVERY DAY 09/24/19  Yes [name], MD  tiZANidine (ZANAFLEX) 4 MG tablet Take 1 tablet (4 mg total) by mouth every 8 (eight) hours as needed for muscle spasms. With an extra dose daily as needed. 01/12/19  Yes [name], MD  traZODone (DESYREL) 50 MG tablet TAKE 1 AND 1/2 TABLETS BY MOUTH AT BEDTIME AS NEEDED FOR SLEEP Patient taking differently: Take 50 mg by mouth at bedtime. TAKE 1 AND 1/2 TABLETS BY MOUTH AT BEDTIME AS NEEDED FOR SLEEP 11/17/19  Yes [name], MD  Insulin Pen Needle (PEN NEEDLES) 32G X 4 MM MISC Inject 1 application as directed daily.  03/25/18   [name], MD    Allergies Bee venom, Pravastatin, Ace inhibitors, Actos [pioglitazone], Angiotensin receptor blockers, Aripiprazole, Crestor [rosuvastatin calcium], Metformin and related, Olanzapine, Pneumovax 23 [pneumococcal vac polyvalent], Primidone, Codeine, Divalproex sodium, Lithium carbonate, and Sulfamethoxazole-trimethoprim  Family History  Problem Relation Age of Onset  . OCD Mother   . Bipolar disorder Mother   . Cancer Mother   . 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 Arthritis Sister   . OCD Sister   . Colon cancer Neg Hx   . Prostate cancer Neg Hx     Social History Social History   Tobacco Use  . Smoking status: Former Smoker    Packs/day: 0.50    Years: 40.00    Pack years: 20.00    Types: Pipe    Quit date: 06/09/2016    Years since quitting: 3.4  . Smokeless tobacco: Never Used  Substance Use Topics  . Alcohol use: No    Alcohol/week: 0.0 standard drinks  . Drug use: No    Review of Systems Unable to obtain secondary to altered mental status.  ____________________________________________   PHYSICAL EXAM:  VITAL SIGNS: ED Triage Vitals [12/06/19 1710]  Enc Vitals Group     BP (!) 167/116      Pulse Rate 95     Resp 20     Temp (!) 97.5 F (36.4 C)     Temp Source Oral     SpO2 100 %     Weight 183 lb (83 kg)     Height 5\' 8"  (1.727 m)     Head Circumference      Peak Flow      Pain Score      Pain Loc      Pain Edu?      Excl. in [place]?    Constitutional: Alert and oriented. Eyes: Conjunctivae are normal. Head: Atraumatic. Nose: No congestion/rhinnorhea. Mouth/Throat: Mucous membranes are moist. Neck: Normal ROM Cardiovascular: Normal rate, regular rhythm. Grossly normal heart sounds. Respiratory: Normal respiratory effort.  No retractions. Lungs CTAB. Gastrointestinal: Soft and nontender. No distention. Genitourinary: deferred Musculoskeletal: No lower extremity tenderness nor edema. Neurologic: Stuttering speech pattern with word finding difficulties. No gross focal neurologic deficits are appreciated.  5-5 strength in bilateral upper and lower extremities, no plantar drift.  Tremulous in all extremities. Skin:  Skin is warm, dry and intact. No rash noted. Psychiatric: Mood and affect are normal.  ____________________________________________   LABS (all labs ordered are listed, but only abnormal results are displayed)  Labs Reviewed  GLUCOSE, CAPILLARY - Abnormal; Notable for the following components:      Result Value   Glucose-Capillary 161 (*)    All other components within normal limits  BASIC METABOLIC PANEL - Abnormal; Notable for the following components:   Glucose, Bld 135 (*)    All other components within normal limits  SARS CORONAVIRUS 2 (TAT 6-24 HRS)  CBC  GLUCOSE, CAPILLARY  HEMOGLOBIN A1C  HEMOGLOBIN A1C  LIPID PANEL  CBC  COMPREHENSIVE METABOLIC PANEL  TROPONIN I (HIGH SENSITIVITY)  TROPONIN I (HIGH SENSITIVITY)   ____________________________________________  EKG  ED ECG REPORT I, Blake Divine, the attending physician, personally viewed and interpreted this ECG.   Date: 12/06/2019  EKG Time: 17:46  Rate: 88  Rhythm: normal  sinus rhythm  Axis: LAD  Intervals:none  ST&T Change: None   PROCEDURES  Procedure(s) performed (including Critical Care):  Procedures  ____________________________________________   INITIAL IMPRESSION / ASSESSMENT AND PLAN / ED COURSE       74 year old male presents to the ED with change in behavior and speech pattern about 9 days ago, had outpatient CT scan through his PCPs office that was positive for left MCA stroke prior to arrival in the ED.  He does have a stuttering speech pattern with word finding difficulties here, appears very anxious and is due for his usual anxiety medication.  CT scan appears consistent with subacute left MCA stroke, explaining his symptoms.  He is not a candidate for TPA or endovascular intervention.  Lab work is pending at this time, plan to load with aspirin and admit for further stroke work-up.  Lab work unremarkable. Unfortunately patient became increasingly agitated and uncooperative, removing monitors and attempting to remove IV.  He was medicated with droperidol to ensure his safety and appropriate monitoring.  Case discussed with hospitalist, who accepts patient for admission.      ____________________________________________   FINAL CLINICAL IMPRESSION(S) / ED DIAGNOSES  Final diagnoses:  Cerebrovascular accident (CVA), unspecified mechanism ([place])  Aphasia  Cognitive and behavioral changes     ED Discharge Orders    None       Note:  This document was prepared using Dragon voice recognition software and may include unintentional dictation errors.   Blake Divine, MD 12/06/19 2330

## 2019-12-06 NOTE — ED Notes (Signed)
CBG 161 

## 2019-12-06 NOTE — H&P (Addendum)
[facility] at [facility] NAME: Walter Harris    MR#:  [id number]  DATE OF BIRTH:  Jul 06, 1946  DATE OF ADMISSION:  12/06/2019  PRIMARY CARE PHYSICIAN: Walter Ghent, MD   REQUESTING/REFERRING PHYSICIAN: Blake Divine, MD CHIEF COMPLAINT:   Chief Complaint  Patient presents with  . Memory Loss    HISTORY OF PRESENT ILLNESS:  Walter Harris  is a 74 y.o. Caucasian male with a known history of type diabetes mellitus, hypertension, dyslipidemia and hearing impairment, who presented to the emergency room with acute onset of abnormal speech with expressive dysphasia and stuttering that started on Saturday and apparently was seen by his primary care physician today.  Head CT scan was ordered and revealed left MCA territory infarction.  The patient follows minimal commands and when asked any question he answers "I do not know".  He was therefore very poor historian.  He is on aspirin but it is unclear if he has been taking it regularly.  Upon presentation to the emergency room, blood pressure was 167/116 with otherwise normal vital signs.  BMP and CBC were unremarkable.  High-sensitivity troponin I was 6. EKG showed normal sinus rhythm with rate of 88 with left axis deviation, T wave inversion in V1 and poor R wave progression.  Noncontrast head CT scan revealed acute versus subacute left MCA territory infarction involving the left frontal lobe with no acute intracranial normalities.  It showed atrophy and chronic microvascular ischemic changes.  The patient was given 0.5 mg p.o. Xanax, 4 baby aspirin and 1 mg of IV Ativan.  He will be admitted to an observation medically monitored bed for further evaluation and management. PAST MEDICAL HISTORY:   Past Medical History:  Diagnosis Date  . Basal cell carcinoma    multiple  . Cataracts, both eyes 2019   surgery pending April 2020  . Complication of anesthesia    post-operative cognitive dysfunction after ACDF on 05/01/11  [facility])  . Deaf    right ear  . Diabetes mellitus   . DJD (degenerative joint disease)    lumbar spine  . Gait abnormality 11/12/2019  . Hearing impaired    hearing aids-transmitter rt   . History of blood transfusion    as a baby in 3  . History of colonic polyps   . Hyperlipidemia   . Hypertension    pt. denies high blood pressure  . Kidney stone 1967  . Memory change 12/17/2017  . Nose fracture    3x  . OCD (obsessive compulsive disorder)   . Stroke [facility])    "mini stroke, that's what gave me the tremors"  . Stuttering   . Tremor, essential   . Vertigo    random  . Wears dentures    full upper  . Wears glasses     PAST SURGICAL HISTORY:   Past Surgical History:  Procedure Laterality Date  . APPENDECTOMY  1960  . BACK SURGERY  2012   lumbar surgery  . CATARACT EXTRACTION W/PHACO Right 04/12/2019   Procedure: CATARACT EXTRACTION PHACO AND INTRAOCULAR LENS PLACEMENT (IOC) RIGHT, DIABETIC;  Surgeon: Walter Bear, MD;  Location: [place];  Service: Ophthalmology;  Laterality: Right;  Diabetic - insulin and oral meds  . CATARACT EXTRACTION W/PHACO Left 05/10/2019   Procedure: CATARACT EXTRACTION PHACO AND INTRAOCULAR LENS PLACEMENT (IOC)  LEFT DIABETIC  00:25.6  14.4%  3.75;  Surgeon: Walter Bear, MD;  Location: [place];  Service: Ophthalmology;  Laterality: Left;  Diabetic - insulin and oral meds  . CERVICAL FUSION  1990  . CERVICAL FUSION  8/12   multiple levels with plates  . CHOLESTEATOMA EXCISION  1993  . COLLATERAL LIGAMENT REPAIR, ELBOW     bilateral, nerve improvement left 08/04, right 09/04  . COLONOSCOPY    . CYST EXCISION     back of cervical spine total of 5 surgeries  . EXTERNAL EAR SURGERY     multiple ear surguries as a child  . OTHER SURGICAL HISTORY     benign head tumor#5  . OTHER SURGICAL HISTORY     sebaceous cysts-post neck x5  . SHOULDER ARTHROSCOPY W/ ACROMIAL REPAIR  08/2004   left shoulder  . TONSILLECTOMY     . ULNAR NERVE TRANSPOSITION Right 05/13/2013   Procedure: RIGHT ULNAR NERVE DECOMPRESSION/TRANSPOSITION;  Surgeon: Walter Harris., MD;  Location: [place];  Service: Orthopedics;  Laterality: Right;    SOCIAL HISTORY:   Social History   Tobacco Use  . Smoking status: Former Smoker    Packs/day: 0.50    Years: 40.00    Pack years: 20.00    Types: Pipe    Quit date: 06/09/2016    Years since quitting: 3.4  . Smokeless tobacco: Never Used  Substance Use Topics  . Alcohol use: No    Alcohol/week: 0.0 standard drinks    FAMILY HISTORY:   Family History  Problem Relation Age of Onset  . OCD Mother   . Bipolar disorder Mother   . Cancer Mother   . 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 Arthritis Sister   . OCD Sister   . Colon cancer Neg Hx   . Prostate cancer Neg Hx     DRUG ALLERGIES:   Allergies  Allergen Reactions  . Bee Venom Anaphylaxis  . Pravastatin Other (See Comments)    Severe myalgias  . Ace Inhibitors     Cough  . Actos [Pioglitazone] Other (See Comments)    Intolerant, not allergic.   . Angiotensin Receptor Blockers Other (See Comments)    cramps  . Aripiprazole Other (See Comments)    Unknown reaction many years ago  . Crestor [Rosuvastatin Calcium] Other (See Comments)    Aches, even with twice weekly dosing  . Metformin And Related Other (See Comments)    Able to tolerate XR formulation, not allergic.   . Olanzapine Other (See Comments)    Unknown reaction many years ago  . Pneumovax 23 [Pneumococcal Vac Polyvalent] Other (See Comments)    aches  . Primidone Other (See Comments)    Vertigo at 50mg , able to tolerate 25mg    . Codeine Rash  . Divalproex Sodium Other (See Comments)     tremor  . Lithium Carbonate Other (See Comments)    Tremors   . Sulfamethoxazole-Trimethoprim Other (See Comments)    Mild reaction per Dr. Silvio Pate, pt does not remember the reaction    REVIEW OF SYSTEMS:   ROS  As per history of present illness. All pertinent systems were reviewed above. Constitutional,  HEENT, cardiovascular, respiratory, GI, GU, musculoskeletal, neuro, psychiatric, endocrine,  integumentary and hematologic systems were reviewed and are otherwise  negative/unremarkable except for positive findings mentioned above in the HPI.   MEDICATIONS AT HOME:   Prior to Admission medications   Medication Sig Start Date End Date Taking? Authorizing Provider  ALPRAZolam (XANAX) 0.5 MG tablet TAKE 1/2 TO  1 TABLET TWICE DAILY IF NEEDED FOR ANXIETY 09/17/19   Walter Ghent, MD  canagliflozin [facility]) 300 MG TABS tablet Take 1 tablet (300 mg total) by mouth daily before breakfast. 02/03/18   Philemon Kingdom, MD  glipiZIDE (GLUCOTROL XL) 2.5 MG 24 hr tablet TAKE THREE TABLETS EVERY DAY BEFORE BREAKFAST 08/30/19   Philemon Kingdom, MD  Insulin Glargine (LANTUS SOLOSTAR) 100 UNIT/ML Solostar Pen Inject under skin up to 30-50 UNITS DAILY AS DIRECTED 09/16/19   Walter Ghent, MD  Insulin Pen Needle (PEN NEEDLES) 32G X 4 MM MISC Inject 1 application as directed daily. 03/25/18   Walter Ghent, MD  metFORMIN (GLUCOPHAGE-XR) 500 MG 24 hr tablet TAKE ONE TABLET 3 TIMES DAILY 07/03/18   Philemon Kingdom, MD  oxyCODONE-acetaminophen (PERCOCET/ROXICET) 5-325 MG tablet TAKE ONE TO TWO TABLETS BY MOUTH 3 TIMES DAILY IF NEEDED FOR PAIN 11/17/19   Walter Ghent, MD  propranolol ER (INDERAL LA) 60 MG 24 hr capsule TAKE 1 CAPSULE EVERY DAY 09/24/19   Walter Ghent, MD  tiZANidine (ZANAFLEX) 4 MG tablet Take 1 tablet (4 mg total) by mouth every 8 (eight) hours as needed for muscle spasms. With an extra dose daily as needed. 01/12/19   Walter Ghent, MD  traZODone (DESYREL) 50 MG tablet TAKE 1 AND 1/2 TABLETS BY MOUTH AT BEDTIME AS NEEDED FOR SLEEP 11/17/19   Walter Ghent, MD      VITAL SIGNS:  Blood pressure (!) 180/90, pulse 73, temperature (!) 97.5 F (36.4 C), temperature source Oral, resp. rate  (!) 25, height 5\' 8"  (1.727 m), weight 83 kg, SpO2 96 %.  PHYSICAL EXAMINATION:  Physical Exam  GENERAL:  74 y.o.-year-old Caucasian male patient lying in the bed with no acute distress.  EYES: Pupils equal, round, reactive to light and accommodation. No scleral icterus. Extraocular muscles intact.  HEENT: Head atraumatic, normocephalic. Oropharynx and nasopharynx clear.  NECK:  Supple, no jugular venous distention. No thyroid enlargement, no tenderness.  LUNGS: Normal breath sounds bilaterally, no wheezing, rales,rhonchi or crepitation. No use of accessory muscles of respiration.  CARDIOVASCULAR: Regular rate and rhythm, S1, S2 normal. No murmurs, rubs, or gallops.  ABDOMEN: Soft, nondistended, nontender. Bowel sounds present. No organomegaly or mass.  EXTREMITIES: No pedal edema, cyanosis, or clubbing.  NEUROLOGIC: Cranial nerves II through XII are intact except for expressive dysphasia. Muscle strength 5/5 in all extremities. Sensation intact. Gait not checked.  PSYCHIATRIC: The patient is alert and oriented x 3.  Normal affect and good eye contact. SKIN: No obvious rash, lesion, or ulcer.   LABORATORY PANEL:   CBC Recent Labs  Lab 12/06/19 1747  WBC 7.9  HGB 15.7  HCT 46.7  PLT 213   ------------------------------------------------------------------------------------------------------------------  Chemistries  Recent Labs  Lab 12/06/19 1747  NA 137  K 4.3  CL 101  CO2 23  GLUCOSE 135*  BUN 22  CREATININE 1.19  CALCIUM 9.5   ------------------------------------------------------------------------------------------------------------------  Cardiac Enzymes No results for input(s): TROPONINI in the last 168 hours. ------------------------------------------------------------------------------------------------------------------  RADIOLOGY:  DG Chest 2 View  Result Date: 12/06/2019 CLINICAL DATA:  Worsening altered level of consciousness EXAM: CHEST - 2 VIEW  COMPARISON:  05/02/2014 FINDINGS: The heart size and mediastinal contours are within normal limits. Both lungs are clear. The visualized skeletal structures are unremarkable. IMPRESSION: No active cardiopulmonary disease. Electronically Signed   By: Randa Ngo M.D.   On: 12/06/2019 18:39   CT HEAD WO CONTRAST  Addendum Date: 12/06/2019  ADDENDUM REPORT: 12/06/2019 16:40 ADDENDUM: These results were called by telephone at the time of interpretation on 12/06/2019 at 4:40 pm to provider Harrel Lemon , who verbally acknowledged these results. Electronically Signed   By: Constance Holster M.D.   On: 12/06/2019 16:40   Result Date: 12/06/2019 CLINICAL DATA:  Altered mental status. EXAM: CT HEAD WITHOUT CONTRAST TECHNIQUE: Contiguous axial images were obtained from the base of the skull through the vertex without intravenous contrast. COMPARISON:  MRI dated October 06, 2017. FINDINGS: Brain: There is a hypoattenuating area in the left frontal lobe measuring approximately 2.6 cm there is no acute intracranial hemorrhage. Atrophy and chronic microvascular ischemic changes are noted. Vascular: No hyperdense vessel or unexpected calcification. Skull: Normal. Negative for fracture or focal lesion. Sinuses/Orbits: No acute finding. Other: None. IMPRESSION: 1. Findings concerning for an acute or subacute left MCA territory infarct involving the left frontal lobe as detailed above. 2. No acute intracranial hemorrhage. 3. Atrophy and chronic microvascular ischemic changes are noted. Electronically Signed: By: Constance Holster M.D. On: 12/06/2019 16:31      IMPRESSION AND PLAN:   1.  Subacute stroke with left MCA territory cerebral infarction and subsequent expressive dysphasia. -The patient will be admitted to an observation medical monitored bed. -We will obtain 2D echo with bubble study as well as bilateral carotid Doppler. -We will restart aspirin since she has been off of it for a long time.. -We will  continue Zetia and check fasting lipids in a.m. -A neurology consultation will be obtained in a.m.  I notified Dr. Doy Mince about the patient. -PT/OT and speech therapy consults will be obtained. -Meloxicam will be held off.  2.  Type 2 diabetes mellitus. -The patient will be placed on supplement coverage with NovoLog and will continue basal coverage. -We will hold Metformin and continue Glucotrol XL and Invokana.  3.  Hypertension. -We will continue his antihypertensives.  4.  Dyslipidemia. -We will continue Zetia.  The patient has statin allergy.  5.  DVT prophylaxis. -Subcutaneous Lovenox.   All the records are reviewed and case discussed with ED provider. The plan of care was discussed in details with the patient (and family). I answered all questions. The patient agreed to proceed with the above mentioned plan. Further management will depend upon hospital course.   CODE STATUS: I discussed the CODE STATUS with the patient's wife and she clearly indicated the patient is DNR/DNI.  TOTAL TIME TAKING CARE OF THIS PATIENT: 55 minutes.    Christel Mormon M.D on 12/06/2019 at 7:47 PM  [facility]   From 7 PM-7 AM, contact night-coverage [email or web address]  CC: Primary care physician; Walter Ghent, MD   Note: This dictation was prepared with Dragon dictation along with smaller phrase technology. Any transcriptional errors that result from this process are unintentional.

## 2019-12-06 NOTE — ED Notes (Signed)
Pt brought in by wife from home with altered mental status for 1 week.  Worsening today after mowing the lawn.  Pt is yelling,cursing and tearful.  Consoled by wife easily.  Pt denies h/a, falls, no sob or chest pain.  Pt confused to time and place.  Iv started and ekg done.

## 2019-12-07 ENCOUNTER — Encounter: Payer: Self-pay | Admitting: Family Medicine

## 2019-12-07 ENCOUNTER — Observation Stay: Payer: PPO

## 2019-12-07 ENCOUNTER — Observation Stay (HOSPITAL_COMMUNITY)
Admit: 2019-12-07 | Discharge: 2019-12-07 | Disposition: A | Discharge status: Home / Self Care | Payer: PPO | Attending: Family Medicine | Admitting: Family Medicine

## 2019-12-07 DIAGNOSIS — H9190 Unspecified hearing loss, unspecified ear: Secondary | ICD-10-CM | POA: Diagnosis present

## 2019-12-07 DIAGNOSIS — I10 Essential (primary) hypertension: Secondary | ICD-10-CM | POA: Diagnosis present

## 2019-12-07 DIAGNOSIS — I6389 Other cerebral infarction: Secondary | ICD-10-CM | POA: Diagnosis not present

## 2019-12-07 DIAGNOSIS — R29707 NIHSS score 7: Secondary | ICD-10-CM | POA: Diagnosis present

## 2019-12-07 DIAGNOSIS — Z8673 Personal history of transient ischemic attack (TIA), and cerebral infarction without residual deficits: Secondary | ICD-10-CM | POA: Diagnosis not present

## 2019-12-07 DIAGNOSIS — E119 Type 2 diabetes mellitus without complications: Secondary | ICD-10-CM | POA: Diagnosis not present

## 2019-12-07 DIAGNOSIS — E785 Hyperlipidemia, unspecified: Secondary | ICD-10-CM | POA: Diagnosis present

## 2019-12-07 DIAGNOSIS — I63512 Cerebral infarction due to unspecified occlusion or stenosis of left middle cerebral artery: Secondary | ICD-10-CM | POA: Diagnosis present

## 2019-12-07 DIAGNOSIS — R4701 Aphasia: Secondary | ICD-10-CM | POA: Diagnosis present

## 2019-12-07 DIAGNOSIS — Z20822 Contact with and (suspected) exposure to covid-19: Secondary | ICD-10-CM | POA: Diagnosis present

## 2019-12-07 DIAGNOSIS — Z9841 Cataract extraction status, right eye: Secondary | ICD-10-CM | POA: Diagnosis not present

## 2019-12-07 DIAGNOSIS — R29701 NIHSS score 1: Secondary | ICD-10-CM | POA: Diagnosis not present

## 2019-12-07 DIAGNOSIS — I160 Hypertensive urgency: Secondary | ICD-10-CM | POA: Diagnosis present

## 2019-12-07 DIAGNOSIS — Z981 Arthrodesis status: Secondary | ICD-10-CM | POA: Diagnosis not present

## 2019-12-07 DIAGNOSIS — Z9842 Cataract extraction status, left eye: Secondary | ICD-10-CM | POA: Diagnosis not present

## 2019-12-07 DIAGNOSIS — I63232 Cerebral infarction due to unspecified occlusion or stenosis of left carotid arteries: Secondary | ICD-10-CM | POA: Diagnosis not present

## 2019-12-07 DIAGNOSIS — G250 Essential tremor: Secondary | ICD-10-CM | POA: Diagnosis present

## 2019-12-07 DIAGNOSIS — I639 Cerebral infarction, unspecified: Secondary | ICD-10-CM | POA: Diagnosis not present

## 2019-12-07 DIAGNOSIS — F429 Obsessive-compulsive disorder, unspecified: Secondary | ICD-10-CM | POA: Diagnosis present

## 2019-12-07 DIAGNOSIS — R29702 NIHSS score 2: Secondary | ICD-10-CM | POA: Diagnosis not present

## 2019-12-07 DIAGNOSIS — Z961 Presence of intraocular lens: Secondary | ICD-10-CM | POA: Diagnosis present

## 2019-12-07 DIAGNOSIS — R4782 Fluency disorder in conditions classified elsewhere: Secondary | ICD-10-CM | POA: Diagnosis present

## 2019-12-07 DIAGNOSIS — I6522 Occlusion and stenosis of left carotid artery: Secondary | ICD-10-CM | POA: Diagnosis not present

## 2019-12-07 DIAGNOSIS — Z87442 Personal history of urinary calculi: Secondary | ICD-10-CM | POA: Diagnosis not present

## 2019-12-07 DIAGNOSIS — R4702 Dysphasia: Secondary | ICD-10-CM | POA: Diagnosis present

## 2019-12-07 DIAGNOSIS — Z85828 Personal history of other malignant neoplasm of skin: Secondary | ICD-10-CM | POA: Diagnosis not present

## 2019-12-07 DIAGNOSIS — R29703 NIHSS score 3: Secondary | ICD-10-CM | POA: Diagnosis not present

## 2019-12-07 DIAGNOSIS — Z66 Do not resuscitate: Secondary | ICD-10-CM | POA: Diagnosis present

## 2019-12-07 LAB — COMPREHENSIVE METABOLIC PANEL
ALT: 20 U/L (ref 0–44)
AST: 19 U/L (ref 15–41)
Albumin: 3.6 g/dL (ref 3.5–5.0)
Alkaline Phosphatase: 58 U/L (ref 38–126)
Anion gap: 10 (ref 5–15)
BUN: 18 mg/dL (ref 8–23)
CO2: 23 mmol/L (ref 22–32)
Calcium: 8.9 mg/dL (ref 8.9–10.3)
Chloride: 108 mmol/L (ref 98–111)
Creatinine, Ser: 0.99 mg/dL (ref 0.61–1.24)
GFR calc Af Amer: >60 mL/min (ref >60)
GFR calc non Af Amer: >60 mL/min (ref >60)
Glucose, Bld: 85 mg/dL (ref 70–99)
Potassium: 3.7 mmol/L (ref 3.5–5.1)
Sodium: 141 mmol/L (ref 135–145)
Total Bilirubin: 0.9 mg/dL (ref 0.3–1.2)
Total Protein: 6.5 g/dL (ref 6.5–8.1)

## 2019-12-07 LAB — CBC
HCT: 43.5 % (ref 39.0–52.0)
Hemoglobin: 14.8 g/dL (ref 13.0–17.0)
MCH: 29.9 pg (ref 26.0–34.0)
MCHC: 34 g/dL (ref 30.0–36.0)
MCV: 87.9 fL (ref 80.0–100.0)
Platelets: 181 10*3/uL (ref 150–400)
RBC: 4.95 MIL/uL (ref 4.22–5.81)
RDW: 12.3 % (ref 11.5–15.5)
WBC: 6 10*3/uL (ref 4.0–10.5)
nRBC: 0 % (ref 0.0–0.2)

## 2019-12-07 LAB — LIPID PANEL
Cholesterol: 169 mg/dL (ref 0–200)
HDL: 30 mg/dL — ABNORMAL LOW (ref >40)
LDL Cholesterol: 104 mg/dL — ABNORMAL HIGH (ref 0–99)
Total CHOL/HDL Ratio: 5.6 RATIO
Triglycerides: 177 mg/dL — ABNORMAL HIGH (ref <150)
VLDL: 35 mg/dL (ref 0–40)

## 2019-12-07 LAB — GLUCOSE, CAPILLARY
Glucose-Capillary: 116 mg/dL — ABNORMAL HIGH (ref 70–99)
Glucose-Capillary: 111 mg/dL — ABNORMAL HIGH (ref 70–99)
Glucose-Capillary: 151 mg/dL — ABNORMAL HIGH (ref 70–99)
Glucose-Capillary: 112 mg/dL — ABNORMAL HIGH (ref 70–99)

## 2019-12-07 LAB — HEMOGLOBIN A1C
Hgb A1c MFr Bld: 9.6 % — ABNORMAL HIGH (ref 4.8–5.6)
Hgb A1c MFr Bld: 9.6 % — ABNORMAL HIGH (ref 4.8–5.6)
Mean Plasma Glucose: 228.82 mg/dL
Mean Plasma Glucose: 228.82 mg/dL

## 2019-12-07 LAB — ECHOCARDIOGRAM COMPLETE
Height: 68 in
Weight: 2895.96 oz

## 2019-12-07 LAB — SARS CORONAVIRUS 2 (TAT 6-24 HRS): SARS Coronavirus 2: NEGATIVE

## 2019-12-07 MED ORDER — HALOPERIDOL LACTATE 5 MG/ML IJ SOLN
2.0000 mg | Freq: Four times a day (QID) | INTRAMUSCULAR | Status: DC | PRN
  Administered 2019-12-08: 2 mg via INTRAVENOUS
  Filled 2019-12-07: qty 1

## 2019-12-07 MED ORDER — ASPIRIN EC 81 MG PO TBEC
81.0000 mg | DELAYED_RELEASE_TABLET | Freq: Every day | ORAL | Status: DC
  Administered 2019-12-08 – 2019-12-09 (×2): 81 mg via ORAL
  Filled 2019-12-07 (×2): qty 1

## 2019-12-07 MED ORDER — ALPRAZOLAM 0.25 MG PO TABS
0.2500 mg | ORAL_TABLET | Freq: Three times a day (TID) | ORAL | Status: DC
  Administered 2019-12-07 – 2019-12-09 (×6): 0.25 mg via ORAL
  Filled 2019-12-07 (×6): qty 1

## 2019-12-07 MED ORDER — CLOPIDOGREL BISULFATE 75 MG PO TABS
75.0000 mg | ORAL_TABLET | Freq: Every day | ORAL | Status: DC
  Administered 2019-12-08 – 2019-12-09 (×2): 75 mg via ORAL
  Filled 2019-12-07 (×2): qty 1

## 2019-12-07 MED ORDER — OXYCODONE-ACETAMINOPHEN 5-325 MG PO TABS: 1.0000 | ORAL_TABLET | Freq: Three times a day (TID) | ORAL | Status: DC | PRN

## 2019-12-07 NOTE — Progress Notes (Signed)
OT Cancellation Note  Patient Details Name: Walter Harris MRN: [id number] DOB: 04/26/1946   Cancelled Treatment:    Reason Eval/Treat Not Completed: Patient at procedure or test/ unavailable. Thank you for the OT consult. Order received and chart reviewed. Upon arrival to pt room, pt with DO in room for assessment. Will hold OT evaluation at this time and initiate services as available and pt medically appropriate for therapy.   Shara Blazing, M.S., OTR/L Ascom: [phone] 12/07/19, 9:55 AM    Walter Harris 12/07/2019, 9:54 AM

## 2019-12-07 NOTE — Progress Notes (Signed)
patient again with agitation and inability to redirect per nursing report.  Sitter reordered  Haldol ordered for severe agitation. Cardiac monitoring in place

## 2019-12-07 NOTE — Consult Note (Addendum)
Requesting Physician: Arbutus Ped    Chief Complaint: Difficulty with speech  I have been asked by Dr. Arbutus Ped to see this patient in consultation for stroke.  HPI: Walter Harris is an 74 y.o. male with a known history of type diabetes mellitus, hypertension, dyslipidemia and hearing impairment, who presented to the emergency room with acute onset of abnormal speech.  Per wife she began to notice a change in his speech as of 3/19.  On that day he had gradual worsening of his speech.  Was unable to get an appointment with his PCP until 3/26.  During that period of time speech was intermittently affected.  Has had no motor complaints.  Initial NIHSS of 7. Consult called for further recommendations.    Date last known well: 11/26/2019 Time last known well: Unable to determine tPA Given: No: Unknown LKW  Past Medical History:  Diagnosis Date  . Basal cell carcinoma    multiple  . Cataracts, both eyes 2019   surgery pending April 2020  . Complication of anesthesia    post-operative cognitive dysfunction after ACDF on 05/01/11 [facility])  . Deaf    right ear  . Diabetes mellitus   . DJD (degenerative joint disease)    lumbar spine  . Gait abnormality 11/12/2019  . Hearing impaired    hearing aids-transmitter rt   . History of blood transfusion    as a baby in 51  . History of colonic polyps   . Hyperlipidemia   . Hypertension    pt. denies high blood pressure  . Kidney stone 1967  . Memory change 12/17/2017  . Nose fracture    3x  . OCD (obsessive compulsive disorder)   . Stroke [facility])    "mini stroke, that's what gave me the tremors"  . Stuttering   . Tremor, essential   . Vertigo    random  . Wears dentures    full upper  . Wears glasses     Past Surgical History:  Procedure Laterality Date  . APPENDECTOMY  1960  . BACK SURGERY  2012   lumbar surgery  . CATARACT EXTRACTION W/PHACO Right 04/12/2019   Procedure: CATARACT EXTRACTION PHACO AND INTRAOCULAR LENS PLACEMENT (IOC)  RIGHT, DIABETIC;  Surgeon: [name], MD;  Location: [place];  Service: Ophthalmology;  Laterality: Right;  Diabetic - insulin and oral meds  . CATARACT EXTRACTION W/PHACO Left 05/10/2019   Procedure: CATARACT EXTRACTION PHACO AND INTRAOCULAR LENS PLACEMENT (IOC)  LEFT DIABETIC  00:25.6  14.4%  3.75;  Surgeon: [name], MD;  Location: [place];  Service: Ophthalmology;  Laterality: Left;  Diabetic - insulin and oral meds  . CERVICAL FUSION  1990  . CERVICAL FUSION  8/12   multiple levels with plates  . CHOLESTEATOMA EXCISION  1993  . COLLATERAL LIGAMENT REPAIR, ELBOW     bilateral, nerve improvement left 08/04, right 09/04  . COLONOSCOPY    . CYST EXCISION     back of cervical spine total of 5 surgeries  . EXTERNAL EAR SURGERY     multiple ear surguries as a child  . OTHER SURGICAL HISTORY     benign head tumor#5  . OTHER SURGICAL HISTORY     sebaceous cysts-post neck x5  . SHOULDER ARTHROSCOPY W/ ACROMIAL REPAIR  08/2004   left shoulder  . TONSILLECTOMY    . ULNAR NERVE TRANSPOSITION Right 05/13/2013   Procedure: RIGHT ULNAR NERVE DECOMPRESSION/TRANSPOSITION;  Surgeon: [name]., MD;  Location:  [place];  Service: Orthopedics;  Laterality: Right;    Family History  Problem Relation Age of Onset  . OCD Mother   . Bipolar disorder Mother   . Cancer Mother   . 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 Arthritis Sister   . OCD Sister   . Colon cancer Neg Hx   . Prostate cancer Neg Hx    Social History:  reports that he quit smoking about 3 years ago. His smoking use included pipe. He has a 20.00 pack-year smoking history. He has never used smokeless tobacco. He reports that he does not drink alcohol or use drugs.  Allergies:  Allergies  Allergen Reactions  . Bee Venom Anaphylaxis  . Pravastatin Other (See Comments)    Severe myalgias  . Ace Inhibitors     Cough  . Actos [Pioglitazone]  Other (See Comments)    Intolerant, not allergic.   . Angiotensin Receptor Blockers Other (See Comments)    cramps  . Aripiprazole Other (See Comments)    Unknown reaction many years ago  . Crestor [Rosuvastatin Calcium] Other (See Comments)    Aches, even with twice weekly dosing  . Metformin And Related Other (See Comments)    Able to tolerate XR formulation, not allergic.   . Olanzapine Other (See Comments)    Unknown reaction many years ago  . Pneumovax 23 [Pneumococcal Vac Polyvalent] Other (See Comments)    aches  . Primidone Other (See Comments)    Vertigo at 50mg , able to tolerate 25mg    . Codeine Rash  . Divalproex Sodium Other (See Comments)     tremor  . Lithium Carbonate Other (See Comments)    Tremors   . Sulfamethoxazole-Trimethoprim Other (See Comments)    Mild reaction per Dr. Silvio Pate, pt does not remember the reaction    Medications:  I have reviewed the patient's current medications. Prior to Admission:  Medications Prior to Admission  Medication Sig Dispense Refill Last Dose  . ALPRAZolam (XANAX) 0.5 MG tablet TAKE 1/2 TO 1 TABLET TWICE DAILY IF NEEDED FOR ANXIETY (Patient taking differently: Take 0.5-1 mg by mouth 3 (three) times daily. ) 60 tablet 1 12/06/2019 at Unknown time  . canagliflozin (INVOKANA) 300 MG TABS tablet Take 1 tablet (300 mg total) by mouth daily before breakfast. 30 tablet 11 12/06/2019 at Unknown time  . glipiZIDE (GLUCOTROL XL) 10 MG 24 hr tablet Take 10 mg by mouth daily with breakfast.   12/06/2019 at Unknown time  . Insulin Glargine (LANTUS SOLOSTAR) 100 UNIT/ML Solostar Pen Inject under skin up to 30-50 UNITS DAILY AS DIRECTED (Patient taking differently: Inject 50 Units into the skin at bedtime. Inject under skin up to 30-50 UNITS DAILY AS DIRECTED)   12/05/2019 at Unknown time  . irbesartan (AVAPRO) 150 MG tablet Take 150 mg by mouth daily.   12/06/2019 at Unknown time  . metFORMIN (GLUCOPHAGE-XR) 500 MG 24 hr tablet TAKE ONE TABLET 3  TIMES DAILY 270 tablet 3 12/06/2019 at Unknown time  . oxyCODONE-acetaminophen (PERCOCET/ROXICET) 5-325 MG tablet TAKE ONE TO TWO TABLETS BY MOUTH 3 TIMES DAILY IF NEEDED FOR PAIN 180 tablet 0 prn at prn  . propranolol ER (INDERAL LA) 60 MG 24 hr capsule TAKE 1 CAPSULE EVERY DAY 90 capsule 0 12/06/2019 at Unknown time  . tiZANidine (ZANAFLEX) 4 MG tablet Take 1 tablet (4 mg total) by mouth every 8 (eight) hours as needed for muscle  spasms. With an extra dose daily as needed. 120 tablet 3 prn at prn  . traZODone (DESYREL) 50 MG tablet TAKE 1 AND 1/2 TABLETS BY MOUTH AT BEDTIME AS NEEDED FOR SLEEP (Patient taking differently: Take 50 mg by mouth at bedtime. TAKE 1 AND 1/2 TABLETS BY MOUTH AT BEDTIME AS NEEDED FOR SLEEP) 135 tablet 1 12/05/2019 at Unknown time  . Insulin Pen Needle (PEN NEEDLES) 32G X 4 MM MISC Inject 1 application as directed daily. 100 each 3    Scheduled: .  stroke: mapping our early stages of recovery book   Does not apply Once  . ALPRAZolam  0.25 mg Oral TID  . aspirin EC  325 mg Oral Daily  . canagliflozin  300 mg Oral QAC breakfast  . enoxaparin (LOVENOX) injection  40 mg Subcutaneous Q24H  . insulin aspart  0-9 Units Subcutaneous TID PC & HS  . propranolol ER  60 mg Oral Daily  . sodium chloride flush  3 mL Intravenous Once    ROS: Unable to obtain due to difficulty with speech and hearing  Physical Examination: Blood pressure 134/66, pulse 87, temperature 98.6 F (37 C), temperature source Oral, resp. rate 16, height 5\' 8"  (1.727 m), weight 82.1 kg, SpO2 95 %.  HEENT-  Normocephalic, no lesions, without obvious abnormality.  Normal external eye and conjunctiva.  Normal TM's bilaterally.  Normal auditory canals and external ears. Normal external nose, mucus membranes and septum.  Normal pharynx. Cardiovascular- S1, S2 normal, pulses palpable throughout   Lungs- chest clear, no wheezing, rales, normal symmetric air entry Abdomen- soft, non-tender; bowel sounds normal; no  masses,  no organomegaly Extremities- mild BLE edema Lymph-no adenopathy palpable Musculoskeletal-no joint tenderness, deformity or swelling Skin-warm and dry, no hyperpigmentation, vitiligo, or suspicious lesions  Neurological Examination   Mental Status: Alert.  Spontaneously able to get fluent sentences out at times.  At other times evidence of expressive aphasia noted.  Able to follow simple commands but unable to follow 3 step commands.  , Cranial Nerves: II: Visual fields grossly normal, pupils equal, round, reactive to light and accommodation III,IV, VI: ptosis not present, extra-ocular motions intact bilaterally V,VII: smile symmetric, facial light touch sensation normal bilaterally VIII: hearing decreased IX,X: gag reflex present XI: bilateral shoulder shrug XII: midline tongue extension Motor: Right : Upper extremity   5/5    Left:     Upper extremity   5/5  Lower extremity   5/5     Lower extremity   5/5 Tone and bulk:normal tone throughout; no atrophy noted Sensory: Pinprick and light touch intact throughout, bilaterally Deep Tendon Reflexes: Symmetric throughout Plantars: Right: mute   Left: mute Cerebellar: Unable to perform due to ability to follow commands.   Gait: not tested due to safety concerns    Laboratory Studies:  Basic Metabolic Panel: Recent Labs  Lab 12/06/19 1747 12/07/19 0429  NA 137 141  K 4.3 3.7  CL 101 108  CO2 23 23  GLUCOSE 135* 85  BUN 22 18  CREATININE 1.19 0.99  CALCIUM 9.5 8.9    Liver Function Tests: Recent Labs  Lab 12/07/19 0429  AST 19  ALT 20  ALKPHOS 58  BILITOT 0.9  PROT 6.5  ALBUMIN 3.6   No results for input(s): LIPASE, AMYLASE in the last 168 hours. No results for input(s): AMMONIA in the last 168 hours.  CBC: Recent Labs  Lab 12/06/19 1747 12/07/19 0429  WBC 7.9 6.0  HGB 15.7 14.8  HCT  46.7 43.5  MCV 88.4 87.9  PLT 213 181    Cardiac Enzymes: No results for input(s): CKTOTAL, CKMB, CKMBINDEX,  TROPONINI in the last 168 hours.  BNP: Invalid input(s): POCBNP  CBG: Recent Labs  Lab 12/06/19 1712 12/06/19 2210 12/07/19 0905  GLUCAP 161* 93 116*    Microbiology: Results for orders placed or performed during the hospital encounter of 12/06/19  SARS CORONAVIRUS 2 (TAT 6-24 HRS) Nasopharyngeal Nasopharyngeal Swab     Status: None   Collection Time: 12/06/19  5:47 PM   Specimen: Nasopharyngeal Swab  Result Value Ref Range Status   SARS Coronavirus 2 NEGATIVE NEGATIVE Final    Comment: (NOTE) SARS-CoV-2 target nucleic acids are NOT DETECTED. The SARS-CoV-2 RNA is generally detectable in upper and lower respiratory specimens during the acute phase of infection. Negative results do not preclude SARS-CoV-2 infection, do not rule out co-infections with other pathogens, and should not be used as the sole basis for treatment or other patient management decisions. Negative results must be combined with clinical observations, patient history, and epidemiological information. The expected result is Negative. Fact Sheet for Patients: SugarRoll.be Fact Sheet for Healthcare Providers: https://www.woods-mathews.com/ This test is not yet approved or cleared by the [place] FDA and  has been authorized for detection and/or diagnosis of SARS-CoV-2 by FDA under an Emergency Use Authorization (EUA). This EUA will remain  in effect (meaning this test can be used) for the duration of the COVID-19 declaration under Section 56 4(b)(1) of the Act, 21 U.S.C. section 360bbb-3(b)(1), unless the authorization is terminated or revoked sooner. Performed at [facility], [place] [address], [place], [place] [address]     Coagulation Studies: No results for input(s): LABPROT, INR in the last 72 hours.  Urinalysis: No results for input(s): COLORURINE, LABSPEC, PHURINE, GLUCOSEU, HGBUR, BILIRUBINUR, KETONESUR, PROTEINUR, UROBILINOGEN, NITRITE,  LEUKOCYTESUR in the last 168 hours.  Invalid input(s): APPERANCEUR  Lipid Panel:    Component Value Date/Time   CHOL 169 12/07/2019 0429   CHOL 183 09/29/2019 0000   TRIG 177 (H) 12/07/2019 0429   TRIG 154 09/29/2019 0000   HDL 30 (L) 12/07/2019 0429   CHOLHDL 5.6 12/07/2019 0429   VLDL 35 12/07/2019 0429   LDLCALC 104 (H) 12/07/2019 0429   LDLCALC 108 09/29/2019 0000    HgbA1C:  Lab Results  Component Value Date   HGBA1C 9.6 (H) 12/07/2019    Urine Drug Screen:  No results found for: LABOPIA, COCAINSCRNUR, LABBENZ, AMPHETMU, THCU, LABBARB  Alcohol Level: No results for input(s): ETH in the last 168 hours.  Other results: EKG: sinus rhythm at 88 bpm.  Imaging: DG Chest 2 View  Result Date: 12/06/2019 CLINICAL DATA:  Worsening altered level of consciousness EXAM: CHEST - 2 VIEW COMPARISON:  05/02/2014 FINDINGS: The heart size and mediastinal contours are within normal limits. Both lungs are clear. The visualized skeletal structures are unremarkable. IMPRESSION: No active cardiopulmonary disease. Electronically Signed   By: Randa Ngo M.D.   On: 12/06/2019 18:39   CT HEAD WO CONTRAST  Addendum Date: 12/06/2019   ADDENDUM REPORT: 12/06/2019 16:40 ADDENDUM: These results were called by telephone at the time of interpretation on 12/06/2019 at 4:40 pm to provider Harrel Lemon , who verbally acknowledged these results. Electronically Signed   By: Constance Holster M.D.   On: 12/06/2019 16:40   Result Date: 12/06/2019 CLINICAL DATA:  Altered mental status. EXAM: CT HEAD WITHOUT CONTRAST TECHNIQUE: Contiguous axial images were obtained from the base of the  skull through the vertex without intravenous contrast. COMPARISON:  MRI dated October 06, 2017. FINDINGS: Brain: There is a hypoattenuating area in the left frontal lobe measuring approximately 2.6 cm there is no acute intracranial hemorrhage. Atrophy and chronic microvascular ischemic changes are noted. Vascular: No hyperdense  vessel or unexpected calcification. Skull: Normal. Negative for fracture or focal lesion. Sinuses/Orbits: No acute finding. Other: None. IMPRESSION: 1. Findings concerning for an acute or subacute left MCA territory infarct involving the left frontal lobe as detailed above. 2. No acute intracranial hemorrhage. 3. Atrophy and chronic microvascular ischemic changes are noted. Electronically Signed: By: Constance Holster M.D. On: 12/06/2019 16:31   MR BRAIN WO CONTRAST  Result Date: 12/06/2019 CLINICAL DATA:  Speech difficulty. EXAM: MRI HEAD WITHOUT CONTRAST TECHNIQUE: Multiplanar, multiecho pulse sequences of the brain and surrounding structures were obtained without intravenous contrast. COMPARISON:  Brain MRI 10/06/2017 FINDINGS: Brain: Cortical and subcortical area of abnormal diffusion restriction within the left frontal lobe. No other site of acute ischemia. No midline shift or other mass effect. No acute hemorrhage. Multifocal white matter hyperintensity, most commonly due to chronic ischemic microangiopathy. There is generalized atrophy without lobar predilection. No chronic microhemorrhage. Normal midline structures. Vascular: Normal flow voids. Skull and upper cervical spine: Remote right mastoidectomy. Sinuses/Orbits: Paranasal sinuses are clear. Bilateral ocular lens replacements. Other: None IMPRESSION: Acute infarct within the anterior left MCA territory. No hemorrhage or mass effect. Electronically Signed   By: Ulyses Jarred M.D.   On: 12/06/2019 22:00   ECHOCARDIOGRAM COMPLETE  Result Date: 12/07/2019    ECHOCARDIOGRAM REPORT   Patient Name:   QUINTEL DELFIERRO Date of Exam: 12/07/2019 Medical Rec #:  [id number]      Height:       68.0 in Accession #:    [id number]     Weight:       181.0 lb Date of Birth:  04-20-1946      BSA:          1.959 m Patient Age:    14 years       BP:           134/66 mmHg Patient Gender: M              HR:           93 bpm. Exam Location:  [facility] Procedure: 2D Echo, Color  Doppler and Cardiac Doppler Indications:     I163.9 Stroke  History:         Patient has prior history of Echocardiogram examinations.                  Signs/Symptoms:Altered mental status; Risk                  Factors:Hypertension, HLD and Diabetes.  Sonographer:     Charmayne Sheer RDCS (AE) Referring Phys:  [id number] Arvella Merles MANSY Diagnosing Phys: Harrell Gave End MD  Sonographer Comments: Technically difficult study due to poor echo windows and no subcostal window. Image acquisition challenging due to patient body habitus. IMPRESSIONS  1. Left ventricular ejection fraction, by estimation, is 65 to 70%. The left ventricle has normal function. The left ventricle has no regional wall motion abnormalities. Left ventricular diastolic parameters are consistent with Grade I diastolic dysfunction (impaired relaxation).  2. Right ventricular systolic function is normal. The right ventricular size is not well visualized. Tricuspid regurgitation signal is inadequate for assessing PA pressure.  3. The mitral valve is grossly normal. No evidence of  mitral valve regurgitation. No evidence of mitral stenosis.  4. The aortic valve was not well visualized. Aortic valve regurgitation is not visualized. No aortic stenosis is present. FINDINGS  Left Ventricle: Left ventricular ejection fraction, by estimation, is 65 to 70%. The left ventricle has normal function. The left ventricle has no regional wall motion abnormalities. The left ventricular internal cavity size was normal in size. There is  no left ventricular hypertrophy. Left ventricular diastolic parameters are consistent with Grade I diastolic dysfunction (impaired relaxation). Right Ventricle: The right ventricular size is not well visualized. No increase in right ventricular wall thickness. Right ventricular systolic function is normal. Tricuspid regurgitation signal is inadequate for assessing PA pressure. Left Atrium: Left atrial size was normal in size. Right Atrium: Right  atrial size was not well visualized. Pericardium: The pericardium was not well visualized. Mitral Valve: The mitral valve is grossly normal. No evidence of mitral valve regurgitation. No evidence of mitral valve stenosis. MV peak gradient, 3.3 mmHg. The mean mitral valve gradient is 2.0 mmHg. Tricuspid Valve: The tricuspid valve is grossly normal. Tricuspid valve regurgitation is not demonstrated. Aortic Valve: The aortic valve was not well visualized. Aortic valve regurgitation is not visualized. No aortic stenosis is present. Aortic valve mean gradient measures 2.0 mmHg. Aortic valve peak gradient measures 4.4 mmHg. Aortic valve area, by VTI measures 2.00 cm. Pulmonic Valve: The pulmonic valve was not well visualized. Pulmonic valve regurgitation is not visualized. No evidence of pulmonic stenosis. Aorta: The aortic root is normal in size and structure. Pulmonary Artery: The pulmonary artery is of normal size. Venous: The inferior vena cava was not well visualized. IAS/Shunts: The interatrial septum was not well visualized.  LEFT VENTRICLE PLAX 2D LVIDd:         4.16 cm  Diastology LVIDs:         2.46 cm  LV e' lateral:   4.79 cm/s LV PW:         0.88 cm  LV E/e' lateral: 13.7 LV IVS:        0.67 cm  LV e' medial:    6.31 cm/s LVOT diam:     2.00 cm  LV E/e' medial:  10.4 LV SV:         34 LV SV Index:   17 LVOT Area:     3.14 cm  LEFT ATRIUM             Index LA diam:        1.90 cm 0.97 cm/m LA Vol (A2C):   30.9 ml 15.77 ml/m LA Vol (A4C):   29.0 ml 14.80 ml/m LA Biplane Vol: 29.3 ml 14.96 ml/m  AORTIC VALVE                   PULMONIC VALVE AV Area (Vmax):    2.15 cm    PV Vmax:       0.95 m/s AV Area (Vmean):   2.11 cm    PV Vmean:      67.000 cm/s AV Area (VTI):     2.00 cm    PV VTI:        0.164 m AV Vmax:           105.00 cm/s PV Peak grad:  3.6 mmHg AV Vmean:          67.600 cm/s PV Mean grad:  2.0 mmHg AV VTI:            0.171 m AV Peak Grad:  4.4 mmHg AV Mean Grad:      2.0 mmHg LVOT Vmax:          71.70 cm/s LVOT Vmean:        45.400 cm/s LVOT VTI:          0.109 m LVOT/AV VTI ratio: 0.64  AORTA Ao Root diam: 3.40 cm MITRAL VALVE MV Area (PHT): 5.66 cm    SHUNTS MV Peak grad:  3.3 mmHg    Systemic VTI:  0.11 m MV Mean grad:  2.0 mmHg    Systemic Diam: 2.00 cm MV Vmax:       0.91 m/s MV Vmean:      62.0 cm/s MV Decel Time: 134 msec MV E velocity: 65.50 cm/s MV A velocity: 96.50 cm/s MV E/A ratio:  0.68 Christopher End MD Electronically signed by Nelva Bush MD Signature Date/Time: 12/07/2019/10:38:42 AM    Final     Assessment: 74 y.o. male male with a known history of type diabetes mellitus, hypertension, dyslipidemia and hearing impairment, who presented to the emergency room with aphasia.  MRI of the brain personally reviewed and reveals an acute branch left MCA territory infarct.  Concern is for embolic etiology.  Patient on no antiplatelet therapy prior to admission.  Carotid dopplers pending.  Echocardiogram shows no cardiac source of emboli with an EF of 65-70%.  A1c 9.6, LDL 104.  BP controlled.    Stroke Risk Factors - diabetes mellitus, hyperlipidemia and hypertension  Plan: 1. PT consult, OT consult, Speech consult 2. Carotid dopplers pending 3. Prophylactic therapy-Dual antiplatelet therapy with ASA 81mg  and Plavix 75mg  for three weeks with change to ASA 81mg  daily alone as monotherapy after that time. 4. Telemetry monitoring.  If unremarkable during hospitalization would schedule patient for outpatient prolonged cardiac monitoring 5. Frequent neuro checks 6. Statin for lipid management with target LDL<70. 7. Blood sugar management with target A1c<7.0  Alexis Goodell, MD Neurology [phone] 12/07/2019, 10:58 AM

## 2019-12-07 NOTE — Progress Notes (Signed)
*  PRELIMINARY RESULTS* Echocardiogram 2D Echocardiogram has been performed.  Walter Harris 12/07/2019, 9:47 AM

## 2019-12-07 NOTE — Evaluation (Signed)
Occupational Therapy Evaluation Patient Details Name: Walter Harris MRN: [id number] DOB: 1946-01-20 Today's Date: 12/07/2019    History of Present Illness 74 y.o. male with a known history of type diabetes mellitus, hypertension, dyslipidemia and hearing impairment, who presented to the emergency room with acute onset of abnormal speech.   Clinical Impression   Walter Harris was seen for OT evaluation this date. Prior to hospital admission, pt was independent in all aspects of ADL/IADL. He endorses being active, driving, and doing grocery shopping. Pt lives with his spouse, but is unable to provide additional information on home set-up information. Pt intermittently tearful during session, particularly when communication deficits precluded his ability to answer evaluation questions in detail. This Walter Harris utilized therapeutic use of self to provide emotional support and encouragement. Pt presents with decreased BUE strength, FMC, and functional use with RUE (dominant) more significantly impacted than LUE this date. Pt endorses hx benign tremor in BUE at baseline. He requires supervision to occasional min assist during standing grooming tasks at sink. Min assist provided 2/2 brief LOB with pt noted to have R lateral lean while standing. With min A and prompting pt able to maintain balance and return to task safely. Currently pt demonstrates impairments as described below (See OT problem list) which functionally limit *his/her ability to perform ADL/self-care tasks. Pt would benefit from skilled OT to address noted impairments and functional limitations in order to maximize safety and independence while minimizing falls risk and caregiver burden. Upon hospital discharge, recommend outpatient OT upon acute care DC.       Follow Up Recommendations  Outpatient OT;Supervision - Intermittent    Equipment Recommendations  None recommended by OT    Recommendations for Other Services       Precautions /  Restrictions Precautions Precautions: Fall Restrictions Weight Bearing Restrictions: No      Mobility Bed Mobility Overal bed mobility: Needs Assistance Bed Mobility: Supine to Sit     Supine to sit: Min assist     General bed mobility comments: Pt requires min A to come to sitting at EOB. Mild LOB appreciated but pt able to recover balance with cueing/assist.  Transfers Overall transfer level: Modified independent               General transfer comment: Pt performs STS independently w/o AE this date. Mildly impulsive with mobility attempts. Requires cueing for safety.    Balance Overall balance assessment: Needs assistance Sitting-balance support: Feet supported;No upper extremity supported Sitting balance-Walter Harris Scale: Fair Sitting balance - Comments: Pt noted to have posterior LOB with initial sitting attempt, but able to self-correct with prompting. Postural control: Posterior lean Standing balance support: During functional activity;No upper extremity supported Standing balance-Walter Harris Scale: Fair Standing balance comment: R lateral LOB noted on one instance during functional activity. Pt requires min assist to regain balance while standing at sink for oral care.                           ADL either performed or assessed with clinical judgement   ADL Overall ADL's : Needs assistance/impaired                                       General ADL Comments: Pt performs oral care, fxl mobility, and LB dressing (adjust socks) with supervision to close CGA for safety this date. Pt is noted  to have increased difficulty with FM tasks including removing toothbrush from plastic. He reports he has had difficulty with opening tray items during meals and requires set-up assist for self-feeding.     Vision Baseline Vision/History: No visual deficits Patient Visual Report: No change from baseline Vision Assessment?: Yes Eye Alignment: Within Functional  Limits Ocular Range of Motion: Other (comment)(Pt has difficulty follwoing directions for visual assessment. Unable to test functional ocular ROM this date. Pt turns head to follow visual stimulus.) Tracking/Visual Pursuits: Unable to hold eye position out of midline;Impaired - to be further tested in functional context;Requires cues, head turns, or add eye shifts to track Visual Fields: No apparent deficits Additional Comments: Pt has difficulty follwoing directions for visual assessment. Unable to test functional ocular ROM this date. Pt turns head to follow visual stimulus. Pt does not appear to have visual deficits functionally. Suspect cognitive communication deficit limiting ability to follow VCs for visual assessment.     Perception     Praxis      Pertinent Vitals/Pain Pain Assessment: 0-10 Pain Score: 7  Pain Location: Pt endorses generalized pain as well as back pain this date. Pain Descriptors / Indicators: Aching;Constant;Grimacing Pain Intervention(s): Limited activity within patient's tolerance;Monitored during session;Repositioned;Premedicated before session     Hand Dominance Right   Extremity/Trunk Assessment Upper Extremity Assessment Upper Extremity Assessment: RUE deficits/detail;LUE deficits/detail(Pt endorses baseline hx of essential tremor.) RUE Deficits / Details: Grossly 3+/5 t/o. Grip strength significantly decreased as compared to LUE. Decreased Farrell. Pt has difficulty following VCs for MMT, assessment limited. RUE Coordination: decreased fine motor LUE Deficits / Details: Grosly 4/5 t/o.   Lower Extremity Assessment Lower Extremity Assessment: Generalized weakness       Communication Communication Communication: Expressive difficulties   Cognition Arousal/Alertness: Awake/alert Behavior During Therapy: Restless;Impulsive Overall Cognitive Status: Impaired/Different from baseline Area of Impairment: Safety/judgement;Awareness;Following commands                        Following Commands: Follows multi-step commands with increased time;Follows one step commands consistently Safety/Judgement: Decreased awareness of safety Awareness: Emergent       General Comments  Pt implulsive and occasionally tearful during session. Appears to become most upset over expressive difficulties. Does well wtih yes/no questions.    Exercises Other Exercises Other Exercises: Pt educated on falls prevention strategies for home and hospital, compensatory strategies for functional communication, and routines modifications to support safety and fxl independence upon hospital DC. Other Exercises: OT engages pt in ADL tasks as described above. See ADL section for additional details.   Shoulder Instructions      Home Living Family/patient expects to be discharged to:: Private residence Living Arrangements: Spouse/significant other Available Help at Discharge: Family;Available 24 hours/day   Home Access: (Pt unable to provide home set-up info.)                     Home Equipment: Cane - single point;Walker - 2 wheels          Prior Functioning/Environment Level of Independence: Independent        Comments: Per chart, pt drives, runs errands, able to be relatively active        OT Problem List: Decreased strength;Decreased coordination;Decreased cognition;Decreased activity tolerance;Decreased safety awareness;Impaired balance (sitting and/or standing);Decreased knowledge of use of DME or AE;Impaired UE functional use;Impaired vision/perception      OT Treatment/Interventions: Self-care/ADL training;Therapeutic exercise;Therapeutic activities;DME and/or AE instruction;Patient/family education;Balance training    OT  Goals(Current goals can be found in the care plan section) Acute Rehab OT Goals Patient Stated Goal: To improve my communication and get back to my regular routines. OT Goal Formulation: With patient Time For Goal  Achievement: 12/21/19 Potential to Achieve Goals: Good ADL Goals Pt Will Perform Upper Body Dressing: sitting;with modified independence;with caregiver independent in assisting Pt Will Perform Lower Body Dressing: sit to/from stand;with supervision;with caregiver independent in assisting Pt Will Transfer to Toilet: regular height toilet;ambulating;with modified independence(With LRAD PRN for safety) Pt Will Perform Toileting - Clothing Manipulation and hygiene: sit to/from stand;with modified independence(With LRAD PRN for safety)  OT Frequency: Min 2X/week   Barriers to D/C:            Co-evaluation              AM-PAC OT "6 Clicks" Daily Activity     Outcome Measure Help from another person eating meals?: A Little Help from another person taking care of personal grooming?: A Little Help from another person toileting, which includes using toliet, bedpan, or urinal?: A Little Help from another person bathing (including washing, rinsing, drying)?: A Little Help from another person to put on and taking off regular upper body clothing?: A Little Help from another person to put on and taking off regular lower body clothing?: A Little 6 Click Score: 18   End of Session Equipment Utilized During Treatment: Gait belt  Activity Tolerance: Patient tolerated treatment well Patient left: with nursing/sitter in room;Other (comment)(Pt left on room commode with nsg sitter present to assist.)  OT Visit Diagnosis: Muscle weakness (generalized) (M62.81);History of falling (Z91.81)                Time: [id number] OT Time Calculation (min): 33 min Charges:  OT General Charges $OT Visit: 1 Visit OT Evaluation $OT Eval Moderate Complexity: 1 Mod OT Treatments $Self Care/Home Management : 23-37 mins  Shara Blazing, M.S., OTR/L Ascom: [phone] 12/07/19, 3:14 PM

## 2019-12-07 NOTE — Evaluation (Signed)
Clinical/Bedside Swallow Evaluation Patient Details  Name: Walter Harris MRN: [id number] Date of Birth: Apr 28, 1946  Today's Date: 12/07/2019 Time: SLP Start Time (ACUTE ONLY): 1150 SLP Stop Time (ACUTE ONLY): 1230 SLP Time Calculation (min) (ACUTE ONLY): 40 min  Past Medical History:  Past Medical History:  Diagnosis Date  . Basal cell carcinoma    multiple  . Cataracts, both eyes 2019   surgery pending April 2020  . Complication of anesthesia    post-operative cognitive dysfunction after ACDF on 05/01/11 [facility])  . Deaf    right ear  . Diabetes mellitus   . DJD (degenerative joint disease)    lumbar spine  . Gait abnormality 11/12/2019  . Hearing impaired    hearing aids-transmitter rt   . History of blood transfusion    as a baby in 29  . History of colonic polyps   . Hyperlipidemia   . Hypertension    pt. denies high blood pressure  . Kidney stone 1967  . Memory change 12/17/2017  . Nose fracture    3x  . OCD (obsessive compulsive disorder)   . Stroke [facility])    "mini stroke, that's what gave me the tremors"  . Stuttering   . Tremor, essential   . Vertigo    random  . Wears dentures    full upper  . Wears glasses    Past Surgical History:  Past Surgical History:  Procedure Laterality Date  . APPENDECTOMY  1960  . BACK SURGERY  2012   lumbar surgery  . CATARACT EXTRACTION W/PHACO Right 04/12/2019   Procedure: CATARACT EXTRACTION PHACO AND INTRAOCULAR LENS PLACEMENT (IOC) RIGHT, DIABETIC;  Surgeon: [name], MD;  Location: [place];  Service: Ophthalmology;  Laterality: Right;  Diabetic - insulin and oral meds  . CATARACT EXTRACTION W/PHACO Left 05/10/2019   Procedure: CATARACT EXTRACTION PHACO AND INTRAOCULAR LENS PLACEMENT (IOC)  LEFT DIABETIC  00:25.6  14.4%  3.75;  Surgeon: [name], MD;  Location: [place];  Service: Ophthalmology;  Laterality: Left;  Diabetic - insulin and oral meds  . CERVICAL FUSION  1990  . CERVICAL  FUSION  8/12   multiple levels with plates  . CHOLESTEATOMA EXCISION  1993  . COLLATERAL LIGAMENT REPAIR, ELBOW     bilateral, nerve improvement left 08/04, right 09/04  . COLONOSCOPY    . CYST EXCISION     back of cervical spine total of 5 surgeries  . EXTERNAL EAR SURGERY     multiple ear surguries as a child  . OTHER SURGICAL HISTORY     benign head tumor#5  . OTHER SURGICAL HISTORY     sebaceous cysts-post neck x5  . SHOULDER ARTHROSCOPY W/ ACROMIAL REPAIR  08/2004   left shoulder  . TONSILLECTOMY    . ULNAR NERVE TRANSPOSITION Right 05/13/2013   Procedure: RIGHT ULNAR NERVE DECOMPRESSION/TRANSPOSITION;  Surgeon: [name]., MD;  Location: [place];  Service: Orthopedics;  Laterality: Right;   HPI:  Pt is a 74 y.o. male with a known history of type diabetes mellitus, hypertension, dyslipidemia and hearing impairment, who presented to the emergency room with acute onset of abnormal speech.  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 No hemorrhage or mass effect". CXR clear.  Prior to admit, pt was living at home I w/ all ADLs.    Assessment / Plan / Recommendation Clinical Impression  Pt presents w/ adequate oropharyngeal phase swallowing function w/ no neuromuscular  oropharyngeal phase deficits noted w/ po trials. Pt appears at reduced risk for aspiration when following general aspiration precautions. Prior to hospital admission, pt was independent in all aspects of ADLs. He endorsed eating a regular diet. Pt intermittently Labile during session, particularly when attempting to answer questions and given details re: circumstances re: his family. Noted expressive language deficits, dysfluency. Pt consumed po trials of thin liquids, purees, and soft solids w/ no overt clinical s/s of aspiration noted; no wet vocal quality or decline in respiratory status during/post po trials. Oral phase was [facility] for bolus management and oral clearing;  timely A-P transfer of trials. OM exam appeared [facility] w/ no unilateral lingual/labial weakness noted. Pt fed self w/ min setup support d/t trials in bed. Recommend continue current Regular diet w/ thin liquids w/ general aspiration precautions. Pt has been tolerating Pills in liquids w/ NSG per report. No further skilled ST services indicated for swallowing. ST services will f/u w/ Language evaluation for expressive Aphasia.  SLP Visit Diagnosis: Dysphagia, unspecified (R13.10)    Aspiration Risk  No limitations    Diet Recommendation  Regular diet w/ thin liquids; general aspiration precautions  Medication Administration: Whole meds with liquid(Whole in puree if needed for easier swallowing)    Other  Recommendations Recommended Consults: (n/a) Oral Care Recommendations: Oral care BID;Patient independent with oral care Other Recommendations: (n/a)   Follow up Recommendations None      Frequency and Duration (n/a)  (n/a)       Prognosis Prognosis for Safe Diet Advancement: Good      Swallow Study   General Date of Onset: 12/06/19 HPI: Pt is a 74 y.o. male with a known history of type diabetes mellitus, hypertension, dyslipidemia and hearing impairment, who presented to the emergency room with acute onset of abnormal speech.  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 No hemorrhage or mass effect". CXR clear.  Prior to admit, pt was living at home I w/ all ADLs.  Type of Study: Bedside Swallow Evaluation Previous Swallow Assessment: none  Diet Prior to this Study: Regular;Thin liquids Temperature Spikes Noted: No(wbc 6.0) Respiratory Status: Room air History of Recent Intubation: No Behavior/Cognition: Alert;Cooperative;Pleasant mood(Expressive language deficits ) Oral Cavity Assessment: Within Functional Limits Oral Care Completed by SLP: Recent completion by staff Oral Cavity - Dentition: Adequate natural dentition Vision: Functional for  self-feeding Self-Feeding Abilities: Able to feed self Patient Positioning: Upright in bed Baseline Vocal Quality: Normal Volitional Cough: Strong Volitional Swallow: Able to elicit    Oral/Motor/Sensory Function Overall Oral Motor/Sensory Function: Within functional limits   Ice Chips Ice chips: Not tested   Thin Liquid Thin Liquid: Within functional limits Presentation: Cup;Self Fed;Straw(4 trials via each)    Nectar Thick Nectar Thick Liquid: Not tested   Honey Thick Honey Thick Liquid: Not tested   Puree Puree: Within functional limits Presentation: Spoon;Self Fed(3 trials)   Solid     Solid: Within functional limits Presentation: Self Fed;Spoon(5 trials)       Orinda Kenner, MS, CCC-SLP Cyle Kenyon 12/07/2019,4:10 PM

## 2019-12-07 NOTE — Evaluation (Signed)
Physical Therapy Evaluation Patient Details Name: Walter Harris MRN: [id number] DOB: Feb 02, 1946 Today's Date: 12/07/2019   History of Present Illness  74 y.o. male with a known history of type diabetes mellitus, hypertension, dyslipidemia and hearing impairment, who presented to the emergency room with acute onset of abnormal speech.  Clinical Impression  Pt with frustration and near agitation t/o the session (seeming to step from communication, expressive aphasia issues) but was able to participate with all requested tasks, needing constant cuing and reorientation t/o session.  He had some minimal R UE coordination and strength issues but generally was functional.  He had, to a lesser degree, some minimal R LE weakness (less so proximally) but again was functional with full ROM.  Pt able to stand w/o AD and though he showed some impulsivity and unsteadiness he was ultimately able to ambulate around the nurses' station w/o AD. Wife agrees to plan for outpt PT stating that she can take him to outpatient and give consistent safety supervision at home.  Pt's cognition and safety awareness are his biggest issues at this time, though he is far from his baseline and needing much more cuing/supervision than normal.    Follow Up Recommendations Outpatient PT(supervision for safety)    Equipment Recommendations  None recommended by PT    Recommendations for Other Services       Precautions / Restrictions Precautions Precautions: Fall Restrictions Weight Bearing Restrictions: No      Mobility  Bed Mobility Overal bed mobility: Independent             General bed mobility comments: Pt able to transition to EOB w/o hesitation, cues to insure safety as he showed some impulsivity to watch IV and not transition to standing too quickly  Transfers Overall transfer level: Modified independent Equipment used: None             General transfer comment: Pt able to rise w/o assist, initially  reaching to counter top, with cuing was able to rise and maintain static standing w/o assist  Ambulation/Gait Ambulation/Gait assistance: Min guard Gait Distance (Feet): 200 Feet Assistive device: None       General Gait Details: Pt initially with some stagger stepping, that improved but continued to be an issue t/o the effort.  Pt did not have any overt LOBs but did use hallway rails intermittently and could have benefited from [place] Mobility    Modified Rankin (Stroke Patients Only)       Balance Overall balance assessment: Mild deficits observed, not formally tested                                           Pertinent Vitals/Pain Pain Assessment: No/denies pain    Home Living Family/patient expects to be discharged to:: Private residence Living Arrangements: Spouse/significant other Available Help at Discharge: Family;Available 24 hours/day   Home Access: (pt unable to answer, wife left before getting info)       Home Equipment: Cane - single point;Walker - 2 wheels      Prior Function Level of Independence: Independent         Comments: Pt drives, runs errands, able to be relatively activ     Hand Dominance        Extremity/Trunk Assessment   Upper Extremity Assessment Upper Extremity  Assessment: Overall WFL for tasks assessed;Generalized weakness(R grossly 3+ to 4-/5, L 4/5)    Lower Extremity Assessment Lower Extremity Assessment: Generalized weakness;Overall WFL for tasks assessed(L WNL, R grossly 4/5)       Communication   Communication: Expressive difficulties  Cognition Arousal/Alertness: Awake/alert Behavior During Therapy: Restless;Impulsive Overall Cognitive Status: Impaired/Different from baseline Area of Impairment: Safety/judgement;Awareness                               General Comments: Pt struggles to answer most questions and quickly becomes frustrated w/  inability to find words/complete sentences      General Comments General comments (skin integrity, edema, etc.): Pt impulsive, at times seeming to become agitated but no overt lashing out.  Clearly frustrated by inability to make needs/thoughts known    Exercises     Assessment/Plan    PT Assessment Patient needs continued PT services  PT Problem List Decreased strength;Decreased range of motion;Decreased activity tolerance;Decreased balance;Decreased mobility;Decreased coordination;Decreased cognition;Decreased knowledge of precautions;Decreased knowledge of use of DME;Decreased safety awareness       PT Treatment Interventions DME instruction;Gait training;Stair training;Functional mobility training;Therapeutic activities;Therapeutic exercise;Balance training;Neuromuscular re-education;Cognitive remediation;Patient/family education    PT Goals (Current goals can be found in the Care Plan section)  Acute Rehab PT Goals Patient Stated Goal: Unable to state PT Goal Formulation: With patient Time For Goal Achievement: 12/21/19 Potential to Achieve Goals: Fair    Frequency 7X/week   Barriers to discharge        Co-evaluation               AM-PAC PT "6 Clicks" Mobility  Outcome Measure Help needed turning from your back to your side while in a flat bed without using bedrails?: None Help needed moving from lying on your back to sitting on the side of a flat bed without using bedrails?: None Help needed moving to and from a bed to a chair (including a wheelchair)?: None Help needed standing up from a chair using your arms (e.g., wheelchair or bedside chair)?: None Help needed to walk in hospital room?: A Little Help needed climbing 3-5 steps with a railing? : A Little 6 Click Score: 22    End of Session Equipment Utilized During Treatment: Gait belt Activity Tolerance: Patient tolerated treatment well Patient left: in chair;with call bell/phone within reach;with  nursing/sitter in room Nurse Communication: Mobility status PT Visit Diagnosis: Muscle weakness (generalized) (M62.81);Unsteadiness on feet (R26.81);Hemiplegia and hemiparesis;Other symptoms and signs involving the nervous system (R29.898) Hemiplegia - Right/Left: Right Hemiplegia - dominant/non-dominant: Dominant Hemiplegia - caused by: Cerebral infarction    Time: 1010-1047 PT Time Calculation (min) (ACUTE ONLY): 37 min   Charges:   PT Evaluation $PT Eval Low Complexity: 1 Low PT Treatments $Gait Training: 8-22 mins        Kreg Shropshire, DPT 12/07/2019, 12:40 PM

## 2019-12-07 NOTE — Progress Notes (Signed)
PROGRESS NOTE    Walter Harris  [id number] DOB: Oct 23, 1945 DOA: 12/06/2019  PCP: [name], MD    LOS - 0   Brief Narrative:  Walter Harris  is a 74 y.o. Caucasian male with a known history of type diabetes mellitus, hypertension, dyslipidemia and hearing impairment, who presented to the ED on 3/29 with abnormal speech with expressive dysphasia and stuttering that started on 3/27.  He was found to have an acute left MCA stroke.  In the ED, hypertensive, 167/116, other vitals were normal.  BMP and CBC were unremarkable.  Troponin was normal.  EKG showed normal sinus rhythm and rate of 88 with left axis deviation, T wave inversion in V1 and poor R wave progression.  Noncontrast head CT showed an acute versus subacute left MCA territory infarct involving the left frontal lobe, in addition to chronic atrophy and microvascular ischemic changes.  Subsequent MRI showed samein addition to.  Patient was treated in the ED with aspirin and benzodiazepines.  Admitted to hospitalist service with neurology consulted.  Subjective 3/30: Patient seen at bedside this morning.  Wife present during encounter.  Patient was reportedly agitated and swinging at staff overnight and required a sitter.  Sitter still present this morning.  Patient does not seem agitated at this time.  He answers some questions reliably other questions he answers with repeating the word no.    Asked patient's wife about the behavioral issues that were witnessed last night and if she sees these at home.  She states yes, but not as severe.  She does however express concern over these behavioral issues and her safety at home with him.  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  No prior known history of A. fib.  Echo showed EF of 65 to 70%.  A1c is 9.6%.  LDL 104.  Patient presented well outside window for  tPA. --Follow-up echo and carotid Dopplers --Dual antiplatelet therapy with aspirin and Plavix for 3 weeks, then aspirin 81 daily --Continue Zetia --PT, OT and SLP evaluations --Telemetry monitoring, with ambulatory cardiac monitor on discharge if no events revealed on telemetry during admission --Neuro checks --Hold Mobic --Glycemic control, goal A1c less than 7.0% --Neurology following  Type 2 diabetes -A1c 9.6%.  Uncontrolled. --Hold Metformin --Continue Glucotrol XL and Invokana --Continue basal coverage --Sliding scale NovoLog  Essential hypertension   --Continued on home regimen  Hyperlipidemia -patient has statin allergy. --Continue home Zetia   DVT prophylaxis: Lovenox   Code Status: DNR  Family Communication: Wife at bedside during encounter, updated and all questions answered  Disposition Plan: To be determined.  Patient lives at home with wife, however wife has concerns of her safety due to patient's behavioral issues that have developed secondary to his stroke.  Therapy evaluations are pending at this time.  Expect he will need SNF placement.  Anticipate medically stable for discharge in 24 to 48 hours pending neurology clearance. Coming From home Exp DC Date 3/31-4/1 Barriers as above Medically Stable for Discharge?  No  Consultants:   Neurology  Procedures:   None  Antimicrobials:   None   Objective: Vitals:   12/06/19 2041 12/06/19 2045 12/06/19 2206 12/07/19 0435  BP:   (!) 146/100 130/72  Pulse: 89 89 76 88  Resp: 19 (!) 21  18  Temp:   97.7 F (36.5 C) 98.4 F (36.9 C)  TempSrc:   Oral   SpO2: 95% 95% 100% 97%  Weight:   81.3 kg 82.1 kg  Height:   5\' 8"  (1.727 m)     Intake/Output Summary (Last 24 hours) at 12/07/2019 0802 Last data filed at 12/07/2019 0600 Gross per 24 hour  Intake 571.02 ml  Output 1175 ml  Net -603.98 ml   Filed Weights   12/06/19 1710 12/06/19 2206 12/07/19 0435  Weight: 83 kg 81.3 kg 82.1 kg     Examination:  General exam: awake, alert, no acute distress HEENT: atraumatic, clear conjunctiva, anicteric sclera, moist mucus membranes, hearing grossly normal  Respiratory system: CTAB, no wheezes, rales or rhonchi, normal respiratory effort. Cardiovascular system: normal S1/S2, RRR, no pedal edema.   Gastrointestinal system: soft, NT, ND Central nervous system: Alert and oriented, no focal motor deficits, intermittent fluent speech mixed with expressive aphasia (mostly the word no), follows basic commands, appears to have no comprehension deficits Extremities: moves all, no edema, normal tone Skin: dry, intact, normal temperature, normal color Psychiatry: normal mood, congruent affect, unable to assess judgment and insight secondary to expressive aphasia    Data Reviewed: I have personally reviewed following labs and imaging studies  CBC: Recent Labs  Lab 12/06/19 1747 12/07/19 0429  WBC 7.9 6.0  HGB 15.7 14.8  HCT 46.7 43.5  MCV 88.4 87.9  PLT 213 0000000   Basic Metabolic Panel: Recent Labs  Lab 12/06/19 1747 12/07/19 0429  NA 137 141  K 4.3 3.7  CL 101 108  CO2 23 23  GLUCOSE 135* 85  BUN 22 18  CREATININE 1.19 0.99  CALCIUM 9.5 8.9   GFR: Estimated Creatinine Clearance: 69.5 mL/min (by C-G formula based on SCr of 0.99 mg/dL). Liver Function Tests: Recent Labs  Lab 12/07/19 0429  AST 19  ALT 20  ALKPHOS 58  BILITOT 0.9  PROT 6.5  ALBUMIN 3.6   No results for input(s): LIPASE, AMYLASE in the last 168 hours. No results for input(s): AMMONIA in the last 168 hours. Coagulation Profile: No results for input(s): INR, PROTIME in the last 168 hours. Cardiac Enzymes: No results for input(s): CKTOTAL, CKMB, CKMBINDEX, TROPONINI in the last 168 hours. BNP (last 3 results) No results for input(s): PROBNP in the last 8760 hours. HbA1C: Recent Labs    12/06/19 1747  HGBA1C 9.6*   CBG: Recent Labs  Lab 12/06/19 1712 12/06/19 2210  GLUCAP 161* 93    Lipid Profile: Recent Labs    12/07/19 0429  CHOL 169  HDL 30*  LDLCALC 104*  TRIG 177*  CHOLHDL 5.6   Thyroid Function Tests: No results for input(s): TSH, T4TOTAL, FREET4, T3FREE, THYROIDAB in the last 72 hours. Anemia Panel: No results for input(s): VITAMINB12, FOLATE, FERRITIN, TIBC, IRON, RETICCTPCT in the last 72 hours. Sepsis Labs: No results for input(s): PROCALCITON, LATICACIDVEN in the last 168 hours.  Recent Results (from the past 240 hour(s))  SARS CORONAVIRUS 2 (TAT 6-24 HRS) Nasopharyngeal Nasopharyngeal Swab     Status: None   Collection Time: 12/06/19  5:47 PM   Specimen: Nasopharyngeal Swab  Result Value Ref Range Status   SARS Coronavirus 2 NEGATIVE NEGATIVE Final    Comment: (NOTE) SARS-CoV-2 target nucleic acids are NOT DETECTED. The SARS-CoV-2 RNA is generally detectable in upper and lower respiratory specimens during the acute phase of infection. Negative results do not preclude SARS-CoV-2 infection, do not rule out co-infections with other pathogens, and should not be used as the sole basis for treatment or  other patient management decisions. Negative results must be combined with clinical observations, patient history, and epidemiological information. The expected result is Negative. Fact Sheet for Patients: SugarRoll.be Fact Sheet for Healthcare Providers: https://www.woods-mathews.com/ This test is not yet approved or cleared by the [place] FDA and  has been authorized for detection and/or diagnosis of SARS-CoV-2 by FDA under an Emergency Use Authorization (EUA). This EUA will remain  in effect (meaning this test can be used) for the duration of the COVID-19 declaration under Section 56 4(b)(1) of the Act, 21 U.S.C. section 360bbb-3(b)(1), unless the authorization is terminated or revoked sooner. Performed at [facility], [place] [address], [place], [place] [address]          Radiology  Studies: DG Chest 2 View  Result Date: 12/06/2019 CLINICAL DATA:  Worsening altered level of consciousness EXAM: CHEST - 2 VIEW COMPARISON:  05/02/2014 FINDINGS: The heart size and mediastinal contours are within normal limits. Both lungs are clear. The visualized skeletal structures are unremarkable. IMPRESSION: No active cardiopulmonary disease. Electronically Signed   By: Randa Ngo M.D.   On: 12/06/2019 18:39   CT HEAD WO CONTRAST  Addendum Date: 12/06/2019   ADDENDUM REPORT: 12/06/2019 16:40 ADDENDUM: These results were called by telephone at the time of interpretation on 12/06/2019 at 4:40 pm to provider Harrel Lemon , who verbally acknowledged these results. Electronically Signed   By: Constance Holster M.D.   On: 12/06/2019 16:40   Result Date: 12/06/2019 CLINICAL DATA:  Altered mental status. EXAM: CT HEAD WITHOUT CONTRAST TECHNIQUE: Contiguous axial images were obtained from the base of the skull through the vertex without intravenous contrast. COMPARISON:  MRI dated October 06, 2017. FINDINGS: Brain: There is a hypoattenuating area in the left frontal lobe measuring approximately 2.6 cm there is no acute intracranial hemorrhage. Atrophy and chronic microvascular ischemic changes are noted. Vascular: No hyperdense vessel or unexpected calcification. Skull: Normal. Negative for fracture or focal lesion. Sinuses/Orbits: No acute finding. Other: None. IMPRESSION: 1. Findings concerning for an acute or subacute left MCA territory infarct involving the left frontal lobe as detailed above. 2. No acute intracranial hemorrhage. 3. Atrophy and chronic microvascular ischemic changes are noted. Electronically Signed: By: Constance Holster M.D. On: 12/06/2019 16:31   MR BRAIN WO CONTRAST  Result Date: 12/06/2019 CLINICAL DATA:  Speech difficulty. EXAM: MRI HEAD WITHOUT CONTRAST TECHNIQUE: Multiplanar, multiecho pulse sequences of the brain and surrounding structures were obtained without intravenous  contrast. COMPARISON:  Brain MRI 10/06/2017 FINDINGS: Brain: Cortical and subcortical area of abnormal diffusion restriction within the left frontal lobe. No other site of acute ischemia. No midline shift or other mass effect. No acute hemorrhage. Multifocal white matter hyperintensity, most commonly due to chronic ischemic microangiopathy. There is generalized atrophy without lobar predilection. No chronic microhemorrhage. Normal midline structures. Vascular: Normal flow voids. Skull and upper cervical spine: Remote right mastoidectomy. Sinuses/Orbits: Paranasal sinuses are clear. Bilateral ocular lens replacements. Other: None IMPRESSION: Acute infarct within the anterior left MCA territory. No hemorrhage or mass effect. Electronically Signed   By: Ulyses Jarred M.D.   On: 12/06/2019 22:00        Scheduled Meds: .  stroke: mapping our early stages of recovery book   Does not apply Once  . aspirin EC  325 mg Oral Daily  . canagliflozin  300 mg Oral QAC breakfast  . enoxaparin (LOVENOX) injection  40 mg Subcutaneous Q24H  . insulin aspart  0-9 Units Subcutaneous TID PC & HS  .  propranolol ER  60 mg Oral Daily  . sodium chloride flush  3 mL Intravenous Once   Continuous Infusions: . sodium chloride 75 mL/hr at 12/06/19 2223     LOS: 0 days    Time spent: 35 minutes    Ezekiel Slocumb, DO [facility]   If 7PM-7AM, please contact night-coverage [email or web address] 12/07/2019, 8:02 AM

## 2019-12-08 ENCOUNTER — Encounter: Payer: Self-pay | Admitting: Internal Medicine

## 2019-12-08 LAB — GLUCOSE, CAPILLARY
Glucose-Capillary: 136 mg/dL — ABNORMAL HIGH (ref 70–99)
Glucose-Capillary: 147 mg/dL — ABNORMAL HIGH (ref 70–99)
Glucose-Capillary: 169 mg/dL — ABNORMAL HIGH (ref 70–99)
Glucose-Capillary: 108 mg/dL — ABNORMAL HIGH (ref 70–99)

## 2019-12-08 MED ORDER — IRBESARTAN 150 MG PO TABS
150.0000 mg | ORAL_TABLET | Freq: Every day | ORAL | Status: DC
  Administered 2019-12-08 – 2019-12-09 (×2): 150 mg via ORAL
  Filled 2019-12-08 (×2): qty 1

## 2019-12-08 MED ORDER — AMLODIPINE BESYLATE 5 MG PO TABS
2.5000 mg | ORAL_TABLET | Freq: Every day | ORAL | Status: DC
  Administered 2019-12-08: 2.5 mg via ORAL
  Filled 2019-12-08: qty 1

## 2019-12-08 NOTE — Progress Notes (Signed)
Physical Therapy Treatment Patient Details Name: Walter Harris MRN: [id number] DOB: November 09, 1945 Today's Date: 12/08/2019    History of Present Illness 74 y.o. male with a known history of type diabetes mellitus, hypertension, dyslipidemia and hearing impairment, who presented to the emergency room with acute onset of abnormal speech.    PT Comments    Pt was in recliner at beginning of session with spouse at bedside.  Pt became easily frustrated when attempting to answer questions and could not do so consistently.  Pt is impulsive when given tasks.  Pt amb. 378ft mod assist with staggering towards the right during gait.  Pt was able to correct with tactile and verbal cueing.  During amb, pt had 2 episodes of LOB due to increased cadence and narrow BOS.  Pt instructed to slow down and widen BOS and did not have any additional LOB episodes.  Pt and wife were instructed on pt safety when walking and to use RW until balance improves. Therapist issued gait belt for home use.  Pt left in recliner with call bell in reach. PT recommends D/C home with OP PT to follow to address balance and safety concerns. Acute PT to continue to follow per POC.  Follow Up Recommendations  Outpatient PT     Equipment Recommendations  None recommended by PT    Recommendations for Other Services       Precautions / Restrictions Precautions Precautions: Fall Restrictions Weight Bearing Restrictions: No    Mobility  Bed Mobility               General bed mobility comments: Pt was in recliner before session and was in recliner at end of session   Transfers Overall transfer level: Needs assistance Equipment used: None Transfers: Sit to/from Stand Sit to Stand: Min guard         General transfer comment: Pt stands independently and impulsively   Ambulation/Gait Ambulation/Gait assistance: Mod assist Gait Distance (Feet): 300 Feet   Gait Pattern/deviations: Narrow base of support;Staggering  right;Scissoring Gait velocity: due to pt impulsiveness PT restricts gait speed   General Gait Details: Pt staggers to the right and tactile cueing used to correct.  Narrow BOS and increase cadance caused a few episodes of LOB.   Stairs             Wheelchair Mobility    Modified Rankin (Stroke Patients Only)       Balance Overall balance assessment: Needs assistance Sitting-balance support: Feet supported;No upper extremity supported Sitting balance-Leahy Scale: Fair   Postural control: Posterior lean Standing balance support: During functional activity;No upper extremity supported Standing balance-Leahy Scale: Fair                              Cognition Arousal/Alertness: Awake/alert Behavior During Therapy: Restless;Impulsive Overall Cognitive Status: Within Functional Limits for tasks assessed Area of Impairment: Safety/judgement;Awareness;Following commands                       Following Commands: Follows multi-step commands with increased time;Follows one step commands consistently Safety/Judgement: Decreased awareness of safety Awareness: Emergent   General Comments: Pt struggles to answer most questions and quickly becomes frustrated w/ inability to find words/complete sentences.  Pt very impulsive but is able to follow commands.      Exercises Other Exercises Other Exercises: Pt educated on falls, safety, and strategies for home to prevent falls while at home  General Comments        Pertinent Vitals/Pain Pain Assessment: No/denies pain Pain Score: 0-No pain    Home Living                      Prior Function            PT Goals (current goals can now be found in the care plan section) Progress towards PT goals: Progressing toward goals    Frequency    7X/week      PT Plan Current plan remains appropriate    Co-evaluation              AM-PAC PT "6 Clicks" Mobility   Outcome Measure  Help  needed turning from your back to your side while in a flat bed without using bedrails?: None Help needed moving from lying on your back to sitting on the side of a flat bed without using bedrails?: None Help needed moving to and from a bed to a chair (including a wheelchair)?: None Help needed standing up from a chair using your arms (e.g., wheelchair or bedside chair)?: None Help needed to walk in hospital room?: A Little Help needed climbing 3-5 steps with a railing? : A Little 6 Click Score: 22    End of Session Equipment Utilized During Treatment: Gait belt Activity Tolerance: Patient tolerated treatment well Patient left: in chair;with call bell/phone within reach;with nursing/sitter in room Nurse Communication: Mobility status PT Visit Diagnosis: Muscle weakness (generalized) (M62.81);Unsteadiness on feet (R26.81);Hemiplegia and hemiparesis;Other symptoms and signs involving the nervous system (R29.898) Hemiplegia - Right/Left: Right Hemiplegia - dominant/non-dominant: Dominant Hemiplegia - caused by: Cerebral infarction     Time: [id number] PT Time Calculation (min) (ACUTE ONLY): 15 min  Charges:  $Gait Training: 8-22 mins                     Julaine Fusi PTA 12/08/19, 4:29 PM

## 2019-12-08 NOTE — Evaluation (Signed)
Speech Language Pathology Evaluation Patient Details Name: Walter Harris MRN: [id number] DOB: 10/27/1945 Today's Date: 12/08/2019 Time: [id number] SLP Time Calculation (min) (ACUTE ONLY): 60 min  Problem List:  Patient Active Problem List   Diagnosis Date Noted  . Stroke ([place]) 12/07/2019  . CVA (cerebral vascular accident) ([place]) 12/06/2019  . Gait abnormality 11/12/2019  . Tremor, essential 11/12/2019  . Health care maintenance 10/04/2018  . Thumb pain 10/04/2018  . Memory change 12/17/2017  . Paresthesia 12/03/2017  . Lightheaded 08/28/2017  . Encounter for chronic pain management 06/18/2017  . Neck pain 05/14/2017  . Panic 01/10/2017  . Dysuria 03/29/2016  . Muscle ache 10/05/2015  . Syncope 07/08/2015  . Chronic back pain 04/12/2015  . Medicare annual wellness visit, initial 03/08/2015  . Advance care planning 03/08/2015  . Dysgeusia 11/28/2014  . Anxiety state 10/27/2014  . Diabetic polyneuropathy ([place]) 02/14/2014  . Rhinitis, chronic 01/13/2012  . Basal cell carcinoma   . LUMBAR RADICULOPATHY 10/16/2010  . HLD (hyperlipidemia) 05/13/2008  . Abdominal pain 08/11/2007  . Uncontrolled type 2 diabetes mellitus with peripheral neuropathy ([place]) 05/26/2007  . Essential hypertension 05/26/2007  . SCOLIOSIS 05/26/2007  . Tremor 05/26/2007  . COLONIC POLYPS, HX OF 05/26/2007   Past Medical History:  Past Medical History:  Diagnosis Date  . Basal cell carcinoma    multiple  . Cataracts, both eyes 2019   surgery pending April 2020  . Complication of anesthesia    post-operative cognitive dysfunction after ACDF on 05/01/11 [facility])  . Deaf    right ear  . Diabetes mellitus   . DJD (degenerative joint disease)    lumbar spine  . Gait abnormality 11/12/2019  . Hearing impaired    hearing aids-transmitter rt   . History of blood transfusion    as a baby in 86  . History of colonic polyps   . Hyperlipidemia   . Hypertension    pt. denies high blood pressure  . Kidney  stone 1967  . Memory change 12/17/2017  . Nose fracture    3x  . OCD (obsessive compulsive disorder)   . Stroke [facility])    "mini stroke, that's what gave me the tremors"  . Stuttering   . Tremor, essential   . Vertigo    random  . Wears dentures    full upper  . Wears glasses    Past Surgical History:  Past Surgical History:  Procedure Laterality Date  . APPENDECTOMY  1960  . BACK SURGERY  2012   lumbar surgery  . CATARACT EXTRACTION W/PHACO Right 04/12/2019   Procedure: CATARACT EXTRACTION PHACO AND INTRAOCULAR LENS PLACEMENT (IOC) RIGHT, DIABETIC;  Surgeon: [name], MD;  Location: [facility];  Service: Ophthalmology;  Laterality: Right;  Diabetic - insulin and oral meds  . CATARACT EXTRACTION W/PHACO Left 05/10/2019   Procedure: CATARACT EXTRACTION PHACO AND INTRAOCULAR LENS PLACEMENT (IOC)  LEFT DIABETIC  00:25.6  14.4%  3.75;  Surgeon: [name], MD;  Location: [facility];  Service: Ophthalmology;  Laterality: Left;  Diabetic - insulin and oral meds  . CERVICAL FUSION  1990  . CERVICAL FUSION  8/12   multiple levels with plates  . CHOLESTEATOMA EXCISION  1993  . COLLATERAL LIGAMENT REPAIR, ELBOW     bilateral, nerve improvement left 08/04, right 09/04  . COLONOSCOPY    . CYST EXCISION     back of cervical spine total of 5 surgeries  . EXTERNAL EAR SURGERY  multiple ear surguries as a child  . OTHER SURGICAL HISTORY     benign head tumor#5  . OTHER SURGICAL HISTORY     sebaceous cysts-post neck x5  . SHOULDER ARTHROSCOPY W/ ACROMIAL REPAIR  08/2004   left shoulder  . TONSILLECTOMY    . ULNAR NERVE TRANSPOSITION Right 05/13/2013   Procedure: RIGHT ULNAR NERVE DECOMPRESSION/TRANSPOSITION;  Surgeon: [name]., MD;  Location: [facility];  Service: Orthopedics;  Laterality: Right;   HPI:  Pt is a 74 y.o. male with a known history of type diabetes mellitus, hypertension, dyslipidemia and hearing impairment, who  presented to the emergency room with acute onset of abnormal speech.  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 No hemorrhage or mass effect". CXR clear.  Prior to admit, pt was living at home I w/ all ADLs.    Assessment / Plan / Recommendation Clinical Impression  The patient is presenting with moderate-severe aphasia.  The patient has an acute left MCA CVA with past medical history significant for anxiety, OCD, and stuttering per chart review. The patient is awake, able to maintain attention, and follow simple commands.  The patient presents with limited spontaneous speech in structured language sample.  Patient does attempt to use complete sentences of normal length and complexity when initiating communication.  However, speech has marked anomia, semantic paraphasia, and false start self-corrections.  Word comprehension is relatively intact, identified 6/8 pictures. Sentence comprehension is impaired, answers yes/no questions with 50% accuracy.  Picture naming is relatively intact, named 6/8 pictures.  Errors include perseveration and semantic paraphasia. In repetition tasks, the patient repeats multi-syllabic words but not phrases/sentences.  Reading aloud Is relatively intact for short written information, accurately reads single words and over 50% of sentences. Motor speech is relatively spared; there is no dysarthria and mild oral apraxia.  His functional communication is significantly impacted by his level of anxiety and feeling of confusion secondary aphasia. The patient will benefit from outpatient speech therapy for restorative and compensatory treatment of aphasia.    SLP Assessment  SLP Recommendation/Assessment: Patient needs continued Speech Lanaguage Pathology Services SLP Visit Diagnosis: Aphasia (R47.01)    Follow Up Recommendations  Outpatient SLP    Frequency and Duration min 2x/week  2 weeks      SLP Evaluation Cognition  Overall Cognitive Status:  Difficult to assess(Significant aphasia)       Comprehension  Auditory Comprehension Overall Auditory Comprehension: Impaired Reading Comprehension Reading Status: Impaired    Expression Expression Primary Mode of Expression: Verbal Verbal Expression Overall Verbal Expression: Impaired   Oral / Motor  Oral Motor/Sensory Function Overall Oral Motor/Sensory Function: Within functional limits Motor Speech Overall Motor Speech: Appears within functional limits for tasks assessed Respiration: Within functional limits Phonation: Normal Resonance: Within functional limits Articulation: Within functional limitis   GO                   Leroy Sea, MS/CCC- SLP  Valetta Fuller, Susie 12/08/2019, 2:39 PM

## 2019-12-08 NOTE — Progress Notes (Addendum)
Progress Note    Walter Harris  [id number] DOB: 1945-11-06  DOA: 12/06/2019 PCP: [name], MD      Brief Narrative:    Medical records reviewed and are as summarized below:  Walter Harris is an 74 y.o. male       Assessment/Plan:   Active Problems:   CVA (cerebral vascular accident) ([place])   Stroke (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  No prior known history of A. fib.  Echo showed EF of 65 to 70%.  A1c is 9.6%.  LDL 104.  Patient presented well outside window for tPA. --2D echo showed EF estimated at 65 to [id number], grade 1 diastolic dysfunction, carotid duplex showed mild (1 to 49%) stenosis of proximal left ICA. --Dual antiplatelet therapy with aspirin and Plavix for 3 weeks, then aspirin 81 daily --Continue Zetia --PT, OT and SLP --Telemetry monitoring, with ambulatory cardiac monitor on discharge if no events revealed on telemetry during admission --Neuro checks --Hold Mobic --Glycemic control, goal A1c less than 7.0% --Neurology following  Type 2 diabetes -A1c 9.6%.  Uncontrolled. --Hold Metformin --Continue Glucotrol XL and Invokana --Continue basal coverage --Sliding scale NovoLog  Hypertensive urgency --Attempt adequate BP control given acute stroke.  Continue propranolol.  Resume home dose of irbesartan.  Add low-dose amlodipine and monitor BP closely  Hyperlipidemia -patient has statin allergy. --Continue home Zetia   Body mass index is 27.06 kg/m.   Family Communication/Anticipated D/C date and plan/Code Status   DVT prophylaxis: Lovenox Code Status: DNR  family Communication: Plan discussed with the sister at the bedside Disposition Plan: Patient is from home.  Plan to discharge to home tomorrow if BP improves.     Subjective:   Patient does not verbalize any complaints.  His sister is at the bedside and she said patient has been very forgetful  Objective:    Vitals:     12/08/19 0508 12/08/19 0737 12/08/19 0840 12/08/19 1106  BP: (!) 161/93 (!) 163/112 (!) 181/99 (!) 161/106  Pulse: 88 86 92 89  Resp: 20 19  19   Temp: 97.6 F (36.4 C)   98.1 F (36.7 C)  TempSrc: Oral   Oral  SpO2: 97% 98%  97%  Weight:      Height:        Intake/Output Summary (Last 24 hours) at 12/08/2019 1636 Last data filed at 12/08/2019 1030 Gross per 24 hour  Intake 600 ml  Output 700 ml  Net -100 ml   Filed Weights   12/06/19 2206 12/07/19 0435 12/08/19 0208  Weight: 81.3 kg 82.1 kg 80.7 kg    Exam:  GEN: NAD SKIN: No rash EYES: EOMI ENT: MMM CV: RRR PULM: CTA B ABD: soft, ND, NT, +BS CNS: AAO x 3, forgetful and slow to speak, non focal EXT: No edema or tenderness   Data Reviewed:   I have personally reviewed following labs and imaging studies:  Labs: Labs show the following:   Basic Metabolic Panel: Recent Labs  Lab 12/06/19 1747 12/07/19 0429  NA 137 141  K 4.3 3.7  CL 101 108  CO2 23 23  GLUCOSE 135* 85  BUN 22 18  CREATININE 1.19 0.99  CALCIUM 9.5 8.9   GFR Estimated Creatinine Clearance: 64.3 mL/min (by C-G formula based on SCr of 0.99 mg/dL). Liver Function Tests: Recent Labs  Lab 12/07/19 0429  AST 19  ALT 20  ALKPHOS 58  BILITOT 0.9  PROT 6.5  ALBUMIN 3.6   No results for input(s): LIPASE, AMYLASE in the last 168 hours. No results for input(s): AMMONIA in the last 168 hours. Coagulation profile No results for input(s): INR, PROTIME in the last 168 hours.  CBC: Recent Labs  Lab 12/06/19 1747 12/07/19 0429  WBC 7.9 6.0  HGB 15.7 14.8  HCT 46.7 43.5  MCV 88.4 87.9  PLT 213 181   Cardiac Enzymes: No results for input(s): CKTOTAL, CKMB, CKMBINDEX, TROPONINI in the last 168 hours. BNP (last 3 results) No results for input(s): PROBNP in the last 8760 hours. CBG: Recent Labs  Lab 12/07/19 1155 12/07/19 1705 12/07/19 2102 12/08/19 0815 12/08/19 1128  GLUCAP 111* 151* 112* 136* 147*   D-Dimer: No results for  input(s): DDIMER in the last 72 hours. Hgb A1c: Recent Labs    12/06/19 1747 12/07/19 0429  HGBA1C 9.6* 9.6*   Lipid Profile: Recent Labs    12/07/19 0429  CHOL 169  HDL 30*  LDLCALC 104*  TRIG 177*  CHOLHDL 5.6   Thyroid function studies: No results for input(s): TSH, T4TOTAL, T3FREE, THYROIDAB in the last 72 hours.  Invalid input(s): FREET3 Anemia work up: No results for input(s): VITAMINB12, FOLATE, FERRITIN, TIBC, IRON, RETICCTPCT in the last 72 hours. Sepsis Labs: Recent Labs  Lab 12/06/19 1747 12/07/19 0429  WBC 7.9 6.0    Microbiology Recent Results (from the past 240 hour(s))  SARS CORONAVIRUS 2 (TAT 6-24 HRS) Nasopharyngeal Nasopharyngeal Swab     Status: None   Collection Time: 12/06/19  5:47 PM   Specimen: Nasopharyngeal Swab  Result Value Ref Range Status   SARS Coronavirus 2 NEGATIVE NEGATIVE Final    Comment: (NOTE) SARS-CoV-2 target nucleic acids are NOT DETECTED. The SARS-CoV-2 RNA is generally detectable in upper and lower respiratory specimens during the acute phase of infection. Negative results do not preclude SARS-CoV-2 infection, do not rule out co-infections with other pathogens, and should not be used as the sole basis for treatment or other patient management decisions. Negative results must be combined with clinical observations, patient history, and epidemiological information. The expected result is Negative. Fact Sheet for Patients: SugarRoll.be Fact Sheet for Healthcare Providers: https://www.woods-mathews.com/ This test is not yet approved or cleared by the [place] FDA and  has been authorized for detection and/or diagnosis of SARS-CoV-2 by FDA under an Emergency Use Authorization (EUA). This EUA will remain  in effect (meaning this test can be used) for the duration of the COVID-19 declaration under Section 56 4(b)(1) of the Act, 21 U.S.C. section 360bbb-3(b)(1), unless the  authorization is terminated or revoked sooner. Performed at [facility], [place] [address] [place], [place] [address]     Procedures and diagnostic studies:  DG Chest 2 View  Result Date: 12/06/2019 CLINICAL DATA:  Worsening altered level of consciousness EXAM: CHEST - 2 VIEW COMPARISON:  05/02/2014 FINDINGS: The heart size and mediastinal contours are within normal limits. Both lungs are clear. The visualized skeletal structures are unremarkable. IMPRESSION: No active cardiopulmonary disease. Electronically Signed   By: Randa Ngo M.D.   On: 12/06/2019 18:39   MR BRAIN WO CONTRAST  Result Date: 12/06/2019 CLINICAL DATA:  Speech difficulty. EXAM: MRI HEAD WITHOUT CONTRAST TECHNIQUE: Multiplanar, multiecho pulse sequences of the brain and surrounding structures were obtained without intravenous contrast. COMPARISON:  Brain MRI 10/06/2017 FINDINGS: Brain: Cortical and subcortical area of abnormal diffusion restriction within the left frontal lobe. No other site of acute ischemia. No midline shift  or other mass effect. No acute hemorrhage. Multifocal white matter hyperintensity, most commonly due to chronic ischemic microangiopathy. There is generalized atrophy without lobar predilection. No chronic microhemorrhage. Normal midline structures. Vascular: Normal flow voids. Skull and upper cervical spine: Remote right mastoidectomy. Sinuses/Orbits: Paranasal sinuses are clear. Bilateral ocular lens replacements. Other: None IMPRESSION: Acute infarct within the anterior left MCA territory. No hemorrhage or mass effect. Electronically Signed   By: Ulyses Jarred M.D.   On: 12/06/2019 22:00   US Carotid Bilateral (at [facility] and [facility] only)  Result Date: 12/07/2019 CLINICAL DATA:  74 year old male with symptoms of cerebrovascular accident EXAM: BILATERAL CAROTID DUPLEX ULTRASOUND TECHNIQUE: Pearline Cables scale imaging, color Doppler and duplex ultrasound were performed of bilateral carotid and vertebral arteries  in the neck. COMPARISON:  None. FINDINGS: Criteria: Quantification of carotid stenosis is based on velocity parameters that correlate the residual internal carotid diameter with NASCET-based stenosis levels, using the diameter of the distal internal carotid lumen as the denominator for stenosis measurement. The following velocity measurements were obtained: RIGHT ICA: 117/40 cm/sec CCA: 0000000 cm/sec SYSTOLIC ICA/CCA RATIO:  1.6 ECA:  169 cm/sec LEFT ICA: 108/39 cm/sec CCA: [id number] cm/sec SYSTOLIC ICA/CCA RATIO:  1.3 ECA:  125 cm/sec RIGHT CAROTID ARTERY: Intimal medial thickening. No significant atherosclerotic plaque or evidence of stenosis in the internal carotid artery. RIGHT VERTEBRAL ARTERY:  Patent with normal antegrade flow. LEFT CAROTID ARTERY: Intimal medial thickening. Mild focal atherosclerotic plaque in the proximal internal carotid artery. By peak systolic velocity criteria, the estimated stenosis is less than 50%. LEFT VERTEBRAL ARTERY:  Patent with normal antegrade flow. IMPRESSION: 1. Mild (1-49%) stenosis proximal left internal carotid artery secondary to mild focal heterogeneous atherosclerotic plaque. 2. Intimal medial thickening is present bilaterally. 3. Vertebral arteries are patent with normal antegrade flow. Signed, Criselda Peaches, MD, [facility]iology Electronically Signed   By: Jacqulynn Cadet M.D.   On: 12/07/2019 14:20   ECHOCARDIOGRAM COMPLETE  Result Date: 12/07/2019    ECHOCARDIOGRAM REPORT   Patient Name:   HARTLEY HISLE Date of Exam: 12/07/2019 Medical Rec #:  [id number]      Height:       68.0 in Accession #:    [id number]     Weight:       181.0 lb Date of Birth:  04/24/1946      BSA:          1.959 m Patient Age:    53 years       BP:           134/66 mmHg Patient Gender: M              HR:           93 bpm. Exam Location:  [facility] Procedure: 2D Echo, Color Doppler and Cardiac Doppler Indications:     I163.9 Stroke   History:         Patient has prior history of Echocardiogram examinations.                  Signs/Symptoms:Altered mental status; Risk                  Factors:Hypertension, HLD and Diabetes.  Sonographer:     Charmayne Sheer RDCS (AE) Referring Phys:  [id number] Arvella Merles MANSY Diagnosing Phys: Harrell Gave End MD  Sonographer Comments: Technically difficult study due to poor echo windows and no subcostal window. Image acquisition challenging due to patient body habitus.  IMPRESSIONS  1. Left ventricular ejection fraction, by estimation, is 65 to 70%. The left ventricle has normal function. The left ventricle has no regional wall motion abnormalities. Left ventricular diastolic parameters are consistent with Grade I diastolic dysfunction (impaired relaxation).  2. Right ventricular systolic function is normal. The right ventricular size is not well visualized. Tricuspid regurgitation signal is inadequate for assessing PA pressure.  3. The mitral valve is grossly normal. No evidence of mitral valve regurgitation. No evidence of mitral stenosis.  4. The aortic valve was not well visualized. Aortic valve regurgitation is not visualized. No aortic stenosis is present. FINDINGS  Left Ventricle: Left ventricular ejection fraction, by estimation, is 65 to 70%. The left ventricle has normal function. The left ventricle has no regional wall motion abnormalities. The left ventricular internal cavity size was normal in size. There is  no left ventricular hypertrophy. Left ventricular diastolic parameters are consistent with Grade I diastolic dysfunction (impaired relaxation). Right Ventricle: The right ventricular size is not well visualized. No increase in right ventricular wall thickness. Right ventricular systolic function is normal. Tricuspid regurgitation signal is inadequate for assessing PA pressure. Left Atrium: Left atrial size was normal in size. Right Atrium: Right atrial size was not well visualized. Pericardium: The  pericardium was not well visualized. Mitral Valve: The mitral valve is grossly normal. No evidence of mitral valve regurgitation. No evidence of mitral valve stenosis. MV peak gradient, 3.3 mmHg. The mean mitral valve gradient is 2.0 mmHg. Tricuspid Valve: The tricuspid valve is grossly normal. Tricuspid valve regurgitation is not demonstrated. Aortic Valve: The aortic valve was not well visualized. Aortic valve regurgitation is not visualized. No aortic stenosis is present. Aortic valve mean gradient measures 2.0 mmHg. Aortic valve peak gradient measures 4.4 mmHg. Aortic valve area, by VTI measures 2.00 cm. Pulmonic Valve: The pulmonic valve was not well visualized. Pulmonic valve regurgitation is not visualized. No evidence of pulmonic stenosis. Aorta: The aortic root is normal in size and structure. Pulmonary Artery: The pulmonary artery is of normal size. Venous: The inferior vena cava was not well visualized. IAS/Shunts: The interatrial septum was not well visualized.  LEFT VENTRICLE PLAX 2D LVIDd:         4.16 cm  Diastology LVIDs:         2.46 cm  LV e' lateral:   4.79 cm/s LV PW:         0.88 cm  LV E/e' lateral: 13.7 LV IVS:        0.67 cm  LV e' medial:    6.31 cm/s LVOT diam:     2.00 cm  LV E/e' medial:  10.4 LV SV:         34 LV SV Index:   17 LVOT Area:     3.14 cm  LEFT ATRIUM             Index LA diam:        1.90 cm 0.97 cm/m LA Vol (A2C):   30.9 ml 15.77 ml/m LA Vol (A4C):   29.0 ml 14.80 ml/m LA Biplane Vol: 29.3 ml 14.96 ml/m  AORTIC VALVE                   PULMONIC VALVE AV Area (Vmax):    2.15 cm    PV Vmax:       0.95 m/s AV Area (Vmean):   2.11 cm    PV Vmean:      67.000 cm/s AV Area (VTI):  2.00 cm    PV VTI:        0.164 m AV Vmax:           105.00 cm/s PV Peak grad:  3.6 mmHg AV Vmean:          67.600 cm/s PV Mean grad:  2.0 mmHg AV VTI:            0.171 m AV Peak Grad:      4.4 mmHg AV Mean Grad:      2.0 mmHg LVOT Vmax:         71.70 cm/s LVOT Vmean:        45.400 cm/s LVOT  VTI:          0.109 m LVOT/AV VTI ratio: 0.64  AORTA Ao Root diam: 3.40 cm MITRAL VALVE MV Area (PHT): 5.66 cm    SHUNTS MV Peak grad:  3.3 mmHg    Systemic VTI:  0.11 m MV Mean grad:  2.0 mmHg    Systemic Diam: 2.00 cm MV Vmax:       0.91 m/s MV Vmean:      62.0 cm/s MV Decel Time: 134 msec MV E velocity: 65.50 cm/s MV A velocity: 96.50 cm/s MV E/A ratio:  0.68 Christopher End MD Electronically signed by Nelva Bush MD Signature Date/Time: 12/07/2019/10:38:42 AM    Final     Medications:   .  stroke: mapping our early stages of recovery book   Does not apply Once  . ALPRAZolam  0.25 mg Oral TID  . aspirin EC  81 mg Oral Daily  . canagliflozin  300 mg Oral QAC breakfast  . clopidogrel  75 mg Oral Daily  . enoxaparin (LOVENOX) injection  40 mg Subcutaneous Q24H  . insulin aspart  0-9 Units Subcutaneous TID PC & HS  . irbesartan  150 mg Oral Daily  . propranolol ER  60 mg Oral Daily  . sodium chloride flush  3 mL Intravenous Once   Continuous Infusions:   LOS: 1 day   Jhordan Mckibben  [facility]     12/08/2019, 4:36 PM

## 2019-12-09 ENCOUNTER — Telehealth: Payer: Self-pay

## 2019-12-09 LAB — GLUCOSE, CAPILLARY: Glucose-Capillary: 163 mg/dL — ABNORMAL HIGH (ref 70–99)

## 2019-12-09 MED ORDER — AMLODIPINE BESYLATE 5 MG PO TABS: 5.0000 mg | ORAL_TABLET | Freq: Every day | ORAL | 0 refills | Status: DC

## 2019-12-09 MED ORDER — TRAZODONE HCL 50 MG PO TABS: 50.0000 mg | ORAL_TABLET | Freq: Every day | ORAL | Status: DC

## 2019-12-09 MED ORDER — ASPIRIN 81 MG PO TBEC: 81.0000 mg | DELAYED_RELEASE_TABLET | Freq: Every day | ORAL | Status: DC

## 2019-12-09 MED ORDER — ALPRAZOLAM 0.5 MG PO TABS: 0.5000 mg | ORAL_TABLET | Freq: Every day | ORAL | Status: DC | PRN

## 2019-12-09 MED ORDER — AMLODIPINE BESYLATE 5 MG PO TABS
5.0000 mg | ORAL_TABLET | Freq: Every day | ORAL | Status: DC
  Administered 2019-12-09: 5 mg via ORAL
  Filled 2019-12-09: qty 1

## 2019-12-09 MED ORDER — CLOPIDOGREL BISULFATE 75 MG PO TABS: 75.0000 mg | ORAL_TABLET | Freq: Every day | ORAL | 0 refills | Status: DC

## 2019-12-09 NOTE — Progress Notes (Signed)
Patient being discharged today. Patient given discharge instructions with wife at bedside. All questions answered. IV taken out and tele monitor taken off. Wife verbalized understanding. Patient discharging home in stable condition.

## 2019-12-09 NOTE — Plan of Care (Signed)
  Problem: Safety: Goal: Ability to remain free from injury will improve Outcome: Progressing   

## 2019-12-09 NOTE — Plan of Care (Signed)
  Problem: Education: Goal: Knowledge of General Education information will improve Description: Including pain rating scale, medication(s)/side effects and non-pharmacologic comfort measures Outcome: Adequate for Discharge   Problem: Health Behavior/Discharge Planning: Goal: Ability to manage health-related needs will improve Outcome: Adequate for Discharge   Problem: Clinical Measurements: Goal: Ability to maintain clinical measurements within normal limits will improve Outcome: Adequate for Discharge Goal: Will remain free from infection Outcome: Adequate for Discharge Goal: Diagnostic test results will improve Outcome: Adequate for Discharge Goal: Respiratory complications will improve Outcome: Adequate for Discharge Goal: Cardiovascular complication will be avoided Outcome: Adequate for Discharge   Problem: Activity: Goal: Risk for activity intolerance will decrease Outcome: Adequate for Discharge   Problem: Nutrition: Goal: Adequate nutrition will be maintained Outcome: Adequate for Discharge   Problem: Coping: Goal: Level of anxiety will decrease Outcome: Adequate for Discharge   Problem: Elimination: Goal: Will not experience complications related to bowel motility Outcome: Adequate for Discharge Goal: Will not experience complications related to urinary retention Outcome: Adequate for Discharge   Problem: Pain Managment: Goal: General experience of comfort will improve Outcome: Adequate for Discharge   Problem: Safety: Goal: Ability to remain free from injury will improve Outcome: Adequate for Discharge   Problem: Skin Integrity: Goal: Risk for impaired skin integrity will decrease Outcome: Adequate for Discharge   Problem: Education: Goal: Knowledge of disease or condition will improve Outcome: Adequate for Discharge Goal: Knowledge of secondary prevention will improve Outcome: Adequate for Discharge Goal: Knowledge of patient specific risk factors  addressed and post discharge goals established will improve Outcome: Adequate for Discharge Goal: Individualized Educational Video(s) Outcome: Adequate for Discharge   Problem: Coping: Goal: Will verbalize positive feelings about self Outcome: Adequate for Discharge Goal: Will identify appropriate support needs Outcome: Adequate for Discharge   Problem: Health Behavior/Discharge Planning: Goal: Ability to manage health-related needs will improve Outcome: Adequate for Discharge   Problem: Self-Care: Goal: Ability to participate in self-care as condition permits will improve Outcome: Adequate for Discharge Goal: Verbalization of feelings and concerns over difficulty with self-care will improve Outcome: Adequate for Discharge Goal: Ability to communicate needs accurately will improve Outcome: Adequate for Discharge   Problem: Nutrition: Goal: Risk of aspiration will decrease Outcome: Adequate for Discharge Goal: Dietary intake will improve Outcome: Adequate for Discharge   Problem: Intracerebral Hemorrhage Tissue Perfusion: Goal: Complications of Intracerebral Hemorrhage will be minimized Outcome: Adequate for Discharge   Problem: Ischemic Stroke/TIA Tissue Perfusion: Goal: Complications of ischemic stroke/TIA will be minimized Outcome: Adequate for Discharge   Problem: Spontaneous Subarachnoid Hemorrhage Tissue Perfusion: Goal: Complications of Spontaneous Subarachnoid Hemorrhage will be minimized Outcome: Adequate for Discharge   

## 2019-12-09 NOTE — Progress Notes (Signed)
[place] attempted visit per speech therapy informal referral on Tuesday.  ST mentioned pt. has been emotional following recent stroke and might benefit from chaplains' support. Two visitors left pt.'s rm. just as [place] arrived; pt. walking around rm. somewhat restlessly, w/NT sitter monitoring.  Pt. approached [place] and asked, "Who are you?" CH identified himself, but pt. turned away and walked back to bed.  [place] assesses a visit tomorrow might be better.  Will attempt to follow up if possible.    12/08/19 1530  Clinical Encounter Type  Visited With Patient  Visit Type Initial  Referral From Other (Comment) (Speech Therapy)  Consult/Referral To Chaplain  Spiritual Encounters  Spiritual Needs Emotional

## 2019-12-09 NOTE — TOC Transition Note (Signed)
Transition of Care [facility]) - CM/SW Discharge Note   Patient Details  Name: Walter Harris MRN: [id number] Date of Birth: 03-03-1946  [facility] CM/SW Contact:  Victorino Dike, RN Phone Number: 12/09/2019, 11:11 AM   Clinical Narrative:     Outpatient referral sent for PT/OT [facility] outpatient. No further TOC needs at this time, please re-consult for new needs.     Final next level of care: OP Rehab Barriers to Discharge: Barriers Resolved   Patient Goals and CMS Choice        Discharge Placement                       Discharge Plan and Services                                     Social Determinants of Health (SDOH) Interventions     Readmission Risk Interventions No flowsheet data found.

## 2019-12-09 NOTE — Discharge Summary (Signed)
Physician Discharge Summary  [name] [id number] DOB: 07-08-1946 DOA: 12/06/2019  PCP: [name], MD  Admit date: 12/06/2019 Discharge date: 12/09/2019  Discharge disposition: Home   Recommendations for Outpatient Follow-Up:   Outpatient physical therapy and Occupational Therapy Follow-up with PCP in 1 week   Discharge Diagnosis:   Active Problems:   CVA (cerebral vascular accident) [facility])   Stroke [facility])    Discharge Condition: Stable.  Diet recommendation: Low-salt diet  Code status: Full code.    Hospital Course:   Mr. brysten [age] y.o.Caucasian malewith a known history of type diabetes mellitus, hypertension, dyslipidemia and hearing impairment, who presented to the ED on 3/29 with memory impairment, abnormal speech with expressive dysphasia and stuttering that started on 3/27.  He was found to have an acute left MCA stroke.  In the ED, hypertensive, 167/116, other vitals were normal.  BMP and CBC were unremarkable.  Troponin was normal.  EKG showed normal sinus rhythm and rate of 88 with left axis deviation, T wave inversion in V1 and poor R wave progression.  Noncontrast head CT showed an acute versus subacute left MCA territory infarct involving the left frontal lobe, in addition to chronic atrophy and microvascular ischemic changes.  Subsequent MRI showed acute left MCA stroke.  He was seen by the neurologist who recommended dual antiplatelet therapy with aspirin and Plavix for 3 weeks followed by aspirin monotherapy.  PT and OT recommended outpatient physical and occupational therapy.  He had uncontrolled hypertension so amlodipine was added to his home regimen of propranolol and irbesartan for adequate BP control.  His glucose levels were adequately controlled in the hospital even without insulin.  His home dose of Lantus was therefore discontinued at discharge.  However, the patient and his wife were informed that he needs to continue  monitoring his glucose levels closely and follow-up with his PCP for further recommendations and glucose management.      Discharge Exam:   Vitals:   12/09/19 0603 12/09/19 0808  BP: (!) 145/101 (!) 135/94  Pulse: (!) 113 93  Resp: 18 18  Temp: 98.1 F (36.7 C) 97.6 F (36.4 C)  SpO2: 95% 98%   Vitals:   12/08/19 1640 12/08/19 1957 12/09/19 0603 12/09/19 0808  BP: (!) 167/108 (!) 153/113 (!) 145/101 (!) 135/94  Pulse: 95 (!) 105 (!) 113 93  Resp: 20 18 18 18   Temp: 97.8 F (36.6 C) 98.4 F (36.9 C) 98.1 F (36.7 C) 97.6 F (36.4 C)  TempSrc: Oral Oral Oral Oral  SpO2: 98% 92% 95% 98%  Weight:   80 kg   Height:         GEN: NAD SKIN: No rash EYES: EOMI ENT: MMM CV: RRR PULM: CTA B ABD: soft, ND, NT, +BS CNS: AAO x 3, non focal EXT: No edema or tenderness   The results of significant diagnostics from this hospitalization (including imaging, microbiology, ancillary and laboratory) are listed below for reference.     Procedures and Diagnostic Studies:   US Carotid Bilateral (at [facility] and [facility] only)  Result Date: 12/07/2019 CLINICAL DATA:  74 year old male with symptoms of cerebrovascular accident EXAM: BILATERAL CAROTID DUPLEX ULTRASOUND TECHNIQUE: Pearline Cables scale imaging, color Doppler and duplex ultrasound were performed of bilateral carotid and vertebral arteries in the neck. COMPARISON:  None. FINDINGS: Criteria: Quantification of carotid stenosis is based on velocity parameters that correlate the residual internal carotid diameter with NASCET-based stenosis levels, using the diameter of the distal internal carotid lumen  as the denominator for stenosis measurement. The following velocity measurements were obtained: RIGHT ICA: 117/40 cm/sec CCA: 0000000 cm/sec SYSTOLIC ICA/CCA RATIO:  1.6 ECA:  169 cm/sec LEFT ICA: 108/39 cm/sec CCA: [id number] cm/sec SYSTOLIC ICA/CCA RATIO:  1.3 ECA:  125 cm/sec RIGHT CAROTID ARTERY: Intimal medial thickening. No significant atherosclerotic  plaque or evidence of stenosis in the internal carotid artery. RIGHT VERTEBRAL ARTERY:  Patent with normal antegrade flow. LEFT CAROTID ARTERY: Intimal medial thickening. Mild focal atherosclerotic plaque in the proximal internal carotid artery. By peak systolic velocity criteria, the estimated stenosis is less than 50%. LEFT VERTEBRAL ARTERY:  Patent with normal antegrade flow. IMPRESSION: 1. Mild (1-49%) stenosis proximal left internal carotid artery secondary to mild focal heterogeneous atherosclerotic plaque. 2. Intimal medial thickening is present bilaterally. 3. Vertebral arteries are patent with normal antegrade flow. Signed, Criselda Peaches, MD, [facility] Electronically Signed   By: Jacqulynn Cadet M.D.   On: 12/07/2019 14:20   ECHOCARDIOGRAM COMPLETE  Result Date: 12/07/2019    ECHOCARDIOGRAM REPORT   Patient Name:   Walter Harris Date of Exam: 12/07/2019 Medical Rec #:  [id number]      Height:       68.0 in Accession #:    [id number]     Weight:       181.0 lb Date of Birth:  01/07/1946      BSA:          1.959 m Patient Age:    74 years       BP:           134/66 mmHg Patient Gender: M              HR:           93 bpm. Exam Location:  [facility] Procedure: 2D Echo, Color Doppler and Cardiac Doppler Indications:     I163.9 Stroke  History:         Patient has prior history of Echocardiogram examinations.                  Signs/Symptoms:Altered mental status; Risk                  Factors:Hypertension, HLD and Diabetes.  Sonographer:     Charmayne Sheer RDCS (AE) Referring Phys:  [id number] Arvella Merles MANSY Diagnosing Phys: Harrell Gave End MD  Sonographer Comments: Technically difficult study due to poor echo windows and no subcostal window. Image acquisition challenging due to patient body habitus. IMPRESSIONS  1. Left ventricular ejection fraction, by estimation, is 65 to 70%. The left ventricle has normal function. The left ventricle has no  regional wall motion abnormalities. Left ventricular diastolic parameters are consistent with Grade I diastolic dysfunction (impaired relaxation).  2. Right ventricular systolic function is normal. The right ventricular size is not well visualized. Tricuspid regurgitation signal is inadequate for assessing PA pressure.  3. The mitral valve is grossly normal. No evidence of mitral valve regurgitation. No evidence of mitral stenosis.  4. The aortic valve was not well visualized. Aortic valve regurgitation is not visualized. No aortic stenosis is present. FINDINGS  Left Ventricle: Left ventricular ejection fraction, by estimation, is 65 to 70%. The left ventricle has normal function. The left ventricle has no regional wall motion abnormalities. The left ventricular internal cavity size was normal in size. There is  no left ventricular hypertrophy. Left ventricular diastolic parameters are consistent with Grade I diastolic dysfunction (impaired relaxation). Right  Ventricle: The right ventricular size is not well visualized. No increase in right ventricular wall thickness. Right ventricular systolic function is normal. Tricuspid regurgitation signal is inadequate for assessing PA pressure. Left Atrium: Left atrial size was normal in size. Right Atrium: Right atrial size was not well visualized. Pericardium: The pericardium was not well visualized. Mitral Valve: The mitral valve is grossly normal. No evidence of mitral valve regurgitation. No evidence of mitral valve stenosis. MV peak gradient, 3.3 mmHg. The mean mitral valve gradient is 2.0 mmHg. Tricuspid Valve: The tricuspid valve is grossly normal. Tricuspid valve regurgitation is not demonstrated. Aortic Valve: The aortic valve was not well visualized. Aortic valve regurgitation is not visualized. No aortic stenosis is present. Aortic valve mean gradient measures 2.0 mmHg. Aortic valve peak gradient measures 4.4 mmHg. Aortic valve area, by VTI measures 2.00 cm.  Pulmonic Valve: The pulmonic valve was not well visualized. Pulmonic valve regurgitation is not visualized. No evidence of pulmonic stenosis. Aorta: The aortic root is normal in size and structure. Pulmonary Artery: The pulmonary artery is of normal size. Venous: The inferior vena cava was not well visualized. IAS/Shunts: The interatrial septum was not well visualized.  LEFT VENTRICLE PLAX 2D LVIDd:         4.16 cm  Diastology LVIDs:         2.46 cm  LV e' lateral:   4.79 cm/s LV PW:         0.88 cm  LV E/e' lateral: 13.7 LV IVS:        0.67 cm  LV e' medial:    6.31 cm/s LVOT diam:     2.00 cm  LV E/e' medial:  10.4 LV SV:         34 LV SV Index:   17 LVOT Area:     3.14 cm  LEFT ATRIUM             Index LA diam:        1.90 cm 0.97 cm/m LA Vol (A2C):   30.9 ml 15.77 ml/m LA Vol (A4C):   29.0 ml 14.80 ml/m LA Biplane Vol: 29.3 ml 14.96 ml/m  AORTIC VALVE                   PULMONIC VALVE AV Area (Vmax):    2.15 cm    PV Vmax:       0.95 m/s AV Area (Vmean):   2.11 cm    PV Vmean:      67.000 cm/s AV Area (VTI):     2.00 cm    PV VTI:        0.164 m AV Vmax:           105.00 cm/s PV Peak grad:  3.6 mmHg AV Vmean:          67.600 cm/s PV Mean grad:  2.0 mmHg AV VTI:            0.171 m AV Peak Grad:      4.4 mmHg AV Mean Grad:      2.0 mmHg LVOT Vmax:         71.70 cm/s LVOT Vmean:        45.400 cm/s LVOT VTI:          0.109 m LVOT/AV VTI ratio: 0.64  AORTA Ao Root diam: 3.40 cm MITRAL VALVE MV Area (PHT): 5.66 cm    SHUNTS MV Peak grad:  3.3 mmHg    Systemic VTI:  0.11  m MV Mean grad:  2.0 mmHg    Systemic Diam: 2.00 cm MV Vmax:       0.91 m/s MV Vmean:      62.0 cm/s MV Decel Time: 134 msec MV E velocity: 65.50 cm/s MV A velocity: 96.50 cm/s MV E/A ratio:  0.68 Christopher End MD Electronically signed by Nelva Bush MD Signature Date/Time: 12/07/2019/10:38:42 AM    Final      Labs:   Basic Metabolic Panel: Recent Labs  Lab 12/06/19 1747 12/07/19 0429  NA 137 141  K 4.3 3.7  CL 101 108  CO2  23 23  GLUCOSE 135* 85  BUN 22 18  CREATININE 1.19 0.99  CALCIUM 9.5 8.9   GFR Estimated Creatinine Clearance: 64.3 mL/min (by C-G formula based on SCr of 0.99 mg/dL). Liver Function Tests: Recent Labs  Lab 12/07/19 0429  AST 19  ALT 20  ALKPHOS 58  BILITOT 0.9  PROT 6.5  ALBUMIN 3.6   No results for input(s): LIPASE, AMYLASE in the last 168 hours. No results for input(s): AMMONIA in the last 168 hours. Coagulation profile No results for input(s): INR, PROTIME in the last 168 hours.  CBC: Recent Labs  Lab 12/06/19 1747 12/07/19 0429  WBC 7.9 6.0  HGB 15.7 14.8  HCT 46.7 43.5  MCV 88.4 87.9  PLT 213 181   Cardiac Enzymes: No results for input(s): CKTOTAL, CKMB, CKMBINDEX, TROPONINI in the last 168 hours. BNP: Invalid input(s): POCBNP CBG: Recent Labs  Lab 12/08/19 0815 12/08/19 1128 12/08/19 1652 12/08/19 2101 12/09/19 0801  GLUCAP 136* 147* 169* 108* 163*   D-Dimer No results for input(s): DDIMER in the last 72 hours. Hgb A1c Recent Labs    12/06/19 1747 12/07/19 0429  HGBA1C 9.6* 9.6*   Lipid Profile Recent Labs    12/07/19 0429  CHOL 169  HDL 30*  LDLCALC 104*  TRIG 177*  CHOLHDL 5.6   Thyroid function studies No results for input(s): TSH, T4TOTAL, T3FREE, THYROIDAB in the last 72 hours.  Invalid input(s): FREET3 Anemia work up No results for input(s): VITAMINB12, FOLATE, FERRITIN, TIBC, IRON, RETICCTPCT in the last 72 hours. Microbiology Recent Results (from the past 240 hour(s))  SARS CORONAVIRUS 2 (TAT 6-24 HRS) Nasopharyngeal Nasopharyngeal Swab     Status: None   Collection Time: 12/06/19  5:47 PM   Specimen: Nasopharyngeal Swab  Result Value Ref Range Status   SARS Coronavirus 2 NEGATIVE NEGATIVE Final    Comment: (NOTE) SARS-CoV-2 target nucleic acids are NOT DETECTED. The SARS-CoV-2 RNA is generally detectable in upper and lower respiratory specimens during the acute phase of infection. Negative results do not preclude  SARS-CoV-2 infection, do not rule out co-infections with other pathogens, and should not be used as the sole basis for treatment or other patient management decisions. Negative results must be combined with clinical observations, patient history, and epidemiological information. The expected result is Negative. Fact Sheet for Patients: SugarRoll.be Fact Sheet for Healthcare Providers: https://www.woods-mathews.com/ This test is not yet approved or cleared by the [place] FDA and  has been authorized for detection and/or diagnosis of SARS-CoV-2 by FDA under an Emergency Use Authorization (EUA). This EUA will remain  in effect (meaning this test can be used) for the duration of the COVID-19 declaration under Section 56 4(b)(1) of the Act, 21 U.S.C. section 360bbb-3(b)(1), unless the authorization is terminated or revoked sooner. Performed at [facility], [place] [address], [place], [place] [address]  Discharge Instructions:   Discharge Instructions    Diet - low sodium heart healthy   Complete by: As directed    Diet Carb Modified   Complete by: As directed    Increase activity slowly   Complete by: As directed      Allergies as of 12/09/2019      Reactions   Bee Venom Anaphylaxis   Pravastatin Other (See Comments)   Severe myalgias   Ace Inhibitors    Cough   Actos [pioglitazone] Other (See Comments)   Intolerant, not allergic.    Angiotensin Receptor Blockers Other (See Comments)   cramps   Aripiprazole Other (See Comments)   Unknown reaction many years ago   Crestor [rosuvastatin Calcium] Other (See Comments)   Aches, even with twice weekly dosing   Metformin And Related Other (See Comments)   Able to tolerate XR formulation, not allergic.    Olanzapine Other (See Comments)   Unknown reaction many years ago   Pneumovax 23 [pneumococcal Vac Polyvalent] Other (See Comments)   aches   Primidone Other (See  Comments)   Vertigo at 50mg , able to tolerate 25mg     Codeine Rash   Divalproex Sodium Other (See Comments)    tremor   Lithium Carbonate Other (See Comments)   Tremors   Sulfamethoxazole-trimethoprim Other (See Comments)   Mild reaction per Dr. Silvio Pate, pt does not remember the reaction      Medication List    STOP taking these medications   Lantus SoloStar 100 UNIT/ML Solostar Pen Generic drug: insulin glargine     TAKE these medications   ALPRAZolam 0.5 MG tablet Commonly known as: XANAX Take 1-2 tablets (0.5-1 mg total) by mouth daily as needed for anxiety. What changed:   how much to take  how to take this  when to take this  reasons to take this  additional instructions   amLODipine 5 MG tablet Commonly known as: NORVASC Take 1 tablet (5 mg total) by mouth daily. Start taking on: December 10, 2019   aspirin 81 MG EC tablet Take 1 tablet (81 mg total) by mouth daily. Start taking on: December 10, 2019   canagliflozin 300 MG Tabs tablet Commonly known as: Invokana Take 1 tablet (300 mg total) by mouth daily before breakfast.   clopidogrel 75 MG tablet Commonly known as: PLAVIX Take 1 tablet (75 mg total) by mouth daily. Start taking on: December 10, 2019   glipiZIDE 10 MG 24 hr tablet Commonly known as: GLUCOTROL XL Take 10 mg by mouth daily with breakfast.   irbesartan 150 MG tablet Commonly known as: AVAPRO Take 150 mg by mouth daily.   metFORMIN 500 MG 24 hr tablet Commonly known as: GLUCOPHAGE-XR TAKE ONE TABLET 3 TIMES DAILY   oxyCODONE-acetaminophen 5-325 MG tablet Commonly known as: PERCOCET/ROXICET TAKE ONE TO TWO TABLETS BY MOUTH 3 TIMES DAILY IF NEEDED FOR PAIN   Pen Needles 32G X 4 MM Misc Inject 1 application as directed daily.   propranolol ER 60 MG 24 hr capsule Commonly known as: INDERAL LA TAKE 1 CAPSULE EVERY DAY   tiZANidine 4 MG tablet Commonly known as: Zanaflex Take 1 tablet (4 mg total) by mouth every 8 (eight) hours as needed  for muscle spasms. With an extra dose daily as needed.   traZODone 50 MG tablet Commonly known as: DESYREL Take 1 tablet (50 mg total) by mouth at bedtime. TAKE 1 AND 1/2 TABLETS BY MOUTH AT BEDTIME AS NEEDED FOR SLEEP  Time coordinating discharge: 28 minutes  Signed:  Prabhleen Montemayor  [facility] 12/09/2019, 10:05 AM

## 2019-12-09 NOTE — Telephone Encounter (Signed)
Transition Care Management Follow-up Telephone Call  Date of discharge and from where: 12/09/2019, [facility]   How have you been since you were released from the hospital? Patient is doing much better since he has gotten home. No complaints at this time.   Any questions or concerns? No   Items Reviewed:  Did the pt receive and understand the discharge instructions provided? Yes   Medications obtained and verified? Yes   Any new allergies since your discharge? No   Dietary orders reviewed? Yes  Do you have support at home? Yes   Functional Questionnaire: (I = Independent and D = Dependent) ADLs: D  Bathing/Dressing- I  Meal Prep- D  Eating- I  Maintaining continence- I  Transferring/Ambulation- I  Managing Meds- I  Follow up appointments reviewed:   PCP Hospital f/u appt confirmed? Yes  Scheduled to see Dr. [name] on 4/9/201 @ 12 pm.  [facility] f/u appt confirmed? N/A   Are transportation arrangements needed? No   If their condition worsens, is the pt aware to call PCP or go to the Emergency Dept.? Yes  Was the patient provided with contact information for the PCP's office or ED? Yes  Was to pt encouraged to call back with questions or concerns? Yes

## 2019-12-10 NOTE — Telephone Encounter (Signed)
Noted. Thanks.

## 2019-12-15 ENCOUNTER — Ambulatory Visit: Payer: PPO

## 2019-12-15 ENCOUNTER — Other Ambulatory Visit: Payer: Self-pay

## 2019-12-15 ENCOUNTER — Encounter: Payer: Self-pay | Admitting: Occupational Therapy

## 2019-12-15 ENCOUNTER — Ambulatory Visit: Payer: PPO | Attending: Family Medicine | Admitting: Occupational Therapy

## 2019-12-15 DIAGNOSIS — R278 Other lack of coordination: Secondary | ICD-10-CM

## 2019-12-15 DIAGNOSIS — R4701 Aphasia: Secondary | ICD-10-CM | POA: Insufficient documentation

## 2019-12-15 DIAGNOSIS — R2689 Other abnormalities of gait and mobility: Secondary | ICD-10-CM | POA: Diagnosis not present

## 2019-12-15 DIAGNOSIS — M6281 Muscle weakness (generalized): Secondary | ICD-10-CM | POA: Diagnosis not present

## 2019-12-15 NOTE — Progress Notes (Signed)
  Physician Documentation  Your signature is required to indicate approval of the treatment plan as stated above. By signing this report, you are approving the plan of care. Please sign and either send electronically or print and fax the signed copy to the number below. If you approve with modifications, please indicate those in the space provided.___________ Physician Signature: ______Graham [name] ____  Date:__04/07/21 _____ Time:__5:32 PM ___

## 2019-12-15 NOTE — Therapy (Signed)
[place] MAIN [facility] SERVICES [address] [place], [place], [address] Phone: [phone]   Fax:  [phone]  Occupational Therapy Evaluation  Patient Details  Name: Walter Harris MRN: [id number] Date of Birth: 1946-04-01 Referring Provider (OT): [name]   Encounter Date: 12/15/2019  OT End of Session - 12/15/19 1141    Visit Number  1    Number of Visits  1    Date for OT Re-Evaluation  12/15/19    OT Start Time  1000    OT Stop Time  1045    OT Time Calculation (min)  45 min    Activity Tolerance  Patient tolerated treatment well    Behavior During Therapy  Restless;Impulsive       Past Medical History:  Diagnosis Date  . Basal cell carcinoma    multiple  . Cataracts, both eyes 2019   surgery pending April 2020  . Complication of anesthesia    post-operative cognitive dysfunction after ACDF on 05/01/11 [facility])  . Deaf    right ear  . Diabetes mellitus   . DJD (degenerative joint disease)    lumbar spine  . Gait abnormality 11/12/2019  . Hearing impaired    hearing aids-transmitter rt   . History of blood transfusion    as a baby in 58  . History of colonic polyps   . Hyperlipidemia   . Hypertension    pt. denies high blood pressure  . Kidney stone 1967  . Memory change 12/17/2017  . Nose fracture    3x  . OCD (obsessive compulsive disorder)   . Stroke [facility])    "mini stroke, that's what gave me the tremors"  . Stuttering   . Tremor, essential   . Vertigo    random  . Wears dentures    full upper  . Wears glasses     Past Surgical History:  Procedure Laterality Date  . APPENDECTOMY  1960  . BACK SURGERY  2012   lumbar surgery  . CATARACT EXTRACTION W/PHACO Right 04/12/2019   Procedure: CATARACT EXTRACTION PHACO AND INTRAOCULAR LENS PLACEMENT (IOC) RIGHT, DIABETIC;  Surgeon: [name], MD;  Location: [place];  Service: Ophthalmology;  Laterality: Right;  Diabetic - insulin and oral meds  .  CATARACT EXTRACTION W/PHACO Left 05/10/2019   Procedure: CATARACT EXTRACTION PHACO AND INTRAOCULAR LENS PLACEMENT (IOC)  LEFT DIABETIC  00:25.6  14.4%  3.75;  Surgeon: [name], MD;  Location: [place];  Service: Ophthalmology;  Laterality: Left;  Diabetic - insulin and oral meds  . CERVICAL FUSION  1990  . CERVICAL FUSION  8/12   multiple levels with plates  . CHOLESTEATOMA EXCISION  1993  . COLLATERAL LIGAMENT REPAIR, ELBOW     bilateral, nerve improvement left 08/04, right 09/04  . COLONOSCOPY    . CYST EXCISION     back of cervical spine total of 5 surgeries  . EXTERNAL EAR SURGERY     multiple ear surguries as a child  . OTHER SURGICAL HISTORY     benign head tumor#5  . OTHER SURGICAL HISTORY     sebaceous cysts-post neck x5  . SHOULDER ARTHROSCOPY W/ ACROMIAL REPAIR  08/2004   left shoulder  . TONSILLECTOMY    . ULNAR NERVE TRANSPOSITION Right 05/13/2013   Procedure: RIGHT ULNAR NERVE DECOMPRESSION/TRANSPOSITION;  Surgeon: [name]., MD;  Location: [place];  Service: Orthopedics;  Laterality: Right;    There were  no vitals filed for this visit.  Subjective Assessment - 12/15/19 1118    Subjective   Pt. was present with his wife. Pt. wife reports that she thought he was receiving speech therapy services, as he is fine with everything else at home.    Patient is accompanied by:  Family member    Pertinent History  Pt. is a 74 y.o. male who was admitted to [facility] 12/06/19 through 12/09/2019 with a CVA. Pt. is HOH. Pt. has aphasia, and  history of Tremors, DM,  HTN, and chronic back pain.    Currently in Pain?  No/denies        [facility] OT Assessment - 12/15/19 1118      Assessment   Medical Diagnosis  CVA   CVA   Referring Provider (OT)  [name]    Onset Date/Surgical Date  12/06/19    Hand Dominance  Right      Restrictions   Weight Bearing Restrictions  No      Balance Screen   Has the patient fallen in the past 6 months  No       Home  Environment   Family/patient expects to be discharged to:  Private residence    Living Arrangements  Spouse/significant other    Available Help at Discharge  Friend(s)    Type of [place]  One level    Alternate Level Stairs - Number of Steps  3    Bathroom Shower/Tub  Walk-in Shower    Bathroom Accessibility  Yes    Home Equipment  [place] - 2 wheels;Cane - single point;Tub bench;Grab bars - tub/shower;Wheelchair - manual    Lives With  Spouse      Prior Function   Level of Independence  Independent    Vocation  Retired    Teaching laboratory technician   No longer engages in     ADL   Eating/Feeding  Independent    Grooming  Independent    Environmental health practitioner  Modified independent      IADL   Prior Level of Function Shopping  Pt. wife performed   Pt. wife performed   Shopping  Needs to be accompanied on any shopping trip    Prior Level of Function Light Housekeeping  Independent with yardwork   Independent with yard work   Nurse, adult  Does not participate in any housekeeping tasks   Mows yard only. Pt. wife has done all homemaking tasks.   Prior Level of Function Meal Prep  Wife performs meal pre    Community Mobility  Relies on family or friends for transportation    Prior Level of Function Medication Managment  Wife assists pt.    Medication Management  Is not capable of dispensing or managing own medication    Prior Level of Function Financial Management  Wife completed    Financial Management  Dependent      Written Expression   Dominant Hand  Right      Vision - History   Baseline Vision  Wears glasses only for reading    Visual History  Cataracts   Recent cataract removal.     Cognition   Overall Cognitive Status  History  of cognitive  impairments - at baseline    Behaviors  Verbal agitation;Poor frustration tolerance      Sensation   Light Touch  Appears Intact    Proprioception  --   Pt. unable to complete     Coordination   Right 9 Hole Peg Test  39 sec.    Left 9 Hole Peg Test  46 sec.      Strength   Overall Strength Comments  BUE strength 5/5 overall      Hand Function   Right Hand Grip (lbs)  76#    Right Hand Lateral Pinch  23 lbs    Right Hand 3 Point Pinch  19 lbs    Left Hand Grip (lbs)  85#    Left Hand Lateral Pinch  23 lbs    Left 3 point pinch  17 lbs                      OT Education - 12/15/19 1141    Education Details  OT services, Plan    Person(s) Educated  Patient;Spouse    Comprehension  Verbalized understanding;Returned demonstration                 Plan - 12/15/19 1142    Clinical Impression Statement  Pt. is a 74 y.o. male who was admitted to [facility] on 12/06/2019 with a CVA. Pt. was discharged on 12/09/2019. Pt. is HOH, and presents with aphasia. Pt. was referred to outpatient OT services following hospitalization. The pt. and his wife report that he completing ADLs, and self-care tasks independently, and shower transfers with modified independence.The pt. has returned to mowing the yard independently. Pt.'s BUE strength, and [place] skills have improved, and are now WNL. OT skilled services are not warranted at this time. No OT treatment plan or goals are indicated. Pt. and his wife are in agreement. Pt. could benefit from an STservices, and a request for a referral has been made.    OT Occupational Profile and History  Detailed Assessment- Review of Records and additional review of physical, cognitive, psychosocial history related to current functional performance    Occupational performance deficits (Please refer to evaluation for details):  ADL's    Body Structure / Function / Physical Skills  ADL;UE functional use;Strength;FMC    Rehab Potential  Good    Clinical  Decision Making  Several treatment options, min-mod task modification necessary    Comorbidities Affecting Occupational Performance:  May have comorbidities impacting occupational performance    Modification or Assistance to Complete Evaluation   Min-Moderate modification of tasks or assist with assess necessary to complete eval    Consulted and Agree with Plan of Care  Patient;Family member/caregiver    Family Member Consulted  Wife       Patient will benefit from skilled therapeutic intervention in order to improve the following deficits and impairments:   Body Structure / Function / Physical Skills: ADL, UE functional use, Strength, [place]       Visit Diagnosis: Muscle weakness (generalized)  Other lack of coordination    Problem List Patient Active Problem List   Diagnosis Date Noted  . Stroke ([place]) 12/07/2019  . CVA (cerebral vascular accident) ([place]) 12/06/2019  . Gait abnormality 11/12/2019  . Tremor, essential 11/12/2019  . Health care maintenance 10/04/2018  . Thumb pain 10/04/2018  . Memory change 12/17/2017  . Paresthesia 12/03/2017  . Lightheaded 08/28/2017  . Encounter for chronic pain management 06/18/2017  .  Neck pain 05/14/2017  . Panic 01/10/2017  . Dysuria 03/29/2016  . Muscle ache 10/05/2015  . Syncope 07/08/2015  . Chronic back pain 04/12/2015  . Medicare annual wellness visit, initial 03/08/2015  . Advance care planning 03/08/2015  . Dysgeusia 11/28/2014  . Anxiety state 10/27/2014  . Diabetic polyneuropathy ([place]) 02/14/2014  . Rhinitis, chronic 01/13/2012  . Basal cell carcinoma   . LUMBAR RADICULOPATHY 10/16/2010  . HLD (hyperlipidemia) 05/13/2008  . Abdominal pain 08/11/2007  . Uncontrolled type 2 diabetes mellitus with peripheral neuropathy ([place]) 05/26/2007  . Essential hypertension 05/26/2007  . SCOLIOSIS 05/26/2007  . Tremor 05/26/2007  . COLONIC POLYPS, HX OF 05/26/2007    Harrel Carina, MS, OTR/L 12/15/2019, 1:23 PM  [place] MAIN [facility] SERVICES [address] [place], [place], [address] Phone: [phone]   Fax:  [phone]  Name: RAIYAN Harris MRN: [id number] Date of Birth: [birth year]

## 2019-12-15 NOTE — Therapy (Signed)
[place] MAIN [facility] SERVICES [address] [place], [place], [address] Phone: [phone]   Fax:  [phone]  Patient Details  Name: Walter Harris MRN: [id number] Date of Birth: 01-24-1946 Referring Provider:  [name], MD  Encounter Date: 12/15/2019  Patient and patient's wife refuse Physical Therapy Evaluation. State patient does not need PT, only needs/wants Speech Therapy. Not receptive to education or evaluation for physical therapy. I will be happy to see this patient as needed in the future if patient changes his mind.    Janna Arch, PT, DPT   12/15/2019, 11:00 AM  [place] MAIN [facility] SERVICES [address] [place], [place], [address] Phone: [phone]   Fax:  [phone]

## 2019-12-17 ENCOUNTER — Ambulatory Visit: Payer: PPO | Admitting: Family Medicine

## 2019-12-17 ENCOUNTER — Telehealth: Payer: Self-pay | Admitting: Family Medicine

## 2019-12-17 DIAGNOSIS — Z09 Encounter for follow-up examination after completed treatment for conditions other than malignant neoplasm: Secondary | ICD-10-CM | POA: Diagnosis not present

## 2019-12-17 DIAGNOSIS — E1169 Type 2 diabetes mellitus with other specified complication: Secondary | ICD-10-CM | POA: Diagnosis not present

## 2019-12-17 DIAGNOSIS — I10 Essential (primary) hypertension: Secondary | ICD-10-CM | POA: Diagnosis not present

## 2019-12-17 DIAGNOSIS — I639 Cerebral infarction, unspecified: Secondary | ICD-10-CM | POA: Diagnosis not present

## 2019-12-17 DIAGNOSIS — Z794 Long term (current) use of insulin: Secondary | ICD-10-CM | POA: Diagnosis not present

## 2019-12-17 DIAGNOSIS — E1165 Type 2 diabetes mellitus with hyperglycemia: Secondary | ICD-10-CM | POA: Diagnosis not present

## 2019-12-17 DIAGNOSIS — E785 Hyperlipidemia, unspecified: Secondary | ICD-10-CM | POA: Diagnosis not present

## 2019-12-17 NOTE — Telephone Encounter (Signed)
Electronic refill request. Alprazolam Last office visit:   10/05/2019 Last Filled:   09/17/2019   #60   1 RF Please advise.

## 2019-12-19 NOTE — Telephone Encounter (Signed)
Sent. Thanks.   

## 2019-12-20 ENCOUNTER — Other Ambulatory Visit: Payer: Self-pay | Admitting: Family Medicine

## 2019-12-20 ENCOUNTER — Encounter: Payer: PPO | Admitting: Occupational Therapy

## 2019-12-20 NOTE — Telephone Encounter (Deleted)
Electronic refill request. Alprazolam Last office visit:   10/05/2019 Last Filled:    60 tablet 1 12/19/2019

## 2019-12-21 ENCOUNTER — Other Ambulatory Visit: Payer: Self-pay | Admitting: *Deleted

## 2019-12-21 MED ORDER — OXYCODONE-ACETAMINOPHEN 5-325 MG PO TABS: ORAL_TABLET | ORAL | 0 refills | Status: DC

## 2019-12-21 NOTE — Telephone Encounter (Signed)
Name of Medication: Oxycodone Name of Pharmacy: [facility] Last Fill or Written Date and Quantity:  180 tablet 0 11/17/2019  Last Office Visit and Type: 10/05/2019 Next Office Visit and Type: None Last Controlled Substance Agreement Date: 02/28/2015 Last UDS:  03/14/2016

## 2019-12-21 NOTE — Telephone Encounter (Signed)
Patient's wife called about refill request. She stated that the pharmacy has not received the refill request for the [facility] And that the patient also needed the refill for OXYCODONE.   Patient's wife was very upset that this prescriptions have not been received. She stated the patient had a stoke and needs his medication   Please advise

## 2019-12-21 NOTE — Telephone Encounter (Signed)
I spoke with the patient's wife and informed her that we had not previously had a request for the Oxycodone and that the Xanax has already been sent to the pharmacy.  Wife is very emotional and says she is now responsible for all of this and she is doing the best she can but it is overwhelming.

## 2019-12-21 NOTE — Telephone Encounter (Signed)
Sent. Thanks.   

## 2019-12-21 NOTE — Telephone Encounter (Signed)
She has a lot on her and I am sympathetic.  I sent xanax rx and oxycodone rx.  Please let me know if the pharmacy doesn't get them.  Thanks.

## 2019-12-22 DIAGNOSIS — C44329 Squamous cell carcinoma of skin of other parts of face: Secondary | ICD-10-CM | POA: Diagnosis not present

## 2019-12-23 ENCOUNTER — Ambulatory Visit: Payer: PPO

## 2019-12-28 ENCOUNTER — Other Ambulatory Visit: Payer: Self-pay | Admitting: Family Medicine

## 2019-12-28 ENCOUNTER — Encounter: Payer: PPO | Admitting: Occupational Therapy

## 2019-12-28 ENCOUNTER — Ambulatory Visit: Payer: PPO

## 2019-12-31 ENCOUNTER — Ambulatory Visit: Payer: PPO

## 2019-12-31 ENCOUNTER — Encounter: Payer: PPO | Admitting: Occupational Therapy

## 2020-01-03 ENCOUNTER — Telehealth: Payer: Self-pay | Admitting: Family Medicine

## 2020-01-03 MED ORDER — AMLODIPINE BESYLATE 5 MG PO TABS: 5.0000 mg | ORAL_TABLET | Freq: Every day | ORAL | 1 refills | Status: DC

## 2020-01-03 NOTE — Telephone Encounter (Signed)
I've sent in 1 month supply of amlodipine to his pharmacy.

## 2020-01-03 NOTE — Telephone Encounter (Signed)
Seen in [facility] for a stroke 12/06/19 Given a Rx for Amlodipine 5mg  upon d/c  Pt is needing a refill of this - only has a few tablets left  Pharmacy : [facility]   Please advise, thanks.  Aware that Dr [name] is not available - will send to Dr Darnell Level to address.

## 2020-01-03 NOTE — Telephone Encounter (Signed)
Thanks

## 2020-01-03 NOTE — Telephone Encounter (Signed)
Spoke with pt's wife, [name] (on dpr), notifying her rx was sent to pharmacy.  She will notify pt.

## 2020-01-04 ENCOUNTER — Other Ambulatory Visit: Payer: Self-pay

## 2020-01-04 ENCOUNTER — Encounter: Payer: PPO | Admitting: Occupational Therapy

## 2020-01-04 ENCOUNTER — Ambulatory Visit: Payer: PPO

## 2020-01-04 ENCOUNTER — Encounter: Payer: Self-pay | Admitting: Speech Pathology

## 2020-01-04 ENCOUNTER — Ambulatory Visit: Payer: PPO | Admitting: Speech Pathology

## 2020-01-04 DIAGNOSIS — M6281 Muscle weakness (generalized): Secondary | ICD-10-CM | POA: Diagnosis not present

## 2020-01-04 DIAGNOSIS — R4701 Aphasia: Secondary | ICD-10-CM

## 2020-01-04 NOTE — Therapy (Signed)
[place] MAIN [facility] SERVICES [address] [place], [place], [address] Phone: [phone]   Fax:  [phone]  Speech Language Pathology Evaluation  Patient Details  Name: Walter Harris MRN: [id number] Date of Birth: 12/29/1945 Referring Provider (SLP): Elsie Stain   Encounter Date: 01/04/2020  End of Session - 01/04/20 1443    Visit Number  1    Number of Visits  25    Date for SLP Re-Evaluation  03/28/20    Authorization Type  [employer]    Authorization Time Period  01/04/2020 - 03/28/2020    Authorization - Visit Number  1    Authorization - Number of Visits  25    Progress Note Due on Visit  10    SLP Start Time  1300    SLP Stop Time   1400    SLP Time Calculation (min)  60 min    Activity Tolerance  Patient tolerated treatment well       Past Medical History:  Diagnosis Date  . Basal cell carcinoma    multiple  . Cataracts, both eyes 2019   surgery pending April 2020  . Complication of anesthesia    post-operative cognitive dysfunction after ACDF on 05/01/11 [facility])  . Deaf    right ear  . Diabetes mellitus   . DJD (degenerative joint disease)    lumbar spine  . Gait abnormality 11/12/2019  . Hearing impaired    hearing aids-transmitter rt   . History of blood transfusion    as a baby in 89  . History of colonic polyps   . Hyperlipidemia   . Hypertension    pt. denies high blood pressure  . Kidney stone 1967  . Memory change 12/17/2017  . Nose fracture    3x  . OCD (obsessive compulsive disorder)   . Stroke [facility])    "mini stroke, that's what gave me the tremors"  . Stuttering   . Tremor, essential   . Vertigo    random  . Wears dentures    full upper  . Wears glasses     Past Surgical History:  Procedure Laterality Date  . APPENDECTOMY  1960  . BACK SURGERY  2012   lumbar surgery  . CATARACT EXTRACTION W/PHACO Right 04/12/2019   Procedure: CATARACT EXTRACTION PHACO AND INTRAOCULAR LENS  PLACEMENT (IOC) RIGHT, DIABETIC;  Surgeon: [name], MD;  Location: [place];  Service: Ophthalmology;  Laterality: Right;  Diabetic - insulin and oral meds  . CATARACT EXTRACTION W/PHACO Left 05/10/2019   Procedure: CATARACT EXTRACTION PHACO AND INTRAOCULAR LENS PLACEMENT (IOC)  LEFT DIABETIC  00:25.6  14.4%  3.75;  Surgeon: [name], MD;  Location: [place];  Service: Ophthalmology;  Laterality: Left;  Diabetic - insulin and oral meds  . CERVICAL FUSION  1990  . CERVICAL FUSION  8/12   multiple levels with plates  . CHOLESTEATOMA EXCISION  1993  . COLLATERAL LIGAMENT REPAIR, ELBOW     bilateral, nerve improvement left 08/04, right 09/04  . COLONOSCOPY    . CYST EXCISION     back of cervical spine total of 5 surgeries  . EXTERNAL EAR SURGERY     multiple ear surguries as a child  . OTHER SURGICAL HISTORY     benign head tumor#5  . OTHER SURGICAL HISTORY     sebaceous cysts-post neck x5  . SHOULDER ARTHROSCOPY W/ ACROMIAL REPAIR  08/2004   left shoulder  .  TONSILLECTOMY    . ULNAR NERVE TRANSPOSITION Right 05/13/2013   Procedure: RIGHT ULNAR NERVE DECOMPRESSION/TRANSPOSITION;  Surgeon: [name]., MD;  Location: [place];  Service: Orthopedics;  Laterality: Right;    There were no vitals filed for this visit.  Subjective Assessment - 01/04/20 1441    Subjective  Pt frustrated by his aphasia    Patient is accompained by:  Family member    Currently in Pain?  No/denies         SLP Evaluation [facility] - 01/04/20 1426      SLP Visit Information   SLP Received On  01/04/20    Referring Provider (SLP)  Elsie Stain    Onset Date  12/15/2019      Subjective   Subjective  pt with vast emotional fluctuates     Patient/Family Stated Goal  to remember things better      Pain Assessment   Currently in Pain?  No/denies      General Information   HPI  Pt is a 74 y.o. male with a known history of type diabetes mellitus,  hypertension, dyslipidemia and moderate to severe hearing impairment who experienced an acute infarct within the anterior left MCA territory and was hospitalized 12/06/19 to 12/09/19.         Prior Functional Status   Cognitive/Linguistic Baseline  Within functional limits    Type of Home  House     Lives With  Spouse    Available Support  Family      Cognition   Overall Cognitive Status  Difficult to assess    Difficult to assess due to  Hard of hearing/deaf;Impaired communication    Behaviors  Verbal agitation;Poor frustration tolerance      Auditory Comprehension   Overall Auditory Comprehension  Appears within functional limits for tasks assessed    Yes/No Questions  Impaired    Basic Biographical Questions  26-50% accurate    Basic Immediate Environment Questions  25-49% accurate    Complex Questions  Not tested    Commands  Within Functional Limits    Conversation  Simple    Interfering Components  Hearing    EffectiveTechniques  Repetition      Visual Recognition/Discrimination   Discrimination  Within Function Limits      Reading Comprehension   Reading Status  Within funtional limits      Expression   Primary Mode of Expression  Verbal      Verbal Expression   Overall Verbal Expression  Impaired    Initiation  No impairment    Automatic Speech  Name;Social Response    Level of Generative/Spontaneous Verbalization  Sentence    Repetition  No impairment    Naming  Impairment    Responsive  0-25% accurate    Confrontation  0-24% accurate    Convergent  75-100% accurate    Divergent  0-24% accurate    Verbal Errors  Perseveration;Aware of errors;Inconsistent    Pragmatics  No impairment    Effective Techniques  Sentence completion    Non-Verbal Means of Communication  Not applicable      Written Expression   Dominant Hand  Right    Written Expression  Unable to assess (comment)   d/t tremors     Oral Motor/Sensory Function   Overall Oral Motor/Sensory Function   Appears within functional limits for tasks assessed      Motor Speech   Overall Motor Speech  Impaired  Respiration  Within functional limits    Phonation  Normal    Resonance  Within functional limits    Articulation  Within functional limitis    Intelligibility  Intelligibility reduced    Motor Planning  Witnin functional limits    Motor Speech Errors  Aware;Inconsistent   perseverative phonemes   Interfering Components  Hearing loss    Phonation  [facility]                      SLP Education - 01/04/20 1441    Education Details  Education provided on how to help pt not give up when expressing himself    Person(s) Educated  Patient;Spouse    Methods  Explanation;Demonstration    Comprehension  Verbalized understanding         SLP Long Term Goals - 01/04/20 1608      SLP LONG TERM GOAL #1   Title  Pt will utilize word retrieval strategies to communicate sentence level simple linguistic tasks with 75% accuracy.    Baseline  25%    Time  12    Period  Weeks    Status  New    Target Date  03/28/20      SLP LONG TERM GOAL #2   Title  Pt will answer basic yes/no questions with 75% accuracy and Minimal cues.    Baseline  25%    Time  12    Period  Weeks    Status  New    Target Date  03/28/20       Plan - 01/04/20 1446    Clinical Impression Statement  Pt presents with moderate anomic aphasia that is further complicated by perseverative motor production of phonemics making his speech intelligibility very disfluent. Pt has word finding deficits across all naming tasks. His accuracy is increased with provided with object description. Pt's speech is trimerous and frequently has preserverative motor patterns although I don't suspect that pt has apraxia of speech. He does have islands of fluent speech when talking about extensively known topics. Pt's receptive language abilities (able to follow 2 step directions) and reading ability (at the sentence level) appear  intact. Pt's ability to answer yes/no questions varies with context and subject. Additionally, pt has increased frustration with his aphasia and poor task tolerance. Pt's speech language deficits  have a very negative impact on pt's independence and ability to communicate his wants and needs. As such, skilled ST is required to target the above mentioned deficits to increase pt's functional independence and reduce caregiver burden.    Speech Therapy Frequency  2x / week    Duration  --   12 weeks   Treatment/Interventions  Patient/family education;Language facilitation    Potential to Achieve Goals  Good    SLP Home Exercise Plan  decrease level of frustration    Consulted and Agree with Plan of Care  Patient;Family member/caregiver    Family Member Consulted  wife       Patient will benefit from skilled therapeutic intervention in order to improve the following deficits and impairments:   Aphasia    Problem List Patient Active Problem List   Diagnosis Date Noted  . Stroke ([place]) 12/07/2019  . CVA (cerebral vascular accident) ([place]) 12/06/2019  . Gait abnormality 11/12/2019  . Tremor, essential 11/12/2019  . Health care maintenance 10/04/2018  . Thumb pain 10/04/2018  . Memory change 12/17/2017  . Paresthesia 12/03/2017  . Lightheaded 08/28/2017  . Encounter for  chronic pain management 06/18/2017  . Neck pain 05/14/2017  . Panic 01/10/2017  . Dysuria 03/29/2016  . Muscle ache 10/05/2015  . Syncope 07/08/2015  . Chronic back pain 04/12/2015  . Medicare annual wellness visit, initial 03/08/2015  . Advance care planning 03/08/2015  . Dysgeusia 11/28/2014  . Anxiety state 10/27/2014  . Diabetic polyneuropathy ([place]) 02/14/2014  . Rhinitis, chronic 01/13/2012  . Basal cell carcinoma   . LUMBAR RADICULOPATHY 10/16/2010  . HLD (hyperlipidemia) 05/13/2008  . Abdominal pain 08/11/2007  . Uncontrolled type 2 diabetes mellitus with peripheral neuropathy ([place]) 05/26/2007  . Essential  hypertension 05/26/2007  . SCOLIOSIS 05/26/2007  . Tremor 05/26/2007  . COLONIC POLYPS, HX OF 05/26/2007    Yuktha Kerchner B. Rutherford Nail M.S., CCC-SLP, [place] Pathologist Rehabilitation Services Office [place] MAIN [facility] SERVICES [address] [place], [place], [address] Phone: [phone]   Fax:  [phone]  Name: Walter Harris MRN: [id number] Date of Birth: Mar 09, 1946

## 2020-01-06 ENCOUNTER — Ambulatory Visit: Payer: PPO

## 2020-01-06 NOTE — Progress Notes (Signed)
  Physician Documentation  Your signature is required to indicate approval of the treatment plan as stated above. By signing this report, you are approving the plan of care. Please sign and either send electronically or print and fax the signed copy to the number below. If you approve with modifications, please indicate those in the space provided. Physician Signature: Elsie Stain _ Date:_04/29/21 __ Time:_8:34 AM_

## 2020-01-10 ENCOUNTER — Ambulatory Visit: Payer: PPO

## 2020-01-10 ENCOUNTER — Encounter: Payer: PPO | Admitting: Occupational Therapy

## 2020-01-11 ENCOUNTER — Other Ambulatory Visit: Payer: Self-pay

## 2020-01-11 ENCOUNTER — Ambulatory Visit: Payer: PPO | Attending: Family Medicine | Admitting: Speech Pathology

## 2020-01-11 DIAGNOSIS — R4701 Aphasia: Secondary | ICD-10-CM | POA: Diagnosis not present

## 2020-01-12 ENCOUNTER — Ambulatory Visit: Payer: PPO | Admitting: Physical Therapy

## 2020-01-12 ENCOUNTER — Encounter: Payer: Self-pay | Admitting: Speech Pathology

## 2020-01-12 ENCOUNTER — Ambulatory Visit: Payer: PPO

## 2020-01-12 NOTE — Therapy (Signed)
[place] MAIN [facility] SERVICES [address] [place], [place], [address] Phone: [phone]   Fax:  [phone]  Speech Language Pathology Treatment  Patient Details  Name: Walter Harris MRN: [id number] Date of Birth: 27-Sep-1945 Referring Provider (SLP): Elsie Stain   Encounter Date: 01/11/2020  End of Session - 01/12/20 1708    Visit Number  2    Number of Visits  25    Date for SLP Re-Evaluation  03/28/20    Authorization Type  [employer]    Authorization Time Period  01/04/2020 - 03/28/2020    Authorization - Visit Number  2    Authorization - Number of Visits  25    Progress Note Due on Visit  10    SLP Start Time  1000    SLP Stop Time   1153    SLP Time Calculation (min)  113 min    Activity Tolerance  Patient tolerated treatment well       Past Medical History:  Diagnosis Date  . Basal cell carcinoma    multiple  . Cataracts, both eyes 2019   surgery pending April 2020  . Complication of anesthesia    post-operative cognitive dysfunction after ACDF on 05/01/11 [facility])  . Deaf    right ear  . Diabetes mellitus   . DJD (degenerative joint disease)    lumbar spine  . Gait abnormality 11/12/2019  . Hearing impaired    hearing aids-transmitter rt   . History of blood transfusion    as a baby in 77  . History of colonic polyps   . Hyperlipidemia   . Hypertension    pt. denies high blood pressure  . Kidney stone 1967  . Memory change 12/17/2017  . Nose fracture    3x  . OCD (obsessive compulsive disorder)   . Stroke [facility])    "mini stroke, that's what gave me the tremors"  . Stuttering   . Tremor, essential   . Vertigo    random  . Wears dentures    full upper  . Wears glasses     Past Surgical History:  Procedure Laterality Date  . APPENDECTOMY  1960  . BACK SURGERY  2012   lumbar surgery  . CATARACT EXTRACTION W/PHACO Right 04/12/2019   Procedure: CATARACT EXTRACTION PHACO AND INTRAOCULAR LENS  PLACEMENT (IOC) RIGHT, DIABETIC;  Surgeon: [name], MD;  Location: [place];  Service: Ophthalmology;  Laterality: Right;  Diabetic - insulin and oral meds  . CATARACT EXTRACTION W/PHACO Left 05/10/2019   Procedure: CATARACT EXTRACTION PHACO AND INTRAOCULAR LENS PLACEMENT (IOC)  LEFT DIABETIC  00:25.6  14.4%  3.75;  Surgeon: [name], MD;  Location: [place];  Service: Ophthalmology;  Laterality: Left;  Diabetic - insulin and oral meds  . CERVICAL FUSION  1990  . CERVICAL FUSION  8/12   multiple levels with plates  . CHOLESTEATOMA EXCISION  1993  . COLLATERAL LIGAMENT REPAIR, ELBOW     bilateral, nerve improvement left 08/04, right 09/04  . COLONOSCOPY    . CYST EXCISION     back of cervical spine total of 5 surgeries  . EXTERNAL EAR SURGERY     multiple ear surguries as a child  . OTHER SURGICAL HISTORY     benign head tumor#5  . OTHER SURGICAL HISTORY     sebaceous cysts-post neck x5  . SHOULDER ARTHROSCOPY W/ ACROMIAL REPAIR  08/2004   left shoulder  .  TONSILLECTOMY    . ULNAR NERVE TRANSPOSITION Right 05/13/2013   Procedure: RIGHT ULNAR NERVE DECOMPRESSION/TRANSPOSITION;  Surgeon: [name]., MD;  Location: [place];  Service: Orthopedics;  Laterality: Right;    There were no vitals filed for this visit.  Subjective Assessment - 01/12/20 1707    Subjective  Pt frustrated by his aphasia    Patient is accompained by:  Family member    Currently in Pain?  No/denies           ADULT SLP TREATMENT - 01/12/20 0001      General Information   Behavior/Cognition  Alert;Cooperative;Pleasant mood    Patient Positioning  Upright in chair    Oral care provided  N/A    HPI  Pt is a 74 y.o. 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          Treatment Provided   Treatment provided  Cognitive-Linquistic      Pain Assessment   Pain Assessment  No/denies pain      Cognitive-Linquistic Treatment   Treatment focused on  Aphasia    Skilled Treatment  Skilled treatment session targeted pt's communication goals. SLP facilitated session by providing Maximal assistance to generate phrase related to function of pictured object. Pt also required Maximal assistance to read generated sentences. He was able to produce sentence during imitation exercises but unable to produce without model. However, when talking generally about objects pt noted to produce sentences of greater length during spontaneous speech. After the above structured tasks, pt and his wife voiced lack of understanding of the word "aphasia." Pt and his wife didn't have any knowledge of neurophysiology of pt's stroke and the resulting communication deficits. Pt felt that he "couldn't remember things" such as using the TV remote. In actuality, pt isn't able to read the words on the remote. An additional 60 minutes were spent in education on location of pt's CVA and aphasia. Education was provided to pt and his wife on deficits observed with Broca's aphasia and many questions were answered to their satisfaction. Pt to bring in TV remote and phone headset to next session for more functional carryover of strategies. Strategy also provided on use of printed yes/no that pt can point to in order to communicate his desires and decreased communication breakdown.       Assessment / Recommendations / Plan   Plan  Continue with current plan of care      Progression Toward Goals   Progression toward goals  Progressing toward goals       SLP Education - 01/12/20 1708    Education Details  extensive education provided on aphasia    Person(s) Educated  Patient;Spouse    Methods  Explanation;Demonstration;Handout    Comprehension  Need further instruction         SLP Long Term  Goals - 01/04/20 1608      SLP LONG TERM GOAL #1   Title  Pt will utilize word retrieval strategies to communicate sentence level simple linguistic tasks with 75% accuracy.    Baseline  25%    Time  12    Period  Weeks    Status  New    Target Date  03/28/20      SLP LONG TERM GOAL #2   Title  Pt will answer basic  yes/no questions with 75% accuracy and Minimal cues.    Baseline  25%    Time  12    Period  Weeks    Status  New    Target Date  03/28/20       Plan - 01/12/20 1709    Clinical Impression Statement  Pt made good progress during his first treatment session    Speech Therapy Frequency  2x / week    Duration  --   12 weeks   Treatment/Interventions  Patient/family education;Language facilitation    Potential to Achieve Goals  Good    SLP Home Exercise Plan  read sentences, review information on aphasia    Consulted and Agree with Plan of Care  Patient;Family member/caregiver    Family Member Consulted  wife       Patient will benefit from skilled therapeutic intervention in order to improve the following deficits and impairments:   Aphasia    Problem List Patient Active Problem List   Diagnosis Date Noted  . Stroke ([place]) 12/07/2019  . CVA (cerebral vascular accident) ([place]) 12/06/2019  . Gait abnormality 11/12/2019  . Tremor, essential 11/12/2019  . Health care maintenance 10/04/2018  . Thumb pain 10/04/2018  . Memory change 12/17/2017  . Paresthesia 12/03/2017  . Lightheaded 08/28/2017  . Encounter for chronic pain management 06/18/2017  . Neck pain 05/14/2017  . Panic 01/10/2017  . Dysuria 03/29/2016  . Muscle ache 10/05/2015  . Syncope 07/08/2015  . Chronic back pain 04/12/2015  . Medicare annual wellness visit, initial 03/08/2015  . Advance care planning 03/08/2015  . Dysgeusia 11/28/2014  . Anxiety state 10/27/2014  . Diabetic polyneuropathy ([place]) 02/14/2014  . Rhinitis, chronic 01/13/2012  . Basal cell carcinoma   . LUMBAR RADICULOPATHY  10/16/2010  . HLD (hyperlipidemia) 05/13/2008  . Abdominal pain 08/11/2007  . Uncontrolled type 2 diabetes mellitus with peripheral neuropathy ([place]) 05/26/2007  . Essential hypertension 05/26/2007  . SCOLIOSIS 05/26/2007  . Tremor 05/26/2007  . COLONIC POLYPS, HX OF 05/26/2007   Georgianna Band B. Rutherford Nail M.S., CCC-SLP, [facility] Speech-Language Pathologist Rehabilitation Services Office [phone]  Stormy Fabian 01/12/2020, 5:14 PM  [place] MAIN [facility] SERVICES [address] [place], [place], [address] Phone: [phone]   Fax:  [phone]   Name: JANES STALLWORTH MRN: [id number] Date of Birth: 1945-12-25

## 2020-01-13 ENCOUNTER — Ambulatory Visit: Payer: PPO | Admitting: Speech Pathology

## 2020-01-13 ENCOUNTER — Other Ambulatory Visit: Payer: Self-pay

## 2020-01-13 ENCOUNTER — Encounter: Payer: Self-pay | Admitting: Speech Pathology

## 2020-01-13 DIAGNOSIS — R4701 Aphasia: Secondary | ICD-10-CM | POA: Diagnosis not present

## 2020-01-13 NOTE — Therapy (Signed)
[place] MAIN [facility] SERVICES [address] [place], [place], [address] Phone: [phone]   Fax:  [phone]  Speech Language Pathology Treatment  Patient Details  Name: Walter Harris MRN: [id number] Date of Birth: 12-20-1945 Referring Provider (SLP): Elsie Stain   Encounter Date: 01/13/2020  End of Session - 01/13/20 1320    Visit Number  3    Number of Visits  25    Date for SLP Re-Evaluation  03/28/20    Authorization Type  [employer]    Authorization Time Period  01/04/2020 - 03/28/2020    Authorization - Visit Number  3    Authorization - Number of Visits  25    Progress Note Due on Visit  10    SLP Start Time  1100    SLP Stop Time   1200    SLP Time Calculation (min)  60 min    Activity Tolerance  Patient tolerated treatment well       Past Medical History:  Diagnosis Date  . Basal cell carcinoma    multiple  . Cataracts, both eyes 2019   surgery pending April 2020  . Complication of anesthesia    post-operative cognitive dysfunction after ACDF on 05/01/11 [facility])  . Deaf    right ear  . Diabetes mellitus   . DJD (degenerative joint disease)    lumbar spine  . Gait abnormality 11/12/2019  . Hearing impaired    hearing aids-transmitter rt   . History of blood transfusion    as a baby in 73  . History of colonic polyps   . Hyperlipidemia   . Hypertension    pt. denies high blood pressure  . Kidney stone 1967  . Memory change 12/17/2017  . Nose fracture    3x  . OCD (obsessive compulsive disorder)   . Stroke [facility])    "mini stroke, that's what gave me the tremors"  . Stuttering   . Tremor, essential   . Vertigo    random  . Wears dentures    full upper  . Wears glasses     Past Surgical History:  Procedure Laterality Date  . APPENDECTOMY  1960  . BACK SURGERY  2012   lumbar surgery  . CATARACT EXTRACTION W/PHACO Right 04/12/2019   Procedure: CATARACT EXTRACTION PHACO AND INTRAOCULAR LENS  PLACEMENT (IOC) RIGHT, DIABETIC;  Surgeon: [name], MD;  Location: [place];  Service: Ophthalmology;  Laterality: Right;  Diabetic - insulin and oral meds  . CATARACT EXTRACTION W/PHACO Left 05/10/2019   Procedure: CATARACT EXTRACTION PHACO AND INTRAOCULAR LENS PLACEMENT (IOC)  LEFT DIABETIC  00:25.6  14.4%  3.75;  Surgeon: [name], MD;  Location: [place];  Service: Ophthalmology;  Laterality: Left;  Diabetic - insulin and oral meds  . CERVICAL FUSION  1990  . CERVICAL FUSION  8/12   multiple levels with plates  . CHOLESTEATOMA EXCISION  1993  . COLLATERAL LIGAMENT REPAIR, ELBOW     bilateral, nerve improvement left 08/04, right 09/04  . COLONOSCOPY    . CYST EXCISION     back of cervical spine total of 5 surgeries  . EXTERNAL EAR SURGERY     multiple ear surguries as a child  . OTHER SURGICAL HISTORY     benign head tumor#5  . OTHER SURGICAL HISTORY     sebaceous cysts-post neck x5  . SHOULDER ARTHROSCOPY W/ ACROMIAL REPAIR  08/2004   left shoulder  .  TONSILLECTOMY    . ULNAR NERVE TRANSPOSITION Right 05/13/2013   Procedure: RIGHT ULNAR NERVE DECOMPRESSION/TRANSPOSITION;  Surgeon: [name]., MD;  Location: [place];  Service: Orthopedics;  Laterality: Right;    There were no vitals filed for this visit.  Subjective Assessment - 01/13/20 1319    Subjective  Pt with less task tolerance during this session.    Patient is accompained by:  Family member    Currently in Pain?  No/denies            ADULT SLP TREATMENT - 01/13/20 1313      General Information   Behavior/Cognition  Alert;Cooperative;Pleasant mood;Agitated    Patient Positioning  Upright in chair    Oral care provided  N/A    HPI  Pt is a 74 y.o. 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         Treatment Provided   Treatment provided  Cognitive-Linquistic      Pain Assessment   Pain Assessment  No/denies pain      Cognitive-Linquistic Treatment   Treatment focused on  Aphasia    Skilled Treatment  Skilled treatment session targeted pt's communicative abilities. SLP facilitated session by having pt being in his phone and TV remote. Per their report, pt has difficulty using the features on the phone such as playing messages. SLP took pictures of both and printed the pictures. With printed directions and buttons highlighted on the picture, pt required Maximal assistance to locate buttons by function. Pt's task tolerance was exceptionally low today and he required extensive redirection to task and to reduce frustration. Therapeutic activity limited to only phone feature that plays messages.       Assessment / Recommendations / Plan   Plan  Continue with current plan of care      Progression Toward Goals   Progression toward goals  Progressing toward goals       SLP Education - 01/13/20 1319    Education Details  extensive educaiton on limiting frustration in order to increase progress towards more independent communication    Person(s) Educated  Patient;Spouse    Methods  Explanation;Demonstration;Verbal cues    Comprehension  Need further instruction         SLP Long Term Goals - 01/04/20 [place] #1   Title  Pt will utilize word retrieval strategies to communicate sentence level simple linguistic tasks with 75% accuracy.    Baseline  25%    Time  12    Period  Weeks    Status  New    Target Date  03/28/20      SLP LONG TERM GOAL #2   Title  Pt will answer basic yes/no questions with 75% accuracy and Minimal cues.    Baseline  25%    Time  12    Period  Weeks    Status  New    Target Date  03/28/20       Plan - 01/13/20 1324    Clinical Impression Statement  Pt continues to be eagerly participate in  each session. He has a great desire to regain functional independence with his language abilities.    Speech Therapy Frequency  2x / week    Duration  --   12  weeks   Treatment/Interventions  Patient/family education;Language facilitation    Potential to Achieve Goals  Good    SLP Home Exercise Plan  practice saying sentence and salient words for playing messages on the phone    Consulted and Agree with Plan of Care  Patient;Family member/caregiver    Family Member Consulted  wife       Patient will benefit from skilled therapeutic intervention in order to improve the following deficits and impairments:   Aphasia    Problem List Patient Active Problem List   Diagnosis Date Noted  . Stroke ([place]) 12/07/2019  . CVA (cerebral vascular accident) ([place]) 12/06/2019  . Gait abnormality 11/12/2019  . Tremor, essential 11/12/2019  . Health care maintenance 10/04/2018  . Thumb pain 10/04/2018  . Memory change 12/17/2017  . Paresthesia 12/03/2017  . Lightheaded 08/28/2017  . Encounter for chronic pain management 06/18/2017  . Neck pain 05/14/2017  . Panic 01/10/2017  . Dysuria 03/29/2016  . Muscle ache 10/05/2015  . Syncope 07/08/2015  . Chronic back pain 04/12/2015  . Medicare annual wellness visit, initial 03/08/2015  . Advance care planning 03/08/2015  . Dysgeusia 11/28/2014  . Anxiety state 10/27/2014  . Diabetic polyneuropathy ([place]) 02/14/2014  . Rhinitis, chronic 01/13/2012  . Basal cell carcinoma   . LUMBAR RADICULOPATHY 10/16/2010  . HLD (hyperlipidemia) 05/13/2008  . Abdominal pain 08/11/2007  . Uncontrolled type 2 diabetes mellitus with peripheral neuropathy ([place]) 05/26/2007  . Essential hypertension 05/26/2007  . SCOLIOSIS 05/26/2007  . Tremor 05/26/2007  . COLONIC POLYPS, HX OF 05/26/2007    Lerlene Treadwell B. Rutherford Nail M.S., CCC-SLP, [place] Pathologist Rehabilitation Services Office [phone]   Stormy Fabian 01/13/2020, 1:26 PM  [place] MAIN [facility] SERVICES [address] [place], [place], [address] Phone: [phone]   Fax:  [phone]   Name: Walter Harris MRN: [id number] Date of Birth: 08-06-1946

## 2020-01-17 ENCOUNTER — Other Ambulatory Visit: Payer: Self-pay

## 2020-01-17 ENCOUNTER — Ambulatory Visit: Payer: PPO | Admitting: Speech Pathology

## 2020-01-17 ENCOUNTER — Encounter: Payer: PPO | Admitting: Occupational Therapy

## 2020-01-17 ENCOUNTER — Encounter: Payer: Self-pay | Admitting: Speech Pathology

## 2020-01-17 ENCOUNTER — Ambulatory Visit: Payer: PPO | Admitting: Physical Therapy

## 2020-01-17 DIAGNOSIS — R4701 Aphasia: Secondary | ICD-10-CM | POA: Diagnosis not present

## 2020-01-17 NOTE — Therapy (Signed)
[place] MAIN [facility] SERVICES [address] [place], [place], [address] Phone: [phone]   Fax:  [phone]  Speech Language Pathology Treatment  Patient Details  Name: Walter Harris MRN: [id number] Date of Birth: 11/17/1945 Referring Provider (SLP): Elsie Stain   Encounter Date: 01/17/2020  End of Session - 01/17/20 1553    Visit Number  4    Number of Visits  25    Date for SLP Re-Evaluation  03/28/20    Authorization Type  [employer]    Authorization Time Period  01/04/2020 - 03/28/2020    Authorization - Visit Number  4    Authorization - Number of Visits  25    Progress Note Due on Visit  10    SLP Start Time  1400    SLP Stop Time   1500    SLP Time Calculation (min)  60 min    Activity Tolerance  Patient tolerated treatment well       Past Medical History:  Diagnosis Date  . Basal cell carcinoma    multiple  . Cataracts, both eyes 2019   surgery pending April 2020  . Complication of anesthesia    post-operative cognitive dysfunction after ACDF on 05/01/11 [facility])  . Deaf    right ear  . Diabetes mellitus   . DJD (degenerative joint disease)    lumbar spine  . Gait abnormality 11/12/2019  . Hearing impaired    hearing aids-transmitter rt   . History of blood transfusion    as a baby in 43  . History of colonic polyps   . Hyperlipidemia   . Hypertension    pt. denies high blood pressure  . Kidney stone 1967  . Memory change 12/17/2017  . Nose fracture    3x  . OCD (obsessive compulsive disorder)   . Stroke [facility])    "mini stroke, that's what gave me the tremors"  . Stuttering   . Tremor, essential   . Vertigo    random  . Wears dentures    full upper  . Wears glasses     Past Surgical History:  Procedure Laterality Date  . APPENDECTOMY  1960  . BACK SURGERY  2012   lumbar surgery  . CATARACT EXTRACTION W/PHACO Right 04/12/2019   Procedure: CATARACT EXTRACTION PHACO AND INTRAOCULAR LENS  PLACEMENT (IOC) RIGHT, DIABETIC;  Surgeon: [name], MD;  Location: [place];  Service: Ophthalmology;  Laterality: Right;  Diabetic - insulin and oral meds  . CATARACT EXTRACTION W/PHACO Left 05/10/2019   Procedure: CATARACT EXTRACTION PHACO AND INTRAOCULAR LENS PLACEMENT (IOC)  LEFT DIABETIC  00:25.6  14.4%  3.75;  Surgeon: [name], MD;  Location: [place];  Service: Ophthalmology;  Laterality: Left;  Diabetic - insulin and oral meds  . CERVICAL FUSION  1990  . CERVICAL FUSION  8/12   multiple levels with plates  . CHOLESTEATOMA EXCISION  1993  . COLLATERAL LIGAMENT REPAIR, ELBOW     bilateral, nerve improvement left 08/04, right 09/04  . COLONOSCOPY    . CYST EXCISION     back of cervical spine total of 5 surgeries  . EXTERNAL EAR SURGERY     multiple ear surguries as a child  . OTHER SURGICAL HISTORY     benign head tumor#5  . OTHER SURGICAL HISTORY     sebaceous cysts-post neck x5  . SHOULDER ARTHROSCOPY W/ ACROMIAL REPAIR  08/2004   left shoulder  .  TONSILLECTOMY    . ULNAR NERVE TRANSPOSITION Right 05/13/2013   Procedure: RIGHT ULNAR NERVE DECOMPRESSION/TRANSPOSITION;  Surgeon: [name]., MD;  Location: [place];  Service: Orthopedics;  Laterality: Right;    There were no vitals filed for this visit.  Subjective Assessment - 01/17/20 1552    Subjective  Pt pleasant, very eager and engaged    Patient is accompained by:  Family member    Currently in Pain?  No/denies            ADULT SLP TREATMENT - 01/17/20 1545      General Information   Behavior/Cognition  Alert;Cooperative;Pleasant mood;Agitated    Patient Positioning  Upright in chair    Oral care provided  N/A    HPI  Pt is a 74 y.o. 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         Treatment Provided   Treatment provided  Cognitive-Linquistic      Pain Assessment   Pain Assessment  No/denies pain      Cognitive-Linquistic Treatment   Treatment focused on  Aphasia    Skilled Treatment  Skilled treatment session targeted pt's expressive language goals. SLP facilitated session by providing written, visual and verbal cues to increase language use when using phone feature to play messages. Pt able to immediately imitate and repeat written cues. However, after a delay, pt required maximal cues to implement cues for correct language use.       Assessment / Recommendations / Plan   Plan  Continue with current plan of care      Progression Toward Goals   Progression toward goals  Progressing toward goals       SLP Education - 01/17/20 1552    Education Details  extensive education provided on nature of aphasia and function/feature of objects    Person(s) Educated  Patient;Spouse    Methods  Explanation;Demonstration;Verbal cues    Comprehension  Need further instruction         SLP Long Term Goals - 01/04/20 [place] #1   Title  Pt will utilize word retrieval strategies to communicate sentence level simple linguistic tasks with 75% accuracy.    Baseline  25%    Time  12    Period  Weeks    Status  New    Target Date  03/28/20      SLP LONG TERM GOAL #2   Title  Pt will answer basic yes/no questions with 75% accuracy and Minimal cues.    Baseline  25%    Time  12    Period  Weeks    Status  New    Target Date  03/28/20       Plan - 01/17/20 1553    Clinical Impression Statement  Pt was very eager to participate and he inhibited his frustration to remain on task for majority of the session. Additionally, he and his wife report increased accuracy with yes/no responses as well as increased ability to access his computer. Therapy concepts have carried over into some daily activities.    Speech Therapy  Frequency  2x / week    Duration  --   12   Treatment/Interventions  Patient/family education;Language facilitation    Potential to Achieve Goals  Good  SLP Home Exercise Plan  practice saying sentence and salient words for playing messages on the phone    Consulted and Agree with Plan of Care  Patient;Family member/caregiver    Family Member Consulted  wife       Patient will benefit from skilled therapeutic intervention in order to improve the following deficits and impairments:   Aphasia    Problem List Patient Active Problem List   Diagnosis Date Noted  . Stroke ([place]) 12/07/2019  . CVA (cerebral vascular accident) ([place]) 12/06/2019  . Gait abnormality 11/12/2019  . Tremor, essential 11/12/2019  . Health care maintenance 10/04/2018  . Thumb pain 10/04/2018  . Memory change 12/17/2017  . Paresthesia 12/03/2017  . Lightheaded 08/28/2017  . Encounter for chronic pain management 06/18/2017  . Neck pain 05/14/2017  . Panic 01/10/2017  . Dysuria 03/29/2016  . Muscle ache 10/05/2015  . Syncope 07/08/2015  . Chronic back pain 04/12/2015  . Medicare annual wellness visit, initial 03/08/2015  . Advance care planning 03/08/2015  . Dysgeusia 11/28/2014  . Anxiety state 10/27/2014  . Diabetic polyneuropathy ([place]) 02/14/2014  . Rhinitis, chronic 01/13/2012  . Basal cell carcinoma   . LUMBAR RADICULOPATHY 10/16/2010  . HLD (hyperlipidemia) 05/13/2008  . Abdominal pain 08/11/2007  . Uncontrolled type 2 diabetes mellitus with peripheral neuropathy ([place]) 05/26/2007  . Essential hypertension 05/26/2007  . SCOLIOSIS 05/26/2007  . Tremor 05/26/2007  . COLONIC POLYPS, HX OF 05/26/2007   Rachna Schonberger B. Rutherford Nail M.S., CCC-SLP, [place] Pathologist Rehabilitation Services Office [phone]  Stormy Fabian 01/17/2020, 4:01 PM  [place] MAIN [facility] SERVICES [address] [place], [place], [address] Phone: [phone]   Fax:   [phone]   Name: ARNAB SOLURI MRN: [id number] Date of Birth: 1946-01-28

## 2020-01-19 ENCOUNTER — Encounter: Payer: PPO | Admitting: Occupational Therapy

## 2020-01-19 ENCOUNTER — Encounter: Payer: Self-pay | Admitting: Speech Pathology

## 2020-01-19 ENCOUNTER — Ambulatory Visit: Payer: PPO | Admitting: Physical Therapy

## 2020-01-19 ENCOUNTER — Ambulatory Visit: Payer: PPO | Admitting: Speech Pathology

## 2020-01-19 ENCOUNTER — Other Ambulatory Visit: Payer: Self-pay

## 2020-01-19 DIAGNOSIS — R4701 Aphasia: Secondary | ICD-10-CM | POA: Diagnosis not present

## 2020-01-19 NOTE — Therapy (Signed)
[place] MAIN [facility] SERVICES [address] [place], [place], [address] Phone: [phone]   Fax:  [phone]  Speech Language Pathology Treatment  Patient Details  Name: Walter Harris MRN: [id number] Date of Birth: June 25, 1946 Referring Provider (SLP): Elsie Stain   Encounter Date: 01/19/2020  End of Session - 01/19/20 1541    Visit Number  5    Number of Visits  25    Date for SLP Re-Evaluation  03/28/20    Authorization Type  [employer]    Authorization Time Period  01/04/2020 - 03/28/2020    Authorization - Visit Number  5    Authorization - Number of Visits  25    Progress Note Due on Visit  10    SLP Start Time  1400    SLP Stop Time   1500    SLP Time Calculation (min)  60 min    Activity Tolerance  Patient tolerated treatment well       Past Medical History:  Diagnosis Date  . Basal cell carcinoma    multiple  . Cataracts, both eyes 2019   surgery pending April 2020  . Complication of anesthesia    post-operative cognitive dysfunction after ACDF on 05/01/11 [facility])  . Deaf    right ear  . Diabetes mellitus   . DJD (degenerative joint disease)    lumbar spine  . Gait abnormality 11/12/2019  . Hearing impaired    hearing aids-transmitter rt   . History of blood transfusion    as a baby in 23  . History of colonic polyps   . Hyperlipidemia   . Hypertension    pt. denies high blood pressure  . Kidney stone 1967  . Memory change 12/17/2017  . Nose fracture    3x  . OCD (obsessive compulsive disorder)   . Stroke [facility])    "mini stroke, that's what gave me the tremors"  . Stuttering   . Tremor, essential   . Vertigo    random  . Wears dentures    full upper  . Wears glasses     Past Surgical History:  Procedure Laterality Date  . APPENDECTOMY  1960  . BACK SURGERY  2012   lumbar surgery  . CATARACT EXTRACTION W/PHACO Right 04/12/2019   Procedure: CATARACT EXTRACTION PHACO AND INTRAOCULAR LENS  PLACEMENT (IOC) RIGHT, DIABETIC;  Surgeon: [name], MD;  Location: [place];  Service: Ophthalmology;  Laterality: Right;  Diabetic - insulin and oral meds  . CATARACT EXTRACTION W/PHACO Left 05/10/2019   Procedure: CATARACT EXTRACTION PHACO AND INTRAOCULAR LENS PLACEMENT (IOC)  LEFT DIABETIC  00:25.6  14.4%  3.75;  Surgeon: [name], MD;  Location: [place];  Service: Ophthalmology;  Laterality: Left;  Diabetic - insulin and oral meds  . CERVICAL FUSION  1990  . CERVICAL FUSION  8/12   multiple levels with plates  . CHOLESTEATOMA EXCISION  1993  . COLLATERAL LIGAMENT REPAIR, ELBOW     bilateral, nerve improvement left 08/04, right 09/04  . COLONOSCOPY    . CYST EXCISION     back of cervical spine total of 5 surgeries  . EXTERNAL EAR SURGERY     multiple ear surguries as a child  . OTHER SURGICAL HISTORY     benign head tumor#5  . OTHER SURGICAL HISTORY     sebaceous cysts-post neck x5  . SHOULDER ARTHROSCOPY W/ ACROMIAL REPAIR  08/2004   left shoulder  .  TONSILLECTOMY    . ULNAR NERVE TRANSPOSITION Right 05/13/2013   Procedure: RIGHT ULNAR NERVE DECOMPRESSION/TRANSPOSITION;  Surgeon: [name]., MD;  Location: [place];  Service: Orthopedics;  Laterality: Right;    There were no vitals filed for this visit.  Subjective Assessment - 01/19/20 1540    Subjective  Pt pleasant, very eager and engaged    Patient is accompained by:  Family member    Currently in Pain?  No/denies            ADULT SLP TREATMENT - 01/19/20 0001      General Information   Behavior/Cognition  Alert;Cooperative;Pleasant mood;Agitated    Patient Positioning  Upright in chair    Oral care provided  N/A    HPI  Pt is a 74 y.o. 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         Treatment Provided   Treatment provided  Cognitive-Linquistic      Pain Assessment   Pain Assessment  No/denies pain      Cognitive-Linquistic Treatment   Treatment focused on  Aphasia    Skilled Treatment  Skilled treatment session targeted pt's expressive language goals. SLP facilitated session by providing minimal cues to complete matching identification tasks when matching word to line drawing. Pt also able to complete matching printed function to line drawing of object and to printed object word with moderate faded to minimal cues. Finally, pt with increase ability to use target word in simple basic sentence. Pt and wife pleased with pt's progress and ability demonstrated within this session.        Assessment / Recommendations / Plan   Plan  Continue with current plan of care      Progression Toward Goals   Progression toward goals  Progressing toward goals       SLP Education - 01/19/20 1540    Education Details  education provided on rationale behind tasks targeted within this session's activities    Person(s) Educated  Patient;Spouse    Methods  Explanation;Demonstration;Verbal cues;Handout    Comprehension  Verbalized understanding         SLP Long Term Goals - 01/04/20 1608      SLP LONG TERM GOAL #1   Title  Pt will utilize word retrieval strategies to communicate sentence level simple linguistic tasks with 75% accuracy.    Baseline  25%    Time  12    Period  Weeks    Status  New    Target Date  03/28/20      SLP LONG TERM GOAL #2   Title  Pt will answer basic yes/no questions with 75% accuracy and Minimal cues.    Baseline  25%    Time  12    Period  Weeks    Status  New    Target Date  03/28/20       Plan - 01/19/20 1541    Clinical Impression Statement  Pt continues to be very motivated to participate in language therapy. Pt struggled less with structured language tasks and demonstrated increased language use as a result.  Skilled ST continues to be required to increase pt's functional communication and to decrease caregiver burden.    Speech Therapy Frequency  2x / week    Duration  --   12  weeks   Treatment/Interventions  Patient/family education;Language facilitation    Potential to Achieve Goals  Good    SLP Home Exercise Plan  complete worksheets on matching and identification    Consulted and Agree with Plan of Care  Patient;Family member/caregiver    Family Member Consulted  wife       Patient will benefit from skilled therapeutic intervention in order to improve the following deficits and impairments:   Aphasia    Problem List Patient Active Problem List   Diagnosis Date Noted  . Stroke ([place]) 12/07/2019  . CVA (cerebral vascular accident) ([place]) 12/06/2019  . Gait abnormality 11/12/2019  . Tremor, essential 11/12/2019  . Health care maintenance 10/04/2018  . Thumb pain 10/04/2018  . Memory change 12/17/2017  . Paresthesia 12/03/2017  . Lightheaded 08/28/2017  . Encounter for chronic pain management 06/18/2017  . Neck pain 05/14/2017  . Panic 01/10/2017  . Dysuria 03/29/2016  . Muscle ache 10/05/2015  . Syncope 07/08/2015  . Chronic back pain 04/12/2015  . Medicare annual wellness visit, initial 03/08/2015  . Advance care planning 03/08/2015  . Dysgeusia 11/28/2014  . Anxiety state 10/27/2014  . Diabetic polyneuropathy ([place]) 02/14/2014  . Rhinitis, chronic 01/13/2012  . Basal cell carcinoma   . LUMBAR RADICULOPATHY 10/16/2010  . HLD (hyperlipidemia) 05/13/2008  . Abdominal pain 08/11/2007  . Uncontrolled type 2 diabetes mellitus with peripheral neuropathy ([place]) 05/26/2007  . Essential hypertension 05/26/2007  . SCOLIOSIS 05/26/2007  . Tremor 05/26/2007  . COLONIC POLYPS, HX OF 05/26/2007   Chrissie Dacquisto B. Rutherford Nail M.S., CCC-SLP, [place] Pathologist Rehabilitation Services Office [phone]  Stormy Fabian 01/19/2020, 3:43 PM  [place] MAIN [facility] SERVICES [address] [place], [place], [address] Phone: [phone]   Fax:  [phone]   Name: Walter Harris MRN: [id number] Date of Birth: 26-Mar-1946

## 2020-01-24 ENCOUNTER — Encounter: Payer: PPO | Admitting: Occupational Therapy

## 2020-01-24 ENCOUNTER — Ambulatory Visit: Payer: PPO | Admitting: Physical Therapy

## 2020-01-26 ENCOUNTER — Encounter: Payer: PPO | Admitting: Occupational Therapy

## 2020-01-26 ENCOUNTER — Ambulatory Visit: Payer: PPO | Admitting: Physical Therapy

## 2020-01-27 ENCOUNTER — Ambulatory Visit: Payer: PPO | Admitting: Speech Pathology

## 2020-01-27 ENCOUNTER — Other Ambulatory Visit: Payer: Self-pay | Admitting: Family Medicine

## 2020-01-27 ENCOUNTER — Other Ambulatory Visit: Payer: Self-pay

## 2020-01-27 DIAGNOSIS — R4701 Aphasia: Secondary | ICD-10-CM

## 2020-01-27 DIAGNOSIS — E785 Hyperlipidemia, unspecified: Secondary | ICD-10-CM | POA: Diagnosis not present

## 2020-01-27 DIAGNOSIS — Z794 Long term (current) use of insulin: Secondary | ICD-10-CM | POA: Diagnosis not present

## 2020-01-27 DIAGNOSIS — E1169 Type 2 diabetes mellitus with other specified complication: Secondary | ICD-10-CM | POA: Diagnosis not present

## 2020-01-27 DIAGNOSIS — E1165 Type 2 diabetes mellitus with hyperglycemia: Secondary | ICD-10-CM | POA: Diagnosis not present

## 2020-01-27 MED ORDER — OXYCODONE-ACETAMINOPHEN 5-325 MG PO TABS: ORAL_TABLET | ORAL | 0 refills | Status: DC

## 2020-01-27 NOTE — Telephone Encounter (Signed)
Patient needs a refill on Oxycodone sent to [facility].  Patient has 5 or 6 pills left.  Aware Dr.Duncan is out of the office and it may be Monday before it's refilled.

## 2020-01-27 NOTE — Telephone Encounter (Signed)
Name of Medication:oxycodone apap 5-325 mg  Name of Pharmacy: [facility] Last Fill or Written Date and Quantity:# 180 on 12/21/19  Last Office Visit and Type: 10/05/19 annual Next Office Visit and Type: none scheduled Last Controlled Substance Agreement Date: 02/28/2015 Last UDS:03/14/2016

## 2020-01-27 NOTE — Telephone Encounter (Signed)
Sent. Thanks.   

## 2020-01-28 ENCOUNTER — Encounter: Payer: Self-pay | Admitting: Speech Pathology

## 2020-01-28 NOTE — Therapy (Signed)
[place] MAIN [facility] SERVICES [address] [place], [place], [address] Phone: [phone]   Fax:  [phone]  Speech Language Pathology Treatment  Patient Details  Name: Walter Harris MRN: [id number] Date of Birth: November 18, 1945 Referring Provider (SLP): Elsie Stain   Encounter Date: 01/27/2020  End of Session - 01/28/20 1447    Visit Number  6    Number of Visits  25    Date for SLP Re-Evaluation  03/28/20    Authorization Type  [employer]    Authorization Time Period  01/04/2020 - 03/28/2020    Authorization - Visit Number  6    Authorization - Number of Visits  25    Progress Note Due on Visit  10    SLP Start Time  1300    SLP Stop Time   1400    SLP Time Calculation (min)  60 min    Activity Tolerance  Patient tolerated treatment well       Past Medical History:  Diagnosis Date  . Basal cell carcinoma    multiple  . Cataracts, both eyes 2019   surgery pending April 2020  . Complication of anesthesia    post-operative cognitive dysfunction after ACDF on 05/01/11 [facility])  . Deaf    right ear  . Diabetes mellitus   . DJD (degenerative joint disease)    lumbar spine  . Gait abnormality 11/12/2019  . Hearing impaired    hearing aids-transmitter rt   . History of blood transfusion    as a baby in 61  . History of colonic polyps   . Hyperlipidemia   . Hypertension    pt. denies high blood pressure  . Kidney stone 1967  . Memory change 12/17/2017  . Nose fracture    3x  . OCD (obsessive compulsive disorder)   . Stroke [facility])    "mini stroke, that's what gave me the tremors"  . Stuttering   . Tremor, essential   . Vertigo    random  . Wears dentures    full upper  . Wears glasses     Past Surgical History:  Procedure Laterality Date  . APPENDECTOMY  1960  . BACK SURGERY  2012   lumbar surgery  . CATARACT EXTRACTION W/PHACO Right 04/12/2019   Procedure: CATARACT EXTRACTION PHACO AND INTRAOCULAR LENS  PLACEMENT (IOC) RIGHT, DIABETIC;  Surgeon: [name], MD;  Location: [place];  Service: Ophthalmology;  Laterality: Right;  Diabetic - insulin and oral meds  . CATARACT EXTRACTION W/PHACO Left 05/10/2019   Procedure: CATARACT EXTRACTION PHACO AND INTRAOCULAR LENS PLACEMENT (IOC)  LEFT DIABETIC  00:25.6  14.4%  3.75;  Surgeon: [name], MD;  Location: [place];  Service: Ophthalmology;  Laterality: Left;  Diabetic - insulin and oral meds  . CERVICAL FUSION  1990  . CERVICAL FUSION  8/12   multiple levels with plates  . CHOLESTEATOMA EXCISION  1993  . COLLATERAL LIGAMENT REPAIR, ELBOW     bilateral, nerve improvement left 08/04, right 09/04  . COLONOSCOPY    . CYST EXCISION     back of cervical spine total of 5 surgeries  . EXTERNAL EAR SURGERY     multiple ear surguries as a child  . OTHER SURGICAL HISTORY     benign head tumor#5  . OTHER SURGICAL HISTORY     sebaceous cysts-post neck x5  . SHOULDER ARTHROSCOPY W/ ACROMIAL REPAIR  08/2004   left shoulder  .  TONSILLECTOMY    . ULNAR NERVE TRANSPOSITION Right 05/13/2013   Procedure: RIGHT ULNAR NERVE DECOMPRESSION/TRANSPOSITION;  Surgeon: [name]., MD;  Location: [place];  Service: Orthopedics;  Laterality: Right;    There were no vitals filed for this visit.  Subjective Assessment - 01/28/20 1119    Subjective  Pt pleasant, calm and demonstrates greater understanding of aphasia    Patient is accompained by:  Family member    Currently in Pain?  No/denies       ADULT SLP TREATMENT - 01/28/20 0001      General Information   Behavior/Cognition  Alert;Cooperative;Pleasant mood    Patient Positioning  Upright in chair    Oral care provided  N/A    HPI  Pt is a 74 y.o. 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         Treatment Provided   Treatment provided  Cognitive-Linquistic      Pain Assessment   Pain Assessment  No/denies pain      Cognitive-Linquistic Treatment   Treatment focused on  Aphasia    Skilled Treatment  Skilled treatment session targeted pt's expressive language goals. SLP facilitated session by providing supervision level cues for generating basic familiar opposites. Pt required maximal assistance for more abstract opposites, such as cloudy/sunny. SLP facilitated understanding of contrast between the two abstract opposites by generating list of semantic features for each. Pt continued to struggle with comprehension of concept. Pt's spontaneous expression was noted to be improved within the session.         Assessment / Recommendations / Plan   Plan  Continue with current plan of care      Progression Toward Goals   Progression toward goals  Progressing toward goals       SLP Education - 01/28/20 1446    Education Details  education provided on rationale behind tasks targeted within session's activities    Person(s) Educated  Patient;Spouse    Methods  Explanation;Demonstration;Verbal cues;Handout    Comprehension  Need further instruction         SLP Long Term Goals - 01/04/20 1608      SLP LONG TERM GOAL #1   Title  Pt will utilize word retrieval strategies to communicate sentence level simple linguistic tasks with 75% accuracy.    Baseline  25%    Time  12    Period  Weeks    Status  New    Target Date  03/28/20      SLP LONG TERM GOAL #2   Title  Pt will answer basic yes/no questions with 75% accuracy and Minimal cues.    Baseline  25%    Time  12    Period  Weeks    Status  New    Target Date  03/28/20       Plan - 01/28/20 1448    Clinical Impression Statement  Pt is demonstrating improved problem solving within naming tasks that increase the accuracy of his responses. Additionally, he requires less redirection to  task, and he exhibits decreased frustration. Reading continues to be a strength as well as methodically working thru structured language tasks. His wife reports improved spontaneous conversation that now includes several turn taking opportunities and complete sentences by pt.    Speech Therapy Frequency  2x /  week    Duration  --   12 weeks   Treatment/Interventions  Patient/family education;Language facilitation    Potential to Achieve Goals  Good    SLP Home Exercise Plan  complete worksheets on matching and identification    Consulted and Agree with Plan of Care  Patient;Family member/caregiver    Family Member Consulted  wife       Patient will benefit from skilled therapeutic intervention in order to improve the following deficits and impairments:   Aphasia    Problem List Patient Active Problem List   Diagnosis Date Noted  . Stroke ([place]) 12/07/2019  . CVA (cerebral vascular accident) ([place]) 12/06/2019  . Gait abnormality 11/12/2019  . Tremor, essential 11/12/2019  . Health care maintenance 10/04/2018  . Thumb pain 10/04/2018  . Memory change 12/17/2017  . Paresthesia 12/03/2017  . Lightheaded 08/28/2017  . Encounter for chronic pain management 06/18/2017  . Neck pain 05/14/2017  . Panic 01/10/2017  . Dysuria 03/29/2016  . Muscle ache 10/05/2015  . Syncope 07/08/2015  . Chronic back pain 04/12/2015  . Medicare annual wellness visit, initial 03/08/2015  . Advance care planning 03/08/2015  . Dysgeusia 11/28/2014  . Anxiety state 10/27/2014  . Diabetic polyneuropathy ([place]) 02/14/2014  . Rhinitis, chronic 01/13/2012  . Basal cell carcinoma   . LUMBAR RADICULOPATHY 10/16/2010  . HLD (hyperlipidemia) 05/13/2008  . Abdominal pain 08/11/2007  . Uncontrolled type 2 diabetes mellitus with peripheral neuropathy ([place]) 05/26/2007  . Essential hypertension 05/26/2007  . SCOLIOSIS 05/26/2007  . Tremor 05/26/2007  . COLONIC POLYPS, HX OF 05/26/2007   Suheily Birks B. Rutherford Nail M.S.,  CCC-SLP, [place] Pathologist Rehabilitation Services Office [phone]   Stormy Fabian 01/28/2020, 2:51 PM  [place] MAIN [facility] SERVICES [address] [place], [place], [address] Phone: [phone]   Fax:  [phone]   Name: Walter Harris MRN: [id number] Date of Birth: Feb 12, 1946

## 2020-01-29 ENCOUNTER — Other Ambulatory Visit: Payer: Self-pay | Admitting: Family Medicine

## 2020-01-31 ENCOUNTER — Encounter: Payer: PPO | Admitting: Occupational Therapy

## 2020-01-31 ENCOUNTER — Ambulatory Visit: Payer: PPO | Admitting: Speech Pathology

## 2020-01-31 ENCOUNTER — Ambulatory Visit: Payer: PPO | Admitting: Physical Therapy

## 2020-01-31 ENCOUNTER — Other Ambulatory Visit: Payer: Self-pay

## 2020-01-31 DIAGNOSIS — R4701 Aphasia: Secondary | ICD-10-CM

## 2020-01-31 NOTE — Telephone Encounter (Signed)
Refill request tizanidine Last refill 01/12/19 #120/3 Last office visit 10/05/19

## 2020-02-01 ENCOUNTER — Encounter: Payer: Self-pay | Admitting: Speech Pathology

## 2020-02-01 NOTE — Therapy (Signed)
[place] MAIN [facility] SERVICES [address] [place], [place], [address] Phone: [phone]   Fax:  [phone]  Speech Language Pathology Treatment  Patient Details  Name: Walter Harris MRN: [id number] Date of Birth: 05/18/1946 Referring Provider (SLP): Elsie Stain   Encounter Date: 01/31/2020  End of Session - 02/01/20 1044    Visit Number  7    Number of Visits  25    Date for SLP Re-Evaluation  03/28/20    Authorization Type  [employer]    Authorization Time Period  01/04/2020 - 03/28/2020    Authorization - Visit Number  7    Authorization - Number of Visits  25    Progress Note Due on Visit  10    SLP Start Time  1300    SLP Stop Time   1400    SLP Time Calculation (min)  60 min    Activity Tolerance  Patient tolerated treatment well       Past Medical History:  Diagnosis Date  . Basal cell carcinoma    multiple  . Cataracts, both eyes 2019   surgery pending April 2020  . Complication of anesthesia    post-operative cognitive dysfunction after ACDF on 05/01/11 [facility])  . Deaf    right ear  . Diabetes mellitus   . DJD (degenerative joint disease)    lumbar spine  . Gait abnormality 11/12/2019  . Hearing impaired    hearing aids-transmitter rt   . History of blood transfusion    as a baby in 89  . History of colonic polyps   . Hyperlipidemia   . Hypertension    pt. denies high blood pressure  . Kidney stone 1967  . Memory change 12/17/2017  . Nose fracture    3x  . OCD (obsessive compulsive disorder)   . Stroke [facility])    "mini stroke, that's what gave me the tremors"  . Stuttering   . Tremor, essential   . Vertigo    random  . Wears dentures    full upper  . Wears glasses     Past Surgical History:  Procedure Laterality Date  . APPENDECTOMY  1960  . BACK SURGERY  2012   lumbar surgery  . CATARACT EXTRACTION W/PHACO Right 04/12/2019   Procedure: CATARACT EXTRACTION PHACO AND INTRAOCULAR LENS  PLACEMENT (IOC) RIGHT, DIABETIC;  Surgeon: [name], MD;  Location: [place];  Service: Ophthalmology;  Laterality: Right;  Diabetic - insulin and oral meds  . CATARACT EXTRACTION W/PHACO Left 05/10/2019   Procedure: CATARACT EXTRACTION PHACO AND INTRAOCULAR LENS PLACEMENT (IOC)  LEFT DIABETIC  00:25.6  14.4%  3.75;  Surgeon: [name], MD;  Location: [place];  Service: Ophthalmology;  Laterality: Left;  Diabetic - insulin and oral meds  . CERVICAL FUSION  1990  . CERVICAL FUSION  8/12   multiple levels with plates  . CHOLESTEATOMA EXCISION  1993  . COLLATERAL LIGAMENT REPAIR, ELBOW     bilateral, nerve improvement left 08/04, right 09/04  . COLONOSCOPY    . CYST EXCISION     back of cervical spine total of 5 surgeries  . EXTERNAL EAR SURGERY     multiple ear surguries as a child  . OTHER SURGICAL HISTORY     benign head tumor#5  . OTHER SURGICAL HISTORY     sebaceous cysts-post neck x5  . SHOULDER ARTHROSCOPY W/ ACROMIAL REPAIR  08/2004   left shoulder  .  TONSILLECTOMY    . ULNAR NERVE TRANSPOSITION Right 05/13/2013   Procedure: RIGHT ULNAR NERVE DECOMPRESSION/TRANSPOSITION;  Surgeon: [name]., MD;  Location: [place];  Service: Orthopedics;  Laterality: Right;    There were no vitals filed for this visit.  Subjective Assessment - 02/01/20 1042    Subjective  Pt pleasant, calm and demonstrates greater understanding of aphasia    Patient is accompained by:  Family member    Currently in Pain?  No/denies      ADULT SLP TREATMENT - 02/01/20 1043      General Information   Behavior/Cognition  Alert;Cooperative;Pleasant mood    HPI  Pt is a 74 y.o. 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         Treatment Provided    Treatment provided  Cognitive-Linquistic      Pain Assessment   Pain Assessment  No/denies pain      Cognitive-Linquistic Treatment   Treatment focused on  Aphasia    Skilled Treatment  Skilled treatment session targeted pt's expressive communication goals. SLP facilitated session by semantic category of opposites. Pt was able to independently read and select appropriate basic familiar opposite from choice of 3 words. Pt required maximal assistance with abstract words/concepts. With verbal cues, pt able to generate functional sentence targeting the oppositional abstract categories. Targeted words were underlined for increased emphasis.        Progression Toward Goals   Progression toward goals  Progressing toward goals       SLP Education - 02/01/20 1044    Education Details  education provided on rationale of targeted skills    Person(s) Educated  Patient;Spouse    Methods  Explanation;Demonstration;Verbal cues    Comprehension  Need further instruction         SLP Long Term Goals - 01/04/20 1608      SLP LONG TERM GOAL #1   Title  Pt will utilize word retrieval strategies to communicate sentence level simple linguistic tasks with 75% accuracy.    Baseline  25%    Time  12    Period  Weeks    Status  New    Target Date  03/28/20      SLP LONG TERM GOAL #2   Title  Pt will answer basic yes/no questions with 75% accuracy and Minimal cues.    Baseline  25%    Time  12    Period  Weeks    Status  New    Target Date  03/28/20       Plan - 02/01/20 1045    Clinical Impression Statement  Pt continues to demonstrate improved word finding skills. As a result, pt's speech has increased fluency and pt is producing increased sentences that are free of word finding deficits.  His wife reports that pt was able to log on to their computer and has spoken via phone to his sister with increased fluency. During session, pt continues to demonstrate increased task tolerance and improved  problem solving within tasks.    Speech Therapy Frequency  2x / week    Duration  --   12 weeks   Treatment/Interventions  Patient/family education;Language facilitation    Potential to Achieve Goals  Good    SLP Home Exercise Plan  practice activities targeting opposites  Consulted and Agree with Plan of Care  Patient;Family member/caregiver    Family Member Consulted  wife       Patient will benefit from skilled therapeutic intervention in order to improve the following deficits and impairments:   Aphasia    Problem List Patient Active Problem List   Diagnosis Date Noted  . Stroke ([place]) 12/07/2019  . CVA (cerebral vascular accident) ([place]) 12/06/2019  . Gait abnormality 11/12/2019  . Tremor, essential 11/12/2019  . Health care maintenance 10/04/2018  . Thumb pain 10/04/2018  . Memory change 12/17/2017  . Paresthesia 12/03/2017  . Lightheaded 08/28/2017  . Encounter for chronic pain management 06/18/2017  . Neck pain 05/14/2017  . Panic 01/10/2017  . Dysuria 03/29/2016  . Muscle ache 10/05/2015  . Syncope 07/08/2015  . Chronic back pain 04/12/2015  . Medicare annual wellness visit, initial 03/08/2015  . Advance care planning 03/08/2015  . Dysgeusia 11/28/2014  . Anxiety state 10/27/2014  . Diabetic polyneuropathy ([place]) 02/14/2014  . Rhinitis, chronic 01/13/2012  . Basal cell carcinoma   . LUMBAR RADICULOPATHY 10/16/2010  . HLD (hyperlipidemia) 05/13/2008  . Abdominal pain 08/11/2007  . Uncontrolled type 2 diabetes mellitus with peripheral neuropathy ([place]) 05/26/2007  . Essential hypertension 05/26/2007  . SCOLIOSIS 05/26/2007  . Tremor 05/26/2007  . COLONIC POLYPS, HX OF 05/26/2007   Emmabelle Fear B. Rutherford Nail M.S., CCC-SLP, [place] Pathologist Rehabilitation Services Office [phone]  Stormy Fabian 02/01/2020, 10:47 AM  [place] MAIN [facility] SERVICES [address] [place], [place], [address] Phone:  [phone]   Fax:  [phone]   Name: Walter Harris MRN: [id number] Date of Birth: [birth year]

## 2020-02-01 NOTE — Telephone Encounter (Signed)
Sent. Thanks.   

## 2020-02-02 ENCOUNTER — Encounter: Payer: PPO | Admitting: Occupational Therapy

## 2020-02-02 ENCOUNTER — Ambulatory Visit: Payer: PPO | Admitting: Physical Therapy

## 2020-02-03 ENCOUNTER — Ambulatory Visit: Payer: PPO | Admitting: Speech Pathology

## 2020-02-03 ENCOUNTER — Other Ambulatory Visit: Payer: Self-pay

## 2020-02-03 DIAGNOSIS — R4701 Aphasia: Secondary | ICD-10-CM

## 2020-02-04 ENCOUNTER — Encounter: Payer: Self-pay | Admitting: Speech Pathology

## 2020-02-04 NOTE — Therapy (Signed)
[place] MAIN [facility] SERVICES [address] [place], [place], [address] Phone: [phone]   Fax:  [phone]  Speech Language Pathology Treatment  Patient Details  Name: Walter Harris MRN: [id number] Date of Birth: [birth year] Referring Provider (SLP): Elsie Stain   Encounter Date: 02/03/2020  End of Session - 02/04/20 1124    Visit Number  8    Number of Visits  25    Date for SLP Re-Evaluation  03/28/20    Authorization Type  [employer]    Authorization Time Period  01/04/2020 - 03/28/2020    Authorization - Visit Number  8    Authorization - Number of Visits  25    Progress Note Due on Visit  10    SLP Start Time  1300    SLP Stop Time   1400    SLP Time Calculation (min)  60 min    Activity Tolerance  Patient tolerated treatment well       Past Medical History:  Diagnosis Date  . Basal cell carcinoma    multiple  . Cataracts, both eyes 2019   surgery pending April 2020  . Complication of anesthesia    post-operative cognitive dysfunction after ACDF on 05/01/11 [facility])  . Deaf    right ear  . Diabetes mellitus   . DJD (degenerative joint disease)    lumbar spine  . Gait abnormality 11/12/2019  . Hearing impaired    hearing aids-transmitter rt   . History of blood transfusion    as a baby in 47  . History of colonic polyps   . Hyperlipidemia   . Hypertension    pt. denies high blood pressure  . Kidney stone 1967  . Memory change 12/17/2017  . Nose fracture    3x  . OCD (obsessive compulsive disorder)   . Stroke [facility])    "mini stroke, that's what gave me the tremors"  . Stuttering   . Tremor, essential   . Vertigo    random  . Wears dentures    full upper  . Wears glasses     Past Surgical History:  Procedure Laterality Date  . APPENDECTOMY  1960  . BACK SURGERY  2012   lumbar surgery  . CATARACT EXTRACTION W/PHACO Right 04/12/2019   Procedure: CATARACT EXTRACTION PHACO AND INTRAOCULAR LENS  PLACEMENT (IOC) RIGHT, DIABETIC;  Surgeon: [name], MD;  Location: [place];  Service: Ophthalmology;  Laterality: Right;  Diabetic - insulin and oral meds  . CATARACT EXTRACTION W/PHACO Left 05/10/2019   Procedure: CATARACT EXTRACTION PHACO AND INTRAOCULAR LENS PLACEMENT (IOC)  LEFT DIABETIC  00:25.6  14.4%  3.75;  Surgeon: [name], MD;  Location: [place];  Service: Ophthalmology;  Laterality: Left;  Diabetic - insulin and oral meds  . CERVICAL FUSION  1990  . CERVICAL FUSION  8/12   multiple levels with plates  . CHOLESTEATOMA EXCISION  1993  . COLLATERAL LIGAMENT REPAIR, ELBOW     bilateral, nerve improvement left 08/04, right 09/04  . COLONOSCOPY    . CYST EXCISION     back of cervical spine total of 5 surgeries  . EXTERNAL EAR SURGERY     multiple ear surguries as a child  . OTHER SURGICAL HISTORY     benign head tumor#5  . OTHER SURGICAL HISTORY     sebaceous cysts-post neck x5  . SHOULDER ARTHROSCOPY W/ ACROMIAL REPAIR  08/2004   left shoulder  .  TONSILLECTOMY    . ULNAR NERVE TRANSPOSITION Right 05/13/2013   Procedure: RIGHT ULNAR NERVE DECOMPRESSION/TRANSPOSITION;  Surgeon: [name]., MD;  Location: [place];  Service: Orthopedics;  Laterality: Right;    There were no vitals filed for this visit.  Subjective Assessment - 02/04/20 1121    Subjective  Pt pleasant, calm and demonstrates greater understanding of aphasia    Patient is accompained by:  Family member    Currently in Pain?  No/denies       ADULT SLP TREATMENT - 02/04/20 0001      General Information   Behavior/Cognition  Alert;Cooperative;Pleasant mood    HPI  Pt is a 74 y.o. 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         Treatment Provided    Treatment provided  Cognitive-Linquistic      Pain Assessment   Pain Assessment  No/denies pain      Cognitive-Linquistic Treatment   Treatment focused on  Aphasia    Skilled Treatment  Skilled treatment session targeted pt's language goals. SLP facilitated session by providing vocabulary building tasks. Pt able to self-generate opposite word with 87% accuracy. Additionally, pt able to set shift and complete tasks targeting synonyms. Pt was 84% accurate when selecting appropriate word from choice of 3 printed words.        Assessment / Recommendations / Plan   Plan  Continue with current plan of care      Progression Toward Goals   Progression toward goals  Progressing toward goals       SLP Education - 02/04/20 1123    Education Details  education provided on progression of aphasia    Person(s) Educated  Patient;Spouse    Methods  Explanation;Demonstration;Verbal cues    Comprehension  Need further instruction         SLP Long Term Goals - 01/04/20 [place] #1   Title  Pt will utilize word retrieval strategies to communicate sentence level simple linguistic tasks with 75% accuracy.    Baseline  25%    Time  12    Period  Weeks    Status  New    Target Date  03/28/20      SLP LONG TERM GOAL #2   Title  Pt will answer basic yes/no questions with 75% accuracy and Minimal cues.    Baseline  25%    Time  12    Period  Weeks    Status  New    Target Date  03/28/20       Plan - 02/04/20 1125    Speech Therapy Frequency  2x / week    Duration  --   12 weeks   Treatment/Interventions  Patient/family education;Language facilitation    Potential to Achieve Goals  Good    SLP Home Exercise Plan  practice activities targeting opposites and synonyms    Consulted and Agree with Plan of Care  Patient;Family member/caregiver    Family Member Consulted  wife       Patient will benefit from skilled therapeutic intervention in order to improve the following  deficits and impairments:   Aphasia    Problem List Patient Active Problem List   Diagnosis Date Noted  . Stroke ([place]) 12/07/2019  . CVA (  cerebral vascular accident) ([place]) 12/06/2019  . Gait abnormality 11/12/2019  . Tremor, essential 11/12/2019  . Health care maintenance 10/04/2018  . Thumb pain 10/04/2018  . Memory change 12/17/2017  . Paresthesia 12/03/2017  . Lightheaded 08/28/2017  . Encounter for chronic pain management 06/18/2017  . Neck pain 05/14/2017  . Panic 01/10/2017  . Dysuria 03/29/2016  . Muscle ache 10/05/2015  . Syncope 07/08/2015  . Chronic back pain 04/12/2015  . Medicare annual wellness visit, initial 03/08/2015  . Advance care planning 03/08/2015  . Dysgeusia 11/28/2014  . Anxiety state 10/27/2014  . Diabetic polyneuropathy ([place]) 02/14/2014  . Rhinitis, chronic 01/13/2012  . Basal cell carcinoma   . LUMBAR RADICULOPATHY 10/16/2010  . HLD (hyperlipidemia) 05/13/2008  . Abdominal pain 08/11/2007  . Uncontrolled type 2 diabetes mellitus with peripheral neuropathy ([place]) 05/26/2007  . Essential hypertension 05/26/2007  . SCOLIOSIS 05/26/2007  . Tremor 05/26/2007  . COLONIC POLYPS, HX OF 05/26/2007   Darleen Moffitt B. Rutherford Nail M.S., CCC-SLP, [place] Pathologist Rehabilitation Services Office [phone]   Stormy Fabian 02/04/2020, 11:27 AM  [place] MAIN [place] [address] [place], [place], [address] Phone: [phone]   Fax:  [phone]   Name: DIMITRIC KUMER MRN: [id number] Date of Birth: 1946-08-08

## 2020-02-08 ENCOUNTER — Ambulatory Visit: Payer: PPO | Attending: Family Medicine | Admitting: Speech Pathology

## 2020-02-08 ENCOUNTER — Other Ambulatory Visit: Payer: Self-pay

## 2020-02-08 DIAGNOSIS — R4701 Aphasia: Secondary | ICD-10-CM | POA: Insufficient documentation

## 2020-02-09 ENCOUNTER — Encounter: Payer: PPO | Admitting: Occupational Therapy

## 2020-02-09 ENCOUNTER — Encounter: Payer: Self-pay | Admitting: Speech Pathology

## 2020-02-09 ENCOUNTER — Ambulatory Visit: Payer: PPO | Admitting: Physical Therapy

## 2020-02-09 NOTE — Therapy (Addendum)
[place] MAIN [facility] SERVICES [address] [place], [place], [address] Phone: [phone]   Fax:  [phone]  Speech Language Pathology Treatment  Patient Details  Name: Walter Harris MRN: [id number] Date of Birth: 03-Nov-1945 Referring Provider (SLP): Elsie Stain   Encounter Date: 02/08/2020  End of Session - 02/09/20 1239    Visit Number  9    Number of Visits  25    Date for SLP Re-Evaluation  03/28/20    Authorization Type  [employer]    Authorization Time Period  01/04/2020 - 03/28/2020    Authorization - Visit Number  8    Authorization - Number of Visits  25    Progress Note Due on Visit  10    SLP Start Time  1100    SLP Stop Time   1200    SLP Time Calculation (min)  60 min    Activity Tolerance  Patient tolerated treatment well       Past Medical History:  Diagnosis Date  . Basal cell carcinoma    multiple  . Cataracts, both eyes 2019   surgery pending April 2020  . Complication of anesthesia    post-operative cognitive dysfunction after ACDF on 05/01/11 [facility])  . Deaf    right ear  . Diabetes mellitus   . DJD (degenerative joint disease)    lumbar spine  . Gait abnormality 11/12/2019  . Hearing impaired    hearing aids-transmitter rt   . History of blood transfusion    as a baby in 67  . History of colonic polyps   . Hyperlipidemia   . Hypertension    pt. denies high blood pressure  . Kidney stone 1967  . Memory change 12/17/2017  . Nose fracture    3x  . OCD (obsessive compulsive disorder)   . Stroke [facility])    "mini stroke, that's what gave me the tremors"  . Stuttering   . Tremor, essential   . Vertigo    random  . Wears dentures    full upper  . Wears glasses     Past Surgical History:  Procedure Laterality Date  . APPENDECTOMY  1960  . BACK SURGERY  2012   lumbar surgery  . CATARACT EXTRACTION W/PHACO Right 04/12/2019   Procedure: CATARACT EXTRACTION PHACO AND INTRAOCULAR LENS  PLACEMENT (IOC) RIGHT, DIABETIC;  Surgeon: [name], MD;  Location: [place];  Service: Ophthalmology;  Laterality: Right;  Diabetic - insulin and oral meds  . CATARACT EXTRACTION W/PHACO Left 05/10/2019   Procedure: CATARACT EXTRACTION PHACO AND INTRAOCULAR LENS PLACEMENT (IOC)  LEFT DIABETIC  00:25.6  14.4%  3.75;  Surgeon: [name], MD;  Location: [place];  Service: Ophthalmology;  Laterality: Left;  Diabetic - insulin and oral meds  . CERVICAL FUSION  1990  . CERVICAL FUSION  8/12   multiple levels with plates  . CHOLESTEATOMA EXCISION  1993  . COLLATERAL LIGAMENT REPAIR, ELBOW     bilateral, nerve improvement left 08/04, right 09/04  . COLONOSCOPY    . CYST EXCISION     back of cervical spine total of 5 surgeries  . EXTERNAL EAR SURGERY     multiple ear surguries as a child  . OTHER SURGICAL HISTORY     benign head tumor#5  . OTHER SURGICAL HISTORY     sebaceous cysts-post neck x5  . SHOULDER ARTHROSCOPY W/ ACROMIAL REPAIR  08/2004   left shoulder  .  TONSILLECTOMY    . ULNAR NERVE TRANSPOSITION Right 05/13/2013   Procedure: RIGHT ULNAR NERVE DECOMPRESSION/TRANSPOSITION;  Surgeon: [name]., MD;  Location: [place];  Service: Orthopedics;  Laterality: Right;    There were no vitals filed for this visit.  Subjective Assessment - 02/09/20 1221    Subjective  Pt pleasant, calm and demonstrates greater understanding of aphasia    Patient is accompained by:  Family member    Currently in Pain?  No/denies       ADULT SLP TREATMENT - 02/09/20 0001      General Information   Behavior/Cognition  Alert;Cooperative;Pleasant mood    Patient Positioning  Upright in chair    Oral care provided  N/A    HPI  Pt is a 74 y.o. 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         Treatment Provided   Treatment provided  Cognitive-Linquistic      Pain Assessment   Pain Assessment  No/denies pain      Cognitive-Linquistic Treatment   Treatment focused on  Aphasia    Skilled Treatment Skilled treatment session targeted pt's expressive language goals. SLP facilitated session by providing review of activity from previous session that targeted synonyms. Pt required maximal cues to generate appropriate synonyms for words listed. To aid in generation of language, functional sentences were written with targeted words underlined.       Assessment / Recommendations / Plan   Plan  Continue with current plan of care      Progression Toward Goals   Progression toward goals  Progressing toward goals       SLP Education - 02/09/20 1237    Education Details  education provided on progress towards goals    Person(s) Educated  Patient;Spouse    Methods  Explanation;Demonstration;Verbal cues    Comprehension  Verbalized understanding         SLP Long Term Goals - 01/04/20 1608      SLP LONG TERM GOAL #1   Title  Pt will utilize word retrieval strategies to communicate sentence level simple linguistic tasks with 75% accuracy.    Baseline  25%    Time  12    Period  Weeks    Status  New    Target Date  03/28/20      SLP LONG TERM GOAL #2   Title  Pt will answer basic yes/no questions with 75% accuracy and Minimal cues.    Baseline  25%    Time  12    Period  Weeks    Status  New    Target Date  03/28/20       Plan - 02/09/20 1240    Clinical Impression Statement  Pt continues to eagerly complete tasks. As such, he continues to progress within structured tasks as well as conversational ability. During pt's most recent session he produced connected sentences that were free of word finding with increased frequency. Wife also confirms continued increased in fluency within his home environment.    Speech Therapy Frequency  2x /  week    Duration  --   12 weeks   Treatment/Interventions  Patient/family education;Language facilitation    Potential to Achieve Goals  Good    SLP Home Exercise Plan  practice activities targeting opposites and synonyms  Consulted and Agree with Plan of Care  Patient;Family member/caregiver    Family Member Consulted  wife       Patient will benefit from skilled therapeutic intervention in order to improve the following deficits and impairments:   Aphasia    Problem List Patient Active Problem List   Diagnosis Date Noted  . Stroke ([place]) 12/07/2019  . CVA (cerebral vascular accident) ([place]) 12/06/2019  . Gait abnormality 11/12/2019  . Tremor, essential 11/12/2019  . Health care maintenance 10/04/2018  . Thumb pain 10/04/2018  . Memory change 12/17/2017  . Paresthesia 12/03/2017  . Lightheaded 08/28/2017  . Encounter for chronic pain management 06/18/2017  . Neck pain 05/14/2017  . Panic 01/10/2017  . Dysuria 03/29/2016  . Muscle ache 10/05/2015  . Syncope 07/08/2015  . Chronic back pain 04/12/2015  . Medicare annual wellness visit, initial 03/08/2015  . Advance care planning 03/08/2015  . Dysgeusia 11/28/2014  . Anxiety state 10/27/2014  . Diabetic polyneuropathy ([place]) 02/14/2014  . Rhinitis, chronic 01/13/2012  . Basal cell carcinoma   . LUMBAR RADICULOPATHY 10/16/2010  . HLD (hyperlipidemia) 05/13/2008  . Abdominal pain 08/11/2007  . Uncontrolled type 2 diabetes mellitus with peripheral neuropathy ([place]) 05/26/2007  . Essential hypertension 05/26/2007  . SCOLIOSIS 05/26/2007  . Tremor 05/26/2007  . COLONIC POLYPS, HX OF 05/26/2007   Patches Mcdonnell B. Rutherford Nail M.S., CCC-SLP, [place] Pathologist Rehabilitation Services Office [phone]   Stormy Fabian 02/09/2020, 12:41 PM  [place] MAIN [facility] SERVICES [address] [place], [place], [address] Phone: [phone]   Fax:  [phone]   Name: BARTOLOMEO SACHAR MRN: [id number] Date of Birth: 1945/10/07

## 2020-02-10 ENCOUNTER — Encounter: Payer: Self-pay | Admitting: Speech Pathology

## 2020-02-10 ENCOUNTER — Ambulatory Visit: Payer: PPO | Admitting: Speech Pathology

## 2020-02-10 ENCOUNTER — Other Ambulatory Visit: Payer: Self-pay

## 2020-02-10 DIAGNOSIS — R4701 Aphasia: Secondary | ICD-10-CM

## 2020-02-10 NOTE — Therapy (Signed)
[place] MAIN [facility] SERVICES [address] [place], [place], [address] Phone: [phone]   Fax:  [phone]    Speech Language Pathology Treatment/Progress Summary  Speech Therapy Progress Note   Dates of reporting period  01/04/2020   to   02/10/2020  Patient Details  Name: Walter Harris MRN: [id number] Date of Birth: Jan 22, 1946 Referring Provider (SLP): Elsie Stain   Encounter Date: 02/10/2020  End of Session - 02/10/20 1434    Visit Number  10    Number of Visits  25    Date for SLP Re-Evaluation  03/28/20    Authorization Type  [employer]    Authorization Time Period  01/04/2020 - 03/28/2020    Authorization - Visit Number  10    Progress Note Due on Visit  10    SLP Start Time  1110    SLP Stop Time   1210    SLP Time Calculation (min)  60 min    Activity Tolerance  Patient tolerated treatment well       Past Medical History:  Diagnosis Date  . Basal cell carcinoma    multiple  . Cataracts, both eyes 2019   surgery pending April 2020  . Complication of anesthesia    post-operative cognitive dysfunction after ACDF on 05/01/11 [facility])  . Deaf    right ear  . Diabetes mellitus   . DJD (degenerative joint disease)    lumbar spine  . Gait abnormality 11/12/2019  . Hearing impaired    hearing aids-transmitter rt   . History of blood transfusion    as a baby in 65  . History of colonic polyps   . Hyperlipidemia   . Hypertension    pt. denies high blood pressure  . Kidney stone 1967  . Memory change 12/17/2017  . Nose fracture    3x  . OCD (obsessive compulsive disorder)   . Stroke [facility])    "mini stroke, that's what gave me the tremors"  . Stuttering   . Tremor, essential   . Vertigo    random  . Wears dentures    full upper  . Wears glasses     Past Surgical History:  Procedure Laterality Date  . APPENDECTOMY  1960  . BACK SURGERY  2012   lumbar surgery  . CATARACT EXTRACTION W/PHACO Right  04/12/2019   Procedure: CATARACT EXTRACTION PHACO AND INTRAOCULAR LENS PLACEMENT (IOC) RIGHT, DIABETIC;  Surgeon: [name], MD;  Location: [place];  Service: Ophthalmology;  Laterality: Right;  Diabetic - insulin and oral meds  . CATARACT EXTRACTION W/PHACO Left 05/10/2019   Procedure: CATARACT EXTRACTION PHACO AND INTRAOCULAR LENS PLACEMENT (IOC)  LEFT DIABETIC  00:25.6  14.4%  3.75;  Surgeon: [name], MD;  Location: [place];  Service: Ophthalmology;  Laterality: Left;  Diabetic - insulin and oral meds  . CERVICAL FUSION  1990  . CERVICAL FUSION  8/12   multiple levels with plates  . CHOLESTEATOMA EXCISION  1993  . COLLATERAL LIGAMENT REPAIR, ELBOW     bilateral, nerve improvement left 08/04, right 09/04  . COLONOSCOPY    . CYST EXCISION     back of cervical spine total of 5 surgeries  . EXTERNAL EAR SURGERY     multiple ear surguries as a child  . OTHER SURGICAL HISTORY     benign head tumor#5  . OTHER SURGICAL HISTORY     sebaceous cysts-post neck x5  . SHOULDER  ARTHROSCOPY W/ ACROMIAL REPAIR  08/2004   left shoulder  . TONSILLECTOMY    . ULNAR NERVE TRANSPOSITION Right 05/13/2013   Procedure: RIGHT ULNAR NERVE DECOMPRESSION/TRANSPOSITION;  Surgeon: [name]., MD;  Location: [place];  Service: Orthopedics;  Laterality: Right;    There were no vitals filed for this visit.  Subjective Assessment - 02/10/20 1432    Subjective  pt appeared frustrated, explained he was overall struggling    Patient is accompained by:  Family member    Currently in Pain?  No/denies       ADULT SLP TREATMENT - 02/10/20 0001      General Information   Behavior/Cognition  Alert;Pleasant mood   overall was struggling   HPI  Pt is a 74 y.o. 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         Treatment Provided   Treatment provided  Cognitive-Linquistic      Pain Assessment   Pain Assessment  No/denies pain      Cognitive-Linquistic Treatment   Treatment focused on  Aphasia    Skilled Treatment  100% accuracy with selecting word based on definition in field of 3 printed words; Minimal verbal cues to achieve 90% accuracy generating target word based on definition; Moderate cues to generate 2 additional words for objects based on description: 100% accuracy independently stating associated words       Assessment / Recommendations / [place] with current plan of care      Progression Toward Goals   Progression toward goals  Progressing toward goals       SLP Education - 02/10/20 1433    Education Details  education provided on recovery process    Person(s) Educated  Patient;Spouse    Methods  Explanation;Demonstration;Verbal cues    Comprehension  Verbalized understanding         SLP Long Term Goals - 02/10/20 1435      SLP LONG TERM GOAL #1   Title  Pt will utilize word retrieval strategies to communicate sentence level simple linguistic tasks with 75% accuracy.    Baseline  25%    Time  12    Period  Weeks    Status  Partially Met    Target Date  03/28/20      SLP LONG TERM GOAL #2   Title  Pt will answer basic yes/no questions with 75% accuracy and Minimal cues.    Baseline  25%    Time  12    Period  Weeks    Status  Achieved    Target Date  03/28/20       Plan - 02/10/20 1435    Clinical Impression Statement  Pt continues to demonstrate improved word finding skills thru vocabulary building activities. This appeared to be a more difficult day for pt. He confirmed that his motor movements were worse, he didn't't feel well and overall, everything was more difficult for him today. His wife reports that he had not taken his medicine for anxiety this morning. As a result, pt had more difficulty with  synonyms. Alternate task of definitions and associations were provided.   Over the last 10 treatment sessions, pt has made good progress towards his LTGs. While he continues to have more  sentences during spontaneous conversation, he  continues to require overall moderate to occasional maximal effort to utilize word finding strategies. As such, skilled ST is medically neccessary to improve pt's ability to communicate wants and needs.    Speech Therapy Frequency  2x / week    Duration  --   12 weeks      Patient will benefit from skilled therapeutic intervention in order to improve the following deficits and impairments:   Aphasia    Problem List Patient Active Problem List   Diagnosis Date Noted  . Stroke ([place]) 12/07/2019  . CVA (cerebral vascular accident) ([place]) 12/06/2019  . Gait abnormality 11/12/2019  . Tremor, essential 11/12/2019  . Health care maintenance 10/04/2018  . Thumb pain 10/04/2018  . Memory change 12/17/2017  . Paresthesia 12/03/2017  . Lightheaded 08/28/2017  . Encounter for chronic pain management 06/18/2017  . Neck pain 05/14/2017  . Panic 01/10/2017  . Dysuria 03/29/2016  . Muscle ache 10/05/2015  . Syncope 07/08/2015  . Chronic back pain 04/12/2015  . Medicare annual wellness visit, initial 03/08/2015  . Advance care planning 03/08/2015  . Dysgeusia 11/28/2014  . Anxiety state 10/27/2014  . Diabetic polyneuropathy ([place]) 02/14/2014  . Rhinitis, chronic 01/13/2012  . Basal cell carcinoma   . LUMBAR RADICULOPATHY 10/16/2010  . HLD (hyperlipidemia) 05/13/2008  . Abdominal pain 08/11/2007  . Uncontrolled type 2 diabetes mellitus with peripheral neuropathy ([place]) 05/26/2007  . Essential hypertension 05/26/2007  . SCOLIOSIS 05/26/2007  . Tremor 05/26/2007  . COLONIC POLYPS, HX OF 05/26/2007   Adely Facer B. Rutherford Nail M.S., CCC-SLP, [place] Pathologist Rehabilitation Services Office [phone]  Stormy Fabian 02/10/2020, 2:40 PM  [place] MAIN [facility] SERVICES [address] [place], [place], [address] Phone: [phone]   Fax:  [phone]   Name: Walter Harris MRN: [id number] Date of Birth: 12/02/1945

## 2020-02-15 ENCOUNTER — Other Ambulatory Visit: Payer: Self-pay

## 2020-02-15 ENCOUNTER — Ambulatory Visit: Payer: PPO | Admitting: Speech Pathology

## 2020-02-15 DIAGNOSIS — R4701 Aphasia: Secondary | ICD-10-CM | POA: Diagnosis not present

## 2020-02-16 ENCOUNTER — Encounter: Payer: Self-pay | Admitting: Speech Pathology

## 2020-02-16 NOTE — Therapy (Signed)
[place] MAIN [facility] SERVICES [address] [place], [place], [address] Phone: [phone]   Fax:  [phone]  Speech Language Pathology Treatment  Patient Details  Name: Walter Harris MRN: [id number] Date of Birth: 1946-09-02 Referring Provider (SLP): Elsie Stain   Encounter Date: 02/15/2020  End of Session - 02/16/20 1303    Visit Number  11    Number of Visits  25    Date for SLP Re-Evaluation  03/28/20    Authorization Type  [employer]    Authorization Time Period  01/04/2020 - 03/28/2020    Authorization - Visit Number  1    Progress Note Due on Visit  10    SLP Start Time  1100    SLP Stop Time   1200    SLP Time Calculation (min)  60 min    Activity Tolerance  Patient tolerated treatment well       Past Medical History:  Diagnosis Date  . Basal cell carcinoma    multiple  . Cataracts, both eyes 2019   surgery pending April 2020  . Complication of anesthesia    post-operative cognitive dysfunction after ACDF on 05/01/11 [facility])  . Deaf    right ear  . Diabetes mellitus   . DJD (degenerative joint disease)    lumbar spine  . Gait abnormality 11/12/2019  . Hearing impaired    hearing aids-transmitter rt   . History of blood transfusion    as a baby in 14  . History of colonic polyps   . Hyperlipidemia   . Hypertension    pt. denies high blood pressure  . Kidney stone 1967  . Memory change 12/17/2017  . Nose fracture    3x  . OCD (obsessive compulsive disorder)   . Stroke [facility])    "mini stroke, that's what gave me the tremors"  . Stuttering   . Tremor, essential   . Vertigo    random  . Wears dentures    full upper  . Wears glasses     Past Surgical History:  Procedure Laterality Date  . APPENDECTOMY  1960  . BACK SURGERY  2012   lumbar surgery  . CATARACT EXTRACTION W/PHACO Right 04/12/2019   Procedure: CATARACT EXTRACTION PHACO AND INTRAOCULAR LENS PLACEMENT (IOC) RIGHT, DIABETIC;  Surgeon: [name], MD;  Location: [place];  Service: Ophthalmology;  Laterality: Right;  Diabetic - insulin and oral meds  . CATARACT EXTRACTION W/PHACO Left 05/10/2019   Procedure: CATARACT EXTRACTION PHACO AND INTRAOCULAR LENS PLACEMENT (IOC)  LEFT DIABETIC  00:25.6  14.4%  3.75;  Surgeon: [name], MD;  Location: [place];  Service: Ophthalmology;  Laterality: Left;  Diabetic - insulin and oral meds  . CERVICAL FUSION  1990  . CERVICAL FUSION  8/12   multiple levels with plates  . CHOLESTEATOMA EXCISION  1993  . COLLATERAL LIGAMENT REPAIR, ELBOW     bilateral, nerve improvement left 08/04, right 09/04  . COLONOSCOPY    . CYST EXCISION     back of cervical spine total of 5 surgeries  . EXTERNAL EAR SURGERY     multiple ear surguries as a child  . OTHER SURGICAL HISTORY     benign head tumor#5  . OTHER SURGICAL HISTORY     sebaceous cysts-post neck x5  . SHOULDER ARTHROSCOPY W/ ACROMIAL REPAIR  08/2004   left shoulder  . TONSILLECTOMY    . ULNAR NERVE TRANSPOSITION Right 05/13/2013  Procedure: RIGHT ULNAR NERVE DECOMPRESSION/TRANSPOSITION;  Surgeon: [name]., MD;  Location: [place];  Service: Orthopedics;  Laterality: Right;    There were no vitals filed for this visit.  Subjective Assessment - 02/16/20 1302    Subjective  pt appeared distressed, discouraged    Patient is accompained by:  Family member    Currently in Pain?  No/denies    Multiple Pain Sites  No            ADULT SLP TREATMENT - 02/16/20 0001      General Information   Behavior/Cognition  Alert;Pleasant mood   overall was struggling   HPI  Pt is a 74 y.o. 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         Treatment Provided   Treatment provided   Cognitive-Linquistic      Pain Assessment   Pain Assessment  No/denies pain      Cognitive-Linquistic Treatment   Treatment focused on  Aphasia    Skilled Treatment  Pt appeared distressed. He endorses feelings of depression, difficulty with task motivation as "everything is just so hard" and his wife provides that there is very little for the pt "to do during the day." Treatment session spent identifying high interest tasks that would facilitate language during pt's day and would consume some of pt's empty time.        Assessment / Recommendations / Plan   Plan  Continue with current plan of care      Progression Toward Goals   Progression toward goals  Progressing toward goals       SLP Education - 02/16/20 1303    Education Details  education provided on recovery process    Person(s) Educated  Patient;Spouse    Methods  Explanation;Demonstration;Verbal cues    Comprehension  Need further instruction         SLP Long Term Goals - 02/10/20 1435      SLP LONG TERM GOAL #1   Title  Pt will utilize word retrieval strategies to communicate sentence level simple linguistic tasks with 75% accuracy.    Baseline  25%    Time  12    Period  Weeks    Status  Partially Met    Target Date  03/28/20      SLP LONG TERM GOAL #2   Title  Pt will answer basic yes/no questions with 75% accuracy and Minimal cues.    Baseline  25%    Time  12    Period  Weeks    Status  Achieved    Target Date  03/28/20       Plan - 02/16/20 1305    Clinical Impression Statement  Skilled treatment session targeted pt's communication goals. SLP facilitated session by generating list of activities within pt's home to facilitate language as well as decreased pt's feeling of "doing nothing all day." Identified tasks were broken into series and planned out for pt and his wife. This Probation officer also sent a secured chart to pt's internal medicine doctor regarding pt's report of depression. Skilled ST continues to be  required to target expressive aphasia.    Speech Therapy Frequency  2x / week    Duration  --   12 weeks   Treatment/Interventions  Patient/family education;Language facilitation  Potential to Achieve Goals  Good    SLP Home Exercise Plan  given    Consulted and Agree with Plan of Care  Patient;Family member/caregiver    Family Member Consulted  wife       Patient will benefit from skilled therapeutic intervention in order to improve the following deficits and impairments:   Aphasia    Problem List Patient Active Problem List   Diagnosis Date Noted  . Stroke ([place]) 12/07/2019  . CVA (cerebral vascular accident) ([place]) 12/06/2019  . Gait abnormality 11/12/2019  . Tremor, essential 11/12/2019  . Health care maintenance 10/04/2018  . Thumb pain 10/04/2018  . Memory change 12/17/2017  . Paresthesia 12/03/2017  . Lightheaded 08/28/2017  . Encounter for chronic pain management 06/18/2017  . Neck pain 05/14/2017  . Panic 01/10/2017  . Dysuria 03/29/2016  . Muscle ache 10/05/2015  . Syncope 07/08/2015  . Chronic back pain 04/12/2015  . Medicare annual wellness visit, initial 03/08/2015  . Advance care planning 03/08/2015  . Dysgeusia 11/28/2014  . Anxiety state 10/27/2014  . Diabetic polyneuropathy ([place]) 02/14/2014  . Rhinitis, chronic 01/13/2012  . Basal cell carcinoma   . LUMBAR RADICULOPATHY 10/16/2010  . HLD (hyperlipidemia) 05/13/2008  . Abdominal pain 08/11/2007  . Uncontrolled type 2 diabetes mellitus with peripheral neuropathy ([place]) 05/26/2007  . Essential hypertension 05/26/2007  . SCOLIOSIS 05/26/2007  . Tremor 05/26/2007  . COLONIC POLYPS, HX OF 05/26/2007   Mearl Harewood B. Rutherford Nail M.S., CCC-SLP, [place] Pathologist Rehabilitation Services Office [phone]  Stormy Fabian 02/16/2020, 1:06 PM  [place] MAIN [facility] SERVICES [address] [place], [place], [address] Phone: [phone]   Fax:   [phone]   Name: JAIMIN KRUPKA MRN: [id number] Date of Birth: [birth year]

## 2020-02-17 ENCOUNTER — Other Ambulatory Visit: Payer: Self-pay

## 2020-02-17 ENCOUNTER — Ambulatory Visit: Payer: PPO | Admitting: Speech Pathology

## 2020-02-17 DIAGNOSIS — F5101 Primary insomnia: Secondary | ICD-10-CM | POA: Diagnosis not present

## 2020-02-17 DIAGNOSIS — I10 Essential (primary) hypertension: Secondary | ICD-10-CM | POA: Diagnosis not present

## 2020-02-17 DIAGNOSIS — M545 Low back pain: Secondary | ICD-10-CM | POA: Diagnosis not present

## 2020-02-17 DIAGNOSIS — Z794 Long term (current) use of insulin: Secondary | ICD-10-CM | POA: Diagnosis not present

## 2020-02-17 DIAGNOSIS — R4701 Aphasia: Secondary | ICD-10-CM

## 2020-02-17 DIAGNOSIS — E1169 Type 2 diabetes mellitus with other specified complication: Secondary | ICD-10-CM | POA: Diagnosis not present

## 2020-02-17 DIAGNOSIS — G8929 Other chronic pain: Secondary | ICD-10-CM | POA: Diagnosis not present

## 2020-02-17 DIAGNOSIS — E1165 Type 2 diabetes mellitus with hyperglycemia: Secondary | ICD-10-CM | POA: Diagnosis not present

## 2020-02-17 DIAGNOSIS — R251 Tremor, unspecified: Secondary | ICD-10-CM | POA: Diagnosis not present

## 2020-02-17 DIAGNOSIS — F320 Major depressive disorder, single episode, mild: Secondary | ICD-10-CM | POA: Diagnosis not present

## 2020-02-17 DIAGNOSIS — E785 Hyperlipidemia, unspecified: Secondary | ICD-10-CM | POA: Diagnosis not present

## 2020-02-17 DIAGNOSIS — Z789 Other specified health status: Secondary | ICD-10-CM | POA: Diagnosis not present

## 2020-02-18 ENCOUNTER — Encounter: Payer: Self-pay | Admitting: Speech Pathology

## 2020-02-18 NOTE — Therapy (Signed)
[place] MAIN [facility] SERVICES [address] [place], [place], [address] Phone: [phone]   Fax:  [phone]  Speech Language Pathology Treatment  Patient Details  Name: Walter Harris MRN: [id number] Date of Birth: May 05, 1946 Referring Provider (SLP): Elsie Stain   Encounter Date: 02/17/2020   End of Session - 02/18/20 1520    Visit Number 12    Number of Visits 25    Date for SLP Re-Evaluation 03/28/20    Authorization Type [employer]    Authorization Time Period 01/04/2020 - 03/28/2020    Authorization - Visit Number 2    Progress Note Due on Visit 10    SLP Start Time 1100    SLP Stop Time  1200    SLP Time Calculation (min) 60 min    Activity Tolerance Patient tolerated treatment well           Past Medical History:  Diagnosis Date  . Basal cell carcinoma    multiple  . Cataracts, both eyes 2019   surgery pending April 2020  . Complication of anesthesia    post-operative cognitive dysfunction after ACDF on 05/01/11 [facility])  . Deaf    right ear  . Diabetes mellitus   . DJD (degenerative joint disease)    lumbar spine  . Gait abnormality 11/12/2019  . Hearing impaired    hearing aids-transmitter rt   . History of blood transfusion    as a baby in 57  . History of colonic polyps   . Hyperlipidemia   . Hypertension    pt. denies high blood pressure  . Kidney stone 1967  . Memory change 12/17/2017  . Nose fracture    3x  . OCD (obsessive compulsive disorder)   . Stroke [facility])    "mini stroke, that's what gave me the tremors"  . Stuttering   . Tremor, essential   . Vertigo    random  . Wears dentures    full upper  . Wears glasses     Past Surgical History:  Procedure Laterality Date  . APPENDECTOMY  1960  . BACK SURGERY  2012   lumbar surgery  . CATARACT EXTRACTION W/PHACO Right 04/12/2019   Procedure: CATARACT EXTRACTION PHACO AND INTRAOCULAR LENS PLACEMENT (IOC) RIGHT, DIABETIC;  Surgeon: [name], MD;  Location: [place];  Service: Ophthalmology;  Laterality: Right;  Diabetic - insulin and oral meds  . CATARACT EXTRACTION W/PHACO Left 05/10/2019   Procedure: CATARACT EXTRACTION PHACO AND INTRAOCULAR LENS PLACEMENT (IOC)  LEFT DIABETIC  00:25.6  14.4%  3.75;  Surgeon: [name], MD;  Location: [place];  Service: Ophthalmology;  Laterality: Left;  Diabetic - insulin and oral meds  . CERVICAL FUSION  1990  . CERVICAL FUSION  8/12   multiple levels with plates  . CHOLESTEATOMA EXCISION  1993  . COLLATERAL LIGAMENT REPAIR, ELBOW     bilateral, nerve improvement left 08/04, right 09/04  . COLONOSCOPY    . CYST EXCISION     back of cervical spine total of 5 surgeries  . EXTERNAL EAR SURGERY     multiple ear surguries as a child  . OTHER SURGICAL HISTORY     benign head tumor#5  . OTHER SURGICAL HISTORY     sebaceous cysts-post neck x5  . SHOULDER ARTHROSCOPY W/ ACROMIAL REPAIR  08/2004   left shoulder  . TONSILLECTOMY    . ULNAR NERVE TRANSPOSITION Right 05/13/2013   Procedure: RIGHT ULNAR NERVE  DECOMPRESSION/TRANSPOSITION;  Surgeon: [name]., MD;  Location: [place];  Service: Orthopedics;  Laterality: Right;    There were no vitals filed for this visit.   Subjective Assessment - 02/18/20 1519    Subjective Pt continues to voice discouragement    Patient is accompained by: Family member    Currently in Pain? No/denies                 ADULT SLP TREATMENT - 02/18/20 0001      General Information   Behavior/Cognition Alert;Pleasant mood   overall was struggling   HPI Pt is a 74 y.o. 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         Treatment Provided   Treatment provided Cognitive-Linquistic      Pain  Assessment   Pain Assessment No/denies pain      Cognitive-Linquistic Treatment   Treatment focused on Aphasia    Skilled Treatment Vocabulary: ASSOCIATIONS:  80% independently; stating word based on its definition - increased time required and more frustrated by pt, he states that he doesn't understand takes; Semantic Feature Analysis introduced - Maximal cues for categorizing       Assessment / Recommendations / Plan   Plan Continue with current plan of care      Progression Toward Goals   Progression toward goals Progressing toward goals            SLP Education - 02/18/20 1520    Education Details Semantic Feature Analysis    Person(s) Educated Patient;Spouse    Methods Explanation;Demonstration;Verbal cues    Comprehension Need further instruction              SLP Long Term Goals - 02/10/20 1435      SLP LONG TERM GOAL #1   Title Pt will utilize word retrieval strategies to communicate sentence level simple linguistic tasks with 75% accuracy.    Baseline 25%    Time 12    Period Weeks    Status Partially Met    Target Date 03/28/20      SLP LONG TERM GOAL #2   Title Pt will answer basic yes/no questions with 75% accuracy and Minimal cues.    Baseline 25%    Time 12    Period Weeks    Status Achieved    Target Date 03/28/20            Plan - 02/18/20 1520    Clinical Impression Statement Skilled treatment session targeted pt's expressive language goals. SLP facilitated session by targeting vocabulary building activities. Pt with increased accuracy when generating words based on association. He has more word finding difficulty when producing words based on descriptions/definitions. SLP further facilitated session by introducing Semantic Feature Analysis.    Speech Therapy Frequency 2x / week    Duration --   12 weeks   Treatment/Interventions Patient/family education;Language facilitation    Potential to Achieve Goals Good    SLP Home Exercise Plan given     Consulted and Agree with Plan of Care Patient;Family member/caregiver    Family Member Consulted wife           Patient will benefit from skilled therapeutic intervention in order to improve the following deficits and impairments:   Aphasia    Problem List Patient Active Problem List  Diagnosis Date Noted  . Stroke ([place]) 12/07/2019  . CVA (cerebral vascular accident) ([place]) 12/06/2019  . Gait abnormality 11/12/2019  . Tremor, essential 11/12/2019  . Health care maintenance 10/04/2018  . Thumb pain 10/04/2018  . Memory change 12/17/2017  . Paresthesia 12/03/2017  . Lightheaded 08/28/2017  . Encounter for chronic pain management 06/18/2017  . Neck pain 05/14/2017  . Panic 01/10/2017  . Dysuria 03/29/2016  . Muscle ache 10/05/2015  . Syncope 07/08/2015  . Chronic back pain 04/12/2015  . Medicare annual wellness visit, initial 03/08/2015  . Advance care planning 03/08/2015  . Dysgeusia 11/28/2014  . Anxiety state 10/27/2014  . Diabetic polyneuropathy ([place]) 02/14/2014  . Rhinitis, chronic 01/13/2012  . Basal cell carcinoma   . LUMBAR RADICULOPATHY 10/16/2010  . HLD (hyperlipidemia) 05/13/2008  . Abdominal pain 08/11/2007  . Uncontrolled type 2 diabetes mellitus with peripheral neuropathy ([place]) 05/26/2007  . Essential hypertension 05/26/2007  . SCOLIOSIS 05/26/2007  . Tremor 05/26/2007  . COLONIC POLYPS, HX OF 05/26/2007   Baby [place] B. Rutherford Nail M.S., CCC-SLP, [place] Pathologist Rehabilitation Services Office [phone]  Stormy Fabian 02/18/2020, 3:21 PM  [place] MAIN [facility] SERVICES [address] [place], [place], [address] Phone: [phone]   Fax:  [phone]   Name: KEIFER HABIB MRN: [id number] Date of Birth: 10-Nov-1945

## 2020-02-22 ENCOUNTER — Ambulatory Visit: Payer: PPO | Admitting: Speech Pathology

## 2020-02-22 ENCOUNTER — Encounter: Payer: Self-pay | Admitting: Speech Pathology

## 2020-02-22 ENCOUNTER — Other Ambulatory Visit: Payer: Self-pay

## 2020-02-22 DIAGNOSIS — R4701 Aphasia: Secondary | ICD-10-CM | POA: Diagnosis not present

## 2020-02-22 NOTE — Therapy (Signed)
[place] MAIN [facility] SERVICES [address] [place], [place], [address] Phone: [phone]   Fax:  [phone]  Speech Language Pathology Treatment  Patient Details  Name: Walter Harris MRN: [id number] Date of Birth: 1946/04/15 Referring Provider (SLP): Elsie Stain   Encounter Date: 02/22/2020   End of Session - 02/22/20 1326    Visit Number 13    Number of Visits 25    Date for SLP Re-Evaluation 03/28/20    Authorization Type [employer]    Authorization Time Period 01/04/2020 - 03/28/2020    Authorization - Visit Number 3    Progress Note Due on Visit 10    SLP Start Time 1000    SLP Stop Time  1100    SLP Time Calculation (min) 60 min    Activity Tolerance Patient tolerated treatment well           Past Medical History:  Diagnosis Date  . Basal cell carcinoma    multiple  . Cataracts, both eyes 2019   surgery pending April 2020  . Complication of anesthesia    post-operative cognitive dysfunction after ACDF on 05/01/11 [facility])  . Deaf    right ear  . Diabetes mellitus   . DJD (degenerative joint disease)    lumbar spine  . Gait abnormality 11/12/2019  . Hearing impaired    hearing aids-transmitter rt   . History of blood transfusion    as a baby in 24  . History of colonic polyps   . Hyperlipidemia   . Hypertension    pt. denies high blood pressure  . Kidney stone 1967  . Memory change 12/17/2017  . Nose fracture    3x  . OCD (obsessive compulsive disorder)   . Stroke [facility])    "mini stroke, that's what gave me the tremors"  . Stuttering   . Tremor, essential   . Vertigo    random  . Wears dentures    full upper  . Wears glasses     Past Surgical History:  Procedure Laterality Date  . APPENDECTOMY  1960  . BACK SURGERY  2012   lumbar surgery  . CATARACT EXTRACTION W/PHACO Right 04/12/2019   Procedure: CATARACT EXTRACTION PHACO AND INTRAOCULAR LENS PLACEMENT (IOC) RIGHT, DIABETIC;  Surgeon: [name], MD;  Location: [place];  Service: Ophthalmology;  Laterality: Right;  Diabetic - insulin and oral meds  . CATARACT EXTRACTION W/PHACO Left 05/10/2019   Procedure: CATARACT EXTRACTION PHACO AND INTRAOCULAR LENS PLACEMENT (IOC)  LEFT DIABETIC  00:25.6  14.4%  3.75;  Surgeon: [name], MD;  Location: [place];  Service: Ophthalmology;  Laterality: Left;  Diabetic - insulin and oral meds  . CERVICAL FUSION  1990  . CERVICAL FUSION  8/12   multiple levels with plates  . CHOLESTEATOMA EXCISION  1993  . COLLATERAL LIGAMENT REPAIR, ELBOW     bilateral, nerve improvement left 08/04, right 09/04  . COLONOSCOPY    . CYST EXCISION     back of cervical spine total of 5 surgeries  . EXTERNAL EAR SURGERY     multiple ear surguries as a child  . OTHER SURGICAL HISTORY     benign head tumor#5  . OTHER SURGICAL HISTORY     sebaceous cysts-post neck x5  . SHOULDER ARTHROSCOPY W/ ACROMIAL REPAIR  08/2004   left shoulder  . TONSILLECTOMY    . ULNAR NERVE TRANSPOSITION Right 05/13/2013   Procedure: RIGHT ULNAR NERVE  DECOMPRESSION/TRANSPOSITION;  Surgeon: [name]., MD;  Location: [place];  Service: Orthopedics;  Laterality: Right;    There were no vitals filed for this visit.   Subjective Assessment - 02/22/20 1321    Subjective Pt more interactive and joked during session    Patient is accompained by: Family member    Currently in Pain? No/denies            ADULT SLP TREATMENT - 02/22/20 1323      General Information   Behavior/Cognition Alert;Pleasant mood   overall was struggling   HPI Pt is a 74 y.o. 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         Treatment Provided   Treatment provided Cognitive-Linquistic      Pain Assessment     Pain Assessment No/denies pain      Cognitive-Linquistic Treatment   Treatment focused on Aphasia    Skilled Treatment NAMING CATEGORY MEMBERS: new vocabulary building task; Pt with intermittent ability to list 1 member, Total/Maximal assistance for stating 2 members; [place] by CATEGORIES: given object pictures and progressing to printed word only pt independently sorted into categories, pt able to state category of each pile.        Assessment / Recommendations / Plan   Plan Continue with current plan of care      Progression Toward Goals   Progression toward goals Progressing toward goals            SLP Education - 02/22/20 1324    Education Details utilizing categorizing to help with word finding problems    Person(s) Educated Patient;Spouse    Methods Explanation;Demonstration;Verbal cues    Comprehension Need further instruction              SLP Long Term Goals - 02/10/20 1435      SLP LONG TERM GOAL #1   Title Pt will utilize word retrieval strategies to communicate sentence level simple linguistic tasks with 75% accuracy.    Baseline 25%    Time 12    Period Weeks    Status Partially Met    Target Date 03/28/20      SLP LONG TERM GOAL #2   Title Pt will answer basic yes/no questions with 75% accuracy and Minimal cues.    Baseline 25%    Time 12    Period Weeks    Status Achieved    Target Date 03/28/20            Plan - 02/22/20 1326    Clinical Impression Statement Skilled treatment session targeted pt's expressive communication abilities. SLP facilitated session by providing novel activity of naming a member of described category. Pt had specific difficulty with providing items within categories that he felt "were useless." For example, pt views a lady's purse as useless, therefore he was not able to name any items such a keys, wallet etc. Sorting pictures/printed words into categories was easy for pt but understanding that each item belonged to a  specific category was not easy. Education provided on need to learn categories to aid in describing items during moments of word finding difficult.    Speech Therapy Frequency 2x / week    Duration --   12 weeks   Treatment/Interventions Patient/family education;Language facilitation    Potential to Achieve Goals Good  SLP Home Exercise Plan given - pt iwll categorize grocery store items    Consulted and Agree with Plan of Care Patient;Family member/caregiver    Family Member Consulted wife           Patient will benefit from skilled therapeutic intervention in order to improve the following deficits and impairments:   Aphasia    Problem List Patient Active Problem List   Diagnosis Date Noted  . Stroke ([place]) 12/07/2019  . CVA (cerebral vascular accident) ([place]) 12/06/2019  . Gait abnormality 11/12/2019  . Tremor, essential 11/12/2019  . Health care maintenance 10/04/2018  . Thumb pain 10/04/2018  . Memory change 12/17/2017  . Paresthesia 12/03/2017  . Lightheaded 08/28/2017  . Encounter for chronic pain management 06/18/2017  . Neck pain 05/14/2017  . Panic 01/10/2017  . Dysuria 03/29/2016  . Muscle ache 10/05/2015  . Syncope 07/08/2015  . Chronic back pain 04/12/2015  . Medicare annual wellness visit, initial 03/08/2015  . Advance care planning 03/08/2015  . Dysgeusia 11/28/2014  . Anxiety state 10/27/2014  . Diabetic polyneuropathy ([place]) 02/14/2014  . Rhinitis, chronic 01/13/2012  . Basal cell carcinoma   . LUMBAR RADICULOPATHY 10/16/2010  . HLD (hyperlipidemia) 05/13/2008  . Abdominal pain 08/11/2007  . Uncontrolled type 2 diabetes mellitus with peripheral neuropathy ([place]) 05/26/2007  . Essential hypertension 05/26/2007  . SCOLIOSIS 05/26/2007  . Tremor 05/26/2007  . COLONIC POLYPS, HX OF 05/26/2007   Audriana Aldama B. Rutherford Nail M.S., CCC-SLP, [place] Office [phone]   Stormy Fabian 02/22/2020, 1:28 PM  [place] MAIN [facility] SERVICES [address] [place], [place], [address] Phone: [phone]   Fax:  [phone]   Name: JAYD CADIEUX MRN: [id number] Date of Birth: 13-Jul-1946

## 2020-02-23 ENCOUNTER — Other Ambulatory Visit: Payer: Self-pay | Admitting: Family Medicine

## 2020-02-24 ENCOUNTER — Other Ambulatory Visit: Payer: Self-pay

## 2020-02-24 ENCOUNTER — Ambulatory Visit: Payer: PPO | Admitting: Speech Pathology

## 2020-02-24 ENCOUNTER — Encounter: Payer: Self-pay | Admitting: Speech Pathology

## 2020-02-24 DIAGNOSIS — R4701 Aphasia: Secondary | ICD-10-CM

## 2020-02-24 NOTE — Therapy (Signed)
[place] MAIN [facility] SERVICES [address] [place], [place], [address] Phone: [phone]   Fax:  [phone]  Speech Language Pathology Treatment  Patient Details  Name: Walter Harris MRN: [id number] Date of Birth: [birth year] Referring Provider (SLP): Elsie Stain   Encounter Date: 02/24/2020   End of Session - 02/24/20 1601    Visit Number 14    Number of Visits 25    Date for SLP Re-Evaluation 03/28/20    Authorization Type [employer]    Authorization Time Period 01/04/2020 - 03/28/2020    Authorization - Visit Number 4    Progress Note Due on Visit 10    SLP Start Time 1000    SLP Stop Time  1100    SLP Time Calculation (min) 60 min    Activity Tolerance Patient tolerated treatment well           Past Medical History:  Diagnosis Date  . Basal cell carcinoma    multiple  . Cataracts, both eyes 2019   surgery pending April 2020  . Complication of anesthesia    post-operative cognitive dysfunction after ACDF on 05/01/11 [facility])  . Deaf    right ear  . Diabetes mellitus   . DJD (degenerative joint disease)    lumbar spine  . Gait abnormality 11/12/2019  . Hearing impaired    hearing aids-transmitter rt   . History of blood transfusion    as a baby in 3  . History of colonic polyps   . Hyperlipidemia   . Hypertension    pt. denies high blood pressure  . Kidney stone 1967  . Memory change 12/17/2017  . Nose fracture    3x  . OCD (obsessive compulsive disorder)   . Stroke [facility])    "mini stroke, that's what gave me the tremors"  . Stuttering   . Tremor, essential   . Vertigo    random  . Wears dentures    full upper  . Wears glasses     Past Surgical History:  Procedure Laterality Date  . APPENDECTOMY  1960  . BACK SURGERY  2012   lumbar surgery  . CATARACT EXTRACTION W/PHACO Right 04/12/2019   Procedure: CATARACT EXTRACTION PHACO AND INTRAOCULAR LENS PLACEMENT (IOC) RIGHT, DIABETIC;  Surgeon: [name], MD;  Location: [place];  Service: Ophthalmology;  Laterality: Right;  Diabetic - insulin and oral meds  . CATARACT EXTRACTION W/PHACO Left 05/10/2019   Procedure: CATARACT EXTRACTION PHACO AND INTRAOCULAR LENS PLACEMENT (IOC)  LEFT DIABETIC  00:25.6  14.4%  3.75;  Surgeon: [name], MD;  Location: [place];  Service: Ophthalmology;  Laterality: Left;  Diabetic - insulin and oral meds  . CERVICAL FUSION  1990  . CERVICAL FUSION  8/12   multiple levels with plates  . CHOLESTEATOMA EXCISION  1993  . COLLATERAL LIGAMENT REPAIR, ELBOW     bilateral, nerve improvement left 08/04, right 09/04  . COLONOSCOPY    . CYST EXCISION     back of cervical spine total of 5 surgeries  . EXTERNAL EAR SURGERY     multiple ear surguries as a child  . OTHER SURGICAL HISTORY     benign head tumor#5  . OTHER SURGICAL HISTORY     sebaceous cysts-post neck x5  . SHOULDER ARTHROSCOPY W/ ACROMIAL REPAIR  08/2004   left shoulder  . TONSILLECTOMY    . ULNAR NERVE TRANSPOSITION Right 05/13/2013   Procedure: RIGHT ULNAR NERVE  DECOMPRESSION/TRANSPOSITION;  Surgeon: [name]., MD;  Location: [place];  Service: Orthopedics;  Laterality: Right;    There were no vitals filed for this visit.   Subjective Assessment - 02/24/20 1559    Subjective Pt more interactive and joked during session    Patient is accompained by: Family member    Currently in Pain? No/denies                 ADULT SLP TREATMENT - 02/24/20 0001      General Information   Behavior/Cognition Alert;Pleasant mood   overall was struggling   HPI Pt is a 74 y.o. 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         Treatment Provided   Treatment provided Cognitive-Linquistic      Pain  Assessment   Pain Assessment No/denies pain      Cognitive-Linquistic Treatment   Treatment focused on Aphasia    Skilled Treatment Minimal assistance for sorting printed words according to category, supervision level cues to identify name of category. Minimal assistance required to match description to word.        Assessment / Recommendations / Plan   Plan Continue with current plan of care      Progression Toward Goals   Progression toward goals Progressing toward goals            SLP Education - 02/24/20 1600    Education Details utilizing categories to help with word finding problems    Person(s) Educated Patient;Spouse    Methods Explanation;Demonstration;Verbal cues    Comprehension Need further instruction              SLP Long Term Goals - 02/10/20 1435      SLP [place] #1   Title Pt will utilize word retrieval strategies to communicate sentence level simple linguistic tasks with 75% accuracy.    Baseline 25%    Time 12    Period Weeks    Status Partially Met    Target Date 03/28/20      SLP LONG TERM GOAL #2   Title Pt will answer basic yes/no questions with 75% accuracy and Minimal cues.    Baseline 25%    Time 12    Period Weeks    Status Achieved    Target Date 03/28/20            Plan - 02/24/20 1601    Clinical Impression Statement Skilled treatment session targeted pt's ability to describe words/objects by category. Pt demonstrated increased understanding of using category descriptors during moments of word finding difficulties.    Speech Therapy Frequency 2x / week    Duration --   12 weeks   Treatment/Interventions Patient/family education;Language facilitation    Potential to Achieve Goals Good    SLP Home Exercise Plan given - matching word to basic description    Consulted and Agree with Plan of Care Patient;Family member/caregiver    Family Member Consulted wife           Patient will benefit from skilled therapeutic  intervention in order to improve the following deficits and impairments:   Aphasia    Problem List Patient Active Problem List   Diagnosis Date Noted  . Stroke ([place]) 12/07/2019  . CVA (cerebral vascular accident) ([place]) 12/06/2019  .  Gait abnormality 11/12/2019  . Tremor, essential 11/12/2019  . Health care maintenance 10/04/2018  . Thumb pain 10/04/2018  . Memory change 12/17/2017  . Paresthesia 12/03/2017  . Lightheaded 08/28/2017  . Encounter for chronic pain management 06/18/2017  . Neck pain 05/14/2017  . Panic 01/10/2017  . Dysuria 03/29/2016  . Muscle ache 10/05/2015  . Syncope 07/08/2015  . Chronic back pain 04/12/2015  . Medicare annual wellness visit, initial 03/08/2015  . Advance care planning 03/08/2015  . Dysgeusia 11/28/2014  . Anxiety state 10/27/2014  . Diabetic polyneuropathy ([place]) 02/14/2014  . Rhinitis, chronic 01/13/2012  . Basal cell carcinoma   . LUMBAR RADICULOPATHY 10/16/2010  . HLD (hyperlipidemia) 05/13/2008  . Abdominal pain 08/11/2007  . Uncontrolled type 2 diabetes mellitus with peripheral neuropathy ([place]) 05/26/2007  . Essential hypertension 05/26/2007  . SCOLIOSIS 05/26/2007  . Tremor 05/26/2007  . COLONIC POLYPS, HX OF 05/26/2007   Paitynn Mikus B. Rutherford Nail M.S., CCC-SLP, [place] Pathologist Rehabilitation Services Office [phone]  Stormy Fabian 02/24/2020, 4:03 PM  [place] MAIN [facility] SERVICES [address] [place], [place], [address] Phone: [phone]   Fax:  [phone]   Name: GEROME KOKESH MRN: [id number] Date of Birth: 1945/12/25

## 2020-02-24 NOTE — Telephone Encounter (Signed)
Electronic refill request. Alprazolam Last office visit:   10/05/2019 Last Filled:    60 tablet 1 12/19/2019  Please advise.

## 2020-02-25 NOTE — Telephone Encounter (Signed)
Sent in the meantime since I did not want him to run out.  Did he establish with Dr. Edwina Barth at [place] [facility]?  If so then we need to direct his refills and follow-up there.  Thanks.

## 2020-02-25 NOTE — Telephone Encounter (Signed)
Noted. Thanks.

## 2020-02-25 NOTE — Telephone Encounter (Signed)
Patient says he only sees Dr. Edwina Barth for his stroke medicines and intends to continue his care with Dr. [name].

## 2020-02-29 ENCOUNTER — Ambulatory Visit: Payer: PPO | Admitting: Speech Pathology

## 2020-02-29 ENCOUNTER — Other Ambulatory Visit: Payer: Self-pay

## 2020-02-29 ENCOUNTER — Encounter: Payer: Self-pay | Admitting: Speech Pathology

## 2020-02-29 DIAGNOSIS — R4701 Aphasia: Secondary | ICD-10-CM

## 2020-02-29 NOTE — Therapy (Signed)
[place] MAIN [facility] SERVICES [address] [place], [place], [address] Phone: [phone]   Fax:  [phone]  Speech Language Pathology Treatment  Patient Details  Name: Walter Harris MRN: [id number] Date of Birth: 02-22-1946 Referring Provider (SLP): Elsie Stain   Encounter Date: 02/29/2020   End of Session - 02/29/20 1433    Visit Number 15    Number of Visits 25    Date for SLP Re-Evaluation 03/28/20    Authorization Type [employer]    Authorization Time Period 01/04/2020 - 03/28/2020    Authorization - Visit Number 5    Progress Note Due on Visit 10    SLP Start Time 1000    SLP Stop Time  1100    SLP Time Calculation (min) 60 min    Activity Tolerance Patient tolerated treatment well           Past Medical History:  Diagnosis Date  . Basal cell carcinoma    multiple  . Cataracts, both eyes 2019   surgery pending April 2020  . Complication of anesthesia    post-operative cognitive dysfunction after ACDF on 05/01/11 [facility])  . Deaf    right ear  . Diabetes mellitus   . DJD (degenerative joint disease)    lumbar spine  . Gait abnormality 11/12/2019  . Hearing impaired    hearing aids-transmitter rt   . History of blood transfusion    as a baby in 21  . History of colonic polyps   . Hyperlipidemia   . Hypertension    pt. denies high blood pressure  . Kidney stone 1967  . Memory change 12/17/2017  . Nose fracture    3x  . OCD (obsessive compulsive disorder)   . Stroke [facility])    "mini stroke, that's what gave me the tremors"  . Stuttering   . Tremor, essential   . Vertigo    random  . Wears dentures    full upper  . Wears glasses     Past Surgical History:  Procedure Laterality Date  . APPENDECTOMY  1960  . BACK SURGERY  2012   lumbar surgery  . CATARACT EXTRACTION W/PHACO Right 04/12/2019   Procedure: CATARACT EXTRACTION PHACO AND INTRAOCULAR LENS PLACEMENT (IOC) RIGHT, DIABETIC;  Surgeon: [name], MD;  Location: [place];  Service: Ophthalmology;  Laterality: Right;  Diabetic - insulin and oral meds  . CATARACT EXTRACTION W/PHACO Left 05/10/2019   Procedure: CATARACT EXTRACTION PHACO AND INTRAOCULAR LENS PLACEMENT (IOC)  LEFT DIABETIC  00:25.6  14.4%  3.75;  Surgeon: [name], MD;  Location: [place];  Service: Ophthalmology;  Laterality: Left;  Diabetic - insulin and oral meds  . CERVICAL FUSION  1990  . CERVICAL FUSION  8/12   multiple levels with plates  . CHOLESTEATOMA EXCISION  1993  . COLLATERAL LIGAMENT REPAIR, ELBOW     bilateral, nerve improvement left 08/04, right 09/04  . COLONOSCOPY    . CYST EXCISION     back of cervical spine total of 5 surgeries  . EXTERNAL EAR SURGERY     multiple ear surguries as a child  . OTHER SURGICAL HISTORY     benign head tumor#5  . OTHER SURGICAL HISTORY     sebaceous cysts-post neck x5  . SHOULDER ARTHROSCOPY W/ ACROMIAL REPAIR  08/2004   left shoulder  . TONSILLECTOMY    . ULNAR NERVE TRANSPOSITION Right 05/13/2013   Procedure: RIGHT ULNAR NERVE  DECOMPRESSION/TRANSPOSITION;  Surgeon: [name]., MD;  Location: [place];  Service: Orthopedics;  Laterality: Right;    There were no vitals filed for this visit.   Subjective Assessment - 02/29/20 1432    Subjective pt stated that he didn''t "feel well" but was ablet o fully participate and make progress towards his goals    Patient is accompained by: Family member    Currently in Pain? No/denies                 ADULT SLP TREATMENT - 02/29/20 0001      General Information   Behavior/Cognition Alert;Pleasant mood   overall was struggling   HPI Pt is a 74 y.o. 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         Treatment  Provided   Treatment provided Cognitive-Linquistic      Pain Assessment   Pain Assessment No/denies pain      Cognitive-Linquistic Treatment   Treatment focused on Aphasia    Skilled Treatment Generative naming: independent with listing >6 words per basic to semi-complex category x 6 categories. Mild to Moderate assistance required for abstract generative naming (s-words; f-words), Independent generation of word based on description/definition, introduced semantic description during moments of word finding difficulty, 6 to 7 length spontaneous conversation without word finding deficits.       Assessment / Recommendations / Plan   Plan Continue with current plan of care      Progression Toward Goals   Progression toward goals Progressing toward goals            SLP Education - 02/29/20 1432    Education Details utilizing object description in moments of word finding deficits    Person(s) Educated Patient;Spouse    Methods Explanation;Demonstration;Verbal cues    Comprehension Need further instruction              SLP Long Term Goals - 02/10/20 1435      SLP [place] #1   Title Pt will utilize word retrieval strategies to communicate sentence level simple linguistic tasks with 75% accuracy.    Baseline 25%    Time 12    Period Weeks    Status Partially Met    Target Date 03/28/20      SLP LONG TERM GOAL #2   Title Pt will answer basic yes/no questions with 75% accuracy and Minimal cues.    Baseline 25%    Time 12    Period Weeks    Status Achieved    Target Date 03/28/20            Plan - 02/29/20 1433    Clinical Impression Statement Skilled treatment session focused on pt's progress towards his communication goals. Pt has progressed from complete inability to generate words within a category to independently generating words in those original categories. Additionally, with extensive assistance he was unable to generate more complex words but currently he  requires mild to moderate assistance. His spoke in 6 to 7 sentence length conversation during this session that was free of word finding deficits. Skilled ST services continue to be required to develop strategies for pt to use during moments of word finding difficulty.    Speech Therapy Frequency 2x / week    Duration --   12 weeks  Treatment/Interventions Patient/family education;Language facilitation    Potential to Achieve Goals Good    SLP Home Exercise Plan given - matching word to basic description    Consulted and Agree with Plan of Care Patient;Family member/caregiver    Family Member Consulted wife           Patient will benefit from skilled therapeutic intervention in order to improve the following deficits and impairments:   Aphasia    Problem List Patient Active Problem List   Diagnosis Date Noted  . Stroke ([place]) 12/07/2019  . CVA (cerebral vascular accident) ([place]) 12/06/2019  . Gait abnormality 11/12/2019  . Tremor, essential 11/12/2019  . Health care maintenance 10/04/2018  . Thumb pain 10/04/2018  . Memory change 12/17/2017  . Paresthesia 12/03/2017  . Lightheaded 08/28/2017  . Encounter for chronic pain management 06/18/2017  . Neck pain 05/14/2017  . Panic 01/10/2017  . Dysuria 03/29/2016  . Muscle ache 10/05/2015  . Syncope 07/08/2015  . Chronic back pain 04/12/2015  . Medicare annual wellness visit, initial 03/08/2015  . Advance care planning 03/08/2015  . Dysgeusia 11/28/2014  . Anxiety state 10/27/2014  . Diabetic polyneuropathy ([place]) 02/14/2014  . Rhinitis, chronic 01/13/2012  . Basal cell carcinoma   . LUMBAR RADICULOPATHY 10/16/2010  . HLD (hyperlipidemia) 05/13/2008  . Abdominal pain 08/11/2007  . Uncontrolled type 2 diabetes mellitus with peripheral neuropathy ([place]) 05/26/2007  . Essential hypertension 05/26/2007  . SCOLIOSIS 05/26/2007  . Tremor 05/26/2007  . COLONIC POLYPS, HX OF 05/26/2007   Quandre Polinski B. Rutherford Nail M.S., CCC-SLP,  [place] Pathologist Rehabilitation Services Office [phone]   Stormy Fabian 02/29/2020, 2:34 PM  [place] MAIN [facility] SERVICES [address] [place], [place], [address] Phone: [phone]   Fax:  [phone]   Name: Walter Harris MRN: [id number] Date of Birth: Oct 22, 1945

## 2020-03-02 ENCOUNTER — Encounter: Payer: PPO | Admitting: Speech Pathology

## 2020-03-07 ENCOUNTER — Ambulatory Visit: Payer: PPO | Admitting: Speech Pathology

## 2020-03-07 ENCOUNTER — Other Ambulatory Visit: Payer: Self-pay | Admitting: Family Medicine

## 2020-03-07 NOTE — Telephone Encounter (Signed)
Name of Medication: oxycodone apap 5-325 mg Name of [facility]  Last Fill or Written Date and Quantity: # 180 on 01/27/20 Last Office Visit and Type: 10/05/19 annual Next Office Visit and Type: none scheduled Last Controlled Substance Agreement Date: 02/28/2015 Last UDS:03/14/2016

## 2020-03-07 NOTE — Telephone Encounter (Signed)
Patient's wife called requesting a refill  oxyCODONE-acetaminophen (PERCOCET/ROXICET) 5-325 MG tablet    Stated patient has about 5 or 6 left    [facility] [place]

## 2020-03-08 MED ORDER — OXYCODONE-ACETAMINOPHEN 5-325 MG PO TABS: ORAL_TABLET | ORAL | 0 refills | Status: DC

## 2020-03-08 NOTE — Telephone Encounter (Signed)
Sent. Thanks.   

## 2020-03-09 ENCOUNTER — Other Ambulatory Visit: Payer: Self-pay

## 2020-03-09 ENCOUNTER — Encounter: Payer: Self-pay | Admitting: Speech Pathology

## 2020-03-09 ENCOUNTER — Ambulatory Visit: Payer: PPO | Attending: Family Medicine | Admitting: Speech Pathology

## 2020-03-09 DIAGNOSIS — R4701 Aphasia: Secondary | ICD-10-CM | POA: Diagnosis not present

## 2020-03-09 NOTE — Therapy (Signed)
[place] MAIN [facility] SERVICES [address] [place], [place], [address] Phone: [phone]   Fax:  [phone]  Speech Language Pathology Treatment  Patient Details  Name: Walter Harris MRN: [id number] Date of Birth: 1946/03/18 Referring Provider (SLP): Elsie Stain   Encounter Date: 03/09/2020   End of Session - 03/09/20 1558    Visit Number 16    Number of Visits 25    Date for SLP Re-Evaluation 03/28/20    Authorization Type [employer]    Authorization Time Period 01/04/2020 - 03/28/2020    Authorization - Visit Number 6    Progress Note Due on Visit 10    SLP Start Time 1000    SLP Stop Time  1100    SLP Time Calculation (min) 60 min    Activity Tolerance Patient tolerated treatment well           Past Medical History:  Diagnosis Date  . Basal cell carcinoma    multiple  . Cataracts, both eyes 2019   surgery pending April 2020  . Complication of anesthesia    post-operative cognitive dysfunction after ACDF on 05/01/11 [facility])  . Deaf    right ear  . Diabetes mellitus   . DJD (degenerative joint disease)    lumbar spine  . Gait abnormality 11/12/2019  . Hearing impaired    hearing aids-transmitter rt   . History of blood transfusion    as a baby in 69  . History of colonic polyps   . Hyperlipidemia   . Hypertension    pt. denies high blood pressure  . Kidney stone 1967  . Memory change 12/17/2017  . Nose fracture    3x  . OCD (obsessive compulsive disorder)   . Stroke [facility])    "mini stroke, that's what gave me the tremors"  . Stuttering   . Tremor, essential   . Vertigo    random  . Wears dentures    full upper  . Wears glasses     Past Surgical History:  Procedure Laterality Date  . APPENDECTOMY  1960  . BACK SURGERY  2012   lumbar surgery  . CATARACT EXTRACTION W/PHACO Right 04/12/2019   Procedure: CATARACT EXTRACTION PHACO AND INTRAOCULAR LENS PLACEMENT (IOC) RIGHT, DIABETIC;  Surgeon: [name], MD;  Location: [place];  Service: Ophthalmology;  Laterality: Right;  Diabetic - insulin and oral meds  . CATARACT EXTRACTION W/PHACO Left 05/10/2019   Procedure: CATARACT EXTRACTION PHACO AND INTRAOCULAR LENS PLACEMENT (IOC)  LEFT DIABETIC  00:25.6  14.4%  3.75;  Surgeon: [name], MD;  Location: [place];  Service: Ophthalmology;  Laterality: Left;  Diabetic - insulin and oral meds  . CERVICAL FUSION  1990  . CERVICAL FUSION  8/12   multiple levels with plates  . CHOLESTEATOMA EXCISION  1993  . COLLATERAL LIGAMENT REPAIR, ELBOW     bilateral, nerve improvement left 08/04, right 09/04  . COLONOSCOPY    . CYST EXCISION     back of cervical spine total of 5 surgeries  . EXTERNAL EAR SURGERY     multiple ear surguries as a child  . OTHER SURGICAL HISTORY     benign head tumor#5  . OTHER SURGICAL HISTORY     sebaceous cysts-post neck x5  . SHOULDER ARTHROSCOPY W/ ACROMIAL REPAIR  08/2004   left shoulder  . TONSILLECTOMY    . ULNAR NERVE TRANSPOSITION Right 05/13/2013   Procedure: RIGHT ULNAR NERVE  DECOMPRESSION/TRANSPOSITION;  Surgeon: [name]., MD;  Location: [place];  Service: Orthopedics;  Laterality: Right;    There were no vitals filed for this visit.   Subjective Assessment - 03/09/20 1557    Subjective pt plesant, appeared in good spirits    Patient is accompained by: Family member    Currently in Pain? No/denies                 ADULT SLP TREATMENT - 03/09/20 0001      General Information   Behavior/Cognition Alert;Pleasant mood   overall was struggling   HPI Pt is a 74 y.o. 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         Treatment Provided   Treatment provided Cognitive-Linquistic      Pain  Assessment   Pain Assessment No/denies pain      Cognitive-Linquistic Treatment   Treatment focused on Aphasia    Skilled Treatment Pt independent with naming objects from descriptions. Moderate multi-modal cues to describe given word with enough detail for listener to state object name       Assessment / Recommendations / Plan   Plan Continue with current plan of care      Progression Toward Goals   Progression toward goals Progressing toward goals            SLP Education - 03/09/20 1558    Education Details object descriptions    Person(s) Educated Patient;Spouse    Methods Explanation;Demonstration;Verbal cues;Handout    Comprehension Need further instruction              SLP Long Term Goals - 02/10/20 1435      SLP LONG TERM GOAL #1   Title Pt will utilize word retrieval strategies to communicate sentence level simple linguistic tasks with 75% accuracy.    Baseline 25%    Time 12    Period Weeks    Status Partially Met    Target Date 03/28/20      SLP LONG TERM GOAL #2   Title Pt will answer basic yes/no questions with 75% accuracy and Minimal cues.    Baseline 25%    Time 12    Period Weeks    Status Achieved    Target Date 03/28/20            Plan - 03/09/20 1559    Clinical Impression Statement Skilled treatment session targeted pt's communication goals. SLP facilitated session by providing task for naming objects from descriptions. Pt completely independent with task and was able to generate > 3 items based on description. SLP further facilitated session by providing object name and moderate cues to generate description of object. This was more difficult for pt. He tends to include non-specific descriptions rather than cues targeting object function. At the end of session, pt with increased ability to identify when his description was not detailed enough.    Speech Therapy Frequency 2x / week    Duration --   12 weeks   Treatment/Interventions  Patient/family education;Language facilitation    Potential to Achieve Goals Good    SLP Home Exercise Plan given - naming objects from descriptions    Consulted and Agree with Plan of Care Patient;Family member/caregiver    Family Member Consulted wife  Patient will benefit from skilled therapeutic intervention in order to improve the following deficits and impairments:   Aphasia    Problem List Patient Active Problem List   Diagnosis Date Noted  . Stroke ([place]) 12/07/2019  . CVA (cerebral vascular accident) ([place]) 12/06/2019  . Gait abnormality 11/12/2019  . Tremor, essential 11/12/2019  . Health care maintenance 10/04/2018  . Thumb pain 10/04/2018  . Memory change 12/17/2017  . Paresthesia 12/03/2017  . Lightheaded 08/28/2017  . Encounter for chronic pain management 06/18/2017  . Neck pain 05/14/2017  . Panic 01/10/2017  . Dysuria 03/29/2016  . Muscle ache 10/05/2015  . Syncope 07/08/2015  . Chronic back pain 04/12/2015  . Medicare annual wellness visit, initial 03/08/2015  . Advance care planning 03/08/2015  . Dysgeusia 11/28/2014  . Anxiety state 10/27/2014  . Diabetic polyneuropathy ([place]) 02/14/2014  . Rhinitis, chronic 01/13/2012  . Basal cell carcinoma   . LUMBAR RADICULOPATHY 10/16/2010  . HLD (hyperlipidemia) 05/13/2008  . Abdominal pain 08/11/2007  . Uncontrolled type 2 diabetes mellitus with peripheral neuropathy ([place]) 05/26/2007  . Essential hypertension 05/26/2007  . SCOLIOSIS 05/26/2007  . Tremor 05/26/2007  . COLONIC POLYPS, HX OF 05/26/2007   Lubna Stegeman B. Rutherford Nail M.S., CCC-SLP, [place] Office [phone]  Stormy Fabian 03/09/2020, 4:00 PM  [place] MAIN [facility] SERVICES [address] [place], [place], [address] Phone: [phone]   Fax:  [phone]   Name: Walter Harris MRN: [id number] Date of Birth: 11/11/1945

## 2020-03-14 ENCOUNTER — Other Ambulatory Visit: Payer: Self-pay

## 2020-03-14 ENCOUNTER — Ambulatory Visit: Payer: PPO | Admitting: Speech Pathology

## 2020-03-14 DIAGNOSIS — R4701 Aphasia: Secondary | ICD-10-CM | POA: Diagnosis not present

## 2020-03-15 NOTE — Therapy (Signed)
[place] MAIN [facility] SERVICES [address] [place], [place], [address] Phone: [phone]   Fax:  [phone]  Speech Language Pathology Treatment  Patient Details  Name: Walter Harris MRN: [id number] Date of Birth: July 09, 1946 Referring Provider (SLP): Elsie Stain   Encounter Date: 03/14/2020   End of Session - 03/15/20 1714    Visit Number 17    Number of Visits 25    Date for SLP Re-Evaluation 03/28/20    Authorization Type [employer]    Authorization Time Period 01/04/2020 - 03/28/2020    Authorization - Visit Number 7    Progress Note Due on Visit 10    SLP Start Time 1400    SLP Stop Time  1500    SLP Time Calculation (min) 60 min    Activity Tolerance Patient tolerated treatment well           Past Medical History:  Diagnosis Date  . Basal cell carcinoma    multiple  . Cataracts, both eyes 2019   surgery pending April 2020  . Complication of anesthesia    post-operative cognitive dysfunction after ACDF on 05/01/11 [facility])  . Deaf    right ear  . Diabetes mellitus   . DJD (degenerative joint disease)    lumbar spine  . Gait abnormality 11/12/2019  . Hearing impaired    hearing aids-transmitter rt   . History of blood transfusion    as a baby in 39  . History of colonic polyps   . Hyperlipidemia   . Hypertension    pt. denies high blood pressure  . Kidney stone 1967  . Memory change 12/17/2017  . Nose fracture    3x  . OCD (obsessive compulsive disorder)   . Stroke [facility])    "mini stroke, that's what gave me the tremors"  . Stuttering   . Tremor, essential   . Vertigo    random  . Wears dentures    full upper  . Wears glasses     Past Surgical History:  Procedure Laterality Date  . APPENDECTOMY  1960  . BACK SURGERY  2012   lumbar surgery  . CATARACT EXTRACTION W/PHACO Right 04/12/2019   Procedure: CATARACT EXTRACTION PHACO AND INTRAOCULAR LENS PLACEMENT (IOC) RIGHT, DIABETIC;  Surgeon: [name], MD;  Location: [place];  Service: Ophthalmology;  Laterality: Right;  Diabetic - insulin and oral meds  . CATARACT EXTRACTION W/PHACO Left 05/10/2019   Procedure: CATARACT EXTRACTION PHACO AND INTRAOCULAR LENS PLACEMENT (IOC)  LEFT DIABETIC  00:25.6  14.4%  3.75;  Surgeon: [name], MD;  Location: [place];  Service: Ophthalmology;  Laterality: Left;  Diabetic - insulin and oral meds  . CERVICAL FUSION  1990  . CERVICAL FUSION  8/12   multiple levels with plates  . CHOLESTEATOMA EXCISION  1993  . COLLATERAL LIGAMENT REPAIR, ELBOW     bilateral, nerve improvement left 08/04, right 09/04  . COLONOSCOPY    . CYST EXCISION     back of cervical spine total of 5 surgeries  . EXTERNAL EAR SURGERY     multiple ear surguries as a child  . OTHER SURGICAL HISTORY     benign head tumor#5  . OTHER SURGICAL HISTORY     sebaceous cysts-post neck x5  . SHOULDER ARTHROSCOPY W/ ACROMIAL REPAIR  08/2004   left shoulder  . TONSILLECTOMY    . ULNAR NERVE TRANSPOSITION Right 05/13/2013   Procedure: RIGHT ULNAR NERVE  DECOMPRESSION/TRANSPOSITION;  Surgeon: [name]., MD;  Location: [place];  Service: Orthopedics;  Laterality: Right;    There were no vitals filed for this visit.   Subjective Assessment - 03/15/20 1712    Subjective pt pleasant, states he is tired of struggling to say stuff    Patient is accompained by: Family member    Currently in Pain? No/denies                 ADULT SLP TREATMENT - 03/15/20 0001      General Information   Behavior/Cognition Alert;Pleasant mood   overall was struggling   HPI Pt is a 74 y.o. 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         Treatment Provided   Treatment provided Cognitive-Linquistic       Pain Assessment   Pain Assessment No/denies pain      Cognitive-Linquistic Treatment   Treatment focused on Aphasia    Skilled Treatment Minimal assistance for object description for previously described objects, moderate assistance required to complete paragraph with word bank.        Assessment / Recommendations / Plan   Plan Continue with current plan of care      Progression Toward Goals   Progression toward goals Progressing toward goals            SLP Education - 03/15/20 1713    Education Details completing paragraph with alternate words    Person(s) Educated Patient;Spouse    Methods Explanation;Demonstration;Verbal cues    Comprehension Verbalized understanding              SLP Long Term Goals - 02/10/20 1435      SLP LONG TERM GOAL #1   Title Pt will utilize word retrieval strategies to communicate sentence level simple linguistic tasks with 75% accuracy.    Baseline 25%    Time 12    Period Weeks    Status Partially Met    Target Date 03/28/20      SLP LONG TERM GOAL #2   Title Pt will answer basic yes/no questions with 75% accuracy and Minimal cues.    Baseline 25%    Time 12    Period Weeks    Status Achieved    Target Date 03/28/20            Plan - 03/15/20 1714    Clinical Impression Statement Skilled treatment session focused on pt's expressive language goals. SLP facilitated increasing pt's expressive language by providing moderate cues to choose appropriate word from bank to complete sentences within short paragraphs (4 to 5 sentences). Pt with decreased flexibility with word use as he desires to use only the word he generates. This leads to increased communication breakdown at home as he stops communicating during moments of word finding difficulty rather than attempting alternate words or descriptions. Pt with increased ability to describe common objects and was instructed to "continue talking" during moments of word finding difficulty  rather than discontinuing communication. After completing paragraphs, pt read full paragraphs with great accuracy, fluency, and wonderful use of prosody.    Speech Therapy Frequency 2x / week    Duration --   12 weeks   Treatment/Interventions Patient/family education;Language facilitation    Potential to Achieve Goals Good    SLP Home Exercise  Plan given - read and create 2 more paragraphs    Consulted and Agree with Plan of Care Patient;Family member/caregiver    Family Member Consulted wife           Patient will benefit from skilled therapeutic intervention in order to improve the following deficits and impairments:   Aphasia    Problem List Patient Active Problem List   Diagnosis Date Noted  . Stroke ([place]) 12/07/2019  . CVA (cerebral vascular accident) ([place]) 12/06/2019  . Gait abnormality 11/12/2019  . Tremor, essential 11/12/2019  . Health care maintenance 10/04/2018  . Thumb pain 10/04/2018  . Memory change 12/17/2017  . Paresthesia 12/03/2017  . Lightheaded 08/28/2017  . Encounter for chronic pain management 06/18/2017  . Neck pain 05/14/2017  . Panic 01/10/2017  . Dysuria 03/29/2016  . Muscle ache 10/05/2015  . Syncope 07/08/2015  . Chronic back pain 04/12/2015  . Medicare annual wellness visit, initial 03/08/2015  . Advance care planning 03/08/2015  . Dysgeusia 11/28/2014  . Anxiety state 10/27/2014  . Diabetic polyneuropathy ([place]) 02/14/2014  . Rhinitis, chronic 01/13/2012  . Basal cell carcinoma   . LUMBAR RADICULOPATHY 10/16/2010  . HLD (hyperlipidemia) 05/13/2008  . Abdominal pain 08/11/2007  . Uncontrolled type 2 diabetes mellitus with peripheral neuropathy ([place]) 05/26/2007  . Essential hypertension 05/26/2007  . SCOLIOSIS 05/26/2007  . Tremor 05/26/2007  . COLONIC POLYPS, HX OF 05/26/2007   Ghada Abbett B. Rutherford Nail M.S., CCC-SLP, [place] Pathologist Rehabilitation Services Office [phone]  Stormy Fabian 03/15/2020, 5:16 PM  [place] MAIN [facility] SERVICES [address] [place], [place], [address] Phone: [phone]   Fax:  [phone]   Name: Walter Harris MRN: [id number] Date of Birth: 10-16-1945

## 2020-03-16 ENCOUNTER — Encounter: Payer: PPO | Admitting: Speech Pathology

## 2020-03-20 ENCOUNTER — Ambulatory Visit: Payer: PPO | Admitting: Speech Pathology

## 2020-03-22 ENCOUNTER — Ambulatory Visit: Payer: PPO | Admitting: Speech Pathology

## 2020-03-22 ENCOUNTER — Other Ambulatory Visit: Payer: Self-pay

## 2020-03-22 DIAGNOSIS — R4701 Aphasia: Secondary | ICD-10-CM | POA: Diagnosis not present

## 2020-03-23 NOTE — Therapy (Signed)
[place] MAIN [facility] SERVICES [address] [place], [place], [address] Phone: [phone]   Fax:  [phone]  Speech Language Pathology Treatment  Patient Details  Name: Walter Harris MRN: [id number] Date of Birth: [birth year] Referring Provider (SLP): Elsie Stain   Encounter Date: 03/22/2020   End of Session - 03/23/20 0816    Visit Number 18    Number of Visits 25    Date for SLP Re-Evaluation 03/28/20    Authorization Type [employer]    Authorization Time Period 01/04/2020 - 03/28/2020    Authorization - Visit Number 8    Progress Note Due on Visit 10    SLP Start Time 1100    SLP Stop Time  1200    SLP Time Calculation (min) 60 min    Activity Tolerance Patient tolerated treatment well           Past Medical History:  Diagnosis Date  . Basal cell carcinoma    multiple  . Cataracts, both eyes 2019   surgery pending April 2020  . Complication of anesthesia    post-operative cognitive dysfunction after ACDF on 05/01/11 [facility])  . Deaf    right ear  . Diabetes mellitus   . DJD (degenerative joint disease)    lumbar spine  . Gait abnormality 11/12/2019  . Hearing impaired    hearing aids-transmitter rt   . History of blood transfusion    as a baby in 77  . History of colonic polyps   . Hyperlipidemia   . Hypertension    pt. denies high blood pressure  . Kidney stone 1967  . Memory change 12/17/2017  . Nose fracture    3x  . OCD (obsessive compulsive disorder)   . Stroke [facility])    "mini stroke, that's what gave me the tremors"  . Stuttering   . Tremor, essential   . Vertigo    random  . Wears dentures    full upper  . Wears glasses     Past Surgical History:  Procedure Laterality Date  . APPENDECTOMY  1960  . BACK SURGERY  2012   lumbar surgery  . CATARACT EXTRACTION W/PHACO Right 04/12/2019   Procedure: CATARACT EXTRACTION PHACO AND INTRAOCULAR LENS PLACEMENT (IOC) RIGHT, DIABETIC;  Surgeon: [name], MD;  Location: [place];  Service: Ophthalmology;  Laterality: Right;  Diabetic - insulin and oral meds  . CATARACT EXTRACTION W/PHACO Left 05/10/2019   Procedure: CATARACT EXTRACTION PHACO AND INTRAOCULAR LENS PLACEMENT (IOC)  LEFT DIABETIC  00:25.6  14.4%  3.75;  Surgeon: [name], MD;  Location: [place];  Service: Ophthalmology;  Laterality: Left;  Diabetic - insulin and oral meds  . CERVICAL FUSION  1990  . CERVICAL FUSION  8/12   multiple levels with plates  . CHOLESTEATOMA EXCISION  1993  . COLLATERAL LIGAMENT REPAIR, ELBOW     bilateral, nerve improvement left 08/04, right 09/04  . COLONOSCOPY    . CYST EXCISION     back of cervical spine total of 5 surgeries  . EXTERNAL EAR SURGERY     multiple ear surguries as a child  . OTHER SURGICAL HISTORY     benign head tumor#5  . OTHER SURGICAL HISTORY     sebaceous cysts-post neck x5  . SHOULDER ARTHROSCOPY W/ ACROMIAL REPAIR  08/2004   left shoulder  . TONSILLECTOMY    . ULNAR NERVE TRANSPOSITION Right 05/13/2013   Procedure: RIGHT ULNAR NERVE  DECOMPRESSION/TRANSPOSITION;  Surgeon: [name]., MD;  Location: [place];  Service: Orthopedics;  Laterality: Right;    There were no vitals filed for this visit.   Subjective Assessment - 03/23/20 0814    Subjective pt pleasant, he feels encouraged as he reports having several "good days"    Patient is accompained by: Family member    Currently in Pain? No/denies                 ADULT SLP TREATMENT - 03/23/20 0001      General Information   Behavior/Cognition Alert;Pleasant mood   overall was struggling   HPI Pt is a 74 y.o. 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         Treatment Provided   Treatment provided  Cognitive-Linquistic      Pain Assessment   Pain Assessment No/denies pain      Cognitive-Linquistic Treatment   Treatment focused on Aphasia    Skilled Treatment Generative naming: improvement noted, lists increased from 6-7 to 9-10 items (with moderate faded to minimal cues); supervision cues to complete paragraph with words from word blank       Assessment / Recommendations / [place] with current plan of care      Progression Toward Goals   Progression toward goals Progressing toward goals            SLP Education - 03/23/20 0816    Education Details progress    Person(s) Educated Patient;Spouse    Methods Explanation;Demonstration;Verbal cues    Comprehension Verbalized understanding              SLP Long Term Goals - 02/10/20 1435      SLP LONG TERM GOAL #1   Title Pt will utilize word retrieval strategies to communicate sentence level simple linguistic tasks with 75% accuracy.    Baseline 25%    Time 12    Period Weeks    Status Partially Met    Target Date 03/28/20      SLP LONG TERM GOAL #2   Title Pt will answer basic yes/no questions with 75% accuracy and Minimal cues.    Baseline 25%    Time 12    Period Weeks    Status Achieved    Target Date 03/28/20            Plan - 03/23/20 0818    Clinical Impression Statement Skilled treatment session targeted pt's expressive communication goals. SLP facilitated session by providing moderate faded to minimal cues for generative naming tasks. Pt with improved ability to name more items with decreased level of cues. Pt also demonstrated improved ability to utilize word bank to complete blanks within paragraph. Additionally, he was able to self-generate words that appropriately completed different paragraphs. His wife also stated that pt has been attempting to talk thru moments of word finding difficulty rather than giving up.    Speech Therapy Frequency 2x / week    Duration --   12 weeks    Treatment/Interventions Patient/family education;Language facilitation    Potential to Achieve Goals Good    SLP Home Exercise Plan given - read and create 2 more paragraphs    Consulted and Agree with Plan of Care Patient;Family member/caregiver    Family Member Consulted wife  Patient will benefit from skilled therapeutic intervention in order to improve the following deficits and impairments:   Aphasia    Problem List Patient Active Problem List   Diagnosis Date Noted  . Stroke ([place]) 12/07/2019  . CVA (cerebral vascular accident) ([place]) 12/06/2019  . Gait abnormality 11/12/2019  . Tremor, essential 11/12/2019  . Health care maintenance 10/04/2018  . Thumb pain 10/04/2018  . Memory change 12/17/2017  . Paresthesia 12/03/2017  . Lightheaded 08/28/2017  . Encounter for chronic pain management 06/18/2017  . Neck pain 05/14/2017  . Panic 01/10/2017  . Dysuria 03/29/2016  . Muscle ache 10/05/2015  . Syncope 07/08/2015  . Chronic back pain 04/12/2015  . Medicare annual wellness visit, initial 03/08/2015  . Advance care planning 03/08/2015  . Dysgeusia 11/28/2014  . Anxiety state 10/27/2014  . Diabetic polyneuropathy ([place]) 02/14/2014  . Rhinitis, chronic 01/13/2012  . Basal cell carcinoma   . LUMBAR RADICULOPATHY 10/16/2010  . HLD (hyperlipidemia) 05/13/2008  . Abdominal pain 08/11/2007  . Uncontrolled type 2 diabetes mellitus with peripheral neuropathy ([place]) 05/26/2007  . Essential hypertension 05/26/2007  . SCOLIOSIS 05/26/2007  . Tremor 05/26/2007  . COLONIC POLYPS, HX OF 05/26/2007   Quamere Mussell B. Rutherford Nail M.S., CCC-SLP, [place] Pathologist Rehabilitation Services Office [phone]  Stormy Fabian 03/23/2020, 8:18 AM  [place] MAIN [facility] SERVICES [address] [place], [place], [address] Phone: [phone]   Fax:  [phone]   Name: Walter Harris MRN: [id number] Date of Birth: [birth year]

## 2020-03-27 ENCOUNTER — Other Ambulatory Visit: Payer: Self-pay | Admitting: Family Medicine

## 2020-03-27 NOTE — Telephone Encounter (Signed)
Electronic refill request. Propranolol Last office visit:   10/05/2019 Last Filled:     90 capsule 0 12/28/2019  Please advise.

## 2020-03-28 ENCOUNTER — Other Ambulatory Visit: Payer: Self-pay

## 2020-03-28 ENCOUNTER — Ambulatory Visit: Payer: PPO | Admitting: Speech Pathology

## 2020-03-28 DIAGNOSIS — R4701 Aphasia: Secondary | ICD-10-CM

## 2020-03-28 NOTE — Telephone Encounter (Signed)
Sent. Thanks.   

## 2020-03-29 NOTE — Therapy (Signed)
[place] MAIN [facility] SERVICES [address] [place], [place], [address] Phone: [phone]   Fax:  [phone]  Speech Language Pathology Treatment RE-CERTIFICATION  Patient Details  Name: Walter Harris MRN: [id number] Date of Birth: [birth year] Referring Provider (SLP): Walter Harris   Encounter Date: 03/28/2020   End of Session - 03/29/20 0932    Visit Number 19    Number of Visits 25    Date for SLP Re-Evaluation 06/20/20    Authorization Type [employer]    Authorization Time Period 03/28/2020 - 06/20/20    Authorization - Visit Number 9    Progress Note Due on Visit 10    Activity Tolerance Patient tolerated treatment well           Past Medical History:  Diagnosis Date  . Basal cell carcinoma    multiple  . Cataracts, both eyes 2019   surgery pending April 2020  . Complication of anesthesia    post-operative cognitive dysfunction after ACDF on 05/01/11 [facility])  . Deaf    right ear  . Diabetes mellitus   . DJD (degenerative joint disease)    lumbar spine  . Gait abnormality 11/12/2019  . Hearing impaired    hearing aids-transmitter rt   . History of blood transfusion    as a baby in 34  . History of colonic polyps   . Hyperlipidemia   . Hypertension    pt. denies high blood pressure  . Kidney stone 1967  . Memory change 12/17/2017  . Nose fracture    3x  . OCD (obsessive compulsive disorder)   . Stroke [facility])    "mini stroke, that's what gave me the tremors"  . Stuttering   . Tremor, essential   . Vertigo    random  . Wears dentures    full upper  . Wears glasses     Past Surgical History:  Procedure Laterality Date  . APPENDECTOMY  1960  . BACK SURGERY  2012   lumbar surgery  . CATARACT EXTRACTION W/PHACO Right 04/12/2019   Procedure: CATARACT EXTRACTION PHACO AND INTRAOCULAR LENS PLACEMENT (IOC) RIGHT, DIABETIC;  Surgeon: [name], MD;  Location: [place];  Service:  Ophthalmology;  Laterality: Right;  Diabetic - insulin and oral meds  . CATARACT EXTRACTION W/PHACO Left 05/10/2019   Procedure: CATARACT EXTRACTION PHACO AND INTRAOCULAR LENS PLACEMENT (IOC)  LEFT DIABETIC  00:25.6  14.4%  3.75;  Surgeon: [name], MD;  Location: [place];  Service: Ophthalmology;  Laterality: Left;  Diabetic - insulin and oral meds  . CERVICAL FUSION  1990  . CERVICAL FUSION  8/12   multiple levels with plates  . CHOLESTEATOMA EXCISION  1993  . COLLATERAL LIGAMENT REPAIR, ELBOW     bilateral, nerve improvement left 08/04, right 09/04  . COLONOSCOPY    . CYST EXCISION     back of cervical spine total of 5 surgeries  . EXTERNAL EAR SURGERY     multiple ear surguries as a child  . OTHER SURGICAL HISTORY     benign head tumor#5  . OTHER SURGICAL HISTORY     sebaceous cysts-post neck x5  . SHOULDER ARTHROSCOPY W/ ACROMIAL REPAIR  08/2004   left shoulder  . TONSILLECTOMY    . ULNAR NERVE TRANSPOSITION Right 05/13/2013   Procedure: RIGHT ULNAR NERVE DECOMPRESSION/TRANSPOSITION;  Surgeon: [name]., MD;  Location: [place];  Service: Orthopedics;  Laterality: Right;  There were no vitals filed for this visit.   Subjective Assessment - 03/28/20 1721    Subjective pt pleasant, he feels encouraged as he reports having several "good days"    Patient is accompained by: Family member    Currently in Pain? No/denies            ADULT SLP TREATMENT - 03/29/20 0001      General Information   Behavior/Cognition Alert;Pleasant mood   overall was struggling   HPI Pt is a 74 y.o. 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         Treatment Provided   Treatment provided Cognitive-Linquistic      Pain Assessment   Pain Assessment No/denies  pain      Cognitive-Linquistic Treatment   Treatment focused on Aphasia    Skilled Treatment Minimal assistance to fill in the blank words that fit meaning of sentences within multiple paragraphs taken from [place] [facility].      Assessment / Recommendations / Plan   Plan Continue with current plan of care      Progression Toward Goals   Progression toward goals Progressing toward goals            SLP Education - 03/29/20 0927    Education Details progress towards goals, rationale for utilizing reading to improve word finding skills    Person(s) Educated Patient;Spouse    Methods Explanation;Demonstration;Verbal cues    Comprehension Verbalized understanding              SLP Long Term Goals - 03/28/20 1726      SLP LONG TERM GOAL #1   Title Pt will utilize word retrieval strategies to communicate sentence level simple linguistic tasks with 75% accuracy.    Baseline 25%    Time 12    Period Weeks    Status On-going      SLP LONG TERM GOAL #2   Title Pt will answer basic yes/no questions with 75% accuracy and Minimal cues.    Baseline 25%    Status Achieved            Plan - 03/29/20 0928    Clinical Impression Statement Skilled treatment session targeted pt's expressive communication goals. SLP facilitated session by providing high interest reading material to elicit word finding. Pt enjoyed reading [employer] and is currently unable to read them d/t high vocabulary. SLP provided simplified version of chapter 1 with fill in the blank places for increased word finding. He was able to read passage and provide appropriate word for meaning with 93% accuracy.   [place]   Pt has eagerly participated in skilled ST sessions and as a result he has made progress by meeting 1 of 2 LTGs. Over the course of the 12 weeks, he has progressed from maximal assistance for extremely basic naming tasks to moderate  assistance with generative naming tasks. He is now able to provide at least 5 words per category. Additionally, he ability to answer yes/no questions has progressed and pt is completely independent and accurate with his responses.  We have also targeted semantic feature analysis to help him describe objects during moments of word finding difficulty. Pt has made minimal progress with descriptive features. Reading continues to be a strength and is helpful in eliciting  words. Despite great progress with basic naming tasks, his overall speech continues to be non-fluent with maximal to moderate expressive aphasia that prevents him from communicating wants/needs. Skilled ST continues to be medically necessary and will request re-certification for another 12 weeks.       Speech Therapy Frequency 2x / week    Duration --   12 weeks   Treatment/Interventions Patient/family education;Language facilitation    Potential to Achieve Goals Good    SLP Home Exercise Plan given - read and create 2 more paragraphs    Consulted and Agree with Plan of Care Patient;Family member/caregiver    Family Member Consulted wife           Patient will benefit from skilled therapeutic intervention in order to improve the following deficits and impairments:   Aphasia - Plan: SLP plan of care cert/re-cert   Problem List Patient Active Problem List   Diagnosis Date Noted  . Stroke ([place]) 12/07/2019  . CVA (cerebral vascular accident) ([place]) 12/06/2019  . Gait abnormality 11/12/2019  . Tremor, essential 11/12/2019  . Health care maintenance 10/04/2018  . Thumb pain 10/04/2018  . Memory change 12/17/2017  . Paresthesia 12/03/2017  . Lightheaded 08/28/2017  . Encounter for chronic pain management 06/18/2017  . Neck pain 05/14/2017  . Panic 01/10/2017  . Dysuria 03/29/2016  . Muscle ache 10/05/2015  . Syncope 07/08/2015  . Chronic back pain 04/12/2015  . Medicare annual wellness visit, initial 03/08/2015  . Advance  care planning 03/08/2015  . Dysgeusia 11/28/2014  . Anxiety state 10/27/2014  . Diabetic polyneuropathy ([place]) 02/14/2014  . Rhinitis, chronic 01/13/2012  . Basal cell carcinoma   . LUMBAR RADICULOPATHY 10/16/2010  . HLD (hyperlipidemia) 05/13/2008  . Abdominal pain 08/11/2007  . Uncontrolled type 2 diabetes mellitus with peripheral neuropathy ([place]) 05/26/2007  . Essential hypertension 05/26/2007  . SCOLIOSIS 05/26/2007  . Tremor 05/26/2007  . COLONIC POLYPS, HX OF 05/26/2007   Sonnet Rizor B. Rutherford Nail M.S., CCC-SLP, [place] Pathologist Rehabilitation Services Office [phone]  Stormy Fabian 03/29/2020, 9:32 AM  [place] MAIN [facility] SERVICES [address] [place], [place], [address] Phone: [phone]   Fax:  [phone]   Name: Walter Harris MRN: [id number] Date of Birth: 11-23-1945

## 2020-03-29 NOTE — Progress Notes (Signed)
Agree.  Thanks.     Physician Documentation  Your signature is required to indicate approval of the treatment plan as stated above. By signing this report, you are approving the plan of care. Please sign and either send electronically or print and fax the signed copy to the number below. If you approve with modifications, please indicate those in the space provided._______________________  Physician Signature: ___Graham Duncan___  Date:__07/21/21_____ Time:_11:25 AM ____

## 2020-03-30 ENCOUNTER — Ambulatory Visit: Payer: PPO | Admitting: Speech Pathology

## 2020-03-30 ENCOUNTER — Other Ambulatory Visit: Payer: Self-pay

## 2020-03-30 DIAGNOSIS — Z789 Other specified health status: Secondary | ICD-10-CM | POA: Diagnosis not present

## 2020-03-30 DIAGNOSIS — Z794 Long term (current) use of insulin: Secondary | ICD-10-CM | POA: Diagnosis not present

## 2020-03-30 DIAGNOSIS — E1165 Type 2 diabetes mellitus with hyperglycemia: Secondary | ICD-10-CM | POA: Diagnosis not present

## 2020-03-30 DIAGNOSIS — F320 Major depressive disorder, single episode, mild: Secondary | ICD-10-CM | POA: Diagnosis not present

## 2020-03-30 DIAGNOSIS — I10 Essential (primary) hypertension: Secondary | ICD-10-CM | POA: Diagnosis not present

## 2020-03-30 DIAGNOSIS — R4701 Aphasia: Secondary | ICD-10-CM

## 2020-03-30 DIAGNOSIS — E1169 Type 2 diabetes mellitus with other specified complication: Secondary | ICD-10-CM | POA: Diagnosis not present

## 2020-03-30 DIAGNOSIS — E785 Hyperlipidemia, unspecified: Secondary | ICD-10-CM | POA: Diagnosis not present

## 2020-03-30 DIAGNOSIS — F5101 Primary insomnia: Secondary | ICD-10-CM | POA: Diagnosis not present

## 2020-03-30 DIAGNOSIS — R251 Tremor, unspecified: Secondary | ICD-10-CM | POA: Diagnosis not present

## 2020-03-30 LAB — HEMOGLOBIN A1C: Hemoglobin A1C: 9

## 2020-03-30 NOTE — Therapy (Addendum)
[place] MAIN [facility] SERVICES [address] [place], [place], [address] Phone: [phone]   Fax:  [phone]  Speech Language Pathology Treatment  Speech Therapy Progress Note   Dates of reporting period  02/10/2020   to   03/30/2020  Patient Details  Name: Walter Harris MRN: [id number] Date of Birth: [birth year] Referring Provider (SLP): Elsie Stain   Encounter Date: 03/30/2020   End of Session - 03/30/20 1748    Visit Number 20    Number of Visits 43   Date for SLP Re-Evaluation 06/20/20    Authorization Type [employer]    Authorization Time Period 03/28/2020 - 06/20/20    Authorization - Visit Number 10    Progress Note Due on Visit 10    SLP Start Time 1000    SLP Stop Time  1100    SLP Time Calculation (min) 60 min    Activity Tolerance Patient tolerated treatment well           Past Medical History:  Diagnosis Date  . Basal cell carcinoma    multiple  . Cataracts, both eyes 2019   surgery pending April 2020  . Complication of anesthesia    post-operative cognitive dysfunction after ACDF on 05/01/11 [facility])  . Deaf    right ear  . Diabetes mellitus   . DJD (degenerative joint disease)    lumbar spine  . Gait abnormality 11/12/2019  . Hearing impaired    hearing aids-transmitter rt   . History of blood transfusion    as a baby in 89  . History of colonic polyps   . Hyperlipidemia   . Hypertension    pt. denies high blood pressure  . Kidney stone 1967  . Memory change 12/17/2017  . Nose fracture    3x  . OCD (obsessive compulsive disorder)   . Stroke [facility])    "mini stroke, that's what gave me the tremors"  . Stuttering   . Tremor, essential   . Vertigo    random  . Wears dentures    full upper  . Wears glasses     Past Surgical History:  Procedure Laterality Date  . APPENDECTOMY  1960  . BACK SURGERY  2012   lumbar surgery  . CATARACT EXTRACTION W/PHACO Right 04/12/2019   Procedure:  CATARACT EXTRACTION PHACO AND INTRAOCULAR LENS PLACEMENT (IOC) RIGHT, DIABETIC;  Surgeon: [name], MD;  Location: [place];  Service: Ophthalmology;  Laterality: Right;  Diabetic - insulin and oral meds  . CATARACT EXTRACTION W/PHACO Left 05/10/2019   Procedure: CATARACT EXTRACTION PHACO AND INTRAOCULAR LENS PLACEMENT (IOC)  LEFT DIABETIC  00:25.6  14.4%  3.75;  Surgeon: [name], MD;  Location: [place];  Service: Ophthalmology;  Laterality: Left;  Diabetic - insulin and oral meds  . CERVICAL FUSION  1990  . CERVICAL FUSION  8/12   multiple levels with plates  . CHOLESTEATOMA EXCISION  1993  . COLLATERAL LIGAMENT REPAIR, ELBOW     bilateral, nerve improvement left 08/04, right 09/04  . COLONOSCOPY    . CYST EXCISION     back of cervical spine total of 5 surgeries  . EXTERNAL EAR SURGERY     multiple ear surguries as a child  . OTHER SURGICAL HISTORY     benign head tumor#5  . OTHER SURGICAL HISTORY     sebaceous cysts-post neck x5  . SHOULDER ARTHROSCOPY W/ ACROMIAL REPAIR  08/2004   left shoulder  .  TONSILLECTOMY    . ULNAR NERVE TRANSPOSITION Right 05/13/2013   Procedure: RIGHT ULNAR NERVE DECOMPRESSION/TRANSPOSITION;  Surgeon: [name]., MD;  Location: [place];  Service: Orthopedics;  Laterality: Right;    There were no vitals filed for this visit.   Subjective Assessment - 03/30/20 1746    Subjective pt pleasant, he feels encouraged as he reports having several "good days"    Patient is accompained by: Family member    Currently in Pain? No/denies                 ADULT SLP TREATMENT - 03/30/20 0001      General Information   Behavior/Cognition Alert;Pleasant mood   overall was struggling   HPI Pt is a 74 y.o. 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         Treatment Provided   Treatment provided Cognitive-Linquistic      Pain Assessment   Pain Assessment No/denies pain      Cognitive-Linquistic Treatment   Treatment focused on Aphasia    Skilled Treatment Skilled treatment session targeted pt's word finding difficulty by utilizing reading a high interest novel. SLP provided more complex version of chapter 3 with fill in the blank places for increased word finding. He had more difficulty generating appropriate words d/t increased complexity of language, therefore he benefited from moderate cues. Additionally, he was not able to independent provide any information about previous chapters. However he increased his ability when leading questions provided.        Assessment / Recommendations / Plan   Plan Continue with current plan of care      Progression Toward Goals   Progression toward goals Progressing toward goals            SLP Education - 03/30/20 1747    Education Details level of cues to elicit language    Person(s) Educated Patient;Spouse    Methods Explanation;Demonstration;Verbal cues    Comprehension Verbalized understanding              SLP Long Term Goals - 03/28/20 1726      SLP LONG TERM GOAL #1   Title Pt will utilize word retrieval strategies to communicate sentence level simple linguistic tasks with 75% accuracy.    Baseline 25%    Time 12    Period Weeks    Status On-going      SLP LONG TERM GOAL #2   Title Pt will answer basic yes/no questions with 75% accuracy and Minimal cues.    Baseline 25%    Status Achieved            Plan - 03/30/20 1748    Clinical Impression Statement Skilled treatment session targeted pt's word finding difficulty by utilizing reading a high interest novel. SLP provided more complex version of chapter 3 with fill in the blank places for increased word finding. He had more difficulty generating appropriate words d/t increased  complexity of language, therefore he benefited from moderate cues. Additionally, he was not able to independent provide any information about previous chapters. However he increased his ability when leading questions provided.    Speech Therapy Frequency 2x / week    Duration --   12 weeks   Treatment/Interventions Patient/family education;Language facilitation    Potential to Achieve Goals Good  SLP Home Exercise Plan given    Consulted and Agree with Plan of Care Patient;Family member/caregiver    Family Member Consulted wife           Patient will benefit from skilled therapeutic intervention in order to improve the following deficits and impairments:   Aphasia    Problem List Patient Active Problem List   Diagnosis Date Noted  . Stroke ([place]) 12/07/2019  . CVA (cerebral vascular accident) ([place]) 12/06/2019  . Gait abnormality 11/12/2019  . Tremor, essential 11/12/2019  . Health care maintenance 10/04/2018  . Thumb pain 10/04/2018  . Memory change 12/17/2017  . Paresthesia 12/03/2017  . Lightheaded 08/28/2017  . Encounter for chronic pain management 06/18/2017  . Neck pain 05/14/2017  . Panic 01/10/2017  . Dysuria 03/29/2016  . Muscle ache 10/05/2015  . Syncope 07/08/2015  . Chronic back pain 04/12/2015  . Medicare annual wellness visit, initial 03/08/2015  . Advance care planning 03/08/2015  . Dysgeusia 11/28/2014  . Anxiety state 10/27/2014  . Diabetic polyneuropathy ([place]) 02/14/2014  . Rhinitis, chronic 01/13/2012  . Basal cell carcinoma   . LUMBAR RADICULOPATHY 10/16/2010  . HLD (hyperlipidemia) 05/13/2008  . Abdominal pain 08/11/2007  . Uncontrolled type 2 diabetes mellitus with peripheral neuropathy ([place]) 05/26/2007  . Essential hypertension 05/26/2007  . SCOLIOSIS 05/26/2007  . Tremor 05/26/2007  . COLONIC POLYPS, HX OF 05/26/2007   Cris Talavera B. Rutherford Nail M.S., CCC-SLP, [place] Pathologist Rehabilitation Services Office [phone]  Stormy Fabian 03/30/2020, 5:49 PM  [place] MAIN [facility] SERVICES [address] [place], [place], [address] Phone: [phone]   Fax:  [phone]   Name: Walter Harris MRN: [id number] Date of Birth: 05-06-1946

## 2020-04-04 ENCOUNTER — Ambulatory Visit: Payer: PPO | Admitting: Speech Pathology

## 2020-04-04 ENCOUNTER — Other Ambulatory Visit: Payer: Self-pay

## 2020-04-04 DIAGNOSIS — R4701 Aphasia: Secondary | ICD-10-CM

## 2020-04-05 NOTE — Therapy (Signed)
[place] MAIN [facility] SERVICES [address] [place], [place], [address] Phone: [phone]   Fax:  [phone]  Speech Language Pathology Treatment  Patient Details  Name: Walter Harris MRN: [id number] Date of Birth: 19-Jan-1946 Referring Provider (SLP): Elsie Stain   Encounter Date: 04/04/2020   End of Session - 04/05/20 0639    Visit Number 21    Number of Visits 43    Date for SLP Re-Evaluation 06/20/20    Authorization Type [employer]    Authorization Time Period 03/28/2020 - 06/20/20    Authorization - Visit Number 1    Progress Note Due on Visit 10    SLP Start Time 1000    SLP Stop Time  1100    SLP Time Calculation (min) 60 min    Activity Tolerance Patient tolerated treatment well           Past Medical History:  Diagnosis Date  . Basal cell carcinoma    multiple  . Cataracts, both eyes 2019   surgery pending April 2020  . Complication of anesthesia    post-operative cognitive dysfunction after ACDF on 05/01/11 [facility])  . Deaf    right ear  . Diabetes mellitus   . DJD (degenerative joint disease)    lumbar spine  . Gait abnormality 11/12/2019  . Hearing impaired    hearing aids-transmitter rt   . History of blood transfusion    as a baby in 64  . History of colonic polyps   . Hyperlipidemia   . Hypertension    pt. denies high blood pressure  . Kidney stone 1967  . Memory change 12/17/2017  . Nose fracture    3x  . OCD (obsessive compulsive disorder)   . Stroke [facility])    "mini stroke, that's what gave me the tremors"  . Stuttering   . Tremor, essential   . Vertigo    random  . Wears dentures    full upper  . Wears glasses     Past Surgical History:  Procedure Laterality Date  . APPENDECTOMY  1960  . BACK SURGERY  2012   lumbar surgery  . CATARACT EXTRACTION W/PHACO Right 04/12/2019   Procedure: CATARACT EXTRACTION PHACO AND INTRAOCULAR LENS PLACEMENT (IOC) RIGHT, DIABETIC;  Surgeon: [name], MD;  Location: [place];  Service: Ophthalmology;  Laterality: Right;  Diabetic - insulin and oral meds  . CATARACT EXTRACTION W/PHACO Left 05/10/2019   Procedure: CATARACT EXTRACTION PHACO AND INTRAOCULAR LENS PLACEMENT (IOC)  LEFT DIABETIC  00:25.6  14.4%  3.75;  Surgeon: [name], MD;  Location: [place];  Service: Ophthalmology;  Laterality: Left;  Diabetic - insulin and oral meds  . CERVICAL FUSION  1990  . CERVICAL FUSION  8/12   multiple levels with plates  . CHOLESTEATOMA EXCISION  1993  . COLLATERAL LIGAMENT REPAIR, ELBOW     bilateral, nerve improvement left 08/04, right 09/04  . COLONOSCOPY    . CYST EXCISION     back of cervical spine total of 5 surgeries  . EXTERNAL EAR SURGERY     multiple ear surguries as a child  . OTHER SURGICAL HISTORY     benign head tumor#5  . OTHER SURGICAL HISTORY     sebaceous cysts-post neck x5  . SHOULDER ARTHROSCOPY W/ ACROMIAL REPAIR  08/2004   left shoulder  . TONSILLECTOMY    . ULNAR NERVE TRANSPOSITION Right 05/13/2013   Procedure: RIGHT ULNAR NERVE  DECOMPRESSION/TRANSPOSITION;  Surgeon: [name]., MD;  Location: [place];  Service: Orthopedics;  Laterality: Right;    There were no vitals filed for this visit.   Subjective Assessment - 04/05/20 0637    Subjective pt pleasant, he feels encouraged as he reports having several "good days"    Patient is accompained by: Family member    Currently in Pain? No/denies                 ADULT SLP TREATMENT - 04/05/20 0001      General Information   Behavior/Cognition Alert;Pleasant mood   overall was struggling   HPI Pt is a 74 y.o. 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         Treatment Provided   Treatment provided  Cognitive-Linquistic      Pain Assessment   Pain Assessment No/denies pain      Cognitive-Linquistic Treatment   Treatment focused on Aphasia    Skilled Treatment Pt able to complete 70% of fill-in-the-blank sentences independently. Moderate to maximum cues required for remaining sentences. Maximum cues required to use context of entire sentence to aid in word finding.        Assessment / Recommendations / Plan   Plan Continue with current plan of care      Progression Toward Goals   Progression toward goals Progressing toward goals            SLP Education - 04/05/20 [phone]    Education Details strategies to elicit expressive language    Person(s) Educated Patient;Spouse    Methods Explanation;Demonstration;Verbal cues;Handout    Comprehension Need further instruction              SLP Long Term Goals - 03/28/20 1726      SLP LONG TERM GOAL #1   Title Pt will utilize word retrieval strategies to communicate sentence level simple linguistic tasks with 75% accuracy.    Baseline 25%    Time 12    Period Weeks    Status On-going      SLP LONG TERM GOAL #2   Title Pt will answer basic yes/no questions with 75% accuracy and Minimal cues.    Baseline 25%    Status Achieved            Plan - 04/05/20 0644    Clinical Impression Statement Skilled treatment session targeted pt's word finding goals. SLP facilitated session by providing fill-in-the-blank sentences to build word finding ability. Pt required moderate to maximal cues to utilize context of sentence to aid in word retrieval. He continued to repeat parts of sentence without gaining the full understanding of entire sentence. Specifically, pt had more difficulty generating descriptive adjectives and less difficulty with nouns. SLP further facilitated spoken language but providing maximal sentence completion to provide information about story content.    Speech Therapy Frequency 2x / week    Duration --   12 weeks    Treatment/Interventions Patient/family education;Language facilitation    Potential to Achieve Goals Good    SLP Home Exercise Plan given    Consulted and Agree with Plan of Care Patient;Family member/caregiver    Family Member Consulted wife           Patient will benefit from skilled therapeutic intervention in order to improve the following deficits  and impairments:   Aphasia    Problem List Patient Active Problem List   Diagnosis Date Noted  . Stroke ([place]) 12/07/2019  . CVA (cerebral vascular accident) ([place]) 12/06/2019  . Gait abnormality 11/12/2019  . Tremor, essential 11/12/2019  . Health care maintenance 10/04/2018  . Thumb pain 10/04/2018  . Memory change 12/17/2017  . Paresthesia 12/03/2017  . Lightheaded 08/28/2017  . Encounter for chronic pain management 06/18/2017  . Neck pain 05/14/2017  . Panic 01/10/2017  . Dysuria 03/29/2016  . Muscle ache 10/05/2015  . Syncope 07/08/2015  . Chronic back pain 04/12/2015  . Medicare annual wellness visit, initial 03/08/2015  . Advance care planning 03/08/2015  . Dysgeusia 11/28/2014  . Anxiety state 10/27/2014  . Diabetic polyneuropathy ([place]) 02/14/2014  . Rhinitis, chronic 01/13/2012  . Basal cell carcinoma   . LUMBAR RADICULOPATHY 10/16/2010  . HLD (hyperlipidemia) 05/13/2008  . Abdominal pain 08/11/2007  . Uncontrolled type 2 diabetes mellitus with peripheral neuropathy ([place]) 05/26/2007  . Essential hypertension 05/26/2007  . SCOLIOSIS 05/26/2007  . Tremor 05/26/2007  . COLONIC POLYPS, HX OF 05/26/2007   Bracken Moffa B. Rutherford Nail M.S., CCC-SLP, [place] Pathologist Rehabilitation Services Office [phone]  Stormy Fabian 04/05/2020, 6:45 AM  [place] MAIN [facility] SERVICES [address] [place], [place], [address] Phone: [phone]   Fax:  [phone]   Name: Walter Harris MRN: [id number] Date of Birth: 1946/03/27

## 2020-04-06 ENCOUNTER — Other Ambulatory Visit: Payer: Self-pay

## 2020-04-06 ENCOUNTER — Ambulatory Visit: Payer: PPO | Admitting: Speech Pathology

## 2020-04-06 DIAGNOSIS — R4701 Aphasia: Secondary | ICD-10-CM

## 2020-04-06 NOTE — Therapy (Signed)
[place] MAIN [facility] SERVICES [address] [place], [place], [address] Phone: [phone]   Fax:  [phone]  Speech Language Pathology Treatment  Patient Details  Name: Walter Harris MRN: [id number] Date of Birth: 12/29/1945 Referring Provider (SLP): Elsie Stain   Encounter Date: 04/06/2020   End of Session - 04/06/20 1343    Visit Number 22    Number of Visits 43    Date for SLP Re-Evaluation 06/20/20    Authorization Type [employer]    Authorization Time Period 03/28/2020 - 06/20/20    Authorization - Visit Number 2    Progress Note Due on Visit 10    SLP Start Time 1000    SLP Stop Time  1100    SLP Time Calculation (min) 60 min    Activity Tolerance Patient tolerated treatment well           Past Medical History:  Diagnosis Date  . Basal cell carcinoma    multiple  . Cataracts, both eyes 2019   surgery pending April 2020  . Complication of anesthesia    post-operative cognitive dysfunction after ACDF on 05/01/11 [facility])  . Deaf    right ear  . Diabetes mellitus   . DJD (degenerative joint disease)    lumbar spine  . Gait abnormality 11/12/2019  . Hearing impaired    hearing aids-transmitter rt   . History of blood transfusion    as a baby in 12  . History of colonic polyps   . Hyperlipidemia   . Hypertension    pt. denies high blood pressure  . Kidney stone 1967  . Memory change 12/17/2017  . Nose fracture    3x  . OCD (obsessive compulsive disorder)   . Stroke [facility])    "mini stroke, that's what gave me the tremors"  . Stuttering   . Tremor, essential   . Vertigo    random  . Wears dentures    full upper  . Wears glasses     Past Surgical History:  Procedure Laterality Date  . APPENDECTOMY  1960  . BACK SURGERY  2012   lumbar surgery  . CATARACT EXTRACTION W/PHACO Right 04/12/2019   Procedure: CATARACT EXTRACTION PHACO AND INTRAOCULAR LENS PLACEMENT (IOC) RIGHT, DIABETIC;  Surgeon: [name], MD;  Location: [place];  Service: Ophthalmology;  Laterality: Right;  Diabetic - insulin and oral meds  . CATARACT EXTRACTION W/PHACO Left 05/10/2019   Procedure: CATARACT EXTRACTION PHACO AND INTRAOCULAR LENS PLACEMENT (IOC)  LEFT DIABETIC  00:25.6  14.4%  3.75;  Surgeon: [name], MD;  Location: [place];  Service: Ophthalmology;  Laterality: Left;  Diabetic - insulin and oral meds  . CERVICAL FUSION  1990  . CERVICAL FUSION  8/12   multiple levels with plates  . CHOLESTEATOMA EXCISION  1993  . COLLATERAL LIGAMENT REPAIR, ELBOW     bilateral, nerve improvement left 08/04, right 09/04  . COLONOSCOPY    . CYST EXCISION     back of cervical spine total of 5 surgeries  . EXTERNAL EAR SURGERY     multiple ear surguries as a child  . OTHER SURGICAL HISTORY     benign head tumor#5  . OTHER SURGICAL HISTORY     sebaceous cysts-post neck x5  . SHOULDER ARTHROSCOPY W/ ACROMIAL REPAIR  08/2004   left shoulder  . TONSILLECTOMY    . ULNAR NERVE TRANSPOSITION Right 05/13/2013   Procedure: RIGHT ULNAR NERVE  DECOMPRESSION/TRANSPOSITION;  Surgeon: [name]., MD;  Location: [place];  Service: Orthopedics;  Laterality: Right;    There were no vitals filed for this visit.   Subjective Assessment - 04/06/20 1342    Subjective pt feels frustrated with slowness of progress    Patient is accompained by: Family member    Currently in Pain? No/denies                 ADULT SLP TREATMENT - 04/06/20 0001      General Information   Behavior/Cognition Alert;Pleasant mood   overall was struggling   HPI Pt is a 74 y.o. 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         Treatment Provided   Treatment provided Cognitive-Linquistic      Pain  Assessment   Pain Assessment No/denies pain      Cognitive-Linquistic Treatment   Treatment focused on Aphasia    Skilled Treatment Moderate assistance to formulate chapter summary using cohesive sentences.        Assessment / Recommendations / Plan   Plan Continue with current plan of care      Progression Toward Goals   Progression toward goals Progressing toward goals            SLP Education - 04/06/20 1343    Education Details strategies to elicit expressive language    Person(s) Educated Patient;Spouse    Methods Explanation;Demonstration;Verbal cues;Handout    Comprehension Need further instruction              SLP Long Term Goals - 03/28/20 1726      SLP LONG TERM GOAL #1   Title Pt will utilize word retrieval strategies to communicate sentence level simple linguistic tasks with 75% accuracy.    Baseline 25%    Time 12    Period Weeks    Status On-going      SLP LONG TERM GOAL #2   Title Pt will answer basic yes/no questions with 75% accuracy and Minimal cues.    Baseline 25%    Status Achieved            Plan - 04/06/20 1344    Clinical Impression Statement Skilled treatment session targeted pt's expressive language goals. SLP facilitated session by having pt read 1 paragraph from chapter 1 of [facility]. Pt's expression while reading is no longer present. Suspect this might be d/t length of sentences in text. With moderate assistance pt able to state a cohesive sentence about the paragraph. While this strategy is mildly effective, pt lacks the overall expressive ability with information he has read. Will try summary paragraphs for each chapter with [facility] questions to promote increased word finding.    Speech Therapy Frequency 2x / week    Duration --   12 weeks   Treatment/Interventions Patient/family education;Language facilitation    Potential to Achieve Goals Good    SLP Home Exercise Plan given    Consulted and Agree with Plan of Care  Patient;Family member/caregiver    Family Member Consulted wife           Patient will benefit from skilled therapeutic intervention in order to improve the following deficits and impairments:   Aphasia    Problem List Patient Active Problem List   Diagnosis Date  Noted  . Stroke ([place]) 12/07/2019  . CVA (cerebral vascular accident) ([place]) 12/06/2019  . Gait abnormality 11/12/2019  . Tremor, essential 11/12/2019  . Health care maintenance 10/04/2018  . Thumb pain 10/04/2018  . Memory change 12/17/2017  . Paresthesia 12/03/2017  . Lightheaded 08/28/2017  . Encounter for chronic pain management 06/18/2017  . Neck pain 05/14/2017  . Panic 01/10/2017  . Dysuria 03/29/2016  . Muscle ache 10/05/2015  . Syncope 07/08/2015  . Chronic back pain 04/12/2015  . Medicare annual wellness visit, initial 03/08/2015  . Advance care planning 03/08/2015  . Dysgeusia 11/28/2014  . Anxiety state 10/27/2014  . Diabetic polyneuropathy ([place]) 02/14/2014  . Rhinitis, chronic 01/13/2012  . Basal cell carcinoma   . LUMBAR RADICULOPATHY 10/16/2010  . HLD (hyperlipidemia) 05/13/2008  . Abdominal pain 08/11/2007  . Uncontrolled type 2 diabetes mellitus with peripheral neuropathy ([place]) 05/26/2007  . Essential hypertension 05/26/2007  . SCOLIOSIS 05/26/2007  . Tremor 05/26/2007  . COLONIC POLYPS, HX OF 05/26/2007   Serafina Topham B. Rutherford Nail M.S., CCC-SLP, [facility] Speech-Language Pathologist Rehabilitation Services Office [phone]  Stormy Fabian 04/06/2020, 1:44 PM  [place] MAIN [facility] SERVICES [address] [place], [place], [address] Phone: [phone]   Fax:  [phone]   Name: Walter Harris MRN: [id number] Date of Birth: [birth year]

## 2020-04-10 ENCOUNTER — Other Ambulatory Visit: Payer: Self-pay

## 2020-04-10 NOTE — Telephone Encounter (Signed)
Patient contacted the office and states that he needs a refill of his Oxycodone. This was last refilled 03/08/20 for #180 with 0 refills. Patient was last seen 10/05/19 and has no upcoming appts. Is this ok to refill?

## 2020-04-11 ENCOUNTER — Other Ambulatory Visit: Payer: Self-pay

## 2020-04-11 ENCOUNTER — Ambulatory Visit: Payer: PPO | Attending: Family Medicine | Admitting: Speech Pathology

## 2020-04-11 ENCOUNTER — Encounter: Payer: Self-pay | Admitting: Speech Pathology

## 2020-04-11 DIAGNOSIS — R4701 Aphasia: Secondary | ICD-10-CM | POA: Insufficient documentation

## 2020-04-11 MED ORDER — OXYCODONE-ACETAMINOPHEN 5-325 MG PO TABS: ORAL_TABLET | ORAL | 0 refills | Status: DC

## 2020-04-11 NOTE — Telephone Encounter (Signed)
Letter mailed

## 2020-04-11 NOTE — Telephone Encounter (Signed)
Okay to fill.  Sent.  Needs OV when possible in the next month, okay to do by phone if needed.

## 2020-04-11 NOTE — Therapy (Signed)
[place] MAIN [facility] SERVICES [address] [place], [place], [address] Phone: [phone]   Fax:  [phone]  Speech Language Pathology Treatment  Patient Details  Name: Walter Harris MRN: [id number] Date of Birth: [birth year] Referring Provider (SLP): Elsie Stain   Encounter Date: 04/11/2020   End of Session - 04/11/20 1820    Visit Number 23    Number of Visits 43    Date for SLP Re-Evaluation 06/20/20    Authorization Type [employer]    Authorization Time Period 03/28/2020 - 06/20/20    Authorization - Visit Number 3    Progress Note Due on Visit 10    SLP Start Time 1100    SLP Stop Time  1200    SLP Time Calculation (min) 60 min    Activity Tolerance Patient tolerated treatment well           Past Medical History:  Diagnosis Date  . Basal cell carcinoma    multiple  . Cataracts, both eyes 2019   surgery pending April 2020  . Complication of anesthesia    post-operative cognitive dysfunction after ACDF on 05/01/11 [facility])  . Deaf    right ear  . Diabetes mellitus   . DJD (degenerative joint disease)    lumbar spine  . Gait abnormality 11/12/2019  . Hearing impaired    hearing aids-transmitter rt   . History of blood transfusion    as a baby in 72  . History of colonic polyps   . Hyperlipidemia   . Hypertension    pt. denies high blood pressure  . Kidney stone 1967  . Memory change 12/17/2017  . Nose fracture    3x  . OCD (obsessive compulsive disorder)   . Stroke [facility])    "mini stroke, that's what gave me the tremors"  . Stuttering   . Tremor, essential   . Vertigo    random  . Wears dentures    full upper  . Wears glasses     Past Surgical History:  Procedure Laterality Date  . APPENDECTOMY  1960  . BACK SURGERY  2012   lumbar surgery  . CATARACT EXTRACTION W/PHACO Right 04/12/2019   Procedure: CATARACT EXTRACTION PHACO AND INTRAOCULAR LENS PLACEMENT (IOC) RIGHT, DIABETIC;  Surgeon: [name], MD;  Location: [place];  Service: Ophthalmology;  Laterality: Right;  Diabetic - insulin and oral meds  . CATARACT EXTRACTION W/PHACO Left 05/10/2019   Procedure: CATARACT EXTRACTION PHACO AND INTRAOCULAR LENS PLACEMENT (IOC)  LEFT DIABETIC  00:25.6  14.4%  3.75;  Surgeon: [name], MD;  Location: [place];  Service: Ophthalmology;  Laterality: Left;  Diabetic - insulin and oral meds  . CERVICAL FUSION  1990  . CERVICAL FUSION  8/12   multiple levels with plates  . CHOLESTEATOMA EXCISION  1993  . COLLATERAL LIGAMENT REPAIR, ELBOW     bilateral, nerve improvement left 08/04, right 09/04  . COLONOSCOPY    . CYST EXCISION     back of cervical spine total of 5 surgeries  . EXTERNAL EAR SURGERY     multiple ear surguries as a child  . OTHER SURGICAL HISTORY     benign head tumor#5  . OTHER SURGICAL HISTORY     sebaceous cysts-post neck x5  . SHOULDER ARTHROSCOPY W/ ACROMIAL REPAIR  08/2004   left shoulder  . TONSILLECTOMY    . ULNAR NERVE TRANSPOSITION Right 05/13/2013   Procedure: RIGHT ULNAR NERVE  DECOMPRESSION/TRANSPOSITION;  Surgeon: [name]., MD;  Location: [place];  Service: Orthopedics;  Laterality: Right;    There were no vitals filed for this visit.   Subjective Assessment - 04/11/20 1819    Subjective pt appeared happy over his recent effort to read independently at home    Patient is accompained by: Family member    Currently in Pain? No/denies                 ADULT SLP TREATMENT - 04/11/20 0001      General Information   Behavior/Cognition Alert;Pleasant mood   overall was struggling   HPI Pt is a 74 y.o. 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         Treatment Provided   Treatment provided  Cognitive-Linquistic      Pain Assessment   Pain Assessment No/denies pain      Cognitive-Linquistic Treatment   Treatment focused on Aphasia    Skilled Treatment Minimal support needed when repeating basic story line conveyed in pictured strips       Assessment / Recommendations / [place] with current plan of care      Progression Toward Goals   Progression toward goals Progressing toward goals            SLP Education - 04/11/20 1819    Education Details continue reading    Person(s) Educated Patient;Spouse    Methods Explanation;Demonstration;Verbal cues;Handout    Comprehension Verbalized understanding              SLP Long Term Goals - 03/28/20 1726      SLP LONG TERM GOAL #1   Title Pt will utilize word retrieval strategies to communicate sentence level simple linguistic tasks with 75% accuracy.    Baseline 25%    Time 12    Period Weeks    Status On-going      SLP LONG TERM GOAL #2   Title Pt will answer basic yes/no questions with 75% accuracy and Minimal cues.    Baseline 25%    Status Achieved            Plan - 04/11/20 1820    Clinical Impression Statement Skilled treatment session targeted pt's expressive language goals. SLP facilitated session by providing minimal assistance when immediately repeating story line conveyed in pictured strips. Pt and his wife state that pt started reading the high-level language books previous referenced in other sessions. Pt stated that he read ~ 40 pages with improved fluency and retention of story line. Even picking up the book to read some of it by himself was huge progress.    Speech Therapy Frequency 2x / week    Duration --   12 weeks   Treatment/Interventions Patient/family education;Language facilitation    Potential to Achieve Goals Good    SLP Home Exercise Plan given    Consulted and Agree with Plan of Care Patient;Family member/caregiver    Family Member Consulted wife            Patient will benefit from skilled therapeutic intervention in order to improve the following deficits and impairments:   Aphasia    Problem List Patient Active Problem List   Diagnosis Date Noted  . Stroke ([place]) 12/07/2019  . CVA (cerebral vascular accident) ([place])  12/06/2019  . Gait abnormality 11/12/2019  . Tremor, essential 11/12/2019  . Health care maintenance 10/04/2018  . Thumb pain 10/04/2018  . Memory change 12/17/2017  . Paresthesia 12/03/2017  . Lightheaded 08/28/2017  . Encounter for chronic pain management 06/18/2017  . Neck pain 05/14/2017  . Panic 01/10/2017  . Dysuria 03/29/2016  . Muscle ache 10/05/2015  . Syncope 07/08/2015  . Chronic back pain 04/12/2015  . Medicare annual wellness visit, initial 03/08/2015  . Advance care planning 03/08/2015  . Dysgeusia 11/28/2014  . Anxiety state 10/27/2014  . Diabetic polyneuropathy ([place]) 02/14/2014  . Rhinitis, chronic 01/13/2012  . Basal cell carcinoma   . LUMBAR RADICULOPATHY 10/16/2010  . HLD (hyperlipidemia) 05/13/2008  . Abdominal pain 08/11/2007  . Uncontrolled type 2 diabetes mellitus with peripheral neuropathy ([place]) 05/26/2007  . Essential hypertension 05/26/2007  . SCOLIOSIS 05/26/2007  . Tremor 05/26/2007  . COLONIC POLYPS, HX OF 05/26/2007   Audri Kozub B. Rutherford Nail M.S., CCC-SLP, [place] Pathologist Rehabilitation Services Office [phone]  Stormy Fabian 04/11/2020, 6:21 PM  [place] MAIN [facility] SERVICES [address] [place], [place], [address] Phone: [phone]   Fax:  [phone]   Name: KADIN CANIPE MRN: [id number] Date of Birth: [birth year]

## 2020-04-14 ENCOUNTER — Ambulatory Visit: Payer: PPO | Admitting: Speech Pathology

## 2020-04-17 ENCOUNTER — Ambulatory Visit: Payer: PPO | Admitting: Speech Pathology

## 2020-04-17 ENCOUNTER — Other Ambulatory Visit: Payer: Self-pay

## 2020-04-17 DIAGNOSIS — R4701 Aphasia: Secondary | ICD-10-CM | POA: Diagnosis not present

## 2020-04-18 ENCOUNTER — Encounter: Payer: Self-pay | Admitting: Speech Pathology

## 2020-04-18 NOTE — Therapy (Signed)
[place] MAIN [facility] SERVICES [address] [place], [place], [address] Phone: [phone]   Fax:  [phone]  Speech Language Pathology Treatment  Patient Details  Name: Walter Harris MRN: [id number] Date of Birth: [birth year] Referring Provider (SLP): Elsie Stain   Encounter Date: 04/17/2020   End of Session - 04/18/20 1558    Visit Number 24    Number of Visits 43    Date for SLP Re-Evaluation 06/20/20    Authorization Type [employer]    Authorization Time Period 03/28/2020 - 06/20/20    Authorization - Visit Number 4    Progress Note Due on Visit 10    SLP Start Time 1100    SLP Stop Time  1200    SLP Time Calculation (min) 60 min    Activity Tolerance Patient tolerated treatment well           Past Medical History:  Diagnosis Date  . Basal cell carcinoma    multiple  . Cataracts, both eyes 2019   surgery pending April 2020  . Complication of anesthesia    post-operative cognitive dysfunction after ACDF on 05/01/11 [facility])  . Deaf    right ear  . Diabetes mellitus   . DJD (degenerative joint disease)    lumbar spine  . Gait abnormality 11/12/2019  . Hearing impaired    hearing aids-transmitter rt   . History of blood transfusion    as a baby in 60  . History of colonic polyps   . Hyperlipidemia   . Hypertension    pt. denies high blood pressure  . Kidney stone 1967  . Memory change 12/17/2017  . Nose fracture    3x  . OCD (obsessive compulsive disorder)   . Stroke [facility])    "mini stroke, that's what gave me the tremors"  . Stuttering   . Tremor, essential   . Vertigo    random  . Wears dentures    full upper  . Wears glasses     Past Surgical History:  Procedure Laterality Date  . APPENDECTOMY  1960  . BACK SURGERY  2012   lumbar surgery  . CATARACT EXTRACTION W/PHACO Right 04/12/2019   Procedure: CATARACT EXTRACTION PHACO AND INTRAOCULAR LENS PLACEMENT (IOC) RIGHT, DIABETIC;  Surgeon: [name], MD;  Location: [place];  Service: Ophthalmology;  Laterality: Right;  Diabetic - insulin and oral meds  . CATARACT EXTRACTION W/PHACO Left 05/10/2019   Procedure: CATARACT EXTRACTION PHACO AND INTRAOCULAR LENS PLACEMENT (IOC)  LEFT DIABETIC  00:25.6  14.4%  3.75;  Surgeon: [name], MD;  Location: [place];  Service: Ophthalmology;  Laterality: Left;  Diabetic - insulin and oral meds  . CERVICAL FUSION  1990  . CERVICAL FUSION  8/12   multiple levels with plates  . CHOLESTEATOMA EXCISION  1993  . COLLATERAL LIGAMENT REPAIR, ELBOW     bilateral, nerve improvement left 08/04, right 09/04  . COLONOSCOPY    . CYST EXCISION     back of cervical spine total of 5 surgeries  . EXTERNAL EAR SURGERY     multiple ear surguries as a child  . OTHER SURGICAL HISTORY     benign head tumor#5  . OTHER SURGICAL HISTORY     sebaceous cysts-post neck x5  . SHOULDER ARTHROSCOPY W/ ACROMIAL REPAIR  08/2004   left shoulder  . TONSILLECTOMY    . ULNAR NERVE TRANSPOSITION Right 05/13/2013   Procedure: RIGHT ULNAR NERVE  DECOMPRESSION/TRANSPOSITION;  Surgeon: [name]., MD;  Location: [place];  Service: Orthopedics;  Laterality: Right;    There were no vitals filed for this visit.   Subjective Assessment - 04/18/20 1557    Subjective pt appeared happy over his recent effort to read independently at home    Patient is accompained by: Family member    Currently in Pain? No/denies                 ADULT SLP TREATMENT - 04/18/20 0001      General Information   Behavior/Cognition Alert;Pleasant mood   overall was struggling   HPI Pt is a 74 y.o. 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         Treatment Provided   Treatment provided  Cognitive-Linquistic      Pain Assessment   Pain Assessment No/denies pain      Cognitive-Linquistic Treatment   Treatment focused on Aphasia    Skilled Treatment Minimal cues required to read complex written information from his personal book, pt with increased prosody, self-awareness of errors and good ability to self-correct, able to produce 2 sentences about chapter that he read at home       Assessment / Recommendations / [place] with current plan of care      Progression Toward Goals   Progression toward goals Progressing toward goals            SLP Education - 04/18/20 1558    Education Details continue reading    Person(s) Educated Patient;Spouse    Methods Explanation;Demonstration;Verbal cues    Comprehension Verbalized understanding              SLP Long Term Goals - 03/28/20 1726      SLP LONG TERM GOAL #1   Title Pt will utilize word retrieval strategies to communicate sentence level simple linguistic tasks with 75% accuracy.    Baseline 25%    Time 12    Period Weeks    Status On-going      SLP LONG TERM GOAL #2   Title Pt will answer basic yes/no questions with 75% accuracy and Minimal cues.    Baseline 25%    Status Achieved            Plan - 04/18/20 1559    Clinical Impression Statement Skilled treatment session targeted pt's expressive aphasia. SLP facilitated session by providing minimal cues to read complex language in his personal book. Pt had difficulty generating descriptions of objects so this book will build this skill as it is very descriptive. Pt able to answer basic [place] questions after reading several pages.    Speech Therapy Frequency 2x / week    Duration --   12 weeks   Treatment/Interventions Patient/family education;Language facilitation    Potential to Achieve Goals Good    SLP Home Exercise Plan given    Consulted and Agree with Plan of Care Patient;Family member/caregiver    Family Member Consulted wife             Patient will benefit from skilled therapeutic intervention in order to improve the following deficits and impairments:   Aphasia    Problem List Patient Active Problem List   Diagnosis Date Noted  . Stroke ([place]) 12/07/2019  . CVA (  cerebral vascular accident) ([place]) 12/06/2019  . Gait abnormality 11/12/2019  . Tremor, essential 11/12/2019  . Health care maintenance 10/04/2018  . Thumb pain 10/04/2018  . Memory change 12/17/2017  . Paresthesia 12/03/2017  . Lightheaded 08/28/2017  . Encounter for chronic pain management 06/18/2017  . Neck pain 05/14/2017  . Panic 01/10/2017  . Dysuria 03/29/2016  . Muscle ache 10/05/2015  . Syncope 07/08/2015  . Chronic back pain 04/12/2015  . Medicare annual wellness visit, initial 03/08/2015  . Advance care planning 03/08/2015  . Dysgeusia 11/28/2014  . Anxiety state 10/27/2014  . Diabetic polyneuropathy ([place]) 02/14/2014  . Rhinitis, chronic 01/13/2012  . Basal cell carcinoma   . LUMBAR RADICULOPATHY 10/16/2010  . HLD (hyperlipidemia) 05/13/2008  . Abdominal pain 08/11/2007  . Uncontrolled type 2 diabetes mellitus with peripheral neuropathy ([place]) 05/26/2007  . Essential hypertension 05/26/2007  . SCOLIOSIS 05/26/2007  . Tremor 05/26/2007  . COLONIC POLYPS, HX OF 05/26/2007   Oma Marzan B. Rutherford Nail M.S., CCC-SLP, [place] Office [phone]  Stormy Fabian 04/18/2020, 4:00 PM  [place] MAIN [facility] SERVICES [address] [place], [place], [address] Phone: [phone]   Fax:  [phone]   Name: Walter Harris MRN: [id number] Date of Birth: [birth year]

## 2020-04-19 ENCOUNTER — Encounter: Payer: Self-pay | Admitting: Speech Pathology

## 2020-04-19 ENCOUNTER — Other Ambulatory Visit: Payer: Self-pay

## 2020-04-19 ENCOUNTER — Other Ambulatory Visit: Payer: Self-pay | Admitting: Family Medicine

## 2020-04-19 ENCOUNTER — Ambulatory Visit: Payer: PPO | Admitting: Speech Pathology

## 2020-04-19 DIAGNOSIS — R4701 Aphasia: Secondary | ICD-10-CM

## 2020-04-19 NOTE — Telephone Encounter (Signed)
Sent. Thanks.   

## 2020-04-19 NOTE — Therapy (Signed)
[place] MAIN [facility] SERVICES [address] [place], [place], [address] Phone: [phone]   Fax:  [phone]  Speech Language Pathology Treatment  Patient Details  Name: Walter Harris MRN: [id number] Date of Birth: 01-12-1946 Referring Provider (SLP): Elsie Stain   Encounter Date: 04/19/2020   End of Session - 04/19/20 1224    Visit Number 25    Number of Visits 43    Date for SLP Re-Evaluation 06/20/20    Authorization Type [employer]    Authorization Time Period 03/28/2020 - 06/20/20    Authorization - Visit Number 5    Progress Note Due on Visit 10    SLP Start Time 1000    SLP Stop Time  1100    SLP Time Calculation (min) 60 min    Activity Tolerance Patient tolerated treatment well           Past Medical History:  Diagnosis Date  . Basal cell carcinoma    multiple  . Cataracts, both eyes 2019   surgery pending April 2020  . Complication of anesthesia    post-operative cognitive dysfunction after ACDF on 05/01/11 [facility])  . Deaf    right ear  . Diabetes mellitus   . DJD (degenerative joint disease)    lumbar spine  . Gait abnormality 11/12/2019  . Hearing impaired    hearing aids-transmitter rt   . History of blood transfusion    as a baby in 59  . History of colonic polyps   . Hyperlipidemia   . Hypertension    pt. denies high blood pressure  . Kidney stone 1967  . Memory change 12/17/2017  . Nose fracture    3x  . OCD (obsessive compulsive disorder)   . Stroke [facility])    "mini stroke, that's what gave me the tremors"  . Stuttering   . Tremor, essential   . Vertigo    random  . Wears dentures    full upper  . Wears glasses     Past Surgical History:  Procedure Laterality Date  . APPENDECTOMY  1960  . BACK SURGERY  2012   lumbar surgery  . CATARACT EXTRACTION W/PHACO Right 04/12/2019   Procedure: CATARACT EXTRACTION PHACO AND INTRAOCULAR LENS PLACEMENT (IOC) RIGHT, DIABETIC;  Surgeon: [name], MD;  Location: [place];  Service: Ophthalmology;  Laterality: Right;  Diabetic - insulin and oral meds  . CATARACT EXTRACTION W/PHACO Left 05/10/2019   Procedure: CATARACT EXTRACTION PHACO AND INTRAOCULAR LENS PLACEMENT (IOC)  LEFT DIABETIC  00:25.6  14.4%  3.75;  Surgeon: [name], MD;  Location: [place];  Service: Ophthalmology;  Laterality: Left;  Diabetic - insulin and oral meds  . CERVICAL FUSION  1990  . CERVICAL FUSION  8/12   multiple levels with plates  . CHOLESTEATOMA EXCISION  1993  . COLLATERAL LIGAMENT REPAIR, ELBOW     bilateral, nerve improvement left 08/04, right 09/04  . COLONOSCOPY    . CYST EXCISION     back of cervical spine total of 5 surgeries  . EXTERNAL EAR SURGERY     multiple ear surguries as a child  . OTHER SURGICAL HISTORY     benign head tumor#5  . OTHER SURGICAL HISTORY     sebaceous cysts-post neck x5  . SHOULDER ARTHROSCOPY W/ ACROMIAL REPAIR  08/2004   left shoulder  . TONSILLECTOMY    . ULNAR NERVE TRANSPOSITION Right 05/13/2013   Procedure: RIGHT ULNAR NERVE  DECOMPRESSION/TRANSPOSITION;  Surgeon: [name]., MD;  Location: [place];  Service: Orthopedics;  Laterality: Right;    There were no vitals filed for this visit.   Subjective Assessment - 04/19/20 1223    Subjective Pt appeared "out of sorts" d/t rushing to appointment    Patient is accompained by: Family member    Currently in Pain? No/denies                 ADULT SLP TREATMENT - 04/19/20 0001      General Information   Behavior/Cognition Alert;Pleasant mood   overall was struggling   HPI Pt is a 74 y.o. 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         Treatment Provided   Treatment provided Cognitive-Linquistic        Pain Assessment   Pain Assessment No/denies pain      Cognitive-Linquistic Treatment   Treatment focused on Aphasia    Skilled Treatment Naming words by letters 67% independently, increased to 100% with mild to moderate semantic cues, moderate difficulty expressing semi-complex thoughts       Assessment / Recommendations / Plan   Plan Continue with current plan of care      Progression Toward Goals   Progression toward goals Progressing toward goals            SLP Education - 04/19/20 1224    Education Details how reading builds expressive language    Person(s) Educated Patient;Spouse    Methods Explanation;Demonstration;Verbal cues    Comprehension Verbalized understanding              SLP Long Term Goals - 03/28/20 1726      SLP LONG TERM GOAL #1   Title Pt will utilize word retrieval strategies to communicate sentence level simple linguistic tasks with 75% accuracy.    Baseline 25%    Time 12    Period Weeks    Status On-going      SLP LONG TERM GOAL #2   Title Pt will answer basic yes/no questions with 75% accuracy and Minimal cues.    Baseline 25%    Status Achieved            Plan - 04/19/20 1225    Clinical Impression Statement Skilled treatment session focused on pt's expressive aphasia goals. SLP facilitated session by providing vocabulary building exercise of naming words by letters. Pt independently obtained 67% accuracy that increased to 100% with cues. Although his expressive abilities continue to improve, he continues to moderately struggle with communicating semi-complex self-generated responses.    Speech Therapy Frequency 2x / week    Duration --   12 weeks   Treatment/Interventions Patient/family education;Language facilitation    Potential to Achieve Goals Good    SLP Home Exercise Plan given    Consulted and Agree with Plan of Care Patient;Family member/caregiver    Family Member Consulted wife           Patient will benefit from  skilled therapeutic intervention in order to improve the following deficits and impairments:   Aphasia    Problem List Patient Active Problem List   Diagnosis Date Noted  . Stroke ([place]) 12/07/2019  . CVA (cerebral vascular accident) ([place]) 12/06/2019  . Gait abnormality 11/12/2019  . Tremor, essential 11/12/2019  . Health  care maintenance 10/04/2018  . Thumb pain 10/04/2018  . Memory change 12/17/2017  . Paresthesia 12/03/2017  . Lightheaded 08/28/2017  . Encounter for chronic pain management 06/18/2017  . Neck pain 05/14/2017  . Panic 01/10/2017  . Dysuria 03/29/2016  . Muscle ache 10/05/2015  . Syncope 07/08/2015  . Chronic back pain 04/12/2015  . Medicare annual wellness visit, initial 03/08/2015  . Advance care planning 03/08/2015  . Dysgeusia 11/28/2014  . Anxiety state 10/27/2014  . Diabetic polyneuropathy ([place]) 02/14/2014  . Rhinitis, chronic 01/13/2012  . Basal cell carcinoma   . LUMBAR RADICULOPATHY 10/16/2010  . HLD (hyperlipidemia) 05/13/2008  . Abdominal pain 08/11/2007  . Uncontrolled type 2 diabetes mellitus with peripheral neuropathy ([place]) 05/26/2007  . Essential hypertension 05/26/2007  . SCOLIOSIS 05/26/2007  . Tremor 05/26/2007  . COLONIC POLYPS, HX OF 05/26/2007   Isebella Upshur B. Rutherford Nail M.S., CCC-SLP, [place] Office [phone]  Stormy Fabian 04/19/2020, 12:37 PM  [place] MAIN [facility] SERVICES [address] [place], [place], [address] Phone: [phone]   Fax:  [phone]   Name: EINER MEALS MRN: [id number] Date of Birth: June 26, 1946

## 2020-04-19 NOTE — Telephone Encounter (Signed)
Refill request Alprazolam Last refill 02/25/20 #60/1 Last office visit 10/05/19

## 2020-04-25 ENCOUNTER — Other Ambulatory Visit: Payer: Self-pay

## 2020-04-25 ENCOUNTER — Encounter: Payer: Self-pay | Admitting: Family Medicine

## 2020-04-25 ENCOUNTER — Ambulatory Visit: Payer: PPO | Admitting: Speech Pathology

## 2020-04-25 ENCOUNTER — Ambulatory Visit (INDEPENDENT_AMBULATORY_CARE_PROVIDER_SITE_OTHER): Payer: PPO | Admitting: Family Medicine

## 2020-04-25 VITALS — BP 140/82 | HR 80 | Temp 97.9°F | Ht 68.0 in | Wt 189.4 lb

## 2020-04-25 DIAGNOSIS — R4701 Aphasia: Secondary | ICD-10-CM

## 2020-04-25 DIAGNOSIS — E1142 Type 2 diabetes mellitus with diabetic polyneuropathy: Secondary | ICD-10-CM | POA: Diagnosis not present

## 2020-04-25 DIAGNOSIS — F411 Generalized anxiety disorder: Secondary | ICD-10-CM

## 2020-04-25 DIAGNOSIS — I63312 Cerebral infarction due to thrombosis of left middle cerebral artery: Secondary | ICD-10-CM

## 2020-04-25 DIAGNOSIS — M549 Dorsalgia, unspecified: Secondary | ICD-10-CM | POA: Diagnosis not present

## 2020-04-25 DIAGNOSIS — G8929 Other chronic pain: Secondary | ICD-10-CM | POA: Diagnosis not present

## 2020-04-25 MED ORDER — OXYCODONE-ACETAMINOPHEN 5-325 MG PO TABS: ORAL_TABLET | ORAL | 0 refills | Status: DC

## 2020-04-25 MED ORDER — LANTUS SOLOSTAR 100 UNIT/ML ~~LOC~~ SOPN: 19.0000 [IU] | PEN_INJECTOR | Freq: Every day | SUBCUTANEOUS | Status: DC

## 2020-04-25 NOTE — Patient Instructions (Signed)
Update [place] when you need refills on your pain and anxiety medicine.  Keep working with therapy.  Thanks for your effort.  Take care.  Glad to see you.

## 2020-04-25 NOTE — Progress Notes (Signed)
This visit occurred during the SARS-CoV-2 public health emergency.  Safety protocols were in place, including screening questions prior to the visit, additional usage of staff PPE, and extensive cleaning of exam room while observing appropriate contact time as indicated for disinfecting solutions.  DM2 per endo.  Sugars have been controlled recently per patient report.  No lows. Sugar was 99 this AM and that was typical for patient (~100-110).  Hypoglycemia cautions d/w pt.    S/p MCA CVA with dysarthria noted.  Has been in speech therapy in the meantime.  He has tremor at baseline.  He is walking with a cane.  Some stuttering with speech but clearly intelligible.  Swallowing cautions d/w pt.  He is going to start PT soon, d/w pt.    Indication for chronic opioid: Chronic neck and back pain Medication and dose: oxycodone 5/325mg  # pills per month: 180 Last UDS date: 03/14/16 Pain contract signed (Y/N): yes Date narcotic database last reviewed (include red flags):04/25/20   Pain inventory (1-10) Average pain:5-6/10 is tolerable for patient. Pain now:"not bad now" with recent med use My pain is"constant" with pain meds. Pain is worse withactivity. Relief from meds:yes but limited duration from dose   In the last 24 hours, how much has pain interfered with the following (1-10 greatest interference)? General activity- sig limitation w/o pain meds. Relationships with others- no changes recently. Enjoyment of life- changes recently from cva. What time of the day is the pain the worst- at night and with movement Sleep is described by patient as- 5-6 hours at night, better with trazodone.  Mobility/function Assistance device:using a cane/walker prn.  How many minutes can you walk:limited after the cva Able to climb steps:yes Driving:no Disabled:no  Bowel or bladder symptoms:no Mood:at baseline. No changes  Meds, vitals, and allergies reviewed.   ROS: Per HPI unless  specifically indicated in ROS section   GEN: nad, alert and oriented HEENT: ncat NECK: supple w/o LA CV: rrr PULM: ctab, no inc wob ABD: soft, +bs EXT: no edema SKIN: well perfused.  B leg tremor noted.  Speech is intelligible with occasional stuttering pattern.  Diabetic foot exam: Normal inspection No skin breakdown No calluses  Normal DP pulses Normal sensation to light touch and monofilament Nails normal  At least 30 minutes were devoted to patient care in this encounter (this can potentially include time spent reviewing the patient's file/history, interviewing and examining the patient, counseling/reviewing plan with patient, ordering referrals, ordering tests, reviewing relevant laboratory or x-ray data, and documenting the encounter).

## 2020-04-26 ENCOUNTER — Encounter: Payer: Self-pay | Admitting: Speech Pathology

## 2020-04-26 NOTE — Assessment & Plan Note (Addendum)
Indication for chronic opioid: Chronic neck and back pain Medication and dose: oxycodone 5/325mg  # pills per month: 180 Last UDS date: 03/14/16 Pain contract signed (Y/N): yes Date narcotic database last reviewed (include red flags):04/25/20   Reasonable to continue as is.  Percocet helps with his pain, does not make him pain-free but makes the pain decreased to a manageable point.  Not sedated.  Routine cautions given.  He agrees.  Recheck periodically.

## 2020-04-26 NOTE — Assessment & Plan Note (Signed)
He is going to start physical therapy.  Continue with speech therapy.  He will update me as needed.

## 2020-04-26 NOTE — Assessment & Plan Note (Signed)
With relief but no sedation from benzodiazepine.  Cautions discussed with patient.  Continue alprazolam use as is.

## 2020-04-26 NOTE — Therapy (Signed)
[place] MAIN [facility] SERVICES [address] [place], [place], [address] Phone: [phone]   Fax:  [phone]  Speech Language Pathology Treatment  Patient Details  Name: Walter Harris MRN: [id number] Date of Birth: Jul 14, 1946 Referring Provider (SLP): Elsie Stain   Encounter Date: 04/25/2020   End of Session - 04/26/20 2039    Visit Number 26    Number of Visits 43    Date for SLP Re-Evaluation 06/20/20    Authorization Type [employer]    Authorization Time Period 03/28/2020 - 06/20/20    Authorization - Visit Number 6    Progress Note Due on Visit 10    SLP Start Time 1100    SLP Stop Time  1200    SLP Time Calculation (min) 60 min    Activity Tolerance Patient tolerated treatment well           Past Medical History:  Diagnosis Date  . Basal cell carcinoma    multiple  . Cataracts, both eyes 2019   surgery pending April 2020  . Complication of anesthesia    post-operative cognitive dysfunction after ACDF on 05/01/11 [facility])  . Deaf    right ear  . Diabetes mellitus   . DJD (degenerative joint disease)    lumbar spine  . Gait abnormality 11/12/2019  . Hearing impaired    hearing aids-transmitter rt   . History of blood transfusion    as a baby in 85  . History of colonic polyps   . Hyperlipidemia   . Hypertension    pt. denies high blood pressure  . Kidney stone 1967  . Memory change 12/17/2017  . Nose fracture    3x  . OCD (obsessive compulsive disorder)   . Stroke [facility])    "mini stroke, that's what gave me the tremors"  . Stuttering   . Tremor, essential   . Vertigo    random  . Wears dentures    full upper  . Wears glasses     Past Surgical History:  Procedure Laterality Date  . APPENDECTOMY  1960  . BACK SURGERY  2012   lumbar surgery  . CATARACT EXTRACTION W/PHACO Right 04/12/2019   Procedure: CATARACT EXTRACTION PHACO AND INTRAOCULAR LENS PLACEMENT (IOC) RIGHT, DIABETIC;  Surgeon: [name], MD;  Location: [place];  Service: Ophthalmology;  Laterality: Right;  Diabetic - insulin and oral meds  . CATARACT EXTRACTION W/PHACO Left 05/10/2019   Procedure: CATARACT EXTRACTION PHACO AND INTRAOCULAR LENS PLACEMENT (IOC)  LEFT DIABETIC  00:25.6  14.4%  3.75;  Surgeon: [name], MD;  Location: [place];  Service: Ophthalmology;  Laterality: Left;  Diabetic - insulin and oral meds  . CERVICAL FUSION  1990  . CERVICAL FUSION  8/12   multiple levels with plates  . CHOLESTEATOMA EXCISION  1993  . COLLATERAL LIGAMENT REPAIR, ELBOW     bilateral, nerve improvement left 08/04, right 09/04  . COLONOSCOPY    . CYST EXCISION     back of cervical spine total of 5 surgeries  . EXTERNAL EAR SURGERY     multiple ear surguries as a child  . OTHER SURGICAL HISTORY     benign head tumor#5  . OTHER SURGICAL HISTORY     sebaceous cysts-post neck x5  . SHOULDER ARTHROSCOPY W/ ACROMIAL REPAIR  08/2004   left shoulder  . TONSILLECTOMY    . ULNAR NERVE TRANSPOSITION Right 05/13/2013   Procedure: RIGHT ULNAR NERVE  DECOMPRESSION/TRANSPOSITION;  Surgeon: [name]., MD;  Location: [place];  Service: Orthopedics;  Laterality: Right;    There were no vitals filed for this visit.   Subjective Assessment - 04/26/20 2038    Subjective pt in good spirits today, readily joking    Patient is accompained by: Family member    Currently in Pain? No/denies                 ADULT SLP TREATMENT - 04/26/20 0001      General Information   Behavior/Cognition Alert;Pleasant mood;Cooperative    HPI Pt is a 74 y.o. 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         Treatment Provided   Treatment provided Cognitive-Linquistic      Pain Assessment    Pain Assessment No/denies pain      Cognitive-Linquistic Treatment   Treatment focused on Aphasia    Skilled Treatment Naming words by letters 90% independently, increased to 100% with supervision level cues, moderate difficulty expressing semi-complex thoughts        Assessment / Recommendations / Plan   Plan Continue with current plan of care      Progression Toward Goals   Progression toward goals Progressing toward goals            SLP Education - 04/26/20 2039    Education Details provided    Person(s) Educated Patient;Spouse    Methods Explanation;Demonstration;Verbal cues;Handout    Comprehension Verbalized understanding              SLP Long Term Goals - 03/28/20 1726      SLP LONG TERM GOAL #1   Title Pt will utilize word retrieval strategies to communicate sentence level simple linguistic tasks with 75% accuracy.    Baseline 25%    Time 12    Period Weeks    Status On-going      SLP LONG TERM GOAL #2   Title Pt will answer basic yes/no questions with 75% accuracy and Minimal cues.    Baseline 25%    Status Achieved            Plan - 04/26/20 2040    Clinical Impression Statement Skilled treatment session focused on pt's expressive aphasia goals. SLP facilitated session by providing vocabulary building exercise of naming words by letters. Pt independently obtained 90% accuracy that increased to 100% with supervision cues. Although his expressive abilities continue to improve, he continues to moderately struggle with communicating semi-complex self-generated responses.    Speech Therapy Frequency 2x / week    Duration --   12 weeks   Treatment/Interventions Patient/family education;Language facilitation    Potential to Achieve Goals Good    SLP Home Exercise Plan given    Consulted and Agree with Plan of Care Patient;Family member/caregiver    Family Member Consulted wife           Patient will benefit from skilled therapeutic intervention in order  to improve the following deficits and impairments:   Aphasia    Problem List Patient Active Problem List   Diagnosis Date Noted  . Stroke ([place]) 12/07/2019  . CVA (cerebral vascular accident) ([place]) 12/06/2019  . Gait abnormality 11/12/2019  . Tremor, essential 11/12/2019  . Health care maintenance 10/04/2018  . Thumb pain 10/04/2018  . Memory  change 12/17/2017  . Paresthesia 12/03/2017  . Lightheaded 08/28/2017  . Encounter for chronic pain management 06/18/2017  . Neck pain 05/14/2017  . Panic 01/10/2017  . Dysuria 03/29/2016  . Muscle ache 10/05/2015  . Syncope 07/08/2015  . Chronic back pain 04/12/2015  . Medicare annual wellness visit, initial 03/08/2015  . Advance care planning 03/08/2015  . Dysgeusia 11/28/2014  . Anxiety state 10/27/2014  . Diabetic polyneuropathy ([place]) 02/14/2014  . Rhinitis, chronic 01/13/2012  . Basal cell carcinoma   . LUMBAR RADICULOPATHY 10/16/2010  . HLD (hyperlipidemia) 05/13/2008  . Abdominal pain 08/11/2007  . Uncontrolled type 2 diabetes mellitus with peripheral neuropathy ([place]) 05/26/2007  . Essential hypertension 05/26/2007  . SCOLIOSIS 05/26/2007  . Tremor 05/26/2007  . COLONIC POLYPS, HX OF 05/26/2007   Walter Harris M.S., CCC-SLP, [place] Pathologist Rehabilitation Services Office [phone]   Stormy Fabian 04/26/2020, 8:41 PM  [place] MAIN [facility] SERVICES [address] [place], [place], [address] Phone: [phone]   Fax:  [phone]   Name: FROILAN MCLEAN MRN: [id number] Date of Birth: 12/29/1945

## 2020-04-26 NOTE — Assessment & Plan Note (Signed)
Discussed with patient. Sugars have been controlled recently per patient report.  No lows. Sugar was 99 this AM and that was typical for patient (~100-110).  Hypoglycemia cautions d/w pt. continue as is.

## 2020-04-28 ENCOUNTER — Ambulatory Visit: Payer: PPO | Admitting: Speech Pathology

## 2020-04-29 ENCOUNTER — Other Ambulatory Visit: Payer: Self-pay | Admitting: Family Medicine

## 2020-05-01 ENCOUNTER — Encounter: Payer: PPO | Admitting: Speech Pathology

## 2020-05-03 ENCOUNTER — Ambulatory Visit: Payer: PPO | Admitting: Speech Pathology

## 2020-05-03 ENCOUNTER — Other Ambulatory Visit: Payer: Self-pay

## 2020-05-03 DIAGNOSIS — R4701 Aphasia: Secondary | ICD-10-CM

## 2020-05-04 ENCOUNTER — Encounter: Payer: Self-pay | Admitting: Speech Pathology

## 2020-05-04 NOTE — Therapy (Signed)
[place] MAIN [facility] SERVICES [address] [place], [place], [address] Phone: [phone]   Fax:  [phone]  Speech Language Pathology Treatment  Patient Details  Name: Walter Harris MRN: [id number] Date of Birth: 08-May-1946 Referring Provider (SLP): Elsie Stain   Encounter Date: 05/03/2020   End of Session - 05/04/20 1304    Visit Number 27    Number of Visits 43    Date for SLP Re-Evaluation 06/20/20    Authorization Type [employer]    Authorization Time Period 03/28/2020 - 06/20/20    Authorization - Visit Number 7    Progress Note Due on Visit 10    SLP Start Time 1000    SLP Stop Time  1100    SLP Time Calculation (min) 60 min    Activity Tolerance Patient tolerated treatment well           Past Medical History:  Diagnosis Date  . Basal cell carcinoma    multiple  . Cataracts, both eyes 2019   surgery pending April 2020  . Complication of anesthesia    post-operative cognitive dysfunction after ACDF on 05/01/11 [facility])  . Deaf    right ear  . Diabetes mellitus   . DJD (degenerative joint disease)    lumbar spine  . Gait abnormality 11/12/2019  . Hearing impaired    hearing aids-transmitter rt   . History of blood transfusion    as a baby in 89  . History of colonic polyps   . Hyperlipidemia   . Hypertension    pt. denies high blood pressure  . Kidney stone 1967  . Memory change 12/17/2017  . Nose fracture    3x  . OCD (obsessive compulsive disorder)   . Stroke [facility])    "mini stroke, that's what gave me the tremors"  . Stuttering   . Tremor, essential   . Vertigo    random  . Wears dentures    full upper  . Wears glasses     Past Surgical History:  Procedure Laterality Date  . APPENDECTOMY  1960  . BACK SURGERY  2012   lumbar surgery  . CATARACT EXTRACTION W/PHACO Right 04/12/2019   Procedure: CATARACT EXTRACTION PHACO AND INTRAOCULAR LENS PLACEMENT (IOC) RIGHT, DIABETIC;  Surgeon: [name], MD;  Location: [place];  Service: Ophthalmology;  Laterality: Right;  Diabetic - insulin and oral meds  . CATARACT EXTRACTION W/PHACO Left 05/10/2019   Procedure: CATARACT EXTRACTION PHACO AND INTRAOCULAR LENS PLACEMENT (IOC)  LEFT DIABETIC  00:25.6  14.4%  3.75;  Surgeon: [name], MD;  Location: [place];  Service: Ophthalmology;  Laterality: Left;  Diabetic - insulin and oral meds  . CERVICAL FUSION  1990  . CERVICAL FUSION  8/12   multiple levels with plates  . CHOLESTEATOMA EXCISION  1993  . COLLATERAL LIGAMENT REPAIR, ELBOW     bilateral, nerve improvement left 08/04, right 09/04  . COLONOSCOPY    . CYST EXCISION     back of cervical spine total of 5 surgeries  . EXTERNAL EAR SURGERY     multiple ear surguries as a child  . OTHER SURGICAL HISTORY     benign head tumor#5  . OTHER SURGICAL HISTORY     sebaceous cysts-post neck x5  . SHOULDER ARTHROSCOPY W/ ACROMIAL REPAIR  08/2004   left shoulder  . TONSILLECTOMY    . ULNAR NERVE TRANSPOSITION Right 05/13/2013   Procedure: RIGHT ULNAR NERVE  DECOMPRESSION/TRANSPOSITION;  Surgeon: [name]., MD;  Location: [place];  Service: Orthopedics;  Laterality: Right;    There were no vitals filed for this visit.   Subjective Assessment - 05/04/20 1302    Subjective pt in good spirits today, readily joking    Patient is accompained by: Family member    Currently in Pain? No/denies                 ADULT SLP TREATMENT - 05/04/20 0001      General Information   Behavior/Cognition Alert;Pleasant mood;Cooperative    HPI Pt is a 74 y.o. 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         Treatment Provided   Treatment provided Cognitive-Linquistic      Pain Assessment    Pain Assessment No/denies pain      Cognitive-Linquistic Treatment   Treatment focused on Aphasia    Skilled Treatment Pt able to generate objects within a category with 70% accuracy that improved to 100% with maximal faded to moderate cues. Maximal cues required to use visualization strategy.        Assessment / Recommendations / Plan   Plan Continue with current plan of care      Progression Toward Goals   Progression toward goals Progressing toward goals            SLP Education - 05/04/20 1304    Education Details provided    Person(s) Educated Patient;Spouse    Methods Explanation;Demonstration;Verbal cues;Handout    Comprehension Verbalized understanding              SLP Long Term Goals - 03/28/20 1726      SLP LONG TERM GOAL #1   Title Pt will utilize word retrieval strategies to communicate sentence level simple linguistic tasks with 75% accuracy.    Baseline 25%    Time 12    Period Weeks    Status On-going      SLP LONG TERM GOAL #2   Title Pt will answer basic yes/no questions with 75% accuracy and Minimal cues.    Baseline 25%    Status Achieved            Plan - 05/04/20 1305    Clinical Impression Statement Skilled treatment session focused on pt's expressive aphasia goals. SLP facilitated session by providing maximal to moderate cues to generate words within a specified category. Pt limited his word finding to only things within his life. He needed extensive cues to list words that would be general for all people. For example, when asked to list items people might have in their garage, he only said "car" because that is all he has ever put in a garage. As a result, his expressive abilities continued to be moderately aphasic and prevent him from communicating wants/needs.    Speech Therapy Frequency 2x / week    Duration 12 weeks    Treatment/Interventions Patient/family education;Language facilitation    Potential to Achieve Goals Good    SLP Home  Exercise Plan given    Consulted and Agree with Plan of Care Patient;Family member/caregiver    Family Member Consulted wife           Patient will benefit from skilled therapeutic intervention in order to improve the following deficits and impairments:  Aphasia    Problem List Patient Active Problem List   Diagnosis Date Noted  . Stroke ([place]) 12/07/2019  . CVA (cerebral vascular accident) ([place] 12/06/2019  . Gait abnormality 11/12/2019  . Tremor, essential 11/12/2019  . Health care maintenance 10/04/2018  . Thumb pain 10/04/2018  . Memory change 12/17/2017  . Paresthesia 12/03/2017  . Lightheaded 08/28/2017  . Encounter for chronic pain management 06/18/2017  . Neck pain 05/14/2017  . Panic 01/10/2017  . Dysuria 03/29/2016  . Muscle ache 10/05/2015  . Syncope 07/08/2015  . Chronic back pain 04/12/2015  . Medicare annual wellness visit, initial 03/08/2015  . Advance care planning 03/08/2015  . Dysgeusia 11/28/2014  . Anxiety state 10/27/2014  . Diabetic polyneuropathy ([place]) 02/14/2014  . Rhinitis, chronic 01/13/2012  . Basal cell carcinoma   . LUMBAR RADICULOPATHY 10/16/2010  . HLD (hyperlipidemia) 05/13/2008  . Abdominal pain 08/11/2007  . Uncontrolled type 2 diabetes mellitus with peripheral neuropathy ([place]) 05/26/2007  . Essential hypertension 05/26/2007  . SCOLIOSIS 05/26/2007  . Tremor 05/26/2007  . COLONIC POLYPS, HX OF 05/26/2007   Briseidy Spark B. Rutherford Nail M.S., CCC-SLP, [place] Pathologist Rehabilitation Services Office [phone]  Stormy Fabian 05/04/2020, 1:06 PM  [place] MAIN [facility] SERVICES [address] [place], [place], [address] Phone: [phone]   Fax:  [phone]   Name: Walter Harris MRN: [id number] Date of Birth: Sep 13, 1945

## 2020-05-05 ENCOUNTER — Ambulatory Visit: Payer: PPO | Admitting: Speech Pathology

## 2020-05-09 ENCOUNTER — Ambulatory Visit: Payer: PPO | Admitting: Speech Pathology

## 2020-05-10 ENCOUNTER — Other Ambulatory Visit: Payer: Self-pay

## 2020-05-10 ENCOUNTER — Telehealth: Payer: Self-pay | Admitting: Family Medicine

## 2020-05-10 ENCOUNTER — Ambulatory Visit: Payer: PPO | Attending: Family Medicine | Admitting: Speech Pathology

## 2020-05-10 DIAGNOSIS — R2689 Other abnormalities of gait and mobility: Secondary | ICD-10-CM | POA: Insufficient documentation

## 2020-05-10 DIAGNOSIS — R4701 Aphasia: Secondary | ICD-10-CM | POA: Diagnosis not present

## 2020-05-10 DIAGNOSIS — M6281 Muscle weakness (generalized): Secondary | ICD-10-CM | POA: Insufficient documentation

## 2020-05-10 DIAGNOSIS — M549 Dorsalgia, unspecified: Secondary | ICD-10-CM

## 2020-05-10 NOTE — Telephone Encounter (Signed)
Several issues.  Just had phone call with Stormy Fabian with speech therapy.  She called on behalf of the patient.  Patient is aware that she was going to call.  Happi reported that the patient had seen Dr. Edwina Barth because the patient apparently thought we could not admit the patient to the hospital at [facility].  I do not directly admit patients but we can still admit patients through the emergency room to the hospitalist service.  She will explain this to the patient.  His possible concern over possible hospital admission should not be a reason to stop coming here for care.  Patient had seen Dr. Edwina Barth and been prescribed Celexa but his mood was worse in the meantime and Mrs. Rutherford Nail thought that the patient needed follow-up regarding his mood.  I am glad to see the patient for this but it makes sense to establish and pick 1 primary care office to manage this, either here or Dr. Edwina Barth.  Ms. Rutherford Nail is going to communicate this to the patient.  She reports that the patient consents for physical therapy.  I put in the order.  If the patient/his wife does not call here later today to schedule follow-up regarding his mood then please call them and schedule follow-up 30-minute appointment.  Thanks.

## 2020-05-11 ENCOUNTER — Ambulatory Visit: Payer: PPO | Admitting: Speech Pathology

## 2020-05-11 ENCOUNTER — Encounter: Payer: Self-pay | Admitting: Speech Pathology

## 2020-05-11 NOTE — Telephone Encounter (Signed)
Appointment scheduled.

## 2020-05-11 NOTE — Therapy (Signed)
[place] MAIN [facility] SERVICES [address] [place], [place], [address] Phone: [phone]   Fax:  [phone]  Speech Language Pathology Treatment  Patient Details  Name: Walter Harris MRN: [id number] Date of Birth: 26-Feb-1946 Referring Provider (SLP): Elsie Stain   Encounter Date: 05/10/2020   End of Session - 05/11/20 1633    Visit Number 28    Number of Visits 61    Date for SLP Re-Evaluation 06/20/20    Authorization Type [employer]    Authorization Time Period 03/28/2020 - 06/20/20    Authorization - Visit Number 8    Progress Note Due on Visit 10    SLP Start Time 1000    SLP Stop Time  1100    SLP Time Calculation (min) 60 min    Activity Tolerance Patient tolerated treatment well           Past Medical History:  Diagnosis Date   Basal cell carcinoma    multiple   Cataracts, both eyes 2019   surgery pending April 1829   Complication of anesthesia    post-operative cognitive dysfunction after ACDF on 05/01/11 [facility])   Deaf    right ear   Diabetes mellitus    DJD (degenerative joint disease)    lumbar spine   Gait abnormality 11/12/2019   Hearing impaired    hearing aids-transmitter rt    History of blood transfusion    as a baby in [birth year]   History of colonic polyps    Hyperlipidemia    Hypertension    pt. denies high blood pressure   Kidney stone 1967   Memory change 12/17/2017   Nose fracture    3x   OCD (obsessive compulsive disorder)    Stroke ([place])    "mini stroke, that's what gave me the tremors"   Stuttering    Tremor, essential    Vertigo    random   Wears dentures    full upper   Wears glasses     Past Surgical History:  Procedure Laterality Date   [place]  2012   lumbar surgery   CATARACT EXTRACTION W/PHACO Right 04/12/2019   Procedure: CATARACT EXTRACTION PHACO AND INTRAOCULAR LENS PLACEMENT ([place]) RIGHT, DIABETIC;  Surgeon: [name], MD;  Location: [place];  Service: Ophthalmology;  Laterality: Right;  Diabetic - insulin and oral meds   CATARACT EXTRACTION W/PHACO Left 05/10/2019   Procedure: CATARACT EXTRACTION PHACO AND INTRAOCULAR LENS PLACEMENT ([facility])  LEFT DIABETIC  00:25.6  14.4%  3.75;  Surgeon: [name], MD;  Location: [place];  Service: Ophthalmology;  Laterality: Left;  Diabetic - insulin and oral meds   CERVICAL FUSION  1990   CERVICAL FUSION  8/12   multiple levels with plates   [place]     bilateral, nerve improvement left 08/04, right 09/04   COLONOSCOPY     CYST EXCISION     back of cervical spine total of 5 surgeries   EXTERNAL EAR SURGERY     multiple ear surguries as a child   OTHER SURGICAL HISTORY     benign head tumor#5   OTHER SURGICAL HISTORY     sebaceous cysts-post neck x5   SHOULDER ARTHROSCOPY W/ ACROMIAL REPAIR  08/2004   left shoulder   TONSILLECTOMY     ULNAR NERVE TRANSPOSITION Right 05/13/2013   Procedure: RIGHT ULNAR NERVE  DECOMPRESSION/TRANSPOSITION;  Surgeon: [name]., MD;  Location: [place];  Service: Orthopedics;  Laterality: Right;    There were no vitals filed for this visit.   Subjective Assessment - 05/11/20 1632    Patient is accompained by: Family member    Currently in Pain? No/denies                 ADULT SLP TREATMENT - 05/11/20 0001      General Information   Behavior/Cognition Alert;Pleasant mood;Cooperative    HPI Pt is a 74 y.o. 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         Treatment Provided   Treatment provided Cognitive-Linquistic      Pain Assessment   Pain Assessment No/denies pain      Cognitive-Linquistic  Treatment   Treatment focused on Aphasia    Skilled Treatment Skilled assistance provided for pt to express reason that he was so distraught, irritable, frustrated, and negative       Assessment / Recommendations / [place] with current plan of care      Progression Toward Goals   Progression toward goals Progressing toward goals            SLP Education - 05/11/20 1633    Education Details provided    Person(s) Educated Patient;Spouse    Methods Explanation;Demonstration;Verbal cues;Handout    Comprehension Verbalized understanding              SLP Long Term Goals - 03/28/20 1726      SLP LONG TERM GOAL #1   Title Pt will utilize word retrieval strategies to communicate sentence level simple linguistic tasks with 75% accuracy.    Baseline 25%    Time 12    Period Weeks    Status On-going      SLP LONG TERM GOAL #2   Title Pt will answer basic yes/no questions with 75% accuracy and Minimal cues.    Baseline 25%    Status Achieved            Plan - 05/11/20 1633    Clinical Impression Statement Pt arrived to session extremely negative and unable to express himself as well as previous sessions d/t level of frustration. With extensive questions and cues, pt expresses that he continues to suffer severe depression and states he doesnt want to live this way anymore. Additional complaints include decreased ability to walk around the house. SLP attempted to generate solutions with pt and his wife. While pt did consent to a referral to PT, he was not able to generate any other solutions. There were moments when he lashed out at his wife which were very unusual in therapy sessions. Plan made for therapist to call Dr [name] and convey concerns voiced in session. Additionally, a plan also made for SLP to gather some WWII reading materials to inspire further interest in sessions.   As discussed in session, this Probation officer spoke with Dr [name] via phone. I conveyed to him  Andie's level of depression and comments made during session. Dr [name] will make an appointment in the immediate future to further assess pt's medication/anti-depressant, make referral for PT and he will continue to be contact for pt's POC.       Speech Therapy Frequency 2x / week  Duration 12 weeks    Treatment/Interventions Patient/family education;Language facilitation    Potential to Achieve Goals Good    SLP Home Exercise Plan given    Consulted and Agree with Plan of Care Patient;Family member/caregiver    Family Member Consulted wife           Patient will benefit from skilled therapeutic intervention in order to improve the following deficits and impairments:   Aphasia    Problem List Patient Active Problem List   Diagnosis Date Noted   Stroke ([place]) 12/07/2019   CVA (cerebral vascular accident) ([place]) 12/06/2019   Gait abnormality 11/12/2019   Tremor, essential 11/12/2019   Health care maintenance 10/04/2018   Thumb pain 10/04/2018   Memory change 12/17/2017   Paresthesia 12/03/2017   Lightheaded 08/28/2017   Encounter for chronic pain management 06/18/2017   Neck pain 05/14/2017   Panic 01/10/2017   Dysuria 03/29/2016   Muscle ache 10/05/2015   Syncope 07/08/2015   Chronic back pain 04/12/2015   Medicare annual wellness visit, initial 03/08/2015   Advance care planning 03/08/2015   Dysgeusia 11/28/2014   Anxiety state 10/27/2014   Diabetic polyneuropathy ([place]) 02/14/2014   Rhinitis, chronic 01/13/2012   Basal cell carcinoma    LUMBAR RADICULOPATHY 10/16/2010   HLD (hyperlipidemia) 05/13/2008   Abdominal pain 08/11/2007   Uncontrolled type 2 diabetes mellitus with peripheral neuropathy ([place]) 05/26/2007   Essential hypertension 05/26/2007   SCOLIOSIS 05/26/2007   Tremor 05/26/2007   COLONIC POLYPS, HX OF 05/26/2007   Brennon Otterness B. Rutherford Nail M.S., CCC-SLP, [place] Pathologist Rehabilitation Services Office  [phone]  Stormy Fabian 05/11/2020, 4:34 PM  [place] MAIN [facility] SERVICES [address] [place], [place], [address] Phone: [phone]   Fax:  [phone]   Name: VARUN JOURDAN MRN: [id number] Date of Birth: 12/24/1945

## 2020-05-12 ENCOUNTER — Encounter: Payer: PPO | Admitting: Speech Pathology

## 2020-05-12 NOTE — Telephone Encounter (Signed)
Thanks

## 2020-05-16 ENCOUNTER — Ambulatory Visit: Payer: PPO | Admitting: Family Medicine

## 2020-05-16 ENCOUNTER — Ambulatory Visit: Payer: PPO | Admitting: Speech Pathology

## 2020-05-17 ENCOUNTER — Telehealth (INDEPENDENT_AMBULATORY_CARE_PROVIDER_SITE_OTHER): Payer: PPO | Admitting: Family Medicine

## 2020-05-17 ENCOUNTER — Other Ambulatory Visit: Payer: Self-pay

## 2020-05-17 ENCOUNTER — Encounter: Payer: Self-pay | Admitting: Family Medicine

## 2020-05-17 DIAGNOSIS — E1142 Type 2 diabetes mellitus with diabetic polyneuropathy: Secondary | ICD-10-CM | POA: Diagnosis not present

## 2020-05-17 DIAGNOSIS — G8929 Other chronic pain: Secondary | ICD-10-CM | POA: Diagnosis not present

## 2020-05-17 DIAGNOSIS — Z8673 Personal history of transient ischemic attack (TIA), and cerebral infarction without residual deficits: Secondary | ICD-10-CM | POA: Diagnosis not present

## 2020-05-17 DIAGNOSIS — F411 Generalized anxiety disorder: Secondary | ICD-10-CM

## 2020-05-17 MED ORDER — TRAZODONE HCL 50 MG PO TABS
50.0000 mg | ORAL_TABLET | Freq: Every day | ORAL | Status: DC
Start: 2020-05-17 — End: 2020-05-17

## 2020-05-17 MED ORDER — ASPIRIN 81 MG PO TBEC: 81.0000 mg | DELAYED_RELEASE_TABLET | Freq: Every day | ORAL | Status: DC

## 2020-05-17 MED ORDER — GLIPIZIDE ER 10 MG PO TB24: 10.0000 mg | ORAL_TABLET | Freq: Every day | ORAL | Status: DC

## 2020-05-17 MED ORDER — METFORMIN HCL ER 500 MG PO TB24: ORAL_TABLET | ORAL | Status: DC

## 2020-05-17 MED ORDER — LANTUS SOLOSTAR 100 UNIT/ML ~~LOC~~ SOPN: 19.0000 [IU] | PEN_INJECTOR | Freq: Every day | SUBCUTANEOUS | Status: DC

## 2020-05-17 MED ORDER — TIZANIDINE HCL 4 MG PO TABS: ORAL_TABLET | ORAL | Status: DC

## 2020-05-17 MED ORDER — PROPRANOLOL HCL ER 60 MG PO CP24: 60.0000 mg | ORAL_CAPSULE | Freq: Every day | ORAL | Status: DC

## 2020-05-17 MED ORDER — CANAGLIFLOZIN 300 MG PO TABS: 300.0000 mg | ORAL_TABLET | Freq: Every day | ORAL | Status: DC

## 2020-05-17 MED ORDER — FLUOXETINE HCL 10 MG PO CAPS: 30.0000 mg | ORAL_CAPSULE | Freq: Every day | ORAL | 1 refills | Status: DC

## 2020-05-17 MED ORDER — IRBESARTAN 150 MG PO TABS: 150.0000 mg | ORAL_TABLET | Freq: Every day | ORAL | Status: DC

## 2020-05-17 MED ORDER — AMLODIPINE BESYLATE 5 MG PO TABS: 5.0000 mg | ORAL_TABLET | Freq: Every day | ORAL | Status: DC

## 2020-05-17 MED ORDER — PRIMIDONE 50 MG PO TABS: 50.0000 mg | ORAL_TABLET | Freq: Every day | ORAL | Status: DC

## 2020-05-17 MED ORDER — CLOPIDOGREL BISULFATE 75 MG PO TABS: 75.0000 mg | ORAL_TABLET | Freq: Every day | ORAL | Status: DC

## 2020-05-17 MED ORDER — TRAZODONE HCL 50 MG PO TABS
ORAL_TABLET | ORAL | Status: DC
Start: 2020-05-17 — End: 2020-06-16

## 2020-05-17 MED ORDER — ALPRAZOLAM 0.5 MG PO TABS: ORAL_TABLET | ORAL | Status: DC

## 2020-05-17 NOTE — Progress Notes (Signed)
Interactive audio and video telecommunications were attempted between this provider and patient, however failed, due to patient having technical difficulties OR patient did not have access to video capability.  We continued and completed visit with audio only.   Virtual Visit via Telephone Note  I connected with patient on 05/17/20  at 2:07 PM  by telephone and verified that I am speaking with the correct person using two identifiers.  Location of patient:  Home  Location of MD: [facility] Name of referring provider (if blank then none associated): Names per persons and role in encounter:  MD: Walter Harris, Patient: name listed above.    I discussed the limitations, risks, security and privacy concerns of performing an evaluation and management service by telephone and the availability of in person appointments. I also discussed with the patient that there may be a patient responsible charge related to this service. The patient expressed understanding and agreed to proceed.  CC: f/u.   History of Present Illness:   We talked about recent cancellation.   My understanding is that staff at our clinic did not cancel his office visit yesterday.  I will check with staff on that.  I apologize the patient in the meantime for any inconvenience.  He was gracious about this, as was his wife.  We talked about follow up at [facility].  We can still admit the patient to the hospital through the hospitalist service or the emergency room at [facility].  This was his main concern about being seen through [place] [facility] but we should be able to take care of this.  He understood the process and he is still going to follow-up here in our clinic.  We talked about having only one primary care doctor so we did not duplicate or complicate his care.  He agreed.  Med list reviewed.  Apparently he still taking fluoxetine 20 mg a day but has also been started on citalopram 10 mg a day per outside MD.  He has been on that  combination for several months.  Mood discussed with patient.  He admits that his mood had not been great but he is also had significant setbacks with pain, Covid restrictions, etc.  He still thought that he was overall improving compared to his previous baseline with therapy.  We talked about medication options, specifically changing to 30 mg of fluoxetine and eliminating the overlap between citalopram and fluoxetine, by stopping citalopram.  [facility] will need a refill on his fluoxetine.  We talked about PT follow up and ST. he is going to try to reestablish with both and hopefully do both on the same day.  There is a concern that he will be too tired to do both on the same day but he is going to see if he can tolerate this.   Observations/Objective: No apparent distress Judgment intact. Speech at baseline.  Assessment and Plan:  Chronic pain, still on baseline opiates without sedation.  Normal speech today.  History of CVA, he is going to follow-up with PT and speech therapy.  I appreciate the help of all involved.  Panic/mood changes/anxiety can increase fluoxetine from 20 mg to 30 mg and stop citalopram.  Will make this change for now and see how he does.  He will update me as needed.  I appreciate the help of all involved.   He will still follow-up with endocrine clinic per routine.  Follow Up Instructions: see above.  I discussed the assessment and treatment plan with the patient. The patient was provided an opportunity to ask questions and all were answered. The patient agreed with the plan and demonstrated an understanding of the instructions.   The patient was advised to call back or seek an in-person evaluation if the symptoms worsen or if the condition fails to improve as anticipated.  I provided 32 minutes of non-face-to-face time during this encounter.   Elsie Stain, MD

## 2020-05-17 NOTE — Assessment & Plan Note (Signed)
  History of CVA, he is going to follow-up with PT and speech therapy.  I appreciate the help of all involved.  No change in medications at this point otherwise.

## 2020-05-17 NOTE — Assessment & Plan Note (Signed)
Panic/mood changes/anxiety can increase fluoxetine from 20 mg to 30 mg and stop citalopram.  Will make this change for now and see how he does.  He will update me as needed.  I appreciate the help of all involved.

## 2020-05-17 NOTE — Assessment & Plan Note (Signed)
Chronic pain, still on baseline opiates without sedation.  Normal speech today.

## 2020-05-17 NOTE — Assessment & Plan Note (Signed)
He will still follow-up with endocrine clinic per routine.

## 2020-05-18 ENCOUNTER — Encounter: Payer: PPO | Admitting: Speech Pathology

## 2020-05-23 ENCOUNTER — Ambulatory Visit: Payer: PPO | Admitting: Neurology

## 2020-05-24 ENCOUNTER — Encounter: Payer: PPO | Admitting: Speech Pathology

## 2020-05-24 ENCOUNTER — Other Ambulatory Visit: Payer: Self-pay | Admitting: Family Medicine

## 2020-05-24 NOTE — Telephone Encounter (Signed)
Sent. Thanks.   

## 2020-05-24 NOTE — Telephone Encounter (Signed)
Refill request Alprazolam Last refill 04/20/20 #60 Last office visit 05/17/20 virtual

## 2020-05-26 ENCOUNTER — Encounter: Payer: PPO | Admitting: Speech Pathology

## 2020-05-29 ENCOUNTER — Encounter: Payer: Self-pay | Admitting: Physical Therapy

## 2020-05-29 ENCOUNTER — Ambulatory Visit: Payer: PPO | Admitting: Physical Therapy

## 2020-05-29 ENCOUNTER — Other Ambulatory Visit: Payer: Self-pay

## 2020-05-29 ENCOUNTER — Ambulatory Visit: Payer: PPO | Admitting: Speech Pathology

## 2020-05-29 DIAGNOSIS — R2689 Other abnormalities of gait and mobility: Secondary | ICD-10-CM

## 2020-05-29 DIAGNOSIS — M6281 Muscle weakness (generalized): Secondary | ICD-10-CM

## 2020-05-29 DIAGNOSIS — R4701 Aphasia: Secondary | ICD-10-CM

## 2020-05-29 NOTE — Therapy (Signed)
[place] [facility] SERVICES [address] [place], [place], [address] Phone: [phone]   Fax:  [phone]  Physical Therapy Evaluation  Patient Details  Name: Walter Harris MRN: [id number] Date of Birth: [birth year] Referring Provider (PT): Elsie Stain MD   Encounter Date: 05/29/2020   PT End of Session - 05/29/20 1213    Visit Number 1    Number of Visits 17    Date for PT Re-Evaluation 07/24/20    Authorization Type FOTO-PT    PT Start Time 1102    PT Stop Time 1200    PT Time Calculation (min) 58 min    Equipment Utilized During Treatment Gait belt    Activity Tolerance Patient limited by pain    Behavior During Therapy Restless;Impulsive           Past Medical History:  Diagnosis Date   Basal cell carcinoma    multiple   Cataracts, both eyes 2019   surgery pending April 3382   Complication of anesthesia    post-operative cognitive dysfunction after ACDF on 05/01/11 [facility])   Deaf    right ear   Diabetes mellitus    DJD (degenerative joint disease)    lumbar spine   Gait abnormality 11/12/2019   Hearing impaired    hearing aids-transmitter rt    History of blood transfusion    as a baby in 1945/12/02   History of colonic polyps    Hyperlipidemia    Hypertension    pt. denies high blood pressure   Kidney stone 1967   Memory change 12/17/2017   Nose fracture    3x   OCD (obsessive compulsive disorder)    Stroke ([place])    "mini stroke, that's what gave me the tremors"   Stuttering    Tremor, essential    Vertigo    random   Wears dentures    full upper   Wears glasses     Past Surgical History:  Procedure Laterality Date   [place]  2012   lumbar surgery   CATARACT EXTRACTION W/PHACO Right 04/12/2019   Procedure: CATARACT EXTRACTION PHACO AND INTRAOCULAR LENS PLACEMENT ([place]) RIGHT, DIABETIC;  Surgeon: [name], MD;  Location: [place];   Service: Ophthalmology;  Laterality: Right;  Diabetic - insulin and oral meds   CATARACT EXTRACTION W/PHACO Left 05/10/2019   Procedure: CATARACT EXTRACTION PHACO AND INTRAOCULAR LENS PLACEMENT ([facility])  LEFT DIABETIC  00:25.6  14.4%  3.75;  Surgeon: [name], MD;  Location: [facility];  Service: Ophthalmology;  Laterality: Left;  Diabetic - insulin and oral meds   CERVICAL FUSION  1990   CERVICAL FUSION  8/12   multiple levels with plates   [place]     bilateral, nerve improvement left 08/04, right 09/04   COLONOSCOPY     CYST EXCISION     back of cervical spine total of 5 surgeries   EXTERNAL EAR SURGERY     multiple ear surguries as a child   OTHER SURGICAL HISTORY     benign head tumor#5   OTHER SURGICAL HISTORY     sebaceous cysts-post neck x5   SHOULDER ARTHROSCOPY W/ ACROMIAL REPAIR  08/2004   left shoulder   TONSILLECTOMY     ULNAR NERVE TRANSPOSITION Right 05/13/2013   Procedure: RIGHT ULNAR NERVE DECOMPRESSION/TRANSPOSITION;  Surgeon: [name]., MD;  Location: [facility];  Service: Orthopedics;  Laterality: Right;    There were no vitals filed for this visit.    Subjective Assessment - 05/29/20 1024    Subjective "I had a stroke and it has affected my balance."    Patient is accompained by: Family member   wife, [name]   Pertinent History Pt is a 74 y.o. 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 He did recieve acute care services while in the hospital and was referred for outpatient services in April 2021. However when he attended his evaluation he refused services stating, "I just need speech therapy"  He has been received speech therapy since April. Patient reports his balance has been impaired but he denies any falls in last month. He has a history of back pain and reports as a result his mobility had declined including not being able to bend over. He reports using a cane and walker intermittently; He does have a rollator but hasn't been using it. He reports he spends most of his time sitting in the chair.    How long can you sit comfortably? 1 hour and then unable to get up    How long can you stand comfortably? 5 min and then increased back pain;    How long can you walk comfortably? 50-100 feet    Diagnostic tests MRI in March 2021: Acute infarct within the anterior left MCA territory    Patient Stated Goals improve leg strength, be able to transfer from lower surface such as the sofa,    Currently in Pain? Yes    Pain Score 6     Pain Location Back    Pain Orientation Lower    Pain Descriptors / Indicators Sharp;Shooting    Pain Type Chronic pain    Pain Radiating Towards radiates down BLE    Pain Onset More than a month ago    Pain Frequency Constant    Aggravating Factors  prolonged position    Pain Relieving Factors movement, unsure    Effect of Pain on Daily Activities decreased mobility;    Multiple Pain Sites No              [facility] PT Assessment - 05/29/20 0001      Assessment   Medical Diagnosis s/p CVA    Referring Provider (PT) Elsie Stain MD    Onset Date/Surgical Date 12/06/19    Hand Dominance Right    Prior Therapy had acute care PT services after CVA but has not received outpatient services for this condition; He has been receiving outpatient speech therapy for aphasia      Precautions   Precautions Fall      Restrictions   Weight Bearing Restrictions No      Balance Screen   Has the patient fallen in the past 6 months No    Has the patient had a decrease in activity level because of a fear of falling?  No    Is the patient reluctant to leave their  home because of a fear of falling?  No      Home Environment   Living Environment Private residence    Living Arrangements Spouse/significant other    Available Help at Discharge Family  Type of [place] to enter    Entrance Stairs-Number of Steps 3-4    Entrance Stairs-Rails Right;Left;Can reach both   able to negotiate reciprocally   Home Layout One level    [place] - 2 wheels;Walker - 4 wheels;Cane - single point    Additional Comments mod I for self care ADLs, will help some with vaccuum; not driving, not mowing      Prior Function   Level of Independence Independent with basic ADLs;Independent with transfers;Independent with gait    Vocation Retired    Agricultural engineer, hasn't done in several years      Observation/Other Assessments   Focus on Therapeutic Outcomes (FOTO)  55%      Sensation   Light Touch Appears Intact      Coordination   Gross Motor Movements are Fluid and Coordinated --   mixed, has tremors in BUE and BLE which limits coordination   Fine Motor Movements are Fluid and Coordinated No   has tremors in BUE and BLE      Posture/Postural Control   Posture Comments WFL      ROM / Strength   AROM / PROM / Strength AROM;Strength      AROM   Overall AROM Comments WFL      Strength   Strength Assessment Site Hip;Knee;Ankle    Right/Left Hip Right;Left    Right Hip Flexion 3+/5    Left Hip Flexion 4/5    Right/Left Knee Right;Left    Right Knee Flexion 4/5    Right Knee Extension 4/5    Left Knee Flexion 5/5    Left Knee Extension 5/5    Right/Left Ankle Right;Left    Right Ankle Dorsiflexion 3+/5    Left Ankle Dorsiflexion 3+/5      Transfers   Comments able to transfer without pushing on arm rests but very challenged requiring CGA to avoid falling      Ambulation/Gait   Gait Comments pt ambulates with RW, supervision for safety, with reciprocal gait pattern, slight right foot drag, decreased step length,  slower gait speed;      Standardized Balance Assessment   Standardized Balance Assessment Five Times Sit to Stand;10 meter walk test    Five times sit to stand comments  24.98 sec with 2 HHA pushing on chair    10 Meter Walk 0.57 m/s with RW, limited home ambulator      High Level Balance   High Level Balance Comments static standing balance is poor, dynamic standing balance is poor                      Objective measurements completed on examination: See above findings.               PT Education - 05/29/20 1213    Education Details recommendations/POC    Person(s) Educated Patient;Spouse    Methods Explanation    Comprehension Verbalized understanding            PT Short Term Goals - 05/29/20 1219      PT SHORT TERM GOAL #1   Title Patient will be adherent to HEP at least 3x a week to improve functional strength and balance for better safety at home.    Time 4    Period Weeks    Status New    Target Date 06/26/20      PT SHORT TERM GOAL #2  Title Patient will be able to transfer sit<>stand from regular height chair without pushing on arm rests, indepedently with minimal difficulty to improve transfer ability;    Time 4    Period Weeks    Status New    Target Date 06/26/20             PT Long Term Goals - 05/29/20 1220      PT LONG TERM GOAL #1   Title Patient (> 77 years old) will complete five times sit to stand test in < 15 seconds indicating an increased LE strength and improved balance.    Time 8    Period Weeks    Status New    Target Date 07/24/20      PT LONG TERM GOAL #2   Title Patient will increase 10 meter walk test to >1.16m/s as to improve gait speed for better community ambulation and to reduce fall risk.    Time 8    Period Weeks    Status New    Target Date 07/24/20      PT LONG TERM GOAL #3   Title Patient will increase BLE gross strength to 4+/5 as to improve functional strength for independent gait, increased  standing tolerance and increased ADL ability.    Time 8    Period Weeks    Status New    Target Date 07/24/20      PT LONG TERM GOAL #4   Title Patient will improve functional mobility as evidenced by improved FOTO score to >60% to indicate improved mobility in the home.    Time 8    Period Weeks    Status New    Target Date 07/24/20                  Plan - 05/29/20 1033    Clinical Impression Statement 74 yo Male s/p CVA with resultant weakness and imbalance. Patient has multiple co-morbidities including chronic back pain and chronic essential tremors which affect his mobility. He reports over last several months he has seen a decline in his mobility with difficulty with transfers and walking. He denies any recent falls. He will use a cane/RW for most walking in his home and is limited to short distances due to back pain. Patient does exhibit increased weakness in BLE (R>L). He also tested as a high fall risk as evidenced by 5 times sit<>stand and 10  meter walk test. Patient's referral was originally for low back pain but he requested to not address back pain as this is chronic and he has done multiple bouts of therapy for back pain without relief. He is more concerned about weakness and imbalance as a result of recent CVA. He is currently receiving speech therapy for expressive aphasia. He would benefit from additional skilled PT intervention to improve strength, balance and mobility;    Personal Factors and Comorbidities Comorbidity 3+;Age;Past/Current Experience;Time since onset of injury/illness/exacerbation    Comorbidities DM, HTN, HLD, past CVA with expressive aphasia, gait abnormality, back pain with DJD, hard of hearing    Examination-Activity Limitations [place];Caring for Others;Carry;Lift;Locomotion Level;Squat;Stairs;Stand;Transfers    Examination-Participation Restrictions Church;Cleaning;Community Activity;Driving;Laundry;Meal Prep;Shop;Volunteer;Yard Work     Merchant navy officer Evolving/Moderate complexity    Clinical Decision Making Moderate    Rehab Potential Good    PT Frequency 2x / week    PT Duration 8 weeks    PT Treatment/Interventions ADLs/Self Care Home Management;Cryotherapy;Moist Heat;Gait training;Stair training;Functional mobility training;Therapeutic activities;Therapeutic exercise;Balance training;Neuromuscular re-education;Patient/family education;Energy conservation  PT Next Visit Plan do 6 min walk, initiate HEP    PT Home Exercise Plan will address next session    Recommended Other Services currently receiving speech for aphasia    Consulted and Agree with Plan of Care Patient           Patient will benefit from skilled therapeutic intervention in order to improve the following deficits and impairments:  Abnormal gait, Decreased balance, Decreased endurance, Decreased mobility, Difficulty walking, Dizziness, Decreased activity tolerance, Decreased coordination, Decreased safety awareness, Decreased strength, Pain  Visit Diagnosis: Muscle weakness (generalized)  Other abnormalities of gait and mobility     Problem List Patient Active Problem List   Diagnosis Date Noted   History of CVA in adulthood 12/07/2019   Gait abnormality 11/12/2019   Tremor, essential 11/12/2019   Health care maintenance 10/04/2018   Thumb pain 10/04/2018   Memory change 12/17/2017   Paresthesia 12/03/2017   Lightheaded 08/28/2017   Encounter for chronic pain management 06/18/2017   Neck pain 05/14/2017   Panic 01/10/2017   Muscle ache 10/05/2015   Syncope 07/08/2015   Chronic back pain 04/12/2015   Medicare annual wellness visit, initial 03/08/2015   Advance care planning 03/08/2015   Dysgeusia 11/28/2014   Anxiety state 10/27/2014   Diabetic polyneuropathy ([place]) 02/14/2014   Rhinitis, chronic 01/13/2012   Basal cell carcinoma    LUMBAR RADICULOPATHY 10/16/2010   HLD (hyperlipidemia)  05/13/2008   Uncontrolled type 2 diabetes mellitus with peripheral neuropathy ([place]) 05/26/2007   Essential hypertension 05/26/2007   SCOLIOSIS 05/26/2007   Tremor 05/26/2007    Kalyssa Anker PT, DPT 05/29/2020, 12:22 PM  [place] [address] [place], [place], [address] Phone: [phone]   Fax:  [phone]  Name: Walter Harris MRN: [id number] Date of Birth: 1946/01/17

## 2020-05-30 ENCOUNTER — Encounter: Payer: Self-pay | Admitting: Speech Pathology

## 2020-05-30 ENCOUNTER — Encounter: Payer: PPO | Admitting: Speech Pathology

## 2020-05-30 NOTE — Progress Notes (Signed)
Agreed.  Thanks.    Physician Documentation  Your signature is required to indicate approval of the treatment plan as stated above. By signing this report, you are approving the plan of care. Please sign and either send electronically or print and fax the signed copy to the number below. If you approve with modifications, please indicate those in the space provided.________________________  Physician Signature: ___Graham Duncan_________  Date:____09/21/21 ___ Time:__5:51 AM ___

## 2020-05-30 NOTE — Therapy (Signed)
[place] [facility] SERVICES [address] [place], [place], [address] Phone: [phone]   Fax:  [phone]  Speech Language Pathology Treatment  Patient Details  Name: Walter Harris MRN: [id number] Date of Birth: 15-Dec-1945 Referring Provider (SLP): Elsie Stain   Encounter Date: 05/29/2020   End of Session - 05/30/20 1740    Visit Number 29    Number of Visits 43    Date for SLP Re-Evaluation 06/20/20    Authorization Type [employer]    Authorization Time Period 03/28/2020 - 06/20/20    Authorization - Visit Number 9    Progress Note Due on Visit 10    SLP Start Time 1000    SLP Stop Time  1100    SLP Time Calculation (min) 60 min    Activity Tolerance Patient tolerated treatment well           Past Medical History:  Diagnosis Date  . Basal cell carcinoma    multiple  . Cataracts, both eyes 2019   surgery pending April 2020  . Complication of anesthesia    post-operative cognitive dysfunction after ACDF on 05/01/11 [facility])  . Deaf    right ear  . Diabetes mellitus   . DJD (degenerative joint disease)    lumbar spine  . Gait abnormality 11/12/2019  . Hearing impaired    hearing aids-transmitter rt   . History of blood transfusion    as a baby in 79  . History of colonic polyps   . Hyperlipidemia   . Hypertension    pt. denies high blood pressure  . Kidney stone 1967  . Memory change 12/17/2017  . Nose fracture    3x  . OCD (obsessive compulsive disorder)   . Stroke [facility])    "mini stroke, that's what gave me the tremors"  . Stuttering   . Tremor, essential   . Vertigo    random  . Wears dentures    full upper  . Wears glasses     Past Surgical History:  Procedure Laterality Date  . APPENDECTOMY  1960  . BACK SURGERY  2012   lumbar surgery  . CATARACT EXTRACTION W/PHACO Right 04/12/2019   Procedure: CATARACT EXTRACTION PHACO AND INTRAOCULAR LENS PLACEMENT ([facility]) RIGHT, DIABETIC;  Surgeon: [name], MD;  Location: [place];  Service: Ophthalmology;  Laterality: Right;  Diabetic - insulin and oral meds  . CATARACT EXTRACTION W/PHACO Left 05/10/2019   Procedure: CATARACT EXTRACTION PHACO AND INTRAOCULAR LENS PLACEMENT ([facility])  LEFT DIABETIC  00:25.6  14.4%  3.75;  Surgeon: [name], MD;  Location: [place];  Service: Ophthalmology;  Laterality: Left;  Diabetic - insulin and oral meds  . CERVICAL FUSION  1990  . CERVICAL FUSION  8/12   multiple levels with plates  . CHOLESTEATOMA EXCISION  1993  . COLLATERAL LIGAMENT REPAIR, ELBOW     bilateral, nerve improvement left 08/04, right 09/04  . COLONOSCOPY    . CYST EXCISION     back of cervical spine total of 5 surgeries  . EXTERNAL EAR SURGERY     multiple ear surguries as a child  . OTHER SURGICAL HISTORY     benign head tumor#5  . OTHER SURGICAL HISTORY     sebaceous cysts-post neck x5  . SHOULDER ARTHROSCOPY W/ ACROMIAL REPAIR  08/2004   left shoulder  . TONSILLECTOMY    . ULNAR NERVE TRANSPOSITION Right 05/13/2013   Procedure: RIGHT ULNAR NERVE  DECOMPRESSION/TRANSPOSITION;  Surgeon: [name]., MD;  Location: [place];  Service: Orthopedics;  Laterality: Right;    There were no vitals filed for this visit.   Subjective Assessment - 05/30/20 1738    Subjective pt continues to express overall frustration with current overall deficits    Patient is accompained by: Family member    Currently in Pain? No/denies                 ADULT SLP TREATMENT - 05/30/20 0001      General Information   Behavior/Cognition Alert;Pleasant mood;Cooperative    HPI Pt is a 74 y.o. 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         Treatment Provided   Treatment provided  Cognitive-Linquistic      Pain Assessment   Pain Assessment No/denies pain      Cognitive-Linquistic Treatment   Treatment focused on Aphasia    Skilled Treatment Strategies for generative naming introduced, prior to the use of strategies, pt able to name 3 to 4 items within each category, with strategies pt able to increase to 12 to 15 items       Assessment / Recommendations / Plan   Plan Continue with current plan of care      Progression Toward Goals   Progression toward goals Progressing toward goals            SLP Education - 05/30/20 1739    Education Details explained how strategies to increase word finding abilities    Person(s) Educated Patient;Spouse    Methods Explanation;Demonstration;Verbal cues;Handout    Comprehension Verbalized understanding              SLP Long Term Goals - 03/28/20 1726      SLP LONG TERM GOAL #1   Title Pt will utilize word retrieval strategies to communicate sentence level simple linguistic tasks with 75% accuracy.    Baseline 25%    Time 12    Period Weeks    Status On-going      SLP LONG TERM GOAL #2   Title Pt will answer basic yes/no questions with 75% accuracy and Minimal cues.    Baseline 25%    Status Achieved            Plan - 05/30/20 1743    Clinical Impression Statement Skilled treatment session targeted pt's word finding deficits. SLP facilitated session introducing strategies to help generate items within a category. Examples provided and with cues, pt able to increase length of list from 3-4 items to 12-15 items. Pt to utilize strategies without moderate multi-modal cues.    Speech Therapy Frequency 2x / week    Duration 12 weeks    Treatment/Interventions Patient/family education;Language facilitation    Potential to Achieve Goals Good    SLP Home Exercise Plan given    Consulted and Agree with Plan of Care Patient;Family member/caregiver    Family Member Consulted wife           Patient will benefit  from skilled therapeutic intervention in order to improve the following deficits and impairments:   Aphasia    Problem List Patient Active Problem List   Diagnosis Date Noted  . History of CVA in adulthood 12/07/2019  . Gait abnormality 11/12/2019  . Tremor, essential 11/12/2019  .  Health care maintenance 10/04/2018  . Thumb pain 10/04/2018  . Memory change 12/17/2017  . Paresthesia 12/03/2017  . Lightheaded 08/28/2017  . Encounter for chronic pain management 06/18/2017  . Neck pain 05/14/2017  . Panic 01/10/2017  . Muscle ache 10/05/2015  . Syncope 07/08/2015  . Chronic back pain 04/12/2015  . Medicare annual wellness visit, initial 03/08/2015  . Advance care planning 03/08/2015  . Dysgeusia 11/28/2014  . Anxiety state 10/27/2014  . Diabetic polyneuropathy ([place]) 02/14/2014  . Rhinitis, chronic 01/13/2012  . Basal cell carcinoma   . LUMBAR RADICULOPATHY 10/16/2010  . HLD (hyperlipidemia) 05/13/2008  . Uncontrolled type 2 diabetes mellitus with peripheral neuropathy ([place]) 05/26/2007  . Essential hypertension 05/26/2007  . SCOLIOSIS 05/26/2007  . Tremor 05/26/2007   Annahi Short B. Rutherford Nail M.S., CCC-SLP, [place] Pathologist Rehabilitation Services Office [phone]  Stormy Fabian 05/30/2020, 5:44 PM  [place] [facility]IN [facility] SERVICES [address] [place], [place], [address] Phone: [phone]   Fax:  [phone]   Name: INEZ ROSATO MRN: [id number] Date of Birth: 08-26-1946

## 2020-05-31 ENCOUNTER — Ambulatory Visit: Payer: PPO | Admitting: Speech Pathology

## 2020-05-31 DIAGNOSIS — E785 Hyperlipidemia, unspecified: Secondary | ICD-10-CM | POA: Diagnosis not present

## 2020-05-31 DIAGNOSIS — E1165 Type 2 diabetes mellitus with hyperglycemia: Secondary | ICD-10-CM | POA: Diagnosis not present

## 2020-05-31 DIAGNOSIS — Z794 Long term (current) use of insulin: Secondary | ICD-10-CM | POA: Diagnosis not present

## 2020-05-31 DIAGNOSIS — E1169 Type 2 diabetes mellitus with other specified complication: Secondary | ICD-10-CM | POA: Diagnosis not present

## 2020-06-01 ENCOUNTER — Encounter: Payer: PPO | Admitting: Speech Pathology

## 2020-06-05 ENCOUNTER — Ambulatory Visit: Payer: PPO | Admitting: Physical Therapy

## 2020-06-05 ENCOUNTER — Ambulatory Visit: Payer: PPO | Admitting: Speech Pathology

## 2020-06-05 ENCOUNTER — Other Ambulatory Visit: Payer: Self-pay | Admitting: Family Medicine

## 2020-06-05 ENCOUNTER — Encounter: Payer: PPO | Admitting: Speech Pathology

## 2020-06-05 NOTE — Telephone Encounter (Signed)
Refill request Plavix Last refill 05/17/20 ? Last office visit 04/25/20

## 2020-06-06 ENCOUNTER — Telehealth: Payer: Self-pay

## 2020-06-06 NOTE — Telephone Encounter (Signed)
Noted. Thanks.

## 2020-06-06 NOTE — Telephone Encounter (Signed)
[facility] - Client TELEPHONE ADVICE RECORD [facility] Patient Name: COLEY KULIKOWSKI Gender: Male DOB: 08/09/1946 Age: 74 Y 26 M Return Phone Number: [phone] (Primary) Address: City/State/Zip: [place] [address] Client [facility] - Client Client Site [facility] Physician Renford Dills - MD Contact Type Call Who Is Calling Patient / Member / Family / Caregiver Call Type Triage / Clinical Caller Name [name] Relationship To Patient Spouse Return Phone Number [phone] (Primary) Chief Complaint Dizziness Reason for Call Symptomatic / Request for Health Information Initial Comment Exteremly dizzy and thinks its medication, has had stroke. several days Translation No Nurse Assessment Nurse: Durene Cal, RN, Brandi Date/Time (Eastern Time): 06/06/2020 10:15:07 AM Confirm and document reason for call. If symptomatic, describe symptoms. ---Exteremly dizzy and thinks its medication, has had stroke. several days Does the patient have any new or worsening symptoms? ---Yes Will a triage be completed? ---Yes Related visit to physician within the last 2 weeks? ---No Does the PT have any chronic conditions? (i.e. diabetes, asthma, this includes High risk factors for pregnancy, etc.) ---Yes List chronic conditions. ---Hx stroke diabetic htn Is this a behavioral health or substance abuse call? ---No Guidelines Guideline Title Affirmed Question Affirmed Notes Nurse Date/Time (Eastern Time) Dizziness - Lightheadedness SEVERE dizziness (e.g., unable to stand, requires support to walk, feels like passing out now) Capac, Therapist, sports, Velna Hatchet 06/06/2020 10:16:26 AM Disp. Time Eilene Ghazi Time) Disposition Final User 06/06/2020 10:19:32 AM Go to ED Now (or PCP triage) Yes Durene Cal, RN, Berdie Ogren Disagree/Comply Comply Caller Understands Yes PLEASE NOTE: All timestamps contained within this report are represented as  [place] Standard Time. CONFIDENTIALTY NOTICE: This fax transmission is intended only for the addressee. It contains information that is legally privileged, confidential or otherwise protected from use or disclosure. If you are not the intended recipient, you are strictly prohibited from reviewing, disclosing, copying using or disseminating any of this information or taking any action in reliance on or regarding this information. If you have received this fax in error, please notify us immediately by telephone so that we can arrange for its return to [place]. Phone: [phone], Toll-Free: [phone], Fax: [phone] Page: 2 of 2 Call Id: [id number] PreDisposition Call Doctor Care Advice Given Per Guideline GO TO ED NOW (OR PCP TRIAGE): ANOTHER ADULT SHOULD DRIVE: * It is better and safer if another adult drives instead of you. Comments User: Ermalene Postin, RN Date/Time Eilene Ghazi Time): 06/06/2020 10:16:13 AM Speech therapy for previous stroke. User: Ermalene Postin, RN Date/Time Eilene Ghazi Time): 06/06/2020 10:17:58 AM AM blood sugar: 96 User: Ermalene Postin, RN Date/Time (Eastern Time): 06/06/2020 10:19:06 AM Cane, walker used due to dizziness. User: Ermalene Postin, RN Date/Time Eilene Ghazi Time): 06/06/2020 10:21:14 AM Backline; no answer. User: Ermalene Postin, RN Date/Time Eilene Ghazi Time): 06/06/2020 10:30:30 AM Caller reports husband takes insulin at night. Reports he did see MD that handles Blood glucose last week. User: Ermalene Postin, RN Date/Time Eilene Ghazi Time): 06/06/2020 10:32:15 AM Waited on hold with back line for greater than 4 mins. Encouraged caller to wait on hold longer for the office rn User: Ermalene Postin, RN Date/Time Eilene Ghazi Time): 06/06/2020 10:32:34 AM REFUSES ED only wants to see Dr. [name] Referrals Warm transfer to backline

## 2020-06-06 NOTE — Telephone Encounter (Signed)
Access nurse spoke with Walter Harris at front desk but hung up before I could pick up call. I spoke with Walter Harris; pt has had continuous dizziness for several days; Pt has had dizziness and was advised UC or ED outcome and pt refuses and wants appt with Walter Harris. No H/A,CP, or SOB. Pt is dizzy and thinks interaction of 11 drugs that pt is taking. pts wife wants appt to see Walter Harris. Has not taken BP; saw Walter Ladell Pier the other day and BP and BS were great. When dizzy pt cannot tell if room is spinning around or not; the dizziness is continuous. No weakness in arms or legs and no slurred speech; no vision changes. Pt has no covid symptoms, no travel and no known exposure to + covid. Pt has had covid vaccines. Walter Harris does not have available appts until 06/13/20; offered sooner appt with another provider but pts wife declined and scheduled appt with Walter Harris on 06/13/20 at 12:30. UC & ED precautions given and pts wife voiced  Understanding. FYI to Walter Harris.

## 2020-06-06 NOTE — Telephone Encounter (Signed)
See below.  I think that in person eval makes sense instead of changing his meds prior to eval.

## 2020-06-06 NOTE — Telephone Encounter (Signed)
Sent. Thanks.   

## 2020-06-07 ENCOUNTER — Ambulatory Visit: Payer: PPO | Admitting: Physical Therapy

## 2020-06-07 ENCOUNTER — Ambulatory Visit: Payer: PPO | Admitting: Speech Pathology

## 2020-06-07 ENCOUNTER — Encounter: Payer: PPO | Admitting: Speech Pathology

## 2020-06-12 ENCOUNTER — Ambulatory Visit: Payer: PPO | Admitting: Speech Pathology

## 2020-06-12 ENCOUNTER — Ambulatory Visit: Payer: PPO | Admitting: Physical Therapy

## 2020-06-12 ENCOUNTER — Encounter: Payer: PPO | Admitting: Speech Pathology

## 2020-06-13 ENCOUNTER — Ambulatory Visit (INDEPENDENT_AMBULATORY_CARE_PROVIDER_SITE_OTHER): Payer: PPO | Admitting: Family Medicine

## 2020-06-13 ENCOUNTER — Encounter: Payer: Self-pay | Admitting: Family Medicine

## 2020-06-13 ENCOUNTER — Other Ambulatory Visit: Payer: Self-pay

## 2020-06-13 DIAGNOSIS — I10 Essential (primary) hypertension: Secondary | ICD-10-CM

## 2020-06-13 MED ORDER — AMLODIPINE BESYLATE 5 MG PO TABS
2.5000 mg | ORAL_TABLET | Freq: Every day | ORAL | Status: DC
Start: 2020-06-13 — End: 2020-08-08

## 2020-06-13 NOTE — Progress Notes (Signed)
This visit occurred during the SARS-CoV-2 public health emergency.  Safety protocols were in place, including screening questions prior to the visit, additional usage of staff PPE, and extensive cleaning of exam room while observing appropriate contact time as indicated for disinfecting solutions.  His mood has been better on higher dose of fluoxetine.  He is off citalopram.  Discussed.  Med list reviewed.  "Dizzy".  He has orthostatic sx, reproduceable getting out of bed but not rolling over in bed.  Laying down helps.  No syncope.  He has been taking amlodipine 5 mg a day.  Also taking irbesartan 150 mg a day and propranolol 60 mg daily.  Meds, vitals, and allergies reviewed.   ROS: Per HPI unless specifically indicated in ROS section   nad ncat Speech baseline.   Neck supple rrr ctab abd soft, not ttp Tremor noted at baseline. No BLE edema.  He does feel slightly orthostatic/lightheaded on standing. Tympanic membranes within normal limits with right tube in place at baseline.  Detailed conversation with patient about plan and medications.  At least 30 minutes were devoted to patient care in this encounter (this can potentially include time spent reviewing the patient's file/history, interviewing and examining the patient, counseling/reviewing plan with patient, ordering referrals, ordering tests, reviewing relevant laboratory or x-ray data, and documenting the encounter).

## 2020-06-13 NOTE — Patient Instructions (Signed)
Cut the amlodipine back to 1/2 tab a day (2.5mg  a day).  See if that helps with the lightheadedness.  Update me as needed.  You may have to stop amlodipine totally after 1 week.  Take care.  Glad to see you.

## 2020-06-14 ENCOUNTER — Other Ambulatory Visit: Payer: Self-pay | Admitting: Family Medicine

## 2020-06-14 ENCOUNTER — Ambulatory Visit: Payer: PPO | Admitting: Speech Pathology

## 2020-06-14 ENCOUNTER — Encounter: Payer: Self-pay | Admitting: Physical Therapy

## 2020-06-14 ENCOUNTER — Ambulatory Visit: Payer: PPO | Admitting: Physical Therapy

## 2020-06-14 DIAGNOSIS — M6281 Muscle weakness (generalized): Secondary | ICD-10-CM

## 2020-06-14 DIAGNOSIS — R278 Other lack of coordination: Secondary | ICD-10-CM

## 2020-06-14 DIAGNOSIS — R2689 Other abnormalities of gait and mobility: Secondary | ICD-10-CM

## 2020-06-14 NOTE — Assessment & Plan Note (Signed)
Likely overtreated, now with reproducible orthostatic symptoms.  Discussed options. Cut amlodipine back to 1/2 tab a day (2.5mg  a day).  See if that helps with the lightheadedness.  Update me as needed.  He may have to stop amlodipine totally after 1 week.  Okay for outpatient follow-up.

## 2020-06-14 NOTE — Therapy (Signed)
[place] [facility] [address] [place], [place], [address] Phone: [phone]   Fax:  [phone]  June 14, 2020   No Recipients  Physical Therapy Discharge Summary  Patient: Walter Harris  MRN: [id number]  Date of Birth: 1946/07/30   Diagnosis: Muscle weakness (generalized)  Other abnormalities of gait and mobility  Other lack of coordination Referring Provider (PT): Elsie Stain MD   The above patient had been seen in Physical Therapy 1 times of 16 treatments scheduled with 0 no shows and 15 cancellations.  The treatment consisted of evaluation only The patient is: Unchanged  Subjective: Pt called and cancelled all PT appointments after therapy evaluation stating, "I just don't think that I can manage PT. I'm worried my back will hurt more and its just too much."   Discharge Findings: see evaluation for latest objective data  Functional Status at Discharge: no change  No Goals Met    Sincerely,   Geovana Gebel, PT, DPT   CC No Recipients  [place] [address] [place], [place], [address] Phone: [phone]   Fax:  [phone]  Patient: Walter Harris  MRN: [id number]  Date of Birth: Jan 24, 1946

## 2020-06-15 ENCOUNTER — Encounter: Payer: PPO | Admitting: Speech Pathology

## 2020-06-15 NOTE — Telephone Encounter (Signed)
Electronic refill request. Trazodone Last office visit:   06/13/2020 Last Filled:   11/17/2019    #135   1 RF

## 2020-06-16 NOTE — Telephone Encounter (Signed)
Sent. Thanks.   

## 2020-06-19 ENCOUNTER — Encounter: Payer: PPO | Admitting: Speech Pathology

## 2020-06-19 ENCOUNTER — Ambulatory Visit: Payer: PPO | Admitting: Speech Pathology

## 2020-06-19 ENCOUNTER — Ambulatory Visit: Payer: PPO | Admitting: Physical Therapy

## 2020-06-21 ENCOUNTER — Encounter: Payer: PPO | Admitting: Speech Pathology

## 2020-06-21 ENCOUNTER — Ambulatory Visit: Payer: PPO | Admitting: Speech Pathology

## 2020-06-21 ENCOUNTER — Ambulatory Visit: Payer: PPO | Admitting: Physical Therapy

## 2020-06-26 ENCOUNTER — Ambulatory Visit: Payer: PPO | Admitting: Physical Therapy

## 2020-06-26 ENCOUNTER — Ambulatory Visit: Payer: PPO | Admitting: Speech Pathology

## 2020-06-26 ENCOUNTER — Encounter: Payer: PPO | Admitting: Speech Pathology

## 2020-06-28 ENCOUNTER — Ambulatory Visit: Payer: PPO | Admitting: Speech Pathology

## 2020-06-28 ENCOUNTER — Other Ambulatory Visit: Payer: Self-pay | Admitting: Family Medicine

## 2020-06-28 ENCOUNTER — Ambulatory Visit: Payer: PPO | Admitting: Physical Therapy

## 2020-06-28 ENCOUNTER — Encounter: Payer: PPO | Admitting: Speech Pathology

## 2020-06-29 DIAGNOSIS — Z08 Encounter for follow-up examination after completed treatment for malignant neoplasm: Secondary | ICD-10-CM | POA: Diagnosis not present

## 2020-06-29 DIAGNOSIS — X32XXXA Exposure to sunlight, initial encounter: Secondary | ICD-10-CM | POA: Diagnosis not present

## 2020-06-29 DIAGNOSIS — L570 Actinic keratosis: Secondary | ICD-10-CM | POA: Diagnosis not present

## 2020-06-29 DIAGNOSIS — Z85828 Personal history of other malignant neoplasm of skin: Secondary | ICD-10-CM | POA: Diagnosis not present

## 2020-06-29 DIAGNOSIS — D225 Melanocytic nevi of trunk: Secondary | ICD-10-CM | POA: Diagnosis not present

## 2020-06-29 DIAGNOSIS — L821 Other seborrheic keratosis: Secondary | ICD-10-CM | POA: Diagnosis not present

## 2020-07-03 ENCOUNTER — Ambulatory Visit: Payer: PPO | Admitting: Speech Pathology

## 2020-07-03 ENCOUNTER — Ambulatory Visit: Payer: PPO | Admitting: Physical Therapy

## 2020-07-03 ENCOUNTER — Encounter: Payer: PPO | Admitting: Speech Pathology

## 2020-07-05 ENCOUNTER — Ambulatory Visit: Payer: PPO | Admitting: Physical Therapy

## 2020-07-05 ENCOUNTER — Encounter: Payer: PPO | Admitting: Speech Pathology

## 2020-07-06 DIAGNOSIS — Z961 Presence of intraocular lens: Secondary | ICD-10-CM | POA: Diagnosis not present

## 2020-07-06 LAB — HM DIABETES EYE EXAM

## 2020-07-11 ENCOUNTER — Telehealth: Payer: Self-pay | Admitting: Family Medicine

## 2020-07-11 NOTE — Telephone Encounter (Signed)
Larena Glassman @ [facility] From Dr Harrel Lemon    last time pt saw dr Edwina Barth 03/30/20 and spouse called 7/27 requesting referral for PT.   She stated it looks like pt was seeing dr Edwina Barth as PCP and also dr [name]  She is taking pt off Dr Edwina Barth as PCP. And making a note that dr [name] is pt PCP

## 2020-07-11 NOTE — Telephone Encounter (Signed)
Noted. Thanks.

## 2020-07-12 ENCOUNTER — Encounter: Payer: PPO | Admitting: Speech Pathology

## 2020-07-14 ENCOUNTER — Encounter: Payer: PPO | Admitting: Speech Pathology

## 2020-07-17 ENCOUNTER — Encounter: Payer: PPO | Admitting: Speech Pathology

## 2020-07-19 ENCOUNTER — Encounter: Payer: PPO | Admitting: Speech Pathology

## 2020-07-20 ENCOUNTER — Telehealth: Payer: Self-pay | Admitting: Family Medicine

## 2020-07-20 NOTE — Chronic Care Management (AMB) (Signed)
  Chronic Care Management   Note  07/20/2020 Name: LARREN COPES MRN: [id number] DOB: [birth year]  JAIDEEP POLLACK is a 74 y.o. year old male who is a primary care patient of [name], MD. I reached out to [employer] by phone today in response to a referral sent by Mr. Vince Ainsley Lamarque's PCP, [name], MD.   Mr. Wollard was given information about Chronic Care Management services today including:  1. CCM service includes personalized support from designated clinical staff supervised by his physician, including individualized plan of care and coordination with other care providers 2. 24/7 contact phone numbers for assistance for urgent and routine care needs. 3. Service will only be billed when office clinical staff spend 20 minutes or more in a month to coordinate care. 4. Only one practitioner may furnish and bill the service in a calendar month. 5. The patient may stop CCM services at any time (effective at the end of the month) by phone call to the office staff.   [name] verbally agreed to assistance and services provided by embedded care coordination/care management team today.  Follow up plan:   [place]

## 2020-07-21 ENCOUNTER — Encounter: Payer: Self-pay | Admitting: Family Medicine

## 2020-07-21 ENCOUNTER — Encounter: Payer: PPO | Admitting: Speech Pathology

## 2020-07-24 ENCOUNTER — Encounter: Payer: PPO | Admitting: Speech Pathology

## 2020-07-26 ENCOUNTER — Encounter: Payer: PPO | Admitting: Speech Pathology

## 2020-07-26 ENCOUNTER — Other Ambulatory Visit: Payer: Self-pay | Admitting: Family Medicine

## 2020-07-26 NOTE — Telephone Encounter (Signed)
Sent. Thanks.   

## 2020-07-26 NOTE — Telephone Encounter (Signed)
Pharmacy requests refill on: BD pen needle nano U/F [id number]  LAST REFILL: 06/08/2018 LAST OV: 06/13/2020 NEXT OV: Not Scheduled  PHARMACY: [facility] [place], [place]  Medication not on list.

## 2020-07-31 ENCOUNTER — Encounter: Payer: Self-pay | Admitting: Emergency Medicine

## 2020-07-31 ENCOUNTER — Emergency Department
Admission: EM | Admit: 2020-07-31 | Discharge: 2020-07-31 | Disposition: A | Discharge status: Home / Self Care | Payer: PPO | Attending: Emergency Medicine | Admitting: Emergency Medicine

## 2020-07-31 ENCOUNTER — Emergency Department: Payer: PPO

## 2020-07-31 ENCOUNTER — Encounter: Payer: PPO | Admitting: Speech Pathology

## 2020-07-31 DIAGNOSIS — Z79899 Other long term (current) drug therapy: Secondary | ICD-10-CM | POA: Diagnosis not present

## 2020-07-31 DIAGNOSIS — R930 Abnormal findings on diagnostic imaging of skull and head, not elsewhere classified: Secondary | ICD-10-CM | POA: Insufficient documentation

## 2020-07-31 DIAGNOSIS — W19XXXA Unspecified fall, initial encounter: Secondary | ICD-10-CM

## 2020-07-31 DIAGNOSIS — M25562 Pain in left knee: Secondary | ICD-10-CM | POA: Diagnosis not present

## 2020-07-31 DIAGNOSIS — M25561 Pain in right knee: Secondary | ICD-10-CM | POA: Diagnosis not present

## 2020-07-31 DIAGNOSIS — S40012A Contusion of left shoulder, initial encounter: Secondary | ICD-10-CM

## 2020-07-31 DIAGNOSIS — E1140 Type 2 diabetes mellitus with diabetic neuropathy, unspecified: Secondary | ICD-10-CM | POA: Diagnosis not present

## 2020-07-31 DIAGNOSIS — R079 Chest pain, unspecified: Secondary | ICD-10-CM | POA: Diagnosis not present

## 2020-07-31 DIAGNOSIS — Z7982 Long term (current) use of aspirin: Secondary | ICD-10-CM | POA: Insufficient documentation

## 2020-07-31 DIAGNOSIS — S4992XA Unspecified injury of left shoulder and upper arm, initial encounter: Secondary | ICD-10-CM | POA: Diagnosis present

## 2020-07-31 DIAGNOSIS — M25519 Pain in unspecified shoulder: Secondary | ICD-10-CM | POA: Diagnosis not present

## 2020-07-31 DIAGNOSIS — I10 Essential (primary) hypertension: Secondary | ICD-10-CM | POA: Diagnosis not present

## 2020-07-31 DIAGNOSIS — S022XXA Fracture of nasal bones, initial encounter for closed fracture: Secondary | ICD-10-CM | POA: Diagnosis not present

## 2020-07-31 DIAGNOSIS — Z7984 Long term (current) use of oral hypoglycemic drugs: Secondary | ICD-10-CM | POA: Insufficient documentation

## 2020-07-31 DIAGNOSIS — Z87891 Personal history of nicotine dependence: Secondary | ICD-10-CM | POA: Insufficient documentation

## 2020-07-31 DIAGNOSIS — M25512 Pain in left shoulder: Secondary | ICD-10-CM | POA: Diagnosis not present

## 2020-07-31 DIAGNOSIS — Z794 Long term (current) use of insulin: Secondary | ICD-10-CM | POA: Diagnosis not present

## 2020-07-31 DIAGNOSIS — Z7901 Long term (current) use of anticoagulants: Secondary | ICD-10-CM | POA: Insufficient documentation

## 2020-07-31 DIAGNOSIS — W010XXA Fall on same level from slipping, tripping and stumbling without subsequent striking against object, initial encounter: Secondary | ICD-10-CM | POA: Diagnosis not present

## 2020-07-31 DIAGNOSIS — Z043 Encounter for examination and observation following other accident: Secondary | ICD-10-CM | POA: Diagnosis not present

## 2020-07-31 DIAGNOSIS — R52 Pain, unspecified: Secondary | ICD-10-CM | POA: Diagnosis not present

## 2020-07-31 MED ORDER — LIDOCAINE 5 % EX PTCH
1.0000 | MEDICATED_PATCH | CUTANEOUS | Status: DC
  Administered 2020-07-31: 1 via TRANSDERMAL
  Filled 2020-07-31: qty 1

## 2020-07-31 MED ORDER — ACETAMINOPHEN 500 MG PO TABS
1000.0000 mg | ORAL_TABLET | Freq: Once | ORAL | Status: AC
  Administered 2020-07-31: 1000 mg via ORAL
  Filled 2020-07-31: qty 2

## 2020-07-31 MED ORDER — OXYCODONE HCL 5 MG PO TABS
5.0000 mg | ORAL_TABLET | Freq: Once | ORAL | Status: AC
  Administered 2020-07-31: 5 mg via ORAL
  Filled 2020-07-31: qty 1

## 2020-07-31 NOTE — ED Provider Notes (Signed)
[facility] Emergency Department Provider Note  ____________________________________________   First MD Initiated Contact with Patient 07/31/20 [phone]     (approximate)  I have reviewed the triage vital signs and the nursing notes.   HISTORY  Chief Complaint Fall   HPI Walter Harris is a 74 y.o. male with a past medical history of DJD, HTN, HDL, essential tremor, and CVA without residual deficits, chronic gait abnormality and chronic back pain and daily opiates who presents accompanied by his wife for assessment after he states he fell yesterday.  Patient states he was out to check his mailbox yesterday when he tripped and fell.  He states he hit the front of his face and left shoulder and both knees.  He did not lose consciousness and since the fall has only had new pain in his left shoulder.  He states he is otherwise in his usual state health without any recent fevers, chills, cough, vomiting, diarrhea, dysuria, rash, or other recent falls or injuries.  Patient is on aspirin Plavix but is not on anticoagulant.         Past Medical History:  Diagnosis Date  . Basal cell carcinoma    multiple  . Cataracts, both eyes 2019   surgery pending April 2020  . Complication of anesthesia    post-operative cognitive dysfunction after ACDF on 05/01/11 [facility])  . Deaf    right ear  . Diabetes mellitus   . DJD (degenerative joint disease)    lumbar spine  . Gait abnormality 11/12/2019  . Hearing impaired    hearing aids-transmitter rt   . History of blood transfusion    as a baby in 54  . History of colonic polyps   . Hyperlipidemia   . Hypertension    pt. denies high blood pressure  . Kidney stone 1967  . Memory change 12/17/2017  . Nose fracture    3x  . OCD (obsessive compulsive disorder)   . Stroke [facility])    "mini stroke, that's what gave me the tremors"  . Stuttering   . Tremor, essential   . Vertigo    random  . Wears dentures    full upper  .  Wears glasses     Patient Active Problem List   Diagnosis Date Noted  . History of CVA in adulthood 12/07/2019  . Gait abnormality 11/12/2019  . Tremor, essential 11/12/2019  . Health care maintenance 10/04/2018  . Thumb pain 10/04/2018  . Memory change 12/17/2017  . Paresthesia 12/03/2017  . Lightheaded 08/28/2017  . Encounter for chronic pain management 06/18/2017  . Neck pain 05/14/2017  . Panic 01/10/2017  . Muscle ache 10/05/2015  . Syncope 07/08/2015  . Chronic back pain 04/12/2015  . Medicare annual wellness visit, initial 03/08/2015  . Advance care planning 03/08/2015  . Dysgeusia 11/28/2014  . Anxiety state 10/27/2014  . Diabetic polyneuropathy ([place]) 02/14/2014  . Rhinitis, chronic 01/13/2012  . Basal cell carcinoma   . LUMBAR RADICULOPATHY 10/16/2010  . HLD (hyperlipidemia) 05/13/2008  . Uncontrolled type 2 diabetes mellitus with peripheral neuropathy ([place]) 05/26/2007  . Essential hypertension 05/26/2007  . SCOLIOSIS 05/26/2007  . Tremor 05/26/2007    Past Surgical History:  Procedure Laterality Date  . APPENDECTOMY  1960  . BACK SURGERY  2012   lumbar surgery  . CATARACT EXTRACTION W/PHACO Right 04/12/2019   Procedure: CATARACT EXTRACTION PHACO AND INTRAOCULAR LENS PLACEMENT (IOC) RIGHT, DIABETIC;  Surgeon: [name], MD;  Location: [place];  Service: Ophthalmology;  Laterality: Right;  Diabetic - insulin and oral meds  . CATARACT EXTRACTION W/PHACO Left 05/10/2019   Procedure: CATARACT EXTRACTION PHACO AND INTRAOCULAR LENS PLACEMENT (IOC)  LEFT DIABETIC  00:25.6  14.4%  3.75;  Surgeon: [name], MD;  Location: [place];  Service: Ophthalmology;  Laterality: Left;  Diabetic - insulin and oral meds  . CERVICAL FUSION  1990  . CERVICAL FUSION  8/12   multiple levels with plates  . CHOLESTEATOMA EXCISION  1993  . COLLATERAL LIGAMENT REPAIR, ELBOW     bilateral, nerve improvement left 08/04, right 09/04  . COLONOSCOPY      . CYST EXCISION     back of cervical spine total of 5 surgeries  . EXTERNAL EAR SURGERY     multiple ear surguries as a child  . OTHER SURGICAL HISTORY     benign head tumor#5  . OTHER SURGICAL HISTORY     sebaceous cysts-post neck x5  . SHOULDER ARTHROSCOPY W/ ACROMIAL REPAIR  08/2004   left shoulder  . TONSILLECTOMY    . ULNAR NERVE TRANSPOSITION Right 05/13/2013   Procedure: RIGHT ULNAR NERVE DECOMPRESSION/TRANSPOSITION;  Surgeon: [name]., MD;  Location: [place];  Service: Orthopedics;  Laterality: Right;    Prior to Admission medications   Medication Sig Start Date End Date Taking? Authorizing Provider  ALPRAZolam Duanne Moron) 0.5 MG tablet TAKE 1/2 TO 1 TABLET TWICE DAILY AS NEEDED FOR ANXIETY 05/24/20   [name], MD  amLODipine (NORVASC) 5 MG tablet Take 0.5 tablets (2.5 mg total) by mouth daily. For blood pressure 06/13/20   [name], MD  aspirin 81 MG EC tablet Take 1 tablet (81 mg total) by mouth daily. For stroke prevention 05/17/20   [name], MD  BD PEN NEEDLE NANO U/F 32G X 4 MM MISC USE AS DIRECTED 07/26/20   [name], MD  canagliflozin [facility]) 300 MG TABS tablet Take 1 tablet (300 mg total) by mouth daily before breakfast. For diabetes 05/17/20   [name], MD  clopidogrel (PLAVIX) 75 MG tablet TAKE 1 TABLET BY MOUTH DAILY 06/06/20   [name], MD  FLUoxetine (PROZAC) 10 MG capsule Take 3 capsules (30 mg total) by mouth daily. For mood. 05/17/20   [name], MD  glipiZIDE (GLUCOTROL XL) 10 MG 24 hr tablet Take 1 tablet (10 mg total) by mouth daily with breakfast. For diabetes 05/17/20   [name], MD  insulin glargine (LANTUS SOLOSTAR) 100 UNIT/ML Solostar Pen Inject 19 Units into the skin daily. For diabetes 05/17/20   [name], MD  irbesartan (AVAPRO) 150 MG tablet Take 1 tablet (150 mg total) by mouth daily. For blood pressure 05/17/20   [name], MD  metFORMIN (GLUCOPHAGE-XR) 500 MG  24 hr tablet TAKE ONE TABLET 3 TIMES DAILY for diabetes 05/17/20   [name], MD  oxyCODONE-acetaminophen (PERCOCET/ROXICET) 5-325 MG tablet TAKE ONE TO TWO TABLETS BY MOUTH 3 TIMES DAILY IF NEEDED FOR PAIN. Fill on/after 07/10/20 04/25/20   [name], MD  oxyCODONE-acetaminophen (PERCOCET/ROXICET) 5-325 MG tablet TAKE ONE TO TWO TABLETS BY MOUTH 3 TIMES DAILY IF NEEDED FOR PAIN. Fill on/after 06/10/20 04/25/20   [name], MD  oxyCODONE-acetaminophen (PERCOCET/ROXICET) 5-325 MG tablet TAKE ONE TO TWO TABLETS BY MOUTH 3 TIMES DAILY IF NEEDED FOR PAIN. Fill on/after 05/11/20 04/25/20   [name], MD  primidone (MYSOLINE) 50 MG tablet  Take 1 tablet (50 mg total) by mouth daily. For tremor 05/17/20   [name], MD  propranolol ER (INDERAL LA) 60 MG 24 hr capsule TAKE 1 CAPSULE EVERY DAY 06/28/20   [name], MD  tiZANidine (ZANAFLEX) 4 MG tablet TAKE ONE TABLET 3 TIMES DAILY AS NEEDED FOR MUSCLE SPASM MAY TAKE 1 EXTRADOSE DAILY AS NEEDED 05/17/20   [name], MD  traZODone (DESYREL) 50 MG tablet TAKE 1 AND 1/2 TABLETS BY MOUTH AT BEDTIME AS NEEDED FOR SLEEP 06/16/20   [name], MD    Allergies Bee venom, Pravastatin, Ace inhibitors, Actos [pioglitazone], Angiotensin receptor blockers, Aripiprazole, Crestor [rosuvastatin calcium], Metformin and related, Olanzapine, Pneumovax 23 [pneumococcal vac polyvalent], Primidone, Codeine, Divalproex sodium, Lithium carbonate, and Sulfamethoxazole-trimethoprim  Family History  Problem Relation Age of Onset  . OCD Mother   . Bipolar disorder Mother   . Cancer Mother   . 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 Arthritis Sister   . OCD Sister   . Colon cancer Neg Hx   . Prostate cancer Neg Hx     Social History Social History   Tobacco Use  . Smoking status: Former Smoker    Packs/day: 0.50    Years: 40.00    Pack years: 20.00    Types: Pipe    Quit date: 06/09/2016    Years since  quitting: 4.1  . Smokeless tobacco: Never Used  Vaping Use  . Vaping Use: Never used  Substance Use Topics  . Alcohol use: No    Alcohol/week: 0.0 standard drinks  . Drug use: No    Review of Systems  Review of Systems  Constitutional: Negative for chills and fever.  HENT: Negative for sore throat.   Eyes: Negative for pain.  Respiratory: Negative for cough and stridor.   Cardiovascular: Negative for chest pain.  Gastrointestinal: Negative for vomiting.  Musculoskeletal: Positive for joint pain ( L shoulder).  Skin: Negative for rash.  Neurological: Positive for tremors. Negative for seizures, loss of consciousness and headaches.  Psychiatric/Behavioral: Negative for suicidal ideas.  All other systems reviewed and are negative.     ____________________________________________   PHYSICAL EXAM:  VITAL SIGNS: ED Triage Vitals [07/31/20 0343]  Enc Vitals Group     BP (!) 154/80     Pulse Rate 80     Resp 18     Temp 98.1 F (36.7 C)     Temp Source Oral     SpO2 98 %     Weight      Height      Head Circumference      Peak Flow      Pain Score      Pain Loc      Pain Edu?      Excl. in [place]?    Vitals:   07/31/20 0343 07/31/20 0623  BP: (!) 154/80 131/75  Pulse: 80 71  Resp: 18 20  Temp: 98.1 F (36.7 C) 98.2 F (36.8 C)  SpO2: 98% 99%   Physical Exam Vitals and nursing note reviewed.  Constitutional:      Appearance: He is well-developed.  HENT:     Head: Normocephalic and atraumatic.     Right Ear: External ear normal.     Left Ear: External ear normal.     Nose: Nose normal.  Eyes:     Conjunctiva/sclera: Conjunctivae normal.  Cardiovascular:  Rate and Rhythm: Normal rate and regular rhythm.     Heart sounds: No murmur heard.   Pulmonary:     Effort: Pulmonary effort is normal. No respiratory distress.     Breath sounds: Normal breath sounds.  Abdominal:     Palpations: Abdomen is soft.     Tenderness: There is no abdominal tenderness.    Musculoskeletal:     Cervical back: Neck supple.  Skin:    General: Skin is warm and dry.     Capillary Refill: Capillary refill takes less than 2 seconds.  Neurological:     Mental Status: He is alert and oriented to person, place, and time.     Motor: Tremor present.  Psychiatric:        Mood and Affect: Mood normal.     Cranial nerves II to XII grossly intact.  Is some ecchymosis of the forehead and left cheek.  Patient is symmetric strength in his bilateral upper and lower extremities.  Sensation is intact in the distribution of the radial ulnar median nerves bilaterally in upper extremities.  2+ bilateral radial pulses.  No step-offs tenderness or deformities over the C/T/L-spine.  Patient has no effusion or deformity at the left shoulder but does have some tenderness on palpation of the anterior and lateral aspects.  He also has some slight limitation range of motion.  He has large motion strength and no effusions or deformities at the bilateral elbows and wrists.  There are some abrasions over the bilateral patella but no large effusion limitation range of motion or decrease in strength.  No other evidence of trauma over the face head ears scalp head or neck. ____________________________________________   LABS (all labs ordered are listed, but only abnormal results are displayed)  Labs Reviewed - No data to display ____________________________________________   ____________________________________________  RADIOLOGY  ED MD interpretation: X-ray of the left shoulder shows degenerative changes without evidence of acute fracture or dislocation. No evidence of fracture dislocation or other acute injury on plain films of the patient's bilateral knees and no evidence of rib fracture or pneumothorax on chest x-ray. CT head shows remote CVA without evidence of intracranial hemorrhage or cardial skull fracture. Patient has a remote nasal bone fracture but no evidence of acute fracture or  other acute orthopedic injury of the face or C-spine.  Official radiology report(s): DG Chest 1 View  Result Date: 07/31/2020 CLINICAL DATA:  Bilateral knee and chest pain. Fall from standing position. EXAM: CHEST  1 VIEW COMPARISON:  Two-view chest x-ray 12/06/2019 FINDINGS: Heart size is exaggerated by low lung volumes. No edema or effusion is present. No focal airspace disease is present. Surgical changes are noted in the cervical spine. Axial skeleton is otherwise unremarkable. IMPRESSION: 1. Low lung volumes. 2. No acute cardiopulmonary disease. Electronically Signed   By: San Morelle M.D.   On: 07/31/2020 06:01   DG Knee 2 Views Left  Result Date: 07/31/2020 CLINICAL DATA:  Fall.  Bilateral knee pain. EXAM: LEFT KNEE - 1-2 VIEW COMPARISON:  Right knee radiographs 07/31/20. FINDINGS: The knee is located. No significant effusion is present. Mild degenerative changes are noted in the patellofemoral compartment. Vascular calcifications are present. IMPRESSION: 1. No acute abnormality. 2. Mild degenerative changes. 3. Atherosclerosis. Electronically Signed   By: San Morelle M.D.   On: 07/31/2020 06:02   DG Knee 2 Views Right  Result Date: 07/31/2020 CLINICAL DATA:  Fall.  Bilateral knee pain. EXAM: RIGHT KNEE - 1-2 VIEW COMPARISON:  Left knee radiographs 08/01/2019 FINDINGS: The knee is located. No significant effusion is present. Vascular calcifications are noted. No acute or healing fractures are present. IMPRESSION: 1. No acute or healing fracture. 2. Atherosclerosis. Electronically Signed   By: San Morelle M.D.   On: 07/31/2020 06:03   CT Head Wo Contrast  Result Date: 07/31/2020 CLINICAL DATA:  Fall yesterday.  Left shoulder pain. EXAM: CT HEAD WITHOUT CONTRAST TECHNIQUE: Contiguous axial images were obtained from the base of the skull through the vertex without intravenous contrast. COMPARISON:  MR head without contrast 11/27/2019 FINDINGS: Brain: Remote left  frontal lobe infarct again noted with associated white matter change and focal ex vacuo dilation the left lateral ventricle. No acute infarct, hemorrhage, or mass lesion is present. No significant extraaxial fluid collection is present. Moderate generalized atrophy is present. Proportionate dilation of the lateral ventricles is noted. The brainstem and cerebellum are within normal limits. Vascular: Atherosclerotic changes are present within the cavernous internal carotid arteries. No hyperdense vessel is present. Skull: Calvarium is intact. No focal lytic or blastic lesions are present. No significant extracranial soft tissue lesion is present. Sinuses/Orbits: Right mastoidectomy noted. Left mastoid air cells are clear. Paranasal sinuses are clear. Remote nasal bone fractures are noted. IMPRESSION: 1. No acute intracranial abnormality or significant interval change. 2. Stable remote left frontal lobe infarct. 3. Moderate generalized atrophy and white matter disease. This likely reflects the sequela of chronic microvascular ischemia. Electronically Signed   By: San Morelle M.D.   On: 07/31/2020 06:07   CT Cervical Spine Wo Contrast  Result Date: 07/31/2020 CLINICAL DATA:  Fall yesterday. EXAM: CT CERVICAL SPINE WITHOUT CONTRAST TECHNIQUE: Multidetector CT imaging of the cervical spine was performed without intravenous contrast. Multiplanar CT image reconstructions were also generated. COMPARISON:  Cervical spine MRI/23/12 FINDINGS: Alignment: No significant listhesis is present. Normal cervical lordosis is present. Skull base and vertebrae: Degenerative changes are present C1-2. Craniocervical junction is normal. Vertebral body heights are maintained. No acute fractures are present. Soft tissues and spinal canal: No prevertebral fluid or swelling. No visible canal hematoma. Disc levels: Solid anterior fusion is present at C4-5, C5-6, and C6-7. Moderate bilateral foraminal narrowing at C3-4 is secondary  to uncovertebral and facet disease. Moderate left foraminal narrowing present at C5-6 secondary to residual uncovertebral and facet disease. Severe right and moderate left foraminal narrowing is present at C6-7 due to disease. Upper chest: The lung apices are clear. Thoracic inlet is within normal limits. IMPRESSION: 1. No acute fracture or traumatic subluxation. 2. Solid anterior fusion at C4-5, C5-6, and C6-7. 3. Multilevel degenerative changes of the cervical spine as described. Electronically Signed   By: San Morelle M.D.   On: 07/31/2020 06:10   DG Shoulder Left  Result Date: 07/31/2020 CLINICAL DATA:  Fall.  Left shoulder pain. EXAM: LEFT SHOULDER - 2+ VIEW COMPARISON:  Two-view chest x-ray 12/06/2019 FINDINGS: Left shoulder is located. Degenerative changes are evident and stable. Visualized hemithorax is clear. Lung volumes are low. Surgical changes are noted in the cervical spine. IMPRESSION: 1. Stable degenerative changes of the left shoulder. 2. No acute abnormality. Electronically Signed   By: San Morelle M.D.   On: 07/31/2020 04:14   CT Maxillofacial Wo Contrast  Result Date: 07/31/2020 CLINICAL DATA:  Fall.  Left shoulder pain. EXAM: CT MAXILLOFACIAL WITHOUT CONTRAST TECHNIQUE: Multidetector CT imaging of the maxillofacial structures was performed. Multiplanar CT image reconstructions were also generated. COMPARISON:  CT head without contrast 12/06/2019 FINDINGS: Osseous: Anterior left  nasal bone fractures are remote without associated soft tissue injury. No other acute or remote fractures are present. No focal lytic blastic lesions are present. The mandible is intact and located. No residual maxillary teeth are present. Orbits: Bilateral lens replacements are present. Globes and orbits are otherwise within normal limits. Sinuses: Paranasal sinuses are clear. Left mastoid air cells are clear. Right mastoidectomy is noted. Soft tissues: No significant soft tissue injury is  present over the face. Limited intracranial: Moderate generalized atrophy. Remote left MCA infarct. IMPRESSION: 1. Anterior left nasal bone fractures are remote without associated soft tissue injury. 2. No other acute or remote fractures. 3. Moderate generalized atrophy. 4. Remote left MCA infarct. Electronically Signed   By: San Morelle M.D.   On: 07/31/2020 06:13    ____________________________________________   PROCEDURES  Procedure(s) performed (including Critical Care):  Procedures   ____________________________________________   INITIAL IMPRESSION / ASSESSMENT AND PLAN / ED COURSE        Patient presents with [place] to history exam for assessment of persistent left shoulder pain after prolonged mechanical fall described above that occurred yesterday.  On arrival patient is afebrile and hemodynamically stable.  Exam as above remarkable for some tenderness of the left shoulder abrasions over both lower knees.  Given patient's age history of facial trauma and being on dual antiplatelet therapy CTs were obtained of the head and neck face.  Plain film of shoulder shows no evidence of fracture dislocation.  No evidence of acute intracranial hemorrhage or fracture dislocation above imaging. Low suspicion for occult orthopedic injury or significant visceral injury at this time. Patient is contusion of left shoulder with possible injury to shoulder ligaments. He was given below noted analgesia and said he felt little bit better on my reassessment. Given stable vital signs otherwise reassuring exam and work-up neurovascular intact throughout the left upper extremity and with his safer discharge plan for close outpatient PCP follow-up. Discharge stable condition. Strict precautions advised discussed. ____________________________________________   FINAL CLINICAL IMPRESSION(S) / ED DIAGNOSES  Final diagnoses:  Contusion of left shoulder, initial encounter  Fall, initial encounter     Medications  lidocaine (LIDODERM) 5 % 1 patch (1 patch Transdermal Patch Applied 07/31/20 0618)  acetaminophen (TYLENOL) tablet 1,000 mg (1,000 mg Oral Given 07/31/20 0617)  oxyCODONE (Oxy IR/ROXICODONE) immediate release tablet 5 mg (5 mg Oral Given 07/31/20 0617)     ED Discharge Orders    None       Note:  This document was prepared using Dragon voice recognition software and may include unintentional dictation errors.   Lucrezia Starch, MD 07/31/20 [phone]

## 2020-07-31 NOTE — ED Triage Notes (Signed)
Pt arrived via EMS from home where pt called out due to left shoulder pain. Per pt, he tripped and fell while going to mailbox on 11/21 and since has had pain to the left shoulder. Pt denies LOC ad hitting head. No obvious deformity noted.

## 2020-08-02 ENCOUNTER — Encounter: Payer: PPO | Admitting: Speech Pathology

## 2020-08-07 ENCOUNTER — Encounter: Payer: PPO | Admitting: Speech Pathology

## 2020-08-08 ENCOUNTER — Encounter: Payer: Self-pay | Admitting: Family Medicine

## 2020-08-08 ENCOUNTER — Ambulatory Visit (INDEPENDENT_AMBULATORY_CARE_PROVIDER_SITE_OTHER): Payer: PPO | Admitting: Family Medicine

## 2020-08-08 ENCOUNTER — Other Ambulatory Visit: Payer: Self-pay

## 2020-08-08 DIAGNOSIS — F411 Generalized anxiety disorder: Secondary | ICD-10-CM

## 2020-08-08 DIAGNOSIS — W19XXXD Unspecified fall, subsequent encounter: Secondary | ICD-10-CM

## 2020-08-08 DIAGNOSIS — G8929 Other chronic pain: Secondary | ICD-10-CM | POA: Diagnosis not present

## 2020-08-08 DIAGNOSIS — Y92009 Unspecified place in unspecified non-institutional (private) residence as the place of occurrence of the external cause: Secondary | ICD-10-CM | POA: Diagnosis not present

## 2020-08-08 DIAGNOSIS — M549 Dorsalgia, unspecified: Secondary | ICD-10-CM

## 2020-08-08 MED ORDER — OXYCODONE-ACETAMINOPHEN 5-325 MG PO TABS: ORAL_TABLET | ORAL | 0 refills | Status: DC

## 2020-08-08 MED ORDER — ALPRAZOLAM 0.5 MG PO TABS
ORAL_TABLET | ORAL | 2 refills | Status: DC
Start: 2020-08-08 — End: 2020-10-27

## 2020-08-08 NOTE — Progress Notes (Signed)
This visit occurred during the SARS-CoV-2 public health emergency.  Safety protocols were in place, including screening questions prior to the visit, additional usage of staff PPE, and extensive cleaning of exam room while observing appropriate contact time as indicated for disinfecting solutions.  ER follow-up.  Fell walking the dog.  No LOC, got up and went into house.  L shoulder pain, to ER.  He was scraped up, healing scabs on the B knees.  L shoulder ROM is better, he can get his hand/arm over his head now.  Still with some pain down the L arm.  He was able to hold the dog the entire time he was down and still walk the dog home.    We talked about fall prevention.  He didn't have recent falls o/w.  The dog pulled him down, the dog started running "and down I went."   He quit walking the dog in the meantime.  He wasn't lightheaded at the time.   He has f/u with endocrine in 11/2020 pending.  Sugar has been stable per patient report, usually <130.  Usually 19-21 units of insulin, based on his sugar reading.    He is still putting up with baseline pain.  No ADE on med.  Still on oxycodone at baseline.  BZD used BID regularly for anxiety, with occ episodes of midday anxiety.  Not sedated from use.  No adverse effect on medication.  It does help when he uses it.  The issue was about having breakthrough symptoms in the middle of the day occasionally.  Meds, vitals, and allergies reviewed.   ROS: Per HPI unless specifically indicated in ROS section   nad ncat Neck supple; no LA rrr ctab Speech at baseline.  Leg tremor episodic.   L biceps ttp w/o bruising.  Normal elbow ROM.  Able to fully abduct the left shoulder.  Grip normal. Healing scabs noted on the bilateral knees anteriorly.

## 2020-08-08 NOTE — Patient Instructions (Signed)
I sent your meds to the pharmacy.  Update me as needed.  Take care.  Glad to see you.

## 2020-08-09 ENCOUNTER — Encounter: Payer: PPO | Admitting: Speech Pathology

## 2020-08-09 DIAGNOSIS — W19XXXA Unspecified fall, initial encounter: Secondary | ICD-10-CM | POA: Insufficient documentation

## 2020-08-09 DIAGNOSIS — Y92009 Unspecified place in unspecified non-institutional (private) residence as the place of occurrence of the external cause: Secondary | ICD-10-CM | POA: Insufficient documentation

## 2020-08-09 NOTE — Assessment & Plan Note (Signed)
He still putting up with his baseline back pain with some relief from oxycodone.  No adverse effect on medication.  He did not fall because of lack of coordination related to medication.  He was not lightheaded or sedated.  The dog pulled him down.  He is stopped walking the dog in the meantime.  I think it makes sense to continue oxycodone as is for now.  He will update me as needed.  Prescription sent.  Recheck periodically.

## 2020-08-09 NOTE — Assessment & Plan Note (Signed)
He is having breakthrough symptoms in the middle of the day without any sedation on alprazolam.  Discussed using half a tablet in the low day if needed with sedation caution.  He agrees.  He will update me as needed.

## 2020-08-09 NOTE — Assessment & Plan Note (Signed)
His wife is now walking the dog.  Cautions discussed with patient.  He will update me as needed.

## 2020-08-14 ENCOUNTER — Encounter: Payer: PPO | Admitting: Speech Pathology

## 2020-08-16 ENCOUNTER — Encounter: Payer: PPO | Admitting: Speech Pathology

## 2020-08-21 ENCOUNTER — Encounter: Payer: PPO | Admitting: Speech Pathology

## 2020-08-23 ENCOUNTER — Encounter: Payer: PPO | Admitting: Speech Pathology

## 2020-08-28 ENCOUNTER — Encounter: Payer: PPO | Admitting: Speech Pathology

## 2020-08-28 ENCOUNTER — Telehealth: Payer: Self-pay

## 2020-08-28 NOTE — Chronic Care Management (AMB) (Addendum)
Chronic Care Management Pharmacy Assistant   Name: Walter Harris  MRN: [id number] DOB: [birth year]  Reason for Encounter: Initial Questions for CCM visit  Patient Questions:  1.  Have you seen any other providers since your last PCP visit? No  2.  Any changes in your medicines or health? No   PCP : [name], MD  Allergies:   Allergies  Allergen Reactions   Bee Venom Anaphylaxis   Pravastatin Other (See Comments)    Severe myalgias   Ace Inhibitors     Cough   Actos [Pioglitazone] Other (See Comments)    Intolerant, not allergic.    Angiotensin Receptor Blockers Other (See Comments)    cramps   Aripiprazole Other (See Comments)    Unknown reaction many years ago   Crestor [Rosuvastatin Calcium] Other (See Comments)    Aches, even with twice weekly dosing   Metformin And Related Other (See Comments)    Able to tolerate XR formulation, not allergic.    Olanzapine Other (See Comments)    Unknown reaction many years ago   Pneumovax 23 [Pneumococcal Vac Polyvalent] Other (See Comments)    aches   Primidone Other (See Comments)    Vertigo at 50mg , able to tolerate 25mg     Codeine Rash   Divalproex Sodium Other (See Comments)     tremor   Lithium Carbonate Other (See Comments)    Tremors    Sulfamethoxazole-Trimethoprim Other (See Comments)    Mild reaction per Dr. Silvio Pate, pt does not remember the reaction    Medications: Outpatient Encounter Medications as of 08/28/2020  Medication Sig   ALPRAZolam (XANAX) 0.5 MG tablet 1 tab twice a day with extra 1/2 tab if needed.  Sedation caution   aspirin 81 MG EC tablet Take 1 tablet (81 mg total) by mouth daily. For stroke prevention   BD PEN NEEDLE NANO U/F 32G X 4 MM MISC USE AS DIRECTED   canagliflozin (INVOKANA) 300 MG TABS tablet Take 1 tablet (300 mg total) by mouth daily before breakfast. For diabetes   clopidogrel (PLAVIX) 75 MG tablet TAKE 1 TABLET BY MOUTH DAILY   FLUoxetine (PROZAC) 10 MG capsule Take 3  capsules (30 mg total) by mouth daily. For mood.   glipiZIDE (GLUCOTROL XL) 10 MG 24 hr tablet Take 1 tablet (10 mg total) by mouth daily with breakfast. For diabetes   insulin glargine (LANTUS SOLOSTAR) 100 UNIT/ML Solostar Pen Inject 19 Units into the skin daily. For diabetes   irbesartan (AVAPRO) 150 MG tablet Take 1 tablet (150 mg total) by mouth daily. For blood pressure   metFORMIN (GLUCOPHAGE-XR) 500 MG 24 hr tablet TAKE ONE TABLET 3 TIMES DAILY for diabetes   oxyCODONE-acetaminophen (PERCOCET/ROXICET) 5-325 MG tablet TAKE ONE TO TWO TABLETS BY MOUTH 3 TIMES DAILY IF NEEDED FOR PAIN. Fill on/after 10/14/20   oxyCODONE-acetaminophen (PERCOCET/ROXICET) 5-325 MG tablet TAKE ONE TO TWO TABLETS BY MOUTH 3 TIMES DAILY IF NEEDED FOR PAIN. Fill on/after 09/14/20   oxyCODONE-acetaminophen (PERCOCET/ROXICET) 5-325 MG tablet TAKE ONE TO TWO TABLETS BY MOUTH 3 TIMES DAILY IF NEEDED FOR PAIN. Fill on/after 08/15/20   primidone (MYSOLINE) 50 MG tablet Take 1 tablet (50 mg total) by mouth daily. For tremor   propranolol ER (INDERAL LA) 60 MG 24 hr capsule TAKE 1 CAPSULE EVERY DAY   tiZANidine (ZANAFLEX) 4 MG tablet TAKE ONE TABLET 3 TIMES DAILY AS NEEDED FOR MUSCLE SPASM MAY TAKE 1 EXTRADOSE DAILY AS NEEDED   traZODone (DESYREL)  50 MG tablet TAKE 1 AND 1/2 TABLETS BY MOUTH AT BEDTIME AS NEEDED FOR SLEEP   No facility-administered encounter medications on file as of 08/28/2020.    Current Diagnosis: Patient Active Problem List   Diagnosis Date Noted   Fall at home 08/09/2020   History of CVA in adulthood 12/07/2019   Gait abnormality 11/12/2019   Tremor, essential 11/12/2019   Health care maintenance 10/04/2018   Thumb pain 10/04/2018   Memory change 12/17/2017   Paresthesia 12/03/2017   Lightheaded 08/28/2017   Encounter for chronic pain management 06/18/2017   Neck pain 05/14/2017   Panic 01/10/2017   Muscle ache 10/05/2015   Syncope 07/08/2015   Chronic back pain 04/12/2015   Medicare annual  wellness visit, initial 03/08/2015   Advance care planning 03/08/2015   Dysgeusia 11/28/2014   Anxiety state 10/27/2014   Diabetic polyneuropathy ([place]) 02/14/2014   Rhinitis, chronic 01/13/2012   Basal cell carcinoma    LUMBAR RADICULOPATHY 10/16/2010   HLD (hyperlipidemia) 05/13/2008   Uncontrolled type 2 diabetes mellitus with peripheral neuropathy ([place]) 05/26/2007   Essential hypertension 05/26/2007   SCOLIOSIS 05/26/2007   Any side effects from any medications? No Do you have an symptoms or problems not managed by your medications? No not at this time. Any concerns about your health right now? Not at this time.  Has your provider asked that you check blood pressure, blood sugar, or follow special diet at home? States he checks his blood sugar daily. Do you get any type of exercise on a regular basis? Minimal. Due to history of stroke 12/08/19 and severe back pain.   Can you think of a goal you would like to reach for your health? No "they have accepted that they are aging and their limitations" Do you have any problems getting your medications? No Is there anything that you would like to discuss during the appointment? Not at this time but they will make a note if they think of any.   Patient reminded to have all medications and supplements available to him for his appointment with Sharyn Lull on 09/05/20. Reminded patient it was a telephone appointment.  Follow-Up:  Pharmacist Review  Margaretmary Dys, CMA  I have reviewed the care management and care coordination activities outlined in this encounter. Items above were addressed with patient at initial CCM visit.   Debbora Dus, PharmD Clinical Pharmacist [facility] [phone]

## 2020-08-30 ENCOUNTER — Encounter: Payer: PPO | Admitting: Speech Pathology

## 2020-09-05 ENCOUNTER — Ambulatory Visit: Payer: PPO

## 2020-09-05 ENCOUNTER — Other Ambulatory Visit: Payer: Self-pay

## 2020-09-05 DIAGNOSIS — IMO0002 Reserved for concepts with insufficient information to code with codable children: Secondary | ICD-10-CM

## 2020-09-05 DIAGNOSIS — E1142 Type 2 diabetes mellitus with diabetic polyneuropathy: Secondary | ICD-10-CM

## 2020-09-05 DIAGNOSIS — I10 Essential (primary) hypertension: Secondary | ICD-10-CM

## 2020-09-05 NOTE — Chronic Care Management (AMB) (Signed)
Chronic Care Management Pharmacy  Name: [name]  MRN: [id number] DOB: 02/21/1946  Chief Complaint/ HPI  Walter Harris,  74 y.o. , male presents for their Initial CCM visit with the clinical pharmacist via telephone due to COVID-19 Pandemic. Reviewed initial questions on 08/28/20 per CMA, patient denied any medication or health related concerns.   PCP : [name], MD  Their chronic conditions include: HTN, chronic rhinitis, uncontrolled type 2 diabetes, tremor, HLD, anxiety, chronic pain, h/o CVA 2021  Patient concerns: reports dizziness every day, ongoing for a while, when he stands up, he falls backwards. Denies much improvement off amlodipine.  Office Visits:  08/08/20: [name] - ER follow up, patient fell walking the dog, discussed fall prevention; Following with endocrinology 11/2020, pt sugar has been stable per report; pain at baseline on oxycodone; BZD BID for anxiety, not sedated from use, some breakthrough symptoms in the middle of the day. Add 1/2 tablet Xanax in the middle of the day PRN. Denies ADRs.   06/13/20: [name] - Mood has been better on higher dose fluoxetine, off citalopram; reports dizziness, h/o of orthostasis, taper off amlodipine  Consult Visit:  07/31/20: ED - fall, contusion of left shoulder   05/31/20: Endocrinology - His A1c from 7/21 was 9.0. Previously, his A1c has not matched his blood sugars as shown by his Fructosamine tests above. Based on review of his meter, I will make no changes to his regimen. He cannot take his MTF all at once due to GI issues. Foot exam completed.   Allergies  Allergen Reactions  . Bee Venom Anaphylaxis  . Pravastatin Other (See Comments)    Severe myalgias  . Ace Inhibitors     Cough  . Actos [Pioglitazone] Other (See Comments)    Intolerant, not allergic.   . Angiotensin Receptor Blockers Other (See Comments)    cramps  . Aripiprazole Other (See Comments)    Unknown reaction many years ago  . Crestor  [Rosuvastatin Calcium] Other (See Comments)    Aches, even with twice weekly dosing  . Metformin And Related Other (See Comments)    Able to tolerate XR formulation, not allergic.   . Olanzapine Other (See Comments)    Unknown reaction many years ago  . Pneumovax 23 [Pneumococcal Vac Polyvalent] Other (See Comments)    aches  . Primidone Other (See Comments)    Vertigo at 30m, able to tolerate 296m  . Codeine Rash  . Divalproex Sodium Other (See Comments)     tremor  . Lithium Carbonate Other (See Comments)    Tremors   . Sulfamethoxazole-Trimethoprim Other (See Comments)    Mild reaction per Dr. LeSilvio Patept does not remember the reaction   Medications: Outpatient Encounter Medications as of 09/05/2020  Medication Sig  . ALPRAZolam (XANAX) 0.5 MG tablet 1 tab twice a day with extra 1/2 tab if needed.  Sedation caution  . aspirin 81 MG EC tablet Take 1 tablet (81 mg total) by mouth daily. For stroke prevention  . BD PEN NEEDLE NANO U/F 32G X 4 MM MISC USE AS DIRECTED  . canagliflozin (INVOKANA) 300 MG TABS tablet Take 1 tablet (300 mg total) by mouth daily before breakfast. For diabetes  . clopidogrel (PLAVIX) 75 MG tablet TAKE 1 TABLET BY MOUTH DAILY  . glipiZIDE (GLUCOTROL XL) 10 MG 24 hr tablet Take 1 tablet (10 mg total) by mouth daily with breakfast. For diabetes  . insulin glargine (LANTUS SOLOSTAR) 100 UNIT/ML Solostar Pen  Inject 19 Units into the skin daily. For diabetes  . irbesartan (AVAPRO) 150 MG tablet Take 1 tablet (150 mg total) by mouth daily. For blood pressure  . metFORMIN (GLUCOPHAGE-XR) 500 MG 24 hr tablet TAKE ONE TABLET 3 TIMES DAILY for diabetes  . oxyCODONE-acetaminophen (PERCOCET/ROXICET) 5-325 MG tablet TAKE ONE TO TWO TABLETS BY MOUTH 3 TIMES DAILY IF NEEDED FOR PAIN. Fill on/after 10/14/20  . oxyCODONE-acetaminophen (PERCOCET/ROXICET) 5-325 MG tablet TAKE ONE TO TWO TABLETS BY MOUTH 3 TIMES DAILY IF NEEDED FOR PAIN. Fill on/after 09/14/20  .  oxyCODONE-acetaminophen (PERCOCET/ROXICET) 5-325 MG tablet TAKE ONE TO TWO TABLETS BY MOUTH 3 TIMES DAILY IF NEEDED FOR PAIN. Fill on/after 08/15/20  . primidone (MYSOLINE) 50 MG tablet Take 1 tablet (50 mg total) by mouth daily. For tremor  . propranolol ER (INDERAL LA) 60 MG 24 hr capsule TAKE 1 CAPSULE EVERY DAY  . tiZANidine (ZANAFLEX) 4 MG tablet TAKE ONE TABLET 3 TIMES DAILY AS NEEDED FOR MUSCLE SPASM MAY TAKE 1 EXTRADOSE DAILY AS NEEDED  . traZODone (DESYREL) 50 MG tablet TAKE 1 AND 1/2 TABLETS BY MOUTH AT BEDTIME AS NEEDED FOR SLEEP  . [DISCONTINUED] FLUoxetine (PROZAC) 10 MG capsule Take 3 capsules (30 mg total) by mouth daily. For mood.   No facility-administered encounter medications on file as of 09/05/2020.    Current Diagnosis/Assessment:  SDOH Interventions   Flowsheet Row Most Recent Value  SDOH Interventions   Financial Strain Interventions Intervention Not Indicated     Goals Addressed            This Visit's Progress   . Pharmacy Care Plan       CARE PLAN ENTRY (see longitudinal plan of care for additional care plan information)  Current Barriers:  . Chronic Disease Management support, education, and care coordination needs related to Hypertension and Diabetes   Hypertension BP Readings from Last 3 Encounters:  08/08/20 140/88  07/31/20 131/75  06/13/20 124/68   . Pharmacist Clinical Goal(s): o Over the next  6 months, patient will work with PharmD and providers to maintain BP goal <140/90 mmHg . Current regimen:  o Irbesartan 150 mg - 1 tablet daily . Interventions: o Reviewed home BP monitoring and goals  o Recommend contacting clinic if home blood pressure consistently above 140/90 mmHg o Patient still having some lightheadedness upon standing - discussed this may be worsened by Invokana . Patient self care activities - Over the next 6 months, patient will: o Check BP weekly, document, and provide at future appointments o Ensure daily salt intake <  2300 mg/day  Diabetes Lab Results  Component Value Date/Time   HGBA1C 9.0 03/30/2020 12:00 AM   HGBA1C 9.6 (H) 12/07/2019 04:29 AM   HGBA1C 9.6 (H) 12/06/2019 05:47 PM   . Pharmacist Clinical Goal(s): o Over the next 6 months, patient will work with PharmD and providers to maintain fasting blood glucose 90-130. . Current regimen:   Invokana 300 mg - 1/2 tablet daily  Glipizide 10 mg XL - 1 tablet daily   Lantus Solostar - 21 units   Metformin 500 mg - 1 tablet three times daily . Interventions: o Reviewed home BG log - 95-100 before breakfast daily, within goal o Reviewed medication adherence and tolerance o Recommend considering potential of Invokana worsening his orthostatic hypotension. Pt to discuss with endocrinology.  . Patient self care activities - Over the next 6 months, patient will: o Check blood sugar once daily, document, and provide at future appointments o  Contact provider with any episodes of hypoglycemia  Initial goal documentation      Hypertension   CMP Latest Ref Rng & Units 12/07/2019 12/06/2019 09/29/2019  Glucose 70 - 99 mg/dL 85 135(H) -  BUN 8 - 23 mg/dL 18 22 -  Creatinine 0.61 - 1.24 mg/dL 0.99 1.19 1.1  Sodium 135 - 145 mmol/L 141 137 -  Potassium 3.5 - 5.1 mmol/L 3.7 4.3 -  Chloride 98 - 111 mmol/L 108 101 -  CO2 22 - 32 mmol/L 23 23 -  Calcium 8.9 - 10.3 mg/dL 8.9 9.5 -  Total Protein 6.5 - 8.1 g/dL 6.5 - -  Total Bilirubin 0.3 - 1.2 mg/dL 0.9 - -  Alkaline Phos 38 - 126 U/L 58 - -  AST 15 - 41 U/L 19 - 27  ALT 0 - 44 U/L 20 - 34(A)   Office blood pressures are: BP Readings from Last 3 Encounters:  08/08/20 140/88  07/31/20 131/75  06/13/20 124/68   BG goal < 140/90 mmHg Patient has failed these meds in the past: amlodipine - stopped due to orthostatic hypotension Patient checks BP at home: checking 2-3 days per week Patient home BP readings are ranging: runs in the 'moderate' range based on the chart he has, none < 90/60  mmHg  Patient is currently controlled on the following medications:   Irbesartan 150 mg - 1 tablet daily  We discussed: Pt denies any improvement in dizziness/orthostasis off amlodipine. BP is running a little higher off this. May consider adding back if BP > 140/90.   Plan: Continue current medications  Hyperlipidemia   LDL goal < 70  Last lipids Lab Results  Component Value Date   CHOL 169 12/07/2019   HDL 30 (L) 12/07/2019   LDLCALC 104 (H) 12/07/2019   LDLDIRECT 160.2 06/01/2012   TRIG 177 (H) 12/07/2019   CHOLHDL 5.6 12/07/2019   Hepatic Function Latest Ref Rng & Units 12/07/2019 09/29/2019 09/28/2018  Total Protein 6.5 - 8.1 g/dL 6.5 - 6.8  Albumin 3.5 - 5.0 g/dL 3.6 - 4.2  AST 15 - 41 U/L _0 ALT 0 - 44 U/L 20 34(A) 18  Alk Phosphatase 38 - 126 U/L 58 - 57  Total Bilirubin 0.3 - 1.2 mg/dL 0.9 - 0.7  Bilirubin, Direct 0.0 - 0.3 mg/dL - - -    CBC Latest Ref Rng & Units 12/07/2019 12/06/2019 10/01/2019  WBC 4.0 - 10.5 K/uL 6.0 7.9 6.2  Hemoglobin 13.0 - 17.0 g/dL 14.8 15.7 14.5  Hematocrit 39.0 - 52.0 % 43.5 46.7 44.1  Platelets 150 - 400 K/uL 181 213 212.0   The ASCVD Risk score (Goff DC Jr., et al., 2013) failed to calculate for the following reasons:   The patient has a prior MI or stroke diagnosis   Patient has failed these meds in past: statins, zetia  Patient is currently uncontrolled on the following medications:  . Aspirin 81 mg - 1 tablet daily . Plavix 75 mg - 1 tablet daily   We discussed: Pt is statin intolerant. LDL control through diet. CBC stable on DAPT.  Plan: Continue current medications; Need to add statin intolerance diagnosis to chart. Limit cholesterol content in foods.   Diabetes   Recent Relevant Labs: Lab Results  Component Value Date/Time   HGBA1C 9.0 03/30/2020 12:00 AM   HGBA1C 9.6 (H) 12/07/2019 04:29 AM   HGBA1C 9.6 (H) 12/06/2019 05:47 PM   MICROALBUR 5.6 (H) 02/28/2015 09:19 AM   MICROALBUR  5.9 (H) 02/19/2010 03:44 PM     Checking BG: Daily - before breakfast  BG runs around 95-100 first thing in the morning, per endo note, A1c in not accurate. Denies hypoglycemia Eats PB crackers before bedtime, has glucose tablets on hand   Patient has failed these meds in past: glimepiride Patient is currently controlled on the following medications:   Invokana 300 mg - 1/2 tablet daily  Glipizide 10 mg XL - 1 tablet daily   Lantus Solostar - 21 units (confirms per pt current dose is 21 units, he may increase or decrease by 1 unit based on fasting BG readings, tries to avoid too many BG < 100, goal is 100-105)   Metformin 500 mg - TID  Lab Results  Component Value Date/Time   HMDIABEYEEXA No Retinopathy 07/06/2020 12:00 AM    Last diabetic foot exam:  05/31/20 (endocrinology)  We discussed: discussed potential for Invokana to cause orthostatic hypotension, recommended discussing with Dr. Honor Junes   Plan: Continue current medications  Anxiety   Patient has failed these meds in past: Citalopram  Patient is currently controlled on the following medications:   Xanax  0.5 mg - 1 tablet BID with extra 1/2 tablet PRN lunch   Fluoxetine 10 mg - 1 tablet daily  Trazodone 50 mg - 1 and 1/2 tablet bedtime PRN sleep  We discussed: Wife reports anxiety is as good as it can be at this time. They have worked hard to find a regimen that has worked this well for him. Decline any changes at this time.  Plan: Continue current medications  Chronic Pain   Patient has failed these meds in past: none reported Patient is currently controlled on the following medications:   Oxycodone-acetaminophen 5-325 mg - 1 to 2 tablets TID PRN pain  Tizanidine 4 mg - 1 tablet TID PRN pain  We discussed: Wife reports pain is well controlled at this time. Shoulder is still bothering him some from recent fall. Reports taking oxycodone and tizanidine daily TID. Reports almost immediate relief with tizanidine for muscle cramps.   Plan:  Continue current medications   Tremors   Patient has failed these meds in past: none Patient is currently controlled on the following medications:   Primidone 50 mg - 1 tablet daily  Propranolol ER 60 mg - 1 capsule daily  We discussed: Reports doing well on current therapy, but still bothersome. There was a time when he could barely feed himself, but doing much better. This is the only combination that has worked for him.  Plan: Continue current medications  Medication Management   Patient's preferred pharmacy is: [facility]   Uses pill box? Yes Pt endorses 100% compliance- may be an hour late, but never missed doses  Wife coordinates medications  Denies cost concerns Medications are delivered Pharmacist knows them well   We discussed: Discussed benefits of medication synchronization, packaging and delivery as well as enhanced pharmacist oversight with [employer].  Plan  Continue current medication management strategy  Follow up: 6 month phone visit (anytime)   Debbora Dus, PharmD Clinical Pharmacist [facility] at [facility] [phone]

## 2020-09-06 ENCOUNTER — Telehealth: Payer: Self-pay | Admitting: Family Medicine

## 2020-09-06 NOTE — Telephone Encounter (Signed)
Patient's wife came into office and dropped off form to have filled for handicap placard. Placed in tower. Requesting it be mailed when done.

## 2020-09-06 NOTE — Telephone Encounter (Signed)
I will work on the hardcopy when I am back in clinic.  Thanks. 

## 2020-09-07 ENCOUNTER — Telehealth: Payer: Self-pay | Admitting: Family Medicine

## 2020-09-07 MED ORDER — FLUOXETINE HCL 10 MG PO CAPS
30.0000 mg | ORAL_CAPSULE | Freq: Every day | ORAL | 1 refills | Status: DC
Start: 2020-09-07 — End: 2021-08-28

## 2020-09-07 NOTE — Telephone Encounter (Signed)
Thanks

## 2020-09-07 NOTE — Addendum Note (Signed)
Addended by: Joaquim Nam on: 09/07/2020 01:52 PM   Modules accepted: Orders

## 2020-09-07 NOTE — Telephone Encounter (Signed)
Called and spoke with patients wife who stated that it was her fault that this confusion occurred. She called in the refill on the old prescription. Patients wife verbalized that he is taking the 30 mg a day as instructed per Dr. Para March. Patients wife was very appreciative of call.

## 2020-09-07 NOTE — Telephone Encounter (Signed)
Patients wife is calling in stating that there "needs to be a change of the description for" Fluoxetine  According to [facility] their description states 1-2 capsules by mouth everyday. Not sure where the mix up is. Please advise. EM

## 2020-09-07 NOTE — Telephone Encounter (Signed)
I thought he was taking 30mg  a day, 3 of the 10mg  capsules.  I resent the rx.  If he hasn't been on 30mg , then please update me about his dosing.  Thanks.

## 2020-09-08 NOTE — Patient Instructions (Addendum)
September 08, 2020  Dear Walter Harris,  It was a pleasure meeting you during our initial appointment on September 08, 2020. Below is a summary of the goals we discussed and components of chronic care management. Please contact me anytime with questions or concerns.   Visit Information  Goals Addressed            This Visit's Progress   . Pharmacy Care Plan       CARE PLAN ENTRY (see longitudinal plan of care for additional care plan information)  Current Barriers:  . Chronic Disease Management support, education, and care coordination needs related to Hypertension and Diabetes   Hypertension BP Readings from Last 3 Encounters:  08/08/20 140/88  07/31/20 131/75  06/13/20 124/68   . Pharmacist Clinical Goal(s): o Over the next  6 months, patient will work with PharmD and providers to maintain BP goal <140/90 mmHg . Current regimen:  o Irbesartan 150 mg - 1 tablet daily . Interventions: o Reviewed home BP monitoring and goals  o Recommend contacting clinic if home blood pressure consistently above 140/90 mmHg o Patient still having some lightheadedness upon standing - discussed this may be worsened by Invokana . Patient self care activities - Over the next 6 months, patient will: o Check BP weekly, document, and provide at future appointments o Ensure daily salt intake < 2300 mg/day  Diabetes Lab Results  Component Value Date/Time   HGBA1C 9.0 03/30/2020 12:00 AM   HGBA1C 9.6 (H) 12/07/2019 04:29 AM   HGBA1C 9.6 (H) 12/06/2019 05:47 PM   . Pharmacist Clinical Goal(s): o Over the next 6 months, patient will work with PharmD and providers to maintain fasting blood glucose 90-130. . Current regimen:   Invokana 300 mg - 1/2 tablet daily  Glipizide 10 mg XL - 1 tablet daily   Lantus Solostar - 21 units   Metformin 500 mg - 1 tablet three times daily . Interventions: o Reviewed home BG log - 95-100 before breakfast daily, within goal o Reviewed medication adherence  and tolerance o Recommend considering potential of Invokana worsening his orthostatic hypotension. Pt to discuss with endocrinology.  . Patient self care activities - Over the next 6 months, patient will: o Check blood sugar once daily, document, and provide at future appointments o Contact provider with any episodes of hypoglycemia  Initial goal documentation       [name] was given information about Chronic Care Management services today including:  1. CCM service includes personalized support from designated clinical staff supervised by his physician, including individualized plan of care and coordination with other care providers 2. 24/7 contact phone numbers for assistance for urgent and routine care needs. 3. Standard insurance, coinsurance, copays and deductibles apply for chronic care management only during months in which we provide at least 20 minutes of these services. Most insurances cover these services at 100%, however patients may be responsible for any copay, coinsurance and/or deductible if applicable. This service may help you avoid the need for more expensive face-to-face services. 4. Only one practitioner may furnish and bill the service in a calendar month. 5. The patient may stop CCM services at any time (effective at the end of the month) by phone call to the office staff.  Patient agreed to services and verbal consent obtained.   The patient verbalized understanding of instructions, educational materials, and care plan provided today and agreed to receive a mailed copy of patient instructions, educational materials, and care plan.  Telephone  follow up appointment with pharmacy team member scheduled for: 03/06/2021 at 8:30 AM (phone visit)  Debbora Dus, PharmD Clinical Pharmacist [facility] [phone]   Orthostatic Hypotension Blood pressure is a measurement of how strongly, or weakly, your blood is pressing against the walls of  your arteries. Orthostatic hypotension is a sudden drop in blood pressure that happens when you quickly change positions, such as when you get up from sitting or lying down. Arteries are blood vessels that carry blood from your heart throughout your body. When blood pressure is too low, you may not get enough blood to your brain or to the rest of your organs. This can cause weakness, light-headedness, rapid heartbeat, and fainting. This can last for just a few seconds or for up to a few minutes. Orthostatic hypotension is usually not a serious problem. However, if it happens frequently or gets worse, it may be a sign of something more serious. What are the causes? This condition may be caused by:  Sudden changes in posture, such as standing up quickly after you have been sitting or lying down.  Blood loss.  Loss of body fluids (dehydration).  Heart problems.  Hormone (endocrine) problems.  Pregnancy.  Severe infection.  Lack of certain nutrients.  Severe allergic reactions (anaphylaxis).  Certain medicines, such as blood pressure medicine or medicines that make the body lose excess fluids (diuretics). Sometimes, this condition can be caused by not taking medicine as directed, such as taking too much of a certain medicine. What increases the risk? The following factors may make you more likely to develop this condition:  Age. Risk increases as you get older.  Conditions that affect the heart or the central nervous system.  Taking certain medicines, such as blood pressure medicine or diuretics.  Being pregnant. What are the signs or symptoms? Symptoms of this condition may include:  Weakness.  Light-headedness.  Dizziness.  Blurred vision.  Fatigue.  Rapid heartbeat.  Fainting, in severe cases. How is this diagnosed? This condition is diagnosed based on:  Your medical history.  Your symptoms.  Your blood pressure measurement. Your health care provider will check  your blood pressure when you are: ? Lying down. ? Sitting. ? Standing. A blood pressure reading is recorded as two numbers, such as "120 over 80" (or 120/80). The first ("top") number is called the systolic pressure. It is a measure of the pressure in your arteries as your heart beats. The second ("bottom") number is called the diastolic pressure. It is a measure of the pressure in your arteries when your heart relaxes between beats. Blood pressure is measured in a unit called mm Hg. Healthy blood pressure for most adults is 120/80. If your blood pressure is below 90/60, you may be diagnosed with hypotension. Other information or tests that may be used to diagnose orthostatic hypotension include:  Your other vital signs, such as your heart rate and temperature.  Blood tests.  Tilt table test. For this test, you will be safely secured to a table that moves you from a lying position to an upright position. Your heart rhythm and blood pressure will be monitored during the test. How is this treated? This condition may be treated by:  Changing your diet. This may involve eating more salt (sodium) or drinking more water.  Taking medicines to raise your blood pressure.  Changing the dosage of certain medicines you are taking that might be lowering your blood pressure.  Wearing compression stockings. These  stockings help to prevent blood clots and reduce swelling in your legs. In some cases, you may need to go to the hospital for:  Fluid replacement. This means you will receive fluids through an IV.  Blood replacement. This means you will receive donated blood through an IV (transfusion).  Treating an infection or heart problems, if this applies.  Monitoring. You may need to be monitored while medicines that you are taking wear off. Follow these instructions at home: Eating and drinking   Drink enough fluid to keep your urine pale yellow.  Eat a healthy diet, and follow instructions from  your health care provider about eating or drinking restrictions. A healthy diet includes: ? Fresh fruits and vegetables. ? Whole grains. ? Lean meats. ? Low-fat dairy products.  Eat extra salt only as directed. Do not add extra salt to your diet unless your health care provider told you to do that.  Eat frequent, small meals.  Avoid standing up suddenly after eating. Medicines  Take over-the-counter and prescription medicines only as told by your health care provider. ? Follow instructions from your health care provider about changing the dosage of your current medicines, if this applies. ? Do not stop or adjust any of your medicines on your own. General instructions   Wear compression stockings as told by your health care provider.  Get up slowly from lying down or sitting positions. This gives your blood pressure a chance to adjust.  Avoid hot showers and excessive heat as directed by your health care provider.  Return to your normal activities as told by your health care provider. Ask your health care provider what activities are safe for you.  Do not use any products that contain nicotine or tobacco, such as cigarettes, e-cigarettes, and chewing tobacco. If you need help quitting, ask your health care provider.  Keep all follow-up visits as told by your health care provider. This is important. Contact a health care provider if you:  Vomit.  Have diarrhea.  Have a fever for more than 2-3 days.  Feel more thirsty than usual.  Feel weak and tired. Get help right away if you:  Have chest pain.  Have a fast or irregular heartbeat.  Develop numbness in any part of your body.  Cannot move your arms or your legs.  Have trouble speaking.  Become sweaty or feel light-headed.  Faint.  Feel short of breath.  Have trouble staying awake.  Feel confused. Summary  Orthostatic hypotension is a sudden drop in blood pressure that happens when you quickly change  positions.  Orthostatic hypotension is usually not a serious problem.  It is diagnosed by having your blood pressure taken lying down, sitting, and then standing.  It may be treated by changing your diet or adjusting your medicines. This information is not intended to replace advice given to you by your health care provider. Make sure you discuss any questions you have with your health care provider. Document Revised: 02/19/2018 Document Reviewed: 02/19/2018 Elsevier Patient Education  [employer].

## 2020-09-10 ENCOUNTER — Encounter: Payer: Self-pay | Admitting: Family Medicine

## 2020-09-10 DIAGNOSIS — G720 Drug-induced myopathy: Secondary | ICD-10-CM | POA: Insufficient documentation

## 2020-09-15 ENCOUNTER — Other Ambulatory Visit: Payer: Self-pay | Admitting: Family Medicine

## 2020-09-15 NOTE — Telephone Encounter (Signed)
Dr.  [name] - please advise on refill. Thank you.

## 2020-09-18 NOTE — Telephone Encounter (Signed)
Sent. Thanks.   

## 2020-09-20 ENCOUNTER — Other Ambulatory Visit: Payer: Self-pay | Admitting: Family Medicine

## 2020-09-20 NOTE — Telephone Encounter (Signed)
Sent. Thanks.   

## 2020-09-20 NOTE — Telephone Encounter (Signed)
Please Advise

## 2020-10-27 ENCOUNTER — Telehealth: Payer: Self-pay

## 2020-10-27 ENCOUNTER — Ambulatory Visit (INDEPENDENT_AMBULATORY_CARE_PROVIDER_SITE_OTHER): Payer: PPO

## 2020-10-27 ENCOUNTER — Other Ambulatory Visit: Payer: Self-pay

## 2020-10-27 DIAGNOSIS — Z0000 Encounter for general adult medical examination without abnormal findings: Secondary | ICD-10-CM

## 2020-10-27 MED ORDER — OXYCODONE-ACETAMINOPHEN 5-325 MG PO TABS: ORAL_TABLET | ORAL | 0 refills | Status: DC

## 2020-10-27 MED ORDER — ALPRAZOLAM 0.5 MG PO TABS
ORAL_TABLET | ORAL | 2 refills | Status: DC
Start: 2020-10-27 — End: 2021-02-18

## 2020-10-27 NOTE — Progress Notes (Addendum)
Subjective:   Walter Harris is a 75 y.o. male who presents for Medicare Annual/Subsequent preventive examination.  Review of Systems: N/A     I connected with the patient today by telephone and verified that I am speaking with the correct person using two identifiers. Location patient: home Location nurse: work Persons participating in the telephone visit: patient, Walter Harris (wife) and nurse.   I discussed the limitations, risks, security and privacy concerns of performing an evaluation and management service by telephone and the availability of in person appointments. I also discussed with the patient that there may be a patient responsible charge related to this service. The patient expressed understanding and verbally consented to this telephonic visit.        Cardiac Risk Factors include: advanced age (>56men, >59 women);male gender;diabetes mellitus;hypertension     Objective:    Today's Vitals   There is no height or weight on file to calculate BMI.  Advanced Directives 10/27/2020 05/29/2020 01/04/2020 12/15/2019 12/06/2019 10/01/2019 05/10/2019  Does Patient Have a Medical Advance Directive? Yes Yes Yes Yes Yes Yes Yes  Type of Paramedic of [place];Living will [place];Living will [place];Living will [place];Living will [place];Living will [place];Living will [place];Living will  Does patient want to make changes to medical advance directive? - No - Patient declined - - No - Patient declined - No - Guardian declined  Copy of [place] in Chart? No - copy requested No - copy requested - - No - copy requested No - copy requested No - copy requested  Would patient like information on creating a medical advance directive? - - - - - - -    Current Medications (verified) Outpatient Encounter Medications as of  10/27/2020  Medication Sig  . ALPRAZolam (XANAX) 0.5 MG tablet 1 tab twice a day with extra 1/2 tab if needed.  Sedation caution  . aspirin 81 MG EC tablet Take 1 tablet (81 mg total) by mouth daily. For stroke prevention  . BD PEN NEEDLE NANO U/F 32G X 4 MM MISC USE AS DIRECTED  . canagliflozin (INVOKANA) 300 MG TABS tablet Take 1 tablet (300 mg total) by mouth daily before breakfast. For diabetes  . clopidogrel (PLAVIX) 75 MG tablet TAKE 1 TABLET BY MOUTH DAILY  . FLUoxetine (PROZAC) 10 MG capsule Take 3 capsules (30 mg total) by mouth daily. For mood.  Marland Kitchen glipiZIDE (GLUCOTROL XL) 10 MG 24 hr tablet Take 1 tablet (10 mg total) by mouth daily with breakfast. For diabetes  . insulin glargine (LANTUS SOLOSTAR) 100 UNIT/ML Solostar Pen Inject 19 Units into the skin daily. For diabetes  . irbesartan (AVAPRO) 150 MG tablet Take 1 tablet (150 mg total) by mouth daily. For blood pressure  . metFORMIN (GLUCOPHAGE-XR) 500 MG 24 hr tablet TAKE ONE TABLET 3 TIMES DAILY for diabetes  . oxyCODONE-acetaminophen (PERCOCET/ROXICET) 5-325 MG tablet TAKE ONE TO TWO TABLETS BY MOUTH 3 TIMES DAILY IF NEEDED FOR PAIN. Fill on/after 10/14/20  . oxyCODONE-acetaminophen (PERCOCET/ROXICET) 5-325 MG tablet TAKE ONE TO TWO TABLETS BY MOUTH 3 TIMES DAILY IF NEEDED FOR PAIN. Fill on/after 09/14/20  . oxyCODONE-acetaminophen (PERCOCET/ROXICET) 5-325 MG tablet TAKE ONE TO TWO TABLETS BY MOUTH 3 TIMES DAILY IF NEEDED FOR PAIN. Fill on/after 08/15/20  . primidone (MYSOLINE) 50 MG tablet Take 1 tablet (50 mg total) by mouth daily. For tremor  . propranolol ER (  INDERAL LA) 60 MG 24 hr capsule TAKE 1 CAPSULE EVERY DAY  . tiZANidine (ZANAFLEX) 4 MG tablet TAKE ONE TABLET 3 TIMES DAILY AS NEEDED FOR MUSCLE SPASM MAY TAKE 1 EXTRADOSE DAILY AS NEEDED  . traZODone (DESYREL) 50 MG tablet TAKE 1 AND 1/2 TABLETS BY MOUTH AT BEDTIME AS NEEDED FOR SLEEP   No facility-administered encounter medications on file as of 10/27/2020.    Allergies  (verified) Bee venom, Pravastatin, Ace inhibitors, Actos [pioglitazone], Angiotensin receptor blockers, Aripiprazole, Crestor [rosuvastatin calcium], Metformin and related, Olanzapine, Pneumovax 23 [pneumococcal vac polyvalent], Primidone, Codeine, Divalproex sodium, Lithium carbonate, and Sulfamethoxazole-trimethoprim   History: Past Medical History:  Diagnosis Date  . Basal cell carcinoma    multiple  . Cataracts, both eyes 2019   surgery pending April 2020  . Complication of anesthesia    post-operative cognitive dysfunction after ACDF on 05/01/11 [facility])  . Deaf    right ear  . Diabetes mellitus   . DJD (degenerative joint disease)    lumbar spine  . Gait abnormality 11/12/2019  . Hearing impaired    hearing aids-transmitter rt   . History of blood transfusion    as a baby in 29  . History of colonic polyps   . Hyperlipidemia   . Hypertension    pt. denies high blood pressure  . Kidney stone 1967  . Memory change 12/17/2017  . Nose fracture    3x  . OCD (obsessive compulsive disorder)   . Stroke [facility])    "mini stroke, that's what gave me the tremors"  . Stuttering   . Tremor, essential   . Vertigo    random  . Wears dentures    full upper  . Wears glasses    Past Surgical History:  Procedure Laterality Date  . APPENDECTOMY  1960  . BACK SURGERY  2012   lumbar surgery  . CATARACT EXTRACTION W/PHACO Right 04/12/2019   Procedure: CATARACT EXTRACTION PHACO AND INTRAOCULAR LENS PLACEMENT (IOC) RIGHT, DIABETIC;  Surgeon: [name], MD;  Location: [place];  Service: Ophthalmology;  Laterality: Right;  Diabetic - insulin and oral meds  . CATARACT EXTRACTION W/PHACO Left 05/10/2019   Procedure: CATARACT EXTRACTION PHACO AND INTRAOCULAR LENS PLACEMENT (IOC)  LEFT DIABETIC  00:25.6  14.4%  3.75;  Surgeon: [name], MD;  Location: [place];  Service: Ophthalmology;  Laterality: Left;  Diabetic - insulin and oral meds  . CERVICAL FUSION   1990  . CERVICAL FUSION  8/12   multiple levels with plates  . CHOLESTEATOMA EXCISION  1993  . COLLATERAL LIGAMENT REPAIR, ELBOW     bilateral, nerve improvement left 08/04, right 09/04  . COLONOSCOPY    . CYST EXCISION     back of cervical spine total of 5 surgeries  . EXTERNAL EAR SURGERY     multiple ear surguries as a child  . OTHER SURGICAL HISTORY     benign head tumor#5  . OTHER SURGICAL HISTORY     sebaceous cysts-post neck x5  . SHOULDER ARTHROSCOPY W/ ACROMIAL REPAIR  08/2004   left shoulder  . TONSILLECTOMY    . ULNAR NERVE TRANSPOSITION Right 05/13/2013   Procedure: RIGHT ULNAR NERVE DECOMPRESSION/TRANSPOSITION;  Surgeon: [name]., MD;  Location: [place];  Service: Orthopedics;  Laterality: Right;   Family History  Problem Relation Age of Onset  . OCD Mother   . Bipolar disorder Mother   . Cancer Mother   . 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 Arthritis Sister   . OCD Sister   . Colon cancer Neg Hx   . Prostate cancer Neg Hx    Social History   Socioeconomic History  . Marital status: Married    Spouse name: Not on file  . Number of children: 1  . Years of education: Not on file  . Highest education level: Not on file  Occupational History  . Occupation: disabled    Employer: RETIRED    CommentDatabase administrator  Tobacco Use  . Smoking status: Former Smoker    Packs/day: 0.50    Years: 40.00    Pack years: 20.00    Types: Pipe    Quit date: 06/09/2016    Years since quitting: 4.3  . Smokeless tobacco: Never Used  Vaping Use  . Vaping Use: Never used  Substance and Sexual Activity  . Alcohol use: No    Alcohol/week: 0.0 standard drinks  . Drug use: No  . Sexual activity: Never  Other Topics Concern  . Not on file  Social History Narrative   Married, 1992   1 biological child, some contact as of 2017   Wife has 2 kids, no contact as of 2017   Prev worked as Designer, television/film set, Lobbyist,  etc   Caffeine use: 1/2 cup every morning- coffee   Right handed    Social Determinants of Radio broadcast assistant Strain: Low Risk   . Difficulty of Paying Living Expenses: Not hard at all  Food Insecurity: No Food Insecurity  . Worried About Charity fundraiser in the Last Year: Never true  . Ran Out of Food in the Last Year: Never true  Transportation Needs: No Transportation Needs  . Lack of Transportation (Medical): No  . Lack of Transportation (Non-Medical): No  Physical Activity: Inactive  . Days of Exercise per Week: 0 days  . Minutes of Exercise per Session: 0 min  Stress: No Stress Concern Present  . Feeling of Stress : Not at all  Social Connections: Not on file    Tobacco Counseling Counseling given: Not Answered   Clinical Intake:  Pre-visit preparation completed: Yes  Pain : 0-10 Pain Type: Chronic pain Pain Location: Back (left shoulder) Pain Descriptors / Indicators: Aching Pain Onset: More than a month ago Pain Frequency: Intermittent     Nutritional Risks: None Diabetes: Yes CBG done?: No Did pt. bring in CBG monitor from home?: No  How often do you need to have someone help you when you read instructions, pamphlets, or other written materials from your doctor or pharmacy?: 1 - Never What is the last grade level you completed in school?: College graduate  Diabetic: Yes Nutrition Risk Assessment:  Has the patient had any N/V/D within the last 2 months?  No  Does the patient have any non-healing wounds?  No  Has the patient had any unintentional weight loss or weight gain?  No   Diabetes:  Is the patient diabetic?  Yes  If diabetic, was a CBG obtained today?  No  telephone visit  Did the patient bring in their glucometer from home?  No  telephone visit  How often do you monitor your CBG's? Every morning.   Financial Strains and Diabetes Management:  Are you having any financial strains with the device, your supplies or your  medication? No .  Does the patient want to be seen by Chronic Care Management for management of their diabetes?  No  Would the patient like to be referred to a Nutritionist or for Diabetic Management?  No   Diabetic Exams:  Diabetic Eye Exam: Completed 07/06/2020 Diabetic Foot Exam: Completed 04/25/2020   Interpreter Needed?: No  Information entered by :: CJohnson, LPN   Activities of Daily Living In your present state of health, do you have any difficulty performing the following activities: 10/27/2020  Hearing? Y  Comment wears hearing aid, total deaf in one ear  Vision? N  Difficulty concentrating or making decisions? Y  Comment has memory loss due to stroke  Walking or climbing stairs? N  Dressing or bathing? N  Doing errands, shopping? N  Preparing Food and eating ? N  Using the Toilet? N  In the past six months, have you accidently leaked urine? N  Do you have problems with loss of bowel control? N  Managing your Medications? N  Managing your Finances? N  Housekeeping or managing your Housekeeping? N  Some recent data might be hidden    Patient Care Team: [name], MD as PCP - General (Family Medicine) Kary Kos, MD as Consulting Physician (Neurosurgery) Estill Cotta, MD as Referring Physician (Ophthalmology) Oneta Rack, MD as Referring Physician (Dermatology) Kary Kos, MD as Consulting Physician (Neurosurgery) Debbora Dus, [facility] as Pharmacist (Pharmacist)  Indicate any recent Medical Services you may have received from other than [facility] providers in the past year (date may be approximate).     Assessment:   This is a routine wellness examination for [place].  Hearing/Vision screen  Hearing Screening   125Hz  250Hz  500Hz  1000Hz  2000Hz  3000Hz  4000Hz  6000Hz  8000Hz   Right ear:           Left ear:           Vision Screening Comments: Patient gets annual eye exams   Dietary issues and exercise activities discussed: Current Exercise Habits:  The patient does not participate in regular exercise at present, Exercise limited by: None identified  Goals    . medication adherence     Starting 09/28/2018, I will continue to take medications as prescribed in an effort to manage my health conditions.     . Patient Stated     10/01/2019, I will continue to take medications as prescribed in an effort to manage my health conditions.    . Patient Stated     10/27/2020, I will maintain and continue medications as prescribed.     . Pharmacy Care Plan     CARE PLAN ENTRY (see longitudinal plan of care for additional care plan information)  Current Barriers:  . Chronic Disease Management support, education, and care coordination needs related to Hypertension and Diabetes   Hypertension BP Readings from Last 3 Encounters:  08/08/20 140/88  07/31/20 131/75  06/13/20 124/68   . Pharmacist Clinical Goal(s): o Over the next  6 months, patient will work with PharmD and providers to maintain BP goal <140/90 mmHg . Current regimen:  o Irbesartan 150 mg - 1 tablet daily . Interventions: o Reviewed home BP monitoring and goals  o Recommend contacting clinic if home blood pressure consistently above 140/90 mmHg o Patient still having some lightheadedness upon standing - discussed this may be worsened by Invokana . Patient self care activities - Over the next 6 months, patient will: o Check BP weekly, document, and provide at future appointments o Ensure daily salt intake < 2300 mg/day  Diabetes Lab Results  Component Value Date/Time   HGBA1C 9.0 03/30/2020 12:00 AM  HGBA1C 9.6 (H) 12/07/2019 04:29 AM   HGBA1C 9.6 (H) 12/06/2019 05:47 PM   . Pharmacist Clinical Goal(s): o Over the next 6 months, patient will work with PharmD and providers to maintain fasting blood glucose 90-130. . Current regimen:   Invokana 300 mg - 1/2 tablet daily  Glipizide 10 mg XL - 1 tablet daily   Lantus Solostar - 21 units   Metformin 500 mg - 1 tablet  three times daily . Interventions: o Reviewed home BG log - 95-100 before breakfast daily, within goal o Reviewed medication adherence and tolerance o Recommend considering potential of Invokana worsening his orthostatic hypotension. Pt to discuss with endocrinology.  . Patient self care activities - Over the next 6 months, patient will: o Check blood sugar once daily, document, and provide at future appointments o Contact provider with any episodes of hypoglycemia  Initial goal documentation      Depression Screen PHQ 2/9 Scores 10/27/2020 10/01/2019 09/28/2018 09/25/2017 03/14/2016 03/07/2015  PHQ - 2 Score 0 0 0 0 0 1  PHQ- 9 Score 0 0 0 0 - -    Fall Risk Fall Risk  10/27/2020 10/01/2019 09/28/2018 09/25/2017 01/06/2017  Falls in the past year? 1 1 1  No Yes  Comment - balance issues multiple falls due to vertigo; unable to specify quanity - -  Number falls in past yr: 0 1 1 - 2 or more  Injury with Fall? 0 0 0 - No  Risk Factor Category  - - - - High Fall Risk  Risk for fall due to : Impaired balance/gait;Medication side effect Impaired balance/gait;Medication side effect;History of fall(s) - - -  Follow up Falls evaluation completed;Falls prevention discussed Falls evaluation completed;Falls prevention discussed - - Falls evaluation completed    FALL RISK PREVENTION PERTAINING TO THE HOME:  Any stairs in or around the home? Yes  If so, are there any without handrails? No  Home free of loose throw rugs in walkways, pet beds, electrical cords, etc? Yes  Adequate lighting in your home to reduce risk of falls? Yes   ASSISTIVE DEVICES UTILIZED TO PREVENT FALLS:  Life alert? No  Use of a cane, walker or w/c? Yes  Grab bars in the bathroom? Yes  Shower chair or bench in shower? Yes  Elevated toilet seat or a handicapped toilet? No   TIMED UP AND GO:  Was the test performed? N/A telephone visit .    Cognitive Function: MMSE - Mini Mental State Exam 10/27/2020 11/12/2019 10/01/2019  09/28/2018 12/17/2017  Not completed: Unable to complete - - - -  Orientation to time - 5 5 5 4   Orientation to Place - 5 5 5 5   Registration - 3 3 3 3   Attention/ Calculation - 3 5 0 5  Recall - 3 3 2 3   Recall-comments - - - unable to recall 1 of 3 words -  Language- name 2 objects - 2 - 0 2  Language- repeat - 1 1 1 1   Language- follow 3 step command - 3 - 3 3  Language- follow 3 step command-comments - - - - -  Language- read & follow direction - 1 - 0 1  Write a sentence - 0 - 0 1  Copy design - 0 - 0 1  Total score - 26 - 19 29  Mini Cog  Mini-Cog screen was not completed. Patient had stroke that affected his memory and speech. Maximum score is 22. A value of 0 denotes this  part of the MMSE was not completed or the patient failed this part of the Mini-Cog screening.       Immunizations Immunization History  Administered Date(s) Administered  . Influenza Split 07/10/2011, 05/28/2012  . Influenza Whole 06/08/2008, 05/29/2009, 05/22/2010  . Influenza, High Dose Seasonal PF 06/09/2018, 05/30/2020  . Influenza,inj,Quad PF,6+ Mos 06/13/2017, 06/09/2018, 05/04/2019  . Influenza-Unspecified 06/30/2015, 07/12/2016, 06/13/2017, 06/09/2018, 05/04/2019  . Moderna Sars-Covid-2 Vaccination 07/21/2020  . PFIZER(Purple Top)SARS-COV-2 Vaccination 11/04/2019, 12/02/2019  . Pneumococcal Conjugate-13 09/09/2014  . Pneumococcal Polysaccharide-23 02/19/2010, 01/03/2016  . Td 11/24/2006  . Tdap 12/13/2016  . Zoster 12/22/2009  . Zoster Recombinat (Shingrix) 04/22/2020, 06/27/2020    TDAP status: Up to date  Flu Vaccine status: Up to date  Pneumococcal vaccine status: Up to date  Covid-19 vaccine status: Completed vaccines  Qualifies for Shingles Vaccine? Yes   Zostavax completed Yes   Shingrix Completed?: Yes  Screening Tests Health Maintenance  Topic Date Due  . HEMOGLOBIN A1C  09/30/2020  . COVID-19 Vaccine (4 - Booster) 01/18/2021  . FOOT EXAM  04/25/2021  . OPHTHALMOLOGY  EXAM  07/06/2021  . COLONOSCOPY (Pts 45-63yrs Insurance coverage will need to be confirmed)  04/14/2022  . TETANUS/TDAP  12/14/2026  . INFLUENZA VACCINE  Completed  . Hepatitis C Screening  Completed  . PNA vac Low Risk Adult  Completed    Health Maintenance  Health Maintenance Due  Topic Date Due  . HEMOGLOBIN A1C  09/30/2020    Colorectal cancer screening: Type of screening: Colonoscopy. Completed 04/14/2012. Repeat every 10 years  Lung Cancer Screening: (Low Dose CT Chest recommended if Age 45-80 years, 30 pack-year currently smoking OR have quit w/in 15 years.) does not qualify.   Additional Screening:  Hepatitis C Screening: does qualify; Completed 10/04/2015  Vision Screening: Recommended annual ophthalmology exams for early detection of glaucoma and other disorders of the eye. Is the patient up to date with their annual eye exam?  Yes  Who is the provider or what is the name of the office in which the patient attends annual eye exams? Dr. Benay Pillow, [facility]  If pt is not established with a provider, would they like to be referred to a provider to establish care? No .   Dental Screening: Recommended annual dental exams for proper oral hygiene  Community Resource Referral / Chronic Care Management: CRR required this visit?  No   CCM required this visit?  No      Plan:     I have personally reviewed and noted the following in the patient's chart:   . Medical and social history . Use of alcohol, tobacco or illicit drugs  . Current medications and supplements . Functional ability and status . Nutritional status . Physical activity . Advanced directives . List of other physicians . Hospitalizations, surgeries, and ER visits in previous 12 months . Vitals . Screenings to include cognitive, depression, and falls . Referrals and appointments  In addition, I have reviewed and discussed with patient certain preventive protocols, quality metrics, and best  practice recommendations. A written personalized care plan for preventive services as well as general preventive health recommendations were provided to patient.   Due to this being a telephonic visit, the after visit summary with patients personalized plan was offered to patient via mail or my-chart. Patient preferred to pick up at office at next visit.   Andrez Grime, LPN   [id number]

## 2020-10-27 NOTE — Progress Notes (Signed)
PCP notes:  Health Maintenance: No gaps noted    Abnormal Screenings: none   Patient concerns: none   Nurse concerns: none   Next PCP appt: none 

## 2020-10-27 NOTE — Telephone Encounter (Signed)
Completed AWV. Patient states he needs a refill on his oxycodone and xanax sent to [facility] pharmacy. Patient also needs a physical and labs scheduled for the same day preferably.

## 2020-10-27 NOTE — Telephone Encounter (Signed)
rxs sent.  Please schedule when possible.  Thanks.

## 2020-10-27 NOTE — Patient Instructions (Signed)
Walter Harris , Thank you for taking time to come for your Medicare Wellness Visit. I appreciate your ongoing commitment to your health goals. Please review the following plan we discussed and let me know if I can assist you in the future.   Screening recommendations/referrals: Colonoscopy: Up to date, completed 04/14/2012, due 04/2022 Recommended yearly ophthalmology/optometry visit for glaucoma screening and checkup Recommended yearly dental visit for hygiene and checkup  Vaccinations: Influenza vaccine: Up to date, completed 05/30/2020, due 04/2021 Pneumococcal vaccine: Completed series Tdap vaccine: Up to date, completed 12/13/2016, due 12/2026 Shingles vaccine: Completed series   Covid-19: Completed series  Advanced directives: Please bring a copy of your POA (Power of Attorney) and/or Living Will to your next appointment.   Conditions/risks identified: diabetes, hypertension  Next appointment: Follow up in one year for your annual wellness visit.   Preventive Care 28 Years and Older, Male Preventive care refers to lifestyle choices and visits with your health care provider that can promote health and wellness. What does preventive care include?  A yearly physical exam. This is also called an annual well check.  Dental exams once or twice a year.  Routine eye exams. Ask your health care provider how often you should have your eyes checked.  Personal lifestyle choices, including:  Daily care of your teeth and gums.  Regular physical activity.  Eating a healthy diet.  Avoiding tobacco and drug use.  Limiting alcohol use.  Practicing safe sex.  Taking low doses of aspirin every day.  Taking vitamin and mineral supplements as recommended by your health care provider. What happens during an annual well check? The services and screenings done by your health care provider during your annual well check will depend on your age, overall health, lifestyle risk factors, and family  history of disease. Counseling  Your health care provider may ask you questions about your:  Alcohol use.  Tobacco use.  Drug use.  Emotional well-being.  Home and relationship well-being.  Sexual activity.  Eating habits.  History of falls.  Memory and ability to understand (cognition).  Work and work Statistician. Screening  You may have the following tests or measurements:  Height, weight, and BMI.  Blood pressure.  Lipid and cholesterol levels. These may be checked every 5 years, or more frequently if you are over 89 years old.  Skin check.  Lung cancer screening. You may have this screening every year starting at age 19 if you have a 30-pack-year history of smoking and currently smoke or have quit within the past 15 years.  Fecal occult blood test (FOBT) of the stool. You may have this test every year starting at age 38.  Flexible sigmoidoscopy or colonoscopy. You may have a sigmoidoscopy every 5 years or a colonoscopy every 10 years starting at age 11.  Prostate cancer screening. Recommendations will vary depending on your family history and other risks.  Hepatitis C blood test.  Hepatitis B blood test.  Sexually transmitted disease (STD) testing.  Diabetes screening. This is done by checking your blood sugar (glucose) after you have not eaten for a while (fasting). You may have this done every 1-3 years.  Abdominal aortic aneurysm (AAA) screening. You may need this if you are a current or former smoker.  Osteoporosis. You may be screened starting at age 15 if you are at high risk. Talk with your health care provider about your test results, treatment options, and if necessary, the need for more tests. Vaccines  Your health care  provider may recommend certain vaccines, such as:  Influenza vaccine. This is recommended every year.  Tetanus, diphtheria, and acellular pertussis (Tdap, Td) vaccine. You may need a Td booster every 10 years.  Zoster vaccine.  You may need this after age 77.  Pneumococcal 13-valent conjugate (PCV13) vaccine. One dose is recommended after age 46.  Pneumococcal polysaccharide (PPSV23) vaccine. One dose is recommended after age 21. Talk to your health care provider about which screenings and vaccines you need and how often you need them. This information is not intended to replace advice given to you by your health care provider. Make sure you discuss any questions you have with your health care provider. Document Released: 09/22/2015 Document Revised: 05/15/2016 Document Reviewed: 06/27/2015 Elsevier Interactive Patient Education  2017 [place] Prevention in the Home Falls can cause injuries. They can happen to people of all ages. There are many things you can do to make your home safe and to help prevent falls. What can I do on the outside of my home?  Regularly fix the edges of walkways and driveways and fix any cracks.  Remove anything that might make you trip as you walk through a door, such as a raised step or threshold.  Trim any bushes or trees on the path to your home.  Use bright outdoor lighting.  Clear any walking paths of anything that might make someone trip, such as rocks or tools.  Regularly check to see if handrails are loose or broken. Make sure that both sides of any steps have handrails.  Any raised decks and porches should have guardrails on the edges.  Have any leaves, snow, or ice cleared regularly.  Use sand or salt on walking paths during winter.  Clean up any spills in your garage right away. This includes oil or grease spills. What can I do in the bathroom?  Use night lights.  Install grab bars by the toilet and in the tub and shower. Do not use towel bars as grab bars.  Use non-skid mats or decals in the tub or shower.  If you need to sit down in the shower, use a plastic, non-slip stool.  Keep the floor dry. Clean up any water that spills on the floor as soon  as it happens.  Remove soap buildup in the tub or shower regularly.  Attach bath mats securely with double-sided non-slip rug tape.  Do not have throw rugs and other things on the floor that can make you trip. What can I do in the bedroom?  Use night lights.  Make sure that you have a light by your bed that is easy to reach.  Do not use any sheets or blankets that are too big for your bed. They should not hang down onto the floor.  Have a firm chair that has side arms. You can use this for support while you get dressed.  Do not have throw rugs and other things on the floor that can make you trip. What can I do in the kitchen?  Clean up any spills right away.  Avoid walking on wet floors.  Keep items that you use a lot in easy-to-reach places.  If you need to reach something above you, use a strong step stool that has a grab bar.  Keep electrical cords out of the way.  Do not use floor polish or wax that makes floors slippery. If you must use wax, use non-skid floor wax.  Do not have throw  rugs and other things on the floor that can make you trip. What can I do with my stairs?  Do not leave any items on the stairs.  Make sure that there are handrails on both sides of the stairs and use them. Fix handrails that are broken or loose. Make sure that handrails are as long as the stairways.  Check any carpeting to make sure that it is firmly attached to the stairs. Fix any carpet that is loose or worn.  Avoid having throw rugs at the top or bottom of the stairs. If you do have throw rugs, attach them to the floor with carpet tape.  Make sure that you have a light switch at the top of the stairs and the bottom of the stairs. If you do not have them, ask someone to add them for you. What else can I do to help prevent falls?  Wear shoes that:  Do not have high heels.  Have rubber bottoms.  Are comfortable and fit you well.  Are closed at the toe. Do not wear sandals.  If  you use a stepladder:  Make sure that it is fully opened. Do not climb a closed stepladder.  Make sure that both sides of the stepladder are locked into place.  Ask someone to hold it for you, if possible.  Clearly mark and make sure that you can see:  Any grab bars or handrails.  First and last steps.  Where the edge of each step is.  Use tools that help you move around (mobility aids) if they are needed. These include:  Canes.  Walkers.  Scooters.  Crutches.  Turn on the lights when you go into a dark area. Replace any light bulbs as soon as they burn out.  Set up your furniture so you have a clear path. Avoid moving your furniture around.  If any of your floors are uneven, fix them.  If there are any pets around you, be aware of where they are.  Review your medicines with your doctor. Some medicines can make you feel dizzy. This can increase your chance of falling. Ask your doctor what other things that you can do to help prevent falls. This information is not intended to replace advice given to you by your health care provider. Make sure you discuss any questions you have with your health care provider. Document Released: 06/22/2009 Document Revised: 02/01/2016 Document Reviewed: 09/30/2014 Elsevier Interactive Patient Education  2017 [employer].

## 2020-10-30 NOTE — Telephone Encounter (Signed)
Called pt and got him scheduled for 3/4 at 830

## 2020-11-01 NOTE — Telephone Encounter (Signed)
Thanks

## 2020-11-10 ENCOUNTER — Other Ambulatory Visit: Payer: Self-pay

## 2020-11-10 ENCOUNTER — Ambulatory Visit (INDEPENDENT_AMBULATORY_CARE_PROVIDER_SITE_OTHER): Payer: PPO | Admitting: Family Medicine

## 2020-11-10 ENCOUNTER — Encounter: Payer: Self-pay | Admitting: Family Medicine

## 2020-11-10 VITALS — BP 122/68 | HR 76 | Temp 98.1°F | Ht 68.0 in | Wt 188.0 lb

## 2020-11-10 DIAGNOSIS — E1142 Type 2 diabetes mellitus with diabetic polyneuropathy: Secondary | ICD-10-CM

## 2020-11-10 DIAGNOSIS — I10 Essential (primary) hypertension: Secondary | ICD-10-CM | POA: Diagnosis not present

## 2020-11-10 DIAGNOSIS — M549 Dorsalgia, unspecified: Secondary | ICD-10-CM | POA: Diagnosis not present

## 2020-11-10 DIAGNOSIS — E1165 Type 2 diabetes mellitus with hyperglycemia: Secondary | ICD-10-CM | POA: Diagnosis not present

## 2020-11-10 DIAGNOSIS — M25512 Pain in left shoulder: Secondary | ICD-10-CM

## 2020-11-10 DIAGNOSIS — IMO0002 Reserved for concepts with insufficient information to code with codable children: Secondary | ICD-10-CM

## 2020-11-10 DIAGNOSIS — G8929 Other chronic pain: Secondary | ICD-10-CM

## 2020-11-10 DIAGNOSIS — F411 Generalized anxiety disorder: Secondary | ICD-10-CM | POA: Diagnosis not present

## 2020-11-10 DIAGNOSIS — G250 Essential tremor: Secondary | ICD-10-CM | POA: Diagnosis not present

## 2020-11-10 LAB — CBC WITH DIFFERENTIAL/PLATELET
Basophils Absolute: 0.1 10*3/uL (ref 0.0–0.1)
Basophils Relative: 0.8 % (ref 0.0–3.0)
Eosinophils Absolute: 0.2 10*3/uL (ref 0.0–0.7)
Eosinophils Relative: 3.4 % (ref 0.0–5.0)
HCT: 41.4 % (ref 39.0–52.0)
Hemoglobin: 13.8 g/dL (ref 13.0–17.0)
Lymphocytes Relative: 14.5 % (ref 12.0–46.0)
Lymphs Abs: 0.9 10*3/uL (ref 0.7–4.0)
MCHC: 33.2 g/dL (ref 30.0–36.0)
MCV: 91.5 fl (ref 78.0–100.0)
Monocytes Absolute: 0.5 10*3/uL (ref 0.1–1.0)
Monocytes Relative: 7.7 % (ref 3.0–12.0)
Neutro Abs: 4.6 10*3/uL (ref 1.4–7.7)
Neutrophils Relative %: 73.6 % (ref 43.0–77.0)
Platelets: 226 10*3/uL (ref 150.0–400.0)
RBC: 4.53 Mil/uL (ref 4.22–5.81)
RDW: 13.6 % (ref 11.5–15.5)
WBC: 6.3 10*3/uL (ref 4.0–10.5)

## 2020-11-10 LAB — COMPREHENSIVE METABOLIC PANEL
ALT: 19 U/L (ref 0–53)
AST: 18 U/L (ref 0–37)
Albumin: 4.2 g/dL (ref 3.5–5.2)
Alkaline Phosphatase: 58 U/L (ref 39–117)
BUN: 20 mg/dL (ref 6–23)
CO2: 26 mEq/L (ref 19–32)
Calcium: 9.4 mg/dL (ref 8.4–10.5)
Chloride: 101 mEq/L (ref 96–112)
Creatinine, Ser: 1.33 mg/dL (ref 0.40–1.50)
GFR: 52.87 mL/min — ABNORMAL LOW (ref >60.00)
Glucose, Bld: 204 mg/dL — ABNORMAL HIGH (ref 70–99)
Potassium: 4.9 mEq/L (ref 3.5–5.1)
Sodium: 138 mEq/L (ref 135–145)
Total Bilirubin: 0.8 mg/dL (ref 0.2–1.2)
Total Protein: 6.7 g/dL (ref 6.0–8.3)

## 2020-11-10 LAB — LIPID PANEL
Cholesterol: 199 mg/dL (ref 0–200)
HDL: 39.3 mg/dL (ref >39.00)
LDL Cholesterol: 121 mg/dL — ABNORMAL HIGH (ref 0–99)
NonHDL: 159.52
Total CHOL/HDL Ratio: 5
Triglycerides: 195 mg/dL — ABNORMAL HIGH (ref 0.0–149.0)
VLDL: 39 mg/dL (ref 0.0–40.0)

## 2020-11-10 LAB — HEMOGLOBIN A1C: Hgb A1c MFr Bld: 9.2 % — ABNORMAL HIGH (ref 4.6–6.5)

## 2020-11-10 LAB — TSH: TSH: 1.46 u[IU]/mL (ref 0.35–4.50)

## 2020-11-10 MED ORDER — TRAZODONE HCL 50 MG PO TABS
50.0000 mg | ORAL_TABLET | Freq: Every evening | ORAL | Status: DC | PRN
Start: 2020-11-10 — End: 2021-03-07

## 2020-11-10 MED ORDER — LANTUS SOLOSTAR 100 UNIT/ML ~~LOC~~ SOPN: 21.0000 [IU] | PEN_INJECTOR | Freq: Every day | SUBCUTANEOUS | Status: DC

## 2020-11-10 NOTE — Patient Instructions (Addendum)
Endo appointment 11/30/20. I'll await those notes.  We'll call about seeing ortho.    Take care.  Glad to see you. Go to the lab on the way out.   If you have mychart we'll likely use that to update you.    Update me as needed.

## 2020-11-10 NOTE — Progress Notes (Signed)
This visit occurred during the SARS-CoV-2 public health emergency.  Safety protocols were in place, including screening questions prior to the visit, additional usage of staff PPE, and extensive cleaning of exam room while observing appropriate contact time as indicated for disinfecting solutions.  L shoulder pain.  Noted since prev fall, walking with cane.  Cautions d/w pt.  Restriction and discomfort on L shoulder external rotation.    D/w pt about mild contraction L hand.  Discussed Ortho clinic referral.  Referral already placed regarding shoulder.  Indication for chronic opioid: Chronic neck and back pain Medication and dose: oxycodone 5/325mg  # pills per month: #180 per month Last UDS date: 03/14/16 Pain contract signed (Y/N): yes Date narcotic database last reviewed (include red flags): 11/10/20   No sedation on meds.  He is putting up with pain.  Some days are better than others.  No new sx.    Dm2 per endo.  Dr. Sol Blazing; [facility] yearly.  Endo pending f/u for 11/30/20.  Sugar usually ~115 in the AMs.  No lows.  See notes on follow-up labs.  Has been seeing Dr. Darrick Huntsman dermatology.  Has routine visits.    Mood d/w pt.  Still on SSRI.  Compliant.  Mood is good on med per patient and wife. We agreed to continue as is.    Tremor.  On beta blocker.  He is still putting up with tremor.  He can still eat and is maintaining.  He'll update me as needed.    Hypertension:    Using medication without problems or lightheadedness: yes Chest pain with exertion:no Edema:no Short of breath:no  Meds, vitals, and allergies reviewed.   ROS: Per HPI unless specifically indicated in ROS section   GEN: nad, alert and oriented HEENT: NCAT NECK: supple w/o LA CV: rrr PULM: ctab, no inc wob ABD: soft, +bs EXT: no edema SKIN: no acute rash Hand tremor and speech at baseline. Mild contracture noted left hand.  Pain with external rotation left shoulder.  No arm drop.

## 2020-11-11 DIAGNOSIS — M25512 Pain in left shoulder: Secondary | ICD-10-CM | POA: Insufficient documentation

## 2020-11-11 NOTE — Assessment & Plan Note (Signed)
Dm2 per endo.  Dr. Sol Blazing; [facility] yearly.  Endo pending f/u for 11/30/20.  Sugar usually ~115 in the AMs.  No lows.  See notes on follow-up labs.  Continue Metformin glipizide insulin and Invokana.  We will route labs to endocrine.

## 2020-11-11 NOTE — Assessment & Plan Note (Signed)
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  Refer to orthopedics.  I would also like orthopedic input on his mild pain contracture.

## 2020-11-11 NOTE — Assessment & Plan Note (Signed)
Still on SSRI.  Compliant.  Mood is good on med per patient and wife. We agreed to continue as is.  He agrees.

## 2020-11-11 NOTE — Assessment & Plan Note (Signed)
On beta blocker.  He is still putting up with tremor.  He can still eat and is maintaining.  He'll update me as needed.   Continue beta-blocker.

## 2020-11-11 NOTE — Assessment & Plan Note (Signed)
Continue propranolol.  See notes on follow-up labs.

## 2020-11-11 NOTE — Assessment & Plan Note (Signed)
Indication for chronic opioid: Chronic neck and back pain Medication and dose: oxycodone 5/325mg  # pills per month: #180 per month Last UDS date: 03/14/16 Pain contract signed (Y/N): yes Date narcotic database last reviewed (include red flags): 11/10/20   No sedation on meds.  He is putting up with pain.  Some days are better than others.  No new sx. would continue with Percocet as is.  He agrees.

## 2020-11-23 DIAGNOSIS — M25512 Pain in left shoulder: Secondary | ICD-10-CM | POA: Diagnosis not present

## 2020-11-29 ENCOUNTER — Telehealth: Payer: Self-pay

## 2020-11-29 NOTE — Chronic Care Management (AMB) (Addendum)
Chronic Care Management Pharmacy Assistant   Name: Walter Harris  MRN: [id number] DOB: 11/24/1945  Reason for Encounter: Disease State- Diabetes and Hypertension   Conditions to be addressed/monitored: HTN and DMII  Recent office visits:  11/10/20 Dr. [name]- increased lantus from 19 units to 21 units daily.   Recent consult visits:  No consults since last CCM visit  Hospital visits:  Medication Reconciliation was completed by comparing discharge summary, patient's EMR and Pharmacy list, and upon discussion with patient.  Admitted to the hospital on 07/31/20 due to contusion of left shoulder. Discharge date was 07/31/20. Discharged from [facility].    New?Medications Started at [facility] Discharge:?? -N/A  Medication Changes at Hospital Discharge: -N/A  Medications Discontinued at Hospital Discharge: -N/A  Medications that remain the same after Hospital Discharge:??  -All other medications will remain the same.    Medications: Outpatient Encounter Medications as of 11/29/2020  Medication Sig   ALPRAZolam (XANAX) 0.5 MG tablet 1 tab twice a day with extra 1/2 tab if needed.  Sedation caution   aspirin 81 MG EC tablet Take 1 tablet (81 mg total) by mouth daily. For stroke prevention   BD PEN NEEDLE NANO U/F 32G X 4 MM MISC USE AS DIRECTED   canagliflozin (INVOKANA) 300 MG TABS tablet Take 1 tablet (300 mg total) by mouth daily before breakfast. For diabetes   clopidogrel (PLAVIX) 75 MG tablet TAKE 1 TABLET BY MOUTH DAILY   FLUoxetine (PROZAC) 10 MG capsule Take 3 capsules (30 mg total) by mouth daily. For mood.   glipiZIDE (GLUCOTROL XL) 10 MG 24 hr tablet Take 1 tablet (10 mg total) by mouth daily with breakfast. For diabetes   insulin glargine (LANTUS SOLOSTAR) 100 UNIT/ML Solostar Pen Inject 21 Units into the skin daily. For diabetes   irbesartan (AVAPRO) 150 MG tablet Take 1 tablet (150 mg total) by mouth daily. For blood pressure   metFORMIN (GLUCOPHAGE-XR) 500 MG  24 hr tablet TAKE ONE TABLET 3 TIMES DAILY for diabetes   oxyCODONE-acetaminophen (PERCOCET/ROXICET) 5-325 MG tablet TAKE ONE TO TWO TABLETS BY MOUTH 3 TIMES DAILY IF NEEDED FOR PAIN. Fill on/after 12/24/20   primidone (MYSOLINE) 50 MG tablet Take 1 tablet (50 mg total) by mouth daily. For tremor   propranolol ER (INDERAL LA) 60 MG 24 hr capsule TAKE 1 CAPSULE EVERY DAY   tiZANidine (ZANAFLEX) 4 MG tablet TAKE ONE TABLET 3 TIMES DAILY AS NEEDED FOR MUSCLE SPASM MAY TAKE 1 EXTRADOSE DAILY AS NEEDED   traZODone (DESYREL) 50 MG tablet Take 1-2 tablets (50-100 mg total) by mouth at bedtime as needed for sleep.   No facility-administered encounter medications on file as of 11/29/2020.      Recent Relevant Labs: Lab Results  Component Value Date/Time   HGBA1C 9.2 (H) 11/10/2020 09:34 AM   HGBA1C 9.0 03/30/2020 12:00 AM   HGBA1C 9.6 (H) 12/07/2019 04:29 AM   MICROALBUR 5.6 (H) 02/28/2015 09:19 AM   MICROALBUR 5.9 (H) 02/19/2010 03:44 PM    Kidney Function Lab Results  Component Value Date/Time   CREATININE 1.33 11/10/2020 09:34 AM   CREATININE 0.99 12/07/2019 04:29 AM   CREATININE 1.1 09/29/2019 12:00 AM   CREATININE 1.20 (H) 02/13/2018 03:20 PM   CREATININE 1.01 09/13/2013 03:25 PM   GFR 52.87 (L) 11/10/2020 09:34 AM   GFRNONAA >60 12/07/2019 04:29 AM   GFRNONAA >60 09/13/2013 03:25 PM   GFRAA >60 12/07/2019 04:29 AM   GFRAA >60 09/13/2013 03:25 PM  Diabetes  Patient is followed by endocrinology - appt on 3/24 (tomorrow)  Current antihyperglycemic regimen:  Invokana 300 mg - 1/2 tablet daily Glipizide 10 mg XL - 1 tablet daily  Lantus Solostar - 22 units - evening Metformin 500 mg - 1 tablet three times daily   Patient verbally confirms he is taking the above medications as directed. Yes  What recent interventions/DTPs have been made to improve glycemic control:  11/10/20 Dr. [name]- increased lantus from 19 units to 21 units daily. Patient now at 22 units.  Have there been  any recent hospitalizations or ED visits since last visit with CPP? No  Patient denies hypoglycemic symptoms, including Pale, Sweaty, Shaky, Hungry, Nervous/irritable and Vision changes  Patient denies hyperglycemic symptoms, including blurry vision, excessive thirst, fatigue, polyuria and weakness  How often are you checking your blood sugar? once daily  What are your blood sugars ranging?  Fasting:  11/24/20- 143 (had cortisone injection day before) 11/25/20- 132 11/26/20- 127 11/27/20- 111 11/28/20- 90 Before meals: N/A After meals: N/A Bedtime: N/A  On insulin? Yes Lantus How many units: 22 units in the evening  During the week, how often does your blood glucose drop below 70? Never  Are you checking your feet daily/regularly? Yes- states feet look good. No wounds or sores.   Adherence Review: Is the patient currently on a STATIN medication? No - unable to tolerate Is the patient currently on ACE/ARB medication? Yes Does the patient have >5 day gap between last estimated fill dates? Invokana past due as rx is for 1 tablet daily and patient is only taking 1/2 tablet daily (endo aware)   Star Rating Drugs:  Medication:  Last Fill: Day Supply Invokana 300 mg 10/24/20 30 Glipizide 10 mg 10/26/20 90 Irbesartan 150 mg 11/16/20 90 Metformin 500 mg 10/12/20  90  Recent Office Vitals: BP Readings from Last 3 Encounters:  11/10/20 122/68  08/08/20 140/88  07/31/20 131/75   Pulse Readings from Last 3 Encounters:  11/10/20 76  08/08/20 80  07/31/20 71    Wt Readings from Last 3 Encounters:  11/10/20 188 lb (85.3 kg)  08/08/20 190 lb (86.2 kg)  06/13/20 189 lb 6 oz (85.9 kg)     Kidney Function Lab Results  Component Value Date/Time   CREATININE 1.33 11/10/2020 09:34 AM   CREATININE 0.99 12/07/2019 04:29 AM   CREATININE 1.1 09/29/2019 12:00 AM   CREATININE 1.20 (H) 02/13/2018 03:20 PM   CREATININE 1.01 09/13/2013 03:25 PM   GFR 52.87 (L) 11/10/2020 09:34 AM   GFRNONAA  >60 12/07/2019 04:29 AM   GFRNONAA >60 09/13/2013 03:25 PM   GFRAA >60 12/07/2019 04:29 AM   GFRAA >60 09/13/2013 03:25 PM    BMP Latest Ref Rng & Units 11/10/2020 12/07/2019 12/06/2019  Glucose 70 - 99 mg/dL 204(H) 85 135(H)  BUN 6 - 23 mg/dL 20 18 22   Creatinine 0.40 - 1.50 mg/dL 1.33 0.99 1.19  BUN/Creat Ratio 6 - 22 (calc) - - -  Sodium 135 - 145 mEq/L 138 141 137  Potassium 3.5 - 5.1 mEq/L 4.9 3.7 4.3  Chloride 96 - 112 mEq/L 101 108 101  CO2 19 - 32 mEq/L 26 23 23   Calcium 8.4 - 10.5 mg/dL 9.4 8.9 9.5   Hypertension  Current antihypertensive regimen:  Irbesartan 150 mg - 1 tablet daily  Patient verbally confirms he is taking the above medications as directed. Yes  How often are you checking your Blood Pressure? Not checking blood pressure  Wrist or arm cuff: N/A Caffeine intake: 1/2 cup coffee Salt intake: No salt added to food OTC medications including pseudoephedrine or NSAIDs? No  Any readings above 180/120? Unknown If yes any symptoms of hypertensive emergency? patient denies any symptoms of high blood pressure   What recent interventions/DTPs have been made by any provider to improve Blood Pressure control since last CPP Visit: No changes or interventions  Any recent hospitalizations or ED visits since last visit with CPP? No  What diet changes have been made to improve Blood Pressure Control?  No changes in diets. Limits salt  What exercise is being done to improve your Blood Pressure Control?  States he tries to do things around the house. No formal exercise   Patient states he is doing well. Has appointment with Dr. Honor Junes (endocrinology) tomorrow 11/30/20. Denies any recent falls.  Follow-Up:  Pharmacist Review  Debbora Dus, CPP notified  Margaretmary Dys, [place] Assistant [phone]   I have reviewed the care management and care coordination activities outlined in this encounter and I am certifying that I agree with the  content of this note. Reviewed adherence, no concerns. Reviewed endo note from 3/24 - pt started on Zetia for cholesterol. Pt on 24 units Lantus daily as of 11/30/20. No diabetes medication changes per endo. Next CCM visit scheduled for June 2022.  Debbora Dus, PharmD Clinical Pharmacist [facility] [phone]

## 2020-11-30 DIAGNOSIS — E1169 Type 2 diabetes mellitus with other specified complication: Secondary | ICD-10-CM | POA: Diagnosis not present

## 2020-11-30 DIAGNOSIS — E785 Hyperlipidemia, unspecified: Secondary | ICD-10-CM | POA: Diagnosis not present

## 2020-11-30 DIAGNOSIS — Z794 Long term (current) use of insulin: Secondary | ICD-10-CM | POA: Diagnosis not present

## 2020-11-30 DIAGNOSIS — E1165 Type 2 diabetes mellitus with hyperglycemia: Secondary | ICD-10-CM | POA: Diagnosis not present

## 2020-12-21 ENCOUNTER — Other Ambulatory Visit: Payer: Self-pay | Admitting: Family Medicine

## 2021-01-04 DIAGNOSIS — X32XXXA Exposure to sunlight, initial encounter: Secondary | ICD-10-CM | POA: Diagnosis not present

## 2021-01-04 DIAGNOSIS — D2261 Melanocytic nevi of right upper limb, including shoulder: Secondary | ICD-10-CM | POA: Diagnosis not present

## 2021-01-04 DIAGNOSIS — D225 Melanocytic nevi of trunk: Secondary | ICD-10-CM | POA: Diagnosis not present

## 2021-01-04 DIAGNOSIS — L570 Actinic keratosis: Secondary | ICD-10-CM | POA: Diagnosis not present

## 2021-01-04 DIAGNOSIS — D2272 Melanocytic nevi of left lower limb, including hip: Secondary | ICD-10-CM | POA: Diagnosis not present

## 2021-01-04 DIAGNOSIS — Z85828 Personal history of other malignant neoplasm of skin: Secondary | ICD-10-CM | POA: Diagnosis not present

## 2021-01-29 ENCOUNTER — Telehealth: Payer: Self-pay | Admitting: Family Medicine

## 2021-01-29 NOTE — Telephone Encounter (Signed)
Patient wife called in stating that the patient has a rash in his groin area. Dr Silvio Pate prescribed it last in 2013. Patients wife is asking for the same cream. Please advise if we can get it for him or he will need to be seen since it was last prescribed in 2013 .  Name: Ketoconazole 2% topical cream Pharmacy: [facility]

## 2021-01-31 MED ORDER — KETOCONAZOLE 2 % EX CREA: 1.0000 "application " | TOPICAL_CREAM | Freq: Every day | CUTANEOUS | 0 refills | Status: DC

## 2021-01-31 NOTE — Addendum Note (Signed)
Addended by: [name] on: 01/31/2021 04:30 PM   Modules accepted: Orders

## 2021-01-31 NOTE — Telephone Encounter (Signed)
I sent the prescription.  If he has a fever or if he is getting worse in the meantime or if he has other systemic symptoms then he needs to be seen.  Thanks.

## 2021-02-01 NOTE — Telephone Encounter (Signed)
Patient advised rx was sent and to call back if gets fever or any worsening sx. Also wants to know if it is advised by Dr. [name] for him to get the 4 covid vaccine?

## 2021-02-02 NOTE — Telephone Encounter (Signed)
Based on record review, appears appropriate to get 4th covid vaccine.  Thanks.

## 2021-02-02 NOTE — Telephone Encounter (Signed)
Patient advised okay to get 4th covid vaccine.

## 2021-02-07 ENCOUNTER — Telehealth: Payer: Self-pay

## 2021-02-07 NOTE — Chronic Care Management (AMB) (Addendum)
Chronic Care Management Pharmacy Assistant   Name: Walter Harris  MRN: [id number] DOB: Jun 09, 1946   Reason for Encounter: Disease State DM   Recent office visits:  01/29/21 - PCP - Started Ketoconazole 2% topical cream apply topically daily   Recent consult visits:  11/30/20 - Endocrinology - No medication changes follow up 6 months  Hospital visits:  None in previous 6 months  Medications: Outpatient Encounter Medications as of 02/07/2021  Medication Sig   ALPRAZolam (XANAX) 0.5 MG tablet 1 tab twice a day with extra 1/2 tab if needed.  Sedation caution   aspirin 81 MG EC tablet Take 1 tablet (81 mg total) by mouth daily. For stroke prevention   BD PEN NEEDLE NANO U/F 32G X 4 MM MISC USE AS DIRECTED   canagliflozin (INVOKANA) 300 MG TABS tablet Take 1 tablet (300 mg total) by mouth daily before breakfast. For diabetes   clopidogrel (PLAVIX) 75 MG tablet TAKE 1 TABLET BY MOUTH DAILY   FLUoxetine (PROZAC) 10 MG capsule Take 3 capsules (30 mg total) by mouth daily. For mood.   glipiZIDE (GLUCOTROL XL) 10 MG 24 hr tablet Take 1 tablet (10 mg total) by mouth daily with breakfast. For diabetes   insulin glargine (LANTUS SOLOSTAR) 100 UNIT/ML Solostar Pen Inject 21 Units into the skin daily. For diabetes   irbesartan (AVAPRO) 150 MG tablet Take 1 tablet (150 mg total) by mouth daily. For blood pressure   ketoconazole (NIZORAL) 2 % cream Apply 1 application topically daily.   metFORMIN (GLUCOPHAGE-XR) 500 MG 24 hr tablet TAKE ONE TABLET 3 TIMES DAILY for diabetes   oxyCODONE-acetaminophen (PERCOCET/ROXICET) 5-325 MG tablet TAKE ONE TO TWO TABLETS BY MOUTH 3 TIMES DAILY IF NEEDED FOR PAIN. Fill on/after 12/24/20   primidone (MYSOLINE) 50 MG tablet Take 1 tablet (50 mg total) by mouth daily. For tremor   propranolol ER (INDERAL LA) 60 MG 24 hr capsule TAKE 1 CAPSULE EVERY DAY   tiZANidine (ZANAFLEX) 4 MG tablet TAKE ONE TABLET 3 TIMES DAILY AS NEEDED FOR MUSCLE SPASM MAY TAKE 1 EXTRADOSE  DAILY AS NEEDED   traZODone (DESYREL) 50 MG tablet Take 1-2 tablets (50-100 mg total) by mouth at bedtime as needed for sleep.   No facility-administered encounter medications on file as of 02/07/2021.   Recent Relevant Labs: Lab Results  Component Value Date/Time   HGBA1C 9.2 (H) 11/10/2020 09:34 AM   HGBA1C 9.0 03/30/2020 12:00 AM   HGBA1C 9.6 (H) 12/07/2019 04:29 AM   MICROALBUR 5.6 (H) 02/28/2015 09:19 AM   MICROALBUR 5.9 (H) 02/19/2010 03:44 PM    Kidney Function Lab Results  Component Value Date/Time   CREATININE 1.33 11/10/2020 09:34 AM   CREATININE 0.99 12/07/2019 04:29 AM   CREATININE 1.1 09/29/2019 12:00 AM   CREATININE 1.20 (H) 02/13/2018 03:20 PM   CREATININE 1.01 09/13/2013 03:25 PM   GFR 52.87 (L) 11/10/2020 09:34 AM   GFRNONAA >60 12/07/2019 04:29 AM   GFRNONAA >60 09/13/2013 03:25 PM   GFRAA >60 12/07/2019 04:29 AM   GFRAA >60 09/13/2013 03:25 PM   Current antihyperglycemic regimen:  Invokana 300 mg - 1/2 tablet daily Glipizide 10 mg XL - 1 tablet daily  Lantus Solostar - 20 units - evening Metformin 500 mg - 1 tablet three times daily    Patient verbally confirms he is taking the above medications as directed. Yes  What recent interventions/DTPs have been made to improve glycemic control:  The patient reports Dr.Duncan said to add 1  unit of lantus here or there when needed   Have there been any recent hospitalizations or ED visits since last visit with CPP? No  Patient denies hypoglycemic symptoms, including Pale, Sweaty, Shaky, Hungry, Nervous/irritable and Vision changes  Patient denies hyperglycemic symptoms, including blurry vision, excessive thirst, fatigue, polyuria and weakness  How often are you checking your blood sugar? in the morning before eating or drinking  What are your blood sugars ranging?  Fasting:      01/27/21-104 01/28/21-114 01/29/21-112 01/30/21-103 01/31/21-108 02/01/21-104 02/02/21-119 02/03/21-111 02/04/21-110 02/05/21-114  Before meals: n/a After meals: n/a Bedtime: n/a  On insulin? Yes How many units:Lantus Solostar - 20 units - evening the patient reports he uses 20 units in the evening  During the week, how often does your blood glucose drop below 70? Never  Are you checking your feet daily/regularly? Yes the patient reports daily lotion application   Adherence Review: Is the patient currently on a STATIN medication? No Is the patient currently on ACE/ARB medication? Yes Does the patient have >5 day gap between last estimated fill dates? Yes - Invokana taken differently than prescribed. Will verify/discuss at next OV.  Star Rating Drugs:  Medication:  Last Fill: Day Supply Glipizide 10mg   01/23/21 90 Irbesartan 150mg  11/16/20 90 Metformin 500mg  01/10/21  90 Invokana 300mg  12/21/20 30  The patient was reminded of follow up appointment with Debbora Dus CPP on 03/06/2021 at 3:00pm.  Follow-Up:  Pharmacist Review  Debbora Dus, CPP notified  [place] Assistant [phone]  I have reviewed the care management and care coordination activities outlined in this encounter and I am certifying that I agree with the content of this note. No further action required.  Debbora Dus, PharmD Clinical Pharmacist [facility] at [facility] [phone]

## 2021-02-16 ENCOUNTER — Other Ambulatory Visit: Payer: Self-pay | Admitting: Family Medicine

## 2021-02-16 NOTE — Telephone Encounter (Signed)
Last filled 01-10-21 #75 Last OV 11-10-20 Next OV 11-12-21 [facility]

## 2021-02-18 NOTE — Telephone Encounter (Signed)
Sent. Thanks.   

## 2021-02-19 ENCOUNTER — Telehealth: Payer: Self-pay | Admitting: Family Medicine

## 2021-02-19 NOTE — Telephone Encounter (Signed)
Refill request for Xanax and Oxycodone  LOV - 02/16/21 Next OV - 02/20/21 Last refill - Looks like xanax was refilled yesterday and oxycodone was last refilled 10/27/20 #180/0

## 2021-02-19 NOTE — Telephone Encounter (Signed)
  LAST APPOINTMENT DATE: 02/16/2021   NEXT APPOINTMENT DATE:@6 /14/2022  MEDICATION: xanax, and oxycodone  [facility]   Let patient know to contact pharmacy at the end of the day to make sure medication is ready.  Please notify patient to allow 48-72 hours to process  Encourage patient to contact the pharmacy for refills or they can request refills through [place]:   LAST REFILL:  QTY:  REFILL DATE:    OTHER COMMENTS:    Okay for refill?  Please advise

## 2021-02-20 ENCOUNTER — Ambulatory Visit (INDEPENDENT_AMBULATORY_CARE_PROVIDER_SITE_OTHER): Payer: PPO | Admitting: Family Medicine

## 2021-02-20 ENCOUNTER — Other Ambulatory Visit: Payer: Self-pay

## 2021-02-20 ENCOUNTER — Encounter: Payer: Self-pay | Admitting: Family Medicine

## 2021-02-20 VITALS — BP 128/76 | HR 79 | Temp 97.6°F | Ht 68.0 in | Wt 191.0 lb

## 2021-02-20 DIAGNOSIS — M720 Palmar fascial fibromatosis [Dupuytren]: Secondary | ICD-10-CM | POA: Diagnosis not present

## 2021-02-20 DIAGNOSIS — G250 Essential tremor: Secondary | ICD-10-CM

## 2021-02-20 DIAGNOSIS — G8929 Other chronic pain: Secondary | ICD-10-CM | POA: Diagnosis not present

## 2021-02-20 DIAGNOSIS — G720 Drug-induced myopathy: Secondary | ICD-10-CM | POA: Diagnosis not present

## 2021-02-20 DIAGNOSIS — R600 Localized edema: Secondary | ICD-10-CM | POA: Diagnosis not present

## 2021-02-20 DIAGNOSIS — M549 Dorsalgia, unspecified: Secondary | ICD-10-CM

## 2021-02-20 DIAGNOSIS — R269 Unspecified abnormalities of gait and mobility: Secondary | ICD-10-CM

## 2021-02-20 LAB — COMPREHENSIVE METABOLIC PANEL
ALT: 17 U/L (ref 0–53)
AST: 15 U/L (ref 0–37)
Albumin: 4.3 g/dL (ref 3.5–5.2)
Alkaline Phosphatase: 60 U/L (ref 39–117)
BUN: 18 mg/dL (ref 6–23)
CO2: 29 mEq/L (ref 19–32)
Calcium: 9.1 mg/dL (ref 8.4–10.5)
Chloride: 100 mEq/L (ref 96–112)
Creatinine, Ser: 1.51 mg/dL — ABNORMAL HIGH (ref 0.40–1.50)
GFR: 45.31 mL/min — ABNORMAL LOW (ref >60.00)
Glucose, Bld: 196 mg/dL — ABNORMAL HIGH (ref 70–99)
Potassium: 4.9 mEq/L (ref 3.5–5.1)
Sodium: 135 mEq/L (ref 135–145)
Total Bilirubin: 0.7 mg/dL (ref 0.2–1.2)
Total Protein: 7 g/dL (ref 6.0–8.3)

## 2021-02-20 LAB — CBC WITH DIFFERENTIAL/PLATELET
Basophils Absolute: 0 10*3/uL (ref 0.0–0.1)
Basophils Relative: 0.8 % (ref 0.0–3.0)
Eosinophils Absolute: 0.1 10*3/uL (ref 0.0–0.7)
Eosinophils Relative: 1.9 % (ref 0.0–5.0)
HCT: 41.7 % (ref 39.0–52.0)
Hemoglobin: 13.6 g/dL (ref 13.0–17.0)
Lymphocytes Relative: 20.6 % (ref 12.0–46.0)
Lymphs Abs: 1.2 10*3/uL (ref 0.7–4.0)
MCHC: 32.6 g/dL (ref 30.0–36.0)
MCV: 92.6 fl (ref 78.0–100.0)
Monocytes Absolute: 0.6 10*3/uL (ref 0.1–1.0)
Monocytes Relative: 10.3 % (ref 3.0–12.0)
Neutro Abs: 3.8 10*3/uL (ref 1.4–7.7)
Neutrophils Relative %: 66.4 % (ref 43.0–77.0)
Platelets: 231 10*3/uL (ref 150.0–400.0)
RBC: 4.51 Mil/uL (ref 4.22–5.81)
RDW: 13.8 % (ref 11.5–15.5)
WBC: 5.8 10*3/uL (ref 4.0–10.5)

## 2021-02-20 MED ORDER — OXYCODONE-ACETAMINOPHEN 5-325 MG PO TABS: ORAL_TABLET | ORAL | 0 refills | Status: DC

## 2021-02-20 NOTE — Telephone Encounter (Signed)
Patient has appt this morning in office. Patient can schedule f/u appt when checking out if needed.

## 2021-02-20 NOTE — Patient Instructions (Signed)
Take care.  Glad to see you. Go to the lab on the way out.   If you have mychart we'll likely use that to update you.    Don't change your meds for now.  Let me see about options when I get your labs.

## 2021-02-20 NOTE — Addendum Note (Signed)
Addended by: [name] on: 02/20/2021 09:16 AM   Modules accepted: Orders

## 2021-02-20 NOTE — Progress Notes (Signed)
This visit occurred during the SARS-CoV-2 public health emergency.  Safety protocols were in place, including screening questions prior to the visit, additional usage of staff PPE, and extensive cleaning of exam room while observing appropriate contact time as indicated for disinfecting solutions.  Foot swelling. No new meds.  Bilateral foot edema in the last few days.  Gradually worse.  No fevers, chills.  Not SOB.  No CP  no extra salt in diet.  No abd pain.  Swelling doesn't resolve over night.  Weight minimally increased.  Not SOB when supine.    Indication for chronic opioid:  Chronic neck and back pain Medication and dose: oxycodone 5/325mg  # pills per month: #180 per month Last UDS date: 03/14/16 Pain contract signed (Y/N):  yes Date narcotic database last reviewed (include red flags): 02/20/21   No recent falls. He is no longer walking the dog.  Using a cane at baseline.  Not drowsy from pain medicine. No constipation.  He is still in pain but is it more mangeable with meds.     Statin intolerant.  DM2 per endo.    Still on primidone for tremor at baseline.    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  If we wants intervention, then he can update me.  Anatomy and expectations d/w pt.    Meds, vitals, and allergies reviewed.   ROS: Per HPI unless specifically indicated in ROS section   GEN: nad, alert and oriented HEENT:ncat NECK: supple w/o LA CV: rrr. PULM: ctab, no inc wob ABD: soft, +bs EXT: 1+ BLE edema SKIN: no acute rash Tremor at baseline.  L hand palmar partial contracture noted.

## 2021-02-20 NOTE — Telephone Encounter (Signed)
Oxycodone filled at pharmacy 01/29/2021 per EMR.   Rx sent for oxycodone.   Needs pain med f/u later this summer.  Please schedule when possible.  Thanks.

## 2021-02-21 ENCOUNTER — Other Ambulatory Visit: Payer: Self-pay | Admitting: Family Medicine

## 2021-02-21 DIAGNOSIS — M720 Palmar fascial fibromatosis [Dupuytren]: Secondary | ICD-10-CM | POA: Insufficient documentation

## 2021-02-21 DIAGNOSIS — R7989 Other specified abnormal findings of blood chemistry: Secondary | ICD-10-CM

## 2021-02-21 DIAGNOSIS — R600 Localized edema: Secondary | ICD-10-CM | POA: Insufficient documentation

## 2021-02-21 NOTE — Assessment & Plan Note (Signed)
Not sedated and with some improvement of pain with oxycodone.  His pain is more manageable as is.  Not constipated.  Routine cautions given to patient.  He agrees with plan.  Recheck periodically.

## 2021-02-21 NOTE — Assessment & Plan Note (Signed)
He did not want intervention at this point.  He can update me as needed.

## 2021-02-21 NOTE — Assessment & Plan Note (Signed)
Unclear source.  Not short of breath.  Okay for outpatient follow-up.  No extra salt in diet.  Would check routine labs today.  See notes on labs.

## 2021-02-21 NOTE — Assessment & Plan Note (Signed)
Statin intolerant 

## 2021-02-21 NOTE — Assessment & Plan Note (Signed)
Fall cautions discussed with patient

## 2021-02-21 NOTE — Assessment & Plan Note (Signed)
Continue primidone 

## 2021-02-28 ENCOUNTER — Telehealth: Payer: Self-pay

## 2021-02-28 NOTE — Chronic Care Management (AMB) (Addendum)
Chronic Care Management Pharmacy Assistant   Name: Walter Harris  MRN: [id number] DOB: 02/03/1946   Reason for Encounter: Reminder Call    Recent office visits:  02/21/21 - PCP - Patient call, Hold Invokana x 10 days to see if swelling improves 02/20/21 - PCP - Labs ordered no medication changes  Recent consult visits:  None since last CCM contact  Hospital visits:  None in previous 6 months  Medications: Outpatient Encounter Medications as of 02/28/2021  Medication Sig   ALPRAZolam (XANAX) 0.5 MG tablet TAKE ONE (1) TABLET BY MOUTH TWO TIMES PER DAY WITH AN EXTRA 1/2 TABLET IF NEEDED. SEDATION CAUTION   aspirin 81 MG EC tablet Take 1 tablet (81 mg total) by mouth daily. For stroke prevention   BD PEN NEEDLE NANO U/F 32G X 4 MM MISC USE AS DIRECTED   canagliflozin (INVOKANA) 300 MG TABS tablet Take 1 tablet (300 mg total) by mouth daily before breakfast. For diabetes   clopidogrel (PLAVIX) 75 MG tablet TAKE 1 TABLET BY MOUTH DAILY   FLUoxetine (PROZAC) 10 MG capsule Take 3 capsules (30 mg total) by mouth daily. For mood.   glipiZIDE (GLUCOTROL XL) 10 MG 24 hr tablet Take 1 tablet (10 mg total) by mouth daily with breakfast. For diabetes   insulin glargine (LANTUS SOLOSTAR) 100 UNIT/ML Solostar Pen Inject 21 Units into the skin daily. For diabetes   irbesartan (AVAPRO) 150 MG tablet Take 1 tablet (150 mg total) by mouth daily. For blood pressure   ketoconazole (NIZORAL) 2 % cream Apply 1 application topically daily.   metFORMIN (GLUCOPHAGE-XR) 500 MG 24 hr tablet TAKE ONE TABLET 3 TIMES DAILY for diabetes   oxyCODONE-acetaminophen (PERCOCET/ROXICET) 5-325 MG tablet TAKE ONE TO TWO TABLETS BY MOUTH 3 TIMES DAILY IF NEEDED FOR PAIN. Fill on/after 04/29/2021   oxyCODONE-acetaminophen (PERCOCET/ROXICET) 5-325 MG tablet TAKE ONE TO TWO TABLETS BY MOUTH 3 TIMES DAILY IF NEEDED FOR PAIN. Fill on/after 03/30/2021   oxyCODONE-acetaminophen (PERCOCET/ROXICET) 5-325 MG tablet TAKE ONE TO TWO  TABLETS BY MOUTH 3 TIMES DAILY IF NEEDED FOR PAIN. Fill on/after 02/28/2021   primidone (MYSOLINE) 50 MG tablet Take 1 tablet (50 mg total) by mouth daily. For tremor   propranolol ER (INDERAL LA) 60 MG 24 hr capsule TAKE 1 CAPSULE EVERY DAY   tiZANidine (ZANAFLEX) 4 MG tablet TAKE ONE TABLET 3 TIMES DAILY AS NEEDED FOR MUSCLE SPASM MAY TAKE 1 EXTRADOSE DAILY AS NEEDED   traZODone (DESYREL) 50 MG tablet Take 1-2 tablets (50-100 mg total) by mouth at bedtime as needed for sleep.   No facility-administered encounter medications on file as of 02/28/2021.   Donshay L Larock was contacted to remind him of his upcoming telephone visit with Debbora Dus on 03/06/21 at 3:00pm .  he was reminded to have all medications, supplements and any blood glucose and blood pressure readings available for review at appointment.   Are you having any problems with your medications? No, reports Dr.Duncan took the patient off Invokana due to swelling in bilateral feet and swelling has improved  What concerns would you like to discuss with the pharmacist? None at this time  Star Rating Drugs: Medication:  Last Fill: Day Supply Glipizide 10mg             01/23/21            90 Irbesartan 150mg         02/16/21           90 Metformin 500mg   01/10/21             90 Invokana 300mg          02/21/21            30 Lantus 100 unit/ml 02/10/21  30  PCP appointment on 03/05/21 lab work at PCP  and CCM appointment on 03/06/21   Debbora Dus, CPP notified  Avel Sensor, [place] Assistant [phone]  I have reviewed the care management and care coordination activities outlined in this encounter and I am certifying that I agree with the content of this note. No further action required.  Debbora Dus, PharmD Clinical Pharmacist [facility] at [facility] [phone]

## 2021-03-05 ENCOUNTER — Other Ambulatory Visit: Payer: Self-pay

## 2021-03-05 ENCOUNTER — Other Ambulatory Visit (INDEPENDENT_AMBULATORY_CARE_PROVIDER_SITE_OTHER): Payer: PPO

## 2021-03-05 DIAGNOSIS — R7989 Other specified abnormal findings of blood chemistry: Secondary | ICD-10-CM

## 2021-03-05 LAB — BASIC METABOLIC PANEL
BUN: 24 mg/dL — ABNORMAL HIGH (ref 6–23)
CO2: 24 mEq/L (ref 19–32)
Calcium: 9.1 mg/dL (ref 8.4–10.5)
Chloride: 100 mEq/L (ref 96–112)
Creatinine, Ser: 1.52 mg/dL — ABNORMAL HIGH (ref 0.40–1.50)
GFR: 44.94 mL/min — ABNORMAL LOW (ref >60.00)
Glucose, Bld: 244 mg/dL — ABNORMAL HIGH (ref 70–99)
Potassium: 4.4 mEq/L (ref 3.5–5.1)
Sodium: 136 mEq/L (ref 135–145)

## 2021-03-06 ENCOUNTER — Telehealth: Payer: Self-pay

## 2021-03-06 ENCOUNTER — Ambulatory Visit (INDEPENDENT_AMBULATORY_CARE_PROVIDER_SITE_OTHER): Payer: PPO

## 2021-03-06 ENCOUNTER — Other Ambulatory Visit: Payer: Self-pay | Admitting: Family Medicine

## 2021-03-06 DIAGNOSIS — IMO0002 Reserved for concepts with insufficient information to code with codable children: Secondary | ICD-10-CM

## 2021-03-06 DIAGNOSIS — E1142 Type 2 diabetes mellitus with diabetic polyneuropathy: Secondary | ICD-10-CM | POA: Diagnosis not present

## 2021-03-06 DIAGNOSIS — E1165 Type 2 diabetes mellitus with hyperglycemia: Secondary | ICD-10-CM | POA: Diagnosis not present

## 2021-03-06 DIAGNOSIS — I10 Essential (primary) hypertension: Secondary | ICD-10-CM

## 2021-03-06 DIAGNOSIS — R269 Unspecified abnormalities of gait and mobility: Secondary | ICD-10-CM

## 2021-03-06 NOTE — Telephone Encounter (Signed)
Patient reports he was told to hold Invokana 10 days to see if swelling improved. Swelling has improved. Fasting BG have increased some, now in 140s range. No hypoglycemia. Continues Lantus 24 units, metformin  500 mg TID, and glipizide 10 mg with breakfast. He would like to know if he should remain off Invokana.   He also wanted to let you know he has fell 2-3 times recently due to losing his balance. Denies any injuries. Denies hypoglycemia, hypotension, or dizziness. He uses a walker and cane. Was using a cane outside when he fell yesterday. He does have some bruises/scrapes.   I'll be glad to call him back with any updates. Thanks.  Debbora Dus, PharmD Clinical Pharmacist [facility] at [facility] [phone]

## 2021-03-06 NOTE — Progress Notes (Signed)
Chronic Care Management Pharmacy Note  03/06/2021 Name:  Walter Harris MRN:  [id number] DOB:  10-02-1945  Subjective: Walter Harris is an 75 y.o. year old male who is a primary patient of [name], Elveria Rising, MD.  The CCM team was consulted for assistance with disease management and care coordination needs.    Engaged with patient by telephone for follow up visit in response to provider referral for pharmacy case management and/or care coordination services.   Consent to Services:  The patient was given information about Chronic Care Management services, agreed to services, and gave verbal consent prior to initiation of services.  Please see initial visit note for detailed documentation.   Patient Care Team: [name], MD as PCP - General (Family Medicine) Kary Kos, MD as Consulting Physician (Neurosurgery) Dingeldein, Remo Lipps, MD as Referring Physician (Ophthalmology) Oneta Rack, MD as Referring Physician (Dermatology) Kary Kos, MD as Consulting Physician (Neurosurgery) Debbora Dus, [facility] as Pharmacist (Pharmacist)  Recent office visits:  02/21/21 - PCP - Patient call, Hold Invokana x 10 days to see if swelling improves 02/20/21 - PCP - Labs ordered no medication changes   Recent consult visits: None since last CCM contact   Hospital visits:  None in previous 6 months  Objective:  Lab Results  Component Value Date   CREATININE 1.52 (H) 03/05/2021   BUN 24 (H) 03/05/2021   GFR 44.94 (L) 03/05/2021   GFRNONAA >60 12/07/2019   GFRAA >60 12/07/2019   NA 136 03/05/2021   K 4.4 03/05/2021   CALCIUM 9.1 03/05/2021   CO2 24 03/05/2021   GLUCOSE 244 (H) 03/05/2021    Lab Results  Component Value Date/Time   HGBA1C 9.2 (H) 11/10/2020 09:34 AM   HGBA1C 9.0 03/30/2020 12:00 AM   HGBA1C 9.6 (H) 12/07/2019 04:29 AM   GFR 44.94 (L) 03/05/2021 09:17 AM   GFR 45.31 (L) 02/20/2021 11:33 AM   MICROALBUR 5.6 (H) 02/28/2015 09:19 AM   MICROALBUR 5.9 (H)  02/19/2010 03:44 PM    Last diabetic Eye exam:  Lab Results  Component Value Date/Time   HMDIABEYEEXA No Retinopathy 07/06/2020 12:00 AM    Last diabetic Foot exam:  05/2020 per endocrinology   Lab Results  Component Value Date   CHOL 199 11/10/2020   HDL 39.30 11/10/2020   LDLCALC 121 (H) 11/10/2020   LDLDIRECT 160.2 06/01/2012   TRIG 195.0 (H) 11/10/2020   CHOLHDL 5 11/10/2020    Hepatic Function Latest Ref Rng & Units 02/20/2021 11/10/2020 12/07/2019  Total Protein 6.0 - 8.3 g/dL 7.0 6.7 6.5  Albumin 3.5 - 5.2 g/dL 4.3 4.2 3.6  AST 0 - 37 U/L 15 18 19   ALT 0 - 53 U/L 17 19 20   Alk Phosphatase 39 - 117 U/L 60 58 58  Total Bilirubin 0.2 - 1.2 mg/dL 0.7 0.8 0.9  Bilirubin, Direct 0.0 - 0.3 mg/dL - - -    Lab Results  Component Value Date/Time   TSH 1.46 11/10/2020 09:34 AM   TSH 1.59 09/28/2018 10:39 AM    CBC Latest Ref Rng & Units 02/20/2021 11/10/2020 12/07/2019  WBC 4.0 - 10.5 K/uL 5.8 6.3 6.0  Hemoglobin 13.0 - 17.0 g/dL 13.6 13.8 14.8  Hematocrit 39.0 - 52.0 % 41.7 41.4 43.5  Platelets 150.0 - 400.0 K/uL 231.0 226.0 181    Lab Results  Component Value Date/Time   VD25OH 31 03/24/2009 09:37 PM    Clinical ASCVD: Yes  The ASCVD Risk score ([place].,  et al., 2013) failed to calculate for the following reasons:   The patient has a prior MI or stroke diagnosis    Depression screen [facility] 2/9 10/27/2020 10/01/2019 09/28/2018  Decreased Interest 0 0 0  Down, Depressed, Hopeless 0 0 0  PHQ - 2 Score 0 0 0  Altered sleeping 0 0 0  Tired, decreased energy 0 0 0  Change in appetite 0 0 0  Feeling bad or failure about yourself  0 0 0  Trouble concentrating 0 0 0  Moving slowly or fidgety/restless 0 0 0  Suicidal thoughts 0 0 0  PHQ-9 Score 0 0 0  Difficult doing work/chores Not difficult at all Not difficult at all Not difficult at all  Some recent data might be hidden    Social History   Tobacco Use  Smoking Status Former   Packs/day: 0.50   Years: 40.00   Pack  years: 20.00   Types: Pipe, Cigarettes   Quit date: 06/09/2016   Years since quitting: 4.7  Smokeless Tobacco Never   BP Readings from Last 3 Encounters:  02/20/21 128/76  11/10/20 122/68  08/08/20 140/88   Pulse Readings from Last 3 Encounters:  02/20/21 79  11/10/20 76  08/08/20 80   Wt Readings from Last 3 Encounters:  02/20/21 191 lb (86.6 kg)  11/10/20 188 lb (85.3 kg)  08/08/20 190 lb (86.2 kg)   BMI Readings from Last 3 Encounters:  02/20/21 29.04 kg/m  11/10/20 28.59 kg/m  08/08/20 28.89 kg/m    Assessment/Interventions: Review of patient past medical history, allergies, medications, health status, including review of consultants reports, laboratory and other test data, was performed as part of comprehensive evaluation and provision of chronic care management services.   SDOH:  (Social Determinants of Health) assessments and interventions performed: Yes SDOH Interventions    Flowsheet Row Most Recent Value  SDOH Interventions   Financial Strain Interventions Intervention Not Indicated      SDOH Screenings   Alcohol Screen: Low Risk    Last Alcohol Screening Score (AUDIT): 0  Depression (PHQ2-9): Low Risk    PHQ-2 Score: 0  Financial Resource Strain: Low Risk    Difficulty of Paying Living Expenses: Not hard at all  Food Insecurity: No Food Insecurity   Worried About Charity fundraiser in the Last Year: Never true   Ran Out of Food in the Last Year: Never true  Housing: Low Risk    Last Housing Risk Score: 0  Physical Activity: Inactive   Days of Exercise per Week: 0 days   Minutes of Exercise per Session: 0 min  Social Connections: Not on file  Stress: No Stress Concern Present   Feeling of Stress : Not at all  Tobacco Use: Medium Risk   Smoking Tobacco Use: Former   Smokeless Tobacco Use: Never  Transportation Needs: No Data processing manager (Medical): No   Lack of Transportation (Non-Medical): No    CCM Care  Plan  Allergies  Allergen Reactions   Bee Venom Anaphylaxis   Pravastatin Other (See Comments)    Severe myalgias   Ace Inhibitors     Cough   Actos [Pioglitazone] Other (See Comments)    Intolerant, not allergic.    Angiotensin Receptor Blockers Other (See Comments)    cramps   Aripiprazole Other (See Comments)    Unknown reaction many years ago   Crestor [Rosuvastatin Calcium] Other (See Comments)    Aches, even with twice weekly dosing  Invokana [Canagliflozin]     Edema   Metformin And Related Other (See Comments)    Able to tolerate XR formulation, not allergic.    Nsaids     Would avoid given creatinine elevation.   Olanzapine Other (See Comments)    Unknown reaction many years ago   Pneumovax 23 [Pneumococcal Vac Polyvalent] Other (See Comments)    aches   Primidone Other (See Comments)    Vertigo at 46m, able to tolerate 239m   Codeine Rash   Divalproex Sodium Other (See Comments)     tremor   Lithium Carbonate Other (See Comments)    Tremors    Sulfamethoxazole-Trimethoprim Other (See Comments)    Mild reaction per Dr. LeSilvio Patept does not remember the reaction    Medications Reviewed Today     Reviewed by AdDebbora DusRPCenterstone Of FloridaPharmacist) on 03/06/21 at 1513  Med List Status: <None>   Medication Order Taking? Sig Documenting Provider Last Dose Status Informant  ALPRAZolam (XANAX) 0.5 MG tablet [id number] TAKE ONE (1) TABLET BY MOUTH TWO TIMES PER DAY WITH AN EXTRA 1/2 TABLET IF NEEDED. SEDATION CAUTION DuTonia GhentMD Taking Active   aspirin 81 MG EC tablet [id number] Take 1 tablet (81 mg total) by mouth daily. For stroke prevention DuTonia GhentMD Taking Active   BD PEN NEEDLE NANO U/F 32G X 4 MM MISC [id number] USE AS DIRECTED DuTonia GhentMD Taking Active   canagliflozin ([facility] MG TABS tablet [id number] Take 1 tablet (300 mg total) by mouth daily before breakfast. For diabetes  Patient not taking: Reported on 03/06/2021    DuTonia GhentMD Not Taking Active   clopidogrel (PLAVIX) 75 MG tablet [id number] TAKE 1 TABLET BY MOUTH DAILY DuTonia GhentMD Taking Active   FLUoxetine (PROZAC) 10 MG capsule [id number] Take 3 capsules (30 mg total) by mouth daily. For mood. DuTonia GhentMD Taking Active   glipiZIDE (GLUCOTROL XL) 10 MG 24 hr tablet [id number] Take 1 tablet (10 mg total) by mouth daily with breakfast. For diabetes DuTonia GhentMD Taking Active   insulin glargine (LANTUS SOLOSTAR) 100 UNIT/ML Solostar Pen [id number] Inject 21 Units into the skin daily. For diabetes  Patient taking differently: Inject 24 Units into the skin daily. For diabetes   DuTonia GhentMD Taking Active Self  irbesartan (AVAPRO) 150 MG tablet [id number] Take 1 tablet (150 mg total) by mouth daily. For blood pressure DuTonia GhentMD Taking Active   ketoconazole (NIZORAL) 2 % cream [id number] Apply 1 application topically daily. DuTonia GhentMD Taking Active   metFORMIN (GLUCOPHAGE-XR) 500 MG 24 hr tablet [id number] TAKE ONE TABLET 3 TIMES DAILY for diabetes DuTonia GhentMD Taking Active   oxyCODONE-acetaminophen (PERCOCET/ROXICET) 5-325 MG tablet [id number] TAKE ONE TO TWO TABLETS BY MOUTH 3 TIMES DAILY IF NEEDED FOR PAIN. Fill on/after 04/29/2021 DuTonia GhentMD Taking Active   oxyCODONE-acetaminophen (PERCOCET/ROXICET) 5-325 MG tablet [id number] TAKE ONE TO TWO TABLETS BY MOUTH 3 TIMES DAILY IF NEEDED FOR PAIN. Fill on/after 03/30/2021 DuTonia GhentMD Taking Active   oxyCODONE-acetaminophen (PERCOCET/ROXICET) 5-325 MG tablet [id number] TAKE ONE TO TWO TABLETS BY MOUTH 3 TIMES DAILY IF NEEDED FOR PAIN. Fill on/after 02/28/2021 DuTonia GhentMD Taking Active   primidone (MYSOLINE) 50 MG tablet [id number] Take 1 tablet (50 mg total) by mouth daily. For tremor DuDamita Dunnings  Elveria Rising, MD Taking Active   propranolol ER (INDERAL LA) 60 MG 24 hr capsule [id number] Yes TAKE  1 CAPSULE EVERY DAY [name], MD Taking Active   tiZANidine (ZANAFLEX) 4 MG tablet [id number] Yes TAKE ONE TABLET 3 TIMES DAILY AS NEEDED FOR MUSCLE SPASM MAY TAKE 1 EXTRADOSE DAILY AS NEEDED [name], MD Taking Active   traZODone (DESYREL) 50 MG tablet [id number] Yes Take 1-2 tablets (50-100 mg total) by mouth at bedtime as needed for sleep. [name], MD Taking Active             Patient Active Problem List   Diagnosis Date Noted   Dupuytren contracture 02/21/2021   Lower extremity edema 02/21/2021   Left shoulder pain 11/11/2020   Drug-induced myopathy 09/10/2020   Fall at home 08/09/2020   History of CVA in adulthood 12/07/2019   Gait abnormality 11/12/2019   Tremor, essential 11/12/2019   Health care maintenance 10/04/2018   Thumb pain 10/04/2018   Memory change 12/17/2017   Paresthesia 12/03/2017   Lightheaded 08/28/2017   Encounter for chronic pain management 06/18/2017   Neck pain 05/14/2017   Panic 01/10/2017   Muscle ache 10/05/2015   Syncope 07/08/2015   Chronic back pain 04/12/2015   Medicare annual wellness visit, initial 03/08/2015   Advance care planning 03/08/2015   Dysgeusia 11/28/2014   Anxiety state 10/27/2014   Diabetic polyneuropathy ([place]) 02/14/2014   Rhinitis, chronic 01/13/2012   Basal cell carcinoma    LUMBAR RADICULOPATHY 10/16/2010   HLD (hyperlipidemia) 05/13/2008   Uncontrolled type 2 diabetes mellitus with peripheral neuropathy ([place]) 05/26/2007   Essential hypertension 05/26/2007   SCOLIOSIS 05/26/2007    Immunization History  Administered Date(s) Administered   Influenza Split 07/10/2011, 05/28/2012   Influenza Whole 06/08/2008, 05/29/2009, 05/22/2010   Influenza, High Dose Seasonal PF 06/09/2018, 05/30/2020   Influenza,inj,Quad PF,6+ Mos 06/13/2017, 06/09/2018, 05/04/2019   Influenza-Unspecified 06/30/2015, 07/12/2016, 06/13/2017, 06/09/2018, 05/04/2019   Moderna Sars-Covid-2 Vaccination 07/21/2020    PFIZER(Purple Top)SARS-COV-2 Vaccination 11/04/2019, 12/02/2019   Pneumococcal Conjugate-13 09/09/2014   Pneumococcal Polysaccharide-23 02/19/2010, 01/03/2016   Td 11/24/2006   Tdap 12/13/2016   Zoster Recombinat (Shingrix) 04/22/2020, 06/27/2020   Zoster, Live 12/22/2009    Conditions to be addressed/monitored:  Hypertension and Diabetes  Care Plan : [place]  Updates made by Debbora Dus, [place] since 03/08/2021 12:00 AM     Problem: CHL AMB "PATIENT-SPECIFIC PROBLEM"      Long-Range Goal: Disease Management   Start Date: 03/06/2021  Priority: High  Note:    Current Barriers:  Frequent falls   Pharmacist Clinical Goal(s):  Patient will contact provider office for questions/concerns as evidenced notation of same in electronic health record through collaboration with PharmD and provider.   Interventions: 1:1 collaboration with [name], MD regarding development and update of comprehensive plan of care as evidenced by provider attestation and co-signature Inter-disciplinary care team collaboration (see longitudinal plan of care) Comprehensive medication review performed; medication list updated in electronic medical record  Hypertension (BP goal <140/90) -Controlled -Current treatment: Irbesartan 150 mg - 1 tablet daily -Medications previously tried: amlodipine - stopped due to orthostatic hypotension -Current home readings: none reported -Denies hypotensive/hypertensive symptoms -He reports multiple falls recently due to imbalance. Using walker and cane. Using cane when he fell. Denies any vision impairment. Denies any low BP readings. Denies daytime drowsiness or side effects from Xanax BID. -Educated on BP goals and benefits of medications for prevention of heart attack, stroke  and kidney damage; -Counseled to monitor BP at home with symptoms of dizziness document, and provide log at future appointments -Recommended to continue current medication;  Consult PCP about falls/PT.  Diabetes (A1c goal <7%) -Not ideally controlled - Fasting BG are slightly above goal of 80-130 Followed by endocrinology, reports next visit is 06/06/21 -Pt had held [place] for the past 10 days, would like to know if he should stay off. His swelling has improved off Invokana. -Current medications: Glipizide 10 mg XL - 1 tablet daily Lantus Solostar - 21 units daily (Pt taking differently: 24 units daily) Metformin 500 mg - 1 tablet three times daily  -Medications previously tried: glimepiride, Invokana  -Current home glucose readings fasting glucose: 140s (reports up from 110-120s when on 1/2 tab Invokana daily, this is on hold due to sweling) post prandial glucose: none -Denies hypoglycemic/hyperglycemic symptoms -Educated on Prevention and management of hypoglycemic episodes; -Counseled to check feet daily and get yearly eye exams -up to date  -Recommended to continue current medication; Per PCP, stay off Invokana for now. We will check in for BG log next month.  Patient Goals/Self-Care Activities Patient will:  - check blood pressure with any dizziness, document, and provide at future appointments -contact office with any health concerns  Follow Up Plan: Telephone follow up appointment with care management team member scheduled for:  6 months      Medication Assistance: None required.  Patient affirms current coverage meets needs.  Compliance/Adherence/Medication fill history: Care Gaps: COVID-19 Booster  Star-Rating Drugs: Medication:                Last Fill:         Day Supply Glipizide 66m            01/23/21            90 Irbesartan 1550m       02/16/21           90 Metformin 50090m      01/10/21             90 Invokana 300m11m      02/21/21            30 Lantus 100 unit/ml      02/10/21              30  No gaps in adherence   [id number]: [phone]: [phone] pill box? Yes Pt endorses 100% compliance  We discussed: Patient is doing well with current pharmacy Patient decided to: Continue current medication management strategy  Care Plan and Follow Up Patient Decision:  Patient agrees to Care Plan and Follow-up.  MichDebbora DusarmD Clinical Pharmacist [facility] [place] [facility]-[phone]

## 2021-03-07 ENCOUNTER — Encounter: Payer: Self-pay | Admitting: Family Medicine

## 2021-03-07 ENCOUNTER — Other Ambulatory Visit: Payer: Self-pay | Admitting: Family Medicine

## 2021-03-07 DIAGNOSIS — R7989 Other specified abnormal findings of blood chemistry: Secondary | ICD-10-CM

## 2021-03-07 NOTE — Telephone Encounter (Signed)
I would stay off Invokana for now.  I would continue his other meds as he has been taking them.  Updated his med and allergy list.  If his falls are attributable to a treatable issue (low sugar, low blood pressure, etc.) then we should try to address the underlying issue.  In the absence of that the only thing I can think of to do is to send him to physical therapy.  Please let me know if he is interested in that.  Thanks.

## 2021-03-08 ENCOUNTER — Telehealth: Payer: Self-pay

## 2021-03-08 DIAGNOSIS — T466X5A Adverse effect of antihyperlipidemic and antiarteriosclerotic drugs, initial encounter: Secondary | ICD-10-CM

## 2021-03-08 NOTE — Progress Notes (Addendum)
Spoke with the patient and he will not use Invokana until further notice and also he is interested in doing Physical therapy for his falls and to let them know if he can do this at [facility] this is within a mile of his home as he does not drive but will get someone to take him.  Debbora Dus, CPP notified  Avel Sensor, [place] Assistant [phone]

## 2021-03-08 NOTE — Telephone Encounter (Signed)
Walter Harris, please let patient know he can stay off Invokana until further notice. Dr. [name] would also like to know if Walter Harris would be interested in physical therapy for his balance/frequent falls.  Debbora Dus, PharmD Clinical Pharmacist [facility] at [facility] [phone]

## 2021-03-08 NOTE — Telephone Encounter (Signed)
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 Patient is failing statin measure at this time, but per chart he is unable to tolerate statins.   Debbora Dus, PharmD Clinical Pharmacist [facility] at [facility] [phone]

## 2021-03-08 NOTE — Patient Instructions (Signed)
Dear Walter Harris,  Below is a summary of the goals we discussed during our follow up appointment on March 06, 2021. Please contact me anytime with questions or concerns.  Visit Information  Patient Care Plan: CCM Pharmacy Care Plan     Problem Identified: CHL AMB "PATIENT-SPECIFIC PROBLEM"      Long-Range Goal: Disease Management   Start Date: 03/06/2021  Priority: High  Note:    Current Barriers:  Frequent falls   Pharmacist Clinical Goal(s):  Patient will contact provider office for questions/concerns as evidenced notation of same in electronic health record through collaboration with PharmD and provider.   Interventions: 1:1 collaboration with [name], MD regarding development and update of comprehensive plan of care as evidenced by provider attestation and co-signature Inter-disciplinary care team collaboration (see longitudinal plan of care) Comprehensive medication review performed; medication list updated in electronic medical record  Hypertension (BP goal <140/90) -Controlled -Current treatment: Irbesartan 150 mg - 1 tablet daily -Medications previously tried: amlodipine - stopped due to orthostatic hypotension -Current home readings: none reported -Denies hypotensive/hypertensive symptoms -He reports multiple falls recently due to imbalance. Using walker and cane. Using cane when he fell. Denies any vision impairment. Denies any low BP readings. Denies daytime drowsiness or side effects from Xanax BID. -Educated on BP goals and benefits of medications for prevention of heart attack, stroke and kidney damage; -Counseled to monitor BP at home with symptoms of dizziness document, and provide log at future appointments -Recommended to continue current medication; Consult PCP about falls/PT.  Diabetes (A1c goal <7%) -Not ideally controlled - Fasting BG are slightly above goal of 80-130 Followed by endocrinology, reports next visit is 06/06/21 -Pt had held  [place] for the past 10 days, would like to know if he should stay off. His swelling has improved off Invokana. -Current medications: Glipizide 10 mg XL - 1 tablet daily Lantus Solostar - 21 units daily (Pt taking differently: 24 units daily) Metformin 500 mg - 1 tablet three times daily  -Medications previously tried: glimepiride, Invokana  -Current home glucose readings fasting glucose: 140s (reports up from 110-120s when on 1/2 tab Invokana daily, this is on hold due to sweling) post prandial glucose: none -Denies hypoglycemic/hyperglycemic symptoms -Educated on Prevention and management of hypoglycemic episodes; -Counseled to check feet daily and get yearly eye exams -up to date  -Recommended to continue current medication; Per PCP, stay off Invokana for now. We will check in for BG log next month.  Patient Goals/Self-Care Activities Patient will:  - check blood pressure with any dizziness, document, and provide at future appointments -contact office with any health concerns  Follow Up Plan: Telephone follow up appointment with care management team member scheduled for:  6 months      Patient verbalizes understanding of instructions provided today and agrees to view in [place].   Debbora Dus, PharmD Clinical Pharmacist [facility] at [facility] [phone]

## 2021-03-09 NOTE — Assessment & Plan Note (Signed)
Unable to tolerate statin 

## 2021-03-09 NOTE — Telephone Encounter (Signed)
I put in the PT order.  Thanks.

## 2021-03-09 NOTE — Telephone Encounter (Signed)
Done. Thanks.

## 2021-03-09 NOTE — Addendum Note (Signed)
Addended by: [name] on: 03/09/2021 08:01 AM   Modules accepted: Orders

## 2021-04-05 ENCOUNTER — Telehealth: Payer: Self-pay | Admitting: Family Medicine

## 2021-04-05 NOTE — Telephone Encounter (Signed)
[name] called in wanted to know about getting his PT done [facility]  instead of going to [place] [place].

## 2021-04-06 NOTE — Telephone Encounter (Signed)
I will check with referrals about this.  Thanks.

## 2021-04-12 ENCOUNTER — Other Ambulatory Visit: Payer: Self-pay | Admitting: Family Medicine

## 2021-04-19 NOTE — Telephone Encounter (Signed)
Referral was corrected per patient request and sent to [facility]  Nothing further needed.

## 2021-05-18 IMAGING — CT CT HEAD W/O CM
3 series · 15 of 47 positions shown, 18 images · non-contrast
Comparison: MRI dated October 06, 2017.
COMPARISON: MRI dated October 06, 2017.

Addendum:
CLINICAL DATA: Altered mental status.

EXAM:
CT HEAD WITHOUT CONTRAST
TECHNIQUE: Contiguous axial images were obtained from the base of the skull
through the vertex without intravenous contrast.

[Series 2: head wo · axial · 0.47mm/px · z∈[-128,+12]mm · 9 of 34 slices shown, 12 images]
[im 3/34  brain]
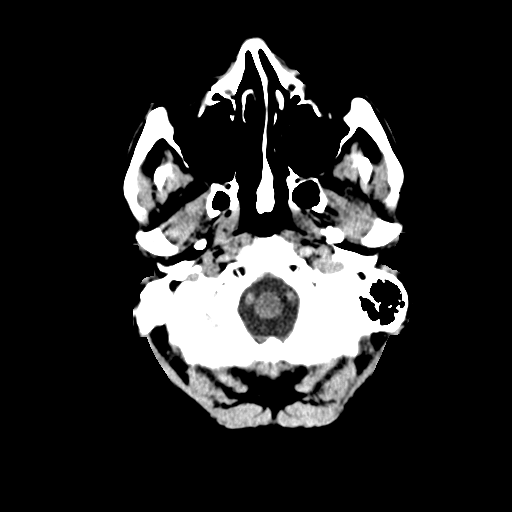
[im 3/34  bone]
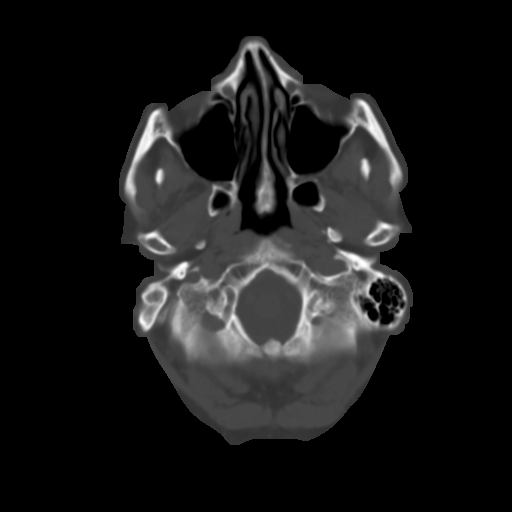
[im 6/34  brain]
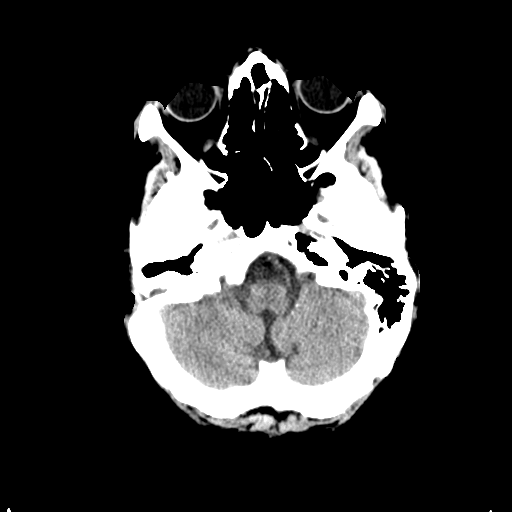
[im 10/34  brain]
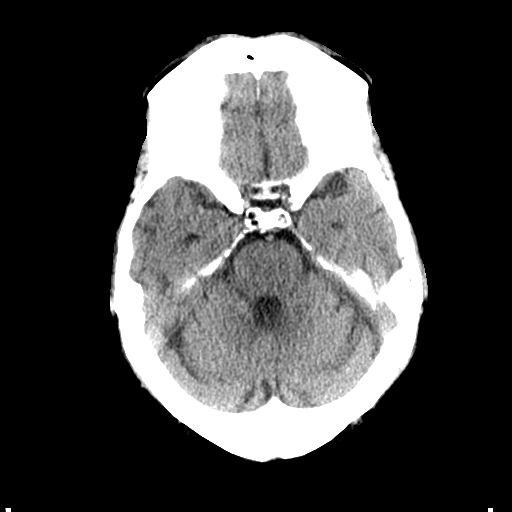
[im 13/34  brain]
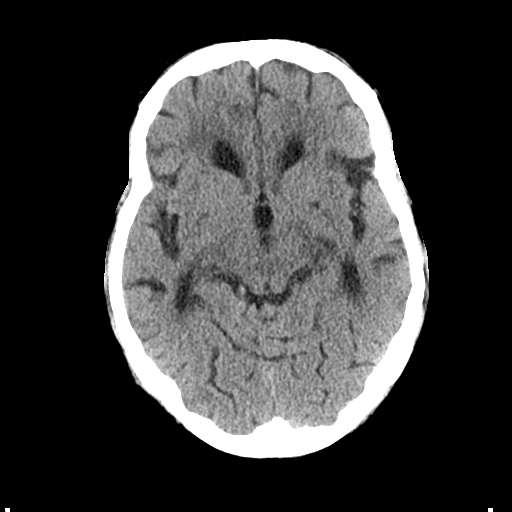
[im 18/34  brain]
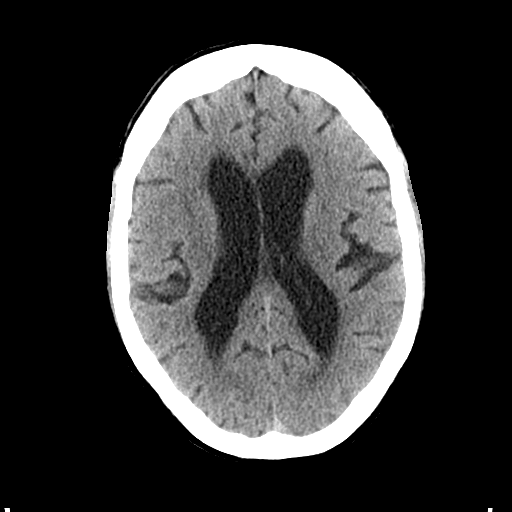
[im 18/34  bone]
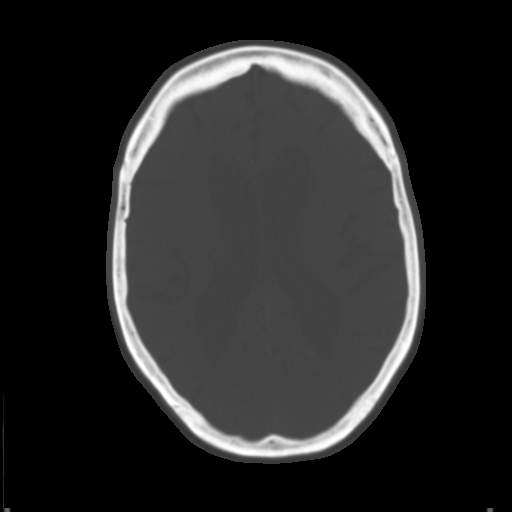
[im 21/34  brain]
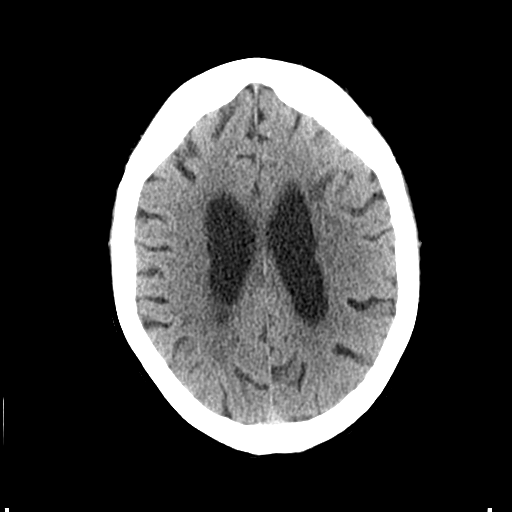
[im 24/34  brain]
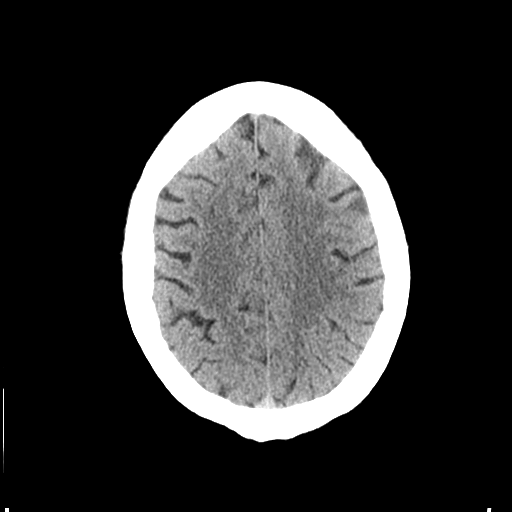
[im 28/34  brain]
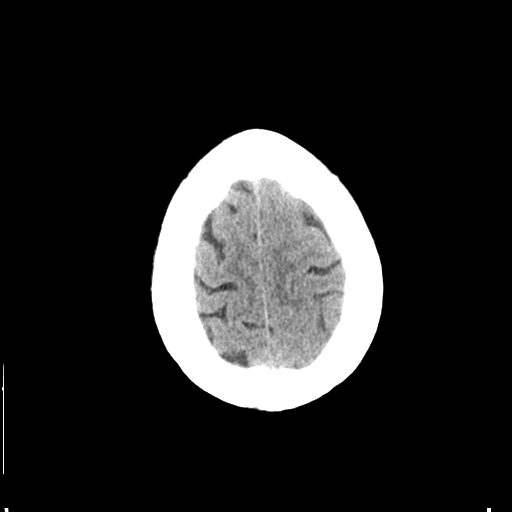
[im 31/34  brain]
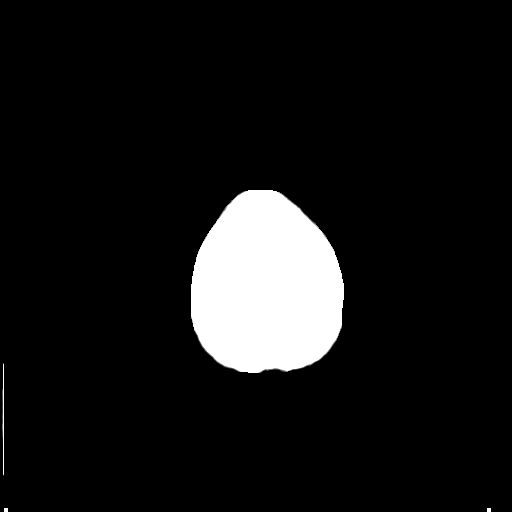
[im 31/34  bone]
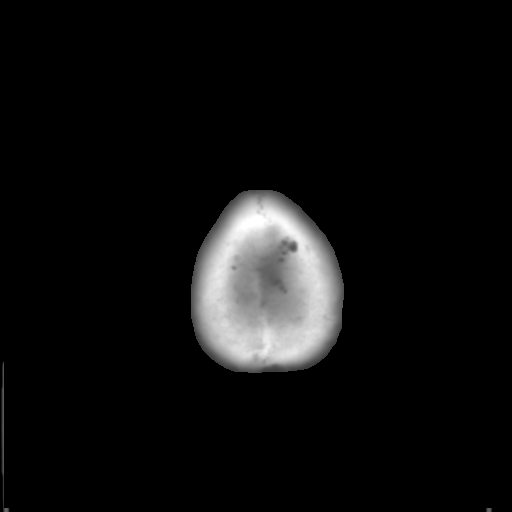

[Series 4: coronal soft tissue · coronal · 0.33mm/px · 3 of 67 slices shown]
[im 23/67  brain]
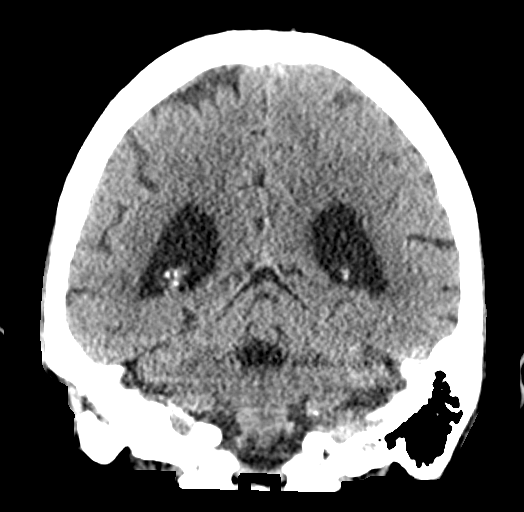
[im 30/67  brain]
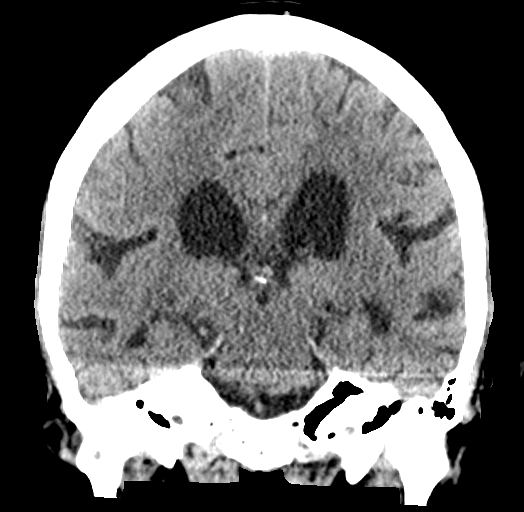
[im 37/67  brain]
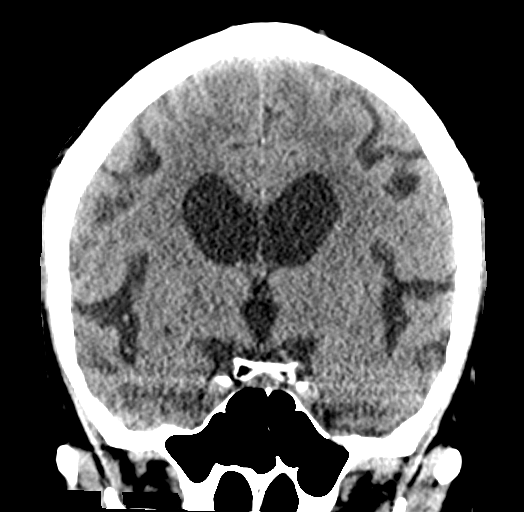

[Series 5: sagittal soft tissue · sagittal · 0.35mm/px · 3 of 52 slices shown]
[im 18/52  brain]
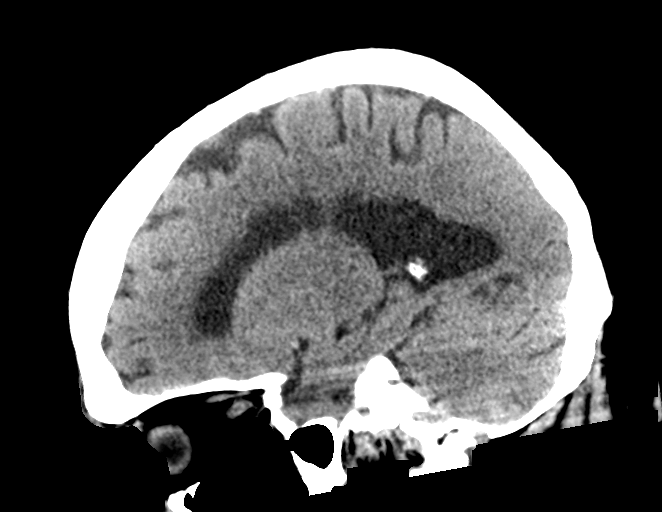
[im 26/52  brain]
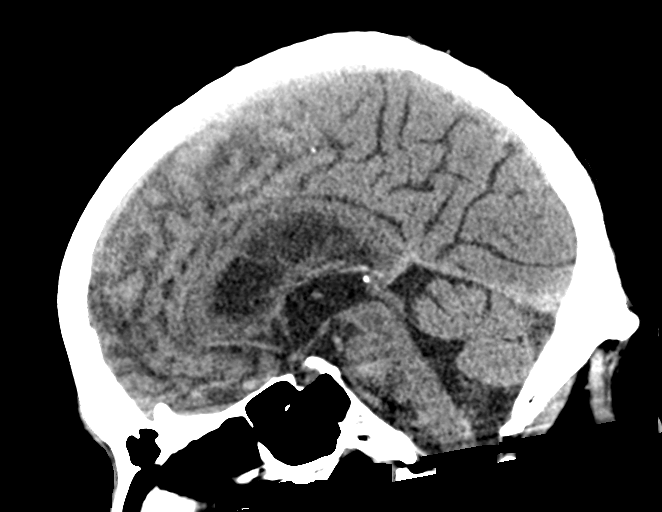
[im 35/52  brain]
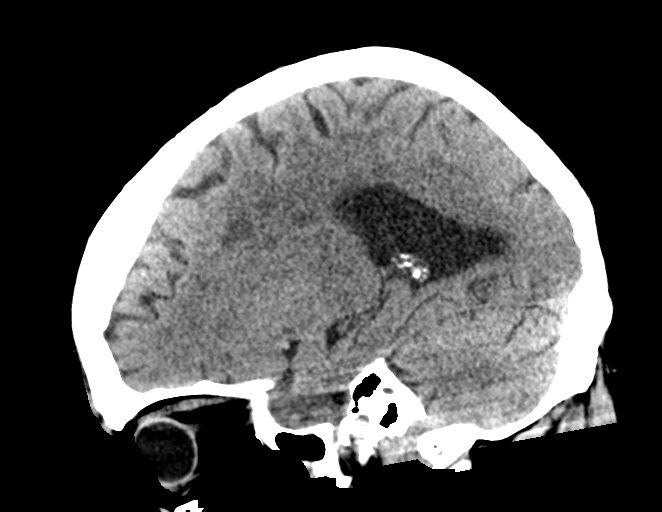

[15 of 47 positions shown; findings below may reference images not displayed]

FINDINGS: Brain: There is a hypoattenuating area in the left frontal lobe
measuring approximately 2.6 cm there is no acute intracranial
hemorrhage. Atrophy and chronic microvascular ischemic changes are
noted.

Vascular: No hyperdense vessel or unexpected calcification.

Skull: Normal. Negative for fracture or focal lesion.

Sinuses/Orbits: No acute finding.

Other: None.
IMPRESSION: 1. Findings concerning for an acute or subacute left MCA territory
infarct involving the left frontal lobe as detailed above.
2. No acute intracranial hemorrhage.
3. Atrophy and chronic microvascular ischemic changes are noted.

ADDENDUM:
These results were called by telephone at the time of interpretation
on 12/06/2019 at [DATE] to provider ROTIMI BERTSCH , who verbally
acknowledged these results.

*** End of Addendum ***
FINDINGS: Brain: There is a hypoattenuating area in the left frontal lobe
measuring approximately 2.6 cm there is no acute intracranial
hemorrhage. Atrophy and chronic microvascular ischemic changes are
noted.

Vascular: No hyperdense vessel or unexpected calcification.

Skull: Normal. Negative for fracture or focal lesion.

Sinuses/Orbits: No acute finding.

Other: None.
IMPRESSION: 1. Findings concerning for an acute or subacute left MCA territory
infarct involving the left frontal lobe as detailed above.
2. No acute intracranial hemorrhage.
3. Atrophy and chronic microvascular ischemic changes are noted.

## 2021-05-31 ENCOUNTER — Other Ambulatory Visit: Payer: Self-pay | Admitting: Family Medicine

## 2021-06-06 DIAGNOSIS — E785 Hyperlipidemia, unspecified: Secondary | ICD-10-CM | POA: Diagnosis not present

## 2021-06-06 DIAGNOSIS — Z794 Long term (current) use of insulin: Secondary | ICD-10-CM | POA: Diagnosis not present

## 2021-06-06 DIAGNOSIS — E1169 Type 2 diabetes mellitus with other specified complication: Secondary | ICD-10-CM | POA: Diagnosis not present

## 2021-06-06 DIAGNOSIS — E1165 Type 2 diabetes mellitus with hyperglycemia: Secondary | ICD-10-CM | POA: Diagnosis not present

## 2021-06-07 ENCOUNTER — Telehealth: Payer: Self-pay | Admitting: Family Medicine

## 2021-06-07 NOTE — Telephone Encounter (Signed)
  Encourage patient to contact the pharmacy for refills or they can request refills through [place]:  Please schedule appointment if longer than 1 year  NEXT APPOINTMENT DATE:10/29/21  MEDICATION:oxyCODONE-acetaminophen (PERCOCET/ROXICET) 5-325 MG tablet  Is the patient out of medication? No  [place], [place]  Let patient know to contact pharmacy at the end of the day to make sure medication is ready.  Please notify patient to allow 48-72 hours to process  CLINICAL FILLS OUT ALL BELOW:   LAST REFILL:  QTY:  REFILL DATE:    OTHER COMMENTS:    Okay for refill?  Please advise

## 2021-06-07 NOTE — Telephone Encounter (Signed)
Refill request for oxycodone  LOV - 03/06/21 Next OV - 11/12/21 Last refill - 02/20/21 #180/0

## 2021-06-08 ENCOUNTER — Other Ambulatory Visit: Payer: Self-pay | Admitting: Family Medicine

## 2021-06-08 MED ORDER — OXYCODONE-ACETAMINOPHEN 5-325 MG PO TABS: ORAL_TABLET | ORAL | 0 refills | Status: DC

## 2021-06-08 NOTE — Addendum Note (Signed)
Addended by: [name] on: 06/08/2021 03:35 PM   Modules accepted: Orders

## 2021-06-08 NOTE — Telephone Encounter (Signed)
Sent. Thanks.   

## 2021-06-11 NOTE — Telephone Encounter (Signed)
Refill request for alprazolam 0.5 mg tablets  LOV - 03/06/21 Next OV - 11/12/21 Last refill - 02/18/21 #75/2  Patient also needs primidone 50 mg tablets refilled.

## 2021-06-12 NOTE — Telephone Encounter (Signed)
Sent. Thanks.   

## 2021-06-22 ENCOUNTER — Telehealth: Payer: Self-pay

## 2021-06-22 NOTE — Progress Notes (Addendum)
Chronic Care Management Pharmacy Assistant   Name: Walter Harris  MRN: [id number] DOB: 1946/03/21  Reason for Encounter: Diabetes Disease State   Recent office visits:  None since last CCM contact.   Recent consult visits:  06/11/2021 - Endocrolongy - Patient presented for follow up diabetes. His A1c today 9.3. Previously, his A1c has not matched his blood sugars as shown by his fructosamine tests above. I will check a fructosamine and serum glucose today and make adjustments as necessary. He cannot take his MTF all at once due to GI issues. I encouraged lifestyle modifications. No medication changes.   Hospital visits:  None in previous 6 months  Medications: Outpatient Encounter Medications as of 06/22/2021  Medication Sig   ALPRAZolam (XANAX) 0.5 MG tablet TAKE ONE (1) TABLET BY MOUTH TWO TIMES PER DAY WITH AN EXTRA 1/2 TABLET IF NEEDED. SEDATION CAUTION   aspirin 81 MG EC tablet Take 1 tablet (81 mg total) by mouth daily. For stroke prevention   BD PEN NEEDLE NANO U/F 32G X 4 MM MISC USE AS DIRECTED   clopidogrel (PLAVIX) 75 MG tablet TAKE 1 TABLET BY MOUTH DAILY   FLUoxetine (PROZAC) 10 MG capsule Take 3 capsules (30 mg total) by mouth daily. For mood.   glipiZIDE (GLUCOTROL XL) 10 MG 24 hr tablet Take 1 tablet (10 mg total) by mouth daily with breakfast. For diabetes   insulin glargine (LANTUS SOLOSTAR) 100 UNIT/ML Solostar Pen Inject 21 Units into the skin daily. For diabetes (Patient taking differently: Inject 24 Units into the skin daily. For diabetes)   irbesartan (AVAPRO) 150 MG tablet Take 1 tablet (150 mg total) by mouth daily. For blood pressure   ketoconazole (NIZORAL) 2 % cream Apply 1 application topically daily.   metFORMIN (GLUCOPHAGE-XR) 500 MG 24 hr tablet TAKE ONE TABLET 3 TIMES DAILY for diabetes   oxyCODONE-acetaminophen (PERCOCET/ROXICET) 5-325 MG tablet TAKE ONE TO TWO TABLETS BY MOUTH 3 TIMES DAILY IF NEEDED FOR PAIN. Fill on/after 08/07/2021    oxyCODONE-acetaminophen (PERCOCET/ROXICET) 5-325 MG tablet TAKE ONE TO TWO TABLETS BY MOUTH 3 TIMES DAILY IF NEEDED FOR PAIN. Fill on/after 07/08/2021   oxyCODONE-acetaminophen (PERCOCET/ROXICET) 5-325 MG tablet TAKE ONE TO TWO TABLETS BY MOUTH 3 TIMES DAILY IF NEEDED FOR PAIN.   primidone (MYSOLINE) 50 MG tablet TAKE 1 TABLET BY MOUTH DAILY   propranolol ER (INDERAL LA) 60 MG 24 hr capsule TAKE 1 CAPSULE EVERY DAY   tiZANidine (ZANAFLEX) 4 MG tablet TAKE ONE TABLET 3 TIMES DAILY AS NEEDED FOR MUSCLE SPASM MAY TAKE 1 EXTRADOSE DAILY AS NEEDED   traZODone (DESYREL) 50 MG tablet TAKE 1 AND 1/2 TABLETS BY MOUTH AT BEDTIME AS NEEDED FOR SLEEP   No facility-administered encounter medications on file as of 06/22/2021.   Recent Relevant Labs: Lab Results  Component Value Date/Time   HGBA1C 9.2 (H) 11/10/2020 09:34 AM   HGBA1C 9.0 03/30/2020 12:00 AM   HGBA1C 9.6 (H) 12/07/2019 04:29 AM   MICROALBUR 5.6 (H) 02/28/2015 09:19 AM   MICROALBUR 5.9 (H) 02/19/2010 03:44 PM    Kidney Function Lab Results  Component Value Date/Time   CREATININE 1.52 (H) 03/05/2021 09:17 AM   CREATININE 1.51 (H) 02/20/2021 11:33 AM   CREATININE 1.1 09/29/2019 12:00 AM   CREATININE 1.20 (H) 02/13/2018 03:20 PM   CREATININE 1.01 09/13/2013 03:25 PM   GFR 44.94 (L) 03/05/2021 09:17 AM   GFRNONAA >60 12/07/2019 04:29 AM   GFRNONAA >60 09/13/2013 03:25 PM   GFRAA >60 12/07/2019  04:29 AM   GFRAA >60 09/13/2013 03:25 PM    Contacted patient on 06/25/2021 to discuss diabetes disease state.  Spoke with patient's wife.   Current antihyperglycemic regimen:  Glipizide 10 mg XL - 1 tablet daily  Lantus Solostar - 20 units - evening Metformin 500 mg - 1 tablet three times daily   Patient verbally confirms he is taking the above medications as directed. Yes  What diet changes have been made to improve diabetes control? Patient's wife states he does watch how much sugar he eats. After all these years he knows what he can eat  and not eat.   What recent interventions/DTPs have been made to improve glycemic control:  Recommended lifestyle changes.   Have there been any recent hospitalizations or ED visits since last visit with CPP? No  Patient denies hypoglycemic symptoms, including Pale, Sweaty, Shaky, Hungry, Nervous/irritable, and Vision changes  Patient denies hyperglycemic symptoms, including blurry vision, excessive thirst, fatigue, polyuria, and weakness  How often are you checking your blood sugar? once daily before eating  What are your blood sugars ranging?  10/10 - 117 10/11 - 120 10/12 - 115 10/13 - 123 10/14 - 120  During the week, how often does your blood glucose drop below 70? Never  Are you checking your feet daily/regularly? Yes  Adherence Review: Is the patient currently on a STATIN medication? No Is the patient currently on ACE/ARB medication? Yes Does the patient have >5 day gap between last estimated fill dates? No  Care Gaps: Annual wellness visit in last year? Yes Most recent A1C reading: 9.3 on 06/06/2021 Most Recent BP reading: 128/76 on 02/20/2021  Last eye exam / retinopathy screening: Up to date Last diabetic foot exam: 04/25/2020  Counseled patient on importance of annual eye and foot exam.   Star Rating Drugs:  Medication:  Last Fill: Day Supply Irbesartan 150mg  05/09/2021 90  Metformin 500mg  04/12/2021 90  Glipizide 10mg  04/25/2021 90   No appointments scheduled within the next 30 days.  Debbora Dus, CPP notified  Marijean Niemann, [place] Clinical Pharmacy Assistant [phone]  I have reviewed the care management and care coordination activities outlined in this encounter and I am certifying that I agree with the content of this note. His A1c inaccurate per endo. Fasting BG within goal. No further action required.  Debbora Dus, PharmD Clinical Pharmacist [facility] at [facility] [phone]

## 2021-07-11 DIAGNOSIS — E119 Type 2 diabetes mellitus without complications: Secondary | ICD-10-CM | POA: Diagnosis not present

## 2021-07-11 LAB — HM DIABETES EYE EXAM

## 2021-08-15 ENCOUNTER — Other Ambulatory Visit: Payer: Self-pay | Admitting: Family Medicine

## 2021-08-16 NOTE — Telephone Encounter (Signed)
Refill request for Oxycodone 5-325 mg tablet  LOV - 02/20/21 Next OV - 11/12/21 Last refill - 06/08/21 #180/0 x3

## 2021-08-17 MED ORDER — OXYCODONE-ACETAMINOPHEN 5-325 MG PO TABS: ORAL_TABLET | ORAL | 0 refills | Status: DC

## 2021-08-17 NOTE — Telephone Encounter (Signed)
Sent. Thanks.   

## 2021-08-27 ENCOUNTER — Other Ambulatory Visit: Payer: Self-pay | Admitting: Family Medicine

## 2021-08-28 ENCOUNTER — Telehealth: Payer: Self-pay

## 2021-08-28 NOTE — Chronic Care Management (AMB) (Addendum)
Chronic Care Management Pharmacy Assistant   Name: Walter Harris  MRN: [id number] DOB: 1946-08-09  Reason for Encounter: Hypertension Disease State  Recent office visits:  None since last CCM contact  Recent consult visits:  07/11/21-Ophthalmology-no data found  Hospital visits:  None in previous 6 months  Medications: Outpatient Encounter Medications as of 08/28/2021  Medication Sig   ALPRAZolam (XANAX) 0.5 MG tablet TAKE ONE (1) TABLET BY MOUTH TWO TIMES PER DAY WITH AN EXTRA 1/2 TABLET IF NEEDED. SEDATION CAUTION   aspirin 81 MG EC tablet Take 1 tablet (81 mg total) by mouth daily. For stroke prevention   BD PEN NEEDLE NANO U/F 32G X 4 MM MISC USE AS DIRECTED   clopidogrel (PLAVIX) 75 MG tablet TAKE 1 TABLET BY MOUTH DAILY   FLUoxetine (PROZAC) 10 MG capsule TAKE 3 CAPSULES BY MOUTH DAILY FOR MOOD AS DIRECTED   glipiZIDE (GLUCOTROL XL) 10 MG 24 hr tablet Take 1 tablet (10 mg total) by mouth daily with breakfast. For diabetes   insulin glargine (LANTUS SOLOSTAR) 100 UNIT/ML Solostar Pen Inject 21 Units into the skin daily. For diabetes (Patient taking differently: Inject 24 Units into the skin daily. For diabetes)   irbesartan (AVAPRO) 150 MG tablet Take 1 tablet (150 mg total) by mouth daily. For blood pressure   ketoconazole (NIZORAL) 2 % cream Apply 1 application topically daily.   metFORMIN (GLUCOPHAGE-XR) 500 MG 24 hr tablet TAKE ONE TABLET 3 TIMES DAILY for diabetes   oxyCODONE-acetaminophen (PERCOCET/ROXICET) 5-325 MG tablet TAKE 1-2 TABLETS BY MOUTH 3 TIMES DAILY AS NEEDED FOR PAIN.   oxyCODONE-acetaminophen (PERCOCET/ROXICET) 5-325 MG tablet TAKE ONE TO TWO TABLETS BY MOUTH 3 TIMES DAILY IF NEEDED FOR PAIN. Fill on/after 09/16/2021   oxyCODONE-acetaminophen (PERCOCET/ROXICET) 5-325 MG tablet TAKE ONE TO TWO TABLETS BY MOUTH 3 TIMES DAILY IF NEEDED FOR PAIN.   primidone (MYSOLINE) 50 MG tablet TAKE 1 TABLET BY MOUTH DAILY   propranolol ER (INDERAL LA) 60 MG 24 hr capsule  TAKE 1 CAPSULE EVERY DAY   tiZANidine (ZANAFLEX) 4 MG tablet TAKE ONE TABLET 3 TIMES DAILY AS NEEDED FOR MUSCLE SPASM MAY TAKE 1 EXTRADOSE DAILY AS NEEDED   traZODone (DESYREL) 50 MG tablet TAKE 1 AND 1/2 TABLETS BY MOUTH AT BEDTIME AS NEEDED FOR SLEEP   No facility-administered encounter medications on file as of 08/28/2021.     Recent Office Vitals: BP Readings from Last 3 Encounters:  02/20/21 128/76  11/10/20 122/68  08/08/20 140/88   Pulse Readings from Last 3 Encounters:  02/20/21 79  11/10/20 76  08/08/20 80    Wt Readings from Last 3 Encounters:  02/20/21 191 lb (86.6 kg)  11/10/20 188 lb (85.3 kg)  08/08/20 190 lb (86.2 kg)     Kidney Function Lab Results  Component Value Date/Time   CREATININE 1.52 (H) 03/05/2021 09:17 AM   CREATININE 1.51 (H) 02/20/2021 11:33 AM   CREATININE 1.1 09/29/2019 12:00 AM   CREATININE 1.20 (H) 02/13/2018 03:20 PM   CREATININE 1.01 09/13/2013 03:25 PM   GFR 44.94 (L) 03/05/2021 09:17 AM   GFRNONAA >60 12/07/2019 04:29 AM   GFRNONAA >60 09/13/2013 03:25 PM   GFRAA >60 12/07/2019 04:29 AM   GFRAA >60 09/13/2013 03:25 PM    BMP Latest Ref Rng & Units 03/05/2021 02/20/2021 11/10/2020  Glucose 70 - 99 mg/dL 244(H) 196(H) 204(H)  BUN 6 - 23 mg/dL 24(H) 18 20  Creatinine 0.40 - 1.50 mg/dL 1.52(H) 1.51(H) 1.33  BUN/Creat Ratio 6 -  22 (calc) - - -  Sodium 135 - 145 mEq/L 136 135 138  Potassium 3.5 - 5.1 mEq/L 4.4 4.9 4.9  Chloride 96 - 112 mEq/L 100 100 101  CO2 19 - 32 mEq/L 24 29 26   Calcium 8.4 - 10.5 mg/dL 9.1 9.1 9.4     Contacted patient on 09/04/21 to discuss hypertension disease state  Current antihypertensive regimen:   Irbesartan 150 mg - 1 tablet daily   Patient verbally confirms he is taking the above medications as directed. Yes  How often are you checking your Blood Pressure? In the morning when he gets awake   Current home BP readings:  Reports on 09/03/21 home BP was 138/99. The wife states they have had some home  issues for several weeks and unable to check BP often. They will do better in January 2023.  Wrist or arm cuff:  arm cuff Caffeine intake:  about 1 cup regular coffee in am  Salt intake:limits adding to food OTC medications including pseudoephedrine or NSAIDs? no  Any readings above 180/120? No  What recent interventions/DTPs have been made by any provider to improve Blood Pressure control since last CPP Visit:  none identified   Any recent hospitalizations or ED visits since last visit with CPP? No  What diet changes have been made to improve Blood Pressure Control?   The patient reports choosing grilled foods and not adding salt to foods.   What exercise is being done to improve your Blood Pressure Control?   No formal exercise, stays active around his home   Adherence Review: Is the patient currently on ACE/ARB medication? Yes Does the patient have >5 day gap between last estimated fill dates? No   Star Rating Drugs:  Medication:  Last Fill: Day Supply Invokana 300 mg        08/13/21              90 Glipizide 10 mg           07/23/21            90 Irbesartan 150 mg       08/13/21              90 Metformin 500 mg       07/11/21              90   Care Gaps: Annual wellness visit in last year? Yes Most Recent BP reading: 126/76  83-P 06/06/21  If Diabetic: Most recent A1C reading:  9.3 06/06/21 Last eye exam / retinopathy screening:07/11/21 Last diabetic foot exam: 2021  No appointments scheduled within the next 30 days.  Debbora Dus, CPP notified  Avel Sensor, [place] Assistant [phone]  I have reviewed the care management and care coordination activities outlined in this encounter and I am certifying that I agree with the content of this note. No further action required.  Debbora Dus, PharmD Clinical Pharmacist [facility] at [facility] [phone]

## 2021-09-17 ENCOUNTER — Other Ambulatory Visit: Payer: Self-pay | Admitting: Family Medicine

## 2021-09-29 ENCOUNTER — Other Ambulatory Visit: Payer: Self-pay | Admitting: Family Medicine

## 2021-10-01 NOTE — Telephone Encounter (Signed)
Refill request for Alprazolam 0.5 mg tablets  LOV - 02/20/21 Next OV - 11/12/21 Last refill - 06/12/21 #75/2  Patient also needs refill on trazodone 50 mg tablets

## 2021-10-02 NOTE — Telephone Encounter (Signed)
Sent. Thanks.   

## 2021-10-03 ENCOUNTER — Telehealth: Payer: Self-pay

## 2021-10-03 NOTE — Chronic Care Management (AMB) (Addendum)
Chronic Care Management Pharmacy Assistant   Name: Walter Harris  MRN: [id number] DOB: [birth year]  Reason for Encounter: Hypertension Disease State   Recent office visits:  None since last CCM contact  Recent consult visits:  07/11/21-Ophthalmology- no data found 06/06/21-Endocrinology-patient presented for follow up diabetes,Labs ordered, A1c 9.3,unable to tolerate zetia,foot exam today, follow up 6 months   Hospital visits:  None in previous 6 months  Medications: Outpatient Encounter Medications as of 10/03/2021  Medication Sig   ALPRAZolam (XANAX) 0.5 MG tablet TAKE ONE (1) TABLET BY MOUTH TWO TIMES PER DAY WITH AN EXTRA 1/2 TABLET IF NEEDED. SEDATION CAUTION   aspirin 81 MG EC tablet Take 1 tablet (81 mg total) by mouth daily. For stroke prevention   BD PEN NEEDLE NANO U/F 32G X 4 MM MISC USE AS DIRECTED   clopidogrel (PLAVIX) 75 MG tablet TAKE 1 TABLET BY MOUTH DAILY   FLUoxetine (PROZAC) 10 MG capsule TAKE 3 CAPSULES BY MOUTH DAILY FOR MOOD AS DIRECTED   glipiZIDE (GLUCOTROL XL) 10 MG 24 hr tablet Take 1 tablet (10 mg total) by mouth daily with breakfast. For diabetes   insulin glargine (LANTUS SOLOSTAR) 100 UNIT/ML Solostar Pen Inject 21 Units into the skin daily. For diabetes (Patient taking differently: Inject 24 Units into the skin daily. For diabetes)   irbesartan (AVAPRO) 150 MG tablet Take 1 tablet (150 mg total) by mouth daily. For blood pressure   ketoconazole (NIZORAL) 2 % cream Apply 1 application topically daily.   metFORMIN (GLUCOPHAGE-XR) 500 MG 24 hr tablet TAKE ONE TABLET 3 TIMES DAILY for diabetes   oxyCODONE-acetaminophen (PERCOCET/ROXICET) 5-325 MG tablet TAKE 1-2 TABLETS BY MOUTH 3 TIMES DAILY AS NEEDED FOR PAIN.   oxyCODONE-acetaminophen (PERCOCET/ROXICET) 5-325 MG tablet TAKE ONE TO TWO TABLETS BY MOUTH 3 TIMES DAILY IF NEEDED FOR PAIN. Fill on/after 09/16/2021   oxyCODONE-acetaminophen (PERCOCET/ROXICET) 5-325 MG tablet TAKE ONE TO TWO TABLETS BY MOUTH 3  TIMES DAILY IF NEEDED FOR PAIN.   primidone (MYSOLINE) 50 MG tablet TAKE 1 TABLET BY MOUTH DAILY   propranolol ER (INDERAL LA) 60 MG 24 hr capsule TAKE 1 CAPSULE EVERY DAY   tiZANidine (ZANAFLEX) 4 MG tablet TAKE ONE TABLET 3 TIMES DAILY AS NEEDED FOR MUSCLE SPASM MAY TAKE 1 EXTRADOSE DAILY AS NEEDED   traZODone (DESYREL) 50 MG tablet TAKE 1 AND 1/2 TABLETS BY MOUTH AT BEDTIME AS NEEDED FOR SLEEP   No facility-administered encounter medications on file as of 10/03/2021.     Recent Office Vitals: BP Readings from Last 3 Encounters:  02/20/21 128/76  11/10/20 122/68  08/08/20 140/88   Pulse Readings from Last 3 Encounters:  02/20/21 79  11/10/20 76  08/08/20 80    Wt Readings from Last 3 Encounters:  02/20/21 191 lb (86.6 kg)  11/10/20 188 lb (85.3 kg)  08/08/20 190 lb (86.2 kg)     Kidney Function Lab Results  Component Value Date/Time   CREATININE 1.52 (H) 03/05/2021 09:17 AM   CREATININE 1.51 (H) 02/20/2021 11:33 AM   CREATININE 1.1 09/29/2019 12:00 AM   CREATININE 1.20 (H) 02/13/2018 03:20 PM   CREATININE 1.01 09/13/2013 03:25 PM   GFR 44.94 (L) 03/05/2021 09:17 AM   GFRNONAA >60 12/07/2019 04:29 AM   GFRNONAA >60 09/13/2013 03:25 PM   GFRAA >60 12/07/2019 04:29 AM   GFRAA >60 09/13/2013 03:25 PM    BMP Latest Ref Rng & Units 03/05/2021 02/20/2021 11/10/2020  Glucose 70 - 99 mg/dL 244(H) 196(H) 204(H)  BUN 6 - 23 mg/dL 24(H) 18 20  Creatinine 0.40 - 1.50 mg/dL 1.52(H) 1.51(H) 1.33  BUN/Creat Ratio 6 - 22 (calc) - - -  Sodium 135 - 145 mEq/L 136 135 138  Potassium 3.5 - 5.1 mEq/L 4.4 4.9 4.9  Chloride 96 - 112 mEq/L 100 100 101  CO2 19 - 32 mEq/L 24 29 26   Calcium 8.4 - 10.5 mg/dL 9.1 9.1 9.4     Contacted patient on 10/03/21 to discuss hypertension disease state  Current antihypertensive regimen:   Irbesartan 150 mg - 1 tablet daily   Patient verbally confirms he is taking the above medications as directed. Yes  How often are you checking your Blood Pressure?  daily  he checks his blood pressure  at different times during the day and night   Current home BP readings:   DATE:             BP               PULSE 09/05/21  138/101 - 09/07/21  77/64  - January 2023 113/76 129/86 129/83 110/83 134/78 135/82  Wrist or arm cuff: arm cuff Salt intake: limits adding to foods Over the counter medications including pseudoephedrine or NSAIDs? No  Any readings above 180/120? No  What recent interventions/DTPs have been made by any provider to improve Blood Pressure control since last CPP Visit: Patient is monitoring BP more often at home aware to contact PCP for abnormal readings or symptoms associated  Any recent hospitalizations or ED visits since last visit with CPP? No  What diet changes have been made to improve Blood Pressure Control?   Patient limits salt intake  What exercise is being done to improve your Blood Pressure Control?  None identified  Adherence Review: Is the patient currently on ACE/ARB medication? Yes Does the patient have >5 day gap between last estimated fill dates? No   Star Rating Drugs:  Medication:  Last Fill: Day Supply Lantus   07/16/21 30 Glipizide 10mg  07/23/21 90 Irbesartan 150mg  08/13/21 90 Metformin XR 500mg  07/11/21 90  Care Gaps: Annual wellness visit in last year? Yes Most Recent BP reading:126/76  83-P 06/06/21  If Diabetic: Most recent A1C reading: 9.3%  06/06/21 (pt's treatment has been based on fructosamine per endocrinology) Last eye exam / retinopathy screening: 07/06/20  No retinopathy per chart Last diabetic foot exam: 06/06/21  Upcoming appointments: PCP appointment on 10/29/21  Debbora Dus, CPP notified  Avel Sensor, [place] Assistant [phone]  I have reviewed the care management and care coordination activities outlined in this encounter and I am certifying that I agree with the content of this note. No further action required.  Debbora Dus,  PharmD Clinical Pharmacist [place] [facility] at [facility] [phone]

## 2021-10-10 DIAGNOSIS — D2261 Melanocytic nevi of right upper limb, including shoulder: Secondary | ICD-10-CM | POA: Diagnosis not present

## 2021-10-10 DIAGNOSIS — D2271 Melanocytic nevi of right lower limb, including hip: Secondary | ICD-10-CM | POA: Diagnosis not present

## 2021-10-10 DIAGNOSIS — Z85828 Personal history of other malignant neoplasm of skin: Secondary | ICD-10-CM | POA: Diagnosis not present

## 2021-10-10 DIAGNOSIS — D2272 Melanocytic nevi of left lower limb, including hip: Secondary | ICD-10-CM | POA: Diagnosis not present

## 2021-10-10 DIAGNOSIS — L570 Actinic keratosis: Secondary | ICD-10-CM | POA: Diagnosis not present

## 2021-10-10 DIAGNOSIS — D2262 Melanocytic nevi of left upper limb, including shoulder: Secondary | ICD-10-CM | POA: Diagnosis not present

## 2021-10-10 DIAGNOSIS — L821 Other seborrheic keratosis: Secondary | ICD-10-CM | POA: Diagnosis not present

## 2021-10-29 ENCOUNTER — Ambulatory Visit: Payer: PPO

## 2021-10-31 ENCOUNTER — Ambulatory Visit (INDEPENDENT_AMBULATORY_CARE_PROVIDER_SITE_OTHER): Payer: PPO

## 2021-10-31 VITALS — Ht 68.0 in | Wt 191.0 lb

## 2021-10-31 DIAGNOSIS — Z0000 Encounter for general adult medical examination without abnormal findings: Secondary | ICD-10-CM | POA: Diagnosis not present

## 2021-10-31 NOTE — Progress Notes (Signed)
Subjective:   Walter Harris is a 76 y.o. male who presents for Medicare Annual/Subsequent preventive examination.  I connected with Walter Harris today by telephone and verified that I am speaking with the correct person using two identifiers. Location patient: home Location provider: work Persons participating in the virtual visit: patient, wife and nurse.    I discussed the limitations, risks, security and privacy concerns of performing an evaluation and management service by telephone and the availability of in person appointments. I also discussed with the patient that there may be a patient responsible charge related to this service. The patient expressed understanding and verbally consented to this telephonic visit.    Interactive audio and video telecommunications were attempted between this provider and patient, however failed, due to patient having technical difficulties OR patient did not have access to video capability.  We continued and completed visit with audio only.  Some vital signs may be absent or patient reported.   Time Spent with patient on telephone encounter: 20 minutes  Review of Systems     Cardiac Risk Factors include: advanced age (>96men, >70 women);diabetes mellitus;dyslipidemia     Objective:    Today's Vitals   10/31/21 0944  Weight: 191 lb (86.6 kg)  Height: 5\' 8"  (1.727 m)  PainSc: 4    Body mass index is 29.04 kg/m.  Advanced Directives 10/31/2021 10/27/2020 05/29/2020 01/04/2020 12/15/2019 12/06/2019 10/01/2019  Does Patient Have a Medical Advance Directive? Yes Yes Yes Yes Yes Yes Yes  Type of Paramedic of [place];Living will [place];Living will [place];Living will [place];Living will [place];Living will [place];Living will [place];Living will  Does patient want to make changes to medical advance  directive? Yes (MAU/Ambulatory/Procedural Areas - Information given) - No - Patient declined - - No - Patient declined -  Copy of [place] in Chart? - No - copy requested No - copy requested - - No - copy requested No - copy requested  Would patient like information on creating a medical advance directive? - - - - - - -    Current Medications (verified) Outpatient Encounter Medications as of 10/31/2021  Medication Sig   ALPRAZolam (XANAX) 0.5 MG tablet TAKE ONE (1) TABLET BY MOUTH TWO TIMES PER DAY WITH AN EXTRA 1/2 TABLET IF NEEDED. SEDATION CAUTION   aspirin 81 MG EC tablet Take 1 tablet (81 mg total) by mouth daily. For stroke prevention   BD PEN NEEDLE NANO U/F 32G X 4 MM MISC USE AS DIRECTED   clopidogrel (PLAVIX) 75 MG tablet TAKE 1 TABLET BY MOUTH DAILY   FLUoxetine (PROZAC) 10 MG capsule TAKE 3 CAPSULES BY MOUTH DAILY FOR MOOD AS DIRECTED   glipiZIDE (GLUCOTROL XL) 10 MG 24 hr tablet Take 1 tablet (10 mg total) by mouth daily with breakfast. For diabetes   insulin glargine (LANTUS SOLOSTAR) 100 UNIT/ML Solostar Pen Inject 21 Units into the skin daily. For diabetes (Patient taking differently: Inject 24 Units into the skin daily. For diabetes)   irbesartan (AVAPRO) 150 MG tablet Take 1 tablet (150 mg total) by mouth daily. For blood pressure   ketoconazole (NIZORAL) 2 % cream Apply 1 application topically daily.   metFORMIN (GLUCOPHAGE-XR) 500 MG 24 hr tablet TAKE ONE TABLET 3 TIMES DAILY for diabetes   oxyCODONE-acetaminophen (PERCOCET/ROXICET) 5-325 MG tablet TAKE 1-2 TABLETS BY MOUTH 3 TIMES DAILY AS NEEDED FOR PAIN.  oxyCODONE-acetaminophen (PERCOCET/ROXICET) 5-325 MG tablet TAKE ONE TO TWO TABLETS BY MOUTH 3 TIMES DAILY IF NEEDED FOR PAIN. Fill on/after 09/16/2021   oxyCODONE-acetaminophen (PERCOCET/ROXICET) 5-325 MG tablet TAKE ONE TO TWO TABLETS BY MOUTH 3 TIMES DAILY IF NEEDED FOR PAIN.   primidone (MYSOLINE) 50 MG tablet TAKE 1 TABLET BY MOUTH DAILY    propranolol ER (INDERAL LA) 60 MG 24 hr capsule TAKE 1 CAPSULE EVERY DAY   tiZANidine (ZANAFLEX) 4 MG tablet TAKE ONE TABLET 3 TIMES DAILY AS NEEDED FOR MUSCLE SPASM MAY TAKE 1 EXTRADOSE DAILY AS NEEDED   traZODone (DESYREL) 50 MG tablet TAKE 1 AND 1/2 TABLETS BY MOUTH AT BEDTIME AS NEEDED FOR SLEEP   No facility-administered encounter medications on file as of 10/31/2021.    Allergies (verified) Bee venom, Pravastatin, Ace inhibitors, Actos [pioglitazone], Angiotensin receptor blockers, Aripiprazole, Crestor [rosuvastatin calcium], Invokana [canagliflozin], Metformin and related, Nsaids, Olanzapine, Pneumovax 23 [pneumococcal vac polyvalent], Codeine, Divalproex sodium, Lithium carbonate, and Sulfamethoxazole-trimethoprim   History: Past Medical History:  Diagnosis Date   Basal cell carcinoma    multiple   Cataracts, both eyes 2019   surgery pending April 9767   Complication of anesthesia    post-operative cognitive dysfunction after ACDF on 05/01/11 [facility])   Deaf    right ear   Diabetes mellitus    DJD (degenerative joint disease)    lumbar spine   Gait abnormality 11/12/2019   Hearing impaired    hearing aids-transmitter rt    History of blood transfusion    as a baby in 1948   History of colonic polyps    Hyperlipidemia    Hypertension    pt. denies high blood pressure   Kidney stone 1967   Memory change 12/17/2017   Nose fracture    3x   OCD (obsessive compulsive disorder)    Stroke ([place])    "mini stroke, that's what gave me the tremors"   Stuttering    Tremor, essential    Vertigo    random   Wears dentures    full upper   Wears glasses    Past Surgical History:  Procedure Laterality Date   APPENDECTOMY  1960   BACK SURGERY  2012   lumbar surgery   CATARACT EXTRACTION W/PHACO Right 04/12/2019   Procedure: CATARACT EXTRACTION PHACO AND INTRAOCULAR LENS PLACEMENT ([place]) RIGHT, DIABETIC;  Surgeon: [name], MD;  Location: [place];  Service:  Ophthalmology;  Laterality: Right;  Diabetic - insulin and oral meds   CATARACT EXTRACTION W/PHACO Left 05/10/2019   Procedure: CATARACT EXTRACTION PHACO AND INTRAOCULAR LENS PLACEMENT ([facility])  LEFT DIABETIC  00:25.6  14.4%  3.75;  Surgeon: [name], MD;  Location: [place];  Service: Ophthalmology;  Laterality: Left;  Diabetic - insulin and oral meds   CERVICAL FUSION  1990   CERVICAL FUSION  8/12   multiple levels with plates   [place]     bilateral, nerve improvement left 08/04, right 09/04   COLONOSCOPY     CYST EXCISION     back of cervical spine total of 5 surgeries   EXTERNAL EAR SURGERY     multiple ear surguries as a child   OTHER SURGICAL HISTORY     benign head tumor#5   OTHER SURGICAL HISTORY     sebaceous cysts-post neck x5   SHOULDER ARTHROSCOPY W/ ACROMIAL REPAIR  08/2004   left shoulder   TONSILLECTOMY  ULNAR NERVE TRANSPOSITION Right 05/13/2013   Procedure: RIGHT ULNAR NERVE DECOMPRESSION/TRANSPOSITION;  Surgeon: [name]., MD;  Location: [place];  Service: Orthopedics;  Laterality: Right;   Family History  Problem Relation Age of Onset   OCD Mother    Bipolar disorder Mother    Cancer Mother    Emphysema Mother        never smoker, 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   Arthritis Sister    OCD Sister    Colon cancer Neg Hx    Prostate cancer Neg Hx    Social History   Socioeconomic History   Marital status: Married    Spouse name: Not on file   Number of children: 1   Years of education: Not on file   Highest education level: Not on file  Occupational History   Occupation: disabled    Employer: RETIRED    Comment: Dealer  Tobacco Use   Smoking status: Former    Packs/day: 0.50    Years: 40.00    Pack years: 20.00    Types: Pipe, Cigarettes    Quit date: 06/09/2016    Years since quitting: 5.3   Smokeless tobacco: Never  Vaping Use   Vaping Use: Never  used  Substance and Sexual Activity   Alcohol use: No    Alcohol/week: 0.0 standard drinks   Drug use: No   Sexual activity: Never  Other Topics Concern   Not on file  Social History Narrative   Married, 1992   1 biological child, some contact as of 2017   Wife has 2 kids, no contact as of 2017   Prev worked as Designer, television/film set, Lobbyist, etc   Caffeine use: 1/2 cup every morning- coffee   Right handed    Social Determinants of Radio broadcast assistant Strain: Low Risk    Difficulty of Paying Living Expenses: Not hard at all  Food Insecurity: No Food Insecurity   Worried About Charity fundraiser in the Last Year: Never true   Arboriculturist in the Last Year: Never true  Transportation Needs: No Transportation Needs   Lack of Transportation (Medical): No   Lack of Transportation (Non-Medical): No  Physical Activity: Inactive   Days of Exercise per Week: 0 days   Minutes of Exercise per Session: 0 min  Stress: No Stress Concern Present   Feeling of Stress : Not at all  Social Connections: Socially Isolated   Frequency of Communication with Friends and Family: Once a week   Frequency of Social Gatherings with Friends and Family: Never   Attends Religious Services: Never   Printmaker: No   Attends Music therapist: Never   Marital Status: Married    Tobacco Counseling Counseling given: Not Answered   Clinical Intake:  Pre-visit preparation completed: Yes  Pain : 0-10 Pain Score: 4  Pain Location: Leg Pain Orientation: Right, Left     BMI - recorded: 29.04 Nutritional Status: BMI 25 -29 Overweight Nutritional Risks: None Diabetes: Yes CBG done?: No Did pt. bring in CBG monitor from home?: No  How often do you need to have someone help you when you read instructions, pamphlets, or other written materials from your doctor or pharmacy?: 1 - Never  Diabetes:  Is the patient diabetic?  Yes  If  diabetic, was a CBG obtained today?  No  Did the patient bring in their glucometer from home?  No  How often do you monitor your CBG's? daily.   Financial Strains and Diabetes Management:  Are you having any financial strains with the device, your supplies or your medication? No .  Does the patient want to be seen by Chronic Care Management for management of their diabetes?  No  Would the patient like to be referred to a Nutritionist or for Diabetic Management?  No   Diabetic Exams:  Diabetic Eye Exam: Completed 07/11/21.   Diabetic Foot Exam: Pt has an upcoming appointment with the PCP 11/12/21.  Interpreter Needed?: No  Information entered by :: Orrin Brigham LPN   Activities of Daily Living In your present state of health, do you have any difficulty performing the following activities: 10/31/2021  Hearing? Y  Vision? N  Difficulty concentrating or making decisions? N  Walking or climbing stairs? Y  Dressing or bathing? N  Comment uses cane or walker  Doing errands, shopping? Y  Comment wife [place] and eating ? N  Using the Toilet? N  In the past six months, have you accidently leaked urine? N  Do you have problems with loss of bowel control? N  Managing your Medications? Y  Comment wife assist  Managing your Finances? Y  Comment wife assist  Housekeeping or managing your Housekeeping? Y  Comment wife assist  Some recent data might be hidden    Patient Care Team: [name], MD as PCP - General (Family Medicine) Kary Kos, MD as Consulting Physician (Neurosurgery) Dingeldein, Remo Lipps, MD as Referring Physician (Ophthalmology) Oneta Rack, MD as Referring Physician (Dermatology) Kary Kos, MD as Consulting Physician (Neurosurgery) Debbora Dus, [facility] as Pharmacist (Pharmacist)  Indicate any recent Medical Services you may have received from other than [facility] providers in the past year (date may be approximate).     Assessment:   This is  a routine wellness examination for Boris.  Hearing/Vision screen Hearing Screening - Comments:: Wears hearing aids Vision Screening - Comments:: Last exam 07/11/21, Dr. Jeni Salles, wears glasses for reading   Dietary issues and exercise activities discussed: Current Exercise Habits: The patient does not participate in regular exercise at present   Goals Addressed             This Visit's Progress    Patient Stated       Would like to maintain current routine       Depression Screen PHQ 2/9 Scores 10/31/2021 10/27/2020 10/01/2019 09/28/2018 09/25/2017 03/14/2016 03/07/2015  PHQ - 2 Score 0 0 0 0 0 0 1  PHQ- 9 Score - 0 0 0 0 - -    Fall Risk Fall Risk  10/31/2021 10/27/2020 10/01/2019 09/28/2018 09/25/2017  Falls in the past year? 0 1 1 1  No  Comment - - balance issues multiple falls due to vertigo; unable to specify quanity -  Number falls in past yr: 0 0 1 1 -  Injury with Fall? 0 0 0 0 -  Risk Factor Category  - - - - -  Risk for fall due to : Impaired balance/gait Impaired balance/gait;Medication side effect Impaired balance/gait;Medication side effect;History of fall(s) - -  Follow up Falls prevention discussed Falls evaluation completed;Falls prevention discussed Falls evaluation completed;Falls prevention discussed - -    FALL RISK PREVENTION PERTAINING TO THE HOME:  Any stairs in or around the home? No  If so, are there any without handrails? No  Home free of loose throw rugs in walkways, pet beds, electrical cords, etc? Yes  Adequate lighting in your home to reduce risk of falls? Yes   ASSISTIVE DEVICES UTILIZED TO PREVENT FALLS:  Life alert? No  Use of a cane, walker or w/c? Yes , cane and walker Grab bars in the bathroom? Yes  Shower chair or bench in shower? Yes  Elevated toilet seat or a handicapped toilet? No   TIMED UP AND GO:  Was the test performed? No .    Cognitive Function: Normal cognitive status assessed by this Nurse Health Advisor. No abnormalities  found.   MMSE - Mini Mental State Exam 10/27/2020 11/12/2019 10/01/2019 09/28/2018 12/17/2017  Not completed: Unable to complete - - - -  Orientation to time - 5 5 5 4   Orientation to Place - 5 5 5 5   Registration - 3 3 3 3   Attention/ Calculation - 3 5 0 5  Recall - 3 3 2 3   Recall-comments - - - unable to recall 1 of 3 words -  Language- name 2 objects - 2 - 0 2  Language- repeat - 1 1 1 1   Language- follow 3 step command - 3 - 3 3  Language- follow 3 step command-comments - - - - -  Language- read & follow direction - 1 - 0 1  Write a sentence - 0 - 0 1  Copy design - 0 - 0 1  Total score - 26 - 19 29        Immunizations Immunization History  Administered Date(s) Administered   Influenza Split 07/10/2011, 05/28/2012   Influenza Whole 06/08/2008, 05/29/2009, 05/22/2010   Influenza, High Dose Seasonal PF 06/09/2018, 05/30/2020   Influenza,inj,Quad PF,6+ Mos 06/13/2017, 06/09/2018, 05/04/2019   Influenza-Unspecified 06/30/2015, 07/12/2016, 06/13/2017, 06/09/2018, 05/04/2019   Moderna Sars-Covid-2 Vaccination 07/21/2020   PFIZER(Purple Top)SARS-COV-2 Vaccination 11/04/2019, 12/02/2019   Pneumococcal Conjugate-13 09/09/2014   Pneumococcal Polysaccharide-23 02/19/2010, 01/03/2016   Td 11/24/2006   Tdap 12/13/2016   Zoster Recombinat (Shingrix) 04/22/2020, 06/27/2020   Zoster, Live 12/22/2009    TDAP status: Up to date  Flu Vaccine status: Up to date  Pneumococcal vaccine status: Up to date  Covid-19 vaccine status: Information provided on how to obtain vaccines.   Qualifies for Shingles Vaccine? Yes   Zostavax completed Yes   Shingrix Completed?: Yes  Screening Tests Health Maintenance  Topic Date Due   COVID-19 Vaccine (4 - Booster) 09/15/2020   INFLUENZA VACCINE  04/09/2021   FOOT EXAM  04/25/2021   HEMOGLOBIN A1C  05/13/2021   COLONOSCOPY (Pts 45-25yrs Insurance coverage will need to be confirmed)  04/14/2022   OPHTHALMOLOGY EXAM  07/11/2022   TETANUS/TDAP   12/14/2026   Pneumonia Vaccine 88+ Years old  Completed   Hepatitis C Screening  Completed   Zoster Vaccines- Shingrix  Completed   HPV VACCINES  Aged Out    Health Maintenance  Health Maintenance Due  Topic Date Due   COVID-19 Vaccine (4 - Booster) 09/15/2020   INFLUENZA VACCINE  04/09/2021   FOOT EXAM  04/25/2021   HEMOGLOBIN A1C  05/13/2021    Colorectal cancer screening: Type of screening: Colonoscopy. Completed 04/14/12. Repeat every 10 years  Lung Cancer Screening: (Low Dose CT Chest recommended if Age 35-80 years, 30 pack-year currently smoking OR have quit w/in 15years.) does qualify.     Additional Screening:  Hepatitis C Screening: does qualify; Completed 10/04/15  Vision Screening: Recommended annual ophthalmology exams for early detection of glaucoma and other disorders of the eye. Is the patient up to date with their annual eye  exam?  Yes  Who is the provider or what is the name of the office in which the patient attends annual eye exams? Dr. Thomasene Ripple   Dental Screening: Recommended annual dental exams for proper oral hygiene  Community Resource Referral / Chronic Care Management: CRR required this visit?  No   CCM required this visit?  No      Plan:     I have personally reviewed and noted the following in the patients chart:   Medical and social history Use of alcohol, tobacco or illicit drugs  Current medications and supplements including opioid prescriptions. Patient is currently taking opioid prescriptions. Information provided to patient regarding non-opioid alternatives. Patient advised to discuss non-opioid treatment plan with their provider. Functional ability and status Nutritional status Physical activity Advanced directives List of other physicians Hospitalizations, surgeries, and ER visits in previous 12 months Vitals Screenings to include cognitive, depression, and falls Referrals and appointments  In addition, I have reviewed and  discussed with patient certain preventive protocols, quality metrics, and best practice recommendations. A written personalized care plan for preventive services as well as general preventive health recommendations were provided to patient.   Due to this being a telephonic visit, the after visit summary with patients personalized plan was offered to patient via mail or my-chart. Patient would like to access on my-chart.    Loma Messing, LPN   3/50/0938   Nurse Health Advisor  Nurse Notes: patient plans to provide update flu vaccine information when available

## 2021-10-31 NOTE — Patient Instructions (Signed)
[name] , Thank you for taking time to complete your Medicare Wellness Visit. I appreciate your ongoing commitment to your health goals. Please review the following plan we discussed and let me know if I can assist you in the future.   Screening recommendations/referrals: Colonoscopy: up to date  Recommended yearly ophthalmology/optometry visit for glaucoma screening and checkup Recommended yearly dental visit for hygiene and checkup  Vaccinations: Influenza vaccine: up to date , please provide updated vaccine information when available  Pneumococcal vaccine: up to date  Tdap vaccine: up to date  Shingles vaccine: up to date    Covid-19: newest booster available at your local pharmacy  Advanced directives: Please bring a copy of Living Will and/or [place] for your chart.   Conditions/risks identified: see problem list   Next appointment: Follow up in one year for your annual wellness visit. 11/01/22 @ 9:45am, this will be a telephone visit   Preventive Care 11 Years and Older, Male Preventive care refers to lifestyle choices and visits with your health care provider that can promote health and wellness. What does preventive care include? A yearly physical exam. This is also called an annual well check. Dental exams once or twice a year. Routine eye exams. Ask your health care provider how often you should have your eyes checked. Personal lifestyle choices, including: Daily care of your teeth and gums. Regular physical activity. Eating a healthy diet. Avoiding tobacco and drug use. Limiting alcohol use. Practicing safe sex. Taking low doses of aspirin every day. Taking vitamin and mineral supplements as recommended by your health care provider. What happens during an annual well check? The services and screenings done by your health care provider during your annual well check will depend on your age, overall health, lifestyle risk factors, and family history  of disease. Counseling  Your health care provider may ask you questions about your: Alcohol use. Tobacco use. Drug use. Emotional well-being. Home and relationship well-being. Sexual activity. Eating habits. History of falls. Memory and ability to understand (cognition). Work and work Statistician. Screening  You may have the following tests or measurements: Height, weight, and BMI. Blood pressure. Lipid and cholesterol levels. These may be checked every 5 years, or more frequently if you are over 24 years old. Skin check. Lung cancer screening. You may have this screening every year starting at age 19 if you have a 30-pack-year history of smoking and currently smoke or have quit within the past 15 years. Fecal occult blood test (FOBT) of the stool. You may have this test every year starting at age 52. Flexible sigmoidoscopy or colonoscopy. You may have a sigmoidoscopy every 5 years or a colonoscopy every 10 years starting at age 57. Prostate cancer screening. Recommendations will vary depending on your family history and other risks. Hepatitis C blood test. Hepatitis B blood test. Sexually transmitted disease (STD) testing. Diabetes screening. This is done by checking your blood sugar (glucose) after you have not eaten for a while (fasting). You may have this done every 1-3 years. Abdominal aortic aneurysm (AAA) screening. You may need this if you are a current or former smoker. Osteoporosis. You may be screened starting at age 21 if you are at high risk. Talk with your health care provider about your test results, treatment options, and if necessary, the need for more tests. Vaccines  Your health care provider may recommend certain vaccines, such as: Influenza vaccine. This is recommended every year. Tetanus, diphtheria, and acellular pertussis (Tdap,  Td) vaccine. You may need a Td booster every 10 years. Zoster vaccine. You may need this after age 60. Pneumococcal 13-valent  conjugate (PCV13) vaccine. One dose is recommended after age 21. Pneumococcal polysaccharide (PPSV23) vaccine. One dose is recommended after age 60. Talk to your health care provider about which screenings and vaccines you need and how often you need them. This information is not intended to replace advice given to you by your health care provider. Make sure you discuss any questions you have with your health care provider. Document Released: 09/22/2015 Document Revised: 05/15/2016 Document Reviewed: 06/27/2015 Elsevier Interactive Patient Education  2017 [place] Prevention in the Home Falls can cause injuries. They can happen to people of all ages. There are many things you can do to make your home safe and to help prevent falls. What can I do on the outside of my home? Regularly fix the edges of walkways and driveways and fix any cracks. Remove anything that might make you trip as you walk through a door, such as a raised step or threshold. Trim any bushes or trees on the path to your home. Use bright outdoor lighting. Clear any walking paths of anything that might make someone trip, such as rocks or tools. Regularly check to see if handrails are loose or broken. Make sure that both sides of any steps have handrails. Any raised decks and porches should have guardrails on the edges. Have any leaves, snow, or ice cleared regularly. Use sand or salt on walking paths during winter. Clean up any spills in your garage right away. This includes oil or grease spills. What can I do in the bathroom? Use night lights. Install grab bars by the toilet and in the tub and shower. Do not use towel bars as grab bars. Use non-skid mats or decals in the tub or shower. If you need to sit down in the shower, use a plastic, non-slip stool. Keep the floor dry. Clean up any water that spills on the floor as soon as it happens. Remove soap buildup in the tub or shower regularly. Attach bath mats  securely with double-sided non-slip rug tape. Do not have throw rugs and other things on the floor that can make you trip. What can I do in the bedroom? Use night lights. Make sure that you have a light by your bed that is easy to reach. Do not use any sheets or blankets that are too big for your bed. They should not hang down onto the floor. Have a firm chair that has side arms. You can use this for support while you get dressed. Do not have throw rugs and other things on the floor that can make you trip. What can I do in the kitchen? Clean up any spills right away. Avoid walking on wet floors. Keep items that you use a lot in easy-to-reach places. If you need to reach something above you, use a strong step stool that has a grab bar. Keep electrical cords out of the way. Do not use floor polish or wax that makes floors slippery. If you must use wax, use non-skid floor wax. Do not have throw rugs and other things on the floor that can make you trip. What can I do with my stairs? Do not leave any items on the stairs. Make sure that there are handrails on both sides of the stairs and use them. Fix handrails that are broken or loose. Make sure that handrails are as  long as the stairways. Check any carpeting to make sure that it is firmly attached to the stairs. Fix any carpet that is loose or worn. Avoid having throw rugs at the top or bottom of the stairs. If you do have throw rugs, attach them to the floor with carpet tape. Make sure that you have a light switch at the top of the stairs and the bottom of the stairs. If you do not have them, ask someone to add them for you. What else can I do to help prevent falls? Wear shoes that: Do not have high heels. Have rubber bottoms. Are comfortable and fit you well. Are closed at the toe. Do not wear sandals. If you use a stepladder: Make sure that it is fully opened. Do not climb a closed stepladder. Make sure that both sides of the stepladder  are locked into place. Ask someone to hold it for you, if possible. Clearly mark and make sure that you can see: Any grab bars or handrails. First and last steps. Where the edge of each step is. Use tools that help you move around (mobility aids) if they are needed. These include: Canes. Walkers. Scooters. Crutches. Turn on the lights when you go into a dark area. Replace any light bulbs as soon as they burn out. Set up your furniture so you have a clear path. Avoid moving your furniture around. If any of your floors are uneven, fix them. If there are any pets around you, be aware of where they are. Review your medicines with your doctor. Some medicines can make you feel dizzy. This can increase your chance of falling. Ask your doctor what other things that you can do to help prevent falls. This information is not intended to replace advice given to you by your health care provider. Make sure you discuss any questions you have with your health care provider. Document Released: 06/22/2009 Document Revised: 02/01/2016 Document Reviewed: 09/30/2014 Elsevier Interactive Patient Education  2017 [employer].

## 2021-11-12 ENCOUNTER — Encounter: Payer: PPO | Admitting: Family Medicine

## 2021-11-26 ENCOUNTER — Telehealth: Payer: Self-pay

## 2021-11-26 NOTE — Chronic Care Management (AMB) (Signed)
? ? ?  Chronic Care Management ?Pharmacy Assistant  ? ?Name: Walter Harris  MRN: [id number] DOB: [birth year] ? ? ?Reason for Encounter: Reminder call ?  ?Conditions to be addressed/monitored: ?HTN, HLD, and DMII ? ? ?Medications: ?Outpatient Encounter Medications as of 11/26/2021  ?Medication Sig  ? ALPRAZolam (XANAX) 0.5 MG tablet TAKE ONE (1) TABLET BY MOUTH TWO TIMES PER DAY WITH AN EXTRA 1/2 TABLET IF NEEDED. SEDATION CAUTION  ? aspirin 81 MG EC tablet Take 1 tablet (81 mg total) by mouth daily. For stroke prevention  ? BD PEN NEEDLE NANO U/F 32G X 4 MM MISC USE AS DIRECTED  ? clopidogrel (PLAVIX) 75 MG tablet TAKE 1 TABLET BY MOUTH DAILY  ? FLUoxetine (PROZAC) 10 MG capsule TAKE 3 CAPSULES BY MOUTH DAILY FOR MOOD AS DIRECTED  ? glipiZIDE (GLUCOTROL XL) 10 MG 24 hr tablet Take 1 tablet (10 mg total) by mouth daily with breakfast. For diabetes  ? insulin glargine (LANTUS SOLOSTAR) 100 UNIT/ML Solostar Pen Inject 21 Units into the skin daily. For diabetes (Patient taking differently: Inject 24 Units into the skin daily. For diabetes)  ? irbesartan (AVAPRO) 150 MG tablet Take 1 tablet (150 mg total) by mouth daily. For blood pressure  ? ketoconazole (NIZORAL) 2 % cream Apply 1 application topically daily.  ? metFORMIN (GLUCOPHAGE-XR) 500 MG 24 hr tablet TAKE ONE TABLET 3 TIMES DAILY for diabetes  ? oxyCODONE-acetaminophen (PERCOCET/ROXICET) 5-325 MG tablet TAKE 1-2 TABLETS BY MOUTH 3 TIMES DAILY AS NEEDED FOR PAIN.  ? oxyCODONE-acetaminophen (PERCOCET/ROXICET) 5-325 MG tablet TAKE ONE TO TWO TABLETS BY MOUTH 3 TIMES DAILY IF NEEDED FOR PAIN. Fill on/after 09/16/2021  ? oxyCODONE-acetaminophen (PERCOCET/ROXICET) 5-325 MG tablet TAKE ONE TO TWO TABLETS BY MOUTH 3 TIMES DAILY IF NEEDED FOR PAIN.  ? primidone (MYSOLINE) 50 MG tablet TAKE 1 TABLET BY MOUTH DAILY  ? propranolol ER (INDERAL LA) 60 MG 24 hr capsule TAKE 1 CAPSULE EVERY DAY  ? tiZANidine (ZANAFLEX) 4 MG tablet TAKE ONE TABLET 3 TIMES DAILY AS NEEDED FOR MUSCLE  SPASM MAY TAKE 1 EXTRADOSE DAILY AS NEEDED  ? traZODone (DESYREL) 50 MG tablet TAKE 1 AND 1/2 TABLETS BY MOUTH AT BEDTIME AS NEEDED FOR SLEEP  ? ?No facility-administered encounter medications on file as of 11/26/2021.  ? ?Walter Harris did not answer the telephone  to remind of upcoming telephone visit with Charlene Brooke on 11/29/21 at 11:00am. Patient was reminded to have any blood glucose and blood pressure readings available for review at appointment. If unable to reach, a voicemail was left for patient.  ? ? ? ? ?Star Rating Drugs: ?Medication:  Last Fill: Day Supply ?Glipizide '10mg'$  10/23/21 90 ?Lantus   09/29/21 30 ?Irbesartan '150mg'$  08/13/21 90 ?Metformin XR '500mg'$   10/11/21  90 ? ?Charlene Brooke, CPP notified ? ?Matylda Fehring, CCMA ?Health concierge  ?[phone]  ?

## 2021-11-29 ENCOUNTER — Ambulatory Visit (INDEPENDENT_AMBULATORY_CARE_PROVIDER_SITE_OTHER): Payer: PPO | Admitting: Pharmacist

## 2021-11-29 ENCOUNTER — Other Ambulatory Visit: Payer: Self-pay | Admitting: Family Medicine

## 2021-11-29 ENCOUNTER — Other Ambulatory Visit: Payer: Self-pay

## 2021-11-29 DIAGNOSIS — G250 Essential tremor: Secondary | ICD-10-CM

## 2021-11-29 DIAGNOSIS — Z8673 Personal history of transient ischemic attack (TIA), and cerebral infarction without residual deficits: Secondary | ICD-10-CM

## 2021-11-29 DIAGNOSIS — E1165 Type 2 diabetes mellitus with hyperglycemia: Secondary | ICD-10-CM

## 2021-11-29 DIAGNOSIS — I10 Essential (primary) hypertension: Secondary | ICD-10-CM

## 2021-11-29 DIAGNOSIS — E1169 Type 2 diabetes mellitus with other specified complication: Secondary | ICD-10-CM

## 2021-11-29 DIAGNOSIS — G720 Drug-induced myopathy: Secondary | ICD-10-CM

## 2021-11-29 DIAGNOSIS — F411 Generalized anxiety disorder: Secondary | ICD-10-CM

## 2021-11-29 NOTE — Patient Instructions (Signed)
Visit Information ? ?Phone number for Pharmacist: [phone] ? ? Goals Addressed   ?None ?  ? ? ?Care Plan : [place]  ?Updates made by Walter Harris, RPH since 11/29/2021 12:00 AM  ?  ? ?Problem: Hypertension, Hyperlipidemia, Diabetes, Chronic Kidney Disease, and Anxiety, Hx CVA   ?Priority: High  ?  ? ?Long-Range Goal: Disease Management   ?Start Date: 03/06/2021  ?Expected End Date: 11/30/2022  ?This Visit's Progress: On track  ?Priority: High  ?Note:   ?Current Barriers:  ?Unable to independently monitor therapeutic efficacy ? ?Pharmacist Clinical Goal(s):  ?Patient will achieve adherence to monitoring guidelines and medication adherence to achieve therapeutic efficacy through collaboration with PharmD and provider.  ? ?Interventions: ?1:1 collaboration with Walter Ghent, MD regarding development and update of comprehensive plan of care as evidenced by provider attestation and co-signature ?Inter-disciplinary care team collaboration (see longitudinal plan of care) ?Comprehensive medication review performed; medication list updated in electronic medical record ? 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 ?Diabetes (A1c goal <7%) ?-Query controlled - A1c 9.3% (05/2021), however fructosamine 386 ~correlates to A1c 8.2%; pt was recently prescribed Dexcom but has not picked it up yet ?-Follows with Endocrine (Walter Harris); next appt  12/06/21 ?-Current home glucose readings ?fasting glucose: none reported; "stable" per wife ?-Current medications: ?Glipizide 10 mg XL daily - Appropriate, Query Effective ?Lantus Solostar 24 units daily -Appropriate, Query Effective ?Metformin ER 500 mg TID -Appropriate, Query Effective ?Dexcom G6 ?-Medications previously tried: glimepiride, Invokana (edema), pioglitazone ?-Discussed Dexcom - pt plans to ask pharmacist at [facility] how to use it; offered my assistance as well ?-Recommended to continue current medication; keep appt with endocrine ? ?Hyperlipidemia: (LDL goal < 70) ?-Not ideally controlled - LDL 121 (11/2020), pt has not tolerated multiple statins include low dose rosuvastatin; he has also failed zetia  ?-Hx CVA; statin intolerant ?-Current treatment: ?None ?-Medications previously tried: pravastatin, rosuvastatin (BIW), atorvastatin, ezetimibe ?-Educated on Cholesterol goals; Importance of limiting foods high in cholesterol; ?-Pt may be a candidate for Nexletol or PCSK9-inhibitor; will reassess after repeat lipid panel ?-Recommend repeat lipid panel; pt is due PCP visit ? ?Anxiety / Panic (Goal: manage symptoms) ?-Controlled ?-PHQ9: 0 (10/2021) - minimal depression ?-GAD7: not on file ?-Connected with PCP for mental health support ?-Current treatment: ?Fluoxetine 10 mg - 3 cap daily - Appropriate, Effective, Safe, Accessible ?Alprazolam 0.5 mg BID -Appropriate, Effective, Query Safe ?Trazodone 50 mg daily -Appropriate, Effective, Safe, Accessible ?-Medications previously tried/failed: n/a ?-Educated on Benefits of medication for symptom control ?-Previously discussed sedation/fall risk with alprazolam especially in combination with oxycodone; pt denies oversedation or overuse of medications in general ?-Recommended to continue current medication ? ?Essential tremor (Goal: manage symptoms) ?-Controlled ?-Current treatment  ?Propranolol ER 60 mg daily - Appropriate, Effective, Safe,  Accessible ?Primidone 50 mg daily - Appropriate, Effective, Safe, Accessible ?-Medications previously tried: n/a  ?-Recommended to continue current medication ? ?Patient Goals/Self-Care Activities ?Patient will:  ?- check blood pressure with any dizziness, document, and provide at future appointments ?-Start using Dexcom for  monitoring blood sugars ?-contact office with any health concerns ?  ?  ? ?Patient verbalizes understanding of instructions and care plan provided today and agrees to view in [place]. Active MyChart status confirmed with patient.   ?Telephone follow up appointment with pharmacy team member scheduled for: 6 months ? ?Walter Harris, PharmD, BCACP ?Clinical Pharmacist ?[facility] ?[phone] ?  ?

## 2021-11-29 NOTE — Telephone Encounter (Addendum)
Patient will be due for AWV this summer; please call to schedule patient not mychart as patient does not get on mychart.  ?

## 2021-11-29 NOTE — Progress Notes (Signed)
? ?Chronic Care Management ?Pharmacy Note ? ?11/29/2021 ?Name:  Walter Harris MRN:  [id number] DOB:  1945/10/23 ? ?Summary: CCM F/U visit ?-A1c 9.3% (05/2021), however fructosamine 386 equivalent to A1c 8.2%; Pt will be picking up Dexcom this week; currently pt reports BG are "stable" but does not have readings available. He follows up with endocrine 12/06/21 ?-LDL 121 (11/2020); LDL goal < 70 given history of CVA; pt is intolerant to statins/zetia; he would be a candidate for PCSK9-inhibitor or Nexletol ([place] available for cost issues) ? ?Recommendations/Changes made from today's visit: ?-No med changes ?-Recommend repeat lipid panel; consider statin alternatives if warranted ? ?Plan: ?-[facility] will call patient 3 months for HLD update ?-Pharmacist follow up televisit scheduled for 6 months ?-PCP annual visit due (no show 11/12/21) - pt to schedule ? ? ? ?Subjective: ?Walter Harris is an 76 y.o. year old male who is a primary patient of [name], Elveria Rising, MD.  The CCM team was consulted for assistance with disease management and care coordination needs.   ? ?Engaged with patient by telephone for follow up visit in response to provider referral for pharmacy case management and/or care coordination services.  ? ?Consent to Services:  ?The patient was given information about Chronic Care Management services, agreed to services, and gave verbal consent prior to initiation of services.  Please see initial visit note for detailed documentation.  ? ?Patient Care Team: ?[name], MD as PCP - General (Family Medicine) ?Kary Kos, MD as Consulting Physician (Neurosurgery) ?Estill Cotta, MD as Referring Physician (Ophthalmology) ?Oneta Rack, MD as Referring Physician (Dermatology) ?Kary Kos, MD as Consulting Physician (Neurosurgery) ?Dmari Schubring, Cleaster Corin, [facility] as Pharmacist (Pharmacist) ? ?Recent office visits: ?02/21/21 - PCP - Patient call, Hold Invokana x 10 days to see if swelling  improves ?02/20/21 - PCP - Labs ordered no medication changes ? ?Recent consult visits: ?06/06/21 Dr Honor Junes (Endocrine): f/u DM. Hold Invokana. A1c 9.3%, frucostamine 386 ? ?Hospital visits: ?None in previous 6 months ? ? ?Objective: ? ?Lab Results  ?Component Value Date  ? CREATININE 1.52 (H) 03/05/2021  ? BUN 24 (H) 03/05/2021  ? GFR 44.94 (L) 03/05/2021  ? GFRNONAA >60 12/07/2019  ? GFRAA >60 12/07/2019  ? NA 136 03/05/2021  ? K 4.4 03/05/2021  ? CALCIUM 9.1 03/05/2021  ? CO2 24 03/05/2021  ? GLUCOSE 244 (H) 03/05/2021  ? ? ?Lab Results  ?Component Value Date/Time  ? HGBA1C 9.2 (H) 11/10/2020 09:34 AM  ? HGBA1C 9.0 03/30/2020 12:00 AM  ? HGBA1C 9.6 (H) 12/07/2019 04:29 AM  ? GFR 44.94 (L) 03/05/2021 09:17 AM  ? GFR 45.31 (L) 02/20/2021 11:33 AM  ? MICROALBUR 5.6 (H) 02/28/2015 09:19 AM  ? MICROALBUR 5.9 (H) 02/19/2010 03:44 PM  ?  ?Last diabetic Eye exam:  ?Lab Results  ?Component Value Date/Time  ? HMDIABEYEEXA No Retinopathy 07/11/2021 12:00 AM  ?  ?Last diabetic Foot exam:  ?Lab Results  ?Component Value Date/Time  ? HMDIABFOOTEX done 03/19/2016 12:00 AM  ?  ? ?Lab Results  ?Component Value Date  ? CHOL 199 11/10/2020  ? HDL 39.30 11/10/2020  ? LDLCALC 121 (H) 11/10/2020  ? LDLDIRECT 160.2 06/01/2012  ? TRIG 195.0 (H) 11/10/2020  ? CHOLHDL 5 11/10/2020  ? ? ? ?  Latest Ref Rng & Units 02/20/2021  ? 11:33 AM 11/10/2020  ?  9:34 AM 12/07/2019  ?  4:29 AM  ?Hepatic Function  ?Total Protein 6.0 - 8.3 g/dL 7.0  6.7   6.5    ?Albumin 3.5 - 5.2 g/dL 4.3   4.2   3.6    ?AST 0 - 37 U/L 15   18   19     ?ALT 0 - 53 U/L 17   19   20     ?Alk Phosphatase 39 - 117 U/L 60   58   58    ?Total Bilirubin 0.2 - 1.2 mg/dL 0.7   0.8   0.9    ? ? ?Lab Results  ?Component Value Date/Time  ? TSH 1.46 11/10/2020 09:34 AM  ? TSH 1.59 09/28/2018 10:39 AM  ? ? ? ?  Latest Ref Rng & Units 02/20/2021  ? 11:33 AM 11/10/2020  ?  9:34 AM 12/07/2019  ?  4:29 AM  ?CBC  ?WBC 4.0 - 10.5 K/uL 5.8   6.3   6.0    ?Hemoglobin 13.0 - 17.0 g/dL 13.6   13.8    14.8    ?Hematocrit 39.0 - 52.0 % 41.7   41.4   43.5    ?Platelets 150.0 - 400.0 K/uL 231.0   226.0   181    ? ? ?Lab Results  ?Component Value Date/Time  ? VD25OH 31 03/24/2009 09:37 PM  ? ? ?Clinical ASCVD: Yes  ?The ASCVD Risk score (Arnett DK, et al., 2019) failed to calculate for the following reasons: ?  The patient has a prior MI or stroke diagnosis   ? ? ?  10/31/2021  ?  9:52 AM 10/27/2020  ?  9:53 AM 10/01/2019  ?  3:35 PM  ?Depression screen PHQ 2/9  ?Decreased Interest 0 0 0  ?Down, Depressed, Hopeless 0 0 0  ?PHQ - 2 Score 0 0 0  ?Altered sleeping  0 0  ?Tired, decreased energy  0 0  ?Change in appetite  0 0  ?Feeling bad or failure about yourself   0 0  ?Trouble concentrating  0 0  ?Moving slowly or fidgety/restless  0 0  ?Suicidal thoughts  0 0  ?PHQ-9 Score  0 0  ?Difficult doing work/chores  Not difficult at all Not difficult at all  ?  ? ? ?Social History  ? ?Tobacco Use  ?Smoking Status Former  ? Packs/day: 0.50  ? Years: 40.00  ? Pack years: 20.00  ? Types: Pipe, Cigarettes  ? Quit date: 06/09/2016  ? Years since quitting: 5.4  ?Smokeless Tobacco Never  ? ?BP Readings from Last 3 Encounters:  ?02/20/21 128/76  ?11/10/20 122/68  ?08/08/20 140/88  ? ?Pulse Readings from Last 3 Encounters:  ?02/20/21 79  ?11/10/20 76  ?08/08/20 80  ? ?Wt Readings from Last 3 Encounters:  ?10/31/21 191 lb (86.6 kg)  ?02/20/21 191 lb (86.6 kg)  ?11/10/20 188 lb (85.3 kg)  ? ?BMI Readings from Last 3 Encounters:  ?10/31/21 29.04 kg/m?  ?02/20/21 29.04 kg/m?  ?11/10/20 28.59 kg/m?  ? ? ?Assessment/Interventions: Review of patient past medical history, allergies, medications, health status, including review of consultants reports, laboratory and other test data, was performed as part of comprehensive evaluation and provision of chronic care management services.  ? ?SDOH:  (Social Determinants of Health) assessments and interventions performed: No - recently performed 10/2021 AWV ? ?SDOH Screenings  ? ?Alcohol Screen: Low Risk    ? Last Alcohol Screening Score (AUDIT): 0  ?Depression (PHQ2-9): Low Risk   ? PHQ-2 Score: 0  ?Financial Resource Strain: Low Risk   ? Difficulty of Paying Living Expenses: Not hard at all  ?  Food Insecurity: No Food Insecurity  ? Worried About Charity fundraiser in the Last Year: Never true  ? Ran Out of Food in the Last Year: Never true  ?Housing: Low Risk   ? Last Housing Risk Score: 0  ?Physical Activity: Inactive  ? Days of Exercise per Week: 0 days  ? Minutes of Exercise per Session: 0 min  ?Social Connections: Socially Isolated  ? Frequency of Communication with Friends and Family: Once a week  ? Frequency of Social Gatherings with Friends and Family: Never  ? Attends Religious Services: Never  ? Active Member of Clubs or Organizations: No  ? Attends Archivist Meetings: Never  ? Marital Status: Married  ?Stress: No Stress Concern Present  ? Feeling of Stress : Not at all  ?Tobacco Use: Medium Risk  ? Smoking Tobacco Use: Former  ? Smokeless Tobacco Use: Never  ? Passive Exposure: Not on file  ?Transportation Needs: No Transportation Needs  ? Lack of Transportation (Medical): No  ? Lack of Transportation (Non-Medical): No  ? ? ?CCM Care Plan ? ?Allergies  ?Allergen Reactions  ? Bee Venom Anaphylaxis  ? Pravastatin Other (See Comments)  ?  Severe myalgias  ? Ace Inhibitors   ?  Cough  ? Actos [Pioglitazone] Other (See Comments)  ?  Intolerant, not allergic.   ? Angiotensin Receptor Blockers Other (See Comments)  ?  cramps  ? Aripiprazole Other (See Comments)  ?  Unknown reaction many years ago  ? Crestor [Rosuvastatin Calcium] Other (See Comments)  ?  Aches, even with twice weekly dosing  ? Invokana [Canagliflozin]   ?  Edema  ? Metformin And Related Other (See Comments)  ?  Able to tolerate XR formulation, not allergic.   ? Nsaids   ?  Would avoid given creatinine elevation.  ? Olanzapine Other (See Comments)  ?  Unknown reaction many years ago  ? Pneumovax 23 [Pneumococcal Vac Polyvalent] Other  (See Comments)  ?  aches  ? Codeine Rash  ? Divalproex Sodium Other (See Comments)  ?   tremor  ? Lithium Carbonate Other (See Comments)  ?  Tremors ?  ? Sulfamethoxazole-Trimethoprim Other (See Comme

## 2021-11-30 NOTE — Telephone Encounter (Signed)
Patient called and appointment has been made no further action needed.  ?

## 2021-12-06 DIAGNOSIS — E1165 Type 2 diabetes mellitus with hyperglycemia: Secondary | ICD-10-CM | POA: Diagnosis not present

## 2021-12-06 DIAGNOSIS — E1169 Type 2 diabetes mellitus with other specified complication: Secondary | ICD-10-CM | POA: Diagnosis not present

## 2021-12-06 DIAGNOSIS — Z794 Long term (current) use of insulin: Secondary | ICD-10-CM | POA: Diagnosis not present

## 2021-12-06 DIAGNOSIS — E785 Hyperlipidemia, unspecified: Secondary | ICD-10-CM | POA: Diagnosis not present

## 2021-12-06 LAB — LIPID PANEL
Cholesterol: 184 (ref 0–200)
HDL: 40 (ref 35–70)
LDL Cholesterol: 118
Triglycerides: 133 (ref 40–160)

## 2021-12-06 LAB — BASIC METABOLIC PANEL: Creatinine: 1.3 (ref <=1.3)

## 2021-12-06 LAB — HEMOGLOBIN A1C: Hemoglobin A1C: 9

## 2021-12-06 NOTE — Telephone Encounter (Signed)
Patient scheduled for 01/08/22.

## 2021-12-07 DIAGNOSIS — Z87891 Personal history of nicotine dependence: Secondary | ICD-10-CM | POA: Diagnosis not present

## 2021-12-07 DIAGNOSIS — I10 Essential (primary) hypertension: Secondary | ICD-10-CM

## 2021-12-07 DIAGNOSIS — E785 Hyperlipidemia, unspecified: Secondary | ICD-10-CM | POA: Diagnosis not present

## 2021-12-07 DIAGNOSIS — E1165 Type 2 diabetes mellitus with hyperglycemia: Secondary | ICD-10-CM | POA: Diagnosis not present

## 2021-12-07 DIAGNOSIS — Z794 Long term (current) use of insulin: Secondary | ICD-10-CM

## 2021-12-07 DIAGNOSIS — Z7984 Long term (current) use of oral hypoglycemic drugs: Secondary | ICD-10-CM | POA: Diagnosis not present

## 2021-12-07 DIAGNOSIS — E1169 Type 2 diabetes mellitus with other specified complication: Secondary | ICD-10-CM | POA: Diagnosis not present

## 2021-12-12 ENCOUNTER — Telehealth: Payer: Self-pay | Admitting: Family Medicine

## 2021-12-12 NOTE — Telephone Encounter (Signed)
Pt wife called asking if Dr [name] can up the dosage on medication traZODone (DESYREL) 50 MG tablet 2 pills at night instead of 1 pill. Pt wife is also asking if Dr [name] can call in prescription oxyCODONE-acetaminophen (PERCOCET/ROXICET) 5-325 MG tablet and have it on hold at the pharmacy just so she want have to keep calling. Please advise. ?

## 2021-12-12 NOTE — Telephone Encounter (Signed)
Left message to return call to our office for [place] on dpr. To get more information on medication change request.  ? ?

## 2021-12-13 MED ORDER — OXYCODONE-ACETAMINOPHEN 5-325 MG PO TABS: ORAL_TABLET | ORAL | 0 refills | Status: DC

## 2021-12-13 MED ORDER — TRAZODONE HCL 50 MG PO TABS: 75.0000 mg | ORAL_TABLET | Freq: Every day | ORAL | 1 refills | Status: DC

## 2021-12-13 NOTE — Telephone Encounter (Signed)
Rx sent for oxycodone and trazodone.  Pharmacy should be able to hold rxs for now.  He had most recent oxycodone rx filled 11/29/21. Thanks.   ?

## 2021-12-13 NOTE — Addendum Note (Signed)
Addended by: [name] on: 12/13/2021 04:36 PM ? ? Modules accepted: Orders ? ?

## 2021-12-13 NOTE — Telephone Encounter (Signed)
Patient just wants to be able to take one pill of trazodone instead of 2 pills if possible. And wants refill of the oxycodone to have on hand at pharmacy so they do not have to kep calling back. Wait times are too long and sometimes can not get thru.  ?

## 2021-12-13 NOTE — Telephone Encounter (Signed)
[facility] Night - Client ?Nonclinical Telephone Record  ?[facility]? ?Client [facility] Night - Client ?Client Site [place] ?Provider Renford Dills - MD ?Contact Type Call ?Who Is Calling Patient / Member / Family / Caregiver ?Caller Name Walter Harris ?Caller Phone Number [phone] ?Call Type Message Only Information Provided ?Reason for Call Returning a Call from the Office ?Initial Comment Caller states is returning a phone ago. ?Additional Comment Provided office hours. ?Disp. Time Disposition Final User ?12/12/2021 5:09:11 PM General Information Provided Yes [name] [name] ?Call Closed By: Baker Janus ?Transaction Date/Time: 12/12/2021 5:06:12 PM (ET ?

## 2021-12-13 NOTE — Telephone Encounter (Signed)
Patients wife notified rxs were sent. ?

## 2021-12-17 ENCOUNTER — Other Ambulatory Visit: Payer: Self-pay | Admitting: Family Medicine

## 2021-12-28 ENCOUNTER — Other Ambulatory Visit: Payer: Self-pay | Admitting: Family Medicine

## 2021-12-28 NOTE — Telephone Encounter (Signed)
Refill request Zanaflex ?Last refill 05/31/21 #120/2 ?Last office visit 02/20/21 ?Upcoming appointment 01/08/22 ?

## 2022-01-08 ENCOUNTER — Encounter: Payer: Self-pay | Admitting: Family Medicine

## 2022-01-08 ENCOUNTER — Ambulatory Visit (INDEPENDENT_AMBULATORY_CARE_PROVIDER_SITE_OTHER): Payer: PPO | Admitting: Family Medicine

## 2022-01-08 VITALS — BP 142/84 | HR 71 | Temp 97.9°F | Ht 68.0 in | Wt 189.0 lb

## 2022-01-08 DIAGNOSIS — G8929 Other chronic pain: Secondary | ICD-10-CM | POA: Diagnosis not present

## 2022-01-08 DIAGNOSIS — G4700 Insomnia, unspecified: Secondary | ICD-10-CM

## 2022-01-08 DIAGNOSIS — M549 Dorsalgia, unspecified: Secondary | ICD-10-CM | POA: Diagnosis not present

## 2022-01-08 DIAGNOSIS — Z0000 Encounter for general adult medical examination without abnormal findings: Secondary | ICD-10-CM

## 2022-01-08 DIAGNOSIS — F411 Generalized anxiety disorder: Secondary | ICD-10-CM

## 2022-01-08 DIAGNOSIS — R0989 Other specified symptoms and signs involving the circulatory and respiratory systems: Secondary | ICD-10-CM

## 2022-01-08 DIAGNOSIS — G250 Essential tremor: Secondary | ICD-10-CM

## 2022-01-08 DIAGNOSIS — R269 Unspecified abnormalities of gait and mobility: Secondary | ICD-10-CM

## 2022-01-08 DIAGNOSIS — E1165 Type 2 diabetes mellitus with hyperglycemia: Secondary | ICD-10-CM

## 2022-01-08 DIAGNOSIS — Z7189 Other specified counseling: Secondary | ICD-10-CM

## 2022-01-08 MED ORDER — LANTUS SOLOSTAR 100 UNIT/ML ~~LOC~~ SOPN: 22.0000 [IU] | PEN_INJECTOR | Freq: Every day | SUBCUTANEOUS | Status: DC

## 2022-01-08 NOTE — Progress Notes (Signed)
Mood d/w pt.  Using BZD at baseline.  Taking prozac '30mg'$  a day.  Mood is okay, "doing fine."   ? ?DM.  Per endocrine.  I'll defer.  He agrees.   ? ?Hypertension:    ?Using medication without problems or lightheadedness: yes ?Chest pain with exertion:no ?Edema:no ?Short of breath:no ?Recent labs from endocrine d/w pt.   ?Statin intolerant.   ? ?Tremor.  Still on primidone and beta blocker.  He is eating better now, with less tremor.  Controlled reasonably well as is.  No recent falls.   ? ?Chronic pain.  Still on oxycodone at baseline.  No ADE on med.  Not drowsy from pain medicine. No constipation.  He is still in pain but is it more mangeable with meds.   fall cautions d/w pt.  Using wheelchair at [place] due to baseline gait abnormality due to pain.  We talked about PT.  Referral placed. ? ?Insomnia.  Sleeping better with trazodone.  No ADE on med.   ? ?Tetanus 2018.   ?Flu prev done.  ?Shingles prev done.    ?PNA up to date.   ?Covid vaccine prev done.   ?Advance directive- wife designated if patient were incapacitated. ?Colonoscopy 2013, defer given age 37, d/w pt.   ?Prostate cancer screening and PSA options (with potential risks and benefits of testing vs not testing) were discussed along with recent recs/guidelines.  He declined testing PSA at this point.   ? ?Meds, vitals, and allergies reviewed.  ? ?PMH and SH reviewed ? ?ROS: Per HPI unless specifically indicated in ROS section  ? ?GEN: nad, alert and oriented ?HEENT: ncat ?NECK: supple w/o LA ?CV: rrr. ?PULM: ctab, no inc wob ?ABD: soft, +bs ?EXT: no edema ?SKIN: no acute rash ? ?Diabetic foot exam: ?Normal inspection ?No skin breakdown ?No calluses  ?Intact but dec DP pulses ?Normal sensation to light touch and monofilament ?Nails normal ?

## 2022-01-08 NOTE — Patient Instructions (Signed)
We'll call about PT and the ultrasound on your legs (to check your circulation). ?I'll work on your refills.   ?Don't change your meds for now.  ?Take care.  Glad to see you. ?

## 2022-01-09 DIAGNOSIS — R0989 Other specified symptoms and signs involving the circulatory and respiratory systems: Secondary | ICD-10-CM | POA: Insufficient documentation

## 2022-01-09 DIAGNOSIS — G4700 Insomnia, unspecified: Secondary | ICD-10-CM | POA: Insufficient documentation

## 2022-01-09 MED ORDER — FLUOXETINE HCL 10 MG PO CAPS: ORAL_CAPSULE | ORAL | 3 refills | Status: DC

## 2022-01-09 MED ORDER — ALPRAZOLAM 0.5 MG PO TABS: ORAL_TABLET | ORAL | 2 refills | Status: DC

## 2022-01-09 NOTE — Assessment & Plan Note (Signed)
Improved with trazodone.  No adverse effect.  Continue as is. ?

## 2022-01-09 NOTE — Assessment & Plan Note (Signed)
Would continue Prozac with Xanax as needed.  Not sedated.  Mood is good.  He agrees. ?

## 2022-01-09 NOTE — Assessment & Plan Note (Signed)
Refer for lower extremity arterial duplex.  No skin breakdown.  No ulceration.  Okay for outpatient follow-up. ?

## 2022-01-09 NOTE — Assessment & Plan Note (Signed)
Still on primidone and beta blocker.  He is eating better now, with less tremor.  Controlled reasonably well as is.  No recent falls.  Continue primidone and beta-blocker. ?

## 2022-01-09 NOTE — Assessment & Plan Note (Signed)
Per endocrinology.  I will defer. 

## 2022-01-09 NOTE — Assessment & Plan Note (Signed)
Advance directive- wife designated if patient were incapacitated.  

## 2022-01-09 NOTE — Assessment & Plan Note (Addendum)
Still on oxycodone at baseline.  No ADE on med.  Not drowsy from pain medicine. No constipation.  He is still in pain but is it more mangeable with meds.   fall cautions d/w pt.  Using wheelchair at [place].  We talked about PT.  Referral placed.  Would continue oxycodone as is. ?

## 2022-01-09 NOTE — Assessment & Plan Note (Signed)
Tetanus 2018.   ?Flu prev done.  ?Shingles prev done.    ?PNA up to date.   ?Covid vaccine prev done.   ?Advance directive- wife designated if patient were incapacitated. ?Colonoscopy 2013, defer given age 76, d/w pt.   ?Prostate cancer screening and PSA options (with potential risks and benefits of testing vs not testing) were discussed along with recent recs/guidelines.  He declined testing PSA at this point.   ?

## 2022-01-14 ENCOUNTER — Telehealth: Payer: PPO

## 2022-02-15 ENCOUNTER — Telehealth: Payer: Self-pay

## 2022-02-15 NOTE — Chronic Care Management (AMB) (Signed)
Chronic Care Management Pharmacy Assistant   Name: Walter Harris  MRN: [id number] DOB: 10-19-1945   Reason for Encounter:  Diabetes Disease State  Recent office visits:  01/08/22-Graham Duncan,MD(PCP)- AWV,referral for PT,discussed vaccines,screenings,referral for arterial duplex,no medication changes  Recent consult visits:  12/06/21-Thomas O'Connell,MD(endo) A1c 9.0, encouraged lifestyle modifications,no medication changes  Hospital visits:  None in previous 6 months  Medications: Outpatient Encounter Medications as of 02/15/2022  Medication Sig   ALPRAZolam (XANAX) 0.5 MG tablet TAKE ONE (1) TABLET BY MOUTH TWO TIMES PER DAY WITH AN EXTRA 1/2 TABLET IF NEEDED. SEDATION CAUTION   aspirin 81 MG EC tablet Take 1 tablet (81 mg total) by mouth daily. For stroke prevention   BD PEN NEEDLE NANO U/F 32G X 4 MM MISC USE AS DIRECTED   clopidogrel (PLAVIX) 75 MG tablet TAKE 1 TABLET BY MOUTH DAILY   FLUoxetine (PROZAC) 10 MG capsule TAKE 3 CAPSULES BY MOUTH DAILY FOR MOOD AS DIRECTED   glipiZIDE (GLUCOTROL XL) 10 MG 24 hr tablet Take 1 tablet (10 mg total) by mouth daily with breakfast. For diabetes   insulin glargine (LANTUS SOLOSTAR) 100 UNIT/ML Solostar Pen Inject 22 Units into the skin daily. For diabetes   irbesartan (AVAPRO) 150 MG tablet Take 1 tablet (150 mg total) by mouth daily. For blood pressure   ketoconazole (NIZORAL) 2 % cream Apply 1 application topically daily.   metFORMIN (GLUCOPHAGE-XR) 500 MG 24 hr tablet TAKE ONE TABLET 3 TIMES DAILY for diabetes   oxyCODONE-acetaminophen (PERCOCET/ROXICET) 5-325 MG tablet TAKE ONE TO TWO TABLETS BY MOUTH 3 TIMES DAILY IF NEEDED FOR PAIN. Fill on/after 01/27/2022   primidone (MYSOLINE) 50 MG tablet TAKE 1 TABLET BY MOUTH DAILY   propranolol ER (INDERAL LA) 60 MG 24 hr capsule TAKE 1 CAPSULE EVERY DAY   tiZANidine (ZANAFLEX) 4 MG tablet TAKE ONE TABLET 3 TIMES DAILY AS NEEDED FOR MUSCLE SPASM MAY TAKE 1 EXTRADOSE DAILY AS NEEDED    traZODone (DESYREL) 50 MG tablet Take 1.5-2 tablets (75-100 mg total) by mouth at bedtime.   No facility-administered encounter medications on file as of 02/15/2022.     Recent Relevant Labs: Lab Results  Component Value Date/Time   HGBA1C 9.0 12/06/2021 12:00 AM   HGBA1C 9.2 (H) 11/10/2020 09:34 AM   HGBA1C 9.0 03/30/2020 12:00 AM   MICROALBUR 5.6 (H) 02/28/2015 09:19 AM   MICROALBUR 5.9 (H) 02/19/2010 03:44 PM    Kidney Function Lab Results  Component Value Date/Time   CREATININE 1.3 12/06/2021 12:00 AM   CREATININE 1.52 (H) 03/05/2021 09:17 AM   CREATININE 1.51 (H) 02/20/2021 11:33 AM   CREATININE 1.1 09/29/2019 12:00 AM   CREATININE 1.20 (H) 02/13/2018 03:20 PM   CREATININE 1.01 09/13/2013 03:25 PM   GFR 44.94 (L) 03/05/2021 09:17 AM   GFRNONAA >60 12/07/2019 04:29 AM   GFRNONAA >60 09/13/2013 03:25 PM   GFRAA >60 12/07/2019 04:29 AM   GFRAA >60 09/13/2013 03:25 PM     Contacted patient on 02/20/22 to discuss diabetes disease state.   Current antihyperglycemic regimen:  Glipizide 10 mg Xl take 1 tablet  daily  Lantus Solostar 24 units daily - patient taking (20) units Metformin ER 500 mg TID  Dexcom G6   Patient verbally confirms he is taking the above medications as directed. Yes  What diet changes have been made to improve diabetes control? Maintains healthy choices with meals  What recent interventions/DTPs have been made to improve glycemic control:  Recommend repeat lipid panel;  consider statin alternatives if warranted (12/06/21)chart  Have there been any recent hospitalizations or ED visits since last visit with CPP? No  Patient denies hypoglycemic symptoms, including Pale, Sweaty, Shaky, Hungry, Nervous/irritable, and Vision changes  Patient denies hyperglycemic symptoms, including blurry vision, excessive thirst, fatigue, polyuria, and weakness  How often are you checking your blood sugar? once daily  What are your blood sugars ranging?  Fasting:  from  02/10/2022 125,132,119,114,123/131,127,138,129,121   During the week, how often does your blood glucose drop below 70? Never  Are you checking your feet daily/regularly? Yes  Adherence Review: Is the patient currently on a STATIN medication? No Is the patient currently on ACE/ARB medication? Yes Does the patient have >5 day gap between last estimated fill dates? No  Care Gaps: Annual wellness visit in last year? Yes Most recent A1C reading:9.0  12/06/21 Most Recent BP reading:142/84  01/08/22  Last eye exam / retinopathy screening:UTD  Last diabetic foot exam:UTD  Counseled patient on importance of annual eye and foot exam. UTD  Star Rating Drugs:  Medication:  Last Fill: Day Supply Glipizide '10mg'$  01/15/22  90 Metformin xr '500mg'$  01/09/22  90 Irbesartan '150mg'$  02/08/22  30 Lantus   12/14/21  30    CCM appointment on 05/27/22  Charlene Brooke, CPP notified  Avel Sensor, [place]  [phone]

## 2022-03-05 ENCOUNTER — Other Ambulatory Visit: Payer: Self-pay | Admitting: Family Medicine

## 2022-03-05 NOTE — Telephone Encounter (Signed)
[facility] and [facility] filled the oxycodone today. They sent over the refill request for future refills.

## 2022-03-06 MED ORDER — OXYCODONE-ACETAMINOPHEN 5-325 MG PO TABS: ORAL_TABLET | ORAL | 0 refills | Status: DC

## 2022-03-06 NOTE — Telephone Encounter (Signed)
Noted.  Sent.  Thanks. 

## 2022-03-15 ENCOUNTER — Other Ambulatory Visit: Payer: Self-pay | Admitting: Family Medicine

## 2022-04-02 ENCOUNTER — Other Ambulatory Visit: Payer: Self-pay | Admitting: Family Medicine

## 2022-04-02 NOTE — Telephone Encounter (Signed)
Refill request for ALPRAZOLAM 0.5 MG TAB  LOV - 01/08/22 Next OV - not scheduled Last refill - 01/09/22 #75/2

## 2022-04-03 DIAGNOSIS — M720 Palmar fascial fibromatosis [Dupuytren]: Secondary | ICD-10-CM | POA: Diagnosis not present

## 2022-04-09 ENCOUNTER — Other Ambulatory Visit: Payer: Self-pay | Admitting: Surgery

## 2022-04-11 ENCOUNTER — Telehealth: Payer: Self-pay | Admitting: Family Medicine

## 2022-04-11 ENCOUNTER — Encounter
Admission: RE | Admit: 2022-04-11 | Discharge: 2022-04-11 | Disposition: A | Discharge status: Home / Self Care | Payer: PPO | Source: Ambulatory Visit | Attending: Surgery | Admitting: Surgery

## 2022-04-11 ENCOUNTER — Encounter: Payer: Self-pay | Admitting: Urgent Care

## 2022-04-11 VITALS — Ht 68.0 in | Wt 195.0 lb

## 2022-04-11 DIAGNOSIS — Z01818 Encounter for other preprocedural examination: Secondary | ICD-10-CM | POA: Insufficient documentation

## 2022-04-11 DIAGNOSIS — E1165 Type 2 diabetes mellitus with hyperglycemia: Secondary | ICD-10-CM

## 2022-04-11 DIAGNOSIS — Z8673 Personal history of transient ischemic attack (TIA), and cerebral infarction without residual deficits: Secondary | ICD-10-CM | POA: Diagnosis not present

## 2022-04-11 DIAGNOSIS — Z01812 Encounter for preprocedural laboratory examination: Secondary | ICD-10-CM

## 2022-04-11 DIAGNOSIS — J310 Chronic rhinitis: Secondary | ICD-10-CM

## 2022-04-11 DIAGNOSIS — I10 Essential (primary) hypertension: Secondary | ICD-10-CM | POA: Insufficient documentation

## 2022-04-11 DIAGNOSIS — G250 Essential tremor: Secondary | ICD-10-CM

## 2022-04-11 HISTORY — DX: Personal history of urinary calculi: Z87.442

## 2022-04-11 LAB — BASIC METABOLIC PANEL
Anion gap: 9 (ref 5–15)
BUN: 14 mg/dL (ref 8–23)
CO2: 25 mmol/L (ref 22–32)
Calcium: 8.8 mg/dL — ABNORMAL LOW (ref 8.9–10.3)
Chloride: 104 mmol/L (ref 98–111)
Creatinine, Ser: 1.2 mg/dL (ref 0.61–1.24)
GFR, Estimated: >60 mL/min (ref >60)
Glucose, Bld: 189 mg/dL — ABNORMAL HIGH (ref 70–99)
Potassium: 4.2 mmol/L (ref 3.5–5.1)
Sodium: 138 mmol/L (ref 135–145)

## 2022-04-11 LAB — CBC
HCT: 37.7 % — ABNORMAL LOW (ref 39.0–52.0)
Hemoglobin: 12.4 g/dL — ABNORMAL LOW (ref 13.0–17.0)
MCH: 30 pg (ref 26.0–34.0)
MCHC: 32.9 g/dL (ref 30.0–36.0)
MCV: 91.1 fL (ref 80.0–100.0)
Platelets: 213 10*3/uL (ref 150–400)
RBC: 4.14 MIL/uL — ABNORMAL LOW (ref 4.22–5.81)
RDW: 12.7 % (ref 11.5–15.5)
WBC: 6.5 10*3/uL (ref 4.0–10.5)
nRBC: 0 % (ref 0.0–0.2)

## 2022-04-11 NOTE — Telephone Encounter (Signed)
Langley Gauss called in from [facility] and stated that she faxed in a form asking would if its okay for the patient to stop taking aspirin and clavix five days before his surgery? His surgery is in 5 days?

## 2022-04-11 NOTE — Telephone Encounter (Signed)
I called and spoke with Walter Harris at [facility]. She faxed the form over this morning but I have not received it off the fax drive yet. I asked her if she can take a verbal on this and she said that she could. I let her know I would send the message over and call her back when I got a response.

## 2022-04-11 NOTE — Telephone Encounter (Signed)
Denise from [facility] called in again on behalf of patient and the form that was faxed over,and would like for you to call her.

## 2022-04-11 NOTE — Patient Instructions (Addendum)
Your procedure is scheduled on: Tuesday, August 8 Report to the Registration Desk on the 1st floor of the [facility]. To find out your arrival time, please call [phone] between 1PM - 3PM on: Monday, August 7 If your arrival time is 6:00 am, do not arrive prior to that time as the [place] entrance doors do not open until 6:00 am.  REMEMBER: Instructions that are not followed completely may result in serious medical risk, up to and including death; or upon the discretion of your surgeon and anesthesiologist your surgery may need to be rescheduled.  Do not eat food after midnight the night before surgery.  No gum chewing, lozengers or hard candies.  You may however, drink water up to 2 hours before you are scheduled to arrive for your surgery. Do not drink anything within 2 hours of your scheduled arrival time.  In addition, your doctor has ordered for you to drink the provided  Gatorade G2 Drinking this carbohydrate drink up to two hours before surgery helps to reduce insulin resistance and improve patient outcomes. Please complete drinking 2 hours prior to scheduled arrival time.  TAKE THESE MEDICATIONS THE MORNING OF SURGERY WITH A SIP OF WATER:  Alprazolam (xanax) if needed for anxiety Fluoxetine (Prozac) Oxycodone if needed for pain Primidone  Metformin - hold 2 days prior to surgery. Last day to TAKE is Saturday, August 5. Resume AFTER surgery.  Lantus - only take half (1/2) of your dose the night before surgery. Only take 10 units Monday, August 7.   Follow recommendations from Cardiologist, Pulmonologist or PCP regarding stopping Aspirin and Plavix. Stop taking the plavix today, August 3. Continue taking the aspirin.  Resume Plavix AFTER surgery per surgeon instruction.  One week prior to surgery: starting today, August 3 Stop Anti-inflammatories (NSAIDS) such as Advil, Aleve, Ibuprofen, Motrin, Naproxen, Naprosyn and Aspirin based products such as Excedrin, [place]  Powder, BC Powder. Stop ANY OVER THE COUNTER supplements until after surgery. You may however, continue to take Tylenol if needed for pain up until the day of surgery.  No Alcohol for 24 hours before or after surgery.  No Smoking including e-cigarettes for 24 hours prior to surgery.  No chewable tobacco products for at least 6 hours prior to surgery.  No nicotine patches on the day of surgery.  Do not use any "recreational" drugs for at least a week prior to your surgery.  Please be advised that the combination of cocaine and anesthesia may have negative outcomes, up to and including death. If you test positive for cocaine, your surgery will be cancelled.  On the morning of surgery brush your teeth with toothpaste and water, you may rinse your mouth with mouthwash if you wish. Do not swallow any toothpaste or mouthwash.  Use CHG Soap as directed on instruction sheet.  Do not wear jewelry, make-up, hairpins, clips or nail polish.  Do not wear lotions, powders, or perfumes.   Do not shave body from the neck down 48 hours prior to surgery just in case you cut yourself which could leave a site for infection.  Also, freshly shaved skin may become irritated if using the CHG soap.  Contact lenses, hearing aids and dentures may not be worn into surgery.  Do not bring valuables to the hospital. [facility] is not responsible for any missing/lost belongings or valuables.   Notify your doctor if there is any change in your medical condition (cold, fever, infection).  Wear comfortable clothing (specific to  your surgery type) to the hospital.  After surgery, you can help prevent lung complications by doing breathing exercises.  Take deep breaths and cough every 1-2 hours. Your doctor may order a device called an Incentive Spirometer to help you take deep breaths.  If you are being discharged the day of surgery, you will not be allowed to drive home. You will need a responsible adult (18  years or older) to drive you home and stay with you that night.   If you are taking public transportation, you will need to have a responsible adult (18 years or older) with you. Please confirm with your physician that it is acceptable to use public transportation.   Please call the [place] Dept. at [phone] if you have any questions about these instructions.  Surgery Visitation Policy:  Patients undergoing a surgery or procedure may have two family members or support persons with them as long as the person is not COVID-19 positive or experiencing its symptoms.   Preparing for Surgery with [place] (CHG) Soap    Before surgery, you can play an important role by reducing the number of germs on your skin.  CHG (Chlorhexidine gluconate) soap is an antiseptic cleanser which kills germs and bonds with the skin to continue killing germs even after washing.  Please do not use if you have an allergy to CHG or antibacterial soaps. If your skin becomes reddened/irritated stop using the CHG.  1. Shower the NIGHT BEFORE SURGERY and the MORNING OF SURGERY with CHG soap.  2. If you choose to wash your hair, wash your hair first as usual with your normal shampoo.  3. After shampooing, rinse your hair and body thoroughly to remove the shampoo.  4. Use CHG as you would any other liquid soap. You can apply CHG directly to the skin and wash gently with a scrungie or a clean washcloth.  5. Apply the CHG soap to your body only from the neck down. Do not use on open wounds or open sores. Avoid contact with your eyes, ears, mouth, and genitals (private parts). Wash face and genitals (private parts) with your normal soap.  6. Wash thoroughly, paying special attention to the area where your surgery will be performed.  7. Thoroughly rinse your body with warm water.  8. Do not shower/wash with your normal soap after using and rinsing off the CHG soap.  9. Pat yourself dry  with a clean towel.  10. Wear clean pajamas to bed the night before surgery.  12. Place clean sheets on your bed the night of your first shower and do not sleep with pets.  13. Shower again with the CHG soap on the day of surgery prior to arriving at the hospital.  14. Do not apply any deodorants/lotions/powders.  15. Please wear clean clothes to the hospital.

## 2022-04-12 NOTE — Telephone Encounter (Signed)
Form was received for this today. I called Langley Gauss back and advised it is fine for patient to come of requested medications for procedure. She does not need form back as verbal orders were fine.

## 2022-04-12 NOTE — Telephone Encounter (Signed)
Please give the order for [facility].    I do not recall any prior request regarding aspirin and Plavix for this procedure.  I have not talked to the patient about this specifically.  I am not in clinic today and I am unavailable next week.  If he has to come off the medications prior to the procedure then there is no choice and it makes sense to hold for 5 days.  I will defer to surgery.  Thanks.

## 2022-04-16 ENCOUNTER — Other Ambulatory Visit: Payer: Self-pay

## 2022-04-16 ENCOUNTER — Encounter
Admission: RE | Disposition: A | Discharge status: Home / Self Care | Payer: Self-pay | Source: Home / Self Care | Attending: Surgery

## 2022-04-16 ENCOUNTER — Ambulatory Visit: Payer: PPO | Admitting: Anesthesiology

## 2022-04-16 ENCOUNTER — Ambulatory Visit
Admission: RE | Admit: 2022-04-16 | Discharge: 2022-04-16 | Disposition: A | Discharge status: Home / Self Care | Payer: PPO | Attending: Surgery | Admitting: Surgery

## 2022-04-16 ENCOUNTER — Encounter: Payer: Self-pay | Admitting: Surgery

## 2022-04-16 DIAGNOSIS — E119 Type 2 diabetes mellitus without complications: Secondary | ICD-10-CM | POA: Diagnosis not present

## 2022-04-16 DIAGNOSIS — Z87891 Personal history of nicotine dependence: Secondary | ICD-10-CM | POA: Diagnosis not present

## 2022-04-16 DIAGNOSIS — I10 Essential (primary) hypertension: Secondary | ICD-10-CM | POA: Insufficient documentation

## 2022-04-16 DIAGNOSIS — M720 Palmar fascial fibromatosis [Dupuytren]: Secondary | ICD-10-CM | POA: Insufficient documentation

## 2022-04-16 DIAGNOSIS — Z8673 Personal history of transient ischemic attack (TIA), and cerebral infarction without residual deficits: Secondary | ICD-10-CM | POA: Insufficient documentation

## 2022-04-16 DIAGNOSIS — Z794 Long term (current) use of insulin: Secondary | ICD-10-CM | POA: Diagnosis not present

## 2022-04-16 DIAGNOSIS — Z7984 Long term (current) use of oral hypoglycemic drugs: Secondary | ICD-10-CM | POA: Diagnosis not present

## 2022-04-16 DIAGNOSIS — Z01812 Encounter for preprocedural laboratory examination: Secondary | ICD-10-CM

## 2022-04-16 DIAGNOSIS — E1165 Type 2 diabetes mellitus with hyperglycemia: Secondary | ICD-10-CM

## 2022-04-16 DIAGNOSIS — E785 Hyperlipidemia, unspecified: Secondary | ICD-10-CM | POA: Diagnosis not present

## 2022-04-16 DIAGNOSIS — G629 Polyneuropathy, unspecified: Secondary | ICD-10-CM | POA: Insufficient documentation

## 2022-04-16 HISTORY — PX: DUPUYTREN CONTRACTURE RELEASE: SHX1478

## 2022-04-16 LAB — GLUCOSE, CAPILLARY
Glucose-Capillary: 181 mg/dL — ABNORMAL HIGH (ref 70–99)
Glucose-Capillary: 154 mg/dL — ABNORMAL HIGH (ref 70–99)
Glucose-Capillary: 135 mg/dL — ABNORMAL HIGH (ref 70–99)

## 2022-04-16 SURGERY — RELEASE, DUPUYTREN CONTRACTURE
Anesthesia: General | Site: Ring Finger | Laterality: Left

## 2022-04-16 MED ORDER — FENTANYL CITRATE (PF) 100 MCG/2ML IJ SOLN
INTRAMUSCULAR | Status: AC
  Administered 2022-04-16: 50 ug via INTRAVENOUS
  Filled 2022-04-16: qty 2

## 2022-04-16 MED ORDER — PROPRANOLOL HCL ER 60 MG PO CP24: 60.0000 mg | ORAL_CAPSULE | Freq: Every day | ORAL | Status: DC

## 2022-04-16 MED ORDER — LIDOCAINE HCL (PF) 2 % IJ SOLN
INTRAMUSCULAR | Status: AC
Start: 2022-04-16 — End: ?
  Filled 2022-04-16: qty 5

## 2022-04-16 MED ORDER — FENTANYL CITRATE (PF) 100 MCG/2ML IJ SOLN
INTRAMUSCULAR | Status: AC
  Filled 2022-04-16: qty 2

## 2022-04-16 MED ORDER — FAMOTIDINE 20 MG PO TABS
ORAL_TABLET | ORAL | Status: AC
  Administered 2022-04-16: 20 mg via ORAL
  Filled 2022-04-16: qty 1

## 2022-04-16 MED ORDER — ACETAMINOPHEN 500 MG PO TABS: 1000.0000 mg | ORAL_TABLET | Freq: Once | ORAL | Status: DC | PRN

## 2022-04-16 MED ORDER — LIDOCAINE HCL (CARDIAC) PF 100 MG/5ML IV SOSY
PREFILLED_SYRINGE | INTRAVENOUS | Status: DC | PRN
  Administered 2022-04-16: 100 mg via INTRAVENOUS

## 2022-04-16 MED ORDER — 0.9 % SODIUM CHLORIDE (POUR BTL) OPTIME
TOPICAL | Status: DC | PRN
  Administered 2022-04-16: 250 mL

## 2022-04-16 MED ORDER — LABETALOL HCL 5 MG/ML IV SOLN
10.0000 mg | Freq: Once | INTRAVENOUS | Status: AC
  Administered 2022-04-16: 10 mg via INTRAVENOUS

## 2022-04-16 MED ORDER — TRAZODONE HCL 50 MG PO TABS: 50.0000 mg | ORAL_TABLET | Freq: Every day | ORAL | Status: DC

## 2022-04-16 MED ORDER — FENTANYL CITRATE (PF) 100 MCG/2ML IJ SOLN
25.0000 ug | INTRAMUSCULAR | Status: DC | PRN
  Administered 2022-04-16 (×3): 25 ug via INTRAVENOUS

## 2022-04-16 MED ORDER — HYDRALAZINE HCL 20 MG/ML IJ SOLN
INTRAMUSCULAR | Status: AC
  Administered 2022-04-16: 10 mg via INTRAVENOUS
  Filled 2022-04-16: qty 1

## 2022-04-16 MED ORDER — SODIUM CHLORIDE 0.9 % IV SOLN: INTRAVENOUS | Status: DC

## 2022-04-16 MED ORDER — BUPIVACAINE HCL (PF) 0.5 % IJ SOLN
INTRAMUSCULAR | Status: DC | PRN
  Administered 2022-04-16: 15 mL

## 2022-04-16 MED ORDER — PROPOFOL 10 MG/ML IV BOLUS
INTRAVENOUS | Status: DC | PRN
  Administered 2022-04-16: 30 mg via INTRAVENOUS
  Administered 2022-04-16: 100 mg via INTRAVENOUS
  Administered 2022-04-16: 30 mg via INTRAVENOUS

## 2022-04-16 MED ORDER — ONDANSETRON HCL 4 MG/2ML IJ SOLN
INTRAMUSCULAR | Status: AC
  Filled 2022-04-16: qty 2

## 2022-04-16 MED ORDER — CEFAZOLIN SODIUM-DEXTROSE 2-4 GM/100ML-% IV SOLN
2.0000 g | INTRAVENOUS | Status: AC
  Administered 2022-04-16: 2 g via INTRAVENOUS

## 2022-04-16 MED ORDER — FAMOTIDINE 20 MG PO TABS: 20.0000 mg | ORAL_TABLET | Freq: Once | ORAL | Status: AC

## 2022-04-16 MED ORDER — BUPIVACAINE HCL (PF) 0.5 % IJ SOLN
INTRAMUSCULAR | Status: AC
Start: 2022-04-16 — End: ?
  Filled 2022-04-16: qty 30

## 2022-04-16 MED ORDER — LABETALOL HCL 5 MG/ML IV SOLN: 10.0000 mg | Freq: Once | INTRAVENOUS | Status: AC

## 2022-04-16 MED ORDER — FENTANYL CITRATE (PF) 100 MCG/2ML IJ SOLN
INTRAMUSCULAR | Status: DC | PRN
  Administered 2022-04-16 (×2): 25 ug via INTRAVENOUS
  Administered 2022-04-16: 50 ug via INTRAVENOUS
  Administered 2022-04-16: 25 ug via INTRAVENOUS
  Administered 2022-04-16: 50 ug via INTRAVENOUS
  Administered 2022-04-16: 25 ug via INTRAVENOUS

## 2022-04-16 MED ORDER — FLUOXETINE HCL 10 MG PO CAPS: 30.0000 mg | ORAL_CAPSULE | Freq: Every day | ORAL | Status: DC

## 2022-04-16 MED ORDER — CHLORHEXIDINE GLUCONATE 0.12 % MT SOLN: 15.0000 mL | Freq: Once | OROMUCOSAL | Status: AC

## 2022-04-16 MED ORDER — FENTANYL CITRATE (PF) 100 MCG/2ML IJ SOLN
INTRAMUSCULAR | Status: AC
  Administered 2022-04-16: 25 ug via INTRAVENOUS
  Filled 2022-04-16: qty 2

## 2022-04-16 MED ORDER — CHLORHEXIDINE GLUCONATE 0.12 % MT SOLN
OROMUCOSAL | Status: AC
  Administered 2022-04-16: 15 mL via OROMUCOSAL
  Filled 2022-04-16: qty 15

## 2022-04-16 MED ORDER — OXYCODONE HCL 5 MG PO TABS: 5.0000 mg | ORAL_TABLET | Freq: Once | ORAL | Status: AC | PRN

## 2022-04-16 MED ORDER — ORAL CARE MOUTH RINSE: 15.0000 mL | Freq: Once | OROMUCOSAL | Status: AC

## 2022-04-16 MED ORDER — PROPOFOL 10 MG/ML IV BOLUS
INTRAVENOUS | Status: AC
  Filled 2022-04-16: qty 20

## 2022-04-16 MED ORDER — ONDANSETRON HCL 4 MG/2ML IJ SOLN
INTRAMUSCULAR | Status: DC | PRN
  Administered 2022-04-16: 4 mg via INTRAVENOUS

## 2022-04-16 MED ORDER — OXYCODONE HCL 5 MG PO TABS
ORAL_TABLET | ORAL | Status: AC
  Administered 2022-04-16: 5 mg via ORAL
  Filled 2022-04-16: qty 1

## 2022-04-16 MED ORDER — LABETALOL HCL 5 MG/ML IV SOLN
INTRAVENOUS | Status: AC
  Administered 2022-04-16: 10 mg via INTRAVENOUS
  Filled 2022-04-16: qty 4

## 2022-04-16 MED ORDER — DEXAMETHASONE SODIUM PHOSPHATE 10 MG/ML IJ SOLN
INTRAMUSCULAR | Status: AC
  Filled 2022-04-16: qty 1

## 2022-04-16 MED ORDER — EPHEDRINE SULFATE (PRESSORS) 50 MG/ML IJ SOLN
INTRAMUSCULAR | Status: DC | PRN
  Administered 2022-04-16 (×5): 5 mg via INTRAVENOUS

## 2022-04-16 MED ORDER — HYDRALAZINE HCL 20 MG/ML IJ SOLN: 10.0000 mg | Freq: Once | INTRAMUSCULAR | Status: AC

## 2022-04-16 MED ORDER — CEFAZOLIN SODIUM-DEXTROSE 2-4 GM/100ML-% IV SOLN
INTRAVENOUS | Status: AC
  Filled 2022-04-16: qty 100

## 2022-04-16 MED ORDER — LANTUS SOLOSTAR 100 UNIT/ML ~~LOC~~ SOPN: 20.0000 [IU] | PEN_INJECTOR | Freq: Every day | SUBCUTANEOUS | Status: DC

## 2022-04-16 SURGICAL SUPPLY — 36 items
APL PRP STRL LF DISP 70% ISPRP (MISCELLANEOUS) ×1
BNDG CMPR 5X4 CHSV STRCH STRL (GAUZE/BANDAGES/DRESSINGS) ×1
BNDG CMPR 75X21 PLY HI ABS (MISCELLANEOUS) ×1
BNDG COHESIVE 4X5 TAN STRL LF (GAUZE/BANDAGES/DRESSINGS) ×2 IMPLANT
BNDG ELASTIC 2X5.8 VLCR STR LF (GAUZE/BANDAGES/DRESSINGS) ×2 IMPLANT
BNDG ELASTIC 3X5.8 VLCR STR LF (GAUZE/BANDAGES/DRESSINGS) ×2 IMPLANT
BNDG ESMARK 4X12 TAN STRL LF (GAUZE/BANDAGES/DRESSINGS) ×2 IMPLANT
CHLORAPREP W/TINT 26 (MISCELLANEOUS) ×2 IMPLANT
CORD BIP STRL DISP 12FT (MISCELLANEOUS) ×2 IMPLANT
CUFF TOURN SGL QUICK 18X4 (TOURNIQUET CUFF) ×1 IMPLANT
DRAPE SURG 17X11 SM STRL (DRAPES) ×2 IMPLANT
ELECT REM PT RETURN 9FT ADLT (ELECTROSURGICAL) ×2
ELECTRODE REM PT RTRN 9FT ADLT (ELECTROSURGICAL) ×1 IMPLANT
FORCEPS JEWEL BIP 4-3/4 STR (INSTRUMENTS) ×2 IMPLANT
GAUZE SPONGE 4X4 12PLY STRL (GAUZE/BANDAGES/DRESSINGS) ×2 IMPLANT
GAUZE STRETCH 2X75IN STRL (MISCELLANEOUS) ×2 IMPLANT
GAUZE XEROFORM 1X8 LF (GAUZE/BANDAGES/DRESSINGS) ×2 IMPLANT
GLOVE BIO SURGEON STRL SZ8 (GLOVE) ×4 IMPLANT
GLOVE SURG UNDER LTX SZ8 (GLOVE) ×2 IMPLANT
GOWN STRL REUS W/ TWL LRG LVL3 (GOWN DISPOSABLE) ×1 IMPLANT
GOWN STRL REUS W/ TWL XL LVL3 (GOWN DISPOSABLE) ×1 IMPLANT
GOWN STRL REUS W/TWL LRG LVL3 (GOWN DISPOSABLE) ×2
GOWN STRL REUS W/TWL XL LVL3 (GOWN DISPOSABLE) ×2
KIT TURNOVER KIT A (KITS) ×2 IMPLANT
MANIFOLD NEPTUNE II (INSTRUMENTS) ×2 IMPLANT
NS IRRIG 1000ML POUR BTL (IV SOLUTION) ×2 IMPLANT
NS IRRIG 500ML POUR BTL (IV SOLUTION) ×2 IMPLANT
PACK EXTREMITY ARMC (MISCELLANEOUS) ×2 IMPLANT
PAD PREP 24X41 OB/GYN DISP (PERSONAL CARE ITEMS) ×2 IMPLANT
SPLINT CAST 1 STEP 4X15 (MISCELLANEOUS) ×1 IMPLANT
SPONGE GAUZE 2X2 8PLY STRL LF (GAUZE/BANDAGES/DRESSINGS) ×1 IMPLANT
STOCKINETTE IMPERVIOUS 9X36 MD (GAUZE/BANDAGES/DRESSINGS) ×2 IMPLANT
SUT PROLENE 4 0 PS 2 18 (SUTURE) ×4 IMPLANT
SUT VIC AB 3-0 SH 27 (SUTURE)
SUT VIC AB 3-0 SH 27X BRD (SUTURE) ×1 IMPLANT
WATER STERILE IRR 500ML POUR (IV SOLUTION) ×2 IMPLANT

## 2022-04-16 NOTE — Discharge Instructions (Addendum)
Orthopedic discharge instructions: Keep splint dry and intact. Keep hand elevated above heart level. Apply ice to affected area frequently. Take pain medication as prescribed or ES Tylenol when needed.  Return for follow-up in 3 days as scheduled for dressing change.AMBULATORY SURGERY  DISCHARGE INSTRUCTIONS   The drugs that you were given will stay in your system until tomorrow so for the next 24 hours you should not:  Drive an automobile Make any legal decisions Drink any alcoholic beverage   You may resume regular meals tomorrow.  Today it is better to start with liquids and gradually work up to solid foods.  You may eat anything you prefer, but it is better to start with liquids, then soup and crackers, and gradually work up to solid foods.   Please notify your doctor immediately if you have any unusual bleeding, trouble breathing, redness and pain at the surgery site, drainage, fever, or pain not relieved by medication.    Additional Instructions:        Please contact your physician with any problems or Same Day Surgery at [phone], Monday through Friday 6 am to 4 pm, or [place] at [facility] number at [phone].

## 2022-04-16 NOTE — Transfer of Care (Signed)
Immediate Anesthesia Transfer of Care Note  Patient: Walter Harris  Procedure(s) Performed: DUPUYTREN CONTRACTURE RELEASE (Left: Ring Finger)  Patient Location: PACU  Anesthesia Type:General  Level of Consciousness: drowsy  Airway & Oxygen Therapy: Patient Spontanous Breathing and Patient connected to face mask oxygen  Post-op Assessment: Report given to RN and Post -op Vital signs reviewed and stable  Post vital signs: Reviewed and stable  Last Vitals:  Vitals Value Taken Time  BP 166/93   Temp    Pulse 65   Resp 13   SpO2 99     Last Pain:  Vitals:   04/16/22 1216  PainSc: 0-No pain         Complications: No notable events documented.

## 2022-04-16 NOTE — Anesthesia Procedure Notes (Signed)
Procedure Name: LMA Insertion Date/Time: 04/16/2022 3:41 PM  Performed by: Lily Peer, Donevin Sainsbury, CRNAPre-anesthesia Checklist: Patient identified, Emergency Drugs available, Suction available and Patient being monitored Patient Re-evaluated:Patient Re-evaluated prior to induction Oxygen Delivery Method: Circle system utilized Preoxygenation: Pre-oxygenation with 100% oxygen Induction Type: IV induction Ventilation: Mask ventilation without difficulty LMA: LMA inserted LMA Size: 4.0 Number of attempts: 1 Placement Confirmation: positive ETCO2 and breath sounds checked- equal and bilateral Tube secured with: Tape Dental Injury: Teeth and Oropharynx as per pre-operative assessment

## 2022-04-16 NOTE — H&P (Signed)
History of Present Illness: Walter Harris is a 76 y.o. 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 This is an initial visit. The patient states his left hand has been catching for at least 6 months.  He denies any history of injury to the finger and denies any numbness or tingling to the fingertip.  The patient has diabetes. His primary care physician is Elsie Stain, MD. He has had his shingles vaccine. The patient lives in [place].  I have reviewed past medical, surgical, social and family history, and allergies as documented in the EMR.  Past Medical History:  Anxiety   Current mild episode of major depressive disorder without prior episode (CMS-HCC) 02/17/2020   Diabetic acidosis, type II (CMS-HCC)   History of cancer (numerous skin cancers)   Hyperlipidemia associated with type 2 diabetes mellitus , unspecified  Hypertension   Osteoarthritis of knee 09/20/2015   Past Surgical History:  TONSILLECTOMY 12-03[birth year]53   CATARACT EXTRACTION 12-04-2018   APPENDECTOMY   back surgery   coleostoma surgery   ear surgery   elbow surgery   [place]ee surgery   neck surgery   nose surgery   shoulder surgery   skin cancer removal   throat surgery   Past Family History:  Cancer Mother   Bipolar disorder Mother   COPD Mother   Anxiety Mother (Deceased 12/04/1999)   Glaucoma Father (deceased, 36)   Arthritis Sister   Depression Sister (Bipolar)   Medications:  ALPRAZolam (XANAX) 0.5 MG tablet Xanax 0.5 mg tablet Take 1 tablet 3 times a day by oral route.   aspirin 81 MG EC tablet Take 81 mg by mouth once daily   atorvastatin (LIPITOR) 20 MG tablet Take 20 mg by mouth once daily   blood glucose diagnostic test strip 1 each by Other route continuously as needed.   blood-glucose meter,continuous (DEXCOM G6 RECEIVER) Misc Use to check blood sugar multiple times daily 1 each 0   blood-glucose sensor (DEXCOM G6 SENSOR) Devi Use to check blood sugar multiple times. Change every  10 days. 3 each 11   blood-glucose transmitter (DEXCOM G6 TRANSMITTER) Devi Replace every 90 days 1 each 4   carisoprodoL (SOMA) 350 MG tablet Take 350 mg by mouth 3 (three) times daily as needed   clopidogreL (PLAVIX) 75 mg tablet Take 75 mg by mouth once daily   ezetimibe (ZETIA) 10 mg tablet Take 10 mg by mouth once daily   FLUoxetine (PROZAC) 10 MG capsule Take 10 mg by mouth once daily   gabapentin (NEURONTIN) 300 MG capsule Take 300 mg by mouth 2 (two) times daily   glipiZIDE (GLUCOTROL XL) 10 MG XL tablet TAKE 1 TABLET BY MOUTH DAILY 90 tablet 4   irbesartan (AVAPRO) 150 MG tablet TAKE ONE TABLET EVERY DAY 30 tablet 11   LANTUS SOLOSTAR U-100 INSULIN pen injector (concentration 100 units/mL) INJECT 50 UNITS SQ NIGHTLY 15 mL 3   lisinopriL (ZESTRIL) 2.5 MG tablet Take 2.5 mg by mouth once daily   losartan (COZAAR) 50 MG tablet Take 50 mg by mouth once daily   metFORMIN (GLUCOPHAGE-XR) 500 MG XR tablet TAKE ONE TABLET 3 TIMES DAILY 270 tablet 3   oxyCODONE-acetaminophen (PERCOCET) 5-325 mg tablet Take 1 tablet by mouth continuously as needed.   pen needle, diabetic 32 gauge x 5/32" Ndle Apply 1 Needle free injection topically once daily.   pioglitazone (ACTOS) 30 MG tablet Take 30 mg by mouth once daily   propranolol (INDERAL)  60 MG tablet Take 60 mg by mouth once daily   rosuvastatin (CRESTOR) 10 MG tablet Take 10 mg by mouth once daily   tiZANidine (ZANAFLEX) 4 MG tablet   traZODone (DESYREL) 50 MG tablet Take 50 mg by mouth nightly.   primidone (MYSOLINE) 50 MG tablet Take 1 tablet (50 mg total) by mouth once daily 30 tablet 11   Allergies:  Bee Venom Protein (Honey Bee) Anaphylaxis   Pravastatin Other (Severe myalgias)   Ace Inhibitors Other (Cough)   Aripiprazole Other (Unknown reaction many years ago)   Canagliflozin (Swelling and edema)   Losartan Other (Leg cramps)   Metformin Other (Able to tolerate XR formulation, Intolerant, not allergic)   NSAIDs (Would avoid given  creatinine elevation)   Olanzapine Other (Unknown reaction many years ago)   Other Other (See Comments) and Cough  Aches and cramps  Blood pressure medication caused a cough after one dose (Intolerant, not allergic)   Pioglitazone Other (Intolerant, not allergic)   Primidone Other (Unknown allergic reaction many years ago)   Rosuvastatin Calcium Other (Aches, even with twice weekly dosing)   Codeine Phosphate Rash   Divalproex Sodium Other (tremors)   Lithium Carbonate Other (Tremors)   Sulfamethoxazole-Trimethoprim Other (Mild reaction per Dr. Silvio Pate, pt does not remember the reaction)   Review of Systems: A comprehensive 14 point ROS was performed, reviewed, and the pertinent orthopaedic findings are documented in the HPI.  Physical Exam:  Vitals:  04/03/22 0909  BP: 114/66 Body mass index is 30.81 kg/m.  General/Constitutional: No apparent distress: well-nourished and well developed. Eyes: Pupils equal, round with synchronous movement. Lungs: Clear to auscultation HEENT: Normal Vascular: No edema, swelling or tenderness, except as noted in detailed exam. Lungs: Clear to auscultation. Cardiac: Heart rate and rhythm is regular. Integumentary: No impressive skin lesions present, except as noted in detailed exam. Neuro/Psych: Normal mood and affect, oriented to person, place and time.  Left Hand exam: On exam, no swelling, erythema, ecchymosis, abrasions, or other skin abnormalities are identified.  There is an obvious Dupuytren's cord affecting the ring finger as the PIP joint is flexed to 90 degrees and the MCP joint is flexed at 20 degrees.  He has no tenderness to palpation along his cord, nor are there any other areas of tenderness around dorsal or palmar aspects of the left hand.  He is neurovascularly intact to all digits.  Radiographs: No new imaging studies were obtained or reviewed today.  Assessment: 1. Dupuytren's contracture.   Plan: The treatment options,  including both surgical and nonsurgical choices, have been discussed in detail with the patient. The patient would like to proceed with surgical intervention to include an excision of the Dupuytren's contracture of the left ring finger. The risks (including bleeding, infection, nerve and/or blood vessel injury, persistent or recurrent pain, stiffness of the finger, wound recurrence fracture, weakness of grip, need for further surgery, blood clots, strokes, heart attacks or arrhythmias, pneumonia, etc.) and benefits of the surgical procedure were discussed. The patient states his understanding and agrees to proceed. A formal written consent will be obtained by the nursing staff.   H&P reviewed and patient re-examined. No changes.

## 2022-04-16 NOTE — Anesthesia Preprocedure Evaluation (Addendum)
Anesthesia Evaluation  Patient identified by MRN, date of birth, ID band Patient awake    Reviewed: Allergy & Precautions, NPO status , Patient's Chart, lab work & pertinent test results, reviewed documented beta blocker date and time   History of Anesthesia Complications (+) history of anesthetic complications (Post-operative cognitive dysfunction 2012)  Airway Mallampati: II  TM Distance: >3 FB Neck ROM: Full    Dental  (+) Upper Dentures, Partial Lower   Pulmonary former smoker,    breath sounds clear to auscultation       Cardiovascular Exercise Tolerance: Good hypertension, (-) angina Rhythm:Regular Rate:Normal  ECHO: 1. Left ventricular ejection fraction, by estimation, is 65 to 70%. The  left ventricle has normal function. The left ventricle has no regional  wall motion abnormalities. Left ventricular diastolic parameters are  consistent with Grade I diastolic  dysfunction (impaired relaxation).  2. Right ventricular systolic function is normal. The right ventricular  size is not well visualized. Tricuspid regurgitation signal is inadequate  for assessing PA pressure.  3. The mitral valve is grossly normal. No evidence of mitral valve  regurgitation. No evidence of mitral stenosis.  4. The aortic valve was not well visualized. Aortic valve regurgitation  is not visualized. No aortic stenosis is present.   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)    GI/Hepatic neg GERD  ,  Endo/Other  diabetes  Renal/GU Renal disease (Kidney stones)     Musculoskeletal  (+) Arthritis ,   Abdominal Normal abdominal exam  (+)   Peds  Hematology  (+) Blood dyscrasia, anemia ,   Anesthesia Other Findings    Reproductive/Obstetrics                            Anesthesia Physical  Anesthesia Plan  ASA: III  Anesthesia Plan: General   Post-op Pain Management: Regional block* and Minimal or no pain anticipated   Induction: Intravenous  PONV Risk Score and Plan: 2 and Ondansetron and Dexamethasone  Airway Management Planned: LMA  Additional Equipment:   Intra-op Plan:   Post-operative Plan: Extubation in OR  Informed Consent: I have reviewed the patients History and Physical, chart, labs and discussed the procedure including the risks, benefits and alternatives for the proposed anesthesia with the patient or authorized representative who has indicated his/her understanding and acceptance.     Dental advisory given  Plan Discussed with: CRNA and Anesthesiologist  Anesthesia Plan Comments:        Anesthesia Quick Evaluation    Active Ambulatory Problems    Diagnosis Date Noted  . Uncontrolled type 2 diabetes mellitus with hyperglycemia ([place]) 05/26/2007  . HLD (hyperlipidemia) 05/13/2008  . Essential hypertension 05/26/2007  . SCOLIOSIS 05/26/2007  . LUMBAR RADICULOPATHY 10/16/2010  . Basal cell carcinoma   . Rhinitis, chronic 01/13/2012  . Diabetic polyneuropathy ([place]) 02/14/2014  . Anxiety state 10/27/2014  . Dysgeusia 11/28/2014  . Medicare annual wellness visit, initial 03/08/2015  . Advance care planning 03/08/2015  . Chronic back pain 04/12/2015  . Syncope 07/08/2015  . Muscle ache 10/05/2015  . Panic 01/10/2017  . Neck pain 05/14/2017  . Encounter for chronic pain management 06/18/2017  . Lightheaded 08/28/2017  . Paresthesia 12/03/2017  . Memory change 12/17/2017  . Health care maintenance 10/04/2018  . Thumb pain 10/04/2018  .  Gait abnormality 11/12/2019  . Tremor, essential 11/12/2019  . History of CVA in adulthood 12/07/2019  . Fall at home 08/09/2020  . Statin myopathy 09/10/2020  . Left shoulder pain 11/11/2020  .  Dupuytren contracture 02/21/2021  . Lower extremity edema 02/21/2021  . Insomnia 01/09/2022  . Decreased dorsalis pedis pulse 01/09/2022   Resolved Ambulatory Problems    Diagnosis Date Noted  . SINUSITIS - ACUTE-NOS 12/12/2009  . CELLULITIS 03/17/2007  . BACK PAIN 08/08/2009  . FOOT PAIN, LEFT 05/22/2010  . FATIGUE 03/24/2009  . Tremor 05/26/2007  . CHEST PAIN 07/25/2008  . Abdominal pain 08/11/2007  . COLONIC POLYPS, HX OF 05/26/2007  . DIABETES MELLITUS, TYPE II, UNCONTROLLED 09/07/2010  . MYALGIA 08/21/2010  . Syncope 07/23/2011  . Abdominal bloating 10/11/2011  . Chest wall pain 11/25/2011  . Cough 01/13/2012  . Cough, persistent 01/13/2012  . Bipolar disorder ([place]) 06/01/2012  . Dizziness 10/01/2012  . Other malaise and fatigue 01/11/2013  . Cough 09/17/2013  . Lung nodule 09/17/2013  . Dysuria 03/29/2016  . Acute non-recurrent maxillary sinusitis 05/29/2016  . ETD (eustachian tube dysfunction) 02/10/2017  . Fatigue 04/18/2017  . CVA (cerebral vascular accident) ([place]) 12/06/2019   Past Medical History:  Diagnosis Date  . Cataracts, both eyes 2019  . Complication of anesthesia   . Deaf   . Diabetes mellitus   . DJD (degenerative joint disease)   . Hearing impaired   . History of blood transfusion   . History of colonic polyps   . History of kidney stones   . Hyperlipidemia   . Hypertension   . Nose fracture   . OCD (obsessive compulsive disorder)   . Stroke ([place]) 12/06/2019  . Stuttering   . Vertigo   . Wears dentures   . Wears glasses     CBC    Component Value Date/Time   WBC 6.5 04/11/2022 1424   RBC 4.14 (L) 04/11/2022 1424   HGB 12.4 (L) 04/11/2022 1424   HGB 14.9 09/13/2013 1525   HCT 37.7 (L) 04/11/2022 1424   HCT 44.8 09/13/2013 1525   PLT 213 04/11/2022 1424   PLT 184 09/13/2013 1525   MCV 91.1 04/11/2022 1424   MCV 92 09/13/2013 1525   MCH 30.0 04/11/2022 1424   MCHC 32.9 04/11/2022 1424   RDW 12.7 04/11/2022 1424   RDW 14.3  09/13/2013 1525   LYMPHSABS 1.2 02/20/2021 1133   MONOABS 0.6 02/20/2021 1133   EOSABS 0.1 02/20/2021 1133   BASOSABS 0.0 02/20/2021 1133    CMP     Component Value Date/Time   NA 138 04/11/2022 1424   NA 138 09/13/2013 1525   K 4.2 04/11/2022 1424   K 4.0 09/13/2013 1525   CL 104 04/11/2022 1424   CL 106 09/13/2013 1525   CO2 25 04/11/2022 1424   CO2 26 09/13/2013 1525   GLUCOSE 189 (H) 04/11/2022 1424   GLUCOSE 162 (H) 09/13/2013 1525   BUN 14 04/11/2022 1424   BUN 18 09/13/2013 1525   CREATININE 1.20 04/11/2022 1424   CREATININE 1.1 09/29/2019 0000   CREATININE 1.20 (H) 02/13/2018 1520   CALCIUM 8.8 (L) 04/11/2022 1424   CALCIUM 9.1 09/13/2013 1525   PROT 7.0 02/20/2021 1133   PROT 6.9 09/13/2013 1525   ALBUMIN 4.3 02/20/2021 1133   ALBUMIN 3.6 09/13/2013 1525   AST 15 02/20/2021 1133   AST 27 09/29/2019 0000   ALT 17 02/20/2021 1133   ALT 34 (A) 09/29/2019 0000  ALT 23 09/13/2013 1525   ALKPHOS 60 02/20/2021 1133   ALKPHOS 74 09/13/2013 1525   BILITOT 0.7 02/20/2021 1133   BILITOT 0.5 09/13/2013 1525   GFRNONAA >60 04/11/2022 1424   GFRNONAA >60 09/13/2013 1525   GFRAA >60 12/07/2019 0429   GFRAA >60 09/13/2013 1525    COAGS No results found for: "INR", "PTT"  I have seen and consented the patient, Walter Harris. I have answered all of his questions regarding anesthesia. he is appropriately NPO.   Josephina Shih, MD Anesthesia

## 2022-04-16 NOTE — Op Note (Signed)
04/16/2022  5:36 PM  Patient:   Walter Harris  Pre-Op Diagnosis:   Dupuytren's contracture, left ring finger.  Post-Op Diagnosis:   Same.  Procedure:   Release of Dupuytren's contracture, left ring finger.  Surgeon:   Pascal Lux, MD  Assistant:   None  Anesthesia:   General LMA  Findings:   As above.  Complications:   None  EBL:   0 cc  Fluids:   600 cc crystalloid  TT:   80 minutes at 250 mmHg  Drains:   None  Closure:   4-0 Prolene interrupted sutures  Brief Clinical Note:   The patient is a 76 year old male with a history of a progressively worsening contractures of the left ring PIP and MCP joints. The patient's history and examination are consistent with a Dupuytren's contracture of the left ring finger. The patient presents at this time for release of the Dupuytren's contracture of the left ring finger.  Procedure:   The patient was brought into the operating room and lain in the supine position. After adequate general laryngeal mask anesthesia was achieved, the left hand and upper extremity were prepped with ChloraPrep solution before being draped sterilely. Preoperative antibiotics were administered. A timeout to verify the appropriate surgical site.    A Brunner type zigzag incision was made along the volar aspect of the ring finger beginning just proximal to the proximal palmar crease and extending to the PIP flexion crease. The incision was carried down through subcutaneous tissues. The fibrous cord was identified and carefully dissected out from proximal to distal after releasing it proximally. As dissection was carried out, care was taken to identify and protect the common digital nerve and artery on either side of the cord, as well as the underlying flexor tendon, proximally. More distally, the digital neurovascular bundles were identified and protected. After the mass was removed in its entirety, the adequacy of excision was verified by palpation as well as  visually. After excision of the Dupuytren's tissue, the left ring finger MCP could be extended fully, well the PIP joint could be extended within 25 degrees of full extension.  This was felt to be sufficient, given the severity of his contracture preoperatively.  The wound was copiously irrigated with sterile saline solution before the skin was reapproximated using 4-0 Prolene interrupted sutures. A total of 15 cc of 0.25% plain Sensorcaine was injected in and around the incision to help with postoperative analgesia before a sterile bulky dressing and volar splint extending to the fingertips was applied, maintaining the MCP joints in extension.  Once adequate perfusion to all digits was verified, the patient was awakened, extubated, and returned to the recovery room in satisfactory condition after tolerating the procedure well.

## 2022-04-17 ENCOUNTER — Encounter: Payer: Self-pay | Admitting: Surgery

## 2022-04-18 LAB — SURGICAL PATHOLOGY

## 2022-04-29 DIAGNOSIS — M720 Palmar fascial fibromatosis [Dupuytren]: Secondary | ICD-10-CM | POA: Diagnosis not present

## 2022-04-29 NOTE — Anesthesia Postprocedure Evaluation (Signed)
Anesthesia Post Note  Patient: Walter Harris  Procedure(s) Performed: DUPUYTREN CONTRACTURE RELEASE (Left: Ring Finger)     Anesthesia Post Evaluation No notable events documented.  Molli Barrows

## 2022-05-07 ENCOUNTER — Ambulatory Visit: Payer: PPO | Attending: Family Medicine

## 2022-05-07 DIAGNOSIS — R0989 Other specified symptoms and signs involving the circulatory and respiratory systems: Secondary | ICD-10-CM | POA: Diagnosis not present

## 2022-05-15 ENCOUNTER — Other Ambulatory Visit: Payer: Self-pay | Admitting: Family Medicine

## 2022-05-15 DIAGNOSIS — R6889 Other general symptoms and signs: Secondary | ICD-10-CM

## 2022-05-22 ENCOUNTER — Telehealth: Payer: Self-pay

## 2022-05-22 NOTE — Chronic Care Management (AMB) (Signed)
    Chronic Care Management Pharmacy Assistant   Name: Walter Harris  MRN: [id number] DOB: 12-26-1945   Reason for Encounter: Reminder Call   Medications: Outpatient Encounter Medications as of 05/22/2022  Medication Sig   ALPRAZolam (XANAX) 0.5 MG tablet TAKE ONE TABLET BY MOUTH TWICE DAILY WITH AN EXTRA 1/2 TABLET IF NEEDED. MAY CAUSE DROWSINESS.   aspirin 81 MG EC tablet Take 1 tablet (81 mg total) by mouth daily. For stroke prevention (Patient taking differently: Take 81 mg by mouth daily with lunch. For stroke prevention)   BD PEN NEEDLE NANO U/F 32G X 4 MM MISC USE AS DIRECTED   clopidogrel (PLAVIX) 75 MG tablet TAKE 1 TABLET BY MOUTH DAILY   FLUoxetine (PROZAC) 10 MG capsule Take 3 capsules (30 mg total) by mouth daily with lunch. TAKE 3 CAPSULES BY MOUTH DAILY FOR MOOD AS DIRECTED   glipiZIDE (GLUCOTROL XL) 10 MG 24 hr tablet Take 1 tablet (10 mg total) by mouth daily with breakfast. For diabetes   insulin glargine (LANTUS SOLOSTAR) 100 UNIT/ML Solostar Pen Inject 20 Units into the skin at bedtime. For diabetes   irbesartan (AVAPRO) 150 MG tablet Take 1 tablet (150 mg total) by mouth daily. For blood pressure   metFORMIN (GLUCOPHAGE-XR) 500 MG 24 hr tablet TAKE ONE TABLET 3 TIMES DAILY for diabetes   oxyCODONE-acetaminophen (PERCOCET/ROXICET) 5-325 MG tablet TAKE ONE TO TWO TABLETS BY MOUTH 3 TIMESDAILY AS NEEDED FOR PAIN.  Fill on/after 06/01/22   oxyCODONE-acetaminophen (PERCOCET/ROXICET) 5-325 MG tablet TAKE ONE TO TWO TABLETS BY MOUTH 3 TIMES DAILY IF NEEDED FOR PAIN. Fill on/after 04/02/22   primidone (MYSOLINE) 50 MG tablet TAKE 1 TABLET BY MOUTH DAILY   propranolol ER (INDERAL LA) 60 MG 24 hr capsule Take 1 capsule (60 mg total) by mouth at bedtime.   tiZANidine (ZANAFLEX) 4 MG tablet TAKE ONE TABLET 3 TIMES DAILY AS NEEDED FOR MUSCLE SPASM MAY TAKE 1 EXTRADOSE DAILY AS NEEDED   traZODone (DESYREL) 50 MG tablet Take 1 tablet (50 mg total) by mouth at bedtime.   No  facility-administered encounter medications on file as of 05/22/2022.   [name] was contacted to remind of upcoming telephone visit with Charlene Brooke on 05/27/22 at 11:00. Patient was reminded to have any blood glucose and blood pressure readings available for review at appointment.   Patient confirmed appointment.  Are you having any problems with your medications? No   Do you have any concerns you like to discuss with the pharmacist? No  CCM referral has been placed prior to visit?  No   Star Rating Drugs: Medication:  Last Fill: Day Supply Glipizide '10mg'$  04/15/22  90 Lantus   05/08/22 30 Irbesartan '150mg'$  05/14/22  30 Metformin XR '500mg'$    04/08/22 [place], CPP notified  Avel Sensor, [place]  [phone]

## 2022-05-27 ENCOUNTER — Telehealth: Payer: PPO

## 2022-05-28 ENCOUNTER — Ambulatory Visit: Payer: PPO | Attending: Cardiovascular Disease | Admitting: Cardiovascular Disease

## 2022-05-28 ENCOUNTER — Encounter: Payer: Self-pay | Admitting: Cardiovascular Disease

## 2022-05-28 VITALS — BP 122/80 | HR 83 | Ht 68.0 in | Wt 187.5 lb

## 2022-05-28 DIAGNOSIS — I771 Stricture of artery: Secondary | ICD-10-CM

## 2022-05-28 DIAGNOSIS — I739 Peripheral vascular disease, unspecified: Secondary | ICD-10-CM | POA: Diagnosis not present

## 2022-05-28 DIAGNOSIS — E785 Hyperlipidemia, unspecified: Secondary | ICD-10-CM | POA: Diagnosis not present

## 2022-05-28 DIAGNOSIS — I10 Essential (primary) hypertension: Secondary | ICD-10-CM

## 2022-05-28 MED ORDER — ATORVASTATIN CALCIUM 20 MG PO TABS: 20.0000 mg | ORAL_TABLET | Freq: Every day | ORAL | 5 refills | Status: DC

## 2022-05-28 NOTE — Patient Instructions (Signed)
Medication Instructions:  Your physician has recommended you make the following change in your medication:   START Atorvastatin 20 mg daily. An Rx has been sent to your pharmacy.   *If you need a refill on your cardiac medications before your next appointment, please call your pharmacy*   Lab Work: None ordered If you have labs (blood work) drawn today and your tests are completely normal, you will receive your results only by: [place] (if you have MyChart) OR A paper copy in the mail If you have any lab test that is abnormal or we need to change your treatment, we will call you to review the results.   Testing/Procedures: Your physician has requested that you have a carotid duplex. This test is an ultrasound of the carotid arteries in your neck. It looks at blood flow through these arteries that supply the brain with blood. Allow one hour for this exam. There are no restrictions or special instructions.    Follow-Up: At [facility], you and your health needs are our priority.  As part of our continuing mission to provide you with exceptional heart care, we have created designated Provider Care Teams.  These Care Teams include your primary Cardiologist (physician) and Advanced Practice Providers (APPs -  Physician Assistants and Nurse Practitioners) who all work together to provide you with the care you need, when you need it.  We recommend signing up for the patient portal called "MyChart".  Sign up information is provided on this After Visit Summary.  MyChart is used to connect with patients for Virtual Visits (Telemedicine).  Patients are able to view lab/test results, encounter notes, upcoming appointments, etc.  Non-urgent messages can be sent to your provider as well.   To learn more about what you can do with MyChart, go to [email or web address].    Your next appointment:   4 month(s)  The format for your next appointment:   In Person  Provider:   You may  see Kathlyn Sacramento, MD or one of the following Advanced Practice Providers on your designated Care Team:   Murray Hodgkins, NP Christell Faith, PA-C Cadence Kathlen Mody, PA-C Gerrie Nordmann, NP    Other Instructions N/A  Important Information About Sugar

## 2022-05-28 NOTE — Progress Notes (Signed)
Cardiology Office Note   Date:  05/28/2022   ID:  Kleber, Crean 09/04/1946, MRN [id number]  PCP:  [name], MD  Cardiologist:   Kathlyn Sacramento, MD   Chief Complaint  Patient presents with   Other    ABN ABI no complaints today. Meds reviewed verbally with pt.      History of Present Illness: Walter Harris is a 76 y.o. male who was referred by Dr. [name] for evaluation management of peripheral arterial disease.  He has known history of type 2 diabetes, essential hypertension, hyperlipidemia and previous strokes.  He was seen by me in 2016 for syncope that was felt to be vasovagal in etiology.  He was seen by Dr. Saunders Revel in 2019 for dizziness and syncope.  Symptoms were felt to be again due to vasovagal syncope.  He was also noted to be orthostatic.  Outpatient monitor showed no evidence of arrhythmia.  Echocardiogram showed normal LV systolic function with no significant valvular abnormalities. He was hospitalized in April 2021 with slurred speech and expressive aphasia.  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  Echocardiogram was unremarkable.  Carotid Doppler showed mild nonobstructive disease.  He has known history of severe low back pain and required multiple back surgeries.  His mobility is limited due to that and thus he has not noticed any calf claudication. He was found to have abnormal distal pulses. He underwent lower extremity arterial Doppler which showed normal ABI and TBI but the distal waveforms are biphasic.  In addition, blood pressure in the left arm was 47 mmHg lower than the right. He denies left arm claudication.  No lower extremity ulceration.  Past Medical History:  Diagnosis Date   Basal cell carcinoma    multiple   Cataracts, both eyes 2019   surgery pending April 7169   Complication of anesthesia    post-operative cognitive dysfunction after ACDF on 05/01/11 [facility])   Deaf    right ear   Diabetes mellitus    DJD (degenerative joint disease)     lumbar spine   Gait abnormality 11/12/2019   Hearing impaired    hearing aids-transmitter rt    History of blood transfusion    as a baby in June 04, 1946   History of colonic polyps    History of kidney stones    Hyperlipidemia    Hypertension    pt. denies high blood pressure   Memory change 12/17/2017   Nose fracture    3x   OCD (obsessive compulsive disorder)    Stroke ([place]) 12/06/2019   "mini stroke, that's what gave me the tremors"   Stuttering    Tremor, essential    Vertigo    random   Wears dentures    full upper   Wears glasses     Past Surgical History:  Procedure Laterality Date   [place]  2012   lumbar surgery   CATARACT EXTRACTION W/PHACO Right 04/12/2019   Procedure: CATARACT EXTRACTION PHACO AND INTRAOCULAR LENS PLACEMENT ([place]) RIGHT, DIABETIC;  Surgeon: [name], MD;  Location: [place];  Service: Ophthalmology;  Laterality: Right;  Diabetic - insulin and oral meds   CATARACT EXTRACTION W/PHACO Left 05/10/2019   Procedure: CATARACT EXTRACTION PHACO AND INTRAOCULAR LENS PLACEMENT ([facility])  LEFT DIABETIC  00:25.6  14.4%  3.75;  Surgeon: [name], MD;  Location: [place];  Service: Ophthalmology;  Laterality: Left;  Diabetic - insulin  and oral meds   CERVICAL FUSION  1990   CERVICAL FUSION  8/12   multiple levels with plates   CHOLESTEATOMA EXCISION  1993   COLLATERAL LIGAMENT REPAIR, ELBOW     bilateral, nerve improvement left 08/04, right 09/04   COLONOSCOPY     CYST EXCISION     back of cervical spine total of 5 surgeries   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   Procedure: DUPUYTREN CONTRACTURE RELEASE;  Surgeon: Corky Mull, MD;  Location: [facility];  Service: Orthopedics;  Laterality: Left;   EXTERNAL EAR SURGERY     multiple ear surguries as a child   OTHER SURGICAL HISTORY     benign head tumor#5   OTHER SURGICAL HISTORY     sebaceous cysts-post neck x5   SHOULDER ARTHROSCOPY W/  ACROMIAL REPAIR  08/2004   left shoulder   TONSILLECTOMY     ULNAR NERVE TRANSPOSITION Right 05/13/2013   Procedure: RIGHT ULNAR NERVE DECOMPRESSION/TRANSPOSITION;  Surgeon: [name]., MD;  Location: [place];  Service: Orthopedics;  Laterality: Right;     Current Outpatient Medications  Medication Sig Dispense Refill   ALPRAZolam (XANAX) 0.5 MG tablet TAKE ONE TABLET BY MOUTH TWICE DAILY WITH AN EXTRA 1/2 TABLET IF NEEDED. MAY CAUSE DROWSINESS. 75 tablet 2   aspirin 81 MG EC tablet Take 1 tablet (81 mg total) by mouth daily. For stroke prevention (Patient taking differently: Take 81 mg by mouth daily with lunch. For stroke prevention)     BD PEN NEEDLE NANO U/F 32G X 4 MM MISC USE AS DIRECTED 100 each 3   clopidogrel (PLAVIX) 75 MG tablet TAKE 1 TABLET BY MOUTH DAILY 90 tablet 3   FLUoxetine (PROZAC) 10 MG capsule Take 3 capsules (30 mg total) by mouth daily with lunch. TAKE 3 CAPSULES BY MOUTH DAILY FOR MOOD AS DIRECTED     glipiZIDE (GLUCOTROL XL) 10 MG 24 hr tablet Take 1 tablet (10 mg total) by mouth daily with breakfast. For diabetes     insulin glargine (LANTUS SOLOSTAR) 100 UNIT/ML Solostar Pen Inject 20 Units into the skin at bedtime. For diabetes     irbesartan (AVAPRO) 150 MG tablet Take 1 tablet (150 mg total) by mouth daily. For blood pressure     metFORMIN (GLUCOPHAGE-XR) 500 MG 24 hr tablet TAKE ONE TABLET 3 TIMES DAILY for diabetes     oxyCODONE-acetaminophen (PERCOCET/ROXICET) 5-325 MG tablet TAKE ONE TO TWO TABLETS BY MOUTH 3 TIMESDAILY AS NEEDED FOR PAIN.  Fill on/after 06/01/22 180 tablet 0   oxyCODONE-acetaminophen (PERCOCET/ROXICET) 5-325 MG tablet TAKE ONE TO TWO TABLETS BY MOUTH 3 TIMES DAILY IF NEEDED FOR PAIN. Fill on/after 04/02/22 180 tablet 0   primidone (MYSOLINE) 50 MG tablet TAKE 1 TABLET BY MOUTH DAILY 30 tablet 2   propranolol ER (INDERAL LA) 60 MG 24 hr capsule Take 1 capsule (60 mg total) by mouth at bedtime.     tiZANidine (ZANAFLEX)  4 MG tablet TAKE ONE TABLET 3 TIMES DAILY AS NEEDED FOR MUSCLE SPASM MAY TAKE 1 EXTRADOSE DAILY AS NEEDED 120 tablet 2   traZODone (DESYREL) 50 MG tablet Take 1 tablet (50 mg total) by mouth at bedtime.     No current facility-administered medications for this visit.    Allergies:   Bee venom, Pravastatin, Ace inhibitors, Actos [pioglitazone], Angiotensin receptor blockers, Aripiprazole, Crestor [rosuvastatin calcium], Invokana [canagliflozin], Metformin and related, Nsaids, Olanzapine, Pneumovax 23 [pneumococcal vac polyvalent], Codeine, Divalproex sodium, Lithium carbonate, and Sulfamethoxazole-trimethoprim  Social History:  The patient  reports that he quit smoking about 5 years ago. His smoking use included pipe and cigarettes. He has a 20.00 pack-year smoking history. He has never used smokeless tobacco. He reports that he does not drink alcohol and does not use drugs.   Family History:  The patient's family history includes Arthritis in his sister; Bipolar disorder in his mother; Cancer in his mother; Emphysema in his mother; Glaucoma in his father; OCD in his mother and sister.    ROS:  Please see the history of present illness.   Otherwise, review of systems are positive for none.   All other systems are reviewed and negative.    PHYSICAL EXAM: VS:  BP 122/80 (BP Location: Right Arm, Patient Position: Sitting, Cuff Size: Normal)   Pulse 83   Ht '5\' 8"'$  (1.727 m)   Wt 187 lb 8 oz (85 kg)   SpO2 97%   BMI 28.51 kg/m  , BMI Body mass index is 28.51 kg/m. GEN: Well nourished, well developed, in no acute distress  HEENT: normal  Neck: no JVD, carotid bruits, or masses Cardiac: RRR; no murmurs, rubs, or gallops,no edema  Respiratory:  clear to auscultation bilaterally, normal work of breathing GI: soft, nontender, nondistended, + BS MS: no deformity or atrophy  Skin: warm and dry, no rash Neuro:  Strength and sensation are intact Psych: euthymic mood, full affect Vascular:  Radial pulse: Normal on the right side and absent on the left.  Distal pulses are palpable on the left side but not the right side   EKG:  EKG is not ordered today.    Recent Labs: 04/11/2022: BUN 14; Creatinine, Ser 1.20; Hemoglobin 12.4; Platelets 213; Potassium 4.2; Sodium 138    Lipid Panel    Component Value Date/Time   CHOL 184 12/06/2021 0000   CHOL 183 09/29/2019 0000   TRIG 133 12/06/2021 0000   TRIG 154 09/29/2019 0000   HDL 40 12/06/2021 0000   CHOLHDL 5 11/10/2020 0934   VLDL 39.0 11/10/2020 0934   LDLCALC 118 12/06/2021 0000   LDLCALC 108 09/29/2019 0000   LDLDIRECT 160.2 06/01/2012 1328      Wt Readings from Last 3 Encounters:  05/28/22 187 lb 8 oz (85 kg)  04/11/22 195 lb (88.5 kg)  01/08/22 189 lb (85.7 kg)           05/28/2022    3:07 PM 04/09/2018    1:55 PM  PAD Screen  Previous PAD dx? No No  Previous surgical procedure? No No  Pain with walking? No No  Feet/toe relief with dangling? No No  Painful, non-healing ulcers? No No  Extremities discolored? No No      ASSESSMENT AND PLAN:  1.  Peripheral arterial disease: Even though his ABI was normal, his distal pulses are very diminished on the right side compared to the left.  Nonetheless, he seems to be asymptomatic likely due to his limited mobility.  He has no evidence of critical limb ischemia.  Thus, I recommend continuing medical therapy.  2.  Left subclavian artery stenosis or occlusion: The patient has no palpable pulses in the left arm.  Fortunately, he denies left arm claudication.  I requested carotid Doppler for evaluation.  3.  Hyperlipidemia: History of intolerance to statins.  Given that he is diabetic and has evidence of peripheral arterial disease, we should rechallenge him.  Elected to add atorvastatin 20 mg daily.  4.  Essential hypertension: Blood pressure is reasonably  controlled.  Always check in the right arm.    Disposition:   FU with me in 4  months  Signed,  Kathlyn Sacramento, MD  05/28/2022 3:23 PM    [facility]

## 2022-05-29 ENCOUNTER — Ambulatory Visit: Payer: PPO | Admitting: Pharmacist

## 2022-05-29 DIAGNOSIS — E1169 Type 2 diabetes mellitus with other specified complication: Secondary | ICD-10-CM

## 2022-05-29 DIAGNOSIS — E1165 Type 2 diabetes mellitus with hyperglycemia: Secondary | ICD-10-CM

## 2022-05-29 DIAGNOSIS — I10 Essential (primary) hypertension: Secondary | ICD-10-CM

## 2022-05-29 DIAGNOSIS — Z8673 Personal history of transient ischemic attack (TIA), and cerebral infarction without residual deficits: Secondary | ICD-10-CM

## 2022-05-29 NOTE — Patient Instructions (Signed)
Visit Information  Phone number for Pharmacist: [phone]   Goals Addressed   None     Care Plan : [place]  Updates made by Charlton Haws, RPH since 05/29/2022 12:00 AM     Problem: Hypertension, Hyperlipidemia, Diabetes, Chronic Kidney Disease, and Anxiety, Hx CVA   Priority: High     Long-Range Goal: Disease Management   Start Date: 03/06/2021  Expected End Date: 11/30/2022  Recent Progress: On track  Priority: High  Note:   Current Barriers:  None identified  Pharmacist Clinical Goal(s):  Patient will contact provider office for questions/concerns as evidenced notation of same in electronic health record through collaboration with PharmD and provider.   Interventions: 1:1 collaboration with [name], MD regarding development and update of comprehensive plan of care as evidenced by provider attestation and co-signature Inter-disciplinary care team collaboration (see longitudinal plan of care) Comprehensive medication review performed; medication list updated in electronic medical record  Hypertension / CKD stage 3b (BP goal <140/90) -Controlled - per clinic BP -Current home readings: none reported -Denies hypotensive/hypertensive symptoms -Current treatment: Irbesartan 150 mg daily - Appropriate, Effective, Safe, Accessible Propranolol ER 60 mg daily (tremor) - Appropriate, Effective, Safe, Accessible -Medications previously tried: amlodipine - stopped due to orthostatic hypotension -Educated on BP goals and benefits of medications for prevention of heart attack, stroke and kidney damage; -Counseled to monitor BP at home with symptoms of dizziness -Recommended to continue current medication  Diabetes (A1c goal <8%) -Controlled - A1c 9.0% (11/2021), however fructosamine 330 ~correlates to A1c 7.2%; pt is wearing Dexcom -Follows with Endocrine (Dr Honor Junes) -Current home glucose readings Fasting glucose: 120-130 Post-prandial:  <250 -Current medications: Glipizide 10 mg XL daily - Appropriate, Effective, Safe, Accessible Lantus Solostar 19 units daily -Appropriate, Effective, Safe, Accessible Metformin ER 500 mg TID -Appropriate, Effective, Safe, Accessible Dexcom G6- Appropriate, Effective, Safe, Accessible -Medications previously tried: glimepiride, Invokana (edema), pioglitazone -Reviewed A1c goal, blood sugar targets; pt denies hypoglycemia -Recommended to continue current medication; keep appts with endocrine  Hyperlipidemia: (LDL goal < 70) -Not ideally controlled - LDL 118 (10/2021), pt has not tolerated multiple statins include low dose rosuvastatin; he has also failed zetia; recently cardiology prescribed atorvastatin 20 mg to re-challenge given PAD issues -Hx CVA, PAD; statin intolerant -Current treatment: Atorvastatin 20 mg daily - Appropriate, Query Effective Clopidogrel 75 mg daily -Appropriate, Effective, Safe, Accessible Aspirin 81 mg daily -Appropriate, Effective, Safe, Accessible -Medications previously tried: pravastatin, rosuvastatin (BIW), atorvastatin, ezetimibe -Educated on Cholesterol goals; Importance of limiting foods high in cholesterol; -Pt may be a candidate for Nexletol or PCSK9-inhibitor; will reassess after repeat lipid panel -Recommend to continue current medication; advised pt to contact cardiologist if he cannot tolerate atorvastatin  Anxiety / Panic (Goal: manage symptoms) -Controlled -PHQ9: 0 (10/2021) - minimal depression -GAD7: not on file -Connected with PCP for mental health support -Current treatment: Fluoxetine 10 mg - 3 cap daily - Appropriate, Effective, Safe, Accessible Alprazolam 0.5 mg BID -Appropriate, Effective, Query Safe Trazodone 50 mg daily -Appropriate, Effective, Safe, Accessible -Medications previously tried/failed: n/a -Educated on Benefits of medication for symptom control -Previously discussed sedation/fall risk with alprazolam especially in  combination with oxycodone; pt denies oversedation or overuse of medications in general -Recommended to continue current medication  Essential tremor (Goal: manage symptoms) -Controlled -Current treatment  Propranolol ER 60 mg daily - Appropriate, Effective, Safe, Accessible Primidone 50 mg daily - Appropriate, Effective, Safe, Accessible -Medications previously tried: n/a  -Recommended to continue current medication  Patient Goals/Self-Care Activities Patient will:  - check blood pressure with any dizziness, document, and provide at future appointments -contact office with any health concerns      Patient verbalizes understanding of instructions and care plan provided today and agrees to view in [place]. Active MyChart status and patient understanding of how to access instructions and care plan via MyChart confirmed with patient.    Telephone follow up appointment with pharmacy team member scheduled for: 6 months  Charlene Brooke, PharmD, [facility] Clinical Pharmacist [facility] [phone]

## 2022-05-29 NOTE — Progress Notes (Signed)
Chronic Care Management Pharmacy Note  05/29/2022 Name:  Walter Harris MRN:  [id number] DOB:  Jul 06, 1946  Summary: CCM F/U visit -DM: A1c 9.0% (11/2021), however fructosamine 330 equivalent to A1c 7.2%; Pt is wearing Dexcom and reports sugar range 120 - 250 throughout the day -LDL 118 (11/2021); LDL goal < 70 given history of CVA, PAD; previously statin-intolerant, recently cardiologist prescribed atorvastatin 20 mg as re-challenge given PAD  Recommendations/Changes made from today's visit: -No med changes -Advised pt to contact cardiology if he does not tolerate atorvastatin challenge  Plan: -Pharmacist follow up televisit scheduled for 6 months -PCP annual due 01/2023    Subjective: Walter Harris is an 76 y.o. year old male who is a primary patient of [name], Elveria Rising, MD.  The CCM team was consulted for assistance with disease management and care coordination needs.    Engaged with patient by telephone for follow up visit in response to provider referral for pharmacy case management and/or care coordination services.   Consent to Services:  The patient was given information about Chronic Care Management services, agreed to services, and gave verbal consent prior to initiation of services.  Please see initial visit note for detailed documentation.   Patient Care Team: [name], MD as PCP - General (Family Medicine) Kary Kos, MD as Consulting Physician (Neurosurgery) Estill Cotta, MD as Referring Physician (Ophthalmology) Oneta Rack, MD as Referring Physician (Dermatology) Kary Kos, MD as Consulting Physician (Neurosurgery) Charlton Haws, [facility] as Pharmacist (Pharmacist)  Recent office visits: 01/08/22 Dr [name] Ov: annual - referred to PT. Refer for LE [place]. 02/21/21 - PCP - Patient call, Hold Invokana x 10 days to see if swelling improves 02/20/21 - PCP - Labs ordered no medication changes  Recent consult visits: 05/28/22 Dr Fletcher Anon (Cardiology): PAD  - ABI normal, pulses diminished. No limb ischemia. Re-challenge with atorvastatin 20 mg  04/17/22 admission - dupuytren contracture release procedure. 04/03/22 Dr Rudene Christians (Ortho surg): Dupuytrens contracture. Referred for injections. 12/06/21 Dr Honor Junes (Endocrine): f/u DM. A1c 9.0%. Fructosamine 330 ~A1c 7.2 06/06/21 Dr Honor Junes (Endocrine): f/u DM. Hold Invokana. A1c 9.3%, frucostamine [place] [facility] visits: None in previous 6 months   Objective:  Lab Results  Component Value Date   CREATININE 1.20 04/11/2022   BUN 14 04/11/2022   GFR 44.94 (L) 03/05/2021   GFRNONAA >60 04/11/2022   GFRAA >60 12/07/2019   NA 138 04/11/2022   K 4.2 04/11/2022   CALCIUM 8.8 (L) 04/11/2022   CO2 25 04/11/2022   GLUCOSE 189 (H) 04/11/2022    Lab Results  Component Value Date/Time   HGBA1C 9.0 12/06/2021 12:00 AM   HGBA1C 9.2 (H) 11/10/2020 09:34 AM   HGBA1C 9.0 03/30/2020 12:00 AM   GFR 44.94 (L) 03/05/2021 09:17 AM   GFR 45.31 (L) 02/20/2021 11:33 AM   MICROALBUR 5.6 (H) 02/28/2015 09:19 AM   MICROALBUR 5.9 (H) 02/19/2010 03:44 PM    Last diabetic Eye exam:  Lab Results  Component Value Date/Time   HMDIABEYEEXA No Retinopathy 07/11/2021 12:00 AM    Last diabetic Foot exam:  Lab Results  Component Value Date/Time   HMDIABFOOTEX done 03/19/2016 12:00 AM     Lab Results  Component Value Date   CHOL 184 12/06/2021   HDL 40 12/06/2021   LDLCALC 118 12/06/2021   LDLDIRECT 160.2 06/01/2012   TRIG 133 12/06/2021   CHOLHDL 5 11/10/2020       Latest Ref Rng & Units 02/20/2021   11:33 AM  11/10/2020    9:34 AM 12/07/2019    4:29 AM  Hepatic Function  Total Protein 6.0 - 8.3 g/dL 7.0  6.7  6.5   Albumin 3.5 - 5.2 g/dL 4.3  4.2  3.6   AST 0 - 37 U/L 15  18  19    ALT 0 - 53 U/L 17  19  20    Alk Phosphatase 39 - 117 U/L 60  58  58   Total Bilirubin 0.2 - 1.2 mg/dL 0.7  0.8  0.9     Lab Results  Component Value Date/Time   TSH 1.46 11/10/2020 09:34 AM   TSH 1.59 09/28/2018 10:39 AM        Latest Ref Rng & Units 04/11/2022    2:24 PM 02/20/2021   11:33 AM 11/10/2020    9:34 AM  CBC  WBC 4.0 - 10.5 K/uL 6.5  5.8  6.3   Hemoglobin 13.0 - 17.0 g/dL 12.4  13.6  13.8   Hematocrit 39.0 - 52.0 % 37.7  41.7  41.4   Platelets 150 - 400 K/uL 213  231.0  226.0     Lab Results  Component Value Date/Time   VD25OH 31 03/24/2009 09:37 PM    Clinical ASCVD: Yes  The 10-year ASCVD risk score (Arnett DK, et al., 2019) is: 43.3%*   Values used to calculate the score:     Age: 76 years     Sex: Male     Is Non-Hispanic African American: No     Diabetic: Yes     Tobacco smoker: No     Systolic Blood Pressure: 258 mmHg     Is BP treated: Yes     HDL Cholesterol: 40 mg/dL*     Total Cholesterol: 184 mg/dL*     * - Cholesterol units were assumed for this score calculation       10/31/2021    9:52 AM 10/27/2020    9:53 AM 10/01/2019    3:35 PM  Depression screen PHQ 2/9  Decreased Interest 0 0 0  Down, Depressed, Hopeless 0 0 0  PHQ - 2 Score 0 0 0  Altered sleeping  0 0  Tired, decreased energy  0 0  Change in appetite  0 0  Feeling bad or failure about yourself   0 0  Trouble concentrating  0 0  Moving slowly or fidgety/restless  0 0  Suicidal thoughts  0 0  PHQ-9 Score  0 0  Difficult doing work/chores  Not difficult at all Not difficult at all      Social History   Tobacco Use  Smoking Status Former   Packs/day: 0.50   Years: 40.00   Total pack years: 20.00   Types: Pipe, Cigarettes   Quit date: 06/09/2016   Years since quitting: 5.9  Smokeless Tobacco Never   BP Readings from Last 3 Encounters:  05/28/22 122/80  04/16/22 (!) 162/75  01/08/22 (!) 142/84   Pulse Readings from Last 3 Encounters:  05/28/22 83  04/16/22 88  01/08/22 71   Wt Readings from Last 3 Encounters:  05/28/22 187 lb 8 oz (85 kg)  04/11/22 195 lb (88.5 kg)  01/08/22 189 lb (85.7 kg)   BMI Readings from Last 3 Encounters:  05/28/22 28.51 kg/m  04/11/22 29.65 kg/m   01/08/22 28.74 kg/m    Assessment/Interventions: Review of patient past medical history, allergies, medications, health status, including review of consultants reports, laboratory and other test data, was performed as part of  comprehensive evaluation and provision of chronic care management services.   SDOH:  (Social Determinants of Health) assessments and interventions performed: No - recently performed 10/2021 AWV SDOH Interventions    Flowsheet Row Chronic Care Management from 03/06/2021 in [place] at [place] from 10/27/2020 in [place] at [place] Management from 09/05/2020 in [place] at [place] from 10/01/2019 in [place] at [place]  SDOH Interventions      Depression Interventions/Treatment  -- TKP5-4 Score <4 Follow-up Not Indicated -- PHQ2-9 Score <4 Follow-up Not Indicated  Financial Strain Interventions Intervention Not Indicated -- Intervention Not Indicated --      SDOH Screenings   Food Insecurity: No Food Insecurity (10/31/2021)  Housing: Low Risk  (10/31/2021)  Transportation Needs: No Transportation Needs (10/31/2021)  Alcohol Screen: Low Risk  (10/31/2021)  Depression (PHQ2-9): Low Risk  (10/31/2021)  Financial Resource Strain: Low Risk  (10/31/2021)  Physical Activity: Inactive (10/31/2021)  Social Connections: Socially Isolated (10/31/2021)  Stress: No Stress Concern Present (10/31/2021)  Tobacco Use: Medium Risk (05/28/2022)    CCM Care Plan  Allergies  Allergen Reactions   Bee Venom Anaphylaxis   Pravastatin Other (See Comments)    Severe myalgias   Ace Inhibitors     Cough   Actos [Pioglitazone] Other (See Comments)    Intolerant, not allergic.    Angiotensin Receptor Blockers Other (See Comments)    cramps   Aripiprazole Other (See Comments)    Unknown reaction many years ago   Crestor [Rosuvastatin Calcium] Other (See Comments)    Aches, even with twice  weekly dosing   Invokana [Canagliflozin]     Edema   Metformin And Related Other (See Comments)    Able to tolerate XR formulation, not allergic.    Nsaids     Would avoid given creatinine elevation.   Olanzapine Other (See Comments)    Unknown reaction many years ago   Pneumovax 23 [Pneumococcal Vac Polyvalent] Other (See Comments)    aches   Codeine Rash   Divalproex Sodium Other (See Comments)     tremor   Lithium Carbonate Other (See Comments)    Tremors    Sulfamethoxazole-Trimethoprim Other (See Comments)    Mild reaction per Dr. Silvio Pate, pt does not remember the reaction    Medications Reviewed Today     Reviewed by Charlton Haws, [facility] (Pharmacist) on 05/29/22 at 1159  Med List Status: <None>   Medication Order Taking? Sig Documenting Provider Last Dose Status Informant  ALPRAZolam (XANAX) 0.5 MG tablet [id number] Yes TAKE ONE TABLET BY MOUTH TWICE DAILY WITH AN EXTRA 1/2 TABLET IF NEEDED. MAY CAUSE DROWSINESS. [name], MD Taking Active   aspirin 81 MG EC tablet [id number] Yes Take 1 tablet (81 mg total) by mouth daily. For stroke prevention  Patient taking differently: Take 81 mg by mouth daily with lunch. For stroke prevention   [name], MD Taking Active   atorvastatin (LIPITOR) 20 MG tablet [id number] Yes Take 1 tablet (20 mg total) by mouth daily. Wellington Hampshire, MD Taking Active   BD PEN NEEDLE NANO U/F 32G X 4 MM MISC [id number] Yes USE AS DIRECTED [name], MD Taking Active   clopidogrel (PLAVIX) 75 MG tablet [id number] Yes TAKE 1 TABLET BY MOUTH DAILY [name], MD Taking Active   FLUoxetine (PROZAC) 10 MG capsule [id number] Yes Take 3 capsules (30 mg total) by mouth daily with lunch. TAKE 3  CAPSULES BY MOUTH DAILY FOR MOOD AS DIRECTED Poggi, Marshall Cork, MD Taking Active   glipiZIDE (GLUCOTROL XL) 10 MG 24 hr tablet [id number] Yes Take 1 tablet (10 mg total) by mouth daily with breakfast. For diabetes [name], MD Taking Active    insulin glargine (LANTUS SOLOSTAR) 100 UNIT/ML Solostar Pen [id number] Yes Inject 20 Units into the skin at bedtime. For diabetes Poggi, Marshall Cork, MD Taking Active   irbesartan (AVAPRO) 150 MG tablet [id number] Yes Take 1 tablet (150 mg total) by mouth daily. For blood pressure [name], MD Taking Active   metFORMIN (GLUCOPHAGE-XR) 500 MG 24 hr tablet [id number] Yes TAKE ONE TABLET 3 TIMES DAILY for diabetes [name], MD Taking Active   oxyCODONE-acetaminophen (PERCOCET/ROXICET) 5-325 MG tablet [id number] Yes TAKE ONE TO TWO TABLETS BY MOUTH 3 TIMESDAILY AS NEEDED FOR PAIN.  Fill on/after 06/01/22 [name], MD Taking Active   oxyCODONE-acetaminophen (PERCOCET/ROXICET) 5-325 MG tablet [id number] Yes TAKE ONE TO TWO TABLETS BY MOUTH 3 TIMES DAILY IF NEEDED FOR PAIN. Fill on/after 04/02/22 [name], MD Taking Active   primidone (MYSOLINE) 50 MG tablet [id number] Yes TAKE 1 TABLET BY MOUTH DAILY [name], MD Taking Active   propranolol ER (INDERAL LA) 60 MG 24 hr capsule [id number] Yes Take 1 capsule (60 mg total) by mouth at bedtime. Poggi, Marshall Cork, MD Taking Active   tiZANidine (ZANAFLEX) 4 MG tablet [id number] Yes TAKE ONE TABLET 3 TIMES DAILY AS NEEDED FOR MUSCLE SPASM MAY TAKE 1 EXTRADOSE DAILY AS NEEDED [name], MD Taking Active   traZODone (DESYREL) 50 MG tablet [id number] Yes Take 1 tablet (50 mg total) by mouth at bedtime. Poggi, Marshall Cork, MD Taking Active             Patient Active Problem List   Diagnosis Date Noted   Insomnia 01/09/2022   Decreased dorsalis pedis pulse 01/09/2022   Dupuytren contracture 02/21/2021   Lower extremity edema 02/21/2021   Left shoulder pain 11/11/2020   Statin myopathy 09/10/2020   Fall at home 08/09/2020   History of CVA in adulthood 12/07/2019   Gait abnormality 11/12/2019   Tremor, essential 11/12/2019   Health care maintenance 10/04/2018   Thumb pain 10/04/2018   Memory change 12/17/2017   Paresthesia  12/03/2017   Lightheaded 08/28/2017   Encounter for chronic pain management 06/18/2017   Neck pain 05/14/2017   Panic 01/10/2017   Muscle ache 10/05/2015   Syncope 07/08/2015   Chronic back pain 04/12/2015   Medicare annual wellness visit, initial 03/08/2015   Advance care planning 03/08/2015   Dysgeusia 11/28/2014   Anxiety state 10/27/2014   Diabetic polyneuropathy ([place]) 02/14/2014   Rhinitis, chronic 01/13/2012   Basal cell carcinoma    LUMBAR RADICULOPATHY 10/16/2010   HLD (hyperlipidemia) 05/13/2008   Uncontrolled type 2 diabetes mellitus with hyperglycemia ([place]) 05/26/2007   Essential hypertension 05/26/2007   SCOLIOSIS 05/26/2007    Immunization History  Administered Date(s) Administered   Influenza Split 07/10/2011, 05/28/2012   Influenza Whole 06/08/2008, 05/29/2009, 05/22/2010   Influenza, High Dose Seasonal PF 06/09/2018, 05/30/2020   Influenza,inj,Quad PF,6+ Mos 06/13/2017, 06/09/2018, 05/04/2019   Influenza-Unspecified 06/30/2015, 07/12/2016, 06/13/2017, 06/09/2018, 05/04/2019   Moderna Sars-Covid-2 Vaccination 07/21/2020   PFIZER(Purple Top)SARS-COV-2 Vaccination 11/04/2019, 12/02/2019   Pneumococcal Conjugate-13 09/09/2014   Pneumococcal Polysaccharide-23 02/19/2010, 01/03/2016   Td 11/24/2006   Tdap 12/13/2016   Zoster Recombinat (Shingrix) 04/22/2020, 06/27/2020   Zoster, Live 12/22/2009    Conditions to be  addressed/monitored:  Hypertension, Hyperlipidemia, Diabetes, Chronic Kidney Disease, and Anxiety, Hx CVA  Care Plan : [place] Plan  Updates made by Charlton Haws, [place] since 05/29/2022 12:00 AM     Problem: Hypertension, Hyperlipidemia, Diabetes, Chronic Kidney Disease, and Anxiety, Hx CVA   Priority: High     Long-Range Goal: Disease Management   Start Date: 03/06/2021  Expected End Date: 11/30/2022  Recent Progress: On track  Priority: High  Note:   Current Barriers:  None identified  Pharmacist Clinical Goal(s):  Patient  will contact provider office for questions/concerns as evidenced notation of same in electronic health record through collaboration with PharmD and provider.   Interventions: 1:1 collaboration with [name], MD regarding development and update of comprehensive plan of care as evidenced by provider attestation and co-signature Inter-disciplinary care team collaboration (see longitudinal plan of care) Comprehensive medication review performed; medication list updated in electronic medical record  Hypertension / CKD stage 3b (BP goal <140/90) -Controlled - per clinic BP -Current home readings: none reported -Denies hypotensive/hypertensive symptoms -Current treatment: Irbesartan 150 mg daily - Appropriate, Effective, Safe, Accessible Propranolol ER 60 mg daily (tremor) - Appropriate, Effective, Safe, Accessible -Medications previously tried: amlodipine - stopped due to orthostatic hypotension -Educated on BP goals and benefits of medications for prevention of heart attack, stroke and kidney damage; -Counseled to monitor BP at home with symptoms of dizziness -Recommended to continue current medication  Diabetes (A1c goal <8%) -Controlled - A1c 9.0% (11/2021), however fructosamine 330 ~correlates to A1c 7.2%; pt is wearing Dexcom -Follows with Endocrine (Dr Honor Junes) -Current home glucose readings Fasting glucose: 120-130 Post-prandial: <250 -Current medications: Glipizide 10 mg XL daily - Appropriate, Effective, Safe, Accessible Lantus Solostar 19 units daily -Appropriate, Effective, Safe, Accessible Metformin ER 500 mg TID -Appropriate, Effective, Safe, Accessible Dexcom G6- Appropriate, Effective, Safe, Accessible -Medications previously tried: glimepiride, Invokana (edema), pioglitazone -Reviewed A1c goal, blood sugar targets; pt denies hypoglycemia -Recommended to continue current medication; keep appts with endocrine  Hyperlipidemia: (LDL goal < 70) -Not ideally controlled  - LDL 118 (10/2021), pt has not tolerated multiple statins include low dose rosuvastatin; he has also failed zetia; recently cardiology prescribed atorvastatin 20 mg to re-challenge given PAD issues -Hx CVA, PAD; statin intolerant -Current treatment: Atorvastatin 20 mg daily - Appropriate, Query Effective Clopidogrel 75 mg daily -Appropriate, Effective, Safe, Accessible Aspirin 81 mg daily -Appropriate, Effective, Safe, Accessible -Medications previously tried: pravastatin, rosuvastatin (BIW), atorvastatin, ezetimibe -Educated on Cholesterol goals; Importance of limiting foods high in cholesterol; -Pt may be a candidate for Nexletol or PCSK9-inhibitor; will reassess after repeat lipid panel -Recommend to continue current medication; advised pt to contact cardiologist if he cannot tolerate atorvastatin  Anxiety / Panic (Goal: manage symptoms) -Controlled -PHQ9: 0 (10/2021) - minimal depression -GAD7: not on file -Connected with PCP for mental health support -Current treatment: Fluoxetine 10 mg - 3 cap daily - Appropriate, Effective, Safe, Accessible Alprazolam 0.5 mg BID -Appropriate, Effective, Query Safe Trazodone 50 mg daily -Appropriate, Effective, Safe, Accessible -Medications previously tried/failed: n/a -Educated on Benefits of medication for symptom control -Previously discussed sedation/fall risk with alprazolam especially in combination with oxycodone; pt denies oversedation or overuse of medications in general -Recommended to continue current medication  Essential tremor (Goal: manage symptoms) -Controlled -Current treatment  Propranolol ER 60 mg daily - Appropriate, Effective, Safe, Accessible Primidone 50 mg daily - Appropriate, Effective, Safe, Accessible -Medications previously tried: n/a  -Recommended to continue current medication  Patient Goals/Self-Care Activities Patient will:  -  check blood pressure with any dizziness, document, and provide at future  appointments -contact office with any health concerns       Medication Assistance: None required.  Patient affirms current coverage meets needs.  Compliance/Adherence/Medication fill history: Care Gaps: A1c (due 05/13/21) Foot exam (due 04/25/21)  Star-Rating Drugs: Glipizide - PDC 99% Irbesartan - PDC 100% Metformin - PDC 99%  Medication Access: Within the past 30 days, how often has patient missed a dose of medication? 0 Is a pillbox or other method used to improve adherence? Yes  Factors that may affect medication adherence? no barriers identified Are meds synced by current pharmacy? No  Are meds delivered by current pharmacy? Yes  Does patient experience delays in picking up medications due to transportation concerns? No   [facility] Reviewed: Is patient disadvantaged to use [facility]?: No  Current Rx insurance plan: HTA Name and location of Current pharmacy:  [place], [place] - [address] [place] [address] Phone: [phone] Fax: [phone]  [facility] services reviewed with patient today?: No  Patient requests to transfer care to [facility]?: No  Reason patient declined to change pharmacies: Loyalty to other pharmacy/Patient preference   Care Plan and Follow Up Patient Decision:  Patient agrees to Care Plan and Follow-up.  Plan: Telephone follow up appointment with care management team member scheduled for:  6 months  Charlene Brooke, PharmD, BCACP Clinical Pharmacist [facility] at [facility] [phone]

## 2022-06-17 ENCOUNTER — Other Ambulatory Visit: Payer: Self-pay | Admitting: Family Medicine

## 2022-07-02 ENCOUNTER — Ambulatory Visit: Payer: PPO | Attending: Cardiovascular Disease

## 2022-07-22 ENCOUNTER — Telehealth: Payer: Self-pay | Admitting: Family Medicine

## 2022-07-22 NOTE — Telephone Encounter (Signed)
LOV - 01/08/22 NOV - NA RF- 03/06/22 #180/0

## 2022-07-22 NOTE — Telephone Encounter (Signed)
  Encourage patient to contact the pharmacy for refills or they can request refills through [facility] [place]  Did the patient contact the pharmacy:  no   LAST APPOINTMENT DATE:  Please schedule appointment if longer than 1 year  NEXT APPOINTMENT DATE:  MEDICATION:oxyCODONE-acetaminophen (PERCOCET/ROXICET) 5-325 MG tablet   Is the patient out of medication? no  If not, how much is left?3 or 4 days left  Is this a 90 day supply: no  PHARMACY: [place], [place] Phone: [phone]  Fax: [phone]      Let patient know to contact pharmacy at the end of the day to make sure medication is ready.  Please notify patient to allow 48-72 hours to process

## 2022-07-23 MED ORDER — OXYCODONE-ACETAMINOPHEN 5-325 MG PO TABS: ORAL_TABLET | ORAL | 0 refills | Status: DC

## 2022-07-23 NOTE — Addendum Note (Signed)
Addended by: [name] on: 07/23/2022 08:06 AM   Modules accepted: Orders

## 2022-07-23 NOTE — Telephone Encounter (Signed)
Sent. Thanks.   

## 2022-08-30 ENCOUNTER — Ambulatory Visit: Payer: PPO | Attending: Cardiovascular Disease

## 2022-09-03 ENCOUNTER — Other Ambulatory Visit: Payer: Self-pay | Admitting: Family Medicine

## 2022-09-03 NOTE — Telephone Encounter (Signed)
Refill request for ALPRAZOLAM 0.5 MG TAB   LOV - 01/08/22 Next OV - not scheduled Last refill - 04/03/22 #75/2

## 2022-09-17 ENCOUNTER — Other Ambulatory Visit: Payer: Self-pay | Admitting: Family Medicine

## 2022-09-25 ENCOUNTER — Telehealth: Payer: Self-pay | Admitting: Family Medicine

## 2022-09-25 NOTE — Telephone Encounter (Signed)
LVM for patient to call back and schedule

## 2022-09-25 NOTE — Telephone Encounter (Signed)
Patient will be due for medicare wellness after feb. Please call to schedule appt.

## 2022-09-26 ENCOUNTER — Ambulatory Visit: Payer: PPO

## 2022-09-26 NOTE — Telephone Encounter (Signed)
Patient has been scheduled

## 2022-09-27 ENCOUNTER — Ambulatory Visit: Payer: PPO | Admitting: Cardiovascular Disease

## 2022-10-11 DIAGNOSIS — D225 Melanocytic nevi of trunk: Secondary | ICD-10-CM | POA: Diagnosis not present

## 2022-10-11 DIAGNOSIS — L570 Actinic keratosis: Secondary | ICD-10-CM | POA: Diagnosis not present

## 2022-10-11 DIAGNOSIS — D485 Neoplasm of uncertain behavior of skin: Secondary | ICD-10-CM | POA: Diagnosis not present

## 2022-10-11 DIAGNOSIS — D2271 Melanocytic nevi of right lower limb, including hip: Secondary | ICD-10-CM | POA: Diagnosis not present

## 2022-10-11 DIAGNOSIS — D2262 Melanocytic nevi of left upper limb, including shoulder: Secondary | ICD-10-CM | POA: Diagnosis not present

## 2022-10-11 DIAGNOSIS — D044 Carcinoma in situ of skin of scalp and neck: Secondary | ICD-10-CM | POA: Diagnosis not present

## 2022-10-11 DIAGNOSIS — D2261 Melanocytic nevi of right upper limb, including shoulder: Secondary | ICD-10-CM | POA: Diagnosis not present

## 2022-10-11 DIAGNOSIS — D2272 Melanocytic nevi of left lower limb, including hip: Secondary | ICD-10-CM | POA: Diagnosis not present

## 2022-10-17 DIAGNOSIS — D044 Carcinoma in situ of skin of scalp and neck: Secondary | ICD-10-CM | POA: Diagnosis not present

## 2022-10-31 NOTE — Patient Instructions (Signed)
Walter Harris , Thank you for taking time to come for your Medicare Wellness Visit. I appreciate your ongoing commitment to your health goals. Please review the following plan we discussed and let me know if I can assist you in the future.   These are the goals we discussed:  Goals      Patient Stated     Would like to maintain current routine        This is a list of the screening recommended for you and due dates:  Health Maintenance  Topic Date Due   Screening for Lung Cancer  Never done   Yearly kidney health urinalysis for diabetes  02/28/2016   Flu Shot  04/09/2022   Colon Cancer Screening  04/14/2022   COVID-19 Vaccine (4 - 2023-24 season) 05/10/2022   Hemoglobin A1C  06/08/2022   Eye exam for diabetics  07/11/2022   Complete foot exam   01/09/2023   Yearly kidney function blood test for diabetes  04/12/2023   Medicare Annual Wellness Visit  11/02/2023   DTaP/Tdap/Td vaccine (3 - Td or Tdap) 12/14/2026   Pneumonia Vaccine  Completed   Hepatitis C Screening: USPSTF Recommendation to screen - Ages 98-79 yo.  Completed   Zoster (Shingles) Vaccine  Completed   HPV Vaccine  Aged Out    Advanced directives: Please bring a copy of your health care power of attorney and living will to the office to be added to your chart at your convenience.   Conditions/risks identified: Aim for 30 minutes of exercise or brisk walking, 6-8 glasses of water, and 5 servings of fruits and vegetables each day.   Next appointment: Follow up in one year for your annual wellness visit.   Preventive Care 20 Years and Older, Male  Preventive care refers to lifestyle choices and visits with your health care provider that can promote health and wellness. What does preventive care include? A yearly physical exam. This is also called an annual well check. Dental exams once or twice a year. Routine eye exams. Ask your health care provider how often you should have your eyes checked. Personal lifestyle  choices, including: Daily care of your teeth and gums. Regular physical activity. Eating a healthy diet. Avoiding tobacco and drug use. Limiting alcohol use. Practicing safe sex. Taking low doses of aspirin every day. Taking vitamin and mineral supplements as recommended by your health care provider. What happens during an annual well check? The services and screenings done by your health care provider during your annual well check will depend on your age, overall health, lifestyle risk factors, and family history of disease. Counseling  Your health care provider may ask you questions about your: Alcohol use. Tobacco use. Drug use. Emotional well-being. Home and relationship well-being. Sexual activity. Eating habits. History of falls. Memory and ability to understand (cognition). Work and work Statistician. Screening  You may have the following tests or measurements: Height, weight, and BMI. Blood pressure. Lipid and cholesterol levels. These may be checked every 5 years, or more frequently if you are over 32 years old. Skin check. Lung cancer screening. You may have this screening every year starting at age 40 if you have a 30-pack-year history of smoking and currently smoke or have quit within the past 15 years. Fecal occult blood test (FOBT) of the stool. You may have this test every year starting at age 73. Flexible sigmoidoscopy or colonoscopy. You may have a sigmoidoscopy every 5 years or a colonoscopy every 10 years  starting at age 62. Prostate cancer screening. Recommendations will vary depending on your family history and other risks. Hepatitis C blood test. Hepatitis B blood test. Sexually transmitted disease (STD) testing. Diabetes screening. This is done by checking your blood sugar (glucose) after you have not eaten for a while (fasting). You may have this done every 1-3 years. Abdominal aortic aneurysm (AAA) screening. You may need this if you are a current or  former smoker. Osteoporosis. You may be screened starting at age 85 if you are at high risk. Talk with your health care provider about your test results, treatment options, and if necessary, the need for more tests. Vaccines  Your health care provider may recommend certain vaccines, such as: Influenza vaccine. This is recommended every year. Tetanus, diphtheria, and acellular pertussis (Tdap, Td) vaccine. You may need a Td booster every 10 years. Zoster vaccine. You may need this after age 65. Pneumococcal 13-valent conjugate (PCV13) vaccine. One dose is recommended after age 67. Pneumococcal polysaccharide (PPSV23) vaccine. One dose is recommended after age 43. Talk to your health care provider about which screenings and vaccines you need and how often you need them. This information is not intended to replace advice given to you by your health care provider. Make sure you discuss any questions you have with your health care provider. Document Released: 09/22/2015 Document Revised: 05/15/2016 Document Reviewed: 06/27/2015 Elsevier Interactive Patient Education  2017 [employer] Prevention in the Home Falls can cause injuries. They can happen to people of all ages. There are many things you can do to make your home safe and to help prevent falls. What can I do on the outside of my home? Regularly fix the edges of walkways and driveways and fix any cracks. Remove anything that might make you trip as you walk through a door, such as a raised step or threshold. Trim any bushes or trees on the path to your home. Use bright outdoor lighting. Clear any walking paths of anything that might make someone trip, such as rocks or tools. Regularly check to see if handrails are loose or broken. Make sure that both sides of any steps have handrails. Any raised decks and porches should have guardrails on the edges. Have any leaves, snow, or ice cleared regularly. Use sand or salt on walking paths  during winter. Clean up any spills in your garage right away. This includes oil or grease spills. What can I do in the bathroom? Use night lights. Install grab bars by the toilet and in the tub and shower. Do not use towel bars as grab bars. Use non-skid mats or decals in the tub or shower. If you need to sit down in the shower, use a plastic, non-slip stool. Keep the floor dry. Clean up any water that spills on the floor as soon as it happens. Remove soap buildup in the tub or shower regularly. Attach bath mats securely with double-sided non-slip rug tape. Do not have throw rugs and other things on the floor that can make you trip. What can I do in the bedroom? Use night lights. Make sure that you have a light by your bed that is easy to reach. Do not use any sheets or blankets that are too big for your bed. They should not hang down onto the floor. Have a firm chair that has side arms. You can use this for support while you get dressed. Do not have throw rugs and other things on the floor that  can make you trip. What can I do in the kitchen? Clean up any spills right away. Avoid walking on wet floors. Keep items that you use a lot in easy-to-reach places. If you need to reach something above you, use a strong step stool that has a grab bar. Keep electrical cords out of the way. Do not use floor polish or wax that makes floors slippery. If you must use wax, use non-skid floor wax. Do not have throw rugs and other things on the floor that can make you trip. What can I do with my stairs? Do not leave any items on the stairs. Make sure that there are handrails on both sides of the stairs and use them. Fix handrails that are broken or loose. Make sure that handrails are as long as the stairways. Check any carpeting to make sure that it is firmly attached to the stairs. Fix any carpet that is loose or worn. Avoid having throw rugs at the top or bottom of the stairs. If you do have throw  rugs, attach them to the floor with carpet tape. Make sure that you have a light switch at the top of the stairs and the bottom of the stairs. If you do not have them, ask someone to add them for you. What else can I do to help prevent falls? Wear shoes that: Do not have high heels. Have rubber bottoms. Are comfortable and fit you well. Are closed at the toe. Do not wear sandals. If you use a stepladder: Make sure that it is fully opened. Do not climb a closed stepladder. Make sure that both sides of the stepladder are locked into place. Ask someone to hold it for you, if possible. Clearly mark and make sure that you can see: Any grab bars or handrails. First and last steps. Where the edge of each step is. Use tools that help you move around (mobility aids) if they are needed. These include: Canes. Walkers. Scooters. Crutches. Turn on the lights when you go into a dark area. Replace any light bulbs as soon as they burn out. Set up your furniture so you have a clear path. Avoid moving your furniture around. If any of your floors are uneven, fix them. If there are any pets around you, be aware of where they are. Review your medicines with your doctor. Some medicines can make you feel dizzy. This can increase your chance of falling. Ask your doctor what other things that you can do to help prevent falls. This information is not intended to replace advice given to you by your health care provider. Make sure you discuss any questions you have with your health care provider. Document Released: 06/22/2009 Document Revised: 02/01/2016 Document Reviewed: 09/30/2014 Elsevier Interactive Patient Education  2017 [employer].

## 2022-10-31 NOTE — Progress Notes (Signed)
Subjective:   Walter Harris is a 77 y.o. male who presents for Medicare Annual/Subsequent preventive examination.  I connected with  Walter Harris on 11/01/22 by a audio enabled telemedicine application and verified that I am speaking with the correct person using two identifiers.  Patient Location: Home  Provider Location: Home Office  I discussed the limitations of evaluation and management by telemedicine. The patient expressed understanding and agreed to proceed.  Review of Systems     Cardiac Risk Factors include: advanced age (>16mn, >>4women);diabetes mellitus;dyslipidemia;hypertension;male gender;sedentary lifestyle;obesity (BMI >30kg/m2)     Objective:    Today's Vitals   11/01/22 1006  Weight: 187 lb (84.8 kg)  Height: '5\' 8"'$  (1.727 m)   Body mass index is 28.43 kg/m.     11/01/2022   10:14 AM 04/16/2022   12:14 PM 04/11/2022    9:14 AM 10/31/2021    9:50 AM 10/27/2020    9:50 AM 05/29/2020   11:19 AM 01/04/2020    2:42 PM  Advanced Directives  Does Patient Have a Medical Advance Directive? Yes Yes Yes Yes Yes Yes Yes  Type of Advance Directive Living will;Healthcare Power of [place]Living will H[place]Living will HDeS[place]Living will HSun ValleyLiving will HBenedictLiving will HApple CreekLiving will  Does patient want to make changes to medical advance directive? No - Patient declined No - Patient declined  Yes (MAU/Ambulatory/Procedural Areas - Information given)  No - Patient declined   Copy of HStilwellin Chart? No - copy requested No - copy requested No - copy requested  No - copy requested No - copy requested     Current Medications (verified) Outpatient Encounter Medications as of 11/01/2022  Medication Sig   ALPRAZolam (XANAX) 0.5 MG tablet TAKE ONE TABLET BY MOUTH TWICE DAILY WITH AN EXTRA 1/2 TABLET IF NEEDED.  MAY CAUSE DROWSINESS.   aspirin 81 MG EC tablet Take 1 tablet (81 mg total) by mouth daily. For stroke prevention (Patient taking differently: Take 81 mg by mouth daily with lunch. For stroke prevention)   atorvastatin (LIPITOR) 20 MG tablet Take 1 tablet (20 mg total) by mouth daily.   BD PEN NEEDLE NANO U/F 32G X 4 MM MISC USE AS DIRECTED   clopidogrel (PLAVIX) 75 MG tablet TAKE 1 TABLET BY MOUTH DAILY   FLUoxetine (PROZAC) 10 MG capsule Take 3 capsules (30 mg total) by mouth daily with lunch. TAKE 3 CAPSULES BY MOUTH DAILY FOR MOOD AS DIRECTED   glipiZIDE (GLUCOTROL XL) 10 MG 24 hr tablet Take 1 tablet (10 mg total) by mouth daily with breakfast. For diabetes   insulin glargine (LANTUS SOLOSTAR) 100 UNIT/ML Solostar Pen Inject 20 Units into the skin at bedtime. For diabetes   irbesartan (AVAPRO) 150 MG tablet Take 1 tablet (150 mg total) by mouth daily. For blood pressure   metFORMIN (GLUCOPHAGE-XR) 500 MG 24 hr tablet TAKE ONE TABLET 3 TIMES DAILY for diabetes   oxyCODONE-acetaminophen (PERCOCET/ROXICET) 5-325 MG tablet TAKE ONE TO TWO TABLETS BY MOUTH 3 TIMESDAILY AS NEEDED FOR PAIN.  Fill on/after 09/21/22   oxyCODONE-acetaminophen (PERCOCET/ROXICET) 5-325 MG tablet TAKE 1-2 TABLETS BY MOUTH 3 TIMES DAILY AS NEEDED FOR PAIN.   oxyCODONE-acetaminophen (PERCOCET/ROXICET) 5-325 MG tablet TAKE ONE TO TWO TABLETS BY MOUTH 3 TIMES DAILY IF NEEDED FOR PAIN.   primidone (MYSOLINE) 50 MG tablet TAKE 1 TABLET BY MOUTH DAILY   propranolol  ER (INDERAL LA) 60 MG 24 hr capsule TAKE 1 CAPSULE BY MOUTH ONCE DAILY   tiZANidine (ZANAFLEX) 4 MG tablet TAKE ONE TABLET 3 TIMES DAILY AS NEEDED FOR MUSCLE SPASM MAY TAKE 1 EXTRADOSE DAILY AS NEEDED   traZODone (DESYREL) 50 MG tablet TAKE 1 AND 1/2 TABLETS BY MOUTH AT BEDTIME AS NEEDED FOR SLEEP   No facility-administered encounter medications on file as of 11/01/2022.    Allergies (verified) Bee venom, Pravastatin, Ace inhibitors, Actos [pioglitazone], Angiotensin  receptor blockers, Aripiprazole, Crestor [rosuvastatin calcium], Invokana [canagliflozin], Metformin and related, Nsaids, Olanzapine, Pneumovax 23 [pneumococcal vac polyvalent], Codeine, Divalproex sodium, Lithium carbonate, and Sulfamethoxazole-trimethoprim   History: Past Medical History:  Diagnosis Date   Basal cell carcinoma    multiple   Cataracts, both eyes 2019   surgery pending April [id number]   Complication of anesthesia    post-operative cognitive dysfunction after ACDF on 05/01/11 [facility])   Deaf    right ear   Diabetes mellitus    DJD (degenerative joint disease)    lumbar spine   Gait abnormality 11/12/2019   Hearing impaired    hearing aids-transmitter rt    History of blood transfusion    as a baby in [birth year]   History of colonic polyps    History of kidney stones    Hyperlipidemia    Hypertension    pt. denies high blood pressure   Memory change 12/17/2017   Nose fracture    3x   OCD (obsessive compulsive disorder)    Stroke ([place]) 12/06/2019   "mini stroke, that's what gave me the tremors"   Stuttering    Tremor, essential    Vertigo    random   Wears dentures    full upper   Wears glasses    Past Surgical History:  Procedure Laterality Date   APPENDECTOMY  1960   BACK SURGERY  2012   lumbar surgery   CATARACT EXTRACTION W/PHACO Right 04/12/2019   Procedure: CATARACT EXTRACTION PHACO AND INTRAOCULAR LENS PLACEMENT ([place]) RIGHT, DIABETIC;  Surgeon: [name], MD;  Location: [place];  Service: Ophthalmology;  Laterality: Right;  Diabetic - insulin and oral meds   CATARACT EXTRACTION W/PHACO Left 05/10/2019   Procedure: CATARACT EXTRACTION PHACO AND INTRAOCULAR LENS PLACEMENT ([facility])  LEFT DIABETIC  00:25.6  14.4%  3.75;  Surgeon: [name], MD;  Location: [place];  Service: Ophthalmology;  Laterality: Left;  Diabetic - insulin and oral meds   CERVICAL FUSION  1990   CERVICAL FUSION  8/12   multiple levels with plates    CHOLESTEATOMA EXCISION  1993   COLLATERAL LIGAMENT REPAIR, ELBOW     bilateral, nerve improvement left 08/04, right 09/04   COLONOSCOPY     CYST EXCISION     back of cervical spine total of 5 surgeries   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   Procedure: DUPUYTREN CONTRACTURE RELEASE;  Surgeon: Corky Mull, MD;  Location: [facility];  Service: Orthopedics;  Laterality: Left;   EXTERNAL EAR SURGERY     multiple ear surguries as a child   OTHER SURGICAL HISTORY     benign head tumor#5   OTHER SURGICAL HISTORY     sebaceous cysts-post neck x5   SHOULDER ARTHROSCOPY W/ ACROMIAL REPAIR  08/2004   left shoulder   TONSILLECTOMY     ULNAR NERVE TRANSPOSITION Right 05/13/2013   Procedure: RIGHT ULNAR NERVE DECOMPRESSION/TRANSPOSITION;  Surgeon: [name]., MD;  Location: [place];  Service: Orthopedics;  Laterality: Right;   Family History  Problem Relation Age of Onset   OCD Mother    Bipolar disorder Mother    Cancer Mother    Emphysema Mother        never smoker, 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   Glaucoma Father    Arthritis Sister    OCD Sister    Colon cancer Neg Hx    Prostate cancer Neg Hx    Social History   Socioeconomic History   Marital status: Married    Spouse name: [name]   Number of children: 1   Years of education: Not on file   Highest education level: Not on file  Occupational History   Occupation: disabled    Employer: RETIRED    Comment: Dealer  Tobacco Use   Smoking status: Former    Packs/day: 0.50    Years: 40.00    Total pack years: 20.00    Types: Pipe, Cigarettes    Quit date: 06/09/2016    Years since quitting: 6.4   Smokeless tobacco: Never  Vaping Use   Vaping Use: Never used  Substance and Sexual Activity   Alcohol use: No    Alcohol/week: 0.0 standard drinks of alcohol   Drug use: No   Sexual activity: Not Currently  Other Topics Concern   Not on file  Social History Narrative   Married, 1992   1 biological  child, some contact   Wife has 2 kids, no contact as of 2017   Prev worked as Designer, television/film set, Lobbyist, etc   Caffeine use: 1/2 cup every morning- coffee   Right handed    Social Determinants of Health   Financial Resource Strain: [place]  (11/01/2022)   Overall Financial Resource Strain (CARDIA)    Difficulty of Paying Living Expenses: Not hard at all  Food Insecurity: No Food Insecurity (11/01/2022)   Hunger Vital Sign    Worried About Running Out of Food in the Last Year: Never true    [place] in the Last Year: Never true  Transportation Needs: No Transportation Needs (11/01/2022)   PRAPARE - Hydrologist (Medical): No    Lack of Transportation (Non-Medical): No  Physical Activity: Inactive (11/01/2022)   Exercise Vital Sign    Days of Exercise per Week: 0 days    Minutes of Exercise per Session: 0 min  Stress: No Stress Concern Present (11/01/2022)   [place]    Feeling of Stress : Not at all  Social Connections: Socially Isolated (11/01/2022)   Social Connection and Isolation Panel [NHANES]    Frequency of Communication with Friends and Family: Once a week    Frequency of Social Gatherings with Friends and Family: Never    Attends Religious Services: Never    Marine scientist or Organizations: No    Attends Music therapist: Never    Marital Status: Married    Tobacco Counseling Counseling given: Not Answered   Clinical Intake:  Pre-visit preparation completed: Yes  Pain : No/denies pain  Diabetes: Yes CBG done?: No Did pt. bring in CBG monitor from home?: No  How often do you need to have someone help you when you read instructions, pamphlets, or other written materials from your doctor or pharmacy?: 2 - Rarely  Diabetic?Yes   Nutrition Risk Assessment:  Has the patient had any N/V/D within the last 2 months?  No  Does the  patient have any non-healing wounds?  No  Has the patient had any unintentional weight loss or weight gain?  No   Diabetes:  Is the patient diabetic?  Yes  If diabetic, was a CBG obtained today?  No  Did the patient bring in their glucometer from home?  No  How often do you monitor your CBG's? Daily .   Financial Strains and Diabetes Management:  Are you having any financial strains with the device, your supplies or your medication? No .  Does the patient want to be seen by Chronic Care Management for management of their diabetes?  No  Would the patient like to be referred to a Nutritionist or for Diabetic Management?  No   Diabetic Exams:  Diabetic Eye Exam: Completed will request records  Diabetic Foot Exam: Completed 01/08/22   Interpreter Needed?: No  Comments: Assited in visit by wife Information entered by :: Denman George LPN   Activities of Daily Living    11/01/2022   10:14 AM 04/11/2022    9:16 AM  In your present state of health, do you have any difficulty performing the following activities:  Hearing? 0   Vision? 0   Difficulty concentrating or making decisions? 0   Walking or climbing stairs? 0   Dressing or bathing? 0   Doing errands, shopping? 0 1  Preparing Food and eating ? N   Using the Toilet? N   In the past six months, have you accidently leaked urine? N   Do you have problems with loss of bowel control? N   Managing your Medications? N   Managing your Finances? N   Housekeeping or managing your Housekeeping? N     Patient Care Team: [name], MD as PCP - General (Family Medicine) Kary Kos, MD as Consulting Physician (Neurosurgery) Dingeldein, Remo Lipps, MD as Referring Physician (Ophthalmology) Oneta Rack, MD as Referring Physician (Dermatology) Kary Kos, MD as Consulting Physician (Neurosurgery) Charlton Haws, [facility] as Pharmacist (Pharmacist) [facility], [facility]) Lonia Farber, MD as Consulting  Physician (Internal Medicine) Wellington [place], MD as Consulting Physician (Cardiology)  Indicate any recent Medical Services you may have received from other than [facility] providers in the past year (date may be approximate).     Assessment:   This is a routine wellness examination for [place].  Hearing/Vision screen Hearing Screening - Comments:: Hard of hearing; wears bilateral hearing aids  Vision Screening - Comments:: Wears rx glasses - up to date with routine eye exams with [facility]    Dietary issues and exercise activities discussed: Current Exercise Habits: The patient does not participate in regular exercise at present   Goals Addressed             This Visit's Progress    COMPLETED: medication adherence       Starting 09/28/2018, I will continue to take medications as prescribed in an effort to manage my health conditions.      COMPLETED: Patient Stated       10/01/2019, I will continue to take medications as prescribed in an effort to manage my health conditions.     COMPLETED: Patient Stated       10/27/2020, I will maintain and continue medications as prescribed.        Depression Screen    11/01/2022   10:12 AM 10/31/2021    9:52 AM 10/27/2020    9:53 AM 10/01/2019    3:35  PM 09/28/2018   10:12 AM 09/25/2017    1:03 PM 03/14/2016   10:46 AM  PHQ 2/9 Scores  PHQ - 2 Score 0 0 0 0 0 0 0  PHQ- 9 Score   0 0 0 0     Fall Risk    11/01/2022   10:08 AM 10/31/2021    9:51 AM 10/27/2020    9:52 AM 10/01/2019    3:33 PM 09/28/2018   10:12 AM  Fall Risk   Falls in the past year? 0 0 '1 1 1  '$ Comment    balance issues multiple falls due to vertigo; unable to specify quanity  Number falls in past yr: 0 0 0 1 1  Injury with Fall? 0 0 0 0 0  Risk for fall due to : History of fall(s);Impaired balance/gait Impaired balance/gait Impaired balance/gait;Medication side effect Impaired balance/gait;Medication side effect;History of fall(s)   Follow up Falls prevention  discussed;Education provided;Falls evaluation completed Falls prevention discussed Falls evaluation completed;Falls prevention discussed Falls evaluation completed;Falls prevention discussed     FALL RISK PREVENTION PERTAINING TO THE HOME:  Any stairs in or around the home? No  If so, are there any without handrails? No  Home free of loose throw rugs in walkways, pet beds, electrical cords, etc? Yes  Adequate lighting in your home to reduce risk of falls? Yes   ASSISTIVE DEVICES UTILIZED TO PREVENT FALLS:  Life alert? No  Use of a cane, walker or w/c? Yes  Grab bars in the bathroom? Yes  Shower chair or bench in shower? No  Elevated toilet seat or a handicapped toilet? Yes   TIMED UP AND GO:  Was the test performed? No . Telephonic visit   Cognitive Function:    10/27/2020   10:04 AM 11/12/2019    9:52 AM 10/01/2019    3:38 PM 09/28/2018   10:26 AM 12/17/2017    2:36 PM  MMSE - Mini Mental State Exam  Not completed: Unable to complete      Orientation to time  '5 5 5 4  '$ Orientation to Place  '5 5 5 5  '$ Registration  '3 3 3 3  '$ Attention/ Calculation  3 5 0 5  Recall  '3 3 2 3  '$ Recall-comments    unable to recall 1 of 3 words   Language- name 2 objects  2  0 2  Language- repeat  '1 1 1 1  '$ Language- follow 3 step command  '3  3 3  '$ Language- read & follow direction  1  0 1  Write a sentence  0  0 1  Copy design  0  0 1  Total score  '26  19 29        '$ 11/01/2022   10:14 AM  6CIT Screen  What Year? 0 points  What month? 0 points  What time? 0 points  Count back from 20 0 points  Months in reverse 2 points  Repeat phrase 4 points  Total Score 6 points    Immunizations Immunization History  Administered Date(s) Administered   Influenza Split 07/10/2011, 05/28/2012   Influenza Whole 06/08/2008, 05/29/2009, 05/22/2010   Influenza, High Dose Seasonal PF 06/09/2018, 05/30/2020   Influenza,inj,Quad PF,6+ Mos 06/13/2017, 06/09/2018, 05/04/2019   Influenza-Unspecified 06/30/2015,  07/12/2016, 06/13/2017, 06/09/2018, 05/04/2019   Moderna Sars-Covid-2 Vaccination 07/21/2020   PFIZER(Purple Top)SARS-COV-2 Vaccination 11/04/2019, 12/02/2019   Pneumococcal Conjugate-13 09/09/2014   Pneumococcal Polysaccharide-23 02/19/2010, 01/03/2016   Td 11/24/2006   Tdap 12/13/2016  Zoster Recombinat (Shingrix) 04/22/2020, 06/27/2020   Zoster, Live 12/22/2009    TDAP status: Up to date  Flu Vaccine status: Due, Education has been provided regarding the importance of this vaccine. Advised may receive this vaccine at local pharmacy or Health Dept. Aware to provide a copy of the vaccination record if obtained from local pharmacy or Health Dept. Verbalized acceptance and understanding.  Pneumococcal vaccine status: Up to date  Covid-19 vaccine status: Information provided on how to obtain vaccines.   Qualifies for Shingles Vaccine? Yes   Zostavax completed No   Shingrix Completed?: Yes  Screening Tests Health Maintenance  Topic Date Due   Lung Cancer Screening  Never done   Diabetic kidney evaluation - Urine ACR  02/28/2016   INFLUENZA VACCINE  04/09/2022   COLONOSCOPY (Pts 45-67yr Insurance coverage will need to be confirmed)  04/14/2022   COVID-19 Vaccine (4 - 2023-24 season) 05/10/2022   HEMOGLOBIN A1C  06/08/2022   OPHTHALMOLOGY EXAM  07/11/2022   FOOT EXAM  01/09/2023   Diabetic kidney evaluation - eGFR measurement  04/12/2023   Medicare Annual Wellness (AWV)  11/02/2023   DTaP/Tdap/Td (3 - Td or Tdap) 12/14/2026   Pneumonia Vaccine 77 Years old  Completed   Hepatitis C Screening  Completed   Zoster Vaccines- Shingrix  Completed   HPV VACCINES  Aged Out    Health Maintenance  Health Maintenance Due  Topic Date Due   Lung Cancer Screening  Never done   Diabetic kidney evaluation - Urine ACR  02/28/2016   INFLUENZA VACCINE  04/09/2022   COLONOSCOPY (Pts 45-478yrInsurance coverage will need to be confirmed)  04/14/2022   COVID-19 Vaccine (4 - 2023-24 season)  05/10/2022   HEMOGLOBIN A1C  06/08/2022   OPHTHALMOLOGY EXAM  07/11/2022    Colorectal cancer screening: No longer required.   Lung Cancer Screening: (Low Dose CT Chest recommended if Age 76-80ears, 30 pack-year currently smoking OR have quit w/in 15years.) does qualify.   Lung Cancer Screening Referral: will discuss with provider   Additional Screening:  Hepatitis C Screening: does qualify; Completed 10/04/15  Vision Screening: Recommended annual ophthalmology exams for early detection of glaucoma and other disorders of the eye. Is the patient up to date with their annual eye exam?  Yes  Who is the provider or what is the name of the office in which the patient attends annual eye exams[facility] pt is not established with a provider, would they like to be referred to a provider to establish care? No .   Dental Screening: Recommended annual dental exams for proper oral hygiene  Community Resource Referral / Chronic Care Management: CRR required this visit?  No   CCM required this visit?  No      Plan:     I have personally reviewed and noted the following in the patient's chart:   Medical and social history Use of alcohol, tobacco or illicit drugs  Current medications and supplements including opioid prescriptions. Patient is currently taking opioid prescriptions. Information provided to patient regarding non-opioid alternatives. Patient advised to discuss non-opioid treatment plan with their provider. Functional ability and status Nutritional status Physical activity Advanced directives List of other physicians Hospitalizations, surgeries, and ER visits in previous 12 months Vitals Screenings to include cognitive, depression, and falls Referrals and appointments  In addition, I have reviewed and discussed with patient certain preventive protocols, quality metrics, and best practice recommendations. A written personalized care plan for preventive  services  as well as general preventive health recommendations were provided to patient.     Denman George [place], [place]   [id number]   Due to this being a virtual visit, the after visit summary with patients personalized plan was offered to patient via mail or my-chart. Patient would like to access on my-chart  Nurse Notes: No concerns

## 2022-11-01 ENCOUNTER — Ambulatory Visit: Payer: PPO | Attending: Cardiovascular Disease

## 2022-11-01 ENCOUNTER — Ambulatory Visit (INDEPENDENT_AMBULATORY_CARE_PROVIDER_SITE_OTHER): Payer: PPO

## 2022-11-01 VITALS — Ht 68.0 in | Wt 187.0 lb

## 2022-11-01 DIAGNOSIS — Z0000 Encounter for general adult medical examination without abnormal findings: Secondary | ICD-10-CM | POA: Diagnosis not present

## 2022-11-12 ENCOUNTER — Other Ambulatory Visit: Payer: Self-pay

## 2022-11-12 NOTE — Telephone Encounter (Signed)
Received a paper refill from [facility].  Looks like the last time we sent in a prescription was 05/17/20.  Pt's last OV was 01/08/22.  Please fill is appropriate.

## 2022-11-13 NOTE — Telephone Encounter (Signed)
LMTCB

## 2022-11-13 NOTE — Telephone Encounter (Signed)
Please verify use with patient and let me know.   He has f/u pending for May.   Thanks.

## 2022-11-14 NOTE — Telephone Encounter (Signed)
Patient wife called in returning call they received.

## 2022-11-14 NOTE — Telephone Encounter (Signed)
Spoke with patients wife and he is not taking this medication. Refill has been denied and taken off medication list.

## 2022-11-19 ENCOUNTER — Ambulatory Visit: Payer: PPO

## 2022-11-26 ENCOUNTER — Telehealth: Payer: Self-pay

## 2022-11-26 NOTE — Progress Notes (Signed)
Care Management & Coordination Services Pharmacy Team  Reason for Encounter: Appointment Reminder  Contacted patient to confirm telephone appointment with Charlene Brooke , PharmD on 11/29/22 at 11:00. Unsuccessful outreach. Left voicemail for patient to return call.   Have you seen any other providers since your last visit with PCP? No    Hospital visits:  None in previous 6 months   Star Rating Drugs:  Medication:  Last Fill: Day Supply Atorvastatin 20mg  05/28/22 30 Glipizide 10mg  10/17/22  90 Metformin 500mg  07/08/22 90 Lantus   10/21/22 30   Care Gaps: Annual wellness visit in last year? Yes  If Diabetic: Last eye exam / retinopathy screening:2022 Last diabetic foot exam:UTD   Charlene Brooke, PharmD notified  Avel Sensor, [place] Assistant [phone]

## 2022-11-29 ENCOUNTER — Ambulatory Visit: Payer: PPO | Admitting: Pharmacist

## 2022-11-29 NOTE — Patient Instructions (Signed)
Visit Information  Phone number for Pharmacist: [phone]  Thank you for meeting with me to discuss your medications! Below is a summary of what we talked about during the visit:   Recommendations/Changes made from today's visit: -No med changes -Advised to discuss cholesterol management at upcoming appt with Dr Fletcher Anon -Advised to schedule f/u appt with Endocrine (Dr Honor Junes) - gave office number  Follow up plan: -[place] will call patient 3 months for DM update -Pharmacist follow up PRN -Cardiology appt 12/06/22; PCP appt 01/13/23   Charlene Brooke, PharmD, BCACP Clinical Pharmacist [facility] [phone]

## 2022-11-29 NOTE — Progress Notes (Signed)
Care Management & Coordination Services Pharmacy Note  11/29/2022 Name:  Walter Harris MRN:  [id number] DOB:  July 14, 1946  Summary: F/U visit -HLD/PAD: LDL 118 (11/2021), pt is not on a statin (he recently failed re-challenge with atorvastatin 20 mg in the fall); he has upcoming appt with cardiology -DM: A1c 9.0 (11/2021) above goal; pt wears Dexcom and reports glucose 120-250 range; he denies low sugars; he follows with endocrine but has not had an appt in a year  Recommendations/Changes made from today's visit: -No med changes -Advised to discuss cholesterol management at upcoming appt with Dr Fletcher Anon - consider PCSK9-inhibitor or Nexletol -Advised to schedule f/u appt with Endocrine (Dr Honor Junes) - gave office number  Follow up plan: -[facility] will call patient 3 months for DM update -Pharmacist follow up PRN -Cardiology appt 12/06/22; PCP appt 01/13/23     Subjective: Walter Harris is an 77 y.o. year old male who is a primary patient of [name], Elveria Rising, MD.  The care coordination team was consulted for assistance with disease management and care coordination needs.    Engaged with patient by telephone for follow up visit.  Recent office visits: 01/08/22 Dr [name] Ov: annual - referred to PT. Refer for LE [place].   Recent consult visits: 05/28/22 Dr Fletcher Anon (Cardiology): PAD - ABI normal, pulses diminished. No limb ischemia. Re-challenge with atorvastatin 20 mg   12/06/21 Dr Honor Junes (Endocrine): f/u DM. A1c 9.0%. Fructosamine 330 ~A1c 7.2   Hospital visits: None in previous 6 months   Objective:  Lab Results  Component Value Date   CREATININE 1.20 04/11/2022   BUN 14 04/11/2022   GFR 44.94 (L) 03/05/2021   GFRNONAA >60 04/11/2022   GFRAA >60 12/07/2019   NA 138 04/11/2022   K 4.2 04/11/2022   CALCIUM 8.8 (L) 04/11/2022   CO2 25 04/11/2022   GLUCOSE 189 (H) 04/11/2022    Lab Results  Component Value Date/Time   HGBA1C 9.0 12/06/2021 12:00 AM   HGBA1C 9.2 (H)  11/10/2020 09:34 AM   HGBA1C 9.0 03/30/2020 12:00 AM   GFR 44.94 (L) 03/05/2021 09:17 AM   GFR 45.31 (L) 02/20/2021 11:33 AM   MICROALBUR 5.6 (H) 02/28/2015 09:19 AM   MICROALBUR 5.9 (H) 02/19/2010 03:44 PM    Last diabetic Eye exam:  Lab Results  Component Value Date/Time   HMDIABEYEEXA No Retinopathy 07/11/2021 12:00 AM    Last diabetic Foot exam:  Lab Results  Component Value Date/Time   HMDIABFOOTEX done 03/19/2016 12:00 AM     Lab Results  Component Value Date   CHOL 184 12/06/2021   HDL 40 12/06/2021   LDLCALC 118 12/06/2021   LDLDIRECT 160.2 06/01/2012   TRIG 133 12/06/2021   CHOLHDL 5 11/10/2020       Latest Ref Rng & Units 02/20/2021   11:33 AM 11/10/2020    9:34 AM 12/07/2019    4:29 AM  Hepatic Function  Total Protein 6.0 - 8.3 g/dL 7.0  6.7  6.5   Albumin 3.5 - 5.2 g/dL 4.3  4.2  3.6   AST 0 - 37 U/L 15  18  19    ALT 0 - 53 U/L 17  19  20    Alk Phosphatase 39 - 117 U/L 60  58  58   Total Bilirubin 0.2 - 1.2 mg/dL 0.7  0.8  0.9     Lab Results  Component Value Date/Time   TSH 1.46 11/10/2020 09:34 AM   TSH 1.59 09/28/2018 10:39 AM  Latest Ref Rng & Units 04/11/2022    2:24 PM 02/20/2021   11:33 AM 11/10/2020    9:34 AM  CBC  WBC 4.0 - 10.5 K/uL 6.5  5.8  6.3   Hemoglobin 13.0 - 17.0 g/dL 12.4  13.6  13.8   Hematocrit 39.0 - 52.0 % 37.7  41.7  41.4   Platelets 150 - 400 K/uL 213  231.0  226.0     Lab Results  Component Value Date/Time   VD25OH 31 03/24/2009 09:37 PM   [id number] 04/18/2017 10:10 AM   VITAMINB12 511 03/24/2009 09:39 AM    Clinical ASCVD: Yes  The ASCVD Risk score (Arnett DK, et al., 2019) failed to calculate for the following reasons:   The patient has a prior MI or stroke diagnosis        11/01/2022   10:12 AM 10/31/2021    9:52 AM 10/27/2020    9:53 AM  Depression screen PHQ 2/9  Decreased Interest 0 0 0  Down, Depressed, Hopeless 0 0 0  PHQ - 2 Score 0 0 0  Altered sleeping   0  Tired, decreased energy   0   Change in appetite   0  Feeling bad or failure about yourself    0  Trouble concentrating   0  Moving slowly or fidgety/restless   0  Suicidal thoughts   0  PHQ-9 Score   0  Difficult doing work/chores   Not difficult at all     Social History   Tobacco Use  Smoking Status Former   Packs/day: 0.50   Years: 40.00   Additional pack years: 0.00   Total pack years: 20.00   Types: Pipe, Cigarettes   Quit date: 06/09/2016   Years since quitting: 6.4  Smokeless Tobacco Never   BP Readings from Last 3 Encounters:  05/28/22 122/80  04/16/22 (!) 162/75  01/08/22 (!) 142/84   Pulse Readings from Last 3 Encounters:  05/28/22 83  04/16/22 88  01/08/22 71   Wt Readings from Last 3 Encounters:  11/01/22 187 lb (84.8 kg)  05/28/22 187 lb 8 oz (85 kg)  04/11/22 195 lb (88.5 kg)   BMI Readings from Last 3 Encounters:  11/01/22 28.43 kg/m  05/28/22 28.51 kg/m  04/11/22 29.65 kg/m    Allergies  Allergen Reactions   Bee Venom Anaphylaxis   Pravastatin Other (See Comments)    Severe myalgias   Ace Inhibitors     Cough   Actos [Pioglitazone] Other (See Comments)    Intolerant, not allergic.    Angiotensin Receptor Blockers Other (See Comments)    cramps   Aripiprazole Other (See Comments)    Unknown reaction many years ago   Crestor [Rosuvastatin Calcium] Other (See Comments)    Aches, even with twice weekly dosing   Invokana [Canagliflozin]     Edema   Metformin And Related Other (See Comments)    Able to tolerate XR formulation, not allergic.    Nsaids     Would avoid given creatinine elevation.   Olanzapine Other (See Comments)    Unknown reaction many years ago   Pneumovax 23 [Pneumococcal Vac Polyvalent] Other (See Comments)    aches   Codeine Rash   Divalproex Sodium Other (See Comments)     tremor   Lithium Carbonate Other (See Comments)    Tremors    Sulfamethoxazole-Trimethoprim Other (See Comments)    Mild reaction per Dr. Silvio Pate, pt does not remember  the reaction  Medications Reviewed Today     Reviewed by Charlton Haws, [facility] (Pharmacist) on 11/29/22 at 1149  Med List Status: <None>   Medication Order Taking? Sig Documenting Provider Last Dose Status Informant  ALPRAZolam (XANAX) 0.5 MG tablet [id number] Yes TAKE ONE TABLET BY MOUTH TWICE DAILY WITH AN EXTRA 1/2 TABLET IF NEEDED. MAY CAUSE DROWSINESS. [name], MD Taking Active   aspirin 81 MG EC tablet [id number] Yes Take 1 tablet (81 mg total) by mouth daily. For stroke prevention  Patient taking differently: Take 81 mg by mouth daily with lunch. For stroke prevention   [name], MD Taking Active   BD PEN NEEDLE NANO U/F 32G X 4 MM MISC [id number] Yes USE AS DIRECTED [name], MD Taking Active   clopidogrel (PLAVIX) 75 MG tablet [id number] Yes TAKE 1 TABLET BY MOUTH DAILY [name], MD Taking Active   FLUoxetine (PROZAC) 10 MG capsule [id number] Yes Take 3 capsules (30 mg total) by mouth daily with lunch. TAKE 3 CAPSULES BY MOUTH DAILY FOR MOOD AS DIRECTED Poggi, Marshall Cork, MD Taking Active   glipiZIDE (GLUCOTROL XL) 10 MG 24 hr tablet [id number] Yes Take 1 tablet (10 mg total) by mouth daily with breakfast. For diabetes [name], MD Taking Active   insulin glargine (LANTUS SOLOSTAR) 100 UNIT/ML Solostar Pen [id number] Yes Inject 20 Units into the skin at bedtime. For diabetes Poggi, Marshall Cork, MD Taking Active   metFORMIN (GLUCOPHAGE-XR) 500 MG 24 hr tablet [id number] Yes TAKE ONE TABLET 3 TIMES DAILY for diabetes [name], MD Taking Active   oxyCODONE-acetaminophen (PERCOCET/ROXICET) 5-325 MG tablet [id number] Yes TAKE ONE TO TWO TABLETS BY MOUTH 3 TIMESDAILY AS NEEDED FOR PAIN.  Fill on/after 09/21/22 [name], MD Taking Active   oxyCODONE-acetaminophen (PERCOCET/ROXICET) 5-325 MG tablet [id number] Yes TAKE 1-2 TABLETS BY MOUTH 3 TIMES DAILY AS NEEDED FOR PAIN. [name], MD Taking Active   oxyCODONE-acetaminophen (PERCOCET/ROXICET)  5-325 MG tablet [id number] Yes TAKE ONE TO TWO TABLETS BY MOUTH 3 TIMES DAILY IF NEEDED FOR PAIN. [name], MD Taking Active   primidone (MYSOLINE) 50 MG tablet [id number] Yes TAKE 1 TABLET BY MOUTH DAILY [name], MD Taking Active   propranolol ER (INDERAL LA) 60 MG 24 hr capsule [id number] Yes TAKE 1 CAPSULE BY MOUTH ONCE DAILY [name], MD Taking Active   tiZANidine (ZANAFLEX) 4 MG tablet [id number] Yes TAKE ONE TABLET 3 TIMES DAILY AS NEEDED FOR MUSCLE SPASM MAY TAKE 1 EXTRADOSE DAILY AS NEEDED [name], MD Taking Active   traZODone (DESYREL) 50 MG tablet [id number] Yes TAKE 1 AND 1/2 TABLETS BY MOUTH AT BEDTIME AS NEEDED FOR SLEEP [name], MD Taking Active             SDOH:  (Social Determinants of Health) assessments and interventions performed: No SDOH Interventions    Flowsheet Row Clinical Support from 11/01/2022 in [place] at [place] Management from 03/06/2021 in [facility] at [place] from 10/27/2020 in [place] at [place] Management from 09/05/2020 in [place] [facility] [place] from 10/01/2019 in [place] at [place] Interventions Intervention Not Indicated -- -- -- --  Housing Interventions Intervention Not Indicated -- -- -- --  Transportation Interventions Intervention Not Indicated -- -- -- --  Utilities Interventions Intervention Not Indicated -- -- -- --  Alcohol Usage Interventions Intervention Not Indicated (Score <7) -- -- -- --  Depression Interventions/Treatment  -- -- [id number] Score <4 Follow-up Not Indicated -- PHQ2-9 Score <4 Follow-up Not Indicated  Financial Strain Interventions Intervention Not Indicated Intervention Not Indicated -- Intervention Not Indicated --  Physical Activity Interventions Intervention Not  Indicated -- -- -- --  Stress Interventions Intervention Not Indicated -- -- -- --  Social Connections Interventions Intervention Not Indicated -- -- -- --       Medication Assistance: None required.  Patient affirms current coverage meets needs.  Medication Access: Within the past 30 days, how often has patient missed a dose of medication? 0 Is a pillbox or other method used to improve adherence? Yes  Factors that may affect medication adherence? adverse effects of medications Are meds synced by current pharmacy? No  Are meds delivered by current pharmacy? No  Does patient experience delays in picking up medications due to transportation concerns? No   [facility] Services Reviewed: Is patient disadvantaged to use [facility]?: Yes  Current Rx insurance plan: HTA Name and location of Current pharmacy:  [place], [place] - [address] [place] [address] Phone: [phone] Fax: [phone]  [facility] services reviewed with patient today?: No  Patient requests to transfer care to [facility] Pharmacy?: No  Reason patient declined to change pharmacies: Disadvantaged due to insurance/mail order  Compliance/Adherence/Medication fill history: Care Gaps: Lung ca screening (never) - quit smoking 2017 UACR (due 02/2016) - A/G ratio done 11/2021 @ [facility] A1c (due 05/2022) Eye exam (due 07/2022)  Star-Rating Drugs: Atorvastatin - PDC 17%; LF 05/28/22 x 30 ds Glipizide - PDC 98% Metformin - PDC 100%   ASSESSMENT / PLAN  Hypertension / CKD stage 3b (BP goal <140/90) -Controlled - per clinic BP -Current home readings: none reported -Denies hypotensive/hypertensive symptoms -Current treatment: Irbesartan 150 mg daily - Appropriate, Effective, Safe, Accessible Propranolol ER 60 mg daily (tremor) - Appropriate, Effective, Safe, Accessible -Medications previously tried: amlodipine - stopped due to orthostatic hypotension -Educated on BP  goals and benefits of medications for prevention of heart attack, stroke and kidney damage; -Counseled to monitor BP at home with symptoms of dizziness -Recommended to continue current medication  Diabetes (A1c goal <8%) -Controlled - A1c 9.0% (11/2021), however fructosamine 330 ~correlates to A1c 7.2%; pt is wearing Dexcom -Follows with Endocrine (Dr Honor Junes); last appt was 12/06/21, pt does not have a f/u appt scheduled -Current home glucose readings Fasting glucose: 120-130 Post-prandial: <250 -Current medications: Glipizide 10 mg XL daily - Appropriate, Effective, Safe, Accessible Lantus Solostar 19 units daily -Appropriate, Query Effective, Metformin ER 500 mg TID -Appropriate, Query Effective, Dexcom G6- Appropriate, Effective, Safe, Accessible -Medications previously tried: glimepiride, Invokana (edema), pioglitazone -Reviewed A1c goal, blood sugar targets; pt denies hypoglycemia -Recommended to continue current medication; advised to make appt with endocrine  Hyperlipidemia / ASCVD: (LDL goal < 70) -Not ideally controlled - LDL 118 (10/2021), pt has not tolerated multiple statins include low dose rosuvastatin; he has also failed zetia; recently he failed re-challenge with atorvastatin 20 mg -Hx CVA, PAD; statin intolerant -Current treatment: Atorvastatin 20 mg daily - not taking Clopidogrel 75 mg daily -Appropriate, Effective, Safe, Accessible Aspirin 81 mg daily -Appropriate, Effective, Safe, Accessible -Medications previously tried: pravastatin, rosuvastatin (BIW), atorvastatin, ezetimibe -Educated on Cholesterol goals; Importance of limiting foods high in cholesterol; -Discussed role of statins in PAD, hx CVA  to prevent future plaques -Pt may be a candidate for Nexletol or PCSK9-inhibitor; will reassess after repeat lipid panel -Recommend to continue current medication; advised to f/u with cardiology as scheduled, plan to discuss cholesterol management  Anxiety / Panic  (Goal: manage symptoms) -Controlled -PHQ9: 0 (10/2021) - minimal depression -GAD7: not on file -Connected with PCP for mental health support -Current treatment: Fluoxetine 10 mg - 3 cap daily - Appropriate, Effective, Safe, Accessible Alprazolam 0.5 mg BID -Appropriate, Effective, Query Safe Trazodone 50 mg daily -Appropriate, Effective, Safe, Accessible -Medications previously tried/failed: n/a -Educated on Benefits of medication for symptom control -Previously discussed sedation/fall risk with alprazolam especially in combination with oxycodone; pt denies oversedation or overuse of medications in general -Recommended to continue current medication  Essential tremor (Goal: manage symptoms) -Controlled -Current treatment  Propranolol ER 60 mg daily - Appropriate, Effective, Safe, Accessible Primidone 50 mg daily - Appropriate, Effective, Safe, Accessible -Medications previously tried: n/a  -Recommended to continue current medication   Charlene Brooke, PharmD, [place], CPP Clinical Pharmacist Practitioner [place] at [facility] [phone]

## 2022-12-05 NOTE — Progress Notes (Deleted)
Cardiology Office Note   Date:  12/05/2022   ID:  [name], [name] [birth year], MRN [id number]  PCP:  Walter Ghent, MD  Cardiologist:   Walter Sacramento, MD   No chief complaint on file.     History of Present Illness: Walter Harris is a 77 y.o. male who was referred by Walter Harris for evaluation management of peripheral arterial disease.  He has known history of type 2 diabetes, essential hypertension, hyperlipidemia and previous strokes.  He was seen by me in 2016 for syncope that was felt to be vasovagal in etiology.  He was seen by Walter Harris in 2019 for dizziness and syncope.  Symptoms were felt to be again due to vasovagal syncope.  He was also noted to be orthostatic.  Outpatient monitor showed no evidence of arrhythmia.  Echocardiogram showed normal LV systolic function with no significant valvular abnormalities. He was hospitalized in April 2021 with slurred speech and expressive aphasia.  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  Echocardiogram was unremarkable.  Carotid Doppler showed mild nonobstructive disease.  He has known history of severe low back pain and required multiple back surgeries.  His mobility is limited due to that and thus he has not noticed any calf claudication. He was found to have abnormal distal pulses. He underwent lower extremity arterial Doppler which showed normal ABI and TBI but the distal waveforms are biphasic.  In addition, blood pressure in the left arm was 47 mmHg lower than the right. He denies left arm claudication.  No lower extremity ulceration.  Past Medical History:  Diagnosis Date   Basal cell carcinoma    multiple   Cataracts, both eyes 2019   surgery pending April [id number]   Complication of anesthesia    post-operative cognitive dysfunction after ACDF on 05/01/11 [facility])   Deaf    right ear   Diabetes mellitus    DJD (degenerative joint disease)    lumbar spine   Gait abnormality 11/12/2019   Hearing impaired    hearing  aids-transmitter rt    History of blood transfusion    as a baby in 10/14/1945   History of colonic polyps    History of kidney stones    Hyperlipidemia    Hypertension    pt. denies high blood pressure   Memory change 12/17/2017   Nose fracture    3x   OCD (obsessive compulsive disorder)    Stroke ([place]) 12/06/2019   "mini stroke, that's what gave me the tremors"   Stuttering    Tremor, essential    Vertigo    random   Wears dentures    full upper   Wears glasses     Past Surgical History:  Procedure Laterality Date   [place]  2012   lumbar surgery   CATARACT EXTRACTION W/PHACO Right 04/12/2019   Procedure: CATARACT EXTRACTION PHACO AND INTRAOCULAR LENS PLACEMENT ([place]) RIGHT, DIABETIC;  Surgeon: Walter Bear, MD;  Location: [facility];  Service: Ophthalmology;  Laterality: Right;  Diabetic - insulin and oral meds   CATARACT EXTRACTION W/PHACO Left 05/10/2019   Procedure: CATARACT EXTRACTION PHACO AND INTRAOCULAR LENS PLACEMENT ([facility])  LEFT DIABETIC  00:25.6  14.4%  3.75;  Surgeon: Walter Bear, MD;  Location: [facility];  Service: Ophthalmology;  Laterality: Left;  Diabetic - insulin and oral meds   CERVICAL FUSION  1990   CERVICAL FUSION  8/12   multiple  levels with plates   CHOLESTEATOMA EXCISION  1993   COLLATERAL LIGAMENT REPAIR, ELBOW     bilateral, nerve improvement left 08/04, right 09/04   COLONOSCOPY     CYST EXCISION     back of cervical spine total of 5 surgeries   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   Procedure: DUPUYTREN CONTRACTURE RELEASE;  Surgeon: Walter Mull, MD;  Location: [facility];  Service: Orthopedics;  Laterality: Left;   EXTERNAL EAR SURGERY     multiple ear surguries as a child   OTHER SURGICAL HISTORY     benign head tumor#5   OTHER SURGICAL HISTORY     sebaceous cysts-post neck x5   SHOULDER ARTHROSCOPY W/ ACROMIAL REPAIR  08/2004   left shoulder   TONSILLECTOMY     ULNAR NERVE  TRANSPOSITION Right 05/13/2013   Procedure: RIGHT ULNAR NERVE DECOMPRESSION/TRANSPOSITION;  Surgeon: Walter Sickle., MD;  Location: [facility];  Service: Orthopedics;  Laterality: Right;     Current Outpatient Medications  Medication Sig Dispense Refill   ALPRAZolam (XANAX) 0.5 MG tablet TAKE ONE TABLET BY MOUTH TWICE DAILY WITH AN EXTRA 1/2 TABLET IF NEEDED. MAY CAUSE DROWSINESS. 75 tablet 2   aspirin 81 MG EC tablet Take 1 tablet (81 mg total) by mouth daily. For stroke prevention (Patient taking differently: Take 81 mg by mouth daily with lunch. For stroke prevention)     BD PEN NEEDLE NANO U/F 32G X 4 MM MISC USE AS DIRECTED 100 each 3   clopidogrel (PLAVIX) 75 MG tablet TAKE 1 TABLET BY MOUTH DAILY 90 tablet 3   FLUoxetine (PROZAC) 10 MG capsule Take 3 capsules (30 mg total) by mouth daily with lunch. TAKE 3 CAPSULES BY MOUTH DAILY FOR MOOD AS DIRECTED     glipiZIDE (GLUCOTROL XL) 10 MG 24 hr tablet Take 1 tablet (10 mg total) by mouth daily with breakfast. For diabetes     insulin glargine (LANTUS SOLOSTAR) 100 UNIT/ML Solostar Pen Inject 20 Units into the skin at bedtime. For diabetes     metFORMIN (GLUCOPHAGE-XR) 500 MG 24 hr tablet TAKE ONE TABLET 3 TIMES DAILY for diabetes     oxyCODONE-acetaminophen (PERCOCET/ROXICET) 5-325 MG tablet TAKE ONE TO TWO TABLETS BY MOUTH 3 TIMESDAILY AS NEEDED FOR PAIN.  Fill on/after 09/21/22 180 tablet 0   oxyCODONE-acetaminophen (PERCOCET/ROXICET) 5-325 MG tablet TAKE 1-2 TABLETS BY MOUTH 3 TIMES DAILY AS NEEDED FOR PAIN. 180 tablet 0   oxyCODONE-acetaminophen (PERCOCET/ROXICET) 5-325 MG tablet TAKE ONE TO TWO TABLETS BY MOUTH 3 TIMES DAILY IF NEEDED FOR PAIN. 180 tablet 0   primidone (MYSOLINE) 50 MG tablet TAKE 1 TABLET BY MOUTH DAILY 30 tablet 1   propranolol ER (INDERAL LA) 60 MG 24 hr capsule TAKE 1 CAPSULE BY MOUTH ONCE DAILY 90 capsule 1   tiZANidine (ZANAFLEX) 4 MG tablet TAKE ONE TABLET 3 TIMES DAILY AS NEEDED FOR MUSCLE SPASM  MAY TAKE 1 EXTRADOSE DAILY AS NEEDED 120 tablet 2   traZODone (DESYREL) 50 MG tablet TAKE 1 AND 1/2 TABLETS BY MOUTH AT BEDTIME AS NEEDED FOR SLEEP 135 tablet 0   No current facility-administered medications for this visit.    Allergies:   Bee venom, Pravastatin, Ace inhibitors, Actos [pioglitazone], Angiotensin receptor blockers, Aripiprazole, Crestor [rosuvastatin calcium], Invokana [canagliflozin], Metformin and related, Nsaids, Olanzapine, Pneumovax 23 [pneumococcal vac polyvalent], Codeine, Divalproex sodium, Lithium carbonate, and Sulfamethoxazole-trimethoprim    Social History:  The patient  reports that he quit smoking about 6 years ago.  His smoking use included pipe and cigarettes. He has a 20.00 pack-year smoking history. He has never used smokeless tobacco. He reports that he does not drink alcohol and does not use drugs.   Family History:  The patient's family history includes Arthritis in his sister; Bipolar disorder in his mother; Cancer in his mother; Emphysema in his mother; Glaucoma in his father; OCD in his mother and sister.    ROS:  Please see the history of present illness.   Otherwise, review of systems are positive for none.   All other systems are reviewed and negative.    PHYSICAL EXAM: VS:  There were no vitals taken for this visit. , BMI There is no height or weight on file to calculate BMI. GEN: Well nourished, well developed, in no acute distress  HEENT: normal  Neck: no JVD, carotid bruits, or masses Cardiac: RRR; no murmurs, rubs, or gallops,no edema  Respiratory:  clear to auscultation bilaterally, normal work of breathing GI: soft, nontender, nondistended, + BS MS: no deformity or atrophy  Skin: warm and dry, no rash Neuro:  Strength and sensation are intact Psych: euthymic mood, full affect Vascular: Radial pulse: Normal on the right side and absent on the left.  Distal pulses are palpable on the left side but not the right side   EKG:  EKG is not  ordered today.    Recent Labs: 04/11/2022: BUN 14; Creatinine, Ser 1.20; Hemoglobin 12.4; Platelets 213; Potassium 4.2; Sodium 138    Lipid Panel    Component Value Date/Time   CHOL 184 12/06/2021 0000   CHOL 183 09/29/2019 0000   TRIG 133 12/06/2021 0000   TRIG 154 09/29/2019 0000   HDL 40 12/06/2021 0000   CHOLHDL 5 11/10/2020 0934   VLDL 39.0 11/10/2020 0934   LDLCALC 118 12/06/2021 0000   LDLCALC 108 09/29/2019 0000   LDLDIRECT 160.2 06/01/2012 1328      Wt Readings from Last 3 Encounters:  11/01/22 187 lb (84.8 kg)  05/28/22 187 lb 8 oz (85 kg)  04/11/22 195 lb (88.5 kg)           05/28/2022    3:07 PM 04/09/2018    1:55 PM  PAD Screen  Previous PAD dx? No No  Previous surgical procedure? No No  Pain with walking? No No  Feet/toe relief with dangling? No No  Painful, non-healing ulcers? No No  Extremities discolored? No No      ASSESSMENT AND PLAN:  1.  Peripheral arterial disease: Even though his ABI was normal, his distal pulses are very diminished on the right side compared to the left.  Nonetheless, he seems to be asymptomatic likely due to his limited mobility.  He has no evidence of critical limb ischemia.  Thus, I recommend continuing medical therapy.  2.  Left subclavian artery stenosis or occlusion: The patient has no palpable pulses in the left arm.  Fortunately, he denies left arm claudication.  I requested carotid Doppler for evaluation.  3.  Hyperlipidemia: History of intolerance to statins.  Given that he is diabetic and has evidence of peripheral arterial disease, we should rechallenge him.  Elected to add atorvastatin 20 mg daily.  4.  Essential hypertension: Blood pressure is reasonably controlled.  Always check in the right arm.    Disposition:   FU with me in 4 months  Signed,  Walter Sacramento, MD  12/05/2022 10:00 PM    [place]

## 2022-12-06 ENCOUNTER — Encounter: Payer: Self-pay | Admitting: Cardiovascular Disease

## 2022-12-06 ENCOUNTER — Ambulatory Visit: Payer: PPO | Attending: Cardiovascular Disease | Admitting: Cardiovascular Disease

## 2022-12-12 ENCOUNTER — Telehealth: Payer: Self-pay | Admitting: Family Medicine

## 2022-12-12 MED ORDER — OXYCODONE-ACETAMINOPHEN 5-325 MG PO TABS: ORAL_TABLET | ORAL | 0 refills | Status: DC

## 2022-12-12 MED ORDER — OXYCODONE-ACETAMINOPHEN 5-325 MG PO TABS: ORAL_TABLET | ORAL | 0 refills | Status: AC

## 2022-12-12 NOTE — Telephone Encounter (Signed)
Prescription Request  12/12/2022  LOV: 01/08/2022  What is the name of the medication or equipment? oxyCODONE-acetaminophen (PERCOCET/ROXICET) 5-325 MG tablet   Have you contacted your pharmacy to request a refill? No   Which pharmacy would you like this sent to?  [facility] - [place], [place] - [place] [address] Phone: [phone] Fax: [phone]    Patient notified that their request is being sent to the clinical staff for review and that they should receive a response within 2 business days.   Please advise at  [phone]

## 2022-12-12 NOTE — Telephone Encounter (Signed)
LOV - 01/09/23 NOV - 01/13/23 RF - 07/23/22 #180/0 x 3

## 2022-12-12 NOTE — Telephone Encounter (Signed)
Sent. Thanks.   

## 2022-12-23 ENCOUNTER — Other Ambulatory Visit: Payer: Self-pay | Admitting: Family Medicine

## 2022-12-23 NOTE — Telephone Encounter (Signed)
Refill request for ALPRAZOLAM 0.5 MG TAB   LOV - 01/08/22 Next OV - 01/13/23 Last refill - 09/05/22 #75/2  *Also needs propranolol refilled*

## 2022-12-31 ENCOUNTER — Other Ambulatory Visit: Payer: Self-pay | Admitting: Family Medicine

## 2023-01-05 ENCOUNTER — Other Ambulatory Visit: Payer: Self-pay | Admitting: Family Medicine

## 2023-01-05 DIAGNOSIS — E1165 Type 2 diabetes mellitus with hyperglycemia: Secondary | ICD-10-CM

## 2023-01-06 ENCOUNTER — Other Ambulatory Visit: Payer: PPO

## 2023-01-13 ENCOUNTER — Encounter: Payer: PPO | Admitting: Family Medicine

## 2023-01-14 ENCOUNTER — Telehealth: Payer: Self-pay | Admitting: Family Medicine

## 2023-01-14 NOTE — Telephone Encounter (Signed)
3 different rxs were sent to the pharmacy on 12/12/22; called patient and let him know to check with the pharmacy.

## 2023-01-14 NOTE — Telephone Encounter (Signed)
Prescription Request  01/14/2023  LOV: Visit date not found  What is the name of the medication or equipment? oxyCODONE-acetaminophen (PERCOCET/ROXICET) 5-325 MG tablet   Have you contacted your pharmacy to request a refill? No   Which pharmacy would you like this sent to?  [facility] - [place], [place] - [address] [place] [address] Phone: [phone] Fax: [phone]    Patient notified that their request is being sent to the clinical staff for review and that they should receive a response within 2 business days.   Please advise at Mobile [phone] (mobile)

## 2023-01-16 ENCOUNTER — Encounter: Payer: PPO | Admitting: Family Medicine

## 2023-02-06 ENCOUNTER — Encounter: Payer: Self-pay | Admitting: Cardiovascular Disease

## 2023-02-11 ENCOUNTER — Telehealth: Payer: Self-pay | Admitting: Family Medicine

## 2023-02-11 NOTE — Telephone Encounter (Signed)
Pt's wife, [name], called asking Dr. Para March if had any [facility] company suggestions for the pt? [name] states she is in need of help with the the pt. Call back # [phone]

## 2023-02-11 NOTE — Telephone Encounter (Signed)
Called and spoke with patients wife [name] and advised that theres no particular HH agency we recommend; mostly depends on insurance and if what company can take him on as a patient. Did advise that we will need to see patient via video or in person as it has been over a year since we have seen him if we do the referral for [facility]. She is waiting on a call from her insurance company about this and will call back and make appt if needed.

## 2023-02-12 ENCOUNTER — Emergency Department: Payer: PPO

## 2023-02-12 ENCOUNTER — Inpatient Hospital Stay
Admission: EM | Admit: 2023-02-12 | Discharge: 2023-02-24 | DRG: 682 | Disposition: A | Discharge status: Skilled Nursing Facility | Payer: PPO | Attending: Internal Medicine | Admitting: Internal Medicine

## 2023-02-12 ENCOUNTER — Other Ambulatory Visit: Payer: Self-pay

## 2023-02-12 DIAGNOSIS — E872 Acidosis, unspecified: Secondary | ICD-10-CM | POA: Diagnosis present

## 2023-02-12 DIAGNOSIS — M62838 Other muscle spasm: Secondary | ICD-10-CM | POA: Diagnosis not present

## 2023-02-12 DIAGNOSIS — Z87442 Personal history of urinary calculi: Secondary | ICD-10-CM

## 2023-02-12 DIAGNOSIS — I69398 Other sequelae of cerebral infarction: Secondary | ICD-10-CM | POA: Diagnosis not present

## 2023-02-12 DIAGNOSIS — A419 Sepsis, unspecified organism: Secondary | ICD-10-CM | POA: Diagnosis not present

## 2023-02-12 DIAGNOSIS — M419 Scoliosis, unspecified: Secondary | ICD-10-CM | POA: Diagnosis not present

## 2023-02-12 DIAGNOSIS — D72829 Elevated white blood cell count, unspecified: Secondary | ICD-10-CM | POA: Diagnosis present

## 2023-02-12 DIAGNOSIS — I2489 Other forms of acute ischemic heart disease: Secondary | ICD-10-CM | POA: Diagnosis present

## 2023-02-12 DIAGNOSIS — E785 Hyperlipidemia, unspecified: Secondary | ICD-10-CM | POA: Diagnosis not present

## 2023-02-12 DIAGNOSIS — G250 Essential tremor: Secondary | ICD-10-CM | POA: Diagnosis present

## 2023-02-12 DIAGNOSIS — R64 Cachexia: Secondary | ICD-10-CM | POA: Diagnosis not present

## 2023-02-12 DIAGNOSIS — Z794 Long term (current) use of insulin: Secondary | ICD-10-CM | POA: Diagnosis not present

## 2023-02-12 DIAGNOSIS — R6520 Severe sepsis without septic shock: Secondary | ICD-10-CM | POA: Diagnosis not present

## 2023-02-12 DIAGNOSIS — E538 Deficiency of other specified B group vitamins: Secondary | ICD-10-CM | POA: Diagnosis not present

## 2023-02-12 DIAGNOSIS — L89222 Pressure ulcer of left hip, stage 2: Secondary | ICD-10-CM | POA: Diagnosis present

## 2023-02-12 DIAGNOSIS — Z85828 Personal history of other malignant neoplasm of skin: Secondary | ICD-10-CM

## 2023-02-12 DIAGNOSIS — F411 Generalized anxiety disorder: Secondary | ICD-10-CM | POA: Diagnosis present

## 2023-02-12 DIAGNOSIS — H9190 Unspecified hearing loss, unspecified ear: Secondary | ICD-10-CM | POA: Diagnosis present

## 2023-02-12 DIAGNOSIS — R4182 Altered mental status, unspecified: Secondary | ICD-10-CM | POA: Diagnosis present

## 2023-02-12 DIAGNOSIS — E875 Hyperkalemia: Secondary | ICD-10-CM | POA: Diagnosis present

## 2023-02-12 DIAGNOSIS — Z8673 Personal history of transient ischemic attack (TIA), and cerebral infarction without residual deficits: Secondary | ICD-10-CM | POA: Diagnosis not present

## 2023-02-12 DIAGNOSIS — R531 Weakness: Secondary | ICD-10-CM

## 2023-02-12 DIAGNOSIS — M720 Palmar fascial fibromatosis [Dupuytren]: Secondary | ICD-10-CM | POA: Diagnosis not present

## 2023-02-12 DIAGNOSIS — E43 Unspecified severe protein-calorie malnutrition: Secondary | ICD-10-CM | POA: Diagnosis present

## 2023-02-12 DIAGNOSIS — Z87891 Personal history of nicotine dependence: Secondary | ICD-10-CM | POA: Diagnosis not present

## 2023-02-12 DIAGNOSIS — Z981 Arthrodesis status: Secondary | ICD-10-CM

## 2023-02-12 DIAGNOSIS — Z818 Family history of other mental and behavioral disorders: Secondary | ICD-10-CM

## 2023-02-12 DIAGNOSIS — E559 Vitamin D deficiency, unspecified: Secondary | ICD-10-CM | POA: Diagnosis present

## 2023-02-12 DIAGNOSIS — Z8261 Family history of arthritis: Secondary | ICD-10-CM

## 2023-02-12 DIAGNOSIS — G4700 Insomnia, unspecified: Secondary | ICD-10-CM | POA: Diagnosis not present

## 2023-02-12 DIAGNOSIS — I69320 Aphasia following cerebral infarction: Secondary | ICD-10-CM | POA: Diagnosis not present

## 2023-02-12 DIAGNOSIS — Z6825 Body mass index (BMI) 25.0-25.9, adult: Secondary | ICD-10-CM

## 2023-02-12 DIAGNOSIS — Z66 Do not resuscitate: Secondary | ICD-10-CM | POA: Diagnosis not present

## 2023-02-12 DIAGNOSIS — R410 Disorientation, unspecified: Secondary | ICD-10-CM | POA: Diagnosis not present

## 2023-02-12 DIAGNOSIS — I69322 Dysarthria following cerebral infarction: Secondary | ICD-10-CM | POA: Diagnosis not present

## 2023-02-12 DIAGNOSIS — R0689 Other abnormalities of breathing: Secondary | ICD-10-CM | POA: Diagnosis not present

## 2023-02-12 DIAGNOSIS — Z7982 Long term (current) use of aspirin: Secondary | ICD-10-CM

## 2023-02-12 DIAGNOSIS — E1142 Type 2 diabetes mellitus with diabetic polyneuropathy: Secondary | ICD-10-CM | POA: Diagnosis not present

## 2023-02-12 DIAGNOSIS — E860 Dehydration: Secondary | ICD-10-CM | POA: Diagnosis present

## 2023-02-12 DIAGNOSIS — R0902 Hypoxemia: Secondary | ICD-10-CM | POA: Diagnosis not present

## 2023-02-12 DIAGNOSIS — R2689 Other abnormalities of gait and mobility: Secondary | ICD-10-CM | POA: Diagnosis not present

## 2023-02-12 DIAGNOSIS — Z743 Need for continuous supervision: Secondary | ICD-10-CM | POA: Diagnosis not present

## 2023-02-12 DIAGNOSIS — R4701 Aphasia: Secondary | ICD-10-CM | POA: Diagnosis not present

## 2023-02-12 DIAGNOSIS — Z86010 Personal history of colonic polyps: Secondary | ICD-10-CM

## 2023-02-12 DIAGNOSIS — Z751 Person awaiting admission to adequate facility elsewhere: Secondary | ICD-10-CM

## 2023-02-12 DIAGNOSIS — Z23 Encounter for immunization: Secondary | ICD-10-CM | POA: Diagnosis not present

## 2023-02-12 DIAGNOSIS — R251 Tremor, unspecified: Secondary | ICD-10-CM | POA: Diagnosis not present

## 2023-02-12 DIAGNOSIS — L8990 Pressure ulcer of unspecified site, unspecified stage: Secondary | ICD-10-CM | POA: Insufficient documentation

## 2023-02-12 DIAGNOSIS — Z79899 Other long term (current) drug therapy: Secondary | ICD-10-CM

## 2023-02-12 DIAGNOSIS — Z7984 Long term (current) use of oral hypoglycemic drugs: Secondary | ICD-10-CM

## 2023-02-12 DIAGNOSIS — Z83511 Family history of glaucoma: Secondary | ICD-10-CM

## 2023-02-12 DIAGNOSIS — Z7401 Bed confinement status: Secondary | ICD-10-CM

## 2023-02-12 DIAGNOSIS — E119 Type 2 diabetes mellitus without complications: Secondary | ICD-10-CM | POA: Diagnosis not present

## 2023-02-12 DIAGNOSIS — N179 Acute kidney failure, unspecified: Principal | ICD-10-CM | POA: Diagnosis present

## 2023-02-12 DIAGNOSIS — F419 Anxiety disorder, unspecified: Secondary | ICD-10-CM | POA: Diagnosis not present

## 2023-02-12 DIAGNOSIS — R739 Hyperglycemia, unspecified: Secondary | ICD-10-CM | POA: Diagnosis not present

## 2023-02-12 DIAGNOSIS — Z825 Family history of asthma and other chronic lower respiratory diseases: Secondary | ICD-10-CM

## 2023-02-12 DIAGNOSIS — M6281 Muscle weakness (generalized): Secondary | ICD-10-CM | POA: Diagnosis not present

## 2023-02-12 DIAGNOSIS — R0789 Other chest pain: Secondary | ICD-10-CM | POA: Diagnosis not present

## 2023-02-12 DIAGNOSIS — R918 Other nonspecific abnormal finding of lung field: Secondary | ICD-10-CM | POA: Diagnosis not present

## 2023-02-12 DIAGNOSIS — I10 Essential (primary) hypertension: Secondary | ICD-10-CM | POA: Diagnosis present

## 2023-02-12 DIAGNOSIS — R54 Age-related physical debility: Secondary | ICD-10-CM | POA: Diagnosis present

## 2023-02-12 DIAGNOSIS — R000 Tachycardia, unspecified: Secondary | ICD-10-CM | POA: Diagnosis present

## 2023-02-12 DIAGNOSIS — Z7902 Long term (current) use of antithrombotics/antiplatelets: Secondary | ICD-10-CM

## 2023-02-12 DIAGNOSIS — E7800 Pure hypercholesterolemia, unspecified: Secondary | ICD-10-CM | POA: Diagnosis not present

## 2023-02-12 DIAGNOSIS — R404 Transient alteration of awareness: Secondary | ICD-10-CM | POA: Diagnosis not present

## 2023-02-12 DIAGNOSIS — R079 Chest pain, unspecified: Secondary | ICD-10-CM | POA: Diagnosis not present

## 2023-02-12 LAB — CBC WITH DIFFERENTIAL/PLATELET
Abs Immature Granulocytes: 0.05 10*3/uL (ref 0.00–0.07)
Basophils Absolute: 0 10*3/uL (ref 0.0–0.1)
Basophils Relative: 0 %
Eosinophils Absolute: 0 10*3/uL (ref 0.0–0.5)
Eosinophils Relative: 0 %
HCT: 37.6 % — ABNORMAL LOW (ref 39.0–52.0)
Hemoglobin: 12.8 g/dL — ABNORMAL LOW (ref 13.0–17.0)
Immature Granulocytes: 0 %
Lymphocytes Relative: 5 %
Lymphs Abs: 0.7 10*3/uL (ref 0.7–4.0)
MCH: 30.8 pg (ref 26.0–34.0)
MCHC: 34 g/dL (ref 30.0–36.0)
MCV: 90.4 fL (ref 80.0–100.0)
Monocytes Absolute: 0.8 10*3/uL (ref 0.1–1.0)
Monocytes Relative: 6 %
Neutro Abs: 11.1 10*3/uL — ABNORMAL HIGH (ref 1.7–7.7)
Neutrophils Relative %: 89 %
Platelets: 298 10*3/uL (ref 150–400)
RBC: 4.16 MIL/uL — ABNORMAL LOW (ref 4.22–5.81)
RDW: 12.6 % (ref 11.5–15.5)
WBC: 12.7 10*3/uL — ABNORMAL HIGH (ref 4.0–10.5)
nRBC: 0 % (ref 0.0–0.2)

## 2023-02-12 LAB — COMPREHENSIVE METABOLIC PANEL
ALT: 42 U/L (ref 0–44)
AST: 69 U/L — ABNORMAL HIGH (ref 15–41)
Albumin: 3.3 g/dL — ABNORMAL LOW (ref 3.5–5.0)
Alkaline Phosphatase: 65 U/L (ref 38–126)
Anion gap: 11 (ref 5–15)
BUN: 63 mg/dL — ABNORMAL HIGH (ref 8–23)
CO2: 17 mmol/L — ABNORMAL LOW (ref 22–32)
Calcium: 8.4 mg/dL — ABNORMAL LOW (ref 8.9–10.3)
Chloride: 109 mmol/L (ref 98–111)
Creatinine, Ser: 1.79 mg/dL — ABNORMAL HIGH (ref 0.61–1.24)
GFR, Estimated: 39 mL/min — ABNORMAL LOW (ref >60)
Glucose, Bld: 278 mg/dL — ABNORMAL HIGH (ref 70–99)
Potassium: 5.4 mmol/L — ABNORMAL HIGH (ref 3.5–5.1)
Sodium: 137 mmol/L (ref 135–145)
Total Bilirubin: 1.3 mg/dL — ABNORMAL HIGH (ref 0.3–1.2)
Total Protein: 6.5 g/dL (ref 6.5–8.1)

## 2023-02-12 LAB — URINALYSIS, ROUTINE W REFLEX MICROSCOPIC
Bilirubin Urine: NEGATIVE
Glucose, UA: 150 mg/dL — AB
Ketones, ur: 5 mg/dL — AB
Leukocytes,Ua: NEGATIVE
Nitrite: NEGATIVE
Protein, ur: 100 mg/dL — AB
Specific Gravity, Urine: 1.018 (ref 1.005–1.030)
Squamous Epithelial / HPF: NONE SEEN /HPF (ref 0–5)
pH: 5 (ref 5.0–8.0)

## 2023-02-12 LAB — PROCALCITONIN: Procalcitonin: 0.1 ng/mL

## 2023-02-12 LAB — TROPONIN I (HIGH SENSITIVITY): Troponin I (High Sensitivity): 58 ng/L — ABNORMAL HIGH (ref <18)

## 2023-02-12 LAB — VITAMIN B12: Vitamin B-12: 147 pg/mL — ABNORMAL LOW (ref 180–914)

## 2023-02-12 LAB — LACTIC ACID, PLASMA
Lactic Acid, Venous: 2.2 mmol/L (ref 0.5–1.9)
Lactic Acid, Venous: 1.8 mmol/L (ref 0.5–1.9)

## 2023-02-12 LAB — PROTIME-INR
INR: 1.1 (ref 0.8–1.2)
Prothrombin Time: 14.9 seconds (ref 11.4–15.2)

## 2023-02-12 LAB — CBG MONITORING, ED: Glucose-Capillary: 149 mg/dL — ABNORMAL HIGH (ref 70–99)

## 2023-02-12 MED ORDER — SODIUM CHLORIDE 0.9 % IV SOLN: INTRAVENOUS | Status: DC

## 2023-02-12 MED ORDER — TRAZODONE HCL 50 MG PO TABS
50.0000 mg | ORAL_TABLET | Freq: Every day | ORAL | Status: DC
  Administered 2023-02-13 – 2023-02-23 (×11): 50 mg via ORAL
  Filled 2023-02-12 (×11): qty 1

## 2023-02-12 MED ORDER — ONDANSETRON HCL 4 MG/2ML IJ SOLN: 4.0000 mg | Freq: Four times a day (QID) | INTRAMUSCULAR | Status: AC | PRN

## 2023-02-12 MED ORDER — HYDRALAZINE HCL 20 MG/ML IJ SOLN: 5.0000 mg | Freq: Three times a day (TID) | INTRAMUSCULAR | Status: AC | PRN

## 2023-02-12 MED ORDER — ACETAMINOPHEN 650 MG RE SUPP: 650.0000 mg | Freq: Four times a day (QID) | RECTAL | Status: AC | PRN

## 2023-02-12 MED ORDER — ENOXAPARIN SODIUM 40 MG/0.4ML IJ SOSY
40.0000 mg | PREFILLED_SYRINGE | INTRAMUSCULAR | Status: DC
  Administered 2023-02-12 – 2023-02-23 (×12): 40 mg via SUBCUTANEOUS
  Filled 2023-02-12 (×12): qty 0.4

## 2023-02-12 MED ORDER — SODIUM CHLORIDE 0.9 % IV BOLUS
1250.0000 mL | Freq: Once | INTRAVENOUS | Status: AC
  Administered 2023-02-12: 1250 mL via INTRAVENOUS

## 2023-02-12 MED ORDER — ALPRAZOLAM 0.5 MG PO TABS
0.5000 mg | ORAL_TABLET | Freq: Every day | ORAL | Status: DC | PRN
  Administered 2023-02-15 – 2023-02-16 (×2): 0.5 mg via ORAL
  Filled 2023-02-12 (×2): qty 1

## 2023-02-12 MED ORDER — OXYCODONE-ACETAMINOPHEN 5-325 MG PO TABS
1.0000 | ORAL_TABLET | Freq: Three times a day (TID) | ORAL | Status: DC | PRN
  Administered 2023-02-19: 1 via ORAL
  Filled 2023-02-12: qty 1

## 2023-02-12 MED ORDER — ACETAMINOPHEN 325 MG PO TABS: 650.0000 mg | ORAL_TABLET | Freq: Four times a day (QID) | ORAL | Status: AC | PRN

## 2023-02-12 MED ORDER — ONDANSETRON HCL 4 MG PO TABS: 4.0000 mg | ORAL_TABLET | Freq: Four times a day (QID) | ORAL | Status: AC | PRN

## 2023-02-12 MED ORDER — INSULIN ASPART 100 UNIT/ML IJ SOLN
0.0000 [IU] | Freq: Every day | INTRAMUSCULAR | Status: DC
  Administered 2023-02-19 – 2023-02-20 (×2): 2 [IU] via SUBCUTANEOUS
  Filled 2023-02-12 (×2): qty 1

## 2023-02-12 MED ORDER — SODIUM CHLORIDE 0.9 % IV BOLUS
1000.0000 mL | Freq: Once | INTRAVENOUS | Status: AC
  Administered 2023-02-12: 1000 mL via INTRAVENOUS

## 2023-02-12 MED ORDER — TIZANIDINE HCL 4 MG PO TABS: 4.0000 mg | ORAL_TABLET | Freq: Three times a day (TID) | ORAL | Status: DC | PRN

## 2023-02-12 MED ORDER — SENNOSIDES-DOCUSATE SODIUM 8.6-50 MG PO TABS: 1.0000 | ORAL_TABLET | Freq: Every evening | ORAL | Status: DC | PRN

## 2023-02-12 MED ORDER — FLUOXETINE HCL 10 MG PO CAPS
30.0000 mg | ORAL_CAPSULE | Freq: Every day | ORAL | Status: DC
  Administered 2023-02-13 – 2023-02-23 (×11): 30 mg via ORAL
  Filled 2023-02-12 (×12): qty 3

## 2023-02-12 MED ORDER — METRONIDAZOLE 500 MG/100ML IV SOLN
500.0000 mg | Freq: Two times a day (BID) | INTRAVENOUS | Status: DC
  Administered 2023-02-12 – 2023-02-14 (×5): 500 mg via INTRAVENOUS
  Filled 2023-02-12 (×5): qty 100

## 2023-02-12 MED ORDER — INSULIN GLARGINE-YFGN 100 UNIT/ML ~~LOC~~ SOLN
20.0000 [IU] | Freq: Every day | SUBCUTANEOUS | Status: DC
  Administered 2023-02-12 – 2023-02-23 (×12): 20 [IU] via SUBCUTANEOUS
  Filled 2023-02-12 (×12): qty 0.2

## 2023-02-12 MED ORDER — SODIUM CHLORIDE 0.9 % IV SOLN
2.0000 g | Freq: Two times a day (BID) | INTRAVENOUS | Status: DC
  Administered 2023-02-12 – 2023-02-14 (×4): 2 g via INTRAVENOUS
  Filled 2023-02-12 (×5): qty 12.5

## 2023-02-12 MED ORDER — VANCOMYCIN HCL 1500 MG/300ML IV SOLN
1500.0000 mg | Freq: Once | INTRAVENOUS | Status: AC
  Administered 2023-02-12: 1500 mg via INTRAVENOUS
  Filled 2023-02-12: qty 300

## 2023-02-12 MED ORDER — ASPIRIN 81 MG PO TBEC
81.0000 mg | DELAYED_RELEASE_TABLET | Freq: Every day | ORAL | Status: DC
  Administered 2023-02-13 – 2023-02-24 (×12): 81 mg via ORAL
  Filled 2023-02-12 (×12): qty 1

## 2023-02-12 MED ORDER — VANCOMYCIN HCL 750 MG/150ML IV SOLN
750.0000 mg | INTRAVENOUS | Status: DC
  Filled 2023-02-12: qty 150

## 2023-02-12 MED ORDER — INSULIN ASPART 100 UNIT/ML IJ SOLN
0.0000 [IU] | Freq: Three times a day (TID) | INTRAMUSCULAR | Status: DC
  Administered 2023-02-13: 1 [IU] via SUBCUTANEOUS
  Administered 2023-02-14 (×2): 2 [IU] via SUBCUTANEOUS
  Administered 2023-02-15: 5 [IU] via SUBCUTANEOUS
  Administered 2023-02-15: 2 [IU] via SUBCUTANEOUS
  Administered 2023-02-16: 3 [IU] via SUBCUTANEOUS
  Administered 2023-02-16 – 2023-02-17 (×2): 2 [IU] via SUBCUTANEOUS
  Administered 2023-02-17: 1 [IU] via SUBCUTANEOUS
  Administered 2023-02-18: 2 [IU] via SUBCUTANEOUS
  Administered 2023-02-18: 3 [IU] via SUBCUTANEOUS
  Administered 2023-02-19: 5 [IU] via SUBCUTANEOUS
  Administered 2023-02-19: 3 [IU] via SUBCUTANEOUS
  Administered 2023-02-20: 7 [IU] via SUBCUTANEOUS
  Administered 2023-02-20: 5 [IU] via SUBCUTANEOUS
  Administered 2023-02-21 (×2): 1 [IU] via SUBCUTANEOUS
  Administered 2023-02-22: 3 [IU] via SUBCUTANEOUS
  Administered 2023-02-22 (×2): 5 [IU] via SUBCUTANEOUS
  Administered 2023-02-24: 1 [IU] via SUBCUTANEOUS
  Filled 2023-02-12 (×19): qty 1

## 2023-02-12 NOTE — Assessment & Plan Note (Signed)
Patient had a low bated heart rate, respiration rate, leukocytosis of 12.7, elevated lactic acid of 2.2, organ involvement is renal Blood cultures x 2 are in process Check procalcitonin, if elevated we will initiate atypical pneumonia coverage Vancomycin, cefepime per pharmacy ordered; metronidazole 500 mg IV twice daily ordered Sodium chloride 1.250 L ordered on admission to complete sepsis bolus Telemetry cardiac, inpatient

## 2023-02-12 NOTE — Assessment & Plan Note (Signed)
-   Insulin SSI with at bedtime coverage - Goal inpatient blood glucose levels 140-180 

## 2023-02-12 NOTE — Assessment & Plan Note (Addendum)
Etiology workup in progress, differentials include severe sepsis with dehydration complicated by nonverbal baseline state in setting of history of CVA Blood cultures in process Treatment per above

## 2023-02-12 NOTE — Consult Note (Signed)
CODE SEPSIS - PHARMACY COMMUNICATION  **Broad Spectrum Antibiotics should be administered within 1 hour of Sepsis diagnosis**  Time Code Sepsis Called/Page Received: 1328  Antibiotics Ordered: cefepime, vancomycin, Flagyl  Time of 1st antibiotic administration: 1351  Additional action taken by pharmacy: N/A  Barrie Folk ,PharmD Clinical Pharmacist  02/12/2023  1:55 PM

## 2023-02-12 NOTE — Assessment & Plan Note (Addendum)
Acute on chronic, etiology workup in progress Check B12 level PT, OT Fall precautions, aspiration precautions

## 2023-02-12 NOTE — Hospital Course (Addendum)
Ms. Devario Tison is a 77 year old male with history of prior CVA with baseline nonverbal status, insulin-dependent diabetes mellitus, hypertension, hyperlipidemia, who presents emergency department for chief concerns of generalized weakness.  Vitals in the ED showed temperature of 98.2, respiration rate of 21, heart rate of 105, blood pressure 164/95, SpO2 95% on room air.  Serum sodium is 137, potassium 5.4, chloride 109, bicarb 17, BUN 63, serum creatinine is 1.79, EGFR 39, nonfasting blood glucose 278, WBC 12.7, hemoglobin 12.8, platelets of 298.  Lactic acid 2.2. UA was negative for leukocytes and nitrites. Blood cultures x 2 have been collected and are in process.  ED treatment: Sodium chloride 1 L bolus  6/10: Hemodynamically stable.  Labs seems stable.  Blood cultures remain negative.  No urinary cultures obtained.  His presentation was thought to be due to dehydration.  Procalcitonin was negative.  Initially received broad-spectrum antibiotics and IV fluid.  Antibiotics were discontinued on 6/7 and he remained stable and afebrile since then.  Also found to have multiple nutritional deficiencies and started on supplement which include hypophosphatemia, vitamin B-12, folate and vitamin D deficiency. PT is recommending SNF.  Sepsis ruled out as there was no obvious source of infection.  Patient remained medically stable.  Hospitalization got prolonged as having difficulty for SNF placement.  Peer to peer was done and he got finally approved but appears to be close to his baseline.  Patient most likely will need long-term placement as wife is unable to take care of him.  There was a report of calling EMS frequently to change his diapers at home.  He mostly spent his time in a recliner.  Goal is to become strong enough so he can transfer himself from bed to wheelchair and to a recliner at least.  Wife need to work with Medicaid services to see if he can get some extra financial assistance for  his future needs.  He will continue on current medications and need to have a close follow-up with his providers for further recommendations.

## 2023-02-12 NOTE — Assessment & Plan Note (Signed)
-   Aspirin 81 mg daily resumed 

## 2023-02-12 NOTE — Assessment & Plan Note (Addendum)
At baseline, secondary to history of CVA

## 2023-02-12 NOTE — Consult Note (Signed)
Pharmacy Antibiotic Note  Walter Harris is a 77 y.o. male admitted on 02/12/2023 with sepsis.  Pharmacy has been consulted for cefepime and vancomycin dosing.  Plan: Give vancomycin 1500 mg IV x 1 loading dose Start vancomycin 750 mg IV every 24 hours Estimated AUC 421.6, Cmin 12 Wt 75.8 kg, Scr 1.79, Vd coefficient 0.72 Vancomycin levels at steady state or as clinically indicated Start cefepime 2 grams IV every 12 hours Flagyl 500 mg IV every 12 hours per provider Follow renal function and cultures for adjustments  Height: 5\' 8"  (172.7 cm) Weight: 75.8 kg (167 lb 1.7 oz) IBW/kg (Calculated) : 68.4  Temp (24hrs), Avg:98.2 F (36.8 C), Min:98.2 F (36.8 C), Max:98.2 F (36.8 C)  Recent Labs  Lab 02/12/23 1009 02/12/23 1109  WBC  --  12.7*  CREATININE  --  1.79*  LATICACIDVEN 2.2*  --     Estimated Creatinine Clearance: 34 mL/min (A) (by C-G formula based on SCr of 1.79 mg/dL (H)).    Allergies  Allergen Reactions   Bee Venom Anaphylaxis   Pravastatin Other (See Comments)    Severe myalgias   Ace Inhibitors     Cough   Actos [Pioglitazone] Other (See Comments)    Intolerant, not allergic.    Angiotensin Receptor Blockers Other (See Comments)    cramps   Aripiprazole Other (See Comments)    Unknown reaction many years ago   Crestor [Rosuvastatin Calcium] Other (See Comments)    Aches, even with twice weekly dosing   Invokana [Canagliflozin]     Edema   Metformin And Related Other (See Comments)    Able to tolerate XR formulation, not allergic.    Nsaids     Would avoid given creatinine elevation.   Olanzapine Other (See Comments)    Unknown reaction many years ago   Pneumovax 23 [Pneumococcal Vac Polyvalent] Other (See Comments)    aches   Codeine Rash   Divalproex Sodium Other (See Comments)     tremor   Lithium Carbonate Other (See Comments)    Tremors    Sulfamethoxazole-Trimethoprim Other (See Comments)    Mild reaction per Dr. Alphonsus Sias, pt does not  remember the reaction    Antimicrobials this admission: vancomycin 6/5 >>  cefepime 6/5 >>  Flagyl 6/5>>  Dose adjustments this admission: N/A  Microbiology results: 6/5 BCx: pending  Thank you for allowing pharmacy to be a part of this patient's care.  Barrie Folk, PharmD 02/12/2023 1:35 PM

## 2023-02-12 NOTE — H&P (Signed)
History and Physical   Walter Harris:[id number] DOB: Jan 20, 1946 DOA: 02/12/2023  PCP: Joaquim Nam, MD  Patient coming from: home via EMS  I have personally briefly reviewed patient's old medical records in [facility] EMR.  Chief Concern: weakness  HPI: [name] is a 77 year old male with history of prior CVA with baseline nonverbal status, insulin-dependent diabetes mellitus, hypertension, hyperlipidemia, who presents emergency department for chief concerns of generalized weakness.  Vitals in the ED showed temperature of 98.2, respiration rate of 21, heart rate of 105, blood pressure 164/95, SpO2 95% on room air.  Serum sodium is 137, potassium 5.4, chloride 109, bicarb 17, BUN 63, serum creatinine is 1.79, EGFR 39, nonfasting blood glucose 278, WBC 12.7, hemoglobin 12.8, platelets of 298.  Lactic acid 2.2. UA was negative for leukocytes and nitrites. Blood cultures x 2 have been collected and are in process.  ED treatment: Sodium chloride 1 L bolus ----------------------- At bedside, patient is nonverbal.  He moans in discomfort when nursing will see him so that we can assess sacral pressure injury.  Spouse at bedside who is his primary caregiver.  She states that at baseline patient does not ambulate he is mostly bedbound and/or wheelchair-bound.  Spouse reports that at baseline he helps with  with transitioning from bed to wheelchair and he can feed himself.  Over the last 3 days, patient has not been able to help participate transition from bed to wheelchair and or feed himself.  He has not been able to participate in changing of his diaper and spouse cannot do it on her own.  She denies fever, vomiting, diarrhea over the last week.   Social history: Patient lives at home with her wife.  He is not a current tobacco user, EtOH, recreational drug use.  ROS: Unable to complete as patient is nonverbal  ED Course: Discussed with emergency medicine provider, patient  requiring hospitalization for chief concerns of weakness.  Assessment/Plan  Principal Problem:   Severe sepsis (HCC) Active Problems:   HLD (hyperlipidemia)   Essential hypertension   Insulin dependent type 2 diabetes mellitus (HCC)   Diabetic polyneuropathy (HCC)   Anxiety state   Tremor, essential   History of CVA in adulthood   Dupuytren contracture   Insomnia   Pressure injury of skin   Hyperkalemia   Nonverbal   Altered mental status   Weakness generalized   Assessment and Plan:  * Severe sepsis (HCC) Patient had a low bated heart rate, respiration rate, leukocytosis of 12.7, elevated lactic acid of 2.2, organ involvement is renal Blood cultures x 2 are in process Check procalcitonin, if elevated we will initiate atypical pneumonia coverage Vancomycin, cefepime per pharmacy ordered; metronidazole 500 mg IV twice daily ordered Sodium chloride 1.250 L ordered on admission to complete sepsis bolus Telemetry cardiac, inpatient  Weakness generalized Acute on chronic, etiology workup in progress Check B12 level PT, OT Fall precautions, aspiration precautions  Altered mental status Etiology workup in progress, differentials include severe sepsis with dehydration complicated by nonverbal baseline state in setting of history of CVA Blood cultures in process Treatment per above  Nonverbal At baseline, secondary to history of CVA  Hyperkalemia Presumed secondary to acute kidney injury Status post sepsis bolus ordered Post sepsis ordered sodium chloride infusion at 125 mL/h, 1 day ordered Repeat BMP in the a.m.  Pressure injury of skin Present on admission Wound care consulted  History of CVA in adulthood Aspirin 81 mg daily resumed  Insulin  dependent type 2 diabetes mellitus (HCC) Insulin SSI with at bedtime coverage Goal inpatient blood glucose levels 140-180  Essential hypertension Hydralazine 5 mg IV every 8 hours as needed for SBP greater than 175, 5 days  ordered  Chart reviewed.   DVT prophylaxis: heart healthy  Code Status: full code Diet: NPO pending SLP evaluation Family Communication: updated and discuss with spouse at bedside Disposition Plan: pending clinical course Consults called: SLP Admission status: telemetry cardiac, inpatient  Past Medical History:  Diagnosis Date   Basal cell carcinoma    multiple   Cataracts, both eyes 2019   surgery pending April 2020   Complication of anesthesia    post-operative cognitive dysfunction after ACDF on 05/01/11 [facility])   Deaf    right ear   Diabetes mellitus    DJD (degenerative joint disease)    lumbar spine   Gait abnormality 11/12/2019   Hearing impaired    hearing aids-transmitter rt    History of blood transfusion    as a baby in 1948   History of colonic polyps    History of kidney stones    Hyperlipidemia    Hypertension    pt. denies high blood pressure   Memory change 12/17/2017   Nose fracture    3x   OCD (obsessive compulsive disorder)    Stroke (HCC) 12/06/2019   "mini stroke, that's what gave me the tremors"   Stuttering    Tremor, essential    Vertigo    random   Wears dentures    full upper   Wears glasses    Past Surgical History:  Procedure Laterality Date   APPENDECTOMY  1960   BACK SURGERY  2012   lumbar surgery   CATARACT EXTRACTION W/PHACO Right 04/12/2019   Procedure: CATARACT EXTRACTION PHACO AND INTRAOCULAR LENS PLACEMENT (IOC) RIGHT, DIABETIC;  Surgeon: Nevada Crane, MD;  Location: [facility];  Service: Ophthalmology;  Laterality: Right;  Diabetic - insulin and oral meds   CATARACT EXTRACTION W/PHACO Left 05/10/2019   Procedure: CATARACT EXTRACTION PHACO AND INTRAOCULAR LENS PLACEMENT (IOC)  LEFT DIABETIC  00:25.6  14.4%  3.75;  Surgeon: Nevada Crane, MD;  Location: [facility];  Service: Ophthalmology;  Laterality: Left;  Diabetic - insulin and oral meds   CERVICAL FUSION  1990   CERVICAL FUSION  8/12   multiple  levels with plates   CHOLESTEATOMA EXCISION  1993   COLLATERAL LIGAMENT REPAIR, ELBOW     bilateral, nerve improvement left 08/04, right 09/04   COLONOSCOPY     CYST EXCISION     back of cervical spine total of 5 surgeries   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   Procedure: DUPUYTREN CONTRACTURE RELEASE;  Surgeon: Christena Flake, MD;  Location: [facility];  Service: Orthopedics;  Laterality: Left;   EXTERNAL EAR SURGERY     multiple ear surguries as a child   OTHER SURGICAL HISTORY     benign head tumor#5   OTHER SURGICAL HISTORY     sebaceous cysts-post neck x5   SHOULDER ARTHROSCOPY W/ ACROMIAL REPAIR  08/2004   left shoulder   TONSILLECTOMY     ULNAR NERVE TRANSPOSITION Right 05/13/2013   Procedure: RIGHT ULNAR NERVE DECOMPRESSION/TRANSPOSITION;  Surgeon: Wyn Forster., MD;  Location: [facility];  Service: Orthopedics;  Laterality: Right;   Social History:  reports that he quit smoking about 6 years ago. His smoking use included pipe and cigarettes. He has a 20.00 pack-year  smoking history. He has never used smokeless tobacco. He reports that he does not drink alcohol and does not use drugs.  Allergies  Allergen Reactions   Bee Venom Anaphylaxis   Pravastatin Other (See Comments)    Severe myalgias   Ace Inhibitors     Cough   Actos [Pioglitazone] Other (See Comments)    Intolerant, not allergic.    Angiotensin Receptor Blockers Other (See Comments)    cramps   Aripiprazole Other (See Comments)    Unknown reaction many years ago   Crestor [Rosuvastatin Calcium] Other (See Comments)    Aches, even with twice weekly dosing   Invokana [Canagliflozin]     Edema   Metformin And Related Other (See Comments)    Able to tolerate XR formulation, not allergic.    Nsaids     Would avoid given creatinine elevation.   Olanzapine Other (See Comments)    Unknown reaction many years ago   Pneumovax 23 [Pneumococcal Vac Polyvalent] Other (See Comments)    aches    Codeine Rash   Divalproex Sodium Other (See Comments)     tremor   Lithium Carbonate Other (See Comments)    Tremors    Sulfamethoxazole-Trimethoprim Other (See Comments)    Mild reaction per Dr. Alphonsus Sias, pt does not remember the reaction   Family History  Problem Relation Age of Onset   OCD Mother    Bipolar disorder Mother    Cancer Mother    Emphysema Mother        never smoker, worked @ [employer] scientific [employer]   Glaucoma Father    Arthritis Sister    OCD Sister    Colon cancer Neg Hx    Prostate cancer Neg Hx    Family history: Family history reviewed and not pertinent.  Prior to Admission medications   Medication Sig Start Date End Date Taking? Authorizing Provider  irbesartan (AVAPRO) 150 MG tablet Take 150 mg by mouth daily. 01/23/23  Yes [provider]  ALPRAZolam (XANAX) 0.5 MG tablet TAKE ONE TABLET BY MOUTH TWICE DAILY WITH AN EXTRA 1/2 TABLET IF NEEDED. MAY CAUSE DROWSINESS. 12/23/22   Joaquim Nam, MD  aspirin 81 MG EC tablet Take 1 tablet (81 mg total) by mouth daily. For stroke prevention Patient taking differently: Take 81 mg by mouth daily with lunch. For stroke prevention 05/17/20   Joaquim Nam, MD  BD PEN NEEDLE NANO U/F 32G X 4 MM MISC USE AS DIRECTED 07/26/20   Joaquim Nam, MD  clopidogrel (PLAVIX) 75 MG tablet TAKE 1 TABLET BY MOUTH DAILY 03/05/22   Joaquim Nam, MD  FLUoxetine (PROZAC) 10 MG capsule Take 3 capsules (30 mg total) by mouth daily with lunch. TAKE 3 CAPSULES BY MOUTH DAILY FOR MOOD AS DIRECTED 04/16/22   Poggi, Excell Seltzer, MD  glipiZIDE (GLUCOTROL XL) 10 MG 24 hr tablet Take 1 tablet (10 mg total) by mouth daily with breakfast. For diabetes 05/17/20   Joaquim Nam, MD  insulin glargine (LANTUS SOLOSTAR) 100 UNIT/ML Solostar Pen Inject 20 Units into the skin at bedtime. For diabetes 04/16/22   Poggi, Excell Seltzer, MD  metFORMIN (GLUCOPHAGE-XR) 500 MG 24 hr tablet TAKE ONE TABLET 3 TIMES DAILY for diabetes 05/17/20   Joaquim Nam, MD   oxyCODONE-acetaminophen (PERCOCET/ROXICET) 5-325 MG tablet TAKE ONE TO TWO TABLETS BY MOUTH 3 TIMESDAILY AS NEEDED FOR PAIN.  Fill on/after 30 days from rx 12/12/22   Joaquim Nam, MD  oxyCODONE-acetaminophen (PERCOCET/ROXICET) [phone]  MG tablet TAKE 1-2 TABLETS BY MOUTH 3 TIMES DAILY AS NEEDED FOR PAIN. Fill on/after 60 days from rx 12/12/22   Joaquim Nam, MD  oxyCODONE-acetaminophen (PERCOCET/ROXICET) 5-325 MG tablet TAKE ONE TO TWO TABLETS BY MOUTH 3 TIMES DAILY IF NEEDED FOR PAIN. 12/12/22   Joaquim Nam, MD  primidone (MYSOLINE) 50 MG tablet TAKE 1 TABLET BY MOUTH DAILY 01/01/23   Joaquim Nam, MD  propranolol ER (INDERAL LA) 60 MG 24 hr capsule TAKE 1 CAPSULE BY MOUTH ONCE DAILY 12/23/22   Joaquim Nam, MD  tiZANidine (ZANAFLEX) 4 MG tablet TAKE ONE TABLET 3 TIMES DAILY AS NEEDED FOR MUSCLE SPASM MAY TAKE 1 EXTRADOSE DAILY AS NEEDED 01/01/22   Joaquim Nam, MD  traZODone (DESYREL) 50 MG tablet TAKE 1 AND 1/2 TABLETS BY MOUTH AT BEDTIME AS NEEDED FOR SLEEP 09/25/22   Joaquim Nam, MD   Physical Exam: Vitals:   02/12/23 1130 02/12/23 1224 02/12/23 1225 02/12/23 1300  BP: (!) 164/95 (!) 140/87  (!) 149/72  Pulse: (!) 105 98  84  Resp: (!) 21 19  16   Temp:      SpO2: 95% 96%  94%  Weight:   75.8 kg   Height:   5\' 8"  (1.727 m)    Constitutional: appears frail, chronically ill, cachectic Eyes: PERRL, lids and conjunctivae normal ENMT: Mucous membranes are dry. Posterior pharynx clear of any exudate or lesions. Age-appropriate dentition.  Unable to assess hearing as patient is nonverbal at baseline Neck: normal, supple, no masses, no thyromegaly Respiratory: clear to auscultation bilaterally, no wheezing, no crackles. Normal respiratory effort. No accessory muscle use.  Cardiovascular: Regular rate and rhythm, no murmurs / rubs / gallops.  Bilateral trace lower extremity edema. 2+ pedal pulses. No carotid bruits.  Abdomen: no tenderness, no masses palpated, no  hepatosplenomegaly. Bowel sounds positive.  Musculoskeletal: no clubbing / cyanosis. No joint deformity upper and lower extremities. Good ROM, no contractures, no atrophy. Normal muscle tone.   Skin: no rashes, lesions, ulcers. No induration. Bilateral hip pressure injury without skin breakdown    Neurologic: Unable to assess sensation, strength as patient is bedbound with history of CVA   Psychiatric: Unable to assess judgment, insight, alertness, orientation, mood.  EKG: independently reviewed, showing sinus tachycardia with rate of 110, QTc 489  Chest x-ray on Admission: I personally reviewed and I agree with radiologist reading as below.  DG Chest 2 View  Result Date: 02/12/2023 CLINICAL DATA:  Suspected sepsis. EXAM: CHEST - 2 VIEW COMPARISON:  07/31/2020 and 12/06/2019 FINDINGS: Two views of the chest were obtained. Low lung volumes are similar to prior examinations. Vague opacity along the periphery of the left lung base is probably unchanged since 2021. Otherwise, the lungs are clear. Heart size is normal. Surgical plates in the cervical spine. Negative for a pneumothorax. Multilevel degenerative endplate changes in the thoracic spine. No large pleural effusions. IMPRESSION: No active cardiopulmonary disease. Electronically Signed   By: Richarda Overlie M.D.   On: 02/12/2023 12:02    Labs on Admission: I have personally reviewed following labs  CBC: Recent Labs  Lab 02/12/23 1109  WBC 12.7*  NEUTROABS 11.1*  HGB 12.8*  HCT 37.6*  MCV 90.4  PLT 298   Basic Metabolic Panel: Recent Labs  Lab 02/12/23 1109  NA 137  K 5.4*  CL 109  CO2 17*  GLUCOSE 278*  BUN 63*  CREATININE 1.79*  CALCIUM 8.4*   GFR: Estimated Creatinine Clearance: 34  mL/min (A) (by C-G formula based on SCr of 1.79 mg/dL (H)).  Liver Function Tests: Recent Labs  Lab 02/12/23 1109  AST 69*  ALT 42  ALKPHOS 65  BILITOT 1.3*  PROT 6.5  ALBUMIN 3.3*   Coagulation Profile: Recent Labs  Lab  02/12/23 1109  INR 1.1   Urine analysis:    Component Value Date/Time   COLORURINE YELLOW (A) 02/12/2023 1138   APPEARANCEUR CLEAR (A) 02/12/2023 1138   LABSPEC 1.018 02/12/2023 1138   PHURINE 5.0 02/12/2023 1138   GLUCOSEU 150 (A) 02/12/2023 1138   HGBUR MODERATE (A) 02/12/2023 1138   BILIRUBINUR NEGATIVE 02/12/2023 1138   BILIRUBINUR Neg 08/08/2016 1550   KETONESUR 5 (A) 02/12/2023 1138   PROTEINUR 100 (A) 02/12/2023 1138   UROBILINOGEN 0.2 08/08/2016 1550   NITRITE NEGATIVE 02/12/2023 1138   LEUKOCYTESUR NEGATIVE 02/12/2023 1138   CRITICAL CARE Performed by: Dr. Sedalia Muta  Total critical care time: 32 minutes  Critical care time was exclusive of separately billable procedures and treating other patients.  Critical care was necessary to treat or prevent imminent or life-threatening deterioration.  Critical care was time spent personally by me on the following activities: development of treatment plan with patient and/or surrogate as well as nursing, discussions with consultants, evaluation of patient's response to treatment, examination of patient, obtaining history from patient or surrogate, ordering and performing treatments and interventions, ordering and review of laboratory studies, ordering and review of radiographic studies, pulse oximetry and re-evaluation of patient's condition.  This document was prepared using Dragon Voice Recognition software and may include unintentional dictation errors.  Dr. Sedalia Muta [facility]  If 7PM-7AM, please contact overnight-coverage provider If 7AM-7PM, please contact day attending provider [email or web address]  02/12/2023, 2:30 PM

## 2023-02-12 NOTE — Assessment & Plan Note (Addendum)
Present on admission Wound care consulted

## 2023-02-12 NOTE — Assessment & Plan Note (Signed)
Presumed secondary to acute kidney injury Status post sepsis bolus ordered Post sepsis ordered sodium chloride infusion at 125 mL/h, 1 day ordered Repeat BMP in the a.m.

## 2023-02-12 NOTE — Assessment & Plan Note (Signed)
-   Hydralazine 5 mg IV every 8 hours as needed for SBP greater than 175, 5 days ordered 

## 2023-02-12 NOTE — ED Provider Notes (Signed)
[facility] Provider Note    Event Date/Time   First MD Initiated Contact with Patient 02/12/23 1121     (approximate)  History   Chief Complaint: Code Sepsis  HPI  Walter Harris is a 77 y.o. male with a past medical history of diabetes, hypertension, hyperlipidemia, prior CVA, nonverbal presents to the emergency department with generalized weakness.  According to EMS report patient is coming from home and the wife is the primary caregiver of the patient.  EMS states they have been called out 3 days in a row now to help change the patient as he has been too weak to help the wife change him.  Today they noted him to also be tachycardic and tachypneic meeting sepsis criteria so they brought the patient to the emergency department for evaluation.  Here the patient is awake but he is nonverbal does not answer questions or follow commands.  Physical Exam   Triage Vital Signs: ED Triage Vitals  Enc Vitals Group     BP 02/12/23 1108 (!) 147/94     Pulse Rate 02/12/23 1106 (!) 112     Resp 02/12/23 1106 18     Temp 02/12/23 1108 98.2 F (36.8 C)     Temp src --      SpO2 02/12/23 1106 95 %     Weight --      Height --      Head Circumference --      Peak Flow --      Pain Score --      Pain Loc --      Pain Edu? --      Excl. in GC? --     Most recent vital signs: Vitals:   02/12/23 1108 02/12/23 1130  BP: (!) 147/94 (!) 164/95  Pulse: (!) 108 (!) 105  Resp: 19 (!) 21  Temp: 98.2 F (36.8 C)   SpO2:  95%    General: Awake, no distress.  CV:  Good peripheral perfusion.  Regular rate and rhythm around 100 bpm. Resp:  Normal effort.  Equal breath sounds bilaterally.  No wheeze rales or rhonchi. Abd:  No distention.  Soft, slight grimace to suprapubic palpation.  No rebound or guarding.  ED Results / Procedures / Treatments   EKG  EKG viewed and interpreted by myself shows sinus tachycardia 110 bpm with a narrow QRS, normal axis, normal  intervals, no concerning ST changes.  RADIOLOGY  I have reviewed and interpreted chest x-ray images.  No consolidation seen on my evaluation.  Radiology is read the x-ray is essentially negative.   MEDICATIONS ORDERED IN ED: Medications - No data to display   IMPRESSION / MDM / ASSESSMENT AND PLAN / ED COURSE  I reviewed the triage vital signs and the nursing notes.  Patient's presentation is most consistent with acute presentation with potential threat to life or bodily function.  Patient presents emergency department for increased weakness over baseline as well as tachycardia and tachypnea.  Patient is nonverbal at baseline per report.  Not answering questions or following commands in the ED but is awake and will look at you.  Given the patient's increased weakness tachycardia and tachypnea we will check labs obtain chest x-ray, urinalysis.  Will continue to closely monitor IV hydrate while awaiting results.  We will hold off on antibiotics until lab results are known.  Afebrile.  Patient's workup shows overall reassuring chemistry with mild signs of dehydration such as mild hyperkalemia  and renal insufficiency.  CBC shows no significant finding troponin not significantly elevated.  Urinalysis shows no obvious sign of infection.  Given the patient's generalized weakness with significant increased weakness of her baseline per report and labs consistent with dehydration and renal insufficiency mild hyperkalemia we will admit the patient to the hospital service for further workup and treatment.  FINAL CLINICAL IMPRESSION(S) / ED DIAGNOSES   Weakness   Note:  This document was prepared using Dragon voice recognition software and may include unintentional dictation errors.   Minna Antis, MD 02/13/23 [phone]

## 2023-02-12 NOTE — Consult Note (Signed)
WOC Nurse Consult Note: Reason for Consult:pressure injury Patient from home, CG has contacted EMS for 3 days in a row to change incontinence brief. Nonverbal patient, history of CVA, DM, HTN. Admitted for code sepsis Wound type: Stage 2 Pressure injury; left trochanter  Pressure Injury POA: Yes Measurement: see nursing flow sheets Wound bed: redness with superficial skin loss centrally, partial thickness  Drainage (amount, consistency, odor) none Periwound: intact  Dressing procedure/placement/frequency: Ok to treat with nursing skin care protocol; silicone foam dressing to protect and insulate. Change every 3 days, assess under each shift for acute changes in the wound.  Turn and reposition at least every two hours.    Daley Gosse [facility], CNS, [employer] [phone]

## 2023-02-12 NOTE — ED Triage Notes (Signed)
Pt to ED [facility] from home code sepsis. Ems reports has been to pts house the past 3 mornings to help wife change briefs because he has been unable to assist.  Baseline non verbal d/t stroke deficit.  Ems reports strong odor to urine fentanyl, 4 mg zofran PTA, 18 R wrist EMS. Chronic back pain.  Cbg 336

## 2023-02-13 ENCOUNTER — Encounter: Payer: Self-pay | Admitting: Internal Medicine

## 2023-02-13 DIAGNOSIS — R6520 Severe sepsis without septic shock: Secondary | ICD-10-CM | POA: Diagnosis not present

## 2023-02-13 DIAGNOSIS — A419 Sepsis, unspecified organism: Secondary | ICD-10-CM | POA: Diagnosis not present

## 2023-02-13 LAB — IRON AND TIBC
Iron: 45 ug/dL (ref 45–182)
Saturation Ratios: 22 % (ref 17.9–39.5)
TIBC: 202 ug/dL — ABNORMAL LOW (ref 250–450)
UIBC: 157 ug/dL

## 2023-02-13 LAB — FOLATE: Folate: 3.5 ng/mL — ABNORMAL LOW (ref >5.9)

## 2023-02-13 LAB — TROPONIN I (HIGH SENSITIVITY)
Troponin I (High Sensitivity): 55 ng/L — ABNORMAL HIGH (ref <18)
Troponin I (High Sensitivity): 31 ng/L — ABNORMAL HIGH (ref <18)

## 2023-02-13 LAB — BASIC METABOLIC PANEL
Anion gap: 8 (ref 5–15)
BUN: 46 mg/dL — ABNORMAL HIGH (ref 8–23)
CO2: 17 mmol/L — ABNORMAL LOW (ref 22–32)
Calcium: 7.9 mg/dL — ABNORMAL LOW (ref 8.9–10.3)
Chloride: 115 mmol/L — ABNORMAL HIGH (ref 98–111)
Creatinine, Ser: 1.33 mg/dL — ABNORMAL HIGH (ref 0.61–1.24)
GFR, Estimated: 55 mL/min — ABNORMAL LOW (ref >60)
Glucose, Bld: 144 mg/dL — ABNORMAL HIGH (ref 70–99)
Potassium: 4.4 mmol/L (ref 3.5–5.1)
Sodium: 140 mmol/L (ref 135–145)

## 2023-02-13 LAB — CBC
HCT: 33.4 % — ABNORMAL LOW (ref 39.0–52.0)
Hemoglobin: 11 g/dL — ABNORMAL LOW (ref 13.0–17.0)
MCH: 30.8 pg (ref 26.0–34.0)
MCHC: 32.9 g/dL (ref 30.0–36.0)
MCV: 93.6 fL (ref 80.0–100.0)
Platelets: 223 10*3/uL (ref 150–400)
RBC: 3.57 MIL/uL — ABNORMAL LOW (ref 4.22–5.81)
RDW: 12.6 % (ref 11.5–15.5)
WBC: 9.7 10*3/uL (ref 4.0–10.5)
nRBC: 0 % (ref 0.0–0.2)

## 2023-02-13 LAB — CBG MONITORING, ED
Glucose-Capillary: 123 mg/dL — ABNORMAL HIGH (ref 70–99)
Glucose-Capillary: 143 mg/dL — ABNORMAL HIGH (ref 70–99)
Glucose-Capillary: 98 mg/dL (ref 70–99)

## 2023-02-13 LAB — CULTURE, BLOOD (ROUTINE X 2)
Culture: NO GROWTH
Special Requests: ADEQUATE

## 2023-02-13 LAB — VITAMIN D 25 HYDROXY (VIT D DEFICIENCY, FRACTURES): Vit D, 25-Hydroxy: 13.51 ng/mL — ABNORMAL LOW (ref 30–100)

## 2023-02-13 LAB — GLUCOSE, CAPILLARY: Glucose-Capillary: 102 mg/dL — ABNORMAL HIGH (ref 70–99)

## 2023-02-13 LAB — CORTISOL-AM, BLOOD: Cortisol - AM: 21.2 ug/dL (ref 6.7–22.6)

## 2023-02-13 MED ORDER — VITAMIN D (ERGOCALCIFEROL) 1.25 MG (50000 UNIT) PO CAPS
50000.0000 [IU] | ORAL_CAPSULE | ORAL | Status: DC
  Administered 2023-02-13 – 2023-02-20 (×2): 50000 [IU] via ORAL
  Filled 2023-02-13 (×2): qty 1

## 2023-02-13 MED ORDER — LACTATED RINGERS IV SOLN: INTRAVENOUS | Status: AC

## 2023-02-13 MED ORDER — CYANOCOBALAMIN 1000 MCG/ML IJ SOLN
1000.0000 ug | Freq: Every day | INTRAMUSCULAR | Status: AC
  Administered 2023-02-13 – 2023-02-19 (×7): 1000 ug via INTRAMUSCULAR
  Filled 2023-02-13 (×7): qty 1

## 2023-02-13 MED ORDER — FOLIC ACID 1 MG PO TABS
1.0000 mg | ORAL_TABLET | Freq: Every day | ORAL | Status: DC
  Administered 2023-02-13 – 2023-02-24 (×12): 1 mg via ORAL
  Filled 2023-02-13 (×12): qty 1

## 2023-02-13 MED ORDER — VITAMIN B-12 1000 MCG PO TABS
1000.0000 ug | ORAL_TABLET | Freq: Every day | ORAL | Status: DC
  Administered 2023-02-20 – 2023-02-24 (×5): 1000 ug via ORAL
  Filled 2023-02-13 (×5): qty 1

## 2023-02-13 MED ORDER — SODIUM BICARBONATE 650 MG PO TABS
650.0000 mg | ORAL_TABLET | Freq: Three times a day (TID) | ORAL | Status: DC
  Administered 2023-02-13 – 2023-02-14 (×4): 650 mg via ORAL
  Filled 2023-02-13 (×4): qty 1

## 2023-02-13 MED ORDER — VANCOMYCIN HCL 1250 MG/250ML IV SOLN
1250.0000 mg | INTRAVENOUS | Status: DC
  Administered 2023-02-13: 1250 mg via INTRAVENOUS
  Filled 2023-02-13 (×3): qty 250

## 2023-02-13 NOTE — TOC Initial Note (Signed)
Transition of Care [facility]) - Initial/Assessment Note    Patient Details  Name: Walter Harris MRN: [id number] Date of Birth: [birth year]  Transition of Care [facility]) CM/SW Contact:    Darolyn Rua, LCSW Phone Number: 02/13/2023, 3:31 PM  Clinical Narrative:                  CSW spoke with patient's spouse [name] regarding PT recommendations for SNF, she reports not being surprised by this. Reports patient is nonverbal and stroke hx, will speak very rarely. Reports he has been staying in his recliner and sleeping there as she's been physically unable to move him to his bed, as she is 77 years old. Reports she would like to try short term rehab, understands he may need long term in the future. Reports they have not applied for medicaid.   [name] is agreeable for CSW to send out local referrals to SNF and is open to being connected with the financial counselor to review if he qualifies for Medicaid.   Pending bed offers at this time.   Expected Discharge Plan: Skilled Nursing Facility Barriers to Discharge: Continued Medical Work up   Patient Goals and CMS Choice Patient states their goals for this hospitalization and ongoing recovery are:: to go home CMS Medicare.gov Compare Post Acute Care list provided to:: Patient Represenative (must comment) (spouse [name])   [place] ownership interest in [facility].provided to:: Spouse    Expected Discharge Plan and Services       Living arrangements for the past 2 months: Single Family Home                                      Prior Living Arrangements/Services Living arrangements for the past 2 months: Single Family Home Lives with:: Spouse                   Activities of Daily Living      Permission Sought/Granted                  Emotional Assessment              Admission diagnosis:  Severe sepsis (HCC) [A41.9, R65.20] Patient Active Problem List   Diagnosis Date Noted   Severe sepsis (HCC)  02/12/2023   Pressure injury of skin 02/12/2023   Hyperkalemia 02/12/2023   Nonverbal 02/12/2023   Altered mental status 02/12/2023   Weakness generalized 02/12/2023   Insomnia 01/09/2022   Decreased dorsalis pedis pulse 01/09/2022   Dupuytren contracture 02/21/2021   Lower extremity edema 02/21/2021   Left shoulder pain 11/11/2020   Statin myopathy 09/10/2020   Fall at home 08/09/2020   History of CVA in adulthood 12/07/2019   Gait abnormality 11/12/2019   Tremor, essential 11/12/2019   Health care maintenance 10/04/2018   Thumb pain 10/04/2018   Memory change 12/17/2017   Paresthesia 12/03/2017   Lightheaded 08/28/2017   Encounter for chronic pain management 06/18/2017   Neck pain 05/14/2017   Panic 01/10/2017   Muscle ache 10/05/2015   Syncope 07/08/2015   Chronic back pain 04/12/2015   Medicare annual wellness visit, initial 03/08/2015   Advance care planning 03/08/2015   Dysgeusia 11/28/2014   Anxiety state 10/27/2014   Diabetic polyneuropathy (HCC) 02/14/2014   Rhinitis, chronic 01/13/2012   Basal cell carcinoma    LUMBAR RADICULOPATHY 10/16/2010   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 [place] [address] Phone: [phone] Fax: [phone]     Social Determinants of Health (SDOH) Social History: SDOH Screenings   Food Insecurity: No Food Insecurity (11/01/2022)  Housing: Low Risk  (11/01/2022)  Transportation Needs: No Transportation Needs (11/01/2022)  Utilities: Not At Risk (11/01/2022)  Alcohol Screen: Low Risk  (10/31/2021)  Depression (PHQ2-9): Low Risk  (11/01/2022)  Financial Resource Strain: Low Risk  (11/01/2022)  Physical Activity: Inactive  (11/01/2022)  Social Connections: Socially Isolated (11/01/2022)  Stress: No Stress Concern Present (11/01/2022)  Tobacco Use: Medium Risk (11/01/2022)   SDOH Interventions:     Readmission Risk Interventions     No data to display

## 2023-02-13 NOTE — Consult Note (Signed)
Pharmacy Antibiotic Note  Walter Harris is a 77 y.o. male admitted on 02/12/2023 with sepsis.  Pharmacy has been consulted for cefepime and vancomycin dosing.  Plan: Give vancomycin 1500 mg IV x 1 loading dose Adjust vancomycin to 1250 mg IV every 24 hours Estimated AUC 540, Cmin 13.8 Wt 75.8 kg, Scr 1.33, Vd coefficient 0.72 Vancomycin levels at steady state or as clinically indicated Continue cefepime 2 grams IV every 12 hours Continue Flagyl 500 mg IV every 12 hours per provider Follow renal function and cultures for adjustments  Height: 5\' 8"  (172.7 cm) Weight: 75.8 kg (167 lb 1.7 oz) IBW/kg (Calculated) : 68.4  Temp (24hrs), Avg:97.4 F (36.3 C), Min:97.1 F (36.2 C), Max:98.2 F (36.8 C)  Recent Labs  Lab 02/12/23 1009 02/12/23 1109 02/12/23 1505 02/13/23 0510  WBC  --  12.7*  --  9.7  CREATININE  --  1.79*  --  1.33*  LATICACIDVEN 2.2*  --  1.8  --      Estimated Creatinine Clearance: 45.7 mL/min (A) (by C-G formula based on SCr of 1.33 mg/dL (H)).    Allergies  Allergen Reactions   Bee Venom Anaphylaxis   Pravastatin Other (See Comments)    Severe myalgias   Ace Inhibitors     Cough   Actos [Pioglitazone] Other (See Comments)    Intolerant, not allergic.    Angiotensin Receptor Blockers Other (See Comments)    cramps   Aripiprazole Other (See Comments)    Unknown reaction many years ago   Crestor [Rosuvastatin Calcium] Other (See Comments)    Aches, even with twice weekly dosing   Invokana [Canagliflozin]     Edema   Metformin And Related Other (See Comments)    Able to tolerate XR formulation, not allergic.    Nsaids     Would avoid given creatinine elevation.   Olanzapine Other (See Comments)    Unknown reaction many years ago   Pneumovax 23 [Pneumococcal Vac Polyvalent] Other (See Comments)    aches   Codeine Rash   Divalproex Sodium Other (See Comments)     tremor   Lithium Carbonate Other (See Comments)    Tremors     Sulfamethoxazole-Trimethoprim Other (See Comments)    Mild reaction per Dr. Alphonsus Sias, pt does not remember the reaction    Antimicrobials this admission: vancomycin 6/5 >>  cefepime 6/5 >>  Flagyl 6/5>>  Dose adjustments this admission: 6/6 Vanco 750 > 1250 mg Q24H  Microbiology results: 6/5 BCx: pending  Thank you for allowing pharmacy to be a part of this patient's care.  Sharen Hones, PharmD, BCPS Clinical Pharmacist   02/13/2023 7:25 AM

## 2023-02-13 NOTE — Evaluation (Signed)
Clinical/Bedside Swallow Evaluation Patient Details  Name: Walter Harris MRN: [id number] Date of Birth: May 31, 1946  Today's Date: 02/13/2023 Time: SLP Start Time (ACUTE ONLY): 0820 SLP Stop Time (ACUTE ONLY): 9147 SLP Time Calculation (min) (ACUTE ONLY): 13 min  Past Medical History:  Past Medical History:  Diagnosis Date   Basal cell carcinoma    multiple   Cataracts, both eyes 2019   surgery pending April 2020   Complication of anesthesia    post-operative cognitive dysfunction after ACDF on 05/01/11 [facility])   Deaf    right ear   Diabetes mellitus    DJD (degenerative joint disease)    lumbar spine   Gait abnormality 11/12/2019   Hearing impaired    hearing aids-transmitter rt    History of blood transfusion    as a baby in 1948   History of colonic polyps    History of kidney stones    Hyperlipidemia    Hypertension    pt. denies high blood pressure   Memory change 12/17/2017   Nose fracture    3x   OCD (obsessive compulsive disorder)    Stroke (HCC) 12/06/2019   "mini stroke, that's what gave me the tremors"   Stuttering    Tremor, essential    Vertigo    random   Wears dentures    full upper   Wears glasses    Past Surgical History:  Past Surgical History:  Procedure Laterality Date   APPENDECTOMY  1960   BACK SURGERY  2012   lumbar surgery   CATARACT EXTRACTION W/PHACO Right 04/12/2019   Procedure: CATARACT EXTRACTION PHACO AND INTRAOCULAR LENS PLACEMENT (IOC) RIGHT, DIABETIC;  Surgeon: Nevada Crane, MD;  Location: [facility];  Service: Ophthalmology;  Laterality: Right;  Diabetic - insulin and oral meds   CATARACT EXTRACTION W/PHACO Left 05/10/2019   Procedure: CATARACT EXTRACTION PHACO AND INTRAOCULAR LENS PLACEMENT (IOC)  LEFT DIABETIC  00:25.6  14.4%  3.75;  Surgeon: Nevada Crane, MD;  Location: [facility];  Service: Ophthalmology;  Laterality: Left;  Diabetic - insulin and oral meds   CERVICAL FUSION  1990   CERVICAL FUSION   8/12   multiple levels with plates   CHOLESTEATOMA EXCISION  1993   COLLATERAL LIGAMENT REPAIR, ELBOW     bilateral, nerve improvement left 08/04, right 09/04   COLONOSCOPY     CYST EXCISION     back of cervical spine total of 5 surgeries   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   Procedure: DUPUYTREN CONTRACTURE RELEASE;  Surgeon: Christena Flake, MD;  Location: [facility];  Service: Orthopedics;  Laterality: Left;   EXTERNAL EAR SURGERY     multiple ear surguries as a child   OTHER SURGICAL HISTORY     benign head tumor#5   OTHER SURGICAL HISTORY     sebaceous cysts-post neck x5   SHOULDER ARTHROSCOPY W/ ACROMIAL REPAIR  08/2004   left shoulder   TONSILLECTOMY     ULNAR NERVE TRANSPOSITION Right 05/13/2013   Procedure: RIGHT ULNAR NERVE DECOMPRESSION/TRANSPOSITION;  Surgeon: Wyn Forster., MD;  Location: [facility];  Service: Orthopedics;  Laterality: Right;   HPI:  [name] is a 77 year old male who presents emergency department for chief concerns of generalized weakness. Pt with history of prior CVA with baseline nonverbal status, insulin-dependent diabetes mellitus, hypertension, hyperlipidemia. CXR 02/12/23, "No active cardiopulmonary disease."   Assessment / Plan / Recommendation  Clinical Impression  Pt seen for clinical  swallowing evaluation. Pt engages verbally, but aphasia evident. Pt consuming breakfast upon SLP entrance to room. Pt fed self with set up assistance due to tremulous UE. Pt demonstrated a functional oral swallow despite missing dentition. Pharyngeal swallow appeared [facility] per clinical assessment. Recommend continuation of a regular diet with thin liquids with safe swallowing strategies/aspiration precautions as outlined below. SLP to sign off as pt has no acute SLP needs at this time. Pt and RN made aware of results, recommendations, and SLP POC.      Aspiration Risk  Mild aspiration risk    Diet Recommendation Regular;Thin liquid    Medication Administration:  (as tolerated) Compensations: Slow rate;Small sips/bites Postural Changes: Seated upright at 90 degrees;Remain upright for at least 30 minutes after po intake    Other  Recommendations Oral Care Recommendations: Oral care QID;Staff/trained caregiver to provide oral care    Recommendations for follow up therapy are one component of a multi-disciplinary discharge planning process, led by the attending physician.  Recommendations may be updated based on patient status, additional functional criteria and insurance authorization.  Follow up Recommendations No SLP follow up         Functional Status Assessment Patient has not had a recent decline in their functional status         Prognosis Prognosis for improved oropharyngeal function: Good      Swallow Study   General Date of Onset: 02/13/23 HPI: [name] is a 77 year old male who presents emergency department for chief concerns of generalized weakness. Pt with history of prior CVA with baseline nonverbal status, insulin-dependent diabetes mellitus, hypertension, hyperlipidemia. Type of Study: Bedside Swallow Evaluation Previous Swallow Assessment: clinical swallowing evaluation in 2021 recommended regular diet with thin liquids Diet Prior to this Study: Regular Temperature Spikes Noted: No Respiratory Status: Room air History of Recent Intubation: No Behavior/Cognition: Alert;Cooperative;Requires cueing Oral Cavity Assessment: Within Functional Limits Oral Care Completed by SLP: Recent completion by staff Oral Cavity - Dentition: Missing dentition (edentulous upper arch) Vision: Functional for self-feeding Self-Feeding Abilities: Able to feed self;Needs set up (cutting solids) Baseline Vocal Quality: Normal Volitional Cough: Strong Volitional Swallow: Able to elicit    Oral/Motor/Sensory Function Overall Oral Motor/Sensory Function: Within functional limits   Ice Chips Ice chips: Not  tested   Thin Liquid Thin Liquid: Within functional limits Presentation: Cup;Straw;Self Fed    Nectar Thick Nectar Thick Liquid: Not tested   Honey Thick Honey Thick Liquid: Not tested   Puree Puree: Within functional limits Presentation: Self Fed   Solid     Solid: Within functional limits Presentation: Self Fed     Clyde Canterbury, M.S., CCC-SLP Speech-Language Pathologist [facility] [phone] (ASCOM)  Alessandra Bevels Earnestine Tuohey 02/13/2023,9:25 AM

## 2023-02-13 NOTE — [place] FL2 (Signed)
[place] MEDICAID [id number] LEVEL OF CARE FORM     IDENTIFICATION  Patient Name: Walter Harris Birthdate: July 13, 1946 Sex: male Admission Date (Current Location): 02/12/2023  [facility] and [place] Number:  Chiropodist and Address:  [facility], [address] [place], [place] [address]      Provider Number: [id number]  Attending Physician Name and Address:  Gillis Santa, MD  Relative Name and Phone Number:  [name]  [phone]    Current Level of Care: Hospital Recommended Level of Care: Skilled Nursing Facility Prior Approval Number:    Date Approved/Denied:   PASRR Number: [id number]  Discharge Plan: SNF    Current Diagnoses: Patient Active Problem List   Diagnosis Date Noted   Severe sepsis (HCC) 02/12/2023   Pressure injury of skin 02/12/2023   Hyperkalemia 02/12/2023   Nonverbal 02/12/2023   Altered mental status 02/12/2023   Weakness generalized 02/12/2023   Insomnia 01/09/2022   Decreased dorsalis pedis pulse 01/09/2022   Dupuytren contracture 02/21/2021   Lower extremity edema 02/21/2021   Left shoulder pain 11/11/2020   Statin myopathy 09/10/2020   Fall at home 08/09/2020   History of CVA in adulthood 12/07/2019   Gait abnormality 11/12/2019   Tremor, essential 11/12/2019   Health care maintenance 10/04/2018   Thumb pain 10/04/2018   Memory change 12/17/2017   Paresthesia 12/03/2017   Lightheaded 08/28/2017   Encounter for chronic pain management 06/18/2017   Neck pain 05/14/2017   Panic 01/10/2017   Muscle ache 10/05/2015   Syncope 07/08/2015   Chronic back pain 04/12/2015   Medicare annual wellness visit, initial 03/08/2015   Advance care planning 03/08/2015   Dysgeusia 11/28/2014   Anxiety state 10/27/2014   Diabetic polyneuropathy (HCC) 02/14/2014   Rhinitis, chronic 01/13/2012   Basal cell carcinoma    LUMBAR RADICULOPATHY 10/16/2010   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/07/2010   HLD  (hyperlipidemia) 05/13/2008   Uncontrolled type 2 diabetes mellitus with hyperglycemia (HCC) 05/26/2007   Essential hypertension 05/26/2007   SCOLIOSIS 05/26/2007    Orientation RESPIRATION BLADDER Height & Weight     Self, Time, Situation, Place  Normal Incontinent, External catheter Weight: 167 lb 1.7 oz (75.8 kg) Height:  5\' 8"  (172.7 cm)  BEHAVIORAL SYMPTOMS/MOOD NEUROLOGICAL BOWEL NUTRITION STATUS      Continent Diet (see discharge summary)  AMBULATORY STATUS COMMUNICATION OF NEEDS Skin   Extensive Assist Verbally Other (Comment) (ischeial tuberosity stage 1)                       Personal Care Assistance Level of Assistance  Bathing, Feeding, Dressing, Total care Bathing Assistance: Maximum assistance Feeding assistance: Limited assistance Dressing Assistance: Maximum assistance Total Care Assistance: Maximum assistance   Functional Limitations Info  Sight, Hearing, Speech Sight Info: Adequate Hearing Info: Adequate Speech Info: Impaired (nonverbal, rarely talkes, hx of stroke)    SPECIAL CARE FACTORS FREQUENCY  PT (By licensed PT), OT (By licensed OT)     PT Frequency: min 4x weekly OT Frequency: min 4x weekly            Contractures Contractures Info: Not present    Additional Factors Info  Code Status, Allergies Code Status Info: full Allergies Info: Bee Venom  Pravastatin  Ace Inhibitors  Actos (Pioglitazone)  Angiotensin Receptor Blockers  Aripiprazole  Crestor (Rosuvastatin Calcium)  Invokana (Canagliflozin)  Metformin And Related  Nsaids  Olanzapine  Pneumovax 23 (Pneumococcal Vac Polyvalent)  Codeine  Divalproex  Sodium  Lithium Carbonate  Sulfamethoxazole-trimethoprim           Current Medications (02/13/2023):  This is the current hospital active medication list Current Facility-Administered Medications  Medication Dose Route Frequency Provider Last Rate Last Admin   acetaminophen (TYLENOL) tablet 650 mg  650 mg Oral Q6H PRN Cox, Amy N, DO        Or   acetaminophen (TYLENOL) suppository 650 mg  650 mg Rectal Q6H PRN Cox, Amy N, DO       ALPRAZolam (XANAX) tablet 0.5 mg  0.5 mg Oral Daily PRN Cox, Amy N, DO       aspirin EC tablet 81 mg  81 mg Oral Daily Cox, Amy N, DO   81 mg at 02/13/23 1031   ceFEPIme (MAXIPIME) 2 g in sodium chloride 0.9 % 100 mL IVPB  2 g Intravenous Q12H Cox, Amy N, DO   Stopped at 02/13/23 1419   cyanocobalamin (VITAMIN B12) injection 1,000 mcg  1,000 mcg Intramuscular Daily Gillis Santa, MD   1,000 mcg at 02/13/23 1031   Followed by   Melene Muller ON 02/20/2023] cyanocobalamin (VITAMIN B12) tablet 1,000 mcg  1,000 mcg Oral Daily Gillis Santa, MD       enoxaparin (LOVENOX) injection 40 mg  40 mg Subcutaneous Q24H Cox, Amy N, DO   40 mg at 02/12/23 2241   FLUoxetine (PROZAC) capsule 30 mg  30 mg Oral Q lunch Cox, Amy N, DO   30 mg at 02/13/23 1210   hydrALAZINE (APRESOLINE) injection 5 mg  5 mg Intravenous Q8H PRN Cox, Amy N, DO       insulin aspart (novoLOG) injection 0-5 Units  0-5 Units Subcutaneous QHS Cox, Amy N, DO       insulin aspart (novoLOG) injection 0-9 Units  0-9 Units Subcutaneous TID WC Cox, Amy N, DO   1 Units at 02/13/23 1211   insulin glargine-yfgn (SEMGLEE) injection 20 Units  20 Units Subcutaneous QHS Cox, Amy N, DO   20 Units at 02/12/23 2241   lactated ringers infusion   Intravenous Continuous Gillis Santa, MD 100 mL/hr at 02/13/23 0804 New Bag at 02/13/23 0804   metroNIDAZOLE (FLAGYL) IVPB 500 mg  500 mg Intravenous Q12H Cox, Amy N, DO   Stopped at 02/13/23 1201   ondansetron (ZOFRAN) tablet 4 mg  4 mg Oral Q6H PRN Cox, Amy N, DO       Or   ondansetron (ZOFRAN) injection 4 mg  4 mg Intravenous Q6H PRN Cox, Amy N, DO       oxyCODONE-acetaminophen (PERCOCET/ROXICET) 5-325 MG per tablet 1 tablet  1 tablet Oral TID PRN Cox, Amy N, DO       senna-docusate (Senokot-S) tablet 1 tablet  1 tablet Oral QHS PRN Cox, Amy N, DO       sodium bicarbonate tablet 650 mg  650 mg Oral TID Gillis Santa, MD   650  mg at 02/13/23 1030   tiZANidine (ZANAFLEX) tablet 4 mg  4 mg Oral TID PRN Cox, Amy N, DO       traZODone (DESYREL) tablet 50 mg  50 mg Oral QHS Cox, Amy N, DO       vancomycin (VANCOREADY) IVPB 1250 mg/250 mL  1,250 mg Intravenous Q24H Sharen Hones, RPH 166.7 mL/hr at 02/13/23 1523 1,250 mg at 02/13/23 1523   Current Outpatient Medications  Medication Sig Dispense Refill   ALPRAZolam (XANAX) 0.5 MG tablet TAKE ONE TABLET BY MOUTH TWICE DAILY WITH AN EXTRA  1/2 TABLET IF NEEDED. MAY CAUSE DROWSINESS. 75 tablet 2   aspirin 81 MG EC tablet Take 1 tablet (81 mg total) by mouth daily. For stroke prevention (Patient taking differently: Take 81 mg by mouth daily with lunch. For stroke prevention)     FLUoxetine (PROZAC) 10 MG capsule Take 3 capsules (30 mg total) by mouth daily with lunch. TAKE 3 CAPSULES BY MOUTH DAILY FOR MOOD AS DIRECTED     glipiZIDE (GLUCOTROL XL) 10 MG 24 hr tablet Take 1 tablet (10 mg total) by mouth daily with breakfast. For diabetes     insulin glargine (LANTUS SOLOSTAR) 100 UNIT/ML Solostar Pen Inject 20 Units into the skin at bedtime. For diabetes     irbesartan (AVAPRO) 150 MG tablet Take 150 mg by mouth daily.     metFORMIN (GLUCOPHAGE-XR) 500 MG 24 hr tablet TAKE ONE TABLET 3 TIMES DAILY for diabetes (Patient taking differently: Take 500 mg by mouth 3 (three) times daily with meals. TAKE ONE TABLET 3 TIMES DAILY for diabetes)     oxyCODONE-acetaminophen (PERCOCET/ROXICET) 5-325 MG tablet TAKE ONE TO TWO TABLETS BY MOUTH 3 TIMESDAILY AS NEEDED FOR PAIN.  Fill on/after 30 days from rx 180 tablet 0   primidone (MYSOLINE) 50 MG tablet TAKE 1 TABLET BY MOUTH DAILY 30 tablet 1   propranolol ER (INDERAL LA) 60 MG 24 hr capsule TAKE 1 CAPSULE BY MOUTH ONCE DAILY 90 capsule 1   tiZANidine (ZANAFLEX) 4 MG tablet TAKE ONE TABLET 3 TIMES DAILY AS NEEDED FOR MUSCLE SPASM MAY TAKE 1 EXTRADOSE DAILY AS NEEDED 120 tablet 2   traZODone (DESYREL) 50 MG tablet TAKE 1 AND 1/2 TABLETS BY  MOUTH AT BEDTIME AS NEEDED FOR SLEEP 135 tablet 0   BD PEN NEEDLE NANO U/F 32G X 4 MM MISC USE AS DIRECTED 100 each 3   clopidogrel (PLAVIX) 75 MG tablet TAKE 1 TABLET BY MOUTH DAILY 90 tablet 3   oxyCODONE-acetaminophen (PERCOCET/ROXICET) 5-325 MG tablet TAKE 1-2 TABLETS BY MOUTH 3 TIMES DAILY AS NEEDED FOR PAIN. Fill on/after 60 days from rx 180 tablet 0   oxyCODONE-acetaminophen (PERCOCET/ROXICET) 5-325 MG tablet TAKE ONE TO TWO TABLETS BY MOUTH 3 TIMES DAILY IF NEEDED FOR PAIN. 180 tablet 0     Discharge Medications: Please see discharge summary for a list of discharge medications.  Relevant Imaging Results:  Relevant Lab Results:   Additional Information SSN: [id number]  Darolyn Rua, LCSW

## 2023-02-13 NOTE — Evaluation (Signed)
Physical Therapy Evaluation Patient Details Name: Walter Harris MRN: [id number] DOB: 11-02-1945 Today's Date: 02/13/2023  History of Present Illness  77 y/o male presented to ED on 02/12/23 for generalized weakness and code sepsis. PMH: CVA with baseline, DM, HTN  Clinical Impression  Patient admitted with the above. Patient is poor historian and per chart is nonverbal. Per chart, patient requires assistance for w/c transfers. Able to answer questions with increased time to respond with one word answers. Patient presents with weakness, impaired balance, and decreased activity tolerance. Required modA for bed mobility and minA+2 for sit to stand with HHAx2. Able to take small steps at bedside with minA+2 and HHAx2. Patient will benefit from skilled PT services during acute stay to address listed deficits. Patient will benefit from ongoing therapy at discharge to maximize functional independence and safety.        Recommendations for follow up therapy are one component of a multi-disciplinary discharge planning process, led by the attending physician.  Recommendations may be updated based on patient status, additional functional criteria and insurance authorization.  Follow Up Recommendations Can patient physically be transported by private vehicle: No     Assistance Recommended at Discharge Frequent or constant Supervision/Assistance  Patient can return home with the following  A lot of help with walking and/or transfers;A lot of help with bathing/dressing/bathroom;Assistance with cooking/housework;Direct supervision/assist for medications management;Direct supervision/assist for financial management;Assist for transportation;Help with stairs or ramp for entrance    Equipment Recommendations Other (comment) (TBD at next venue of care)  Recommendations for Other Services       Functional Status Assessment Patient has had a recent decline in their functional status and demonstrates the ability to  make significant improvements in function in a reasonable and predictable amount of time.     Precautions / Restrictions Precautions Precautions: Fall Restrictions Weight Bearing Restrictions: No      Mobility  Bed Mobility Overal bed mobility: Needs Assistance Bed Mobility: Supine to Sit, Sit to Supine     Supine to sit: Mod assist Sit to supine: Mod assist        Transfers Overall transfer level: Needs assistance Equipment used: 1 person hand held assist Transfers: Sit to/from Stand Sit to Stand: Min assist           General transfer comment: x2 at EOB, increased time    Ambulation/Gait Ambulation/Gait assistance: Min assist Gait Distance (Feet): 3 Feet Assistive device: 2 person hand held assist Gait Pattern/deviations: Step-to pattern Gait velocity: decreased     General Gait Details: side steps at EOB with minA  Stairs            Wheelchair Mobility    Modified Rankin (Stroke Patients Only)       Balance Overall balance assessment: Needs assistance Sitting-balance support: Bilateral upper extremity supported Sitting balance-Leahy Scale: Fair     Standing balance support: Bilateral upper extremity supported Standing balance-Leahy Scale: Fair                               Pertinent Vitals/Pain Pain Assessment Pain Assessment: Faces Faces Pain Scale: Hurts whole lot Pain Location: headache Pain Descriptors / Indicators: Constant, Discomfort, Grimacing Pain Intervention(s): Limited activity within patient's tolerance, Monitored during session, Repositioned    Home Living Family/patient expects to be discharged to:: Private residence Living Arrangements: Spouse/significant other               Home Equipment:  Wheelchair - manual Additional Comments: delayed responses, responds to yes/no questions.    Prior Function Prior Level of Function : Needs assist             Mobility Comments: per chart pt largely in  w/c or bed t/o day ADLs Comments: pt reports no assist for briefs mgmt     Hand Dominance        Extremity/Trunk Assessment   Upper Extremity Assessment Upper Extremity Assessment: Generalized weakness    Lower Extremity Assessment Lower Extremity Assessment: Generalized weakness    Cervical / Trunk Assessment Cervical / Trunk Assessment: Kyphotic  Communication   Communication: HOH  Cognition Arousal/Alertness: Awake/alert Behavior During Therapy: WFL for tasks assessed/performed Overall Cognitive Status: No family/caregiver present to determine baseline cognitive functioning                                 General Comments: states name, unclear HOH vs cognition deficits. Follows commands with time/repetition. Per chart, patient non verbal but requires increased time to respond to questions        General Comments      Exercises     Assessment/Plan    PT Assessment Patient needs continued PT services  PT Problem List Decreased strength;Decreased activity tolerance;Decreased balance;Decreased mobility;Decreased cognition;Decreased knowledge of use of DME;Decreased safety awareness;Cardiopulmonary status limiting activity;Decreased knowledge of precautions       PT Treatment Interventions DME instruction;Functional mobility training;Therapeutic activities;Therapeutic exercise;Balance training;Neuromuscular re-education;Wheelchair mobility training;Patient/family education    PT Goals (Current goals can be found in the Care Plan section)  Acute Rehab PT Goals Patient Stated Goal: did not state PT Goal Formulation: Patient unable to participate in goal setting Time For Goal Achievement: 02/27/23 Potential to Achieve Goals: Fair    Frequency Min 2X/week     Co-evaluation PT/OT/SLP Co-Evaluation/Treatment: Yes Reason for Co-Treatment: For patient/therapist safety;To address functional/ADL transfers PT goals addressed during session: Mobility/safety  with mobility OT goals addressed during session: ADL's and self-care       AM-PAC PT "6 Clicks" Mobility  Outcome Measure Help needed turning from your back to your side while in a flat bed without using bedrails?: A Lot Help needed moving from lying on your back to sitting on the side of a flat bed without using bedrails?: A Lot Help needed moving to and from a bed to a chair (including a wheelchair)?: A Lot Help needed standing up from a chair using your arms (e.g., wheelchair or bedside chair)?: A Little Help needed to walk in hospital room?: Total Help needed climbing 3-5 steps with a railing? : Total 6 Click Score: 11    End of Session   Activity Tolerance: Patient tolerated treatment well Patient left: in bed;with call bell/phone within reach;with bed alarm set Nurse Communication: Mobility status PT Visit Diagnosis: Unsteadiness on feet (R26.81);Muscle weakness (generalized) (M62.81);Other abnormalities of gait and mobility (R26.89)    Time: 0902-0919 PT Time Calculation (min) (ACUTE ONLY): 17 min   Charges:   PT Evaluation $PT Eval Moderate Complexity: 1 Mod          Maylon Peppers, PT, DPT Physical Therapist - [place]  [facility]   Walter Harris 02/13/2023, 12:20 PM

## 2023-02-13 NOTE — Evaluation (Signed)
Occupational Therapy Evaluation Patient Details Name: Walter Harris MRN: [id number] DOB: 1946/06/18 Today's Date: 02/13/2023   History of Present Illness 77 y/o male presented to ED on 02/12/23 for generalized weakness and code sepsis. PMH: CVA with baseline, DM, HTN   Clinical Impression   Mr Niziolek was seen for OT/PT co-evaluation this date. Pt is poor historian, per chart pt requires assist for w/c t.fs and ADLs. Answers to name; increased time to respond to questions. Pt currently requires MOD A bed mobility. MIN A + HHA sit<>stand x2 and side steps along EOB. MAX A don B socks seated EOB, minor posterior lean with dynamic tasks. Pt would benefit from skilled OT to address noted impairments and functional limitations (see below for any additional details). Upon hospital discharge, recommend follow up therapy.   Recommendations for follow up therapy are one component of a multi-disciplinary discharge planning process, led by the attending physician.  Recommendations may be updated based on patient status, additional functional criteria and insurance authorization.   Assistance Recommended at Discharge Frequent or constant Supervision/Assistance  Patient can return home with the following A lot of help with walking and/or transfers;A lot of help with bathing/dressing/bathroom;Help with stairs or ramp for entrance    Functional Status Assessment  Patient has had a recent decline in their functional status and demonstrates the ability to make significant improvements in function in a reasonable and predictable amount of time.  Equipment Recommendations  BSC/3in1;Hospital bed    Recommendations for Other Services       Precautions / Restrictions Precautions Precautions: Fall Restrictions Weight Bearing Restrictions: No      Mobility Bed Mobility Overal bed mobility: Needs Assistance Bed Mobility: Supine to Sit, Sit to Supine     Supine to sit: Mod assist Sit to supine: Mod assist         Transfers Overall transfer level: Needs assistance Equipment used: 1 person hand held assist Transfers: Sit to/from Stand Sit to Stand: Min assist           General transfer comment: x2 at EOB, increased time      Balance Overall balance assessment: Needs assistance Sitting-balance support: Bilateral upper extremity supported Sitting balance-Leahy Scale: Fair     Standing balance support: Bilateral upper extremity supported Standing balance-Leahy Scale: Fair                             ADL either performed or assessed with clinical judgement   ADL Overall ADL's : Needs assistance/impaired                                       General ADL Comments: MIN A + HHA for simulated BSC t/f. MAX A don B socks seated EOB, minor posterior lean with dynamic tasks.      Pertinent Vitals/Pain Pain Assessment Pain Assessment: Faces Faces Pain Scale: Hurts whole lot Pain Location: headache Pain Descriptors / Indicators: Constant, Discomfort, Grimacing Pain Intervention(s): Limited activity within patient's tolerance, Repositioned     Hand Dominance     Extremity/Trunk Assessment Upper Extremity Assessment Upper Extremity Assessment: Generalized weakness   Lower Extremity Assessment Lower Extremity Assessment: Generalized weakness       Communication Communication Communication: HOH   Cognition Arousal/Alertness: Awake/alert Behavior During Therapy: WFL for tasks assessed/performed Overall Cognitive Status: No family/caregiver present to determine baseline cognitive functioning  General Comments: states name, unclear HOH vs cognition deficits. Follows commands with time/repetition      Home Living Family/patient expects to be discharged to:: Private residence Living Arrangements: Spouse/significant other                           Home Equipment: Wheelchair - manual    Additional Comments: delayed responses, responds to yes/no questions.      Prior Functioning/Environment Prior Level of Function : Needs assist             Mobility Comments: per chart pt largely in w/c or bed t/o day ADLs Comments: pt reports no assist for briefs mgmt        OT Problem List: Decreased strength;Decreased range of motion;Decreased activity tolerance;Impaired balance (sitting and/or standing);Decreased safety awareness      OT Treatment/Interventions: Self-care/ADL training;Therapeutic exercise;Energy conservation;DME and/or AE instruction;Therapeutic activities;Patient/family education;Balance training    OT Goals(Current goals can be found in the care plan section) Acute Rehab OT Goals Patient Stated Goal: to improve pain OT Goal Formulation: With patient Time For Goal Achievement: 02/27/23 Potential to Achieve Goals: Fair ADL Goals Pt Will Perform Grooming: with set-up;with supervision;sitting Pt Will Perform Lower Body Dressing: sit to/from stand;with min assist;with caregiver independent in assisting Pt Will Transfer to Toilet: with min guard assist;stand pivot transfer;bedside commode  OT Frequency: Min 2X/week    Co-evaluation PT/OT/SLP Co-Evaluation/Treatment: Yes Reason for Co-Treatment: For patient/therapist safety;To address functional/ADL transfers PT goals addressed during session: Mobility/safety with mobility OT goals addressed during session: ADL's and self-care      AM-PAC OT "6 Clicks" Daily Activity     Outcome Measure Help from another person eating meals?: None Help from another person taking care of personal grooming?: A Little Help from another person toileting, which includes using toliet, bedpan, or urinal?: A Little Help from another person bathing (including washing, rinsing, drying)?: A Lot Help from another person to put on and taking off regular upper body clothing?: A Little Help from another person to put on and taking  off regular lower body clothing?: A Lot 6 Click Score: 17   End of Session Nurse Communication: Patient requests pain meds  Activity Tolerance: Patient tolerated treatment well Patient left: in bed;with call bell/phone within reach;with bed alarm set  OT Visit Diagnosis: Unsteadiness on feet (R26.81);Muscle weakness (generalized) (M62.81)                Time: 1610-9604 OT Time Calculation (min): 17 min Charges:  OT General Charges $OT Visit: 1 Visit OT Evaluation $OT Eval Moderate Complexity: 1 Mod  Kathie Dike, M.S. OTR/L  02/13/23, 10:33 AM  ascom [phone]

## 2023-02-13 NOTE — ED Notes (Signed)
Floor notified of pt departure from ED 

## 2023-02-13 NOTE — Progress Notes (Signed)
[facility] Progress Note  Patient: Walter Harris    ZOX:[id number]  DOA: 02/12/2023     Date of Service: the patient was seen and examined on 02/13/2023  Chief Complaint  Patient presents with   Code Sepsis   Brief hospital course: [name] is a 77 year old male with history of prior CVA with baseline nonverbal status, insulin-dependent diabetes mellitus, hypertension, hyperlipidemia, who presents emergency department for chief concerns of generalized weakness.   Vitals in the ED showed temperature of 98.2, respiration rate of 21, heart rate of 105, blood pressure 164/95, SpO2 95% on room air.   Serum sodium is 137, potassium 5.4, chloride 109, bicarb 17, BUN 63, serum creatinine is 1.79, EGFR 39, nonfasting blood glucose 278, WBC 12.7, hemoglobin 12.8, platelets of 298.   Lactic acid 2.2. UA was negative for leukocytes and nitrites. Blood cultures x 2 have been collected and are in process.   ED treatment: Sodium chloride 1 L bolus  TRH consulted for admission and further management as below.  Assessment and Plan: Principal Problem:   Severe sepsis (HCC) Active Problems:   HLD (hyperlipidemia)   Essential hypertension   Insulin dependent type 2 diabetes mellitus (HCC)   Diabetic polyneuropathy (HCC)   Anxiety state   Tremor, essential   History of CVA in adulthood   Dupuytren contracture   Insomnia   Pressure injury of skin   Hyperkalemia   Nonverbal   Altered mental status   Weakness generalized  Severe sepsis (HCC) Patient had a low bated heart rate, respiration rate, leukocytosis of 12.7, elevated lactic acid of 2.2, organ involvement is renal Blood cultures x 2 are in process Procalcitonin 0.1 low Vancomycin, cefepime per pharmacy ordered; metronidazole 500 mg IV twice daily ordered S/p NS bolus  Continue Tele     Altered mental status Etiology workup in progress, differentials include severe sepsis with dehydration complicated by nonverbal  baseline state in setting of history of CVA Blood cultures in process Treatment per above   Nonverbal At baseline, secondary to history of CVA   Metabolic acidosis, CO2 17 Started bicarbonate oral  AKI most likely due to dehydration Renal functions and urine output daily Continue IV fluid for hydration  Hyperkalemia, Resolved  Presumed secondary to acute kidney injury Status post sepsis bolus ordered Post sepsis ordered sodium chloride infusion at 125 mL/h, 1 day ordered Repeat BMP in the a.m.   Elevated troponin, trending down, most likely demand ischemia Continue tele monitor  Pressure injury of skin Present on admission Wound care consulted   History of CVA in adulthood Aspirin 81 mg daily resumed   Insulin dependent type 2 diabetes mellitus (HCC) Insulin SSI with at bedtime coverage Goal inpatient blood glucose levels 140-180   Essential hypertension Hydralazine 5 mg IV every 8 hours as needed for SBP greater than 175, 5 days ordered   # Generalized weakness could be secondary to vitamin B12, folate and vitamin D deficiency. # Vitamin D deficiency: started vitamin D 50,000 units p.o. weekly, follow with PCP to repeat vitamin D level after 3 to 6 months. # Folic acid deficiency, folate level 3.5, started folic acid 1 mg p.o. daily # Vitamin B12 deficiency: B12 level 147, Goal >400, Started vitamin B12 1000 mcg IM injection daily during hospital stay, followed by oral supplement.  Follow-up PCP to repeat vitamin B12 level after 3 to 6 months.   Body mass index is 25.41 kg/m.  Interventions:  Pressure Injury 02/12/23 Ischial tuberosity Left Stage  1 -  Intact skin with non-blanchable redness of a localized area usually over a bony prominence. (Active)  02/12/23 1132  Location: Ischial tuberosity  Location Orientation: Left  Staging: Stage 1 -  Intact skin with non-blanchable redness of a localized area usually over a bony prominence.  Wound Description (Comments):    Present on Admission:      Pressure Injury 02/12/23 Ischial tuberosity Right Stage 1 -  Intact skin with non-blanchable redness of a localized area usually over a bony prominence. (Active)  02/12/23 1132  Location: Ischial tuberosity  Location Orientation: Right  Staging: Stage 1 -  Intact skin with non-blanchable redness of a localized area usually over a bony prominence.  Wound Description (Comments):   Present on Admission:      Diet: Carb modified diet DVT Prophylaxis: Subcutaneous Lovenox   Advance goals of care discussion: DNR  Family Communication: family was present at bedside, at the time of interview.  The pt provided permission to discuss medical plan with the family. Opportunity was given to ask question and all questions were answered satisfactorily.   Disposition:  Pt is from Home, admitted with sepsis, weakness, still has weakness, which precludes a safe discharge. Discharge to SNF, when stable.  Subjective: No significant events overnight, patient was admitted due to generalized weakness and AMS.  Patient's wife was at bedside, who mentioned that patient has been weak and bedbound for past 3 days.  She has been calling EMS every day, there was no improvement so she brought him to the ED.  Patient is nonverbal at baseline, unable to offer any complaints.   Physical Exam: General: NAD, lying comfortably Appear in no distress, affect appropriate Eyes: PERRLA ENT: Oral Mucosa Clear, moist  Neck: no JVD,  Cardiovascular: S1 and S2 Present, no Murmur,  Respiratory: good respiratory effort, Bilateral Air entry equal and Decreased, no Crackles, no wheezes Abdomen: Bowel Sound present, Soft and no tenderness,  Skin: no rashes Extremities: no Pedal edema, no calf tenderness Neurologic: without any new focal findings Gait not checked due to patient safety concerns  Vitals:   02/13/23 1500 02/13/23 1530 02/13/23 1615 02/13/23 1720  BP: 111/88   (!) 138/96  Pulse:  84  80 79  Resp: (!) 22 (!) 22 20 20   Temp:      TempSrc:      SpO2:  100% 100% 99%  Weight:      Height:        Intake/Output Summary (Last 24 hours) at 02/13/2023 1735 Last data filed at 02/13/2023 1419 Gross per 24 hour  Intake 200 ml  Output --  Net 200 ml   Filed Weights   02/12/23 1225  Weight: 75.8 kg    Data Reviewed: I have personally reviewed and interpreted daily labs, tele strips, imagings as discussed above. I reviewed all nursing notes, pharmacy notes, vitals, pertinent old records I have discussed plan of care as described above with RN and patient/family.  CBC: Recent Labs  Lab 02/12/23 1109 02/13/23 0510  WBC 12.7* 9.7  NEUTROABS 11.1*  --   HGB 12.8* 11.0*  HCT 37.6* 33.4*  MCV 90.4 93.6  PLT 298 223   Basic Metabolic Panel: Recent Labs  Lab 02/12/23 1109 02/13/23 0510  NA 137 140  K 5.4* 4.4  CL 109 115*  CO2 17* 17*  GLUCOSE 278* 144*  BUN 63* 46*  CREATININE 1.79* 1.33*  CALCIUM 8.4* 7.9*    Studies: No results found.  Scheduled Meds:  aspirin EC  81 mg Oral Daily   cyanocobalamin  1,000 mcg Intramuscular Daily   Followed by   Melene Muller ON 02/20/2023] vitamin B-12  1,000 mcg Oral Daily   enoxaparin (LOVENOX) injection  40 mg Subcutaneous Q24H   FLUoxetine  30 mg Oral Q lunch   insulin aspart  0-5 Units Subcutaneous QHS   insulin aspart  0-9 Units Subcutaneous TID WC   insulin glargine-yfgn  20 Units Subcutaneous QHS   sodium bicarbonate  650 mg Oral TID   traZODone  50 mg Oral QHS   Continuous Infusions:  ceFEPime (MAXIPIME) IV Stopped (02/13/23 1419)   lactated ringers Stopped (02/13/23 1655)   metronidazole Stopped (02/13/23 1201)   vancomycin Stopped (02/13/23 1655)   PRN Meds: acetaminophen **OR** acetaminophen, ALPRAZolam, hydrALAZINE, ondansetron **OR** ondansetron (ZOFRAN) IV, oxyCODONE-acetaminophen, senna-docusate, tiZANidine  Time spent: 55 minutes  Author: Gillis Santa. MD [facility] 02/13/2023 5:35 PM  To  reach On-call, see care teams to locate the attending and reach out to them via [email or web address]. If 7PM-7AM, please contact night-coverage If you still have difficulty reaching the attending provider, please page the [facility] (Director on Call) for [facility] on amion for assistance.

## 2023-02-14 DIAGNOSIS — R6520 Severe sepsis without septic shock: Secondary | ICD-10-CM | POA: Diagnosis not present

## 2023-02-14 DIAGNOSIS — A419 Sepsis, unspecified organism: Secondary | ICD-10-CM | POA: Diagnosis not present

## 2023-02-14 LAB — BASIC METABOLIC PANEL
Anion gap: 10 (ref 5–15)
BUN: 28 mg/dL — ABNORMAL HIGH (ref 8–23)
CO2: 22 mmol/L (ref 22–32)
Calcium: 8.3 mg/dL — ABNORMAL LOW (ref 8.9–10.3)
Chloride: 104 mmol/L (ref 98–111)
Creatinine, Ser: 0.96 mg/dL (ref 0.61–1.24)
GFR, Estimated: >60 mL/min (ref >60)
Glucose, Bld: 106 mg/dL — ABNORMAL HIGH (ref 70–99)
Potassium: 3.6 mmol/L (ref 3.5–5.1)
Sodium: 136 mmol/L (ref 135–145)

## 2023-02-14 LAB — GLUCOSE, CAPILLARY
Glucose-Capillary: 115 mg/dL — ABNORMAL HIGH (ref 70–99)
Glucose-Capillary: 142 mg/dL — ABNORMAL HIGH (ref 70–99)
Glucose-Capillary: 178 mg/dL — ABNORMAL HIGH (ref 70–99)
Glucose-Capillary: 163 mg/dL — ABNORMAL HIGH (ref 70–99)

## 2023-02-14 LAB — CBC
HCT: 35.9 % — ABNORMAL LOW (ref 39.0–52.0)
Hemoglobin: 12 g/dL — ABNORMAL LOW (ref 13.0–17.0)
MCH: 30.4 pg (ref 26.0–34.0)
MCHC: 33.4 g/dL (ref 30.0–36.0)
MCV: 90.9 fL (ref 80.0–100.0)
Platelets: 208 10*3/uL (ref 150–400)
RBC: 3.95 MIL/uL — ABNORMAL LOW (ref 4.22–5.81)
RDW: 12 % (ref 11.5–15.5)
WBC: 8.5 10*3/uL (ref 4.0–10.5)
nRBC: 0 % (ref 0.0–0.2)

## 2023-02-14 LAB — MAGNESIUM: Magnesium: 1.8 mg/dL (ref 1.7–2.4)

## 2023-02-14 LAB — CULTURE, BLOOD (ROUTINE X 2)

## 2023-02-14 LAB — HEMOGLOBIN A1C
Hgb A1c MFr Bld: 8.2 % — ABNORMAL HIGH (ref 4.8–5.6)
Mean Plasma Glucose: 189 mg/dL

## 2023-02-14 LAB — PHOSPHORUS: Phosphorus: 2.3 mg/dL — ABNORMAL LOW (ref 2.5–4.6)

## 2023-02-14 MED ORDER — PROPRANOLOL HCL ER 60 MG PO CP24
60.0000 mg | ORAL_CAPSULE | Freq: Every day | ORAL | Status: DC
  Administered 2023-02-14 – 2023-02-21 (×7): 60 mg via ORAL
  Filled 2023-02-14 (×11): qty 1

## 2023-02-14 MED ORDER — K PHOS MONO-SOD PHOS DI & MONO 155-852-130 MG PO TABS
500.0000 mg | ORAL_TABLET | Freq: Three times a day (TID) | ORAL | Status: AC
  Administered 2023-02-14 (×2): 500 mg via ORAL
  Filled 2023-02-14 (×2): qty 2

## 2023-02-14 MED ORDER — ENSURE ENLIVE PO LIQD
237.0000 mL | Freq: Two times a day (BID) | ORAL | Status: DC
  Administered 2023-02-14 – 2023-02-24 (×20): 237 mL via ORAL

## 2023-02-14 MED ORDER — LACTATED RINGERS IV SOLN: INTRAVENOUS | Status: AC

## 2023-02-14 MED ORDER — IRBESARTAN 150 MG PO TABS
150.0000 mg | ORAL_TABLET | Freq: Every evening | ORAL | Status: DC
  Administered 2023-02-14 – 2023-02-20 (×7): 150 mg via ORAL
  Filled 2023-02-14 (×7): qty 1

## 2023-02-14 NOTE — Care Management Important Message (Signed)
Important Message  Patient Details  Name: Walter Harris MRN: [id number] Date of Birth: 1946-01-13   Medicare Important Message Given:  Yes     Johnell Comings 02/14/2023, 1:39 PM

## 2023-02-14 NOTE — Progress Notes (Signed)
[facility] Progress Note  Patient: Walter Harris    YQM:[id number]  DOA: 02/12/2023     Date of Service: the patient was seen and examined on 02/14/2023  Chief Complaint  Patient presents with   Code Sepsis   Brief hospital course: [name] is a 77 year old male with history of prior CVA with baseline nonverbal status, insulin-dependent diabetes mellitus, hypertension, hyperlipidemia, who presents emergency department for chief concerns of generalized weakness.   Vitals in the ED showed temperature of 98.2, respiration rate of 21, heart rate of 105, blood pressure 164/95, SpO2 95% on room air.   Serum sodium is 137, potassium 5.4, chloride 109, bicarb 17, BUN 63, serum creatinine is 1.79, EGFR 39, nonfasting blood glucose 278, WBC 12.7, hemoglobin 12.8, platelets of 298.   Lactic acid 2.2. UA was negative for leukocytes and nitrites. Blood cultures x 2 have been collected and are in process.   ED treatment: Sodium chloride 1 L bolus  TRH consulted for admission and further management as below.  Assessment and Plan: Principal Problem:   Severe sepsis (HCC) Active Problems:   HLD (hyperlipidemia)   Essential hypertension   Insulin dependent type 2 diabetes mellitus (HCC)   Diabetic polyneuropathy (HCC)   Anxiety state   Tremor, essential   History of CVA in adulthood   Dupuytren contracture   Insomnia   Pressure injury of skin   Hyperkalemia   Nonverbal   Altered mental status   Weakness generalized  Severe sepsis (HCC) Patient had a low bated heart rate, respiration rate, leukocytosis of 12.7, elevated lactic acid of 2.2, organ involvement is renal Blood cultures x 2 negative Procalcitonin 0.1 low S/p Vancomycin, cefepime and metronidazole, S/p NS bolus  Continue Tele Vital signs stable, sepsis resolved.  No source of infection.  Discontinued antibiotics on 6/7 Monitor off antibiotics for now     Altered mental status Etiology workup in progress,  differentials include severe sepsis with dehydration complicated by nonverbal baseline state in setting of history of CVA Blood cultures in process Treatment per above   Nonverbal At baseline, secondary to history of CVA   Metabolic acidosis, CO2 17--22 improved S/p bicarbonate oral  AKI most likely due to dehydration, Resolved after IVF  Renal functions and urine output daily Continue IV fluid for hydration  Hyperkalemia, Resolved  Presumed secondary to acute kidney injury Status post sepsis bolus ordered Post sepsis ordered sodium chloride infusion at 125 mL/h, 1 day ordered Repeat BMP in the a.m.   Hypophosphatemia due to nutritional deficiency.  Phos repleted. Monitor electrolytes and replete as needed.  Elevated troponin, trending down, most likely demand ischemia Continue tele monitor  Pressure injury of skin Present on admission Wound care consulted   History of CVA in adulthood Aspirin 81 mg daily resumed   Insulin dependent type 2 diabetes mellitus (HCC) Insulin SSI with at bedtime coverage Goal inpatient blood glucose levels 140-180   Essential hypertension Resumed propranolol and irbesartan home dose Hydralazine 5 mg IV every 8 hours as needed for SBP greater than 175, 5 days ordered   # Generalized weakness could be secondary to vitamin B12, folate and vitamin D deficiency. # Vitamin D deficiency: started vitamin D 50,000 units p.o. weekly, follow with PCP to repeat vitamin D level after 3 to 6 months. # Folic acid deficiency, folate level 3.5, started folic acid 1 mg p.o. daily # Vitamin B12 deficiency: B12 level 147, Goal >400, Started vitamin B12 1000 mcg IM injection daily during  hospital stay, followed by oral supplement.  Follow-up PCP to repeat vitamin B12 level after 3 to 6 months.   Body mass index is 25.41 kg/m.  Interventions:  Pressure Injury 02/12/23 Ischial tuberosity Left Stage 1 -  Intact skin with non-blanchable redness of a localized  area usually over a bony prominence. (Active)  02/12/23 1132  Location: Ischial tuberosity  Location Orientation: Left  Staging: Stage 1 -  Intact skin with non-blanchable redness of a localized area usually over a bony prominence.  Wound Description (Comments):   Present on Admission:      Pressure Injury 02/12/23 Ischial tuberosity Right Stage 1 -  Intact skin with non-blanchable redness of a localized area usually over a bony prominence. (Active)  02/12/23 1132  Location: Ischial tuberosity  Location Orientation: Right  Staging: Stage 1 -  Intact skin with non-blanchable redness of a localized area usually over a bony prominence.  Wound Description (Comments):   Present on Admission:      Diet: Carb modified diet DVT Prophylaxis: Subcutaneous Lovenox   Advance goals of care discussion: DNR  Family Communication: family was present at bedside, at the time of interview.  The pt provided permission to discuss medical plan with the family. Opportunity was given to ask question and all questions were answered satisfactorily.   Disposition:  Pt is from Home, admitted with sepsis, weakness, still has weakness, which precludes a safe discharge. Discharge to SNF, when stable.  Subjective: No significant events overnight, patient is nonverbal at his baseline, very hard of hearing, resting comfortably, denied any complaints. Patient's wife was at bedside, she feels that he is improving, no any specific complaints.  Management plan discussed in details.   Physical Exam: General: NAD, lying comfortably Appear in no distress, affect appropriate Eyes: PERRLA ENT: Oral Mucosa Clear, moist  Neck: no JVD,  Cardiovascular: S1 and S2 Present, no Murmur,  Respiratory: good respiratory effort, Bilateral Air entry equal and Decreased, no Crackles, no wheezes Abdomen: Bowel Sound present, Soft and no tenderness,  Skin: no rashes Extremities: no Pedal edema, no calf tenderness Neurologic:  without any new focal findings Gait not checked due to patient safety concerns  Vitals:   02/13/23 2358 02/14/23 0332 02/14/23 0821 02/14/23 1423  BP: (!) 147/101 (!) 161/105 (!) 146/106 (!) 150/112  Pulse: 80 78 80 84  Resp: 18 16 18 18   Temp: 97.9 F (36.6 C) 98.2 F (36.8 C) (!) 95.8 F (35.4 C)   TempSrc:      SpO2: 95% 100% 99% (!) 88%  Weight:      Height:        Intake/Output Summary (Last 24 hours) at 02/14/2023 1518 Last data filed at 02/14/2023 1100 Gross per 24 hour  Intake 2323.92 ml  Output 2750 ml  Net -426.08 ml   Filed Weights   02/12/23 1225  Weight: 75.8 kg    Data Reviewed: I have personally reviewed and interpreted daily labs, tele strips, imagings as discussed above. I reviewed all nursing notes, pharmacy notes, vitals, pertinent old records I have discussed plan of care as described above with RN and patient/family.  CBC: Recent Labs  Lab 02/12/23 1109 02/13/23 0510 02/14/23 0449  WBC 12.7* 9.7 8.5  NEUTROABS 11.1*  --   --   HGB 12.8* 11.0* 12.0*  HCT 37.6* 33.4* 35.9*  MCV 90.4 93.6 90.9  PLT 298 223 208   Basic Metabolic Panel: Recent Labs  Lab 02/12/23 1109 02/13/23 0510 02/14/23 0449  NA 137  140 136  K 5.4* 4.4 3.6  CL 109 115* 104  CO2 17* 17* 22  GLUCOSE 278* 144* 106*  BUN 63* 46* 28*  CREATININE 1.79* 1.33* 0.96  CALCIUM 8.4* 7.9* 8.3*  MG  --   --  1.8  PHOS  --   --  2.3*    Studies: No results found.  Scheduled Meds:  aspirin EC  81 mg Oral Daily   cyanocobalamin  1,000 mcg Intramuscular Daily   Followed by   Melene Muller ON 02/20/2023] vitamin B-12  1,000 mcg Oral Daily   enoxaparin (LOVENOX) injection  40 mg Subcutaneous Q24H   feeding supplement  237 mL Oral BID BM   FLUoxetine  30 mg Oral Q lunch   folic acid  1 mg Oral Daily   insulin aspart  0-5 Units Subcutaneous QHS   insulin aspart  0-9 Units Subcutaneous TID WC   insulin glargine-yfgn  20 Units Subcutaneous QHS   irbesartan  150 mg Oral QPM   phosphorus   500 mg Oral TID   propranolol ER  60 mg Oral Daily   sodium bicarbonate  650 mg Oral TID   traZODone  50 mg Oral QHS   Vitamin D (Ergocalciferol)  50,000 Units Oral Q7 days   Continuous Infusions:  lactated ringers 75 mL/hr at 02/14/23 1114   PRN Meds: acetaminophen **OR** acetaminophen, ALPRAZolam, hydrALAZINE, ondansetron **OR** ondansetron (ZOFRAN) IV, oxyCODONE-acetaminophen, senna-docusate, tiZANidine  Time spent: 55 minutes  Author: Gillis Santa. MD [facility] 02/14/2023 3:18 PM  To reach On-call, see care teams to locate the attending and reach out to them via [email or web address]. If 7PM-7AM, please contact night-coverage If you still have difficulty reaching the attending provider, please page the [facility] (Director on Call) for [facility] on amion for assistance.

## 2023-02-14 NOTE — TOC Progression Note (Signed)
Transition of Care [facility]) - Progression Note    Patient Details  Name: Walter Harris MRN: [id number] Date of Birth: 08-03-1946  [facility]) CM/SW Contact  Truddie Hidden, RN Phone Number: 02/14/2023, 3:44 PM  Clinical Narrative:    Spoke with patient's wife to give bed offers for [facility] and [facility]. Patient's wife has chosen [facility]. She was emotionally upset and crying about patient possible being long term. RNCM provided emotional support.    Expected Discharge Plan: Skilled Nursing Facility Barriers to Discharge: Continued Medical Work up  Expected Discharge Plan and Services       Living arrangements for the past 2 months: Single Family Home                                       Social Determinants of Health (SDOH) Interventions SDOH Screenings   Food Insecurity: No Food Insecurity (11/01/2022)  Housing: High Risk (02/13/2023)  Transportation Needs: Patient Unable To Answer (02/13/2023)  Utilities: Patient Unable To Answer (02/13/2023)  Alcohol Screen: Low Risk  (10/31/2021)  Depression (PHQ2-9): Low Risk  (11/01/2022)  Financial Resource Strain: Low Risk  (11/01/2022)  Physical Activity: Inactive (11/01/2022)  Social Connections: Socially Isolated (11/01/2022)  Stress: No Stress Concern Present (11/01/2022)  Tobacco Use: Medium Risk (02/13/2023)    Readmission Risk Interventions     No data to display

## 2023-02-14 NOTE — Progress Notes (Signed)
Physical Therapy Treatment Patient Details Name: Walter Harris MRN: [id number] DOB: 22-Aug-1946 Today's Date: 02/14/2023   History of Present Illness 77 y/o male presented to ED on 02/12/23 for generalized weakness and code sepsis. PMH: CVA with baseline, DM, HTN    PT Comments    Pt sitting in recliner upon PT arrival (staff reporting using sit to stand lift to transfer pt bed to recliner).  Pt able to state his first name; pt occasionally talking (single words at a time) during session.  No pain-like behavior noted.  During session performed x5 sit to stands from recliner with min assist x1.  Pt appeared to fatigue with activity (generalized weakness noted).  Will continue to work on strengthening, endurance, balance, and progressive functional mobility during hospitalization.    Recommendations for follow up therapy are one component of a multi-disciplinary discharge planning process, led by the attending physician.  Recommendations may be updated based on patient status, additional functional criteria and insurance authorization.  Follow Up Recommendations  Can patient physically be transported by private vehicle: No    Assistance Recommended at Discharge Frequent or constant Supervision/Assistance  Patient can return home with the following A lot of help with walking and/or transfers;A lot of help with bathing/dressing/bathroom;Assistance with cooking/housework;Direct supervision/assist for medications management;Direct supervision/assist for financial management;Assist for transportation;Help with stairs or ramp for entrance   Equipment Recommendations  Other (comment) (TBD at next venue of care)    Recommendations for Other Services       Precautions / Restrictions Precautions Precautions: Fall Restrictions Weight Bearing Restrictions: No     Mobility  Bed Mobility               General bed mobility comments: Deferred (pt in recliner beginning/end of session)     Transfers Overall transfer level: Needs assistance Equipment used: 1 person hand held assist Transfers: Sit to/from Stand Sit to Stand: Min assist           General transfer comment: x5 trials (sit to/from stand) from recliner; vc's for UE placement and to lean forward prior to standing; knees blocked for safety    Ambulation/Gait                   Stairs             Wheelchair Mobility    Modified Rankin (Stroke Patients Only)       Balance Overall balance assessment: Needs assistance Sitting-balance support: Bilateral upper extremity supported, Feet supported Sitting balance-Leahy Scale: Fair Sitting balance - Comments: steady static sitting   Standing balance support: Bilateral upper extremity supported Standing balance-Leahy Scale: Poor Standing balance comment: min assist for static standing with B UE support                            Cognition Arousal/Alertness: Awake/alert Behavior During Therapy: WFL for tasks assessed/performed Overall Cognitive Status: No family/caregiver present to determine baseline cognitive functioning                                 General Comments: Pt able to state first name only.  Follows cues with increased time and repetition.  Pt occasionally talking (single words at a time).        Exercises      General Comments  Nursing cleared pt for participation in physical therapy.  Pt agreeable to PT  session.      Pertinent Vitals/Pain Pain Assessment Pain Assessment: Faces Faces Pain Scale: No hurt Pain Intervention(s): Limited activity within patient's tolerance, Monitored during session, Repositioned    Home Living                          Prior Function            PT Goals (current goals can now be found in the care plan section) Acute Rehab PT Goals Patient Stated Goal: did not state PT Goal Formulation: Patient unable to participate in goal setting Time For  Goal Achievement: 02/27/23 Potential to Achieve Goals: Fair Progress towards PT goals: Progressing toward goals    Frequency    Min 2X/week      PT Plan Current plan remains appropriate    Co-evaluation              AM-PAC PT "6 Clicks" Mobility   Outcome Measure  Help needed turning from your back to your side while in a flat bed without using bedrails?: A Lot Help needed moving from lying on your back to sitting on the side of a flat bed without using bedrails?: A Lot Help needed moving to and from a bed to a chair (including a wheelchair)?: A Lot Help needed standing up from a chair using your arms (e.g., wheelchair or bedside chair)?: A Little Help needed to walk in hospital room?: Total Help needed climbing 3-5 steps with a railing? : Total 6 Click Score: 11    End of Session Equipment Utilized During Treatment: Gait belt Activity Tolerance: Patient tolerated treatment well Patient left: in chair;with call bell/phone within reach;with chair alarm set;with nursing/sitter in room Nurse Communication: Mobility status;Precautions;Other (comment);Need for lift equipment (sit to stand lift) PT Visit Diagnosis: Unsteadiness on feet (R26.81);Muscle weakness (generalized) (M62.81);Other abnormalities of gait and mobility (R26.89)     Time: 1555-1620 PT Time Calculation (min) (ACUTE ONLY): 25 min  Charges:  $Therapeutic Activity: 23-37 mins                     Hendricks Limes, PT 02/14/23, 5:18 PM

## 2023-02-15 DIAGNOSIS — A419 Sepsis, unspecified organism: Secondary | ICD-10-CM | POA: Diagnosis not present

## 2023-02-15 DIAGNOSIS — R6520 Severe sepsis without septic shock: Secondary | ICD-10-CM | POA: Diagnosis not present

## 2023-02-15 LAB — CBC
HCT: 34.2 % — ABNORMAL LOW (ref 39.0–52.0)
Hemoglobin: 11.5 g/dL — ABNORMAL LOW (ref 13.0–17.0)
MCH: 30 pg (ref 26.0–34.0)
MCHC: 33.6 g/dL (ref 30.0–36.0)
MCV: 89.3 fL (ref 80.0–100.0)
Platelets: 184 10*3/uL (ref 150–400)
RBC: 3.83 MIL/uL — ABNORMAL LOW (ref 4.22–5.81)
RDW: 12 % (ref 11.5–15.5)
WBC: 8 10*3/uL (ref 4.0–10.5)
nRBC: 0 % (ref 0.0–0.2)

## 2023-02-15 LAB — BASIC METABOLIC PANEL
Anion gap: 8 (ref 5–15)
BUN: 27 mg/dL — ABNORMAL HIGH (ref 8–23)
CO2: 27 mmol/L (ref 22–32)
Calcium: 8.3 mg/dL — ABNORMAL LOW (ref 8.9–10.3)
Chloride: 100 mmol/L (ref 98–111)
Creatinine, Ser: 0.93 mg/dL (ref 0.61–1.24)
GFR, Estimated: >60 mL/min (ref >60)
Glucose, Bld: 89 mg/dL (ref 70–99)
Potassium: 3.4 mmol/L — ABNORMAL LOW (ref 3.5–5.1)
Sodium: 135 mmol/L (ref 135–145)

## 2023-02-15 LAB — GLUCOSE, CAPILLARY
Glucose-Capillary: 103 mg/dL — ABNORMAL HIGH (ref 70–99)
Glucose-Capillary: 68 mg/dL — ABNORMAL LOW (ref 70–99)
Glucose-Capillary: 259 mg/dL — ABNORMAL HIGH (ref 70–99)
Glucose-Capillary: 186 mg/dL — ABNORMAL HIGH (ref 70–99)
Glucose-Capillary: 116 mg/dL — ABNORMAL HIGH (ref 70–99)

## 2023-02-15 LAB — MAGNESIUM: Magnesium: 1.8 mg/dL (ref 1.7–2.4)

## 2023-02-15 LAB — PHOSPHORUS: Phosphorus: 2.7 mg/dL (ref 2.5–4.6)

## 2023-02-15 LAB — CULTURE, BLOOD (ROUTINE X 2)

## 2023-02-15 NOTE — Progress Notes (Signed)
Progress Note   Patient: Walter Harris:[id number] DOB: [birth year] DOA: 02/12/2023     3 DOS: the patient was seen and examined on 02/15/2023   Brief hospital course: [name] is a 77 year old male with history of prior CVA with baseline nonverbal status, insulin-dependent diabetes mellitus, hypertension, hyperlipidemia, who presents emergency department for chief concerns of generalized weakness.   Vitals in the ED showed temperature of 98.2, respiration rate of 21, heart rate of 105, blood pressure 164/95, SpO2 95% on room air.   Serum sodium is 137, potassium 5.4, chloride 109, bicarb 17, BUN 63, serum creatinine is 1.79, EGFR 39, nonfasting blood glucose 278, WBC 12.7, hemoglobin 12.8, platelets of 298.   Lactic acid 2.2. UA was negative for leukocytes and nitrites. Blood cultures x 2 have been collected and are in process.   ED treatment: Sodium chloride 1 L bolus   TRH consulted for admission and further management as below. Assessment and Plan: Severe sepsis [facility]) Patient had a low bated heart rate, respiration rate, leukocytosis of 12.7, elevated lactic acid of 2.2, organ involvement is renal Blood cultures x 2 negative Procalcitonin 0.1 low S/p Vancomycin, cefepime and metronidazole, S/p NS bolus  Continue Tele Vital signs stable, sepsis resolved.  No source of infection.  Discontinued antibiotics on 6/7 Monitor off antibiotics for now     Altered mental status Etiology workup in progress, differentials include severe sepsis with dehydration complicated by nonverbal baseline state in setting of history of CVA Blood cultures in process Treatment per above   Nonverbal At baseline, secondary to history of CVA   Metabolic acidosis, CO2 17--22 improved S/p bicarbonate oral   AKI most likely due to dehydration, Resolved after IVF  Renal functions and urine output daily Continue IV fluid for hydration   Hyperkalemia, Resolved  Presumed secondary to acute kidney  injury Status post sepsis bolus ordered Post sepsis ordered sodium chloride infusion at 125 mL/h, 1 day ordered   Hypophosphatemia due to nutritional deficiency.  Phos repleted. Monitor electrolytes and replete as needed.   Elevated troponin, trending down, most likely demand ischemia Continue tele monitor   Pressure injury of skin Present on admission Wound care consulted   History of CVA in adulthood Aspirin 81 mg daily resumed   Insulin dependent type 2 diabetes mellitus (HCC) Insulin SSI with at bedtime coverage Goal inpatient blood glucose levels 140-180   Essential hypertension Resumed propranolol and irbesartan home dose Hydralazine 5 mg IV every 8 hours as needed for SBP greater than 175, 5 days ordered   # Generalized weakness could be secondary to vitamin B12, folate and vitamin D deficiency. # Vitamin D deficiency: started vitamin D 50,000 units p.o. weekly, follow with PCP to repeat vitamin D level after 3 to 6 months. # Folic acid deficiency, folate level 3.5, started folic acid 1 mg p.o. daily # Vitamin B12 deficiency: B12 level 147, Goal >400, Started vitamin B12 1000 mcg IM injection daily during hospital stay, followed by oral supplement.  Follow-up PCP to repeat vitamin B12 level after 3 to 6 months.       Subjective: seen at the bedside with spouse present patient is unable to swallow capsules.  Physical Exam: Vitals:   02/15/23 0615 02/15/23 0732 02/15/23 1203 02/15/23 1618  BP: (!) 137/94 (!) 138/98 101/74 119/86  Pulse: 72 71 75 72  Resp: 16 20 (!) 25 (!) 22  Temp: (!) 97.3 F (36.3 C) (!) 97.4 F (36.3 C) 98.5 F (36.9 C)  97.8 F (36.6 C)  TempSrc: Axillary     SpO2: 96% 98% 100% 98%  Weight:      Height:      Physical Exam: General: NAD, lying comfortably Appear in no distress, affect appropriate Eyes: PERRLA ENT: Oral Mucosa Clear, moist  Neck: no JVD,  Cardiovascular: S1 and S2 Present, no Murmur,  Respiratory: good respiratory  effort, Bilateral Air entry equal and Decreased, no Crackles, no wheezes Abdomen: Bowel Sound present, Soft and no tenderness,  Skin: no rashes Extremities: no Pedal edema, no calf tenderness Neurologic: without any new focal findings Gait not checked due to patient safety concerns  Data Reviewed:     Family Communication: update spouse at the bedside  Disposition: Status is: Inpatient Remains inpatient appropriate because: monitoring foe sepsis off abx,decline in mental status  Planned Discharge Destination: Skilled nursing facility    Time spent: 37 minutes  Author: Florencia Reasons, MD 02/15/2023 6:13 PM  For on call review [email or web address].

## 2023-02-15 NOTE — Progress Notes (Signed)
Patient unable to swallow the capsules, MD aware of it, requested to change capsules into tablets if possible.

## 2023-02-16 DIAGNOSIS — R6520 Severe sepsis without septic shock: Secondary | ICD-10-CM | POA: Diagnosis not present

## 2023-02-16 DIAGNOSIS — A419 Sepsis, unspecified organism: Secondary | ICD-10-CM | POA: Diagnosis not present

## 2023-02-16 LAB — BASIC METABOLIC PANEL
Anion gap: 8 (ref 5–15)
BUN: 22 mg/dL (ref 8–23)
CO2: 25 mmol/L (ref 22–32)
Calcium: 7.8 mg/dL — ABNORMAL LOW (ref 8.9–10.3)
Chloride: 103 mmol/L (ref 98–111)
Creatinine, Ser: 0.94 mg/dL (ref 0.61–1.24)
GFR, Estimated: >60 mL/min (ref >60)
Glucose, Bld: 114 mg/dL — ABNORMAL HIGH (ref 70–99)
Potassium: 3.4 mmol/L — ABNORMAL LOW (ref 3.5–5.1)
Sodium: 136 mmol/L (ref 135–145)

## 2023-02-16 LAB — PHOSPHORUS: Phosphorus: 2.7 mg/dL (ref 2.5–4.6)

## 2023-02-16 LAB — GLUCOSE, CAPILLARY
Glucose-Capillary: 113 mg/dL — ABNORMAL HIGH (ref 70–99)
Glucose-Capillary: 152 mg/dL — ABNORMAL HIGH (ref 70–99)
Glucose-Capillary: 223 mg/dL — ABNORMAL HIGH (ref 70–99)
Glucose-Capillary: 199 mg/dL — ABNORMAL HIGH (ref 70–99)

## 2023-02-16 LAB — CBC
HCT: 32.2 % — ABNORMAL LOW (ref 39.0–52.0)
Hemoglobin: 11.1 g/dL — ABNORMAL LOW (ref 13.0–17.0)
MCH: 30.6 pg (ref 26.0–34.0)
MCHC: 34.5 g/dL (ref 30.0–36.0)
MCV: 88.7 fL (ref 80.0–100.0)
Platelets: 168 10*3/uL (ref 150–400)
RBC: 3.63 MIL/uL — ABNORMAL LOW (ref 4.22–5.81)
RDW: 12.1 % (ref 11.5–15.5)
WBC: 7 10*3/uL (ref 4.0–10.5)
nRBC: 0 % (ref 0.0–0.2)

## 2023-02-16 LAB — CULTURE, BLOOD (ROUTINE X 2)

## 2023-02-16 LAB — MAGNESIUM: Magnesium: 1.8 mg/dL (ref 1.7–2.4)

## 2023-02-16 MED ORDER — POTASSIUM CHLORIDE CRYS ER 20 MEQ PO TBCR
40.0000 meq | EXTENDED_RELEASE_TABLET | Freq: Once | ORAL | Status: AC
  Administered 2023-02-16: 40 meq via ORAL
  Filled 2023-02-16: qty 2

## 2023-02-16 NOTE — TOC Progression Note (Signed)
Transition of Care [facility]) - Progression Note    Patient Details  Name: Walter Harris MRN: [id number] Date of Birth: April 07, 1946  Transition of Care [facility]) CM/SW Contact  Kemper Durie, RN Phone Number: 02/16/2023, 3:02 PM  Clinical Narrative:     Spoke with wife, state she is ok with [facility], but would really like to consider [facility].  Message sent to [facility] with [facility] to inquire about bed availability, waiting for response.    Expected Discharge Plan: Skilled Nursing Facility Barriers to Discharge: Continued Medical Work up  Expected Discharge Plan and Services       Living arrangements for the past 2 months: Single Family Home                                       Social Determinants of Health (SDOH) Interventions SDOH Screenings   Food Insecurity: No Food Insecurity (11/01/2022)  Housing: High Risk (02/13/2023)  Transportation Needs: Patient Unable To Answer (02/13/2023)  Utilities: Patient Unable To Answer (02/13/2023)  Alcohol Screen: Low Risk  (10/31/2021)  Depression (PHQ2-9): Low Risk  (11/01/2022)  Financial Resource Strain: Low Risk  (11/01/2022)  Physical Activity: Inactive (11/01/2022)  Social Connections: Socially Isolated (11/01/2022)  Stress: No Stress Concern Present (11/01/2022)  Tobacco Use: Medium Risk (02/13/2023)    Readmission Risk Interventions     No data to display

## 2023-02-16 NOTE — Progress Notes (Signed)
Progress Note   Patient: Walter Harris:[id number] DOB: August 20, 1946 DOA: 02/12/2023     4 DOS: the patient was seen and examined on 02/16/2023   Brief hospital course: Ms. Cornelis Eadie is a 77 year old male with history of prior CVA with baseline nonverbal status, insulin-dependent diabetes mellitus, hypertension, hyperlipidemia, who presents emergency department for chief concerns of generalized weakness.   Vitals in the ED showed temperature of 98.2, respiration rate of 21, heart rate of 105, blood pressure 164/95, SpO2 95% on room air.   Serum sodium is 137, potassium 5.4, chloride 109, bicarb 17, BUN 63, serum creatinine is 1.79, EGFR 39, nonfasting blood glucose 278, WBC 12.7, hemoglobin 12.8, platelets of 298.   Lactic acid 2.2. UA was negative for leukocytes and nitrites. Blood cultures x 2 have been collected and are in process.   ED treatment: Sodium chloride 1 L bolus   TRH consulted for admission and further management as below. Assessment and Plan: Severe sepsis [facility]) Patient had a high heart rate, respiration rate, leukocytosis of 12.7, elevated lactic acid of 2.2, organ involvement is renal Blood cultures x 2 negative Procalcitonin 0.1 low S/p Vancomycin, cefepime and metronidazole, S/p NS bolus  Continue Tele Vital signs stable, sepsis resolved.  No source of infection.  Discontinued antibiotics on 6/7 Monitor off antibiotics for now     Altered mental status Etiology workup in progress, differentials include severe sepsis with dehydration complicated by nonverbal baseline state in setting of history of CVA Blood cultures in process Treatment per above   Nonverbal At baseline, secondary to history of CVA   Metabolic acidosis, CO2 17--22 improved S/p bicarbonate oral   AKI most likely due to dehydration, Resolved after IVF  Renal functions and urine output daily Continue IV fluid for hydration   Hyperkalemia, Resolved  Presumed secondary to acute kidney  injury Status post sepsis bolus ordered Post sepsis ordered sodium chloride infusion at 125 mL/h, 1 day ordered   Hypophosphatemia due to nutritional deficiency.  Phos repleted. Monitor electrolytes and replete as needed.   Elevated troponin, trending down, most likely demand ischemia Continue tele monitor   Pressure injury of skin Present on admission Wound care consulted   History of CVA in adulthood Aspirin 81 mg daily resumed   Insulin dependent type 2 diabetes mellitus (HCC) Insulin SSI with at bedtime coverage Goal inpatient blood glucose levels 140-180   Essential hypertension Resumed propranolol and irbesartan home dose Hydralazine 5 mg IV every 8 hours as needed for SBP greater than 175, 5 days ordered   # Generalized weakness could be secondary to vitamin B12, folate and vitamin D deficiency. # Vitamin D deficiency: started vitamin D 50,000 units p.o. weekly, follow with PCP to repeat vitamin D level after 3 to 6 months. # Folic acid deficiency, folate level 3.5, started folic acid 1 mg p.o. daily # Vitamin B12 deficiency: B12 level 147, Goal >400, Started vitamin B12 1000 mcg IM injection daily during hospital stay, followed by oral supplement.  Follow-up PCP to repeat vitamin B12 level after 3 to 6 months.       Subjective: seen at the bedside with spouse present,does not have loss of  appetite.  Physical Exam: Vitals:   02/16/23 0051 02/16/23 0603 02/16/23 0817 02/16/23 1225  BP: 100/73 90/71 (!) 166/82 133/69  Pulse: 78 72 84 74  Resp: 18 18 16 16   Temp: 97.7 F (36.5 C) 98 F (36.7 C) 97.7 F (36.5 C) 98.1 F (36.7 C)  TempSrc:  SpO2: 97% 96% 99% 99%  Weight:      Height:      Physical Exam: General: NAD, lying comfortably Appear in no distress, affect appropriate Eyes: PERRLA ENT: Oral Mucosa Clear, moist  Neck: no JVD,  Cardiovascular: S1 and S2 Present, no Murmur,  Respiratory: good respiratory effort, Bilateral Air entry equal and  Decreased, no Crackles, no wheezes Abdomen: Bowel Sound present, Soft and no tenderness,  Skin: no rashes Extremities: no Pedal edema, no calf tenderness Neurologic: without any new focal findings Gait not checked due to patient safety concerns  Data Reviewed:     Family Communication: update spouse at the bedside  Disposition: Status is: Inpatient Remains inpatient appropriate because: monitoring for sepsis off abx,decline in mental status  Planned Discharge Destination: Skilled nursing facility    Time spent: 35 minutes  Author: Florencia Reasons, MD 02/16/2023 3:10 PM  For on call review [email or web address].

## 2023-02-17 DIAGNOSIS — A419 Sepsis, unspecified organism: Secondary | ICD-10-CM | POA: Diagnosis not present

## 2023-02-17 DIAGNOSIS — R531 Weakness: Secondary | ICD-10-CM

## 2023-02-17 DIAGNOSIS — Z8673 Personal history of transient ischemic attack (TIA), and cerebral infarction without residual deficits: Secondary | ICD-10-CM

## 2023-02-17 DIAGNOSIS — R4701 Aphasia: Secondary | ICD-10-CM

## 2023-02-17 DIAGNOSIS — R410 Disorientation, unspecified: Secondary | ICD-10-CM

## 2023-02-17 DIAGNOSIS — E119 Type 2 diabetes mellitus without complications: Secondary | ICD-10-CM

## 2023-02-17 DIAGNOSIS — E1142 Type 2 diabetes mellitus with diabetic polyneuropathy: Secondary | ICD-10-CM

## 2023-02-17 DIAGNOSIS — Z794 Long term (current) use of insulin: Secondary | ICD-10-CM

## 2023-02-17 DIAGNOSIS — I10 Essential (primary) hypertension: Secondary | ICD-10-CM

## 2023-02-17 LAB — BASIC METABOLIC PANEL
Anion gap: 7 (ref 5–15)
BUN: 25 mg/dL — ABNORMAL HIGH (ref 8–23)
CO2: 25 mmol/L (ref 22–32)
Calcium: 8 mg/dL — ABNORMAL LOW (ref 8.9–10.3)
Chloride: 103 mmol/L (ref 98–111)
Creatinine, Ser: 1.11 mg/dL (ref 0.61–1.24)
GFR, Estimated: >60 mL/min (ref >60)
Glucose, Bld: 150 mg/dL — ABNORMAL HIGH (ref 70–99)
Potassium: 4.3 mmol/L (ref 3.5–5.1)
Sodium: 135 mmol/L (ref 135–145)

## 2023-02-17 LAB — CBC
HCT: 32.4 % — ABNORMAL LOW (ref 39.0–52.0)
Hemoglobin: 11 g/dL — ABNORMAL LOW (ref 13.0–17.0)
MCH: 30.7 pg (ref 26.0–34.0)
MCHC: 34 g/dL (ref 30.0–36.0)
MCV: 90.5 fL (ref 80.0–100.0)
Platelets: 164 10*3/uL (ref 150–400)
RBC: 3.58 MIL/uL — ABNORMAL LOW (ref 4.22–5.81)
RDW: 12.2 % (ref 11.5–15.5)
WBC: 8 10*3/uL (ref 4.0–10.5)
nRBC: 0 % (ref 0.0–0.2)

## 2023-02-17 LAB — GLUCOSE, CAPILLARY
Glucose-Capillary: 135 mg/dL — ABNORMAL HIGH (ref 70–99)
Glucose-Capillary: 200 mg/dL — ABNORMAL HIGH (ref 70–99)
Glucose-Capillary: 192 mg/dL — ABNORMAL HIGH (ref 70–99)

## 2023-02-17 LAB — CULTURE, BLOOD (ROUTINE X 2)
Culture: NO GROWTH
Special Requests: ADEQUATE

## 2023-02-17 NOTE — Care Management Important Message (Signed)
Important Message  Patient Details  Name: Walter Harris MRN: [id number] Date of Birth: Apr 25, 1946   Medicare Important Message Given:  Yes     Johnell Comings 02/17/2023, 12:02 PM

## 2023-02-17 NOTE — TOC Progression Note (Signed)
Transition of Care [facility]) - Progression Note    Patient Details  Name: Walter Harris MRN: [id number] Date of Birth: [birth year]  Transition of Care [facility]) CM/SW Contact  Truddie Hidden, RN Phone Number: 02/17/2023, 1:05 PM  Clinical Narrative:    Sherron Monday with [facility] regarding bed offer. Referral being reviewed.    Expected Discharge Plan: Skilled Nursing Facility Barriers to Discharge: Continued Medical Work up  Expected Discharge Plan and Services       Living arrangements for the past 2 months: Single Family Home                                       Social Determinants of Health (SDOH) Interventions SDOH Screenings   Food Insecurity: No Food Insecurity (11/01/2022)  Housing: High Risk (02/13/2023)  Transportation Needs: Patient Unable To Answer (02/13/2023)  Utilities: Patient Unable To Answer (02/13/2023)  Alcohol Screen: Low Risk  (10/31/2021)  Depression (PHQ2-9): Low Risk  (11/01/2022)  Financial Resource Strain: Low Risk  (11/01/2022)  Physical Activity: Inactive (11/01/2022)  Social Connections: Socially Isolated (11/01/2022)  Stress: No Stress Concern Present (11/01/2022)  Tobacco Use: Medium Risk (02/13/2023)    Readmission Risk Interventions     No data to display

## 2023-02-17 NOTE — Progress Notes (Signed)
OT Cancellation Note  Patient Details Name: Walter Harris MRN: [id number] DOB: 27-Jun-1946   Cancelled Treatment:    Reason Eval/Treat Not Completed: Fatigue/lethargy limiting ability to participate. Pt sleeping soundly, does not easily wake to cues. Will re-attempt at later date/time.   Arman Filter., MPH, MS, OTR/L ascom [phone] 02/17/23, 4:43 PM

## 2023-02-17 NOTE — Progress Notes (Signed)
Progress Note   Patient: Walter Harris:[id number] DOB: 17-Feb-1946 DOA: 02/12/2023     5 DOS: the patient was seen and examined on 02/17/2023   Brief hospital course: [name] is a 77 year old male with history of prior CVA with baseline nonverbal status, insulin-dependent diabetes mellitus, hypertension, hyperlipidemia, who presents emergency department for chief concerns of generalized weakness.   Vitals in the ED showed temperature of 98.2, respiration rate of 21, heart rate of 105, blood pressure 164/95, SpO2 95% on room air.   Serum sodium is 137, potassium 5.4, chloride 109, bicarb 17, BUN 63, serum creatinine is 1.79, EGFR 39, nonfasting blood glucose 278, WBC 12.7, hemoglobin 12.8, platelets of 298.   Lactic acid 2.2. UA was negative for leukocytes and nitrites. Blood cultures x 2 have been collected and are in process.   ED treatment: Sodium chloride 1 L bolus   TRH consulted for admission and further management as below.  6/10: Remained hemodynamically stable.  Awaiting SNF placement.  Assessment and Plan: Severe sepsis [facility]) Patient had a high heart rate, respiration rate, leukocytosis of 12.7, elevated lactic acid of 2.2, organ involvement is renal Blood cultures x 2 negative Procalcitonin 0.1 low S/p Vancomycin, cefepime and metronidazole, S/p NS bolus  Continue Tele Vital signs stable, sepsis resolved.  No source of infection.  Discontinued antibiotics on 6/7 Monitor off antibiotics for now-sepsis ruled out     Altered mental status Resolved.  Now at baseline.   Nonverbal At baseline, secondary to history of CVA   Metabolic acidosis, CO2 17--22 improved S/p bicarbonate oral   AKI most likely due to dehydration, Resolved after IVF  Renal functions and urine output daily Continue IV fluid for hydration   Hyperkalemia, Resolved  Presumed secondary to acute kidney injury Status post sepsis bolus ordered Post sepsis ordered sodium chloride infusion at  125 mL/h, 1 day ordered   Hypophosphatemia due to nutritional deficiency.  Phos repleted. Monitor electrolytes and replete as needed.   Elevated troponin, trending down, most likely demand ischemia Continue tele monitor   Pressure injury of skin Present on admission Wound care consulted   History of CVA in adulthood Aspirin 81 mg daily resumed   Insulin dependent type 2 diabetes mellitus (HCC) Insulin SSI with at bedtime coverage Goal inpatient blood glucose levels 140-180   Essential hypertension Resumed propranolol and irbesartan home dose Hydralazine 5 mg IV every 8 hours as needed for SBP greater than 175, 5 days ordered   # Generalized weakness could be secondary to vitamin B12, folate and vitamin D deficiency. # Vitamin D deficiency: started vitamin D 50,000 units p.o. weekly, follow with PCP to repeat vitamin D level after 3 to 6 months. # Folic acid deficiency, folate level 3.5, started folic acid 1 mg p.o. daily # Vitamin B12 deficiency: B12 level 147, Goal >400, Started vitamin B12 1000 mcg IM injection daily during hospital stay, followed by oral supplement.  Follow-up PCP to repeat vitamin B12 level after 3 to 6 months.       Subjective: Patient was seen and examined today.  No new concern.  Wife at bedside.  Patient is nonverbal.  Physical Exam: Vitals:   02/16/23 2000 02/16/23 2344 02/17/23 0352 02/17/23 0844  BP: (!) 140/64 128/78 (!) 141/78 (!) 148/85  Pulse: 84 81 77 81  Resp: (!) 26 20 20  (!) 21  Temp: 97.8 F (36.6 C) 98.3 F (36.8 C) 98 F (36.7 C) 98.1 F (36.7 C)  TempSrc:  Oral  Oral   SpO2: 98% 96% 100% 96%  Weight:      Height:      Physical Exam:  General.  Frail, nonverbal elderly man, in no acute distress. Pulmonary.  Lungs clear bilaterally, normal respiratory effort. CV.  Regular rate and rhythm, no JVD, rub or murmur. Abdomen.  Soft, nontender, nondistended, BS positive. CNS.  Alert, nonverbal, no new focal deficit Extremities.  No  edema, no cyanosis, pulses intact and symmetrical. Psychiatry.  Appears to have some cognitive impairment  Data Reviewed: Prior data reviewed  Family Communication: Discussed with wife at bedside  Disposition: Status is: Inpatient Remains inpatient appropriate because: Severity of illness.  Now medically stable  Planned Discharge Destination: Skilled nursing facility  DVT prophylaxis.  Eliquis Time spent: 40 minutes  This record has been created using Conservation officer, historic buildings. Errors have been sought and corrected,but may not always be located. Such creation errors do not reflect on the standard of care.   Author: Arnetha Courser, MD 02/17/2023 2:26 PM  For on call review [email or web address].

## 2023-02-17 NOTE — Progress Notes (Signed)
Physical Therapy Treatment Patient Details Name: Walter Harris MRN: [id number] DOB: [birth year] Today's Date: 02/17/2023   History of Present Illness 77 y/o male presented to ED on 02/12/23 for generalized weakness and code sepsis. PMH: CVA with baseline, DM, HTN.    PT Comments    Pt resting in bed upon PT arrival; pt with minimal verbalizations (single words) during session; nurse reports pt was up in chair today (staff used sit to stand lift).  During session pt mod to max assist with bed mobility and to stand x5 trials (pt only able to achieve full upright stand x2 trials).  Pt appearing to fatigue quickly today requiring increased assist.  Will continue to focus on strengthening, balance, and progressive functional mobility during hospitalization.    Recommendations for follow up therapy are one component of a multi-disciplinary discharge planning process, led by the attending physician.  Recommendations may be updated based on patient status, additional functional criteria and insurance authorization.  Follow Up Recommendations  Can patient physically be transported by private vehicle: No    Assistance Recommended at Discharge Frequent or constant Supervision/Assistance  Patient can return home with the following A lot of help with walking and/or transfers;A lot of help with bathing/dressing/bathroom;Assistance with cooking/housework;Direct supervision/assist for medications management;Direct supervision/assist for financial management;Assist for transportation;Help with stairs or ramp for entrance   Equipment Recommendations  Other (comment) (TBD at next venue of care)    Recommendations for Other Services       Precautions / Restrictions Precautions Precautions: Fall Restrictions Weight Bearing Restrictions: No     Mobility  Bed Mobility Overal bed mobility: Needs Assistance Bed Mobility: Supine to Sit, Sit to Supine     Supine to sit: Mod assist, Max assist Sit to  supine: Mod assist, Max assist   General bed mobility comments: assist for trunk and B LE's; vc's for technique    Transfers Overall transfer level: Needs assistance Equipment used: 1 person hand held assist Transfers: Sit to/from Stand Sit to Stand: Mod assist, Max assist           General transfer comment: x5 trials (sit to/from stand) from bed; vc's for technique; pt able to come to full stand x2 trials and 3/4th stand x3 trials    Ambulation/Gait Ambulation/Gait assistance:  (pt unable to maintain standing to attempt)                 Stairs             Wheelchair Mobility    Modified Rankin (Stroke Patients Only)       Balance Overall balance assessment: Needs assistance Sitting-balance support: Bilateral upper extremity supported, Feet supported Sitting balance-Leahy Scale: Fair Sitting balance - Comments: steady static sitting (mild L lean)   Standing balance support: Single extremity supported   Standing balance comment: assist to steady in standing                            Cognition Arousal/Alertness: Awake/alert Behavior During Therapy: WFL for tasks assessed/performed                                   General Comments: Pt with minimal verbalizations (single words) during session.  Did not answer A&O questions.        Exercises      General Comments  Nursing cleared pt for participation in  physical therapy.  Pt agreeable to PT session.      Pertinent Vitals/Pain Pain Assessment Pain Assessment: Faces Pain Score: 0-No pain Pain Intervention(s): Limited activity within patient's tolerance, Monitored during session, Repositioned Vitals (HR and O2 on room air) stable and WFL throughout treatment session.    Home Living                          Prior Function            PT Goals (current goals can now be found in the care plan section) Acute Rehab PT Goals Patient Stated Goal: did not  state PT Goal Formulation: Patient unable to participate in goal setting Time For Goal Achievement: 02/27/23 Potential to Achieve Goals: Fair Progress towards PT goals: Progressing toward goals    Frequency    Min 2X/week      PT Plan Current plan remains appropriate    Co-evaluation              AM-PAC PT "6 Clicks" Mobility   Outcome Measure  Help needed turning from your back to your side while in a flat bed without using bedrails?: A Lot Help needed moving from lying on your back to sitting on the side of a flat bed without using bedrails?: A Lot Help needed moving to and from a bed to a chair (including a wheelchair)?: A Lot Help needed standing up from a chair using your arms (e.g., wheelchair or bedside chair)?: A Lot Help needed to walk in hospital room?: Total Help needed climbing 3-5 steps with a railing? : Total 6 Click Score: 10    End of Session Equipment Utilized During Treatment: Gait belt Activity Tolerance: Patient limited by fatigue Patient left: in bed;with call bell/phone within reach;with bed alarm set;with nursing/sitter in room;Other (comment) (fall mat in place; nurse present) Nurse Communication: Mobility status;Precautions;Need for lift equipment PT Visit Diagnosis: Unsteadiness on feet (R26.81);Muscle weakness (generalized) (M62.81);Other abnormalities of gait and mobility (R26.89)     Time: 1610-9604 PT Time Calculation (min) (ACUTE ONLY): 19 min  Charges:  $Therapeutic Activity: 8-22 mins                     Hendricks Limes, PT 02/17/23, 5:34 PM

## 2023-02-18 DIAGNOSIS — A419 Sepsis, unspecified organism: Secondary | ICD-10-CM | POA: Diagnosis not present

## 2023-02-18 DIAGNOSIS — E1142 Type 2 diabetes mellitus with diabetic polyneuropathy: Secondary | ICD-10-CM | POA: Diagnosis not present

## 2023-02-18 DIAGNOSIS — R531 Weakness: Secondary | ICD-10-CM | POA: Diagnosis not present

## 2023-02-18 DIAGNOSIS — R410 Disorientation, unspecified: Secondary | ICD-10-CM | POA: Diagnosis not present

## 2023-02-18 LAB — GLUCOSE, CAPILLARY
Glucose-Capillary: 116 mg/dL — ABNORMAL HIGH (ref 70–99)
Glucose-Capillary: 182 mg/dL — ABNORMAL HIGH (ref 70–99)
Glucose-Capillary: 227 mg/dL — ABNORMAL HIGH (ref 70–99)
Glucose-Capillary: 228 mg/dL — ABNORMAL HIGH (ref 70–99)
Glucose-Capillary: 139 mg/dL — ABNORMAL HIGH (ref 70–99)

## 2023-02-18 LAB — CBC
HCT: 33.7 % — ABNORMAL LOW (ref 39.0–52.0)
Hemoglobin: 11.5 g/dL — ABNORMAL LOW (ref 13.0–17.0)
MCH: 30.8 pg (ref 26.0–34.0)
MCHC: 34.1 g/dL (ref 30.0–36.0)
MCV: 90.3 fL (ref 80.0–100.0)
Platelets: 191 10*3/uL (ref 150–400)
RBC: 3.73 MIL/uL — ABNORMAL LOW (ref 4.22–5.81)
RDW: 12.1 % (ref 11.5–15.5)
WBC: 8.9 10*3/uL (ref 4.0–10.5)
nRBC: 0 % (ref 0.0–0.2)

## 2023-02-18 LAB — BASIC METABOLIC PANEL
Anion gap: 7 (ref 5–15)
BUN: 21 mg/dL (ref 8–23)
CO2: 24 mmol/L (ref 22–32)
Calcium: 8 mg/dL — ABNORMAL LOW (ref 8.9–10.3)
Chloride: 104 mmol/L (ref 98–111)
Creatinine, Ser: 0.91 mg/dL (ref 0.61–1.24)
GFR, Estimated: >60 mL/min (ref >60)
Glucose, Bld: 117 mg/dL — ABNORMAL HIGH (ref 70–99)
Potassium: 4.2 mmol/L (ref 3.5–5.1)
Sodium: 135 mmol/L (ref 135–145)

## 2023-02-18 MED ORDER — ADULT MULTIVITAMIN W/MINERALS CH
1.0000 | ORAL_TABLET | Freq: Every day | ORAL | Status: DC
  Administered 2023-02-18 – 2023-02-24 (×7): 1 via ORAL
  Filled 2023-02-18 (×7): qty 1

## 2023-02-18 NOTE — Progress Notes (Signed)
Occupational Therapy Treatment Patient Details Name: Walter Harris MRN: [id number] DOB: 16-Apr-1946 Today's Date: 02/18/2023   History of present illness 77 y/o male presented to ED on 02/12/23 for generalized weakness and code sepsis. PMH: CVA with baseline, DM, HTN   OT comments  Pt seen for OT tx. Pt required MOD A +2 for sup>sit EOB. Once EOB, required CGA-MIN A varying assist for static sitting balance. Mobility/nurse tech utilizing [employer] for transfer to recliner with VC for improved use. Once seated in recliner and pillow positioned on L side to support improved upright posture, pt engaged in grooming tasks, requiring MIN-MOD A to complete with multimodal simple cues to initiate and complete tasks. Pt able to point to call bell to ensure access. Pt continues to benefit from skilled OT services. Continue with OT POC.    Recommendations for follow up therapy are one component of a multi-disciplinary discharge planning process, led by the attending physician.  Recommendations may be updated based on patient status, additional functional criteria and insurance authorization.    Assistance Recommended at Discharge Frequent or constant Supervision/Assistance  Patient can return home with the following  A lot of help with walking and/or transfers;A lot of help with bathing/dressing/bathroom;Help with stairs or ramp for entrance   Equipment Recommendations  BSC/3in1;Hospital bed    Recommendations for Other Services      Precautions / Restrictions Precautions Precautions: Fall Restrictions Weight Bearing Restrictions: No       Mobility Bed Mobility Overal bed mobility: Needs Assistance Bed Mobility: Supine to Sit     Supine to sit: Mod assist, +2 for physical assistance     General bed mobility comments: VC to initiate improving pt's effort to complete, but still requiring MOD A +2    Transfers                   General transfer comment: NT, OT provided +2 safety  during transfer with Rozell Searing lift by mobility tech/nurse tech     Balance Overall balance assessment: Needs assistance Sitting-balance support: Single extremity supported, Feet supported Sitting balance-Leahy Scale: Fair Sitting balance - Comments: static sitting, mild L lean countered with pillow placement for improved upright posture                                   ADL either performed or assessed with clinical judgement   ADL                                         General ADL Comments: Pt required MIN-MOD A for seated grooming tasks with multimodal simple cues to initiate and to complete.    Extremity/Trunk Assessment              Vision       Perception     Praxis      Cognition Arousal/Alertness: Awake/alert Behavior During Therapy: Flat affect Overall Cognitive Status: No family/caregiver present to determine baseline cognitive functioning                                 General Comments: Very minimal verbalizations during session, nods head to intermittent yes/no questions        Exercises      Shoulder Instructions  General Comments      Pertinent Vitals/ Pain       Pain Assessment Pain Assessment: Faces Faces Pain Scale: No hurt  Home Living                                          Prior Functioning/Environment              Frequency  Min 2X/week        Progress Toward Goals  OT Goals(current goals can now be found in the care plan section)  Progress towards OT goals: OT to reassess next treatment  Acute Rehab OT Goals Patient Stated Goal: to improve pain OT Goal Formulation: With patient Time For Goal Achievement: 02/27/23 Potential to Achieve Goals: Fair  Plan Discharge plan remains appropriate;Frequency remains appropriate    Co-evaluation                 AM-PAC OT "6 Clicks" Daily Activity     Outcome Measure   Help from another person  eating meals?: A Little Help from another person taking care of personal grooming?: A Little Help from another person toileting, which includes using toliet, bedpan, or urinal?: A Lot Help from another person bathing (including washing, rinsing, drying)?: A Lot Help from another person to put on and taking off regular upper body clothing?: A Lot Help from another person to put on and taking off regular lower body clothing?: A Lot 6 Click Score: 14    End of Session    OT Visit Diagnosis: Unsteadiness on feet (R26.81);Muscle weakness (generalized) (M62.81)   Activity Tolerance Patient tolerated treatment well   Patient Left in chair;with call bell/phone within reach;with chair alarm set   Nurse Communication          Time: 1610-9604 OT Time Calculation (min): 18 min  Charges: OT General Charges $OT Visit: 1 Visit OT Treatments $Self Care/Home Management : 8-22 mins  Arman Filter., MPH, MS, OTR/L ascom [phone] 02/18/23, 4:17 PM

## 2023-02-18 NOTE — Progress Notes (Signed)
Progress Note   Patient: Walter Harris:[id number] DOB: 1946-01-19 DOA: 02/12/2023     6 DOS: the patient was seen and examined on 02/18/2023   Brief hospital course: [name] is a 77 year old male with history of prior CVA with baseline nonverbal status, insulin-dependent diabetes mellitus, hypertension, hyperlipidemia, who presents emergency department for chief concerns of generalized weakness.   Vitals in the ED showed temperature of 98.2, respiration rate of 21, heart rate of 105, blood pressure 164/95, SpO2 95% on room air.   Serum sodium is 137, potassium 5.4, chloride 109, bicarb 17, BUN 63, serum creatinine is 1.79, EGFR 39, nonfasting blood glucose 278, WBC 12.7, hemoglobin 12.8, platelets of 298.   Lactic acid 2.2. UA was negative for leukocytes and nitrites. Blood cultures x 2 have been collected and are in process.   ED treatment: Sodium chloride 1 L bolus   TRH consulted for admission and further management as below.  6/10: Remained hemodynamically stable.  Awaiting SNF placement.  Sepsis ruled out as there was no obvious source of infection.  6/11: Medically stable-awaiting SNF placement.  Assessment and Plan: Severe sepsis [facility]) Patient had a high heart rate, respiration rate, leukocytosis of 12.7, elevated lactic acid of 2.2, organ involvement is renal Blood cultures x 2 negative Procalcitonin 0.1 low S/p Vancomycin, cefepime and metronidazole, S/p NS bolus  Continue Tele Vital signs stable, sepsis resolved.  No source of infection.  Discontinued antibiotics on 6/7 Monitor off antibiotics for now-sepsis ruled out     Altered mental status Resolved.  Now at baseline.   Nonverbal At baseline, secondary to history of CVA   Metabolic acidosis, CO2 17--22 improved S/p bicarbonate oral   AKI most likely due to dehydration, Resolved after IVF  Renal functions and urine output daily Continue IV fluid for hydration   Hyperkalemia, Resolved  Presumed  secondary to acute kidney injury Status post sepsis bolus ordered Post sepsis ordered sodium chloride infusion at 125 mL/h, 1 day ordered   Hypophosphatemia due to nutritional deficiency.  Phos repleted. Monitor electrolytes and replete as needed.   Elevated troponin, trending down, most likely demand ischemia Continue tele monitor   Pressure injury of skin Present on admission Wound care consulted   History of CVA in adulthood Aspirin 81 mg daily resumed   Insulin dependent type 2 diabetes mellitus (HCC) Insulin SSI with at bedtime coverage Goal inpatient blood glucose levels 140-180   Essential hypertension Resumed propranolol and irbesartan home dose Hydralazine 5 mg IV every 8 hours as needed for SBP greater than 175, 5 days ordered   # Generalized weakness could be secondary to vitamin B12, folate and vitamin D deficiency. # Vitamin D deficiency: started vitamin D 50,000 units p.o. weekly, follow with PCP to repeat vitamin D level after 3 to 6 months. # Folic acid deficiency, folate level 3.5, started folic acid 1 mg p.o. daily # Vitamin B12 deficiency: B12 level 147, Goal >400, Started vitamin B12 1000 mcg IM injection daily during hospital stay, followed by oral supplement.  Follow-up PCP to repeat vitamin B12 level after 3 to 6 months.       Subjective: Patient was lying down in bed.  No new concern.  Remain nonverbal.  Physical Exam: Vitals:   02/17/23 2341 02/18/23 0425 02/18/23 0748 02/18/23 1326  BP: 94/72 107/86 98/74 103/77  Pulse: 89 84 85 84  Resp: 16 20 18 20   Temp: 98.1 F (36.7 C) 98.1 F (36.7 C) 98.1 F (36.7 C)  98.3 F (36.8 C)  TempSrc:      SpO2: 97% 97% 96% 96%  Weight:      Height:      Physical Exam:  General.  Nonverbal elderly man, in no acute distress. Pulmonary.  Lungs clear bilaterally, normal respiratory effort. CV.  Regular rate and rhythm, no JVD, rub or murmur. Abdomen.  Soft, nontender, nondistended, BS positive. CNS.   Awake, nonverbal, no apparent neurodeficit Extremities.  No edema, no cyanosis, pulses intact and symmetrical. Psychiatry.  Judgment and insight appears impaired  Data Reviewed: Prior data reviewed  Family Communication:   Disposition: Status is: Inpatient Remains inpatient appropriate because: Severity of illness.  Now medically stable  Planned Discharge Destination: Skilled nursing facility  DVT prophylaxis.  Eliquis Time spent: 38 minutes  This record has been created using Conservation officer, historic buildings. Errors have been sought and corrected,but may not always be located. Such creation errors do not reflect on the standard of care.   Author: Arnetha Courser, MD 02/18/2023 3:01 PM  For on call review [email or web address].

## 2023-02-18 NOTE — Progress Notes (Signed)
Initial Nutrition Assessment  DOCUMENTATION CODES:   Severe malnutrition in context of chronic illness  INTERVENTION:   -Continue Ensure Enlive po BID, each supplement provides 350 kcal and 20 grams of protein -Magic cup TID with meals, each supplement provides 290 kcal and 9 grams of protein  -MVI with minerals daily -Feeding assistance with meals -Liberalize diet to regular for widest variety of meal selections  NUTRITION DIAGNOSIS:   Severe Malnutrition related to chronic illness (CVA) as evidenced by percent weight loss, moderate fat depletion, severe fat depletion, moderate muscle depletion, severe muscle depletion.  GOAL:   Patient will meet greater than or equal to 90% of their needs  MONITOR:   PO intake, Supplement acceptance  REASON FOR ASSESSMENT:   Malnutrition Screening Tool    ASSESSMENT:   Pt with history of prior CVA with baseline nonverbal status, insulin-dependent diabetes mellitus, hypertension, hyperlipidemia, who presents for chief concerns of generalized weakness.  Pt admitted with severe sepsis and AMS.   Reviewed I/O's: -920 ml x 24 hours and +782 ml since admission   Pt sitting up in bed at time of visit. Pt not communicative with this RD; per H&P, pt is nonverbal from past CVA. No family at bedside to provide further history.   Pt currently on a carb modified diet; noted meal completions 30-75%. Pt also drinking Ensure supplements.   Reviewed wt hx; pt has experienced a 10.6% wt loss over the past 3 months, which is significant for time frame.   Per TOC notes, plan for SNF placement.   Medications reviewed and include vitamin B-12, folic acid, and vitamin D.   Lab Results  Component Value Date   HGBA1C 8.2 (H) 02/13/2023   PTA DM medications are 500 mg metformin BID. Per ADA's Standards of Medical Care of Diabetes, glycemic targets for older adults who have multiple co-morbidities, cognitive impairments, and functional dependence should be  less stringent (Hgb A1c <8.0-8.5).    Labs reviewed: CBGS: 116-182 (inpatient orders for glycemic control are 0-5 units insulin aspart daily at bedtime, 0-9 units insulin aspart TID with meals, and 20 units insulin glargine-yfgn daily).    NUTRITION - FOCUSED PHYSICAL EXAM:  Flowsheet Row Most Recent Value  Orbital Region Moderate depletion  Upper Arm Region Severe depletion  Thoracic and Lumbar Region Moderate depletion  Buccal Region Severe depletion  Temple Region Severe depletion  Clavicle Bone Region Severe depletion  Clavicle and Acromion Bone Region Severe depletion  Dorsal Hand Moderate depletion  Patellar Region Moderate depletion  Anterior Thigh Region Moderate depletion  Posterior Calf Region Moderate depletion  Edema (RD Assessment) Mild  Hair Reviewed  Eyes Reviewed  Mouth Reviewed  Skin Reviewed  Nails Reviewed       Diet Order:   Diet Order             Diet Carb Modified Fluid consistency: Thin; Room service appropriate? Yes  Diet effective now                   EDUCATION NEEDS:   Not appropriate for education at this time  Skin:  Skin Assessment: Skin Integrity Issues: Skin Integrity Issues:: Stage I Stage I: lt and rt ischial tuberosity  Last BM:  02/15/23  Height:   Ht Readings from Last 1 Encounters:  02/12/23 5\' 8"  (1.727 m)    Weight:   Wt Readings from Last 1 Encounters:  02/12/23 75.8 kg    Ideal Body Weight:  70 kg  BMI:  Body  mass index is 25.41 kg/m.  Estimated Nutritional Needs:   Kcal:  2100-2300  Protein:  105-120 grams  Fluid:  > 2 L    Levada Schilling, RD, LDN, CDCES Registered Dietitian II Certified Diabetes Care and Education Specialist Please refer to [facility] for RD and/or RD on-call/weekend/after hours pager

## 2023-02-18 NOTE — TOC Progression Note (Signed)
Transition of Care [facility]) - Progression Note    Patient Details  Name: Walter Harris MRN: [id number] Date of Birth: 1946/03/05  Transition of Care [facility]) CM/SW Contact  Truddie Hidden, RN Phone Number: 02/18/2023, 2:18 PM  Clinical Narrative:    Attempt to reach Tiffany at [facility] for update regarding referral.  No answer. Left a message.    Expected Discharge Plan: Skilled Nursing Facility Barriers to Discharge: Continued Medical Work up  Expected Discharge Plan and Services       Living arrangements for the past 2 months: Single Family Home                                       Social Determinants of Health (SDOH) Interventions SDOH Screenings   Food Insecurity: No Food Insecurity (11/01/2022)  Housing: High Risk (02/13/2023)  Transportation Needs: Patient Unable To Answer (02/13/2023)  Utilities: Patient Unable To Answer (02/13/2023)  Alcohol Screen: Low Risk  (10/31/2021)  Depression (PHQ2-9): Low Risk  (11/01/2022)  Financial Resource Strain: Low Risk  (11/01/2022)  Physical Activity: Inactive (11/01/2022)  Social Connections: Socially Isolated (11/01/2022)  Stress: No Stress Concern Present (11/01/2022)  Tobacco Use: Medium Risk (02/13/2023)    Readmission Risk Interventions     No data to display

## 2023-02-19 DIAGNOSIS — R410 Disorientation, unspecified: Secondary | ICD-10-CM | POA: Diagnosis not present

## 2023-02-19 DIAGNOSIS — A419 Sepsis, unspecified organism: Secondary | ICD-10-CM | POA: Diagnosis not present

## 2023-02-19 DIAGNOSIS — E43 Unspecified severe protein-calorie malnutrition: Secondary | ICD-10-CM | POA: Insufficient documentation

## 2023-02-19 DIAGNOSIS — E1142 Type 2 diabetes mellitus with diabetic polyneuropathy: Secondary | ICD-10-CM | POA: Diagnosis not present

## 2023-02-19 DIAGNOSIS — R531 Weakness: Secondary | ICD-10-CM | POA: Diagnosis not present

## 2023-02-19 LAB — GLUCOSE, CAPILLARY
Glucose-Capillary: 118 mg/dL — ABNORMAL HIGH (ref 70–99)
Glucose-Capillary: 290 mg/dL — ABNORMAL HIGH (ref 70–99)
Glucose-Capillary: 204 mg/dL — ABNORMAL HIGH (ref 70–99)
Glucose-Capillary: 120 mg/dL — ABNORMAL HIGH (ref 70–99)

## 2023-02-19 MED ORDER — INSULIN ASPART 100 UNIT/ML IJ SOLN
2.0000 [IU] | Freq: Three times a day (TID) | INTRAMUSCULAR | Status: DC
  Administered 2023-02-20 – 2023-02-21 (×4): 2 [IU] via SUBCUTANEOUS
  Filled 2023-02-19 (×4): qty 1

## 2023-02-19 NOTE — TOC Progression Note (Signed)
Transition of Care [facility]) - Progression Note    Patient Details  Name: Walter Harris MRN: [id number] Date of Birth: 1945-12-20  Transition of Care [facility]) CM/SW Contact  Truddie Hidden, RN Phone Number: 02/19/2023, 4:03 PM  Clinical Narrative:    Attempt to contact Tiffany at [facility]. No answer. Left a message regarding bed offer.     Expected Discharge Plan: Skilled Nursing Facility Barriers to Discharge: Continued Medical Work up  Expected Discharge Plan and Services       Living arrangements for the past 2 months: Single Family Home                                       Social Determinants of Health (SDOH) Interventions SDOH Screenings   Food Insecurity: No Food Insecurity (11/01/2022)  Housing: High Risk (02/13/2023)  Transportation Needs: Patient Unable To Answer (02/13/2023)  Utilities: Patient Unable To Answer (02/13/2023)  Alcohol Screen: Low Risk  (10/31/2021)  Depression (PHQ2-9): Low Risk  (11/01/2022)  Financial Resource Strain: Low Risk  (11/01/2022)  Physical Activity: Inactive (11/01/2022)  Social Connections: Socially Isolated (11/01/2022)  Stress: No Stress Concern Present (11/01/2022)  Tobacco Use: Medium Risk (02/13/2023)    Readmission Risk Interventions     No data to display

## 2023-02-19 NOTE — Inpatient Diabetes Management (Signed)
Inpatient Diabetes Program Recommendations  AACE/ADA: New Consensus Statement on Inpatient Glycemic Control   Target Ranges:  Prepandial:   less than 140 mg/dL      Peak postprandial:   less than 180 mg/dL (1-2 hours)      Critically ill patients:  140 - 180 mg/dL    Latest Reference Range & Units 02/18/23 07:50 02/18/23 13:05 02/18/23 17:21 02/18/23 17:25 02/18/23 21:03 02/19/23 09:38 02/19/23 12:54  Glucose-Capillary 70 - 99 mg/dL 045 (H) 409 (H) 811 (H) 228 (H) 139 (H) 118 (H) 290 (H)   Review of Glycemic Control  Diabetes history: DM2 Outpatient Diabetes medications: Lantus 20 units QHS, Metformin XR 500 mg TID, Glipizide 10 mg QAM Current orders for Inpatient glycemic control: Semglee 20 units QHS, Novolog 0-9 units TID with meals, Novolog 0-5 units QHS  Inpatient Diabetes Program Recommendations:    Insulin: May want to consider ordering Novolog 2 units TID with meals for meal coverage if patient eats at least 50% of meals.  Thanks, Orlando Penner, RN, MSN, CDCES Diabetes Coordinator Inpatient Diabetes Program [phone] (Team Pager from 8am to 5pm)

## 2023-02-19 NOTE — Progress Notes (Signed)
Progress Note   Patient: Walter Harris:[id number] DOB: 02-Mar-1946 DOA: 02/12/2023     7 DOS: the patient was seen and examined on 02/19/2023   Brief hospital course: Ms. Larnell Granlund is a 77 year old male with history of prior CVA with baseline nonverbal status, insulin-dependent diabetes mellitus, hypertension, hyperlipidemia, who presents emergency department for chief concerns of generalized weakness.   Vitals in the ED showed temperature of 98.2, respiration rate of 21, heart rate of 105, blood pressure 164/95, SpO2 95% on room air.   Serum sodium is 137, potassium 5.4, chloride 109, bicarb 17, BUN 63, serum creatinine is 1.79, EGFR 39, nonfasting blood glucose 278, WBC 12.7, hemoglobin 12.8, platelets of 298.   Lactic acid 2.2. UA was negative for leukocytes and nitrites. Blood cultures x 2 have been collected and are in process.   ED treatment: Sodium chloride 1 L bolus   TRH consulted for admission and further management as below.  6/10: Remained hemodynamically stable.  Awaiting SNF placement.  Sepsis ruled out as there was no obvious source of infection.  6/11: Medically stable-awaiting SNF placement. 6/12: Hemodynamically stable.  Mildly elevated postprandial CBG-adding 2 units of NovoLog with meals.  Still awaiting placement  Assessment and Plan: Severe sepsis [facility]) Patient had a high heart rate, respiration rate, leukocytosis of 12.7, elevated lactic acid of 2.2, organ involvement is renal Blood cultures x 2 negative Procalcitonin 0.1 low S/p Vancomycin, cefepime and metronidazole, S/p NS bolus  Continue Tele Vital signs stable, sepsis resolved.  No source of infection.  Discontinued antibiotics on 6/7 Monitor off antibiotics for now-sepsis ruled out     Altered mental status Resolved.  Now at baseline.   Nonverbal At baseline, secondary to history of CVA   Metabolic acidosis, CO2 17--22 improved S/p bicarbonate oral   AKI most likely due to dehydration,  Resolved after IVF  Renal functions and urine output daily Continue IV fluid for hydration   Hyperkalemia, Resolved  Presumed secondary to acute kidney injury Status post sepsis bolus ordered Post sepsis ordered sodium chloride infusion at 125 mL/h, 1 day ordered   Hypophosphatemia due to nutritional deficiency.  Phos repleted. Monitor electrolytes and replete as needed.   Elevated troponin, trending down, most likely demand ischemia Continue tele monitor   Pressure injury of skin Present on admission Wound care consulted   History of CVA in adulthood Aspirin 81 mg daily resumed   Insulin dependent type 2 diabetes mellitus (HCC) Insulin SSI with at bedtime coverage Goal inpatient blood glucose levels 140-180 Adding 2 unit of NovoLog with meals if eats more than 50% of meals   Essential hypertension Resumed propranolol and irbesartan home dose Hydralazine 5 mg IV every 8 hours as needed for SBP greater than 175, 5 days ordered   # Generalized weakness could be secondary to vitamin B12, folate and vitamin D deficiency. # Vitamin D deficiency: started vitamin D 50,000 units p.o. weekly, follow with PCP to repeat vitamin D level after 3 to 6 months. # Folic acid deficiency, folate level 3.5, started folic acid 1 mg p.o. daily # Vitamin B12 deficiency: B12 level 147, Goal >400, Started vitamin B12 1000 mcg IM injection daily during hospital stay, followed by oral supplement.  Follow-up PCP to repeat vitamin B12 level after 3 to 6 months.       Subjective: .  Patient was sitting comfortably in chair when seen today.  No new concern.  Wife at bedside  Physical Exam: Vitals:   02/19/23 8119  02/19/23 0942 02/19/23 1249 02/19/23 1250  BP: (!) 161/90 114/89 94/74 (!) 118/96  Pulse: 96  92   Resp: 18  18   Temp: (!) 97.5 F (36.4 C)  98 F (36.7 C)   TempSrc: Axillary  Axillary   SpO2: 99%  97%   Weight:      Height:      Physical Exam:  General.  Frail elderly man, in no  acute distress. Pulmonary.  Lungs clear bilaterally, normal respiratory effort. CV.  Regular rate and rhythm, no JVD, rub or murmur. Abdomen.  Soft, nontender, nondistended, BS positive. CNS.  Alert, nonverbal, no apparent neurodeficit Extremities.  No edema, no cyanosis, pulses intact and symmetrical. Psychiatry.  Appears to have some cognitive impairment  Data Reviewed: Prior data reviewed  Family Communication: Discussed with wife at bedside  Disposition: Status is: Inpatient Remains inpatient appropriate because: Severity of illness.  Now medically stable  Planned Discharge Destination: Skilled nursing facility  DVT prophylaxis.  Eliquis Time spent: 37 minutes  This record has been created using Conservation officer, historic buildings. Errors have been sought and corrected,but may not always be located. Such creation errors do not reflect on the standard of care.   Author: Arnetha Courser, MD 02/19/2023 4:39 PM  For on call review [email or web address].

## 2023-02-19 NOTE — Progress Notes (Signed)
Physical Therapy Treatment Patient Details Name: Walter Harris MRN: [id number] DOB: 19-Apr-1946 Today's Date: 02/19/2023   History of Present Illness 77 y/o male presented to ED on 02/12/23 for generalized weakness and code sepsis. PMH: CVA with baseline, DM, HTN    PT Comments    Pt with flat affect t/o session, but did show inconsistent moments of interaction, able to more consistently follow cues for basic supine LE exercises; however overall weak and functionally limited t/o.  L LE with more AROM strength than L, but did not show great control with AROM in general, with non-functional/decreased available ROM in b/l ankles.  Pt's awake t/o session, but often needing repeated cues and AAROM to facilitate LE exercises.  Pt will benefit from continued PT to address functional limitations. Continue POC.  Recommendations for follow up therapy are one component of a multi-disciplinary discharge planning process, led by the attending physician.  Recommendations may be updated based on patient status, additional functional criteria and insurance authorization.  Follow Up Recommendations  Can patient physically be transported by private vehicle: No    Assistance Recommended at Discharge Frequent or constant Supervision/Assistance  Patient can return home with the following A lot of help with walking and/or transfers;A lot of help with bathing/dressing/bathroom;Assistance with cooking/housework;Direct supervision/assist for medications management;Direct supervision/assist for financial management;Assist for transportation;Help with stairs or ramp for entrance   Equipment Recommendations   (TBD at next venue)    Recommendations for Other Services       Precautions / Restrictions Precautions Precautions: Fall Restrictions Weight Bearing Restrictions: No     Mobility  Bed Mobility               General bed mobility comments: Pt had been up in the chair earlier today, transferred back to  bed prior to session, indicates he does not wish to try getting up again today    Transfers                        Ambulation/Gait                   Stairs             Wheelchair Mobility    Modified Rankin (Stroke Patients Only)       Balance                                            Cognition Arousal/Alertness: Awake/alert Behavior During Therapy: Flat affect Overall Cognitive Status: No family/caregiver present to determine baseline cognitive functioning                                          Exercises General Exercises - Lower Extremity Ankle Circles/Pumps: PROM, 10 reps, AAROM (pt with limited available ankle ROM, R more limited than L.) Quad Sets: AROM, AAROM, 10 reps (lacks consistent quad control, but able to engage intermittently with much cuing) Heel Slides: AAROM, 10 reps, AROM (AROM with resisted leg ext on L, AAROM on R with inconsistent AROM) Hip ABduction/ADduction: AROM, AAROM, 10 reps (AROM/light resistance on L, AAROM on R with inconsistent AROM)    General Comments General comments (skin integrity, edema, etc.): Pt with flat affect much of the time, able to intermittently  follow cues for for exercises, but  inconsistent      Pertinent Vitals/Pain Pain Assessment Pain Assessment: Faces Facial Expression: smiling or inexpressive    Home Living                          Prior Function            PT Goals (current goals can now be found in the care plan section) Progress towards PT goals: Progressing toward goals    Frequency    Min 2X/week      PT Plan Current plan remains appropriate    Co-evaluation              AM-PAC PT "6 Clicks" Mobility   Outcome Measure  Help needed turning from your back to your side while in a flat bed without using bedrails?: A Lot Help needed moving from lying on your back to sitting on the side of a flat bed without using  bedrails?: A Lot Help needed moving to and from a bed to a chair (including a wheelchair)?: Total Help needed standing up from a chair using your arms (e.g., wheelchair or bedside chair)?: Total Help needed to walk in hospital room?: Total Help needed climbing 3-5 steps with a railing? : Total 6 Click Score: 8    End of Session Equipment Utilized During Treatment: Gait belt Activity Tolerance: Patient limited by fatigue Patient left: in bed;with call bell/phone within reach;with bed alarm set;with nursing/sitter in room;Other (comment)   PT Visit Diagnosis: Unsteadiness on feet (R26.81);Muscle weakness (generalized) (M62.81);Other abnormalities of gait and mobility (R26.89)     Time: 1610-9604 PT Time Calculation (min) (ACUTE ONLY): 13 min  Charges:  $Therapeutic Exercise: 8-22 mins                     Malachi Pro, DPT 02/19/2023, 5:05 PM

## 2023-02-20 DIAGNOSIS — E1142 Type 2 diabetes mellitus with diabetic polyneuropathy: Secondary | ICD-10-CM | POA: Diagnosis not present

## 2023-02-20 DIAGNOSIS — R410 Disorientation, unspecified: Secondary | ICD-10-CM | POA: Diagnosis not present

## 2023-02-20 DIAGNOSIS — A419 Sepsis, unspecified organism: Secondary | ICD-10-CM | POA: Diagnosis not present

## 2023-02-20 DIAGNOSIS — R531 Weakness: Secondary | ICD-10-CM | POA: Diagnosis not present

## 2023-02-20 LAB — GLUCOSE, CAPILLARY
Glucose-Capillary: 100 mg/dL — ABNORMAL HIGH (ref 70–99)
Glucose-Capillary: 334 mg/dL — ABNORMAL HIGH (ref 70–99)
Glucose-Capillary: 287 mg/dL — ABNORMAL HIGH (ref 70–99)
Glucose-Capillary: 222 mg/dL — ABNORMAL HIGH (ref 70–99)

## 2023-02-20 NOTE — Progress Notes (Addendum)
Mobility Specialist - Progress Note:     02/20/23 1100  Mobility  Activity Stood at bedside;Transferred from bed to chair  Level of Assistance +2 (takes two people)  Assistive Device [employer]/Exercises Active  Activity Response Tolerated well  Mobility Referral Yes  $Mobility charge 1 Mobility  Mobility Specialist Start Time (ACUTE ONLY) 1046  Mobility Specialist Stop Time (ACUTE ONLY) 1102  Mobility Specialist Time Calculation (min) (ACUTE ONLY) 16 min   Pt resting in bed on RA upon entry. Pt STS (assisted) with sabrina steady lift and transferred to recliner +2. Pt was more engaged during this session and tried to pull strap to other side to assist author and stood up partially before sabrina steady fully raised. Pt left in recliner with needs in reach and chair alarm activated.   Johnathan Hausen Mobility Specialist 02/20/23, 11:10 AM

## 2023-02-20 NOTE — Care Management Important Message (Signed)
Important Message  Patient Details  Name: Walter Harris MRN: [id number] Date of Birth: March 05, 1946   Medicare Important Message Given:  Yes     Johnell Comings 02/20/2023, 1:54 PM

## 2023-02-20 NOTE — Progress Notes (Signed)
Progress Note   Patient: Walter Harris:[id number] DOB: Feb 12, 1946 DOA: 02/12/2023     8 DOS: the patient was seen and examined on 02/20/2023   Brief hospital course: [name] is a 76 year old male with history of prior CVA with baseline nonverbal status, insulin-dependent diabetes mellitus, hypertension, hyperlipidemia, who presents emergency department for chief concerns of generalized weakness.   Vitals in the ED showed temperature of 98.2, respiration rate of 21, heart rate of 105, blood pressure 164/95, SpO2 95% on room air.   Serum sodium is 137, potassium 5.4, chloride 109, bicarb 17, BUN 63, serum creatinine is 1.79, EGFR 39, nonfasting blood glucose 278, WBC 12.7, hemoglobin 12.8, platelets of 298.   Lactic acid 2.2. UA was negative for leukocytes and nitrites. Blood cultures x 2 have been collected and are in process.   ED treatment: Sodium chloride 1 L bolus   TRH consulted for admission and further management as below.  6/10: Remained hemodynamically stable.  Awaiting SNF placement.  Sepsis ruled out as there was no obvious source of infection.  6/11: Medically stable-awaiting SNF placement. 6/12: Hemodynamically stable.  Mildly elevated postprandial CBG-adding 2 units of NovoLog with meals.  Still awaiting placement. 6/13: Medically stable.  Now had a bed offer-pending insurance authorization  Assessment and Plan: Severe sepsis [facility]) Patient had a high heart rate, respiration rate, leukocytosis of 12.7, elevated lactic acid of 2.2, organ involvement is renal Blood cultures x 2 negative Procalcitonin 0.1 low S/p Vancomycin, cefepime and metronidazole, S/p NS bolus  Continue Tele Vital signs stable, sepsis resolved.  No source of infection.  Discontinued antibiotics on 6/7 Monitor off antibiotics for now-sepsis ruled out     Altered mental status Resolved.  Now at baseline.   Nonverbal At baseline, secondary to history of CVA   Metabolic acidosis, CO2  17--22 improved S/p bicarbonate oral   AKI most likely due to dehydration, Resolved after IVF  Renal functions and urine output daily Continue IV fluid for hydration   Hyperkalemia, Resolved  Presumed secondary to acute kidney injury Status post sepsis bolus ordered Post sepsis ordered sodium chloride infusion at 125 mL/h, 1 day ordered   Hypophosphatemia due to nutritional deficiency.  Phos repleted. Monitor electrolytes and replete as needed.   Elevated troponin, trending down, most likely demand ischemia Continue tele monitor   Pressure injury of skin Present on admission Wound care consulted   History of CVA in adulthood Aspirin 81 mg daily resumed   Insulin dependent type 2 diabetes mellitus (HCC) Insulin SSI with at bedtime coverage Goal inpatient blood glucose levels 140-180 Adding 2 unit of NovoLog with meals if eats more than 50% of meals   Essential hypertension Resumed propranolol and irbesartan home dose Hydralazine 5 mg IV every 8 hours as needed for SBP greater than 175, 5 days ordered   # Generalized weakness could be secondary to vitamin B12, folate and vitamin D deficiency. # Vitamin D deficiency: started vitamin D 50,000 units p.o. weekly, follow with PCP to repeat vitamin D level after 3 to 6 months. # Folic acid deficiency, folate level 3.5, started folic acid 1 mg p.o. daily # Vitamin B12 deficiency: B12 level 147, Goal >400, Started vitamin B12 1000 mcg IM injection daily during hospital stay, followed by oral supplement.  Follow-up PCP to repeat vitamin B12 level after 3 to 6 months.       Subjective: Patient was sitting in chair when seen today.  No new concern.  Physical Exam: Vitals:  02/20/23 0329 02/20/23 0808 02/20/23 1213 02/20/23 1215  BP: 95/79 100/73 103/77   Pulse: 81 96 (!) 105   Resp: 16 16 16    Temp: 97.7 F (36.5 C) 98.9 F (37.2 C) (!) 97.3 F (36.3 C)   TempSrc: Oral Oral    SpO2: 97% 100% (!) 83% 100%  Weight:       Height:      Physical Exam:  General.  Nonverbal, frail elderly man, in no acute distress. Pulmonary.  Lungs clear bilaterally, normal respiratory effort. CV.  Regular rate and rhythm, no JVD, rub or murmur. Abdomen.  Soft, nontender, nondistended, BS positive. CNS.  Awake and nonverbal Extremities.  No edema, no cyanosis, pulses intact and symmetrical.  Data Reviewed: Prior data reviewed  Family Communication:   Disposition: Status is: Inpatient Remains inpatient appropriate because: Severity of illness.  Now medically stable  Planned Discharge Destination: Skilled nursing facility  DVT prophylaxis.  Eliquis Time spent: 38 minutes  This record has been created using Conservation officer, historic buildings. Errors have been sought and corrected,but may not always be located. Such creation errors do not reflect on the standard of care.   Author: Arnetha Courser, MD 02/20/2023 3:15 PM  For on call review [email or web address].

## 2023-02-20 NOTE — TOC Progression Note (Signed)
Transition of Care [facility]) - Progression Note    Patient Details  Name: Walter Harris MRN: [id number] Date of Birth: [birth year]  Transition of Care [facility]) CM/SW Contact  Truddie Hidden, RN Phone Number: 02/20/2023, 11:10 AM  Clinical Narrative:    Spoke with Tiffany from [facility]. Patient was extended a bed offer. Spoke with  patient's wife to advised of bed offer. Patient wife accepted offer.  She was advised insurance auth must be approved.  [employer] contacted. Spoke with Judeth Cornfield to start auth.    Expected Discharge Plan: Skilled Nursing Facility Barriers to Discharge: Continued Medical Work up  Expected Discharge Plan and Services       Living arrangements for the past 2 months: Single Family Home                                       Social Determinants of Health (SDOH) Interventions SDOH Screenings   Food Insecurity: No Food Insecurity (11/01/2022)  Housing: High Risk (02/13/2023)  Transportation Needs: Patient Unable To Answer (02/13/2023)  Utilities: Patient Unable To Answer (02/13/2023)  Alcohol Screen: Low Risk  (10/31/2021)  Depression (PHQ2-9): Low Risk  (11/01/2022)  Financial Resource Strain: Low Risk  (11/01/2022)  Physical Activity: Inactive (11/01/2022)  Social Connections: Socially Isolated (11/01/2022)  Stress: No Stress Concern Present (11/01/2022)  Tobacco Use: Medium Risk (02/13/2023)    Readmission Risk Interventions     No data to display

## 2023-02-21 DIAGNOSIS — R531 Weakness: Secondary | ICD-10-CM | POA: Diagnosis not present

## 2023-02-21 DIAGNOSIS — E1142 Type 2 diabetes mellitus with diabetic polyneuropathy: Secondary | ICD-10-CM | POA: Diagnosis not present

## 2023-02-21 DIAGNOSIS — R410 Disorientation, unspecified: Secondary | ICD-10-CM | POA: Diagnosis not present

## 2023-02-21 DIAGNOSIS — A419 Sepsis, unspecified organism: Secondary | ICD-10-CM | POA: Diagnosis not present

## 2023-02-21 LAB — GLUCOSE, CAPILLARY
Glucose-Capillary: 133 mg/dL — ABNORMAL HIGH (ref 70–99)
Glucose-Capillary: 149 mg/dL — ABNORMAL HIGH (ref 70–99)
Glucose-Capillary: 116 mg/dL — ABNORMAL HIGH (ref 70–99)
Glucose-Capillary: 78 mg/dL (ref 70–99)

## 2023-02-21 MED ORDER — INSULIN ASPART 100 UNIT/ML IJ SOLN
4.0000 [IU] | Freq: Three times a day (TID) | INTRAMUSCULAR | Status: DC
  Administered 2023-02-21 – 2023-02-24 (×7): 4 [IU] via SUBCUTANEOUS
  Filled 2023-02-21 (×7): qty 1

## 2023-02-21 NOTE — TOC Progression Note (Addendum)
Transition of Care [facility]) - Progression Note    Patient Details  Name: Walter Harris MRN: [id number] Date of Birth: July 30, 1946  Transition of [facility]) CM/SW Contact  Truddie Hidden, RN Phone Number: 02/21/2023, 1:36 PM  Clinical Narrative:    Peer to peer completed. Per MD patient is approved but must start process of obtaining a long term payor for potential LTC needs.  Attempt to reach Tiffany at [facility] for possible discharge today. No answer. Left a message.  MD notified. Attempt to reach wife to advise to start MCD application. No answer.  2:36pm Spoke with patient's wife to advised he has been approved for [facility]. She was informed patient will likely need LTC and will have to have a LTC payor. She was advised to apply for [place] [facility] via DSS.    2:49pm Retrieved call from [facility].  Spoke with  [employer]. Patient approved for SNF starting today for 10 days #[id number] Ambulance approved [phone]    Expected Discharge Plan: Skilled Nursing Facility Barriers to Discharge: Continued Medical Work up  Expected Discharge Plan and Services       Living arrangements for the past 2 months: Single Family Home                                       Social Determinants of Health (SDOH) Interventions SDOH Screenings   Food Insecurity: No Food Insecurity (11/01/2022)  Housing: High Risk (02/13/2023)  Transportation Needs: Patient Unable To Answer (02/13/2023)  Utilities: Patient Unable To Answer (02/13/2023)  Alcohol Screen: Low Risk  (10/31/2021)  Depression (PHQ2-9): Low Risk  (11/01/2022)  Financial Resource Strain: Low Risk  (11/01/2022)  Physical Activity: Inactive (11/01/2022)  Social Connections: Socially Isolated (11/01/2022)  Stress: No Stress Concern Present (11/01/2022)  Tobacco Use: Medium Risk (02/13/2023)    Readmission Risk Interventions     No data to display

## 2023-02-21 NOTE — TOC Progression Note (Signed)
Transition of Care [facility]) - Progression Note    Patient Details  Name: Walter Harris MRN: [id number] Date of Birth: [birth year]  Transition of Care [facility]) CM/SW Contact  Truddie Hidden, RN Phone Number: 02/21/2023, 10:04 AM  Clinical Narrative:    Retrieve message from Dexter at [facility]. Message stated Peer to Peer was being offered for SNF request. Patient is not meeting medical necessity at this time for SNF request. Peer to peer deadline is 02/22/2023 at noon. MD can Medical Director, Dr. Lovena Le @ [phone]. MD notified.    Expected Discharge Plan: Skilled Nursing Facility Barriers to Discharge: Continued Medical Work up  Expected Discharge Plan and Services       Living arrangements for the past 2 months: Single Family Home                                       Social Determinants of Health (SDOH) Interventions SDOH Screenings   Food Insecurity: No Food Insecurity (11/01/2022)  Housing: High Risk (02/13/2023)  Transportation Needs: Patient Unable To Answer (02/13/2023)  Utilities: Patient Unable To Answer (02/13/2023)  Alcohol Screen: Low Risk  (10/31/2021)  Depression (PHQ2-9): Low Risk  (11/01/2022)  Financial Resource Strain: Low Risk  (11/01/2022)  Physical Activity: Inactive (11/01/2022)  Social Connections: Socially Isolated (11/01/2022)  Stress: No Stress Concern Present (11/01/2022)  Tobacco Use: Medium Risk (02/13/2023)    Readmission Risk Interventions     No data to display

## 2023-02-21 NOTE — Progress Notes (Signed)
Occupational Therapy Treatment Patient Details Name: Walter Harris MRN: [id number] DOB: 07/28/1946 Today's Date: 02/21/2023   History of present illness 77 y/o male presented to ED on 02/12/23 for generalized weakness and code sepsis. PMH: CVA with baseline, DM, HTN   OT comments  Pt received semi-reclined in bed. Appearing alert; willing to work with OT on grooming and t/f to chair. T/f to EOB with MAX A x2; t/f HHA MAX A-total A x2 to recliner chair. See flowsheet below for further details of session. Left in recliner with chair alarm on, BIL LE elevated, with all needs in reach.  Patient will benefit from continued OT while in acute care.    Recommendations for follow up therapy are one component of a multi-disciplinary discharge planning process, led by the attending physician.  Recommendations may be updated based on patient status, additional functional criteria and insurance authorization.    Assistance Recommended at Discharge Frequent or constant Supervision/Assistance  Patient can return home with the following  Two people to help with walking and/or transfers;A lot of help with bathing/dressing/bathroom;Assistance with cooking/housework;Direct supervision/assist for medications management;Direct supervision/assist for financial management;Assist for transportation;Help with stairs or ramp for entrance   Equipment Recommendations  BSC/3in1;Hospital bed    Recommendations for Other Services      Precautions / Restrictions Precautions Precautions: Fall Restrictions Weight Bearing Restrictions: No       Mobility Bed Mobility Overal bed mobility: Needs Assistance Bed Mobility: Supine to Sit     Supine to sit: +2 for physical assistance, +2 for safety/equipment, Max assist Sit to supine: Max assist, +2 for physical assistance   General bed mobility comments: maxA+2 to complete all aspects of bed mobility    Transfers Overall transfer level: Needs assistance Equipment  used: 2 person hand held assist Transfers: Sit to/from Stand, Bed to chair/wheelchair/BSC Sit to Stand: Mod assist, +2 safety/equipment Stand pivot transfers: Max assist, +2 physical assistance, +2 safety/equipment         General transfer comment: attempted step pivot transfer but unable to advance either LE without totalA. Resulted in stand pivot with maxA+2     Balance Overall balance assessment: Needs assistance Sitting-balance support: Bilateral upper extremity supported, Feet supported Sitting balance-Leahy Scale: Fair Sitting balance - Comments: without UE support, pt leaning backwards slowly and needing OT physical assist to correct   Standing balance support: Bilateral upper extremity supported, During functional activity Standing balance-Leahy Scale: Poor                             ADL either performed or assessed with clinical judgement   ADL Overall ADL's : Needs assistance/impaired     Grooming: Wash/dry face;Set up;Supervision/safety;Oral care;Moderate assistance Grooming Details (indicate cue type and reason): Pt able to wash face at EOB with set up and supervision; OT intermittently having to assist pt with sitting balance due to needing hands on bed for support. Pt able to use toothbrush with set up, but requiring MOD A with hand-under-hand assistance for thoroughness (seated in recliner); pt did not rinse and spit after brushing, rather just swallowed the water that was given for rinsing.                                    Extremity/Trunk Assessment Upper Extremity Assessment Upper Extremity Assessment: Generalized weakness   Lower Extremity Assessment Lower Extremity Assessment: Defer to  PT evaluation        Vision       Perception     Praxis      Cognition Arousal/Alertness: Awake/alert Behavior During Therapy: Flat affect Overall Cognitive Status: No family/caregiver present to determine baseline cognitive functioning                                  General Comments: intermittently nods head yes/no. Only made a grunting sound during session once; otherwise non-verbal        Exercises      Shoulder Instructions       General Comments Flat affect, intermittent cue following, intermittent yes/no    Pertinent Vitals/ Pain       Pain Assessment Pain Assessment: Faces Faces Pain Scale: Hurts a little bit (pt grimacing upon sitting in chair) Pain Location: R hand (IV site) (pt looking at that site when wrist moved; two IVs in place) Pain Descriptors / Indicators: Grimacing Pain Intervention(s): Monitored during session, Limited activity within patient's tolerance  Home Living                                          Prior Functioning/Environment              Frequency  Min 2X/week        Progress Toward Goals  OT Goals(current goals can now be found in the care plan section)  Progress towards OT goals: Progressing toward goals  Acute Rehab OT Goals Patient Stated Goal: did not state OT Goal Formulation: With patient Time For Goal Achievement: 02/27/23 Potential to Achieve Goals: Fair ADL Goals Pt Will Perform Grooming: with set-up;with supervision;sitting Pt Will Perform Lower Body Dressing: sit to/from stand;with min assist;with caregiver independent in assisting Pt Will Transfer to Toilet: with min guard assist;stand pivot transfer;bedside commode  Plan Discharge plan remains appropriate;Frequency remains appropriate    Co-evaluation    PT/OT/SLP Co-Evaluation/Treatment: Yes Reason for Co-Treatment: For patient/therapist safety;To address functional/ADL transfers PT goals addressed during session: Mobility/safety with mobility OT goals addressed during session: ADL's and self-care      AM-PAC OT "6 Clicks" Daily Activity     Outcome Measure   Help from another person eating meals?: A Little Help from another person taking care of  personal grooming?: A Little Help from another person toileting, which includes using toliet, bedpan, or urinal?: A Lot Help from another person bathing (including washing, rinsing, drying)?: A Lot Help from another person to put on and taking off regular upper body clothing?: A Lot Help from another person to put on and taking off regular lower body clothing?: Total 6 Click Score: 13    End of Session    OT Visit Diagnosis: Unsteadiness on feet (R26.81);Muscle weakness (generalized) (M62.81)   Activity Tolerance     Patient Left     Nurse Communication          Time: 1006-1030 OT Time Calculation (min): 24 min  Charges: OT General Charges $OT Visit: 1 Visit OT Treatments $Self Care/Home Management : 8-22 mins  Linward Foster, MS, OTR/L   Alvester Morin 02/21/2023, 1:03 PM

## 2023-02-21 NOTE — Progress Notes (Signed)
Physical Therapy Treatment Patient Details Name: Walter Harris MRN: [id number] DOB: May 17, 1946 Today's Date: 02/21/2023   History of Present Illness 77 y/o male presented to ED on 02/12/23 for generalized weakness and code sepsis. PMH: CVA with baseline, DM, HTN    PT Comments    Patient in bed on arrival and agreeable to therapy session. Minimal verbalizations this session but nodding head yes/no at times. Required maxA+2 for bed mobility and modA+2 to stand from EOB. Attempted step pivot transfer but unable to advance R LE without totalA. Resulted in stand pivot transfer with maxA+2 and HHAx2. Left in chair with OT present and chair alarm activated. Discharge plan remains appropriate.     Recommendations for follow up therapy are one component of a multi-disciplinary discharge planning process, led by the attending physician.  Recommendations may be updated based on patient status, additional functional criteria and insurance authorization.  Follow Up Recommendations  Can patient physically be transported by private vehicle: No    Assistance Recommended at Discharge Frequent or constant Supervision/Assistance  Patient can return home with the following A lot of help with walking and/or transfers;A lot of help with bathing/dressing/bathroom;Assistance with cooking/housework;Direct supervision/assist for medications management;Direct supervision/assist for financial management;Assist for transportation;Help with stairs or ramp for entrance   Equipment Recommendations  Other (comment) (TBD at next venue of care)    Recommendations for Other Services       Precautions / Restrictions Precautions Precautions: Fall Restrictions Weight Bearing Restrictions: No     Mobility  Bed Mobility Overal bed mobility: Needs Assistance Bed Mobility: Supine to Sit     Supine to sit: +2 for physical assistance, +2 for safety/equipment, Max assist     General bed mobility comments: maxA+2 to  complete all aspects of bed mobility    Transfers Overall transfer level: Needs assistance Equipment used: 2 person hand held assist Transfers: Sit to/from Stand, Bed to chair/wheelchair/BSC Sit to Stand: Mod assist, +2 safety/equipment Stand pivot transfers: Max assist, +2 physical assistance, +2 safety/equipment         General transfer comment: attempted step pivot transfer but unable to advance R LE without totalA. Resulted in stand pivot with maxA+2    Ambulation/Gait                   Stairs             Wheelchair Mobility    Modified Rankin (Stroke Patients Only)       Balance Overall balance assessment: Needs assistance Sitting-balance support: Bilateral upper extremity supported, Feet supported Sitting balance-Leahy Scale: Fair     Standing balance support: Bilateral upper extremity supported, During functional activity Standing balance-Leahy Scale: Poor                              Cognition Arousal/Alertness: Awake/alert Behavior During Therapy: Flat affect Overall Cognitive Status: No family/caregiver present to determine baseline cognitive functioning                                 General Comments: Very minimal verbalizations during session, nods head to intermittent yes/no questions        Exercises      General Comments        Pertinent Vitals/Pain Pain Assessment Pain Assessment: Faces Faces Pain Scale: Hurts a little bit Pain Location: R hand (IV site) Pain Descriptors /  Indicators: Grimacing Pain Intervention(s): Monitored during session, Repositioned    Home Living                          Prior Function            PT Goals (current goals can now be found in the care plan section) Acute Rehab PT Goals Patient Stated Goal: did not state PT Goal Formulation: Patient unable to participate in goal setting Time For Goal Achievement: 02/27/23 Potential to Achieve Goals:  Fair Progress towards PT goals: Progressing toward goals    Frequency    Min 2X/week      PT Plan Current plan remains appropriate    Co-evaluation PT/OT/SLP Co-Evaluation/Treatment: Yes Reason for Co-Treatment: For patient/therapist safety;To address functional/ADL transfers PT goals addressed during session: Mobility/safety with mobility        AM-PAC PT "6 Clicks" Mobility   Outcome Measure  Help needed turning from your back to your side while in a flat bed without using bedrails?: A Lot Help needed moving from lying on your back to sitting on the side of a flat bed without using bedrails?: Total Help needed moving to and from a bed to a chair (including a wheelchair)?: Total Help needed standing up from a chair using your arms (e.g., wheelchair or bedside chair)?: Total Help needed to walk in hospital room?: Total Help needed climbing 3-5 steps with a railing? : Total 6 Click Score: 7    End of Session Equipment Utilized During Treatment: Gait belt Activity Tolerance: Patient limited by fatigue Patient left: with call bell/phone within reach;in chair;with chair alarm set Nurse Communication: Mobility status;Need for lift equipment PT Visit Diagnosis: Unsteadiness on feet (R26.81);Muscle weakness (generalized) (M62.81);Other abnormalities of gait and mobility (R26.89)     Time: 2130-8657 PT Time Calculation (min) (ACUTE ONLY): 18 min  Charges:  $Therapeutic Activity: 8-22 mins                     Maylon Peppers, PT, DPT Physical Therapist - [facility]  [facility]    Walter Harris 02/21/2023, 1:00 PM

## 2023-02-21 NOTE — Inpatient Diabetes Management (Signed)
Inpatient Diabetes Program Recommendations  AACE/ADA: New Consensus Statement on Inpatient Glycemic Control (2015)  Target Ranges:  Prepandial:   less than 140 mg/dL      Peak postprandial:   less than 180 mg/dL (1-2 hours)      Critically ill patients:  140 - 180 mg/dL    Latest Reference Range & Units 02/20/23 08:15 02/20/23 12:03 02/20/23 16:08 02/20/23 20:08  Glucose-Capillary 70 - 99 mg/dL 161 (H)  2 units Novolog  334 (H)  9 units Novolog  287 (H)  7 units Novolog  222 (H)  2 units Novolog  20 units Semglee  (H): Data is abnormally high  Latest Reference Range & Units 02/21/23 08:04  Glucose-Capillary 70 - 99 mg/dL 096 (H)  (H): Data is abnormally high     Home DM Meds: Glipizide 10 mg daily        Lantus 20 units QHS        Metformin 500 mg TID   Current Orders: Semglee 20 units QHS      Novolog Sensitive Correction Scale/ SSI (0-9 units) TID AC + HS      Novolog 2 units TID with meals    MD- Note Novolog 2 units TID for meal coverage started yest AM  Please consider increasing the Novolog Meal Coverage to 4 units TID with meals    --Will follow patient during hospitalization--  Ambrose Finland RN, MSN, CDCES Diabetes Coordinator Inpatient Glycemic Control Team Team Pager: [phone] (8a-5p)

## 2023-02-21 NOTE — Progress Notes (Signed)
Progress Note   Patient: Walter Harris ZOX:[id number] DOB: [birth year] DOA: 02/12/2023     9 DOS: the patient was seen and examined on 02/21/2023   Brief hospital course: Ms. Virl Donson is a 77 year old male with history of prior CVA with baseline nonverbal status, insulin-dependent diabetes mellitus, hypertension, hyperlipidemia, who presents emergency department for chief concerns of generalized weakness.   Vitals in the ED showed temperature of 98.2, respiration rate of 21, heart rate of 105, blood pressure 164/95, SpO2 95% on room air.   Serum sodium is 137, potassium 5.4, chloride 109, bicarb 17, BUN 63, serum creatinine is 1.79, EGFR 39, nonfasting blood glucose 278, WBC 12.7, hemoglobin 12.8, platelets of 298.   Lactic acid 2.2. UA was negative for leukocytes and nitrites. Blood cultures x 2 have been collected and are in process.   ED treatment: Sodium chloride 1 L bolus   TRH consulted for admission and further management as below.  6/10: Remained hemodynamically stable.  Awaiting SNF placement.  6/14: Patient got approved after peer to peer with Dr. Logan Bores, patient most likely will need long-term placement-5 was advised to work on getting him Medicaid so he can go for long-term care.  Unfortunately facility did not responded back and likely he will stay until Monday.  Sepsis ruled out as there was no obvious source of infection.  6/11: Medically stable-awaiting SNF placement. 6/12: Hemodynamically stable.  Mildly elevated postprandial CBG-adding 2 units of NovoLog with meals.  Still awaiting placement. 6/13: Medically stable.  Now had a bed offer-pending insurance authorization  Assessment and Plan: Severe sepsis [facility]) Patient had a high heart rate, respiration rate, leukocytosis of 12.7, elevated lactic acid of 2.2, organ involvement is renal Blood cultures x 2 negative Procalcitonin 0.1 low S/p Vancomycin, cefepime and metronidazole, S/p NS bolus  Continue Tele Vital  signs stable, sepsis resolved.  No source of infection.  Discontinued antibiotics on 6/7 Monitor off antibiotics for now-sepsis ruled out     Altered mental status Resolved.  Now at baseline.   Nonverbal At baseline, secondary to history of CVA   Metabolic acidosis, CO2 17--22 improved S/p bicarbonate oral   AKI most likely due to dehydration, Resolved after IVF  Renal functions and urine output daily Continue IV fluid for hydration   Hyperkalemia, Resolved  Presumed secondary to acute kidney injury Status post sepsis bolus ordered Post sepsis ordered sodium chloride infusion at 125 mL/h, 1 day ordered   Hypophosphatemia due to nutritional deficiency.  Phos repleted. Monitor electrolytes and replete as needed.   Elevated troponin, trending down, most likely demand ischemia Continue tele monitor   Pressure injury of skin Present on admission Wound care consulted   History of CVA in adulthood Aspirin 81 mg daily resumed   Insulin dependent type 2 diabetes mellitus (HCC) Insulin SSI with at bedtime coverage Goal inpatient blood glucose levels 140-180 Adding 2 unit of NovoLog with meals if eats more than 50% of meals   Essential hypertension Resumed propranolol and irbesartan home dose Hydralazine 5 mg IV every 8 hours as needed for SBP greater than 175, 5 days ordered   # Generalized weakness could be secondary to vitamin B12, folate and vitamin D deficiency. # Vitamin D deficiency: started vitamin D 50,000 units p.o. weekly, follow with PCP to repeat vitamin D level after 3 to 6 months. # Folic acid deficiency, folate level 3.5, started folic acid 1 mg p.o. daily # Vitamin B12 deficiency: B12 level 147, Goal >400, Started vitamin B12  1000 mcg IM injection daily during hospital stay, followed by oral supplement.  Follow-up PCP to repeat vitamin B12 level after 3 to 6 months.       Subjective: Patient was seen and examined today.  No new concern.  Wife at  bedside  Physical Exam: Vitals:   02/21/23 0423 02/21/23 0803 02/21/23 0824 02/21/23 1228  BP: 122/69 (!) 72/47 106/66 (!) 88/71  Pulse: 97 94  98  Resp: 14 16  16   Temp: 98.2 F (36.8 C) 98.2 F (36.8 C)  98.1 F (36.7 C)  TempSrc:      SpO2: 94% 95%  100%  Weight:      Height:      Physical Exam:  General.  Frail and nonverbal elderly man, in no acute distress. Pulmonary.  Lungs clear bilaterally, normal respiratory effort. CV.  Regular rate and rhythm, no JVD, rub or murmur. Abdomen.  Soft, nontender, nondistended, BS positive. CNS.  Alert and nonverbal Extremities.  No edema, no cyanosis, pulses intact and symmetrical.  Data Reviewed: Prior data reviewed  Family Communication: Discussed with wife at bedside  Disposition: Status is: Inpatient Remains inpatient appropriate because: Severity of illness.  Now medically stable  Planned Discharge Destination: Skilled nursing facility  DVT prophylaxis.  Eliquis Time spent: 37 minutes  This record has been created using Conservation officer, historic buildings. Errors have been sought and corrected,but may not always be located. Such creation errors do not reflect on the standard of care.   Author: Arnetha Courser, MD 02/21/2023 3:58 PM  For on call review [email or web address].

## 2023-02-21 NOTE — Consult Note (Signed)
[facility] Customer service manager [facility]) Accountable Care Organization (ACO) [facility] Liaison Note  02/21/2023  Walter Harris May 08, 1946 [id number]  Location: [facility] RN Hospital Liaison screened the patient remotely at [facility].  Insurance: [employer]   Walter Harris is a 77 y.o. male who is a Primary Care Patient of Para March, Dwana Curd, MD [facility]). The patient was screened for  readmission hospitalization with noted medium risk score for unplanned readmission risk with 1 IP  in 6 months.  The patient was assessed for potential [facility]) Care Management service needs for post hospital transition for care coordination. Review of patient's electronic medical record reveals patient was admitted with Sepsis/weakness. Pt will be discharged to STR at SNF level of care [facility]).  SNF will address pt's ongoing care management needs.   Plan: [facility] Liaison will continue to follow progress and disposition to asess for post hospital community care coordination/management needs.  Referral request for community care coordination: pending disposition.   [facility] Care Management/Population Health does not replace or interfere with any arrangements made by the Inpatient Transition of Care team.   For questions contact:   Elliot Cousin, RN, BSN [facility] Liaison [place]   [facility]  Population Health Office Hours MTWF  8:00 am-6:00 pm Off on Thursday [phone] mobile [phone] [Office toll free line][facility] Office Hours are M-F 8:30 - 5 pm 24 hour nurse advise line [phone] Concierge  Walter Harris

## 2023-02-22 DIAGNOSIS — R531 Weakness: Secondary | ICD-10-CM | POA: Diagnosis not present

## 2023-02-22 DIAGNOSIS — A419 Sepsis, unspecified organism: Secondary | ICD-10-CM | POA: Diagnosis not present

## 2023-02-22 DIAGNOSIS — R410 Disorientation, unspecified: Secondary | ICD-10-CM | POA: Diagnosis not present

## 2023-02-22 DIAGNOSIS — E1142 Type 2 diabetes mellitus with diabetic polyneuropathy: Secondary | ICD-10-CM | POA: Diagnosis not present

## 2023-02-22 LAB — GLUCOSE, CAPILLARY
Glucose-Capillary: 216 mg/dL — ABNORMAL HIGH (ref 70–99)
Glucose-Capillary: 252 mg/dL — ABNORMAL HIGH (ref 70–99)
Glucose-Capillary: 226 mg/dL — ABNORMAL HIGH (ref 70–99)
Glucose-Capillary: 96 mg/dL (ref 70–99)

## 2023-02-22 NOTE — Progress Notes (Signed)
Progress Note   Patient: DARIYON BARINGER QMV:[id number] DOB: 04/13/1946 DOA: 02/12/2023     10 DOS: the patient was seen and examined on 02/22/2023   Brief hospital course: Ms. Rei Haberkorn is a 77 year old male with history of prior CVA with baseline nonverbal status, insulin-dependent diabetes mellitus, hypertension, hyperlipidemia, who presents emergency department for chief concerns of generalized weakness.   Vitals in the ED showed temperature of 98.2, respiration rate of 21, heart rate of 105, blood pressure 164/95, SpO2 95% on room air.   Serum sodium is 137, potassium 5.4, chloride 109, bicarb 17, BUN 63, serum creatinine is 1.79, EGFR 39, nonfasting blood glucose 278, WBC 12.7, hemoglobin 12.8, platelets of 298.   Lactic acid 2.2. UA was negative for leukocytes and nitrites. Blood cultures x 2 have been collected and are in process.   ED treatment: Sodium chloride 1 L bolus   TRH consulted for admission and further management as below.  6/10: Remained hemodynamically stable.  Awaiting SNF placement.  6/14: Patient got approved after peer to peer with Dr. Logan Bores, patient most likely will need long-term placement-5 was advised to work on getting him Medicaid so he can go for long-term care.  Unfortunately facility did not responded back and likely he will stay until Monday.  Sepsis ruled out as there was no obvious source of infection.  6/11: Medically stable-awaiting SNF placement. 6/12: Hemodynamically stable.  Mildly elevated postprandial CBG-adding 2 units of NovoLog with meals.  Still awaiting placement. 6/13: Medically stable.  Now had a bed offer-pending insurance authorization  Assessment and Plan: Severe sepsis [facility]) Patient had a high heart rate, respiration rate, leukocytosis of 12.7, elevated lactic acid of 2.2, organ involvement is renal Blood cultures x 2 negative Procalcitonin 0.1 low S/p Vancomycin, cefepime and metronidazole, S/p NS bolus  Continue Tele Vital  signs stable, sepsis resolved.  No source of infection.  Discontinued antibiotics on 6/7 Monitor off antibiotics for now-sepsis ruled out     Altered mental status Resolved.  Now at baseline.   Nonverbal At baseline, secondary to history of CVA   Metabolic acidosis, CO2 17--22 improved S/p bicarbonate oral   AKI most likely due to dehydration, Resolved after IVF  Renal functions and urine output daily Continue IV fluid for hydration   Hyperkalemia, Resolved  Presumed secondary to acute kidney injury Status post sepsis bolus ordered Post sepsis ordered sodium chloride infusion at 125 mL/h, 1 day ordered   Hypophosphatemia due to nutritional deficiency.  Phos repleted. Monitor electrolytes and replete as needed.   Elevated troponin, trending down, most likely demand ischemia Continue tele monitor   Pressure injury of skin Present on admission Wound care consulted   History of CVA in adulthood Aspirin 81 mg daily resumed   Insulin dependent type 2 diabetes mellitus (HCC) Insulin SSI with at bedtime coverage Goal inpatient blood glucose levels 140-180 Adding 2 unit of NovoLog with meals if eats more than 50% of meals   Essential hypertension Resumed propranolol and irbesartan home dose Hydralazine 5 mg IV every 8 hours as needed for SBP greater than 175, 5 days ordered   # Generalized weakness could be secondary to vitamin B12, folate and vitamin D deficiency. # Vitamin D deficiency: started vitamin D 50,000 units p.o. weekly, follow with PCP to repeat vitamin D level after 3 to 6 months. # Folic acid deficiency, folate level 3.5, started folic acid 1 mg p.o. daily # Vitamin B12 deficiency: B12 level 147, Goal >400, Started vitamin B12  1000 mcg IM injection daily during hospital stay, followed by oral supplement.  Follow-up PCP to repeat vitamin B12 level after 3 to 6 months.       Subjective: Patient with no new concern.  Physical Exam: Vitals:   02/22/23 0427  02/22/23 0737 02/22/23 0800 02/22/23 1611  BP: (!) 83/66 (!) 88/78 100/60 112/73  Pulse: 92 74 (!) 58 95  Resp: 20 16  16   Temp: 98 F (36.7 C) (!) 97.5 F (36.4 C)  97.8 F (36.6 C)  TempSrc:    Oral  SpO2: 96% 96%  96%  Weight:      Height:      Physical Exam:  General.  Frail elderly man, in no acute distress. Pulmonary.  Lungs clear bilaterally, normal respiratory effort. CV.  Regular rate and rhythm, no JVD, rub or murmur. Abdomen.  Soft, nontender, nondistended, BS positive. CNS.  Sleeping.  No apparent neurodeficit Extremities.  No edema, no cyanosis, pulses intact and symmetrical.  Data Reviewed: Prior data reviewed  Family Communication:   Disposition: Status is: Inpatient Remains inpatient appropriate because: Severity of illness.  Now medically stable  Planned Discharge Destination: Skilled nursing facility  DVT prophylaxis.  Eliquis Time spent: 36 minutes  This record has been created using Conservation officer, historic buildings. Errors have been sought and corrected,but may not always be located. Such creation errors do not reflect on the standard of care.   Author: Arnetha Courser, MD 02/22/2023 4:29 PM  For on call review [email or web address].

## 2023-02-23 DIAGNOSIS — A419 Sepsis, unspecified organism: Secondary | ICD-10-CM | POA: Diagnosis not present

## 2023-02-23 DIAGNOSIS — R531 Weakness: Secondary | ICD-10-CM | POA: Diagnosis not present

## 2023-02-23 DIAGNOSIS — R410 Disorientation, unspecified: Secondary | ICD-10-CM | POA: Diagnosis not present

## 2023-02-23 DIAGNOSIS — E1142 Type 2 diabetes mellitus with diabetic polyneuropathy: Secondary | ICD-10-CM | POA: Diagnosis not present

## 2023-02-23 LAB — GLUCOSE, CAPILLARY
Glucose-Capillary: 160 mg/dL — ABNORMAL HIGH (ref 70–99)
Glucose-Capillary: 119 mg/dL — ABNORMAL HIGH (ref 70–99)
Glucose-Capillary: 118 mg/dL — ABNORMAL HIGH (ref 70–99)
Glucose-Capillary: 105 mg/dL — ABNORMAL HIGH (ref 70–99)

## 2023-02-23 NOTE — Progress Notes (Signed)
Progress Note   Patient: Walter Harris QIO:[id number] DOB: 1946/09/04 DOA: 02/12/2023     11 DOS: the patient was seen and examined on 02/23/2023   Brief hospital course: [name] is a 77 year old male with history of prior CVA with baseline nonverbal status, insulin-dependent diabetes mellitus, hypertension, hyperlipidemia, who presents emergency department for chief concerns of generalized weakness.   Vitals in the ED showed temperature of 98.2, respiration rate of 21, heart rate of 105, blood pressure 164/95, SpO2 95% on room air.   Serum sodium is 137, potassium 5.4, chloride 109, bicarb 17, BUN 63, serum creatinine is 1.79, EGFR 39, nonfasting blood glucose 278, WBC 12.7, hemoglobin 12.8, platelets of 298.   Lactic acid 2.2. UA was negative for leukocytes and nitrites. Blood cultures x 2 have been collected and are in process.   ED treatment: Sodium chloride 1 L bolus   TRH consulted for admission and further management as below.  6/10: Remained hemodynamically stable.  Awaiting SNF placement.  6/14: Patient got approved after peer to peer with Dr. Logan Bores, patient most likely will need long-term placement-5 was advised to work on getting him Medicaid so he can go for long-term care.  Unfortunately facility did not responded back and likely he will stay until Monday.  Sepsis ruled out as there was no obvious source of infection.  6/11: Medically stable-awaiting SNF placement. 6/12: Hemodynamically stable.  Mildly elevated postprandial CBG-adding 2 units of NovoLog with meals.  Still awaiting placement. 6/13: Medically stable.  Now had a bed offer-pending insurance authorization  Assessment and Plan: Severe sepsis [facility]) Patient had a high heart rate, respiration rate, leukocytosis of 12.7, elevated lactic acid of 2.2, organ involvement is renal Blood cultures x 2 negative Procalcitonin 0.1 low S/p Vancomycin, cefepime and metronidazole, S/p NS bolus  Continue Tele Vital  signs stable, sepsis resolved.  No source of infection.  Discontinued antibiotics on 6/7 Monitor off antibiotics for now-sepsis ruled out     Altered mental status Resolved.  Now at baseline.   Nonverbal At baseline, secondary to history of CVA   Metabolic acidosis, CO2 17--22 improved S/p bicarbonate oral   AKI most likely due to dehydration, Resolved after IVF  Renal functions and urine output daily Continue IV fluid for hydration   Hyperkalemia, Resolved  Presumed secondary to acute kidney injury Status post sepsis bolus ordered Post sepsis ordered sodium chloride infusion at 125 mL/h, 1 day ordered   Hypophosphatemia due to nutritional deficiency.  Phos repleted. Monitor electrolytes and replete as needed.   Elevated troponin, trending down, most likely demand ischemia Continue tele monitor   Pressure injury of skin Present on admission Wound care consulted   History of CVA in adulthood Aspirin 81 mg daily resumed   Insulin dependent type 2 diabetes mellitus (HCC) Insulin SSI with at bedtime coverage Goal inpatient blood glucose levels 140-180 Adding 2 unit of NovoLog with meals if eats more than 50% of meals   Essential hypertension Resumed propranolol and irbesartan home dose Hydralazine 5 mg IV every 8 hours as needed for SBP greater than 175, 5 days ordered   # Generalized weakness could be secondary to vitamin B12, folate and vitamin D deficiency. # Vitamin D deficiency: started vitamin D 50,000 units p.o. weekly, follow with PCP to repeat vitamin D level after 3 to 6 months. # Folic acid deficiency, folate level 3.5, started folic acid 1 mg p.o. daily # Vitamin B12 deficiency: B12 level 147, Goal >400, Started vitamin B12  1000 mcg IM injection daily during hospital stay, followed by oral supplement.  Follow-up PCP to repeat vitamin B12 level after 3 to 6 months.       Subjective: Patient was lying down comfortably when seen today.  Remains nonverbal.  No  nursing concern  Physical Exam: Vitals:   02/22/23 1611 02/22/23 2025 02/23/23 0446 02/23/23 0820  BP: 112/73 102/77 (!) 143/87 102/77  Pulse: 95 94 (!) 108 (!) 110  Resp: 16 20 20 16   Temp: 97.8 F (36.6 C) 97.7 F (36.5 C) 97.9 F (36.6 C) (!) 97.5 F (36.4 C)  TempSrc: Oral   Oral  SpO2: 96% 99% 97% 96%  Weight:      Height:      Physical Exam:  General.  Frail elderly man, in no acute distress. Pulmonary.  Lungs clear bilaterally, normal respiratory effort. CV.  Regular rate and rhythm, no JVD, rub or murmur. Abdomen.  Soft, nontender, nondistended, BS positive. CNS.  Awake, nonverbal Extremities.  No edema, no cyanosis, pulses intact and symmetrical.  Data Reviewed: Prior data reviewed  Family Communication:   Disposition: Status is: Inpatient Remains inpatient appropriate because: Severity of illness.  Now medically stable  Planned Discharge Destination: Skilled nursing facility  DVT prophylaxis.  Eliquis Time spent: 37 minutes  This record has been created using Conservation officer, historic buildings. Errors have been sought and corrected,but may not always be located. Such creation errors do not reflect on the standard of care.   Author: Arnetha Courser, MD 02/23/2023 3:03 PM  For on call review [email or web address].

## 2023-02-24 DIAGNOSIS — R251 Tremor, unspecified: Secondary | ICD-10-CM | POA: Diagnosis not present

## 2023-02-24 DIAGNOSIS — E8721 Acute metabolic acidosis: Secondary | ICD-10-CM | POA: Diagnosis not present

## 2023-02-24 DIAGNOSIS — M62838 Other muscle spasm: Secondary | ICD-10-CM | POA: Diagnosis not present

## 2023-02-24 DIAGNOSIS — G4700 Insomnia, unspecified: Secondary | ICD-10-CM | POA: Diagnosis not present

## 2023-02-24 DIAGNOSIS — M419 Scoliosis, unspecified: Secondary | ICD-10-CM | POA: Diagnosis not present

## 2023-02-24 DIAGNOSIS — W19XXXA Unspecified fall, initial encounter: Secondary | ICD-10-CM | POA: Diagnosis not present

## 2023-02-24 DIAGNOSIS — Z794 Long term (current) use of insulin: Secondary | ICD-10-CM | POA: Diagnosis not present

## 2023-02-24 DIAGNOSIS — E875 Hyperkalemia: Secondary | ICD-10-CM | POA: Diagnosis not present

## 2023-02-24 DIAGNOSIS — E559 Vitamin D deficiency, unspecified: Secondary | ICD-10-CM | POA: Diagnosis not present

## 2023-02-24 DIAGNOSIS — Z7982 Long term (current) use of aspirin: Secondary | ICD-10-CM | POA: Diagnosis not present

## 2023-02-24 DIAGNOSIS — E0800 Diabetes mellitus due to underlying condition with hyperosmolarity without nonketotic hyperglycemic-hyperosmolar coma (NKHHC): Secondary | ICD-10-CM | POA: Diagnosis not present

## 2023-02-24 DIAGNOSIS — E43 Unspecified severe protein-calorie malnutrition: Secondary | ICD-10-CM | POA: Diagnosis not present

## 2023-02-24 DIAGNOSIS — E7800 Pure hypercholesterolemia, unspecified: Secondary | ICD-10-CM | POA: Diagnosis not present

## 2023-02-24 DIAGNOSIS — M720 Palmar fascial fibromatosis [Dupuytren]: Secondary | ICD-10-CM | POA: Diagnosis not present

## 2023-02-24 DIAGNOSIS — A419 Sepsis, unspecified organism: Secondary | ICD-10-CM | POA: Diagnosis not present

## 2023-02-24 DIAGNOSIS — R4701 Aphasia: Secondary | ICD-10-CM | POA: Diagnosis not present

## 2023-02-24 DIAGNOSIS — R6520 Severe sepsis without septic shock: Secondary | ICD-10-CM | POA: Diagnosis not present

## 2023-02-24 DIAGNOSIS — Z23 Encounter for immunization: Secondary | ICD-10-CM | POA: Diagnosis not present

## 2023-02-24 DIAGNOSIS — I129 Hypertensive chronic kidney disease with stage 1 through stage 4 chronic kidney disease, or unspecified chronic kidney disease: Secondary | ICD-10-CM | POA: Diagnosis not present

## 2023-02-24 DIAGNOSIS — F419 Anxiety disorder, unspecified: Secondary | ICD-10-CM | POA: Diagnosis not present

## 2023-02-24 DIAGNOSIS — R2689 Other abnormalities of gait and mobility: Secondary | ICD-10-CM | POA: Diagnosis not present

## 2023-02-24 DIAGNOSIS — E119 Type 2 diabetes mellitus without complications: Secondary | ICD-10-CM | POA: Diagnosis not present

## 2023-02-24 DIAGNOSIS — R410 Disorientation, unspecified: Secondary | ICD-10-CM | POA: Diagnosis not present

## 2023-02-24 DIAGNOSIS — I69398 Other sequelae of cerebral infarction: Secondary | ICD-10-CM | POA: Diagnosis not present

## 2023-02-24 DIAGNOSIS — I10 Essential (primary) hypertension: Secondary | ICD-10-CM | POA: Diagnosis not present

## 2023-02-24 DIAGNOSIS — Z7984 Long term (current) use of oral hypoglycemic drugs: Secondary | ICD-10-CM | POA: Diagnosis not present

## 2023-02-24 DIAGNOSIS — R0902 Hypoxemia: Secondary | ICD-10-CM | POA: Diagnosis not present

## 2023-02-24 DIAGNOSIS — R531 Weakness: Secondary | ICD-10-CM | POA: Diagnosis not present

## 2023-02-24 DIAGNOSIS — R4182 Altered mental status, unspecified: Secondary | ICD-10-CM | POA: Diagnosis not present

## 2023-02-24 DIAGNOSIS — N179 Acute kidney failure, unspecified: Secondary | ICD-10-CM | POA: Diagnosis not present

## 2023-02-24 DIAGNOSIS — E538 Deficiency of other specified B group vitamins: Secondary | ICD-10-CM | POA: Diagnosis not present

## 2023-02-24 DIAGNOSIS — Z743 Need for continuous supervision: Secondary | ICD-10-CM | POA: Diagnosis not present

## 2023-02-24 DIAGNOSIS — I69320 Aphasia following cerebral infarction: Secondary | ICD-10-CM | POA: Diagnosis not present

## 2023-02-24 DIAGNOSIS — I69322 Dysarthria following cerebral infarction: Secondary | ICD-10-CM | POA: Diagnosis not present

## 2023-02-24 DIAGNOSIS — Z8673 Personal history of transient ischemic attack (TIA), and cerebral infarction without residual deficits: Secondary | ICD-10-CM | POA: Diagnosis not present

## 2023-02-24 DIAGNOSIS — D519 Vitamin B12 deficiency anemia, unspecified: Secondary | ICD-10-CM | POA: Diagnosis not present

## 2023-02-24 DIAGNOSIS — E1142 Type 2 diabetes mellitus with diabetic polyneuropathy: Secondary | ICD-10-CM | POA: Diagnosis not present

## 2023-02-24 DIAGNOSIS — L89222 Pressure ulcer of left hip, stage 2: Secondary | ICD-10-CM | POA: Diagnosis not present

## 2023-02-24 DIAGNOSIS — M6281 Muscle weakness (generalized): Secondary | ICD-10-CM | POA: Diagnosis not present

## 2023-02-24 DIAGNOSIS — S41111A Laceration without foreign body of right upper arm, initial encounter: Secondary | ICD-10-CM | POA: Diagnosis not present

## 2023-02-24 LAB — GLUCOSE, CAPILLARY
Glucose-Capillary: 136 mg/dL — ABNORMAL HIGH (ref 70–99)
Glucose-Capillary: 144 mg/dL — ABNORMAL HIGH (ref 70–99)

## 2023-02-24 MED ORDER — FOLIC ACID 1 MG PO TABS: 1.0000 mg | ORAL_TABLET | Freq: Every day | ORAL | Status: DC

## 2023-02-24 MED ORDER — VITAMIN D (ERGOCALCIFEROL) 1.25 MG (50000 UNIT) PO CAPS: 50000.0000 [IU] | ORAL_CAPSULE | ORAL | Status: DC

## 2023-02-24 MED ORDER — ALPRAZOLAM 0.5 MG PO TABS: 0.5000 mg | ORAL_TABLET | Freq: Every day | ORAL | 0 refills | Status: DC | PRN

## 2023-02-24 MED ORDER — ENSURE ENLIVE PO LIQD: 237.0000 mL | Freq: Two times a day (BID) | ORAL | 12 refills | Status: DC

## 2023-02-24 MED ORDER — ADULT MULTIVITAMIN W/MINERALS CH: 1.0000 | ORAL_TABLET | Freq: Every day | ORAL | Status: DC

## 2023-02-24 MED ORDER — CYANOCOBALAMIN 1000 MCG PO TABS: 1000.0000 ug | ORAL_TABLET | Freq: Every day | ORAL | Status: DC

## 2023-02-24 MED ORDER — OXYCODONE-ACETAMINOPHEN 5-325 MG PO TABS: ORAL_TABLET | ORAL | 0 refills | Status: AC

## 2023-02-24 NOTE — Progress Notes (Signed)
Patient report given to RN patty. All questions are addressed via phone.

## 2023-02-24 NOTE — Discharge Summary (Signed)
Physician Discharge Summary   Patient: Walter Harris MRN: [id number] DOB: [birth year]  Admit date:     02/12/2023  Discharge date: 02/24/23  Discharge Physician: Walter Harris   PCP: Walter Nam, MD   Recommendations at discharge:  Please obtain CBC and BMP in 1 week Follow-up with primary care provider Follow-up with outpatient neurology  Discharge Diagnoses: Principal Problem:   Severe sepsis [facility]) Active Problems:   HLD (hyperlipidemia)   Essential hypertension   Insulin dependent type 2 diabetes mellitus (HCC)   Diabetic polyneuropathy (HCC)   Anxiety state   Tremor, essential   History of CVA in adulthood   Dupuytren contracture   Insomnia   Pressure injury of skin   Hyperkalemia   Nonverbal   Altered mental status   Weakness generalized   Protein-calorie malnutrition, severe   Hospital Course: [name] is a 77 year old male with history of prior CVA with baseline nonverbal status, insulin-dependent diabetes mellitus, hypertension, hyperlipidemia, who presents emergency department for chief concerns of generalized weakness.  Vitals in the ED showed temperature of 98.2, respiration rate of 21, heart rate of 105, blood pressure 164/95, SpO2 95% on room air.  Serum sodium is 137, potassium 5.4, chloride 109, bicarb 17, BUN 63, serum creatinine is 1.79, EGFR 39, nonfasting blood glucose 278, WBC 12.7, hemoglobin 12.8, platelets of 298.  Lactic acid 2.2. UA was negative for leukocytes and nitrites. Blood cultures x 2 have been collected and are in process.  ED treatment: Sodium chloride 1 L bolus  6/10: Hemodynamically stable.  Labs seems stable.  Blood cultures remain negative.  No urinary cultures obtained.  His presentation was thought to be due to dehydration.  Procalcitonin was negative.  Initially received broad-spectrum antibiotics and IV fluid.  Antibiotics were discontinued on 6/7 and he remained stable and afebrile since then.  Also found to have  multiple nutritional deficiencies and started on supplement which include hypophosphatemia, vitamin B-12, folate and vitamin D deficiency. PT is recommending SNF.  Sepsis ruled out as there was no obvious source of infection.  Patient remained medically stable.  Hospitalization got prolonged as having difficulty for SNF placement.  Peer to peer was done and he got finally approved but appears to be close to his baseline.  Patient most likely will need long-term placement as wife is unable to take care of him.  There was a report of calling EMS frequently to change his diapers at home.  He mostly spent his time in a recliner.  Goal is to become strong enough so he can transfer himself from bed to wheelchair and to a recliner at least.  Wife need to work with Medicaid services to see if he can get some extra financial assistance for his future needs.  He will continue on current medications and need to have a close follow-up with his providers for further recommendations.    Assessment and Plan: Severe sepsis [facility]) Patient had a high heart rate, respiration rate, leukocytosis of 12.7, elevated lactic acid of 2.2, organ involvement is renal Blood cultures x 2 negative Procalcitonin 0.1 low S/p Vancomycin, cefepime and metronidazole, S/p NS bolus  Continue Tele Vital signs stable, sepsis resolved.  No source of infection.  Discontinued antibiotics on 6/7 Monitor off antibiotics for now-sepsis ruled out     Altered mental status Resolved.  Now at baseline.   Nonverbal At baseline, secondary to history of CVA   Metabolic acidosis, CO2 17--22 improved S/p bicarbonate oral   AKI most  likely due to dehydration, Resolved after IVF  Renal functions and urine output daily Continue IV fluid for hydration   Hyperkalemia, Resolved  Presumed secondary to acute kidney injury Status post sepsis bolus ordered Post sepsis ordered sodium chloride infusion at 125 mL/h, 1 day ordered    Hypophosphatemia due to nutritional deficiency.  Phos repleted. Monitor electrolytes and replete as needed.   Elevated troponin, trending down, most likely demand ischemia Continue tele monitor   Pressure injury of skin Present on admission Wound care consulted   History of CVA in adulthood Aspirin 81 mg daily resumed   Insulin dependent type 2 diabetes mellitus (HCC) Insulin SSI with at bedtime coverage Goal inpatient blood glucose levels 140-180 Adding 2 unit of NovoLog with meals if eats more than 50% of meals   Essential hypertension Resumed propranolol and irbesartan home dose Hydralazine 5 mg IV every 8 hours as needed for SBP greater than 175, 5 days ordered   # Generalized weakness could be secondary to vitamin B12, folate and vitamin D deficiency. # Vitamin D deficiency: started vitamin D 50,000 units p.o. weekly, follow with PCP to repeat vitamin D level after 3 to 6 months. # Folic acid deficiency, folate level 3.5, started folic acid 1 mg p.o. daily # Vitamin B12 deficiency: B12 level 147, Goal >400, Started vitamin B12 1000 mcg IM injection daily during hospital stay, followed by oral supplement.  Follow-up PCP to repeat vitamin B12 level after 3 to 6 months.       Pain control - [employer] Controlled Substance Reporting System database was reviewed. and patient was instructed, not to drive, operate heavy machinery, perform activities at heights, swimming or participation in water activities or provide baby-sitting services while on Pain, Sleep and Anxiety Medications; until their outpatient Physician has advised to do so again. Also recommended to not to take more than prescribed Pain, Sleep and Anxiety Medications.  Consultants: None Procedures performed: None Disposition: Skilled nursing facility Diet recommendation:  Discharge Diet Orders (From admission, onward)     Start     Ordered   02/24/23 0000  Diet - low sodium heart healthy        02/24/23 1030            Cardiac and Carb modified diet DISCHARGE MEDICATION: Allergies as of 02/24/2023       Reactions   Bee Venom Anaphylaxis   Pravastatin Other (See Comments)   Severe myalgias   Ace Inhibitors    Cough   Actos [pioglitazone] Other (See Comments)   Intolerant, not allergic.    Angiotensin Receptor Blockers Other (See Comments)   cramps   Aripiprazole Other (See Comments)   Unknown reaction many years ago   Crestor [rosuvastatin Calcium] Other (See Comments)   Aches, even with twice weekly dosing   Invokana [canagliflozin]    Edema   Metformin And Related Other (See Comments)   Able to tolerate XR formulation, not allergic.    Nsaids    Would avoid given creatinine elevation.   Olanzapine Other (See Comments)   Unknown reaction many years ago   Pneumovax 23 [pneumococcal Vac Polyvalent] Other (See Comments)   aches   Codeine Rash   Divalproex Sodium Other (See Comments)    tremor   Lithium Carbonate Other (See Comments)   Tremors   Sulfamethoxazole-trimethoprim Other (See Comments)   Mild reaction per Dr. Alphonsus Sias, pt does not remember the reaction        Medication List  STOP taking these medications    clopidogrel 75 MG tablet Commonly known as: PLAVIX       TAKE these medications    ALPRAZolam 0.5 MG tablet Commonly known as: XANAX Take 1 tablet (0.5 mg total) by mouth daily as needed for anxiety. What changed: See the new instructions.   aspirin EC 81 MG tablet Take 1 tablet (81 mg total) by mouth daily. For stroke prevention What changed: when to take this   BD Pen Needle Nano U/F 32G X 4 MM Misc Generic drug: Insulin Pen Needle USE AS DIRECTED   cyanocobalamin 1000 MCG tablet Take 1 tablet (1,000 mcg total) by mouth daily.   feeding supplement Liqd Take 237 mLs by mouth 2 (two) times daily between meals.   FLUoxetine 10 MG capsule Commonly known as: PROZAC Take 3 capsules (30 mg total) by mouth daily with lunch. TAKE 3  CAPSULES BY MOUTH DAILY FOR MOOD AS DIRECTED   folic acid 1 MG tablet Commonly known as: FOLVITE Take 1 tablet (1 mg total) by mouth daily.   glipiZIDE 10 MG 24 hr tablet Commonly known as: GLUCOTROL XL Take 1 tablet (10 mg total) by mouth daily with breakfast. For diabetes   irbesartan 150 MG tablet Commonly known as: AVAPRO Take 150 mg by mouth daily.   Lantus SoloStar 100 UNIT/ML Solostar Pen Generic drug: insulin glargine Inject 20 Units into the skin at bedtime. For diabetes   metFORMIN 500 MG 24 hr tablet Commonly known as: GLUCOPHAGE-XR TAKE ONE TABLET 3 TIMES DAILY for diabetes What changed:  how much to take how to take this when to take this   multivitamin with minerals Tabs tablet Take 1 tablet by mouth daily.   oxyCODONE-acetaminophen 5-325 MG tablet Commonly known as: PERCOCET/ROXICET TAKE 1-2 TABLETS BY MOUTH 3 TIMES DAILY AS NEEDED FOR PAIN. Fill on/after 60 days from rx   oxyCODONE-acetaminophen 5-325 MG tablet Commonly known as: PERCOCET/ROXICET TAKE ONE TO TWO TABLETS BY MOUTH 3 TIMES DAILY IF NEEDED FOR PAIN.   oxyCODONE-acetaminophen 5-325 MG tablet Commonly known as: PERCOCET/ROXICET TAKE ONE TO TWO TABLETS BY MOUTH 3 TIMESDAILY AS NEEDED FOR PAIN.  Fill on/after 30 days from rx   primidone 50 MG tablet Commonly known as: MYSOLINE TAKE 1 TABLET BY MOUTH DAILY   propranolol ER 60 MG 24 hr capsule Commonly known as: INDERAL LA TAKE 1 CAPSULE BY MOUTH ONCE DAILY   tiZANidine 4 MG tablet Commonly known as: ZANAFLEX TAKE ONE TABLET 3 TIMES DAILY AS NEEDED FOR MUSCLE SPASM MAY TAKE 1 EXTRADOSE DAILY AS NEEDED   traZODone 50 MG tablet Commonly known as: DESYREL TAKE 1 AND 1/2 TABLETS BY MOUTH AT BEDTIME AS NEEDED FOR SLEEP   Vitamin D (Ergocalciferol) 1.25 MG (50000 UNIT) Caps capsule Commonly known as: DRISDOL Take 1 capsule (50,000 Units total) by mouth every 7 (seven) days. Start taking on: February 27, 2023               Discharge  Care Instructions  (From admission, onward)           Start     Ordered   02/24/23 0000  Discharge wound care:       Comments: Silicone foam dressing to the left trochanter, change every 3 days. ASSESS  under dressings EACH SHIFT for any acute changes in the wounds.   02/24/23 1030            Contact information for follow-up providers     Crawford Givens  S, MD. Schedule an appointment as soon as possible for a visit in 1 week(s).   Specialty: Family Medicine Contact information: [address] [place] [address] [phone]              Contact information for after-discharge care     Destination     [facility] Preferred SNF .   Service: Skilled Nursing Contact information: [address] [place] [address] [phone]                    Discharge Exam: Ceasar Mons Weights   02/12/23 1225 02/19/23 0339  Weight: 75.8 kg 77.7 kg   General.  Frail, nonverbal elderly man.  Appears comfortable Pulmonary.  Lungs clear bilaterally, normal respiratory effort. CV.  Regular rate and rhythm, no JVD, rub or murmur. Abdomen.  Soft, nontender, nondistended, BS positive. CNS.  Nonverbal, no apparent neurodeficit Extremities.  No edema, no cyanosis, pulses intact and symmetrical.  Condition at discharge: stable  The results of significant diagnostics from this hospitalization (including imaging, microbiology, ancillary and laboratory) are listed below for reference.   Imaging Studies: DG Chest 2 View  Result Date: 02/12/2023 CLINICAL DATA:  Suspected sepsis. EXAM: CHEST - 2 VIEW COMPARISON:  07/31/2020 and 12/06/2019 FINDINGS: Two views of the chest were obtained. Low lung volumes are similar to prior examinations. Vague opacity along the periphery of the left lung base is probably unchanged since 2021. Otherwise, the lungs are clear. Heart size is normal.  Surgical plates in the cervical spine. Negative for a pneumothorax. Multilevel degenerative endplate changes in the thoracic spine. No large pleural effusions. IMPRESSION: No active cardiopulmonary disease. Electronically Signed   By: Richarda Overlie M.D.   On: 02/12/2023 12:02    Microbiology: Results for orders placed or performed during the hospital encounter of 02/12/23  Culture, blood (Routine x 2)     Status: None   Collection Time: 02/12/23 11:38 AM   Specimen: BLOOD  Result Value Ref Range Status   Specimen Description BLOOD BLOOD RIGHT FOREARM  Final   Special Requests   Final    BOTTLES DRAWN AEROBIC AND ANAEROBIC Blood Culture adequate volume   Culture   Final    NO GROWTH 5 DAYS Performed at [facility], [address] [place], [place] [address]    Report Status 02/17/2023 FINAL  Final  Culture, blood (Routine x 2)     Status: None   Collection Time: 02/12/23 11:38 AM   Specimen: BLOOD  Result Value Ref Range Status   Specimen Description BLOOD BLOOD RIGHT ARM  Final   Special Requests   Final    BOTTLES DRAWN AEROBIC AND ANAEROBIC Blood Culture adequate volume   Culture   Final    NO GROWTH 5 DAYS Performed at [facility], [address] [place], [place] [address]    Report Status 02/17/2023 FINAL  Final    Labs: CBC: Recent Labs  Lab 02/18/23 0533  WBC 8.9  HGB 11.5*  HCT 33.7*  MCV 90.3  PLT 191   Basic Metabolic Panel: Recent Labs  Lab 02/18/23 0533  NA 135  K 4.2  CL 104  CO2 24  GLUCOSE 117*  BUN 21  CREATININE 0.91  CALCIUM 8.0*   Liver Function Tests: No results for input(s): "AST", "ALT", "ALKPHOS", "BILITOT", "PROT", "ALBUMIN" in the last 168 hours. CBG: Recent Labs  Lab 02/23/23 1210 02/23/23 1622  02/23/23 2140 02/24/23 0626 02/24/23 0837  GLUCAP 119* 118* 105* 136* 144*    Discharge time spent: greater than 30 minutes.  This record has been created using Conservation officer, historic buildings. Errors have been  sought and corrected,but may not always be located. Such creation errors do not reflect on the standard of care.   Signed: Arnetha Courser, MD [facility] 02/24/2023

## 2023-02-24 NOTE — Care Management Important Message (Signed)
Important Message  Patient Details  Name: Walter Harris MRN: [id number] Date of Birth: 10/22/1945   Medicare Important Message Given:  Yes  Late entry: 11:20 am - Left a message for the patient's wife [name] ([phone]) to call me to review the form but did not receive a call back and patient has discharged now.    [name] [name] 02/24/2023, 11:58 AM

## 2023-02-24 NOTE — TOC Transition Note (Addendum)
Transition of Care [facility]) - CM/SW Discharge Note   Patient Details  Name: Walter Harris MRN: [id number] Date of Birth: June 13, 1946  Transition [facility]) CM/SW Contact:  Allena Katz, LCSW Phone Number: 02/24/2023, 9:32 AM   Clinical Narrative:   Pt to discharge to [facility]. Medical Necessity printed to unit. RN given number for report. Tiffany notified. CSW will call ems and send dc summary once in.  DNR signed and on chart.    Final next level of care: Skilled Nursing Facility Barriers to Discharge: Barriers Resolved   Patient Goals and CMS Choice CMS Medicare.gov Compare Post Acute Care list provided to:: Patient Choice offered to / list presented to : Patient  Discharge Placement                Patient [facility] bed at: [facility] Patient to be transferred to facility by: [facility] Name of family member notified: spouse Patient and family notified of of transfer: 02/21/23  Discharge Plan and Services Additional resources added to the After Visit Summary for                                       Social Determinants of Health (SDOH) Interventions SDOH Screenings   Food Insecurity: No Food Insecurity (11/01/2022)  Housing: High Risk (02/13/2023)  Transportation Needs: Patient Unable To Answer (02/13/2023)  Utilities: Patient Unable To Answer (02/13/2023)  Alcohol Screen: Low Risk  (10/31/2021)  Depression (PHQ2-9): Low Risk  (11/01/2022)  Financial Resource Strain: Low Risk  (11/01/2022)  Physical Activity: Inactive (11/01/2022)  Social Connections: Socially Isolated (11/01/2022)  Stress: No Stress Concern Present (11/01/2022)  Tobacco Use: Medium Risk (02/13/2023)     Readmission Risk Interventions     No data to display

## 2023-02-25 DIAGNOSIS — E0800 Diabetes mellitus due to underlying condition with hyperosmolarity without nonketotic hyperglycemic-hyperosmolar coma (NKHHC): Secondary | ICD-10-CM | POA: Diagnosis not present

## 2023-02-25 DIAGNOSIS — I10 Essential (primary) hypertension: Secondary | ICD-10-CM | POA: Diagnosis not present

## 2023-02-28 DIAGNOSIS — Z7984 Long term (current) use of oral hypoglycemic drugs: Secondary | ICD-10-CM | POA: Diagnosis not present

## 2023-02-28 DIAGNOSIS — G4700 Insomnia, unspecified: Secondary | ICD-10-CM | POA: Diagnosis not present

## 2023-02-28 DIAGNOSIS — I10 Essential (primary) hypertension: Secondary | ICD-10-CM | POA: Diagnosis not present

## 2023-02-28 DIAGNOSIS — L89222 Pressure ulcer of left hip, stage 2: Secondary | ICD-10-CM | POA: Diagnosis not present

## 2023-02-28 DIAGNOSIS — D519 Vitamin B12 deficiency anemia, unspecified: Secondary | ICD-10-CM | POA: Diagnosis not present

## 2023-02-28 DIAGNOSIS — F419 Anxiety disorder, unspecified: Secondary | ICD-10-CM | POA: Diagnosis not present

## 2023-02-28 DIAGNOSIS — Z794 Long term (current) use of insulin: Secondary | ICD-10-CM | POA: Diagnosis not present

## 2023-02-28 DIAGNOSIS — E1142 Type 2 diabetes mellitus with diabetic polyneuropathy: Secondary | ICD-10-CM | POA: Diagnosis not present

## 2023-02-28 DIAGNOSIS — E559 Vitamin D deficiency, unspecified: Secondary | ICD-10-CM | POA: Diagnosis not present

## 2023-02-28 DIAGNOSIS — E875 Hyperkalemia: Secondary | ICD-10-CM | POA: Diagnosis not present

## 2023-02-28 DIAGNOSIS — I69398 Other sequelae of cerebral infarction: Secondary | ICD-10-CM | POA: Diagnosis not present

## 2023-02-28 DIAGNOSIS — M6281 Muscle weakness (generalized): Secondary | ICD-10-CM | POA: Diagnosis not present

## 2023-03-03 DIAGNOSIS — G4700 Insomnia, unspecified: Secondary | ICD-10-CM | POA: Diagnosis not present

## 2023-03-03 DIAGNOSIS — E1142 Type 2 diabetes mellitus with diabetic polyneuropathy: Secondary | ICD-10-CM | POA: Diagnosis not present

## 2023-03-03 DIAGNOSIS — F419 Anxiety disorder, unspecified: Secondary | ICD-10-CM | POA: Diagnosis not present

## 2023-03-03 DIAGNOSIS — M6281 Muscle weakness (generalized): Secondary | ICD-10-CM | POA: Diagnosis not present

## 2023-03-03 DIAGNOSIS — Z7984 Long term (current) use of oral hypoglycemic drugs: Secondary | ICD-10-CM | POA: Diagnosis not present

## 2023-03-03 DIAGNOSIS — I69398 Other sequelae of cerebral infarction: Secondary | ICD-10-CM | POA: Diagnosis not present

## 2023-03-03 DIAGNOSIS — I10 Essential (primary) hypertension: Secondary | ICD-10-CM | POA: Diagnosis not present

## 2023-03-03 DIAGNOSIS — A419 Sepsis, unspecified organism: Secondary | ICD-10-CM | POA: Diagnosis not present

## 2023-03-03 DIAGNOSIS — N179 Acute kidney failure, unspecified: Secondary | ICD-10-CM | POA: Diagnosis not present

## 2023-03-03 DIAGNOSIS — Z794 Long term (current) use of insulin: Secondary | ICD-10-CM | POA: Diagnosis not present

## 2023-03-04 DIAGNOSIS — N179 Acute kidney failure, unspecified: Secondary | ICD-10-CM | POA: Diagnosis not present

## 2023-03-04 DIAGNOSIS — R4182 Altered mental status, unspecified: Secondary | ICD-10-CM | POA: Diagnosis not present

## 2023-03-04 DIAGNOSIS — E1142 Type 2 diabetes mellitus with diabetic polyneuropathy: Secondary | ICD-10-CM | POA: Diagnosis not present

## 2023-03-04 DIAGNOSIS — L89222 Pressure ulcer of left hip, stage 2: Secondary | ICD-10-CM | POA: Diagnosis not present

## 2023-03-04 DIAGNOSIS — F419 Anxiety disorder, unspecified: Secondary | ICD-10-CM | POA: Diagnosis not present

## 2023-03-04 DIAGNOSIS — E8721 Acute metabolic acidosis: Secondary | ICD-10-CM | POA: Diagnosis not present

## 2023-03-04 DIAGNOSIS — E875 Hyperkalemia: Secondary | ICD-10-CM | POA: Diagnosis not present

## 2023-03-04 DIAGNOSIS — G4700 Insomnia, unspecified: Secondary | ICD-10-CM | POA: Diagnosis not present

## 2023-03-04 DIAGNOSIS — I69398 Other sequelae of cerebral infarction: Secondary | ICD-10-CM | POA: Diagnosis not present

## 2023-03-04 DIAGNOSIS — M6281 Muscle weakness (generalized): Secondary | ICD-10-CM | POA: Diagnosis not present

## 2023-03-04 DIAGNOSIS — I10 Essential (primary) hypertension: Secondary | ICD-10-CM | POA: Diagnosis not present

## 2023-03-04 DIAGNOSIS — A419 Sepsis, unspecified organism: Secondary | ICD-10-CM | POA: Diagnosis not present

## 2023-03-05 DIAGNOSIS — I10 Essential (primary) hypertension: Secondary | ICD-10-CM | POA: Diagnosis not present

## 2023-03-05 DIAGNOSIS — I69398 Other sequelae of cerebral infarction: Secondary | ICD-10-CM | POA: Diagnosis not present

## 2023-03-05 DIAGNOSIS — G4700 Insomnia, unspecified: Secondary | ICD-10-CM | POA: Diagnosis not present

## 2023-03-05 DIAGNOSIS — Z794 Long term (current) use of insulin: Secondary | ICD-10-CM | POA: Diagnosis not present

## 2023-03-05 DIAGNOSIS — Z7984 Long term (current) use of oral hypoglycemic drugs: Secondary | ICD-10-CM | POA: Diagnosis not present

## 2023-03-05 DIAGNOSIS — L89222 Pressure ulcer of left hip, stage 2: Secondary | ICD-10-CM | POA: Diagnosis not present

## 2023-03-05 DIAGNOSIS — A419 Sepsis, unspecified organism: Secondary | ICD-10-CM | POA: Diagnosis not present

## 2023-03-05 DIAGNOSIS — M6281 Muscle weakness (generalized): Secondary | ICD-10-CM | POA: Diagnosis not present

## 2023-03-05 DIAGNOSIS — E1142 Type 2 diabetes mellitus with diabetic polyneuropathy: Secondary | ICD-10-CM | POA: Diagnosis not present

## 2023-03-06 DIAGNOSIS — N179 Acute kidney failure, unspecified: Secondary | ICD-10-CM | POA: Diagnosis not present

## 2023-03-06 DIAGNOSIS — I129 Hypertensive chronic kidney disease with stage 1 through stage 4 chronic kidney disease, or unspecified chronic kidney disease: Secondary | ICD-10-CM | POA: Diagnosis not present

## 2023-03-07 DIAGNOSIS — E1142 Type 2 diabetes mellitus with diabetic polyneuropathy: Secondary | ICD-10-CM | POA: Diagnosis not present

## 2023-03-07 DIAGNOSIS — Z794 Long term (current) use of insulin: Secondary | ICD-10-CM | POA: Diagnosis not present

## 2023-03-07 DIAGNOSIS — A419 Sepsis, unspecified organism: Secondary | ICD-10-CM | POA: Diagnosis not present

## 2023-03-07 DIAGNOSIS — I69398 Other sequelae of cerebral infarction: Secondary | ICD-10-CM | POA: Diagnosis not present

## 2023-03-07 DIAGNOSIS — I10 Essential (primary) hypertension: Secondary | ICD-10-CM | POA: Diagnosis not present

## 2023-03-07 DIAGNOSIS — R4182 Altered mental status, unspecified: Secondary | ICD-10-CM | POA: Diagnosis not present

## 2023-03-07 DIAGNOSIS — G4700 Insomnia, unspecified: Secondary | ICD-10-CM | POA: Diagnosis not present

## 2023-03-07 DIAGNOSIS — Z7984 Long term (current) use of oral hypoglycemic drugs: Secondary | ICD-10-CM | POA: Diagnosis not present

## 2023-03-10 DIAGNOSIS — Z7984 Long term (current) use of oral hypoglycemic drugs: Secondary | ICD-10-CM | POA: Diagnosis not present

## 2023-03-10 DIAGNOSIS — Z794 Long term (current) use of insulin: Secondary | ICD-10-CM | POA: Diagnosis not present

## 2023-03-10 DIAGNOSIS — E1142 Type 2 diabetes mellitus with diabetic polyneuropathy: Secondary | ICD-10-CM | POA: Diagnosis not present

## 2023-03-10 DIAGNOSIS — L89222 Pressure ulcer of left hip, stage 2: Secondary | ICD-10-CM | POA: Diagnosis not present

## 2023-03-10 DIAGNOSIS — I10 Essential (primary) hypertension: Secondary | ICD-10-CM | POA: Diagnosis not present

## 2023-03-10 DIAGNOSIS — I69398 Other sequelae of cerebral infarction: Secondary | ICD-10-CM | POA: Diagnosis not present

## 2023-03-10 DIAGNOSIS — A419 Sepsis, unspecified organism: Secondary | ICD-10-CM | POA: Diagnosis not present

## 2023-03-10 DIAGNOSIS — M6281 Muscle weakness (generalized): Secondary | ICD-10-CM | POA: Diagnosis not present

## 2023-03-12 DIAGNOSIS — I69398 Other sequelae of cerebral infarction: Secondary | ICD-10-CM | POA: Diagnosis not present

## 2023-03-12 DIAGNOSIS — R4182 Altered mental status, unspecified: Secondary | ICD-10-CM | POA: Diagnosis not present

## 2023-03-12 DIAGNOSIS — Z794 Long term (current) use of insulin: Secondary | ICD-10-CM | POA: Diagnosis not present

## 2023-03-12 DIAGNOSIS — F419 Anxiety disorder, unspecified: Secondary | ICD-10-CM | POA: Diagnosis not present

## 2023-03-12 DIAGNOSIS — G4700 Insomnia, unspecified: Secondary | ICD-10-CM | POA: Diagnosis not present

## 2023-03-12 DIAGNOSIS — E1142 Type 2 diabetes mellitus with diabetic polyneuropathy: Secondary | ICD-10-CM | POA: Diagnosis not present

## 2023-03-12 DIAGNOSIS — Z7984 Long term (current) use of oral hypoglycemic drugs: Secondary | ICD-10-CM | POA: Diagnosis not present

## 2023-03-12 DIAGNOSIS — A419 Sepsis, unspecified organism: Secondary | ICD-10-CM | POA: Diagnosis not present

## 2023-03-12 DIAGNOSIS — M6281 Muscle weakness (generalized): Secondary | ICD-10-CM | POA: Diagnosis not present

## 2023-03-17 DIAGNOSIS — W19XXXA Unspecified fall, initial encounter: Secondary | ICD-10-CM | POA: Diagnosis not present

## 2023-03-17 DIAGNOSIS — S41111A Laceration without foreign body of right upper arm, initial encounter: Secondary | ICD-10-CM | POA: Diagnosis not present

## 2023-03-21 DIAGNOSIS — Z794 Long term (current) use of insulin: Secondary | ICD-10-CM | POA: Diagnosis not present

## 2023-03-21 DIAGNOSIS — E1142 Type 2 diabetes mellitus with diabetic polyneuropathy: Secondary | ICD-10-CM | POA: Diagnosis not present

## 2023-03-21 DIAGNOSIS — R102 Pelvic and perineal pain: Secondary | ICD-10-CM | POA: Diagnosis not present

## 2023-03-21 DIAGNOSIS — S51012A Laceration without foreign body of left elbow, initial encounter: Secondary | ICD-10-CM | POA: Diagnosis not present

## 2023-03-21 DIAGNOSIS — W19XXXA Unspecified fall, initial encounter: Secondary | ICD-10-CM | POA: Diagnosis not present

## 2023-03-21 DIAGNOSIS — G4700 Insomnia, unspecified: Secondary | ICD-10-CM | POA: Diagnosis not present

## 2023-03-21 DIAGNOSIS — M25551 Pain in right hip: Secondary | ICD-10-CM | POA: Diagnosis not present

## 2023-03-21 DIAGNOSIS — F411 Generalized anxiety disorder: Secondary | ICD-10-CM | POA: Diagnosis not present

## 2023-03-21 DIAGNOSIS — F331 Major depressive disorder, recurrent, moderate: Secondary | ICD-10-CM | POA: Diagnosis not present

## 2023-03-21 DIAGNOSIS — M25521 Pain in right elbow: Secondary | ICD-10-CM | POA: Diagnosis not present

## 2023-03-21 DIAGNOSIS — I10 Essential (primary) hypertension: Secondary | ICD-10-CM | POA: Diagnosis not present

## 2023-03-21 DIAGNOSIS — Z7984 Long term (current) use of oral hypoglycemic drugs: Secondary | ICD-10-CM | POA: Diagnosis not present

## 2023-03-27 DIAGNOSIS — I10 Essential (primary) hypertension: Secondary | ICD-10-CM | POA: Diagnosis not present

## 2023-03-27 DIAGNOSIS — D649 Anemia, unspecified: Secondary | ICD-10-CM | POA: Diagnosis not present

## 2023-03-27 DIAGNOSIS — E119 Type 2 diabetes mellitus without complications: Secondary | ICD-10-CM | POA: Diagnosis not present

## 2023-03-27 DIAGNOSIS — Z79899 Other long term (current) drug therapy: Secondary | ICD-10-CM | POA: Diagnosis not present

## 2023-03-28 DIAGNOSIS — R451 Restlessness and agitation: Secondary | ICD-10-CM | POA: Diagnosis not present

## 2023-03-28 DIAGNOSIS — R350 Frequency of micturition: Secondary | ICD-10-CM | POA: Diagnosis not present

## 2023-03-28 DIAGNOSIS — M25551 Pain in right hip: Secondary | ICD-10-CM | POA: Diagnosis not present

## 2023-03-28 DIAGNOSIS — S51011A Laceration without foreign body of right elbow, initial encounter: Secondary | ICD-10-CM | POA: Diagnosis not present

## 2023-03-28 DIAGNOSIS — I129 Hypertensive chronic kidney disease with stage 1 through stage 4 chronic kidney disease, or unspecified chronic kidney disease: Secondary | ICD-10-CM | POA: Diagnosis not present

## 2023-03-28 DIAGNOSIS — N1831 Chronic kidney disease, stage 3a: Secondary | ICD-10-CM | POA: Diagnosis not present

## 2023-03-28 DIAGNOSIS — M6281 Muscle weakness (generalized): Secondary | ICD-10-CM | POA: Diagnosis not present

## 2023-03-28 DIAGNOSIS — W19XXXA Unspecified fall, initial encounter: Secondary | ICD-10-CM | POA: Diagnosis not present

## 2023-04-01 ENCOUNTER — Telehealth: Payer: Self-pay

## 2023-04-01 NOTE — Telephone Encounter (Signed)
Reached out to schedule patient for follow up.  Per wife, he is now in SNF.  Walter Harris [facility] Specialist

## 2023-04-08 DIAGNOSIS — M6281 Muscle weakness (generalized): Secondary | ICD-10-CM | POA: Diagnosis not present

## 2023-04-08 DIAGNOSIS — I69398 Other sequelae of cerebral infarction: Secondary | ICD-10-CM | POA: Diagnosis not present

## 2023-04-08 DIAGNOSIS — D519 Vitamin B12 deficiency anemia, unspecified: Secondary | ICD-10-CM | POA: Diagnosis not present

## 2023-04-08 DIAGNOSIS — N1831 Chronic kidney disease, stage 3a: Secondary | ICD-10-CM | POA: Diagnosis not present

## 2023-04-08 DIAGNOSIS — I129 Hypertensive chronic kidney disease with stage 1 through stage 4 chronic kidney disease, or unspecified chronic kidney disease: Secondary | ICD-10-CM | POA: Diagnosis not present

## 2023-04-08 DIAGNOSIS — E1142 Type 2 diabetes mellitus with diabetic polyneuropathy: Secondary | ICD-10-CM | POA: Diagnosis not present

## 2023-04-09 DIAGNOSIS — F331 Major depressive disorder, recurrent, moderate: Secondary | ICD-10-CM | POA: Diagnosis not present

## 2023-04-09 DIAGNOSIS — F411 Generalized anxiety disorder: Secondary | ICD-10-CM | POA: Diagnosis not present

## 2023-04-23 DIAGNOSIS — E1142 Type 2 diabetes mellitus with diabetic polyneuropathy: Secondary | ICD-10-CM | POA: Diagnosis not present

## 2023-04-23 DIAGNOSIS — Z7984 Long term (current) use of oral hypoglycemic drugs: Secondary | ICD-10-CM | POA: Diagnosis not present

## 2023-04-23 DIAGNOSIS — R296 Repeated falls: Secondary | ICD-10-CM | POA: Diagnosis not present

## 2023-04-23 DIAGNOSIS — Z794 Long term (current) use of insulin: Secondary | ICD-10-CM | POA: Diagnosis not present

## 2023-04-23 DIAGNOSIS — I129 Hypertensive chronic kidney disease with stage 1 through stage 4 chronic kidney disease, or unspecified chronic kidney disease: Secondary | ICD-10-CM | POA: Diagnosis not present

## 2023-04-23 DIAGNOSIS — N1831 Chronic kidney disease, stage 3a: Secondary | ICD-10-CM | POA: Diagnosis not present

## 2023-05-02 DIAGNOSIS — Z79899 Other long term (current) drug therapy: Secondary | ICD-10-CM | POA: Diagnosis not present

## 2023-05-05 DIAGNOSIS — D519 Vitamin B12 deficiency anemia, unspecified: Secondary | ICD-10-CM | POA: Diagnosis not present

## 2023-05-05 DIAGNOSIS — E1142 Type 2 diabetes mellitus with diabetic polyneuropathy: Secondary | ICD-10-CM | POA: Diagnosis not present

## 2023-05-06 DIAGNOSIS — I129 Hypertensive chronic kidney disease with stage 1 through stage 4 chronic kidney disease, or unspecified chronic kidney disease: Secondary | ICD-10-CM | POA: Diagnosis not present

## 2023-05-06 DIAGNOSIS — M6281 Muscle weakness (generalized): Secondary | ICD-10-CM | POA: Diagnosis not present

## 2023-05-06 DIAGNOSIS — N1831 Chronic kidney disease, stage 3a: Secondary | ICD-10-CM | POA: Diagnosis not present

## 2023-05-06 DIAGNOSIS — I69398 Other sequelae of cerebral infarction: Secondary | ICD-10-CM | POA: Diagnosis not present

## 2023-05-07 DIAGNOSIS — F411 Generalized anxiety disorder: Secondary | ICD-10-CM | POA: Diagnosis not present

## 2023-05-07 DIAGNOSIS — I739 Peripheral vascular disease, unspecified: Secondary | ICD-10-CM | POA: Diagnosis not present

## 2023-05-07 DIAGNOSIS — L603 Nail dystrophy: Secondary | ICD-10-CM | POA: Diagnosis not present

## 2023-05-07 DIAGNOSIS — F331 Major depressive disorder, recurrent, moderate: Secondary | ICD-10-CM | POA: Diagnosis not present

## 2023-05-16 DIAGNOSIS — F411 Generalized anxiety disorder: Secondary | ICD-10-CM | POA: Diagnosis not present

## 2023-05-16 DIAGNOSIS — F331 Major depressive disorder, recurrent, moderate: Secondary | ICD-10-CM | POA: Diagnosis not present

## 2023-05-22 DIAGNOSIS — Z8673 Personal history of transient ischemic attack (TIA), and cerebral infarction without residual deficits: Secondary | ICD-10-CM | POA: Diagnosis not present

## 2023-05-22 DIAGNOSIS — E559 Vitamin D deficiency, unspecified: Secondary | ICD-10-CM | POA: Diagnosis not present

## 2023-05-22 DIAGNOSIS — E538 Deficiency of other specified B group vitamins: Secondary | ICD-10-CM | POA: Diagnosis not present

## 2023-05-22 DIAGNOSIS — Z7901 Long term (current) use of anticoagulants: Secondary | ICD-10-CM | POA: Diagnosis not present

## 2023-05-29 DIAGNOSIS — E1142 Type 2 diabetes mellitus with diabetic polyneuropathy: Secondary | ICD-10-CM | POA: Diagnosis not present

## 2023-05-29 DIAGNOSIS — M6281 Muscle weakness (generalized): Secondary | ICD-10-CM | POA: Diagnosis not present

## 2023-05-29 DIAGNOSIS — N1831 Chronic kidney disease, stage 3a: Secondary | ICD-10-CM | POA: Diagnosis not present

## 2023-05-29 DIAGNOSIS — D519 Vitamin B12 deficiency anemia, unspecified: Secondary | ICD-10-CM | POA: Diagnosis not present

## 2023-05-29 DIAGNOSIS — I129 Hypertensive chronic kidney disease with stage 1 through stage 4 chronic kidney disease, or unspecified chronic kidney disease: Secondary | ICD-10-CM | POA: Diagnosis not present

## 2023-05-29 DIAGNOSIS — I69398 Other sequelae of cerebral infarction: Secondary | ICD-10-CM | POA: Diagnosis not present

## 2023-06-04 DIAGNOSIS — F331 Major depressive disorder, recurrent, moderate: Secondary | ICD-10-CM | POA: Diagnosis not present

## 2023-06-04 DIAGNOSIS — F411 Generalized anxiety disorder: Secondary | ICD-10-CM | POA: Diagnosis not present

## 2023-06-25 DIAGNOSIS — I129 Hypertensive chronic kidney disease with stage 1 through stage 4 chronic kidney disease, or unspecified chronic kidney disease: Secondary | ICD-10-CM | POA: Diagnosis not present

## 2023-06-25 DIAGNOSIS — Z7984 Long term (current) use of oral hypoglycemic drugs: Secondary | ICD-10-CM | POA: Diagnosis not present

## 2023-06-25 DIAGNOSIS — N1831 Chronic kidney disease, stage 3a: Secondary | ICD-10-CM | POA: Diagnosis not present

## 2023-06-25 DIAGNOSIS — Z794 Long term (current) use of insulin: Secondary | ICD-10-CM | POA: Diagnosis not present

## 2023-06-25 DIAGNOSIS — E1142 Type 2 diabetes mellitus with diabetic polyneuropathy: Secondary | ICD-10-CM | POA: Diagnosis not present

## 2023-07-07 DIAGNOSIS — Z794 Long term (current) use of insulin: Secondary | ICD-10-CM | POA: Diagnosis not present

## 2023-07-07 DIAGNOSIS — E1151 Type 2 diabetes mellitus with diabetic peripheral angiopathy without gangrene: Secondary | ICD-10-CM | POA: Diagnosis not present

## 2023-07-07 DIAGNOSIS — L603 Nail dystrophy: Secondary | ICD-10-CM | POA: Diagnosis not present

## 2023-07-07 DIAGNOSIS — L602 Onychogryphosis: Secondary | ICD-10-CM | POA: Diagnosis not present

## 2023-07-07 DIAGNOSIS — L84 Corns and callosities: Secondary | ICD-10-CM | POA: Diagnosis not present

## 2023-07-08 DIAGNOSIS — F411 Generalized anxiety disorder: Secondary | ICD-10-CM | POA: Diagnosis not present

## 2023-07-08 DIAGNOSIS — F331 Major depressive disorder, recurrent, moderate: Secondary | ICD-10-CM | POA: Diagnosis not present

## 2023-07-09 DIAGNOSIS — E1165 Type 2 diabetes mellitus with hyperglycemia: Secondary | ICD-10-CM | POA: Diagnosis not present

## 2023-07-09 DIAGNOSIS — D509 Iron deficiency anemia, unspecified: Secondary | ICD-10-CM | POA: Diagnosis not present

## 2023-07-18 DIAGNOSIS — G4709 Other insomnia: Secondary | ICD-10-CM | POA: Diagnosis not present

## 2023-07-18 DIAGNOSIS — N400 Enlarged prostate without lower urinary tract symptoms: Secondary | ICD-10-CM | POA: Diagnosis not present

## 2023-07-18 DIAGNOSIS — D509 Iron deficiency anemia, unspecified: Secondary | ICD-10-CM | POA: Diagnosis not present

## 2023-07-18 DIAGNOSIS — E1165 Type 2 diabetes mellitus with hyperglycemia: Secondary | ICD-10-CM | POA: Diagnosis not present

## 2023-07-29 DIAGNOSIS — E1165 Type 2 diabetes mellitus with hyperglycemia: Secondary | ICD-10-CM | POA: Diagnosis not present

## 2023-07-29 DIAGNOSIS — D509 Iron deficiency anemia, unspecified: Secondary | ICD-10-CM | POA: Diagnosis not present

## 2023-08-02 DIAGNOSIS — I69398 Other sequelae of cerebral infarction: Secondary | ICD-10-CM | POA: Diagnosis not present

## 2023-08-02 DIAGNOSIS — I129 Hypertensive chronic kidney disease with stage 1 through stage 4 chronic kidney disease, or unspecified chronic kidney disease: Secondary | ICD-10-CM | POA: Diagnosis not present

## 2023-08-02 DIAGNOSIS — M6281 Muscle weakness (generalized): Secondary | ICD-10-CM | POA: Diagnosis not present

## 2023-08-02 DIAGNOSIS — N1831 Chronic kidney disease, stage 3a: Secondary | ICD-10-CM | POA: Diagnosis not present

## 2023-08-06 DIAGNOSIS — F411 Generalized anxiety disorder: Secondary | ICD-10-CM | POA: Diagnosis not present

## 2023-08-06 DIAGNOSIS — F331 Major depressive disorder, recurrent, moderate: Secondary | ICD-10-CM | POA: Diagnosis not present

## 2023-08-11 DIAGNOSIS — G894 Chronic pain syndrome: Secondary | ICD-10-CM | POA: Diagnosis not present

## 2023-08-11 DIAGNOSIS — M25551 Pain in right hip: Secondary | ICD-10-CM | POA: Diagnosis not present

## 2023-08-12 DIAGNOSIS — E1165 Type 2 diabetes mellitus with hyperglycemia: Secondary | ICD-10-CM | POA: Diagnosis not present

## 2023-08-12 DIAGNOSIS — N390 Urinary tract infection, site not specified: Secondary | ICD-10-CM | POA: Diagnosis not present

## 2023-08-13 DIAGNOSIS — D649 Anemia, unspecified: Secondary | ICD-10-CM | POA: Diagnosis not present

## 2023-08-13 DIAGNOSIS — N390 Urinary tract infection, site not specified: Secondary | ICD-10-CM | POA: Diagnosis not present

## 2023-08-14 DIAGNOSIS — N390 Urinary tract infection, site not specified: Secondary | ICD-10-CM | POA: Diagnosis not present

## 2023-08-15 ENCOUNTER — Inpatient Hospital Stay: Payer: PPO

## 2023-08-15 ENCOUNTER — Encounter: Payer: Self-pay | Admitting: Emergency Medicine

## 2023-08-15 ENCOUNTER — Other Ambulatory Visit: Payer: Self-pay

## 2023-08-15 ENCOUNTER — Emergency Department: Payer: PPO

## 2023-08-15 ENCOUNTER — Inpatient Hospital Stay
Admission: EM | Admit: 2023-08-15 | Discharge: 2023-08-20 | DRG: 064 | Disposition: A | Discharge status: Other Acute Inpatient Hospital | Payer: PPO | Source: Skilled Nursing Facility | Attending: Internal Medicine | Admitting: Internal Medicine

## 2023-08-15 DIAGNOSIS — Z9049 Acquired absence of other specified parts of digestive tract: Secondary | ICD-10-CM

## 2023-08-15 DIAGNOSIS — J9811 Atelectasis: Secondary | ICD-10-CM | POA: Diagnosis present

## 2023-08-15 DIAGNOSIS — R9082 White matter disease, unspecified: Secondary | ICD-10-CM | POA: Diagnosis not present

## 2023-08-15 DIAGNOSIS — Z6826 Body mass index (BMI) 26.0-26.9, adult: Secondary | ICD-10-CM

## 2023-08-15 DIAGNOSIS — I2510 Atherosclerotic heart disease of native coronary artery without angina pectoris: Secondary | ICD-10-CM | POA: Diagnosis not present

## 2023-08-15 DIAGNOSIS — E119 Type 2 diabetes mellitus without complications: Secondary | ICD-10-CM

## 2023-08-15 DIAGNOSIS — G936 Cerebral edema: Secondary | ICD-10-CM | POA: Diagnosis present

## 2023-08-15 DIAGNOSIS — Z882 Allergy status to sulfonamides status: Secondary | ICD-10-CM

## 2023-08-15 DIAGNOSIS — E872 Acidosis, unspecified: Secondary | ICD-10-CM | POA: Diagnosis not present

## 2023-08-15 DIAGNOSIS — Z66 Do not resuscitate: Secondary | ICD-10-CM | POA: Diagnosis present

## 2023-08-15 DIAGNOSIS — G9341 Metabolic encephalopathy: Secondary | ICD-10-CM | POA: Diagnosis not present

## 2023-08-15 DIAGNOSIS — F429 Obsessive-compulsive disorder, unspecified: Secondary | ICD-10-CM | POA: Diagnosis present

## 2023-08-15 DIAGNOSIS — Z794 Long term (current) use of insulin: Secondary | ICD-10-CM

## 2023-08-15 DIAGNOSIS — R0989 Other specified symptoms and signs involving the circulatory and respiratory systems: Secondary | ICD-10-CM | POA: Diagnosis not present

## 2023-08-15 DIAGNOSIS — Z87892 Personal history of anaphylaxis: Secondary | ICD-10-CM

## 2023-08-15 DIAGNOSIS — J189 Pneumonia, unspecified organism: Secondary | ICD-10-CM

## 2023-08-15 DIAGNOSIS — Z885 Allergy status to narcotic agent status: Secondary | ICD-10-CM

## 2023-08-15 DIAGNOSIS — I611 Nontraumatic intracerebral hemorrhage in hemisphere, cortical: Principal | ICD-10-CM | POA: Diagnosis present

## 2023-08-15 DIAGNOSIS — H5347 Heteronymous bilateral field defects: Secondary | ICD-10-CM | POA: Diagnosis present

## 2023-08-15 DIAGNOSIS — I69320 Aphasia following cerebral infarction: Secondary | ICD-10-CM

## 2023-08-15 DIAGNOSIS — Z9089 Acquired absence of other organs: Secondary | ICD-10-CM

## 2023-08-15 DIAGNOSIS — E871 Hypo-osmolality and hyponatremia: Secondary | ICD-10-CM | POA: Diagnosis present

## 2023-08-15 DIAGNOSIS — R4182 Altered mental status, unspecified: Secondary | ICD-10-CM

## 2023-08-15 DIAGNOSIS — Z79899 Other long term (current) drug therapy: Secondary | ICD-10-CM | POA: Diagnosis not present

## 2023-08-15 DIAGNOSIS — F0394 Unspecified dementia, unspecified severity, with anxiety: Secondary | ICD-10-CM | POA: Diagnosis not present

## 2023-08-15 DIAGNOSIS — I619 Nontraumatic intracerebral hemorrhage, unspecified: Secondary | ICD-10-CM

## 2023-08-15 DIAGNOSIS — I69398 Other sequelae of cerebral infarction: Secondary | ICD-10-CM | POA: Diagnosis not present

## 2023-08-15 DIAGNOSIS — A419 Sepsis, unspecified organism: Secondary | ICD-10-CM | POA: Diagnosis not present

## 2023-08-15 DIAGNOSIS — I2489 Other forms of acute ischemic heart disease: Secondary | ICD-10-CM | POA: Diagnosis not present

## 2023-08-15 DIAGNOSIS — Z888 Allergy status to other drugs, medicaments and biological substances status: Secondary | ICD-10-CM

## 2023-08-15 DIAGNOSIS — I672 Cerebral atherosclerosis: Secondary | ICD-10-CM | POA: Diagnosis not present

## 2023-08-15 DIAGNOSIS — Z860100 Personal history of colon polyps, unspecified: Secondary | ICD-10-CM

## 2023-08-15 DIAGNOSIS — E43 Unspecified severe protein-calorie malnutrition: Secondary | ICD-10-CM | POA: Diagnosis present

## 2023-08-15 DIAGNOSIS — Z7982 Long term (current) use of aspirin: Secondary | ICD-10-CM

## 2023-08-15 DIAGNOSIS — Z8261 Family history of arthritis: Secondary | ICD-10-CM

## 2023-08-15 DIAGNOSIS — Z515 Encounter for palliative care: Secondary | ICD-10-CM | POA: Diagnosis not present

## 2023-08-15 DIAGNOSIS — E854 Organ-limited amyloidosis: Secondary | ICD-10-CM | POA: Diagnosis not present

## 2023-08-15 DIAGNOSIS — I69322 Dysarthria following cerebral infarction: Secondary | ICD-10-CM

## 2023-08-15 DIAGNOSIS — Z7984 Long term (current) use of oral hypoglycemic drugs: Secondary | ICD-10-CM | POA: Diagnosis not present

## 2023-08-15 DIAGNOSIS — E8881 Metabolic syndrome: Secondary | ICD-10-CM | POA: Diagnosis present

## 2023-08-15 DIAGNOSIS — G9389 Other specified disorders of brain: Secondary | ICD-10-CM | POA: Diagnosis not present

## 2023-08-15 DIAGNOSIS — Z1389 Encounter for screening for other disorder: Secondary | ICD-10-CM | POA: Diagnosis not present

## 2023-08-15 DIAGNOSIS — Z961 Presence of intraocular lens: Secondary | ICD-10-CM | POA: Diagnosis present

## 2023-08-15 DIAGNOSIS — R7989 Other specified abnormal findings of blood chemistry: Secondary | ICD-10-CM | POA: Diagnosis not present

## 2023-08-15 DIAGNOSIS — Z9842 Cataract extraction status, left eye: Secondary | ICD-10-CM

## 2023-08-15 DIAGNOSIS — A4181 Sepsis due to Enterococcus: Principal | ICD-10-CM | POA: Diagnosis present

## 2023-08-15 DIAGNOSIS — F0390 Unspecified dementia without behavioral disturbance: Secondary | ICD-10-CM | POA: Insufficient documentation

## 2023-08-15 DIAGNOSIS — Z634 Disappearance and death of family member: Secondary | ICD-10-CM

## 2023-08-15 DIAGNOSIS — Z825 Family history of asthma and other chronic lower respiratory diseases: Secondary | ICD-10-CM

## 2023-08-15 DIAGNOSIS — Z91030 Bee allergy status: Secondary | ICD-10-CM

## 2023-08-15 DIAGNOSIS — Z9889 Other specified postprocedural states: Secondary | ICD-10-CM

## 2023-08-15 DIAGNOSIS — R509 Fever, unspecified: Secondary | ICD-10-CM

## 2023-08-15 DIAGNOSIS — N390 Urinary tract infection, site not specified: Secondary | ICD-10-CM | POA: Diagnosis not present

## 2023-08-15 DIAGNOSIS — R918 Other nonspecific abnormal finding of lung field: Secondary | ICD-10-CM | POA: Diagnosis not present

## 2023-08-15 DIAGNOSIS — E785 Hyperlipidemia, unspecified: Secondary | ICD-10-CM | POA: Diagnosis not present

## 2023-08-15 DIAGNOSIS — Z9841 Cataract extraction status, right eye: Secondary | ICD-10-CM

## 2023-08-15 DIAGNOSIS — N179 Acute kidney failure, unspecified: Secondary | ICD-10-CM | POA: Diagnosis not present

## 2023-08-15 DIAGNOSIS — E1165 Type 2 diabetes mellitus with hyperglycemia: Secondary | ICD-10-CM | POA: Diagnosis present

## 2023-08-15 DIAGNOSIS — I63512 Cerebral infarction due to unspecified occlusion or stenosis of left middle cerebral artery: Secondary | ICD-10-CM | POA: Diagnosis not present

## 2023-08-15 DIAGNOSIS — Z974 Presence of external hearing-aid: Secondary | ICD-10-CM

## 2023-08-15 DIAGNOSIS — Z981 Arthrodesis status: Secondary | ICD-10-CM

## 2023-08-15 DIAGNOSIS — I10 Essential (primary) hypertension: Secondary | ICD-10-CM | POA: Diagnosis not present

## 2023-08-15 DIAGNOSIS — Z8619 Personal history of other infectious and parasitic diseases: Secondary | ICD-10-CM

## 2023-08-15 DIAGNOSIS — R000 Tachycardia, unspecified: Secondary | ICD-10-CM | POA: Diagnosis not present

## 2023-08-15 DIAGNOSIS — Z809 Family history of malignant neoplasm, unspecified: Secondary | ICD-10-CM

## 2023-08-15 DIAGNOSIS — Z818 Family history of other mental and behavioral disorders: Secondary | ICD-10-CM

## 2023-08-15 DIAGNOSIS — Z85828 Personal history of other malignant neoplasm of skin: Secondary | ICD-10-CM

## 2023-08-15 DIAGNOSIS — Z9181 History of falling: Secondary | ICD-10-CM

## 2023-08-15 DIAGNOSIS — H9193 Unspecified hearing loss, bilateral: Secondary | ICD-10-CM | POA: Diagnosis present

## 2023-08-15 DIAGNOSIS — Z886 Allergy status to analgesic agent status: Secondary | ICD-10-CM

## 2023-08-15 DIAGNOSIS — R296 Repeated falls: Secondary | ICD-10-CM | POA: Diagnosis present

## 2023-08-15 DIAGNOSIS — N3000 Acute cystitis without hematuria: Secondary | ICD-10-CM | POA: Diagnosis not present

## 2023-08-15 DIAGNOSIS — M47816 Spondylosis without myelopathy or radiculopathy, lumbar region: Secondary | ICD-10-CM | POA: Diagnosis present

## 2023-08-15 DIAGNOSIS — Z887 Allergy status to serum and vaccine status: Secondary | ICD-10-CM

## 2023-08-15 DIAGNOSIS — G250 Essential tremor: Secondary | ICD-10-CM | POA: Diagnosis present

## 2023-08-15 DIAGNOSIS — Z87442 Personal history of urinary calculi: Secondary | ICD-10-CM

## 2023-08-15 DIAGNOSIS — Z87891 Personal history of nicotine dependence: Secondary | ICD-10-CM

## 2023-08-15 DIAGNOSIS — Z83511 Family history of glaucoma: Secondary | ICD-10-CM

## 2023-08-15 LAB — CBC WITH DIFFERENTIAL/PLATELET
Abs Immature Granulocytes: 0.06 10*3/uL (ref 0.00–0.07)
Basophils Absolute: 0 10*3/uL (ref 0.0–0.1)
Basophils Relative: 0 %
Eosinophils Absolute: 0 10*3/uL (ref 0.0–0.5)
Eosinophils Relative: 0 %
HCT: 37.1 % — ABNORMAL LOW (ref 39.0–52.0)
Hemoglobin: 12.7 g/dL — ABNORMAL LOW (ref 13.0–17.0)
Immature Granulocytes: 1 %
Lymphocytes Relative: 4 %
Lymphs Abs: 0.3 10*3/uL — ABNORMAL LOW (ref 0.7–4.0)
MCH: 30.2 pg (ref 26.0–34.0)
MCHC: 34.2 g/dL (ref 30.0–36.0)
MCV: 88.1 fL (ref 80.0–100.0)
Monocytes Absolute: 0.6 10*3/uL (ref 0.1–1.0)
Monocytes Relative: 7 %
Neutro Abs: 7.5 10*3/uL (ref 1.7–7.7)
Neutrophils Relative %: 88 %
Platelets: 221 10*3/uL (ref 150–400)
RBC: 4.21 MIL/uL — ABNORMAL LOW (ref 4.22–5.81)
RDW: 13.5 % (ref 11.5–15.5)
WBC: 8.5 10*3/uL (ref 4.0–10.5)
nRBC: 0 % (ref 0.0–0.2)

## 2023-08-15 LAB — BASIC METABOLIC PANEL
Anion gap: 9 (ref 5–15)
BUN: 16 mg/dL (ref 8–23)
CO2: 20 mmol/L — ABNORMAL LOW (ref 22–32)
Calcium: 7.8 mg/dL — ABNORMAL LOW (ref 8.9–10.3)
Chloride: 107 mmol/L (ref 98–111)
Creatinine, Ser: 1.12 mg/dL (ref 0.61–1.24)
GFR, Estimated: >60 mL/min (ref >60)
Glucose, Bld: 128 mg/dL — ABNORMAL HIGH (ref 70–99)
Potassium: 3.6 mmol/L (ref 3.5–5.1)
Sodium: 136 mmol/L (ref 135–145)

## 2023-08-15 LAB — COMPREHENSIVE METABOLIC PANEL
ALT: 15 U/L (ref 0–44)
AST: 16 U/L (ref 15–41)
Albumin: 3.5 g/dL (ref 3.5–5.0)
Alkaline Phosphatase: 81 U/L (ref 38–126)
Anion gap: 10 (ref 5–15)
BUN: 18 mg/dL (ref 8–23)
CO2: 20 mmol/L — ABNORMAL LOW (ref 22–32)
Calcium: 8.9 mg/dL (ref 8.9–10.3)
Chloride: 104 mmol/L (ref 98–111)
Creatinine, Ser: 1.32 mg/dL — ABNORMAL HIGH (ref 0.61–1.24)
GFR, Estimated: 56 mL/min — ABNORMAL LOW (ref >60)
Glucose, Bld: 230 mg/dL — ABNORMAL HIGH (ref 70–99)
Potassium: 3.7 mmol/L (ref 3.5–5.1)
Sodium: 134 mmol/L — ABNORMAL LOW (ref 135–145)
Total Bilirubin: 0.7 mg/dL (ref <1.2)
Total Protein: 6.8 g/dL (ref 6.5–8.1)

## 2023-08-15 LAB — URINALYSIS, W/ REFLEX TO CULTURE (INFECTION SUSPECTED)
Bacteria, UA: NONE SEEN
Bilirubin Urine: NEGATIVE
Glucose, UA: >=500 mg/dL — AB
Ketones, ur: 5 mg/dL — AB
Nitrite: NEGATIVE
Protein, ur: 100 mg/dL — AB
Specific Gravity, Urine: 1.012 (ref 1.005–1.030)
Squamous Epithelial / HPF: 0 /[HPF] (ref 0–5)
WBC, UA: >50 WBC/hpf (ref 0–5)
pH: 6 (ref 5.0–8.0)

## 2023-08-15 LAB — HEMOGLOBIN A1C
Hgb A1c MFr Bld: 10.8 % — ABNORMAL HIGH (ref 4.8–5.6)
Mean Plasma Glucose: 263.26 mg/dL

## 2023-08-15 LAB — GLUCOSE, CAPILLARY
Glucose-Capillary: 210 mg/dL — ABNORMAL HIGH (ref 70–99)
Glucose-Capillary: 168 mg/dL — ABNORMAL HIGH (ref 70–99)
Glucose-Capillary: 125 mg/dL — ABNORMAL HIGH (ref 70–99)
Glucose-Capillary: 105 mg/dL — ABNORMAL HIGH (ref 70–99)
Glucose-Capillary: 133 mg/dL — ABNORMAL HIGH (ref 70–99)
Glucose-Capillary: 109 mg/dL — ABNORMAL HIGH (ref 70–99)

## 2023-08-15 LAB — LIPID PANEL
Cholesterol: 129 mg/dL (ref 0–200)
HDL: 36 mg/dL — ABNORMAL LOW (ref >40)
LDL Cholesterol: 79 mg/dL (ref 0–99)
Total CHOL/HDL Ratio: 3.6 {ratio}
Triglycerides: 68 mg/dL (ref <150)
VLDL: 14 mg/dL (ref 0–40)

## 2023-08-15 LAB — CBG MONITORING, ED: Glucose-Capillary: 200 mg/dL — ABNORMAL HIGH (ref 70–99)

## 2023-08-15 LAB — TROPONIN I (HIGH SENSITIVITY)
Troponin I (High Sensitivity): 35 ng/L — ABNORMAL HIGH (ref <18)
Troponin I (High Sensitivity): 38 ng/L — ABNORMAL HIGH (ref <18)

## 2023-08-15 LAB — PROTIME-INR
INR: 1.1 (ref 0.8–1.2)
Prothrombin Time: 14.1 s (ref 11.4–15.2)

## 2023-08-15 LAB — SARS CORONAVIRUS 2 BY RT PCR: SARS Coronavirus 2 by RT PCR: NEGATIVE

## 2023-08-15 LAB — PHOSPHORUS: Phosphorus: 3.2 mg/dL (ref 2.5–4.6)

## 2023-08-15 LAB — MRSA NEXT GEN BY PCR, NASAL: MRSA by PCR Next Gen: NOT DETECTED

## 2023-08-15 LAB — PROCALCITONIN: Procalcitonin: 0.2 ng/mL

## 2023-08-15 LAB — MAGNESIUM: Magnesium: 1.6 mg/dL — ABNORMAL LOW (ref 1.7–2.4)

## 2023-08-15 LAB — LACTIC ACID, PLASMA: Lactic Acid, Venous: 1.6 mmol/L (ref 0.5–1.9)

## 2023-08-15 MED ORDER — LEVETIRACETAM IN NACL 500 MG/100ML IV SOLN
500.0000 mg | Freq: Two times a day (BID) | INTRAVENOUS | Status: DC
Start: 2023-08-15 — End: 2023-08-19
  Administered 2023-08-15 – 2023-08-19 (×8): 500 mg via INTRAVENOUS
  Filled 2023-08-15 (×11): qty 100

## 2023-08-15 MED ORDER — LABETALOL HCL 5 MG/ML IV SOLN: 10.0000 mg | INTRAVENOUS | Status: DC | PRN

## 2023-08-15 MED ORDER — ACETAMINOPHEN 650 MG RE SUPP
650.0000 mg | RECTAL | Status: DC | PRN
Start: 2023-08-15 — End: 2023-08-17

## 2023-08-15 MED ORDER — AZITHROMYCIN 500 MG IV SOLR
500.0000 mg | Freq: Once | INTRAVENOUS | Status: AC
  Administered 2023-08-15: 500 mg via INTRAVENOUS
  Filled 2023-08-15: qty 5

## 2023-08-15 MED ORDER — ACETAMINOPHEN 325 MG PO TABS: 650.0000 mg | ORAL_TABLET | ORAL | Status: DC | PRN

## 2023-08-15 MED ORDER — SENNOSIDES-DOCUSATE SODIUM 8.6-50 MG PO TABS
1.0000 | ORAL_TABLET | Freq: Two times a day (BID) | ORAL | Status: DC
  Administered 2023-08-15 – 2023-08-19 (×5): 1 via ORAL
  Filled 2023-08-15 (×6): qty 1

## 2023-08-15 MED ORDER — SODIUM CHLORIDE 0.9 % IV BOLUS (SEPSIS)
500.0000 mL | Freq: Once | INTRAVENOUS | Status: AC
  Administered 2023-08-15: 500 mL via INTRAVENOUS

## 2023-08-15 MED ORDER — MAGNESIUM SULFATE 2 GM/50ML IV SOLN
2.0000 g | Freq: Once | INTRAVENOUS | Status: AC
  Administered 2023-08-15: 2 g via INTRAVENOUS
  Filled 2023-08-15: qty 50

## 2023-08-15 MED ORDER — TRAZODONE HCL 50 MG PO TABS
75.0000 mg | ORAL_TABLET | Freq: Every evening | ORAL | Status: DC | PRN
  Administered 2023-08-15: 75 mg via ORAL
  Filled 2023-08-15: qty 2

## 2023-08-15 MED ORDER — ACETAMINOPHEN 160 MG/5ML PO SOLN: 650.0000 mg | ORAL | Status: DC | PRN

## 2023-08-15 MED ORDER — CHLORHEXIDINE GLUCONATE CLOTH 2 % EX PADS
6.0000 | MEDICATED_PAD | Freq: Every day | CUTANEOUS | Status: DC
  Administered 2023-08-15 – 2023-08-18 (×4): 6 via TOPICAL
  Filled 2023-08-15: qty 6

## 2023-08-15 MED ORDER — POLYETHYLENE GLYCOL 3350 17 G PO PACK
17.0000 g | PACK | Freq: Every day | ORAL | Status: DC | PRN
  Administered 2023-08-19: 17 g via ORAL
  Filled 2023-08-15: qty 1

## 2023-08-15 MED ORDER — INSULIN ASPART 100 UNIT/ML IJ SOLN
0.0000 [IU] | INTRAMUSCULAR | Status: DC
  Administered 2023-08-15 (×2): 2 [IU] via SUBCUTANEOUS
  Administered 2023-08-15: 5 [IU] via SUBCUTANEOUS
  Administered 2023-08-15 – 2023-08-16 (×2): 3 [IU] via SUBCUTANEOUS
  Administered 2023-08-16: 2 [IU] via SUBCUTANEOUS
  Administered 2023-08-17: 3 [IU] via SUBCUTANEOUS
  Administered 2023-08-17: 2 [IU] via SUBCUTANEOUS
  Administered 2023-08-17: 5 [IU] via SUBCUTANEOUS
  Administered 2023-08-17: 11 [IU] via SUBCUTANEOUS
  Administered 2023-08-17: 8 [IU] via SUBCUTANEOUS
  Administered 2023-08-18: 3 [IU] via SUBCUTANEOUS
  Administered 2023-08-18: 8 [IU] via SUBCUTANEOUS
  Administered 2023-08-18: 5 [IU] via SUBCUTANEOUS
  Administered 2023-08-19 (×2): 3 [IU] via SUBCUTANEOUS
  Filled 2023-08-15 (×14): qty 1

## 2023-08-15 MED ORDER — SODIUM CHLORIDE 0.9 % IV BOLUS (SEPSIS)
1000.0000 mL | Freq: Once | INTRAVENOUS | Status: AC
  Administered 2023-08-15: 1000 mL via INTRAVENOUS

## 2023-08-15 MED ORDER — IOHEXOL 350 MG/ML SOLN
75.0000 mL | Freq: Once | INTRAVENOUS | Status: AC | PRN
  Administered 2023-08-15: 75 mL via INTRAVENOUS

## 2023-08-15 MED ORDER — SODIUM CHLORIDE 0.9 % IV SOLN
2.0000 g | INTRAVENOUS | Status: DC
  Administered 2023-08-15 – 2023-08-17 (×3): 2 g via INTRAVENOUS
  Filled 2023-08-15 (×3): qty 20

## 2023-08-15 MED ORDER — PANTOPRAZOLE SODIUM 40 MG IV SOLR
40.0000 mg | Freq: Every day | INTRAVENOUS | Status: DC
  Administered 2023-08-15 – 2023-08-16 (×2): 40 mg via INTRAVENOUS
  Filled 2023-08-15 (×2): qty 10

## 2023-08-15 MED ORDER — ACETAMINOPHEN 325 MG RE SUPP
975.0000 mg | Freq: Once | RECTAL | Status: AC
  Administered 2023-08-15: 975 mg via RECTAL
  Filled 2023-08-15: qty 3

## 2023-08-15 MED ORDER — STROKE: EARLY STAGES OF RECOVERY BOOK: Freq: Once | Status: AC

## 2023-08-15 MED ORDER — LEVETIRACETAM IN NACL 1000 MG/100ML IV SOLN
1000.0000 mg | Freq: Once | INTRAVENOUS | Status: AC
  Administered 2023-08-15: 1000 mg via INTRAVENOUS
  Filled 2023-08-15: qty 100

## 2023-08-15 MED ORDER — DOCUSATE SODIUM 100 MG PO CAPS
100.0000 mg | ORAL_CAPSULE | Freq: Two times a day (BID) | ORAL | Status: DC | PRN
  Filled 2023-08-15: qty 1

## 2023-08-15 NOTE — ED Provider Notes (Signed)
[facility] Provider Note    Event Date/Time   First MD Initiated Contact with Patient 08/15/23 0117     (approximate)   History   Fever   HPI  Level V caveat: Limited by decreased LOC  Walter Harris is a 77 y.o. male brought to the ED via EMS from [employer] with a chief complaint of altered mental status.  History of dementia.  Recently diagnosed with UTI.  EMS reports fever.  Rest of history is limited secondary to patient's decreased LOC.  Past Medical History   Past Medical History:  Diagnosis Date   Basal cell carcinoma    multiple   Cataracts, both eyes 2019   surgery pending April 2020   Complication of anesthesia    post-operative cognitive dysfunction after ACDF on 05/01/11 [facility])   Deaf    right ear   Diabetes mellitus    DJD (degenerative joint disease)    lumbar spine   Gait abnormality 11/12/2019   Hearing impaired    hearing aids-transmitter rt    History of blood transfusion    as a baby in 1947   History of colonic polyps    History of kidney stones    Hyperlipidemia    Hypertension    pt. denies high blood pressure   Memory change 12/17/2017   Nose fracture    3x   OCD (obsessive compulsive disorder)    Stroke (HCC) 12/06/2019   "mini stroke, that's what gave me the tremors"   Stuttering    Tremor, essential    Vertigo    random   Wears dentures    full upper   Wears glasses      Active Problem List   Patient Active Problem List   Diagnosis Date Noted   Protein-calorie malnutrition, severe 02/19/2023   Severe sepsis (HCC) 02/12/2023   Pressure injury of skin 02/12/2023   Hyperkalemia 02/12/2023   Nonverbal 02/12/2023   Altered mental status 02/12/2023   Weakness generalized 02/12/2023   Insomnia 01/09/2022   Decreased dorsalis pedis pulse 01/09/2022   Dupuytren contracture 02/21/2021   Lower extremity edema 02/21/2021   Left shoulder pain 11/11/2020   Statin myopathy 09/10/2020   Fall at home  08/09/2020   History of CVA in adulthood 12/07/2019   Gait abnormality 11/12/2019   Tremor, essential 11/12/2019   Health care maintenance 10/04/2018   Thumb pain 10/04/2018   Memory change 12/17/2017   Paresthesia 12/03/2017   Lightheaded 08/28/2017   Encounter for chronic pain management 06/18/2017   Neck pain 05/14/2017   Panic 01/10/2017   Muscle ache 10/05/2015   Syncope 07/08/2015   Chronic back pain 04/12/2015   Medicare annual wellness visit, initial 03/08/2015   Advance care planning 03/08/2015   Dysgeusia 11/28/2014   Anxiety state 10/27/2014   Diabetic polyneuropathy (HCC) 02/14/2014   Rhinitis, chronic 01/13/2012   Basal cell carcinoma    LUMBAR RADICULOPATHY 10/16/2010   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/07/2010   HLD (hyperlipidemia) 05/13/2008   Uncontrolled type 2 diabetes mellitus with hyperglycemia (HCC) 05/26/2007   Essential hypertension 05/26/2007   SCOLIOSIS 05/26/2007     Past Surgical History   Past Surgical History:  Procedure Laterality Date   APPENDECTOMY  1960   BACK SURGERY  2012   lumbar surgery   CATARACT EXTRACTION W/PHACO Right 04/12/2019   Procedure: CATARACT EXTRACTION PHACO AND INTRAOCULAR LENS PLACEMENT (IOC) RIGHT, DIABETIC;  Surgeon: Nevada Crane, MD;  Location:  [facility];  Service: Ophthalmology;  Laterality: Right;  Diabetic - insulin and oral meds   CATARACT EXTRACTION W/PHACO Left 05/10/2019   Procedure: CATARACT EXTRACTION PHACO AND INTRAOCULAR LENS PLACEMENT (IOC)  LEFT DIABETIC  00:25.6  14.4%  3.75;  Surgeon: Nevada Crane, MD;  Location: [facility];  Service: Ophthalmology;  Laterality: Left;  Diabetic - insulin and oral meds   CERVICAL FUSION  1990   CERVICAL FUSION  8/12   multiple levels with plates   CHOLESTEATOMA EXCISION  1993   COLLATERAL LIGAMENT REPAIR, ELBOW     bilateral, nerve improvement left 08/04, right 09/04   COLONOSCOPY     CYST EXCISION     back of cervical  spine total of 5 surgeries   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   Procedure: DUPUYTREN CONTRACTURE RELEASE;  Surgeon: Christena Flake, MD;  Location: [facility];  Service: Orthopedics;  Laterality: Left;   EXTERNAL EAR SURGERY     multiple ear surguries as a child   OTHER SURGICAL HISTORY     benign head tumor#5   OTHER SURGICAL HISTORY     sebaceous cysts-post neck x5   SHOULDER ARTHROSCOPY W/ ACROMIAL REPAIR  08/2004   left shoulder   TONSILLECTOMY     ULNAR NERVE TRANSPOSITION Right 05/13/2013   Procedure: RIGHT ULNAR NERVE DECOMPRESSION/TRANSPOSITION;  Surgeon: Wyn Forster., MD;  Location: [facility];  Service: Orthopedics;  Laterality: Right;     Home Medications   Prior to Admission medications   Medication Sig Start Date End Date Taking? Authorizing Provider  ALPRAZolam Prudy Feeler) 0.5 MG tablet Take 1 tablet (0.5 mg total) by mouth daily as needed for anxiety. 02/24/23   Arnetha Courser, MD  aspirin 81 MG EC tablet Take 1 tablet (81 mg total) by mouth daily. For stroke prevention Patient taking differently: Take 81 mg by mouth daily with lunch. For stroke prevention 05/17/20   Joaquim Nam, MD  BD PEN NEEDLE NANO U/F 32G X 4 MM MISC USE AS DIRECTED 07/26/20   Joaquim Nam, MD  cyanocobalamin 1000 MCG tablet Take 1 tablet (1,000 mcg total) by mouth daily. 02/24/23   Arnetha Courser, MD  feeding supplement (ENSURE ENLIVE / ENSURE PLUS) LIQD Take 237 mLs by mouth 2 (two) times daily between meals. 02/24/23   Arnetha Courser, MD  FLUoxetine (PROZAC) 10 MG capsule Take 3 capsules (30 mg total) by mouth daily with lunch. TAKE 3 CAPSULES BY MOUTH DAILY FOR MOOD AS DIRECTED 04/16/22   Poggi, Excell Seltzer, MD  folic acid (FOLVITE) 1 MG tablet Take 1 tablet (1 mg total) by mouth daily. 02/24/23   Arnetha Courser, MD  glipiZIDE (GLUCOTROL XL) 10 MG 24 hr tablet Take 1 tablet (10 mg total) by mouth daily with breakfast. For diabetes 05/17/20   Joaquim Nam, MD  insulin glargine  (LANTUS SOLOSTAR) 100 UNIT/ML Solostar Pen Inject 20 Units into the skin at bedtime. For diabetes 04/16/22   Poggi, Excell Seltzer, MD  irbesartan (AVAPRO) 150 MG tablet Take 150 mg by mouth daily. 01/23/23   [provider]  metFORMIN (GLUCOPHAGE-XR) 500 MG 24 hr tablet TAKE ONE TABLET 3 TIMES DAILY for diabetes Patient taking differently: Take 500 mg by mouth 3 (three) times daily with meals. TAKE ONE TABLET 3 TIMES DAILY for diabetes 05/17/20   Joaquim Nam, MD  Multiple Vitamin (MULTIVITAMIN WITH MINERALS) TABS tablet Take 1 tablet by mouth daily. 02/24/23   Arnetha Courser, MD  oxyCODONE-acetaminophen (PERCOCET/ROXICET) 5-325 MG tablet TAKE 1-2 TABLETS BY MOUTH 3 TIMES DAILY AS NEEDED FOR PAIN. Fill on/after 60 days from rx 12/12/22   Joaquim Nam, MD  oxyCODONE-acetaminophen (PERCOCET/ROXICET) 5-325 MG tablet TAKE ONE TO TWO TABLETS BY MOUTH 3 TIMES DAILY IF NEEDED FOR PAIN. 12/12/22   Joaquim Nam, MD  oxyCODONE-acetaminophen (PERCOCET/ROXICET) 5-325 MG tablet TAKE ONE TO TWO TABLETS BY MOUTH 3 TIMESDAILY AS NEEDED FOR PAIN.  Fill on/after 30 days from rx 02/24/23   Arnetha Courser, MD  primidone (MYSOLINE) 50 MG tablet TAKE 1 TABLET BY MOUTH DAILY 01/01/23   Joaquim Nam, MD  propranolol ER (INDERAL LA) 60 MG 24 hr capsule TAKE 1 CAPSULE BY MOUTH ONCE DAILY 12/23/22   Joaquim Nam, MD  tiZANidine (ZANAFLEX) 4 MG tablet TAKE ONE TABLET 3 TIMES DAILY AS NEEDED FOR MUSCLE SPASM MAY TAKE 1 EXTRADOSE DAILY AS NEEDED 01/01/22   Joaquim Nam, MD  traZODone (DESYREL) 50 MG tablet TAKE 1 AND 1/2 TABLETS BY MOUTH AT BEDTIME AS NEEDED FOR SLEEP 09/25/22   Joaquim Nam, MD  Vitamin D, Ergocalciferol, (DRISDOL) 1.25 MG (50000 UNIT) CAPS capsule Take 1 capsule (50,000 Units total) by mouth every 7 (seven) days. 02/27/23   Arnetha Courser, MD     Allergies  Bee venom, Pravastatin, Ace inhibitors, Actos [pioglitazone], Angiotensin receptor blockers, Aripiprazole, Crestor [rosuvastatin calcium],  Invokana [canagliflozin], Metformin and related, Nsaids, Olanzapine, Pneumovax 23 [pneumococcal vac polyvalent], Codeine, Divalproex sodium, Lithium carbonate, and Sulfamethoxazole-trimethoprim   Family History   Family History  Problem Relation Age of Onset   OCD Mother    Bipolar disorder Mother    Cancer Mother    Emphysema Mother        never smoker, worked @ [employer] scientific [employer]   Glaucoma Father    Arthritis Sister    OCD Sister    Colon cancer Neg Hx    Prostate cancer Neg Hx      Physical Exam  Triage Vital Signs: ED Triage Vitals  Encounter Vitals Group     BP 08/15/23 0124 (!) 164/100     Systolic BP Percentile --      Diastolic BP Percentile --      Pulse Rate 08/15/23 0124 (!) 125     Resp 08/15/23 0124 (!) 21     Temp 08/15/23 0124 (!) 102.7 F (39.3 C)     Temp Source 08/15/23 0124 Rectal     SpO2 08/15/23 0118 95 %     Weight --      Height --      Head Circumference --      Peak Flow --      Pain Score --      Pain Loc --      Pain Education --      Exclude from Growth Chart --     Updated Vital Signs: BP (!) 149/83   Pulse (!) 103   Temp (!) 102.7 F (39.3 C) (Rectal)   Resp (!) 21   SpO2 98%    General: Awake, mild to moderate distress.  CV:  Tachycardic.  Good peripheral perfusion.  Resp:  Normal effort.  CTAB. Abd:  Nontender.  No distention.  Other:  Eyes open spontaneously.  Moaning.  Follows simple commands.   ED Results / Procedures / Treatments  Labs (all labs ordered are listed, but only abnormal results are displayed) Labs Reviewed  CBC WITH DIFFERENTIAL/PLATELET - Abnormal; Notable for the following components:  Result Value   RBC 4.21 (*)    Hemoglobin 12.7 (*)    HCT 37.1 (*)    Lymphs Abs 0.3 (*)    All other components within normal limits  COMPREHENSIVE METABOLIC PANEL - Abnormal; Notable for the following components:   Sodium 134 (*)    CO2 20 (*)    Glucose, Bld 230 (*)    Creatinine, Ser 1.32 (*)    GFR,  Estimated 56 (*)    All other components within normal limits  URINALYSIS, W/ REFLEX TO CULTURE (INFECTION SUSPECTED) - Abnormal; Notable for the following components:   Color, Urine YELLOW (*)    APPearance TURBID (*)    Glucose, UA >=500 (*)    Hgb urine dipstick SMALL (*)    Ketones, ur 5 (*)    Protein, ur 100 (*)    Leukocytes,Ua LARGE (*)    All other components within normal limits  TROPONIN I (HIGH SENSITIVITY) - Abnormal; Notable for the following components:   Troponin I (High Sensitivity) 35 (*)    All other components within normal limits  URINE CULTURE  CULTURE, BLOOD (ROUTINE X 2)  CULTURE, BLOOD (ROUTINE X 2)  LACTIC ACID, PLASMA  TROPONIN I (HIGH SENSITIVITY)     EKG  ED ECG REPORT I, Donelda Mailhot J, the attending physician, personally viewed and interpreted this ECG.   Date: 08/15/2023  EKG Time: 0127  Rate: 127  Rhythm: sinus tachycardia  Axis: Normal  Intervals:none  ST&T Change: Nonspecific    RADIOLOGY I have independently visualized and interpreted patient's imaging studies as well as noted the radiology interpretation:  X-ray: Right sided infiltrate  CT head discussed with radiology: Intraparenchymal hemorrhage  Official radiology report(s): CT Head Wo Contrast  Result Date: 08/15/2023 CLINICAL DATA:  Altered mental status EXAM: CT HEAD WITHOUT CONTRAST TECHNIQUE: Contiguous axial images were obtained from the base of the skull through the vertex without intravenous contrast. RADIATION DOSE REDUCTION: This exam was performed according to the departmental dose-optimization program which includes automated exposure control, adjustment of the mA and/or kV according to patient size and/or use of iterative reconstruction technique. COMPARISON:  None Available. FINDINGS: Brain: There is acute intraparenchymal hemorrhage within the right occipital lobe, measuring 1.8 x 1.3 cm. Mild surrounding edema. No midline shift or other mass effect. There is  generalized volume loss. Old left MCA territory infarct. Vascular: There is atherosclerotic calcification of both internal carotid arteries at the skull base. Skull: Remote right mastoidectomy Sinuses/Orbits: No acute finding. Other: None. IMPRESSION: 1. Acute intraparenchymal hemorrhage within the right occipital lobe, measuring 1.8 x 1.3 cm. Mild surrounding edema. No midline shift or other mass effect. 2. Old left MCA territory infarct. Critical Value/emergent results were called by telephone at the time of interpretation on 08/15/2023 at 2:51 am to provider Laura Caldas , who verbally acknowledged these results. Electronically Signed   By: Deatra Robinson M.D.   On: 08/15/2023 02:51   DG Chest Port 1 View  Result Date: 08/15/2023 CLINICAL DATA:  Altered mental status. EXAM: PORTABLE CHEST 1 VIEW COMPARISON:  February 12, 2023 FINDINGS: Limited study secondary to patient rotation. The heart size and mediastinal contours are within normal limits. Low lung volumes are noted with very mild atelectasis and/or infiltrate seen along the periphery of the right lung base. No pleural effusion or pneumothorax is identified. Radiopaque fusion plates and screws are seen overlying the cervical spine with multilevel degenerative changes noted throughout the thoracic spine. IMPRESSION: Low lung volumes with very  mild lateral right basilar atelectasis and/or infiltrate. Electronically Signed   By: Aram Candela M.D.   On: 08/15/2023 01:45     PROCEDURES:  Critical Care performed: Yes, see critical care procedure note(s)  CRITICAL CARE Performed by: Irean Hong   Total critical care time: 60 minutes  Critical care time was exclusive of separately billable procedures and treating other patients.  Critical care was necessary to treat or prevent imminent or life-threatening deterioration.  Critical care was time spent personally by me on the following activities: development of treatment plan with patient and/or  surrogate as well as nursing, discussions with consultants, evaluation of patient's response to treatment, examination of patient, obtaining history from patient or surrogate, ordering and performing treatments and interventions, ordering and review of laboratory studies, ordering and review of radiographic studies, pulse oximetry and re-evaluation of patient's condition.   Marland Kitchen1-3 Lead EKG Interpretation  Performed by: Irean Hong, MD Authorized by: Irean Hong, MD     Interpretation: abnormal     ECG rate:  125   ECG rate assessment: tachycardic     Rhythm: sinus tachycardia     Ectopy: none     Conduction: normal   Comments:     Patient placed on cardiac monitor to evaluate for arrhythmias    MEDICATIONS ORDERED IN ED: Medications  cefTRIAXone (ROCEPHIN) 2 g in sodium chloride 0.9 % 100 mL IVPB (0 g Intravenous Stopped 08/15/23 0220)  azithromycin (ZITHROMAX) 500 mg in sodium chloride 0.9 % 250 mL IVPB (500 mg Intravenous New Bag/Given 08/15/23 0343)  levETIRAcetam (KEPPRA) IVPB 1000 mg/100 mL premix (1,000 mg Intravenous New Bag/Given 08/15/23 0349)  sodium chloride 0.9 % bolus 1,000 mL (0 mLs Intravenous Stopped 08/15/23 0232)    And  sodium chloride 0.9 % bolus 1,000 mL (0 mLs Intravenous Stopped 08/15/23 0334)    And  sodium chloride 0.9 % bolus 500 mL (500 mLs Intravenous New Bag/Given 08/15/23 0335)  acetaminophen (TYLENOL) suppository 975 mg (975 mg Rectal Given 08/15/23 0144)     IMPRESSION / MDM / ASSESSMENT AND PLAN / ED COURSE  I reviewed the triage vital signs and the nursing notes.                             77 year old male brought for fever, altered mental status, recent diagnosis of UTI. Differential diagnosis includes, but is not limited to, alcohol, illicit or prescription medications, or other toxic ingestion; intracranial pathology such as stroke or intracerebral hemorrhage; fever or infectious causes including sepsis; hypoxemia and/or hypercarbia; uremia; trauma;  endocrine related disorders such as diabetes, hypoglycemia, and thyroid-related diseases; hypertensive encephalopathy; etc. I personally reviewed patient's records and note a last PCP office visit at the [place] from 06/04/2023.  I have also reviewed a discharge summary from 02/24/2023 where it is documented that patient's baseline is nonverbal status due to history of prior CVA.  Patient's presentation is most consistent with acute presentation with potential threat to life or bodily function.  The patient is on the cardiac monitor to evaluate for evidence of arrhythmia and/or significant heart rate changes.  Patient meets sepsis criteria on arrival.  ED code sepsis initiated with 30 cc/kilo IV fluids, rectal Tylenol and 2 g IV Rocephin for likely UTI.  Anticipate hospitalization.  Clinical Course as of 08/15/23 0353  Fri Aug 15, 2023  1610 Discussed CT head with radiologist:1. Acute intraparenchymal hemorrhage within the right occipital lobe, measuring 1.8 x  1.3 cm. Mild surrounding edema. No midline shift or other mass effect. 2. Old left MCA territory infarct.  I have tried to contact patient's spouse at [phone] which is not a working phone number and have left a message at [phone]   [JS]  8295 Spoke with Dr. Myer Haff from neurosurgery who recommends neurology consultation given intraparenchymal hemorrhage.  Blood pressure currently 149/83, will continue to monitor.  Will consult tele-neurology [JS]  [phone] Appreciate teleneurology recommendations which include Keppra.  Will discuss with CCU intensivist  for evaluation and admission. [JS]    Clinical Course User Index [JS] Irean Hong, MD     FINAL CLINICAL IMPRESSION(S) / ED DIAGNOSES   Final diagnoses:  Sepsis, due to unspecified organism, unspecified whether acute organ dysfunction present (HCC)  Fever, unspecified fever cause  Altered mental status, unspecified altered mental status type  Intraparenchymal hemorrhage of brain  (HCC)  Lower urinary tract infectious disease  Community acquired pneumonia, unspecified laterality     Rx / DC Orders   ED Discharge Orders     None        Note:  This document was prepared using Dragon voice recognition software and may include unintentional dictation errors.   Irean Hong, MD 08/15/23 [phone]

## 2023-08-15 NOTE — Consult Note (Signed)
[facility] Consult Services  Stat Consult  Patient Name:   Walter Harris, Walter Harris Date of Birth:   07-03-1946 Identification Number:   MRN - [id number] Date of Service:   08/15/2023 03:12:21  Diagnosis:       I61.1 - Intracerebral hemorrhage in hemisphere, cortical  Impression 6M presented with decreased LOC, fever, Hemorrhage on Head CT. ICH doesn't appear aneurysmal so no emergent vessel imaging necessary. Not a thrombolytic candidate 2/2 ICH. Rec toxic metabolic infectious workup (U/A, COVID, chest xray, and UDS etc as per ED MD/primary team discretion), rec admission for ICH workup. SBP < 140/90, NSG consult, load of Keppra 1000 mg IV and then 500 mg bid. Repeat HCT in 24 hrs unless clinically worsens (repeat stat).   Recommendations: Our recommendations are outlined below.  Laboratory Studies : INR/PT, aPTT, CBC  Medications : Hold antiplatelet therapy/NSAIDS/Anticoagulation Load with Keppra 1gm now Keppra 500mg  bid.  Nursing recommendations : Telemetry, IV Fluids. Avoid dextrose containing fluids, Maintain euglycemia. Head of bed 30 degrees Neuro checks q1-2 hrs during ICU stay Once stable neuro checks q4 hrs BP control, lower SBP to less than 160 with goal of 140's  Consultations : Need Neurosurgery consultation STAT Recommend Speech therapy if failed dysphagia screen Physical therapy/Occupational therapy  DVT Prophylaxis : SCDs  Disposition : Neurology will follow Additional Recommendations: MRI brain wo  ICH Score: 1 GlasGow Coma Score: 5-12 (+1) Age >= 80: No (0) ICH volume >= 30mL: No (0) Intraventricular hemorrhage: No (0) Infratentorial origin of hemorrhage: No (0)   ----------------------------------------------------------------------------------------------------    Metrics: Dispatch Time: 08/15/2023 03:09:06 Callback Response Time: 08/15/2023 03:12:53  Primary Provider Notified of Diagnostic Impression and  Management Plan on: 08/15/2023 03:40:57   CT HEAD: Reviewed 1. Acute intraparenchymal hemorrhage within the right occipital lobe, measuring 1.8 x 1.3 cm. Mild surrounding edema. No midline shift or other mass effect. 2. Old left MCA territory infarct.    ----------------------------------------------------------------------------------------------------  Chief Complaint: decreased LOC, fever, hemorrhage on Head CT  History of Present Illness: Patient is a 77 year old Male. 5M presents from a SNF presented with decreased LOC, fever, Hemorrhage on Head CT. Has a fever of 103 and decreased LOC due to UTI and pneumonia. Pts baseline mental status is nonverbal. Symptoms started last night, early this morning. Physician is concerned about the decreased LOC, would like input.  No reported falls.  Hx: CVA, Dementia  Imaging: Head CT(shows small hemorrhage)  Would like eval and recs. Unclear neurological baseline. Non-verbal but unclear if he can ambulate.   Past Medical History:      Hypertension      Hyperlipidemia      Stroke Other PMH:  deaf in right ear and hearing impaired otherwise, nonverbal, essential tremor, OCD,  Medications:  No Anticoagulant use  Antiplatelet use: Yes ASA Reviewed EMR for current medications  Allergies:  Reviewed  Social History: Smoking: No Alcohol Use: No Drug Use: No  Family History:  There is no family history of premature cerebrovascular disease pertinent to this consultation  ROS : 14 Points Review of Systems was performed and was negative except mentioned in HPI.  Past Surgical History: There Is No Surgical History Contributory To Today's Visit   Examination: BP(152/84), Pulse(103), Blood Glucose(230) 1A: Level of Consciousness - Alert; keenly responsive + 0 1B: Ask Month and Age - Could Not Answer Either Question Correctly + 2 1C: Blink Eyes & Squeeze Hands - Performs 0 Tasks + 2 2: Test Horizontal Extraocular Movements  - Normal +  0 3: Test Visual Fields - Complete Hemianopia + 2 4: Test Facial Palsy (Use Grimace if Obtunded) - Normal symmetry + 0 5A: Test Left Arm Motor Drift - No Drift for 10 Seconds + 0 5B: Test Right Arm Motor Drift - No Drift for 10 Seconds + 0 6A: Test Left Leg Motor Drift - No Drift for 5 Seconds + 0 6B: Test Right Leg Motor Drift - No Drift for 5 Seconds + 0 7: Test Limb Ataxia (FNF/Heel-Shin) - Does Not Understand + 0 8: Test Sensation - Normal; No sensory loss + 0 9: Test Language/Aphasia - Severe Aphasia: Fragmentary Expression, Inference Needed, Cannot Identify Materials + 2 10: Test Dysarthria - Severe Dysarthria: Unintelligble Slurring or Out of Proportion to Aphasia + 2 11: Test Extinction/Inattention - No abnormality + 0  NIHSS Score: 10 NIHSS Free Text : no BTT on the left  Spoke with : Dr. Dolores Frame    This consult was conducted in real time using interactive audio and Immunologist. Patient was informed of the technology being used for this visit and agreed to proceed. Patient located in hospital and provider located at home/office setting.  Patient is being evaluated for possible acute neurologic impairment and high probability of imminent or life - threatening deterioration.I spent total of 35 minutes providing care to this patient, including time for face to face visit via telemedicine, review of medical records, imaging studies and discussion of findings with providers, the patient and / or family.   Dr Amedeo Kinsman   [facility] For Inpatient follow-up with [facility] physician please call [facility] at [phone]. As we are not an outpatient service for any post hospital discharge needs please contact the hospital for assistance.  If you have any questions for the [facility] physicians or need to reconsult for clinical or diagnostic changes please contact us via [facility] at [phone].

## 2023-08-15 NOTE — IPAL (Signed)
  Interdisciplinary Goals of Care Family Meeting   Date carried out: 08/15/2023  Location of the meeting: Bedside  Member's involved: Physician, Bedside Registered Nurse, Family Member or next of kin, and Palliative care team member    GOALS OF CARE DISCUSSION  The Clinical status was relayed to family in detail- Step Daughter at Bedside  Updated and notified of patients medical condition- Patient remains unresponsive and will not open eyes to command.   Explained to family course of therapy and the modalities   Patient with Progressive multiorgan failure with a very high probablity of a very minimal chance of meaningful recovery despite all aggressive and optimal medical therapy.   Family understands the situation.  They have consented and agreed to DNR/DNI  Family are satisfied with Plan of action and management. All questions answered  Additional CC time 25 mins   Cher Franzoni Santiago Glad, M.D.  [facility]  Medical Director [facility] Medical Director [facility] Cardio-Pulmonary Department

## 2023-08-15 NOTE — Consult Note (Signed)
PHARMACY CONSULT NOTE - ELECTROLYTES  Pharmacy Consult for Electrolyte Monitoring and Replacement   Recent Labs:   CrCl cannot be calculated (Unknown ideal weight.). Potassium (mmol/L)  Date Value  08/15/2023 3.7  09/13/2013 4.0   Magnesium (mg/dL)  Date Value  53/29/9242 1.6 (L)   Calcium (mg/dL)  Date Value  68/34/1962 8.9   Calcium, Total (mg/dL)  Date Value  22/97/9892 9.1   Albumin (g/dL)  Date Value  11/94/1740 3.5  09/13/2013 3.6   Phosphorus (mg/dL)  Date Value  81/44/8185 3.2   Sodium (mmol/L)  Date Value  08/15/2023 134 (L)  09/13/2013 138    Assessment  Walter Harris is a 77 y.o. male presenting with altered mental status. PMH significant for dementia, HLD, HTN, T2DM, and stroke. Pharmacy has been consulted to monitor and replace electrolytes.  Diet: NPO MIVF: N/A Pertinent medications: N/A  Goal of Therapy: Electrolytes WNL  Plan:  Mg 1.6: Mag sulfate 2g IV x 1 ordered Check BMP, Mg, Phos with AM labs  Thank you for allowing pharmacy to be a part of this patient's care.  Bettey Costa, PharmD Clinical Pharmacist 08/15/2023 7:42 AM

## 2023-08-15 NOTE — ED Notes (Signed)
Patient returned from MRI.  When attempting to retake patient temperature, it was noticed that he had a bowel movement.  His brief and linens have been changed, and patient has been repositioned in bed.  Patient is resting comfortably at this time.

## 2023-08-15 NOTE — Sepsis Progress Note (Signed)
 Elink monitoring for the code sepsis protocol. Notified bedside nurse of need to draw and administer antibiotics, lactic acid, and blood cultures.

## 2023-08-15 NOTE — Progress Notes (Signed)
SLP Cancellation Note  Patient Details Name: Walter Harris MRN: [id number] DOB: Feb 01, 1946   Cancelled treatment:       Reason Eval/Treat Not Completed: SLP screened, no needs identified, will sign off for cognitive linguistic services. ST to follow for possible diet upgrade.   Lidwina Kaner B. Dreama Saa, M.S., CCC-SLP, CBIS Speech-Language Pathologist Certified Brain Injury Specialist [facility]  [facility] [phone] Ascom [phone] Fax [phone]    Reuel Derby 08/15/2023, 3:56 PM

## 2023-08-15 NOTE — ED Triage Notes (Signed)
EMS called for pt change in mental status. Pt comes from [facility]. Hx dementia. Pt recently diagnosed with UTI. EMS reports fever.

## 2023-08-15 NOTE — Consult Note (Signed)
Consultation Note Date: 08/15/2023   Patient Name: Walter Harris  DOB: 26-Jan-1946  MRN: [id number]  Age / Sex: 77 y.o., male  PCP: Joaquim Nam, MD Referring Physician: [name] Fulling, MD  Reason for Consultation: Establishing goals of care   HPI/Brief Hospital Course: 77 y.o. male  with past medical history of dementia, hypertension, hyperlipidemia, CVA essential tremors and type 2 diabetes admitted from [facility] on 08/15/2023 with altered mental status.  Recent diagnosis of UTI reported from facility staff.  ED imaging revealed acute intraparenchymal hemorrhage within right occipital lobe no shift or mass effect  Palliative medicine was consulted for assisting with goals of care conversations  Subjective:  Extensive chart review has been completed prior to meeting patient including labs, vital signs, imaging, progress notes, orders, and available advanced directive documents from current and previous encounters.  Visited with Mr. Bowder at his bedside.  He is in bed with eyes closed, opens eyes to calling of his name, occasionally able to answer simple yes or no questions, drifts back off to sleep without redirection.  Step daughter [name] and her husband at bedside during time of visit.  Introduced myself as a Publishing rights manager as a member of the palliative care team. Explained palliative medicine is specialized medical care for people living with serious illness. It focuses on providing relief from the symptoms and stress of a serious illness. The goal is to improve quality of life for both the patient and the family.   Joined by Dr. Belia Heman ICU attending as well as Dr. Selina Cooley with neurology.  Dr. Selina Cooley reviewed image findings as well as expected deficits being partial loss of vision.  [name] shares Mr. Mccreadie recently moved to [place] about 3 months ago and around the same time unfortunately his wife of many years passed away.  At baseline  [name] shares Mr. Jayne able to feed himself, mobility limited, passed the time by watching television.  [name] confirms Mr. Woollard does not have biological children, she shares she has power of attorney documentation but unsure if that includes HCPOA/medical decision making. She also shares Mr. Lyter's next of kin would be his sister [name] who lives in [place] but is planning to visit at bedside tomorrow.  We discussed patient's current illness and what it means in the larger context of patient's on-going co-morbidities. Natural disease trajectory and expectations at EOL were discussed.   CODE STATUS was addressed and we discussed the difference between full code versus DO NOT RESUSCITATE.  [name] shares DNR has previously been established and she thought was already in place.   The difference between aggressive medical intervention and comfort care was discussed.  [name] shares quality of life would be important to Mr. Sieh and as he is at this time she does not feel this would be an acceptable quality of life for him.  Requested [name] provide power of attorney documentation. Copy emailed from [facility], from review it does not seem [name] has HCPOA.  Called and spoke with sister-Willie, she shares she had an understanding that [name] was established POA.  Explained the difference between durable and healthcare POA.  [name] confirms she plans to visit at bedside tomorrow and is open to having goals of care conversations with [name] present.  We discussed CODE STATUS and will he is also in agreement with DNR for Mr. Neto.  All questions/concerns addressed. PMT will continue to follow and support patient as needed.  Objective: Primary Diagnoses: Present on Admission:  ICH (intracerebral hemorrhage) (  HCC)  Uncontrolled type 2 diabetes mellitus with hyperglycemia (HCC)   Vital Signs: BP (!) 151/96   Pulse 96   Temp 98.4 F (36.9 C) (Axillary)   Resp 17   Ht 5\' 8"  (1.727 m)   SpO2 98%   BMI 26.05 kg/m   Pain Scale: 0-10   Pain Score: 0-No pain   IO: Intake/output summary:  Intake/Output Summary (Last 24 hours) at 08/15/2023 1723 Last data filed at 08/15/2023 1600 Gross per 24 hour  Intake 1130 ml  Output --  Net 1130 ml    LBM: Last BM Date :  (UTA) Baseline Weight:   Most recent weight:         Assessment and Plan  SUMMARY OF RECOMMENDATIONS   DNR/DNI Ongoing GOC to be had tomorrow with sister present  Palliative Prophylaxis:   Bowel Regimen, Delirium Protocol and Frequent Pain Assessment  Discussed With: ICU team and nursing staff   Thank you for this consult and allowing Palliative Medicine to participate in the care of Cosimo L. Willets. Palliative medicine will continue to follow and assist as needed.   Time Total: 75 minutes  Time spent includes: Detailed review of medical records (labs, imaging, vital signs), medically appropriate exam (mental status, respiratory, cardiac, skin), discussed with treatment team, counseling and educating patient, family and staff, documenting clinical information, medication management and coordination of care.   Signed by: Leeanne Deed, DNP, AGNP-C Palliative Medicine    Please contact Palliative Medicine Team phone at [phone] for questions and concerns.  For individual provider: See Loretha Stapler

## 2023-08-15 NOTE — Progress Notes (Deleted)
CHIEF COMPLAINT:   Abd distention  Severe DT's +abd pain    REVIEW OF SYSTEMS  PATIENT IS UNABLE TO PROVIDE COMPLETE REVIEW OF SYSTEMS DUE TO SEVERE CRITICAL ILLNESS   PHYSICAL EXAMINATION: GENERAL:critically ill appearing GASTROINTESTINAL: +distended. RUQ pain on palpation    VITALS:  temperature is 97.9 F (36.6 C). His blood pressure is 142/81 (abnormal) and his pulse is 97. His respiration is 30 (abnormal) and oxygen saturation is 100%.   I personally reviewed Labs under Results section.  Radiology Reports CT ANGIO HEAD NECK W WO CM  Result Date: 08/15/2023 CLINICAL DATA:  Provided history: Stroke, hemorrhagic. Evaluate for vertebrobasilar stenosis. EXAM: CT ANGIOGRAPHY HEAD AND NECK WITH AND WITHOUT CONTRAST TECHNIQUE: Multidetector CT imaging of the head and neck was performed using the standard protocol during bolus administration of intravenous contrast. Multiplanar CT image reconstructions and MIPs were obtained to evaluate the vascular anatomy. Carotid stenosis measurements (when applicable) are obtained utilizing NASCET criteria, using the distal internal carotid diameter as the denominator. RADIATION DOSE REDUCTION: This exam was performed according to the departmental dose-optimization program which includes automated exposure control, adjustment of the mA and/or kV according to patient size and/or use of iterative reconstruction technique. CONTRAST:  75mL OMNIPAQUE IOHEXOL 350 MG/ML SOLN COMPARISON:  Brain MRI 08/15/2023. FINDINGS: CT HEAD FINDINGS Brain: Generalized cerebral and cerebellar atrophy. Prominence of the ventricles and sulci appears commensurate. Acute parenchymal hemorrhage within the right occipital lobe with surrounding edema, grossly unchanged from the brain MRI and non-contrast head CT examinations performed earlier today. Chronic cortical/subcortical left MCA territory infarcts within the left frontal lobe/insula and left parietal lobe. Chronic lacunar  infarcts again within/bout the bilateral basal ganglia. Background cerebral white matter and pontine chronic small vessel disease, overall better delineated on the brain MRI performed earlier today. Small chronic infarcts within the left cerebellar hemisphere. No midline shift. Vascular: No hyperdense vessel.  Atherosclerotic calcifications. Skull: No acute calvarial fracture or aggressive osseous lesion. Prior right mastoidectomy. Sinuses/Orbits: No orbital mass or acute orbital finding. No significant paranasal sinus disease. Review of the MIP images confirms the above findings CTA NECK FINDINGS Aortic arch: Standard aortic branching. Atherosclerotic plaque within the visualized thoracic aorta and proximal major branch vessels of the neck. No innominate or proximal right subclavian artery stenosis. 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 Right carotid system: CCA and ICA patent within the neck without measurable stenosis. Mild atherosclerotic plaque scattered within the CCA and about the carotid bifurcation. Left carotid system: CCA and ICA patent within the neck. Atherosclerotic plaque scattered throughout the CCA, about the carotid bifurcation and within the proximal ICA. Most notably, atherosclerotic plaque within the proximal ICA results in less than 50% stenosis Vertebral arteries: The vertebral arteries are patent within the neck. Moderate/severe atherosclerotic stenosis at the right vertebral artery origin. Apparent moderate stenosis within the left vertebral artery V1 segment (however, this could potentially be artifactual and related to beam hardening artifact at this level). Moderate stenosis of the left vertebral artery V2 segment at the C4-C5 level. Skeleton: Prior ACDF at the C4-C7 levels. Hardware remains at C4-C5 and C6-C7. Superimposed cervical spondylosis. No acute fracture or aggressive osseous lesion. Other neck: No neck mass or  cervical lymphadenopathy. Upper chest: No consolidation within the imaged lung apices. Review of the MIP images confirms the above findings CTA HEAD FINDINGS Anterior circulation: The intracranial internal carotid arteries are patent. Atherosclerotic plaque within both vessels with no more than mild  stenosis. The M1 middle cerebral arteries are patent. Mild stenosis within the right middle cerebral artery proximal M1 segment. Atherosclerotic irregularity of the M2 and more distal MCA vessels, bilaterally. No M2 proximal branch occlusion or high-grade proximal stenosis. The anterior cerebral arteries are patent. No intracranial aneurysm is identified. Posterior circulation: The intracranial vertebral arteries are patent. 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 The posterior cerebral arteries are patent. Atherosclerotic irregularity of both vessels. Most notably, there is a severe stenosis within the left PCA P2 segment. Posterior communicating arteries are present bilaterally. Venous sinuses: Evaluation for dural venous sinus thrombosis is limited by contrast timing. Anatomic variants: As described. Review of the MIP images confirms the above findings. CTA head impression #2 will be called to the ordering clinician or representative by the Radiologist Assistant, and communication documented in the PACS or [employer]. IMPRESSION: Non-contrast head CT: 1. Acute parenchymal hemorrhage within the right occipital lobe with surrounding edema, unchanged from the brain MRI and head CT examinations performed earlier today. 2. Background parenchymal atrophy, chronic small vessel ischemic disease and chronic infarcts as described. CTA neck: 1. The common carotid and internal carotid arteries are patent within the neck without hemodynamically significant stenosis (50% or greater). Atherosclerotic plaque bilaterally, as described. 2. The vertebral arteries are patent within the neck.  Moderate/severe atherosclerotic narrowing at the right vertebral artery origin. Apparent moderate stenosis within the left vertebral artery V1 segment (however, this could potentially be artifactual and related to beam hardening artifact at this level). Moderate stenosis within the left vertebral artery V2 segment. 3. 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 4. Aortic Atherosclerosis (ICD10-I70.0). CTA head: 1. Intracranial atherosclerotic disease with multifocal stenoses, most notably as follows. 2. 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 3. Severe stenosis within the left posterior cerebral artery P2 segment. Electronically Signed   By: Jackey Loge D.O.   On: 08/15/2023 09:03   MR BRAIN WO CONTRAST  Addendum Date: 08/15/2023   ADDENDUM REPORT: 08/15/2023 05:52 ADDENDUM: Study discussed by telephone with Dr. Dolores Frame in the ED on 08/15/2023 at 0547 hours. Electronically Signed   By: Odessa Fleming M.D.   On: 08/15/2023 05:52   Result Date: 08/15/2023 CLINICAL DATA:  77 year old male with altered mental status. Right occipital lobe hemorrhage on head CT this morning. EXAM: MRI HEAD WITHOUT CONTRAST TECHNIQUE: Multiplanar, multiecho pulse sequences of the brain and surrounding structures were obtained without intravenous contrast. COMPARISON:  Head CT 0219 hours today.  Prior brain MRI 12/06/2019. FINDINGS: Brain: Intra-axial hemorrhage within the medial right occipital lobe is T2 and T1 signal heterogeneous, mostly T1 isointense (sagittal image 11). The blood products in compass roughly 19 x 23 by 35 mm on MRI (AP by transverse by CC) for an estimated volume of 7-8 mL. Surrounding T2 and FLAIR hyperintense vasogenic edema. No intraventricular extension. No convincing extra-axial extension. DWI susceptibility in the area of hemorrhage with no larger area of abnormal diffusion. No other restricted diffusion. No midline shift or significant  intracranial mass effect. Cervicomedullary junction and pituitary are within normal limits. Ventricular enlargement is chronic but mildly increased since 2021 and favored to be ex vacuo in nature. On SWI chronic microhemorrhages in the bilateral cerebellum, along with increased small chronic cerebellar infarcts since 2021. Posterior right temporal lobe, and both frontal lobes are new, for about a half dozen areas of chronic cerebral blood products overall. Superimposed chronic left MCA territory infarction and encephalomalacia  which is evolved from acute findings on the 2021 MRI. Additional mild scattered cerebral but moderate pontine chronic T2 and FLAIR heterogeneity. Vascular: Partial loss of vertebrobasilar flow voids is new since 2021 and suggests poor flow in the posterior circulation such as on series 15, image 80. Anterior circulation flow voids appear stable. Skull and upper cervical spine: Negative. Visualized bone marrow signal is within normal limits. Sinuses/Orbits: Stable, negative. Other: Chronic right mastoidectomy. IMPRESSION: 1. Evidence of poor flow in the vertebrobasilar arteries, new from a 2021 MRI. CTA would best evaluate further. And progressed small chronic cerebellar infarcts and microhemorrhages since that time. 2. Right occipital lobe intra-axial hemorrhage redemonstrated, estimated 7-8 mL by MRI. Regional vasogenic edema with no significant mass effect, no IVH or extra-axial extension. 3. Progressed chronic Left MCA territory ischemia and small number of bilateral cerebral microhemorrhages also since 2021. The SWI appearance does not rise to the level of amyloid angiopathy at this time. Electronically Signed: By: Odessa Fleming M.D. On: 08/15/2023 05:44   DG Abd 1 View  Result Date: 08/15/2023 CLINICAL DATA:  MRI clearance. EXAM: ABDOMEN - 1 VIEW COMPARISON:  08/15/2023 FINDINGS: One-view abdomen shows nonspecific bowel gas pattern. No unexpected radiopaque soft tissue foreign body.  Telemetry leads overlie the chest. IMPRESSION: No unexpected radiopaque soft tissue foreign body. Electronically Signed   By: Kennith Center M.D.   On: 08/15/2023 05:14   CT Head Wo Contrast  Result Date: 08/15/2023 CLINICAL DATA:  Altered mental status EXAM: CT HEAD WITHOUT CONTRAST TECHNIQUE: Contiguous axial images were obtained from the base of the skull through the vertex without intravenous contrast. RADIATION DOSE REDUCTION: This exam was performed according to the departmental dose-optimization program which includes automated exposure control, adjustment of the mA and/or kV according to patient size and/or use of iterative reconstruction technique. COMPARISON:  None Available. FINDINGS: Brain: There is acute intraparenchymal hemorrhage within the right occipital lobe, measuring 1.8 x 1.3 cm. Mild surrounding edema. No midline shift or other mass effect. There is generalized volume loss. Old left MCA territory infarct. Vascular: There is atherosclerotic calcification of both internal carotid arteries at the skull base. Skull: Remote right mastoidectomy Sinuses/Orbits: No acute finding. Other: None. IMPRESSION: 1. Acute intraparenchymal hemorrhage within the right occipital lobe, measuring 1.8 x 1.3 cm. Mild surrounding edema. No midline shift or other mass effect. 2. Old left MCA territory infarct. Critical Value/emergent results were called by telephone at the time of interpretation on 08/15/2023 at 2:51 am to provider JADE SUNG , who verbally acknowledged these results. Electronically Signed   By: Deatra Robinson M.D.   On: 08/15/2023 02:51   DG Chest Port 1 View  Result Date: 08/15/2023 CLINICAL DATA:  Altered mental status. EXAM: PORTABLE CHEST 1 VIEW COMPARISON:  February 12, 2023 FINDINGS: Limited study secondary to patient rotation. The heart size and mediastinal contours are within normal limits. Low lung volumes are noted with very mild atelectasis and/or infiltrate seen along the periphery of the  right lung base. No pleural effusion or pneumothorax is identified. Radiopaque fusion plates and screws are seen overlying the cervical spine with multilevel degenerative changes noted throughout the thoracic spine. IMPRESSION: Low lung volumes with very mild lateral right basilar atelectasis and/or infiltrate. Electronically Signed   By: Aram Candela M.D.   On: 08/15/2023 01:45       Assessment/Plan:  SBO/ILEUS Check LIPASE and CT ABD PELVIS ORDERED US AB PENDING  Additional  Critical Care Time devoted to patient care services described in  this note is 25 mins minutes.  Critical care was necessary to treat /prevent imminent and life-threatening deterioration.   Lucie Leather, M.D.  [facility]  Medical Director [facility] Medical Director [facility] Cardio-Pulmonary Department

## 2023-08-15 NOTE — Progress Notes (Signed)
ICH s/t trauma from recent falls vs chronic infarct Baseline mentation is mostly non verbal unless spoken to, but can answer questions and often knows what he wants to say. Daughter reports that his mentation has been slightly worse over the past 3-4 weeks. And he has had several falls, 2 weeks ago he fell and hit his head enough to cause bruising.   She does not report any other recent complaints/symptoms. Daughter updated on plan of care, all questions and concerns answered at this time.    Cheryll Cockayne Rust-Chester, AGACNP-BC Acute Care Nurse Practitioner [facility]   [phone] / [phone] Please see Amion for details.

## 2023-08-15 NOTE — H&P (Signed)
NAME:  Walter Harris, MRN:  [id number], DOB:  [birth year], LOS: 0 ADMISSION DATE:  08/15/2023, CONSULTATION DATE:  08/15/23 REFERRING MD:  Dr. Dolores Frame, CHIEF COMPLAINT:  AMS   History of Present Illness:  77 yo M presenting from [facility] via EMS on 08/15/23 for evaluation of AMS.  History provided per chart review as patient is unable to participate in interview or provide any history. Wife and daughter called, voicemail left. History very limited from EMS via [employer]. SNF staff reported recent diagnosis of UTI and that patient developed altered mental status. Very unclear what his baseline mentation is. Prior H&P from June 2024 documented his baseline as nonverbal, however EMS reported being told the patient is able to converse and this mentation is a change. On bedside assessment patient able to state his full name upon persistent questioning. He is able to answer "yes" and "no", but unclear if appropriate. He is also able to follow some simple intermittent commands. EMS also reported a fever, no other history available at this time.  ED course: Upon arrival patient met SIRS/sepsis criteria with tachycardia/ tachypnea/fever and protocol initiated. Labs revealed mild hyponatremia, mild NAGMA & AKI, hyperglycemia, hypomagnesemia & mildly elevated troponin. UA consistent with UTI. 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 EDP called Neurosurgery on call who stated the patient is not a candidate for surgical intervention. Tele-neurology consultation with recommendation for ICU admission, further recommendations discussed in A&P. Medications given: Tyelnol, azithromycin & Rocephin, keppra, 2.5 L IVF bolus Initial Vitals: 102.7, 21, 113, 146/88 & 98% on RA Significant labs: (Labs/ Imaging personally reviewed) I, Cheryll Cockayne Rust-Chester, AGACNP-BC, personally viewed and  interpreted this ECG. EKG Interpretation: Date: 08/15/23, EKG Time: 01:27, Rate: 127, Rhythm: ST, QRS Axis: normal, Intervals: normal, ST/T Wave abnormalities: none, Narrative Interpretation: ST Chemistry: Na+:134, K+: 3.7, BUN/Cr.: 18/ 1.32, Serum CO2/ AG: 20/10, Glucose: 230 Hematology: WBC: 8.5, Hgb: 12.7,  Troponin: 35, Lactic/ PCT: 1.6/ pending, COVID-19 & Influenza A/B: pending  CXR 08/15/23: Low lung volumes with very mild lateral right basilar atelectasis and/or infiltrate. CT head wo contrast 08/15/23: Acute intraparenchymal hemorrhage within the right occipital lobe, measuring 1.8 x 1.3 cm. Mild surrounding edema. No midline shift or other mass effect. Old left MCA territory infarct. MRI brain w/wo contrast 08/15/23: Evidence of poor flow in the vertebrobasilar arteries, new from a 2021 MRI. CTA would best evaluate further. And progressed small chronic cerebellar infarcts and microhemorrhages since that time. Right occipital lobe intra-axial hemorrhage redemonstrated, estimated 7-8 mL by MRI. Regional vasogenic edema with no significant mass effect, no IVH or extra-axial extension. Progressed chronic Left MCA territory ischemia and small number of bilateral cerebral microhemorrhages also since 2021. The SWI appearance does not rise to the level of amyloid angiopathy at this time. CT angio w/wo 08/15/23: pending  PCCM consulted for admission due to ICH suspect secondary to chronic infarct, AMS and UTI.  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 Neurology & Neurosurgical consultation  Interim History / Subjective:  Patient alert and responsive, A&O x 1 with intermittent commands.  Wife, [name] &  Daughter, [name],  called without answer. Voicemail left  Objective   Blood pressure 119/69, pulse (!) 104, temperature (!) 102.7 F (39.3 C), temperature source Rectal, resp. rate 20, SpO2 98%.        Intake/Output Summary (Last 24 hours) at 08/15/2023 0503 Last data filed at 08/15/2023 0232 Gross per 24 hour  Intake 1100 ml  Output --  Net 1100 ml   There were no vitals filed for this visit.  Examination: General: Adult male, critically ill, lying in bed, NAD HEENT: MM pink/moist, anicteric, atraumatic, neck supple Neuro: A&O x 1, able to follow simple/intermittent commands, PERRL +3, MAE Unable to assess CN efficiently due to inability to follow complex instructions. Tongue protrusion midline. Motor Strength - strength 4/5 all extremities and pronator drift was absent.  Coordination - The patient had normal movements in the hands and feet with no ataxia or dysmetria.  Baseline Tremor present Gait and Station - deferred.  NIHSS Level of  Consciousness: 0 = Alert, keenly responsive; 1 = Not alert, but arousable by minor stimulation to obey, answer, or respond; 2 = Not alert, requires repeated stimulation to attend, or is obtunded and requires strong or painful stimulation to make movements (not stereotyped); 3 = Responds only with reflex motor or autonomic effects or totally unresponsive, flaccid, and areflexic   0  LOC Questions 0 = Answers both questions correctly; 1 = Answers one question correctly; 2 = Answers neither question correctly.  1  LOC Commands 0 = Performs both tasks correctly; 1 = Performs one task correctly; 2 = Performs neither task correctly  1  Best Gaze  0 = Normal; 1 = Partial gaze palsy; gaze is abnormal in one or both eyes, but forced deviation or total gaze paresis is not present; 2 = Forced deviation, or total gaze paresis not overcome by the oculocephalic maneuver   1  Visual 0 = No visual loss; 1 = Partial hemianopia; 2 = Complete hemianopia; 3 = Bilateral  hemianopia (blind including cortical blindness).   2  Facial Palsy 0 = Normal symmetrical movements; 1 = Minor paralysis (flattened nasolabial fold, asymmetry on smiling); 2 = Partial paralysis (total or near-total paralysis of lower face); 3 = Complete paralysis of one or both sides (absence of facial movement in the upper and lower face).  0  Motor Arm LEFT 0 = No drift; limb holds 90 (or 45) degrees for full 10 secs; 1 = Drift; limb holds 90 (or 45) degrees, but drifts down before full 10 seconds; does not hit bed or other support; 2 = Some effort against gravity; limb cannot get to or maintain (if cued) 90 (or 45) degrees, drifts down to bed, but has some effort against gravity; 3 = No effort against gravity; limb falls; 4 = No movement; UN = unable to test (Amputation or joint fusion).   0  Motor Arm RIGHT 0 = No drift; limb holds 90 (or 45) degrees for full 10 secs; 1 = Drift; limb holds 90 (or 45) degrees, but drifts down before full 10 seconds; does not hit bed or other support; 2 = Some effort against gravity; limb cannot get to or maintain (if cued) 90 (or 45) degrees, drifts down to bed, but has some effort against gravity; 3 = No effort against gravity; limb falls; 4 = No movement; UN = unable to test (Amputation or joint fusion).   0  Motor Leg LEFT 0 = No drift; leg holds 30 degree position for full 5 secs; 1 =  Drift; leg falls by the end of the 5-sec period but does not hit bed; 2 = Some effort against gravity; leg falls to bed by 5 secs, but has some effort against gravity; 3 = No effort against gravity; leg falls to bed immediately; 4 = No movement; UN = unable to test (Amputation or joint fusion).   0  Motor Leg RIGHT 0 = No drift; leg holds 30 degree position for full 5 secs; 1 = Drift; leg falls by the end of the 5-sec period but does not hit bed; 2 = Some effort against gravity; leg falls to bed by 5 secs, but has some effort against gravity; 3 = No effort against gravity; leg falls to bed  immediately; 4 = No movement; UN = unable to test (Amputation or joint fusion).   0  Limb Ataxia 0 = Absent; 1 = Present in one limb; 2 = Present in two limbs; UN = Amputation or joint fusion   0  Sensory  0 = Normal, no sensory loss; 1 = Mild-to-moderate sensory loss, patient feels pinprick is less sharp or is dull on the affected side, or there is a loss of superficial pain with pinprick, but patient is aware of being touched; 2 = Severe to total sensory loss, patient is not aware of being touched in the face, arm, and leg.   0  Best Language 0 = No aphasia; normal; 1 = Mild-to-moderate aphasia; some obvious loss of fluency or facility of comprehension, w/o significant limitation on ideas expressed or form of expression. Reduction of speech and/or comprehension, however, makes conversation about provided materials difficult or impossible. For example, in conversation about provided materials, examiner can identify picture or naming card content from patient's response; 2 = Severe aphasia; all communication is through fragmentary expression; great need for inference, questioning, and guessing by the listener. Range of information that can be exchanged is limited; listener carries burden of communication. Examiner cannot identify materials provided from patient response; 3 = Mute, global aphasia; no usable speech or auditory comprehension  2  Dysarthria 0 = Normal; 1 = Mild-to-moderate dysarthria, patient slurs at least some words and, at worst, can be understood with some difficulty; 2 = Severe dysarthria, patient's speech is so slurred as to be unintelligible in the absence of or out of proportion to any dysphasia, or is mute/anarthric; UN = Intubated or other physical barrier  1  Extinction/Inattention 0 = No abnormality 1 = Visual, tactile, auditory, spatial, or personal extinction/inattention to bilateral simultaneous stimulation in one of the sensory modalities. 2 = Profound hemi-inattention or  extinction to more than one modality; does not recognize own hand or orients to only one side of space.  0  Total   8   CV: s1s2 RRR, NSR on monitor, no r/m/g Pulm: Regular, non labored on RA, breath sounds clear-BUL & diminished-BLL GI: soft, rounded non tender, bs x 4 Skin: ecchymosis on BUE & bilateral hips, no rashes/lesions noted Extremities: warm/dry, pulses + 2 R/P, no edema noted  Resolved Hospital Problem list     Assessment & Plan:  Intracranial Hemorrhage suspect secondary to chronic infarct Patient loaded with 1g Keppra. MRI showed Evidence of poor flow in the vertebrobasilar arteries, new from a 2021 MRI & progressed chronic infarcts & micro hemorrhages - f/u CT angio (to evaluate suspected vertebrobasilar artery stenosis) at 8 am with repeat CTH so as not to obscure bleed - Goal SBP 130-150 using prn Labetolol or Nicardipine Gtt if needed -  HOB >= 30 degrees with aspiration precautions - supplemental O2 PRN, to Maintain O2 sats > 92%. - Q1 neuro checks -- get stat head CT and call Neurology if acute change - Repeat CT head in 6 hours - f/u INR, goal <1.4, daily coags.  - Pain control - MRI brain wo contrast - Keppra 500 mg BID per neurology recommendations - No HepSQ, antiplatelet or anticoagulant agents at this time - Maintain strict euglycemia, euthermia, and euvolemia - CBG Q 4 - Echocardiogram ordered - Consult neurosurgery - Neurology consulted, appreciate input - IV fluids gentle hydration - f/u lipid panel & HA1C  SIR in the setting of recent UTI diagnosis Query Atelectasis vs CAP ~ low suspicion Initial interventions/workup included: 2.5 L of NS/LR & Ceftriaxone & Azithromycin - Supplemental oxygen as needed, to maintain SpO2 > 90% - f/u cultures, trend lactic/ PCT - Daily CBC, monitor WBC/ fever curve - IV antibiotics: ceftriaxone  - IVF hydration as needed - Consider vasopressors to maintain MAP< 65: norepinephrine - Strict I/O's: alert provider if  UOP < 0.5 mL/kg/hr  Acute Kidney Injury  NAGMA Mild Hyponatremia Baseline Cr: 0.91, Cr on admission:1.32 - Strict I/O's: alert provider if UOP < 0.5 mL/kg/hr - gentle IVF hydration  - Daily BMP, replace electrolytes PRN - Avoid nephrotoxic agents as able, ensure adequate renal perfusion - consider renal US if renal fxn does not improve with IVF resuscitation  Mildly Elevated but flat Troponin secondary to demand ischemia PMHx: HTN, HLD  - Echocardiogram ordered as above - not a candidate for heparin drip due to ICH - continuous cardiac monitoring - Consider Cardiology consultation depending on w/u above - hold outpatient PO regimen: ASA & irbesartan - labetalol IV PRN for SBP > 150  Type 2 Diabetes Mellitus Hemoglobin A1C: pending - Monitor CBG Q 4 hours - SSI moderate dosing, hold outpatient lantus (20 units at bedtime while NPO- reconsider if needed), hold metformin - target range while in ICU: 140-180 - follow ICU hyper/hypo-glycemia protocol  Dementia Suspected Acute Encephalopathy  Unclear what baseline mentation is, awaiting family report - consider palliative consultation - hold sedating medications - supportive care, frequent neuro checks - consider restarting outpatient regimen as patient stabilizes: trazodone, prozac, xanax, oxycodone, zanaflex  Best Practice (right click and "Reselect all SmartList Selections" daily)  Diet/type: NPO DVT prophylaxis: SCDs Start: 08/15/23 0356   Pressure ulcer(s): not present on admission  GI prophylaxis: PPI Lines: N/A Foley:  N/A Code Status:  full code Last date of multidisciplinary goals of care discussion [unable to reach family, patient has a documented DNR in Epic from previous admission but has not paperwork from SNF to confirm and is listed as a FULL code in SNF's paperwork]  Labs   CBC: Recent Labs  Lab 08/15/23 0134  WBC 8.5  NEUTROABS 7.5  HGB 12.7*  HCT 37.1*  MCV 88.1  PLT 221    Basic Metabolic  Panel: Recent Labs  Lab 08/15/23 0134  NA 134*  K 3.7  CL 104  CO2 20*  GLUCOSE 230*  BUN 18  CREATININE 1.32*  CALCIUM 8.9   GFR: CrCl cannot be calculated (Unknown ideal weight.). Recent Labs  Lab 08/15/23 0134  WBC 8.5  LATICACIDVEN 1.6    Liver Function Tests: Recent Labs  Lab 08/15/23 0134  AST 16  ALT 15  ALKPHOS 81  BILITOT 0.7  PROT 6.8  ALBUMIN 3.5   No results for input(s): "LIPASE", "AMYLASE" in the last 168 hours. No results  for input(s): "AMMONIA" in the last 168 hours.  ABG    Component Value Date/Time   TCO2 24 05/13/2013 0905     Coagulation Profile: No results for input(s): "INR", "PROTIME" in the last 168 hours.  Cardiac Enzymes: No results for input(s): "CKTOTAL", "CKMB", "CKMBINDEX", "TROPONINI" in the last 168 hours.  HbA1C: Hemoglobin A1C  Date/Time Value Ref Range Status  12/06/2021 12:00 AM 9.0  Final  03/30/2020 12:00 AM 9.0  Final   Hgb A1c MFr Bld  Date/Time Value Ref Range Status  02/13/2023 05:10 AM 8.2 (H) 4.8 - 5.6 % Final    Comment:    (NOTE)         Prediabetes: 5.7 - 6.4         Diabetes: >6.4         Glycemic control for adults with diabetes: <7.0   11/10/2020 09:34 AM 9.2 (H) 4.6 - 6.5 % Final    Comment:    Glycemic Control Guidelines for People with Diabetes:Non Diabetic:  <6%Goal of Therapy: <7%Additional Action Suggested:  >8%     CBG: No results for input(s): "GLUCAP" in the last 168 hours.  Review of Systems:   UTA- patient nonverbal at baseline (per H&P documentation 02/12/23), only able to answer very specific questions, yes/no and his name  Past Medical History:  He,  has a past medical history of Basal cell carcinoma, Cataracts, both eyes (2019), Complication of anesthesia, Deaf, Diabetes mellitus, DJD (degenerative joint disease), Gait abnormality (11/12/2019), Hearing impaired, History of blood transfusion, History of colonic polyps, History of kidney stones, Hyperlipidemia, Hypertension, Memory  change (12/17/2017), Nose fracture, OCD (obsessive compulsive disorder), Stroke (HCC) (12/06/2019), Stuttering, Tremor, essential, Vertigo, Wears dentures, and Wears glasses.   Surgical History:   Past Surgical History:  Procedure Laterality Date   APPENDECTOMY  1960   BACK SURGERY  2012   lumbar surgery   CATARACT EXTRACTION W/PHACO Right 04/12/2019   Procedure: CATARACT EXTRACTION PHACO AND INTRAOCULAR LENS PLACEMENT (IOC) RIGHT, DIABETIC;  Surgeon: Nevada Crane, MD;  Location: [facility];  Service: Ophthalmology;  Laterality: Right;  Diabetic - insulin and oral meds   CATARACT EXTRACTION W/PHACO Left 05/10/2019   Procedure: CATARACT EXTRACTION PHACO AND INTRAOCULAR LENS PLACEMENT (IOC)  LEFT DIABETIC  00:25.6  14.4%  3.75;  Surgeon: Nevada Crane, MD;  Location: [facility];  Service: Ophthalmology;  Laterality: Left;  Diabetic - insulin and oral meds   CERVICAL FUSION  1990   CERVICAL FUSION  8/12   multiple levels with plates   CHOLESTEATOMA EXCISION  1993   COLLATERAL LIGAMENT REPAIR, ELBOW     bilateral, nerve improvement left 08/04, right 09/04   COLONOSCOPY     CYST EXCISION     back of cervical spine total of 5 surgeries   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   Procedure: DUPUYTREN CONTRACTURE RELEASE;  Surgeon: Christena Flake, MD;  Location: [facility];  Service: Orthopedics;  Laterality: Left;   EXTERNAL EAR SURGERY     multiple ear surguries as a child   OTHER SURGICAL HISTORY     benign head tumor#5   OTHER SURGICAL HISTORY     sebaceous cysts-post neck x5   SHOULDER ARTHROSCOPY W/ ACROMIAL REPAIR  08/2004   left shoulder   TONSILLECTOMY     ULNAR NERVE TRANSPOSITION Right 05/13/2013   Procedure: RIGHT ULNAR NERVE DECOMPRESSION/TRANSPOSITION;  Surgeon: Wyn Forster., MD;  Location: [facility];  Service: Orthopedics;  Laterality: Right;  Social History:   reports that he quit smoking about 7 years ago. His smoking  use included pipe and cigarettes. He started smoking about 47 years ago. He has a 20 pack-year smoking history. He has never used smokeless tobacco. He reports that he does not drink alcohol and does not use drugs.   Family History:  His family history includes Arthritis in his sister; Bipolar disorder in his mother; Cancer in his mother; Emphysema in his mother; Glaucoma in his father; OCD in his mother and sister. There is no history of Colon cancer or Prostate cancer.   Allergies Allergies  Allergen Reactions   Bee Venom Anaphylaxis   Pravastatin Other (See Comments)    Severe myalgias   Ace Inhibitors     Cough   Actos [Pioglitazone] Other (See Comments)    Intolerant, not allergic.    Angiotensin Receptor Blockers Other (See Comments)    cramps   Aripiprazole Other (See Comments)    Unknown reaction many years ago   Crestor [Rosuvastatin Calcium] Other (See Comments)    Aches, even with twice weekly dosing   Invokana [Canagliflozin]     Edema   Metformin And Related Other (See Comments)    Able to tolerate XR formulation, not allergic.    Nsaids     Would avoid given creatinine elevation.   Olanzapine Other (See Comments)    Unknown reaction many years ago   Pneumovax 23 [Pneumococcal Vac Polyvalent] Other (See Comments)    aches   Codeine Rash   Divalproex Sodium Other (See Comments)     tremor   Lithium Carbonate Other (See Comments)    Tremors    Sulfamethoxazole-Trimethoprim Other (See Comments)    Mild reaction per Dr. Alphonsus Sias, pt does not remember the reaction     Home Medications  Prior to Admission medications   Medication Sig Start Date End Date Taking? Authorizing Provider  ALPRAZolam Prudy Feeler) 0.5 MG tablet Take 1 tablet (0.5 mg total) by mouth daily as needed for anxiety. 02/24/23   Arnetha Courser, MD  aspirin 81 MG EC tablet Take 1 tablet (81 mg total) by mouth daily. For stroke prevention Patient taking differently: Take 81 mg by mouth daily with lunch. For  stroke prevention 05/17/20   Joaquim Nam, MD  BD PEN NEEDLE NANO U/F 32G X 4 MM MISC USE AS DIRECTED 07/26/20   Joaquim Nam, MD  cyanocobalamin 1000 MCG tablet Take 1 tablet (1,000 mcg total) by mouth daily. 02/24/23   Arnetha Courser, MD  feeding supplement (ENSURE ENLIVE / ENSURE PLUS) LIQD Take 237 mLs by mouth 2 (two) times daily between meals. 02/24/23   Arnetha Courser, MD  FLUoxetine (PROZAC) 10 MG capsule Take 3 capsules (30 mg total) by mouth daily with lunch. TAKE 3 CAPSULES BY MOUTH DAILY FOR MOOD AS DIRECTED 04/16/22   Poggi, Excell Seltzer, MD  folic acid (FOLVITE) 1 MG tablet Take 1 tablet (1 mg total) by mouth daily. 02/24/23   Arnetha Courser, MD  glipiZIDE (GLUCOTROL XL) 10 MG 24 hr tablet Take 1 tablet (10 mg total) by mouth daily with breakfast. For diabetes 05/17/20   Joaquim Nam, MD  insulin glargine (LANTUS SOLOSTAR) 100 UNIT/ML Solostar Pen Inject 20 Units into the skin at bedtime. For diabetes 04/16/22   Poggi, Excell Seltzer, MD  irbesartan (AVAPRO) 150 MG tablet Take 150 mg by mouth daily. 01/23/23   [provider]  metFORMIN (GLUCOPHAGE-XR) 500 MG 24 hr tablet TAKE ONE TABLET 3  TIMES DAILY for diabetes Patient taking differently: Take 500 mg by mouth 3 (three) times daily with meals. TAKE ONE TABLET 3 TIMES DAILY for diabetes 05/17/20   Joaquim Nam, MD  Multiple Vitamin (MULTIVITAMIN WITH MINERALS) TABS tablet Take 1 tablet by mouth daily. 02/24/23   Arnetha Courser, MD  oxyCODONE-acetaminophen (PERCOCET/ROXICET) 5-325 MG tablet TAKE 1-2 TABLETS BY MOUTH 3 TIMES DAILY AS NEEDED FOR PAIN. Fill on/after 60 days from rx 12/12/22   Joaquim Nam, MD  oxyCODONE-acetaminophen (PERCOCET/ROXICET) 5-325 MG tablet TAKE ONE TO TWO TABLETS BY MOUTH 3 TIMES DAILY IF NEEDED FOR PAIN. 12/12/22   Joaquim Nam, MD  oxyCODONE-acetaminophen (PERCOCET/ROXICET) 5-325 MG tablet TAKE ONE TO TWO TABLETS BY MOUTH 3 TIMESDAILY AS NEEDED FOR PAIN.  Fill on/after 30 days from rx 02/24/23   Arnetha Courser, MD   primidone (MYSOLINE) 50 MG tablet TAKE 1 TABLET BY MOUTH DAILY 01/01/23   Joaquim Nam, MD  propranolol ER (INDERAL LA) 60 MG 24 hr capsule TAKE 1 CAPSULE BY MOUTH ONCE DAILY 12/23/22   Joaquim Nam, MD  tiZANidine (ZANAFLEX) 4 MG tablet TAKE ONE TABLET 3 TIMES DAILY AS NEEDED FOR MUSCLE SPASM MAY TAKE 1 EXTRADOSE DAILY AS NEEDED 01/01/22   Joaquim Nam, MD  traZODone (DESYREL) 50 MG tablet TAKE 1 AND 1/2 TABLETS BY MOUTH AT BEDTIME AS NEEDED FOR SLEEP 09/25/22   Joaquim Nam, MD  Vitamin D, Ergocalciferol, (DRISDOL) 1.25 MG (50000 UNIT) CAPS capsule Take 1 capsule (50,000 Units total) by mouth every 7 (seven) days. 02/27/23   Arnetha Courser, MD     Critical care time: 65 minutes       Betsey Holiday, AGACNP-BC Acute Care Nurse Practitioner [facility]   [phone] / [phone] Please see Amion for details.

## 2023-08-15 NOTE — Plan of Care (Signed)
  Problem: Education: Goal: Knowledge of disease or condition will improve Outcome: Not Progressing Goal: Knowledge of secondary prevention will improve (MUST DOCUMENT ALL) Outcome: Not Progressing Goal: Knowledge of patient specific risk factors will improve Loraine Leriche N/A or DELETE if not current risk factor) Outcome: Not Progressing   Problem: Intracerebral Hemorrhage Tissue Perfusion: Goal: Complications of Intracerebral Hemorrhage will be minimized Outcome: Not Progressing   Problem: Coping: Goal: Will verbalize positive feelings about self Outcome: Not Progressing Goal: Will identify appropriate support needs Outcome: Not Progressing   Problem: Self-Care: Goal: Ability to participate in self-care as condition permits will improve Outcome: Not Progressing Goal: Verbalization of feelings and concerns over difficulty with self-care will improve Outcome: Not Progressing Goal: Ability to communicate needs accurately will improve Outcome: Not Progressing   Problem: Metabolic: Goal: Ability to maintain appropriate glucose levels will improve Outcome: Not Progressing   Problem: Health Behavior/Discharge Planning: Goal: Ability to identify and utilize available resources and services will improve Outcome: Not Progressing Goal: Ability to manage health-related needs will improve Outcome: Not Progressing   Problem: Fluid Volume: Goal: Ability to maintain a balanced intake and output will improve Outcome: Not Progressing   Problem: Health Behavior/Discharge Planning: Goal: Ability to manage health-related needs will improve Outcome: Not Progressing

## 2023-08-15 NOTE — Progress Notes (Addendum)
Patient drowsy in bed, NSR/SB, stable pressures. Difficult to assess NIH accurately. Patient does not answer yes/no appropriately to questions. Patient can follow minimal commands but patient is very confused and is unable to understand the commands. Patient can answer name, follows minimal simple commands with lots of instructions. Pupils 4=brisk.  Hold BP PRNS per MD.   NIH Q4 per MD.

## 2023-08-15 NOTE — Progress Notes (Signed)
Same day note no charge  Patient presented with R occipital hemorrhage and was seen overnight be teleneurology. CTA showed no vascular malformation. MRI brain showed stable hemorrhage with no other acute process. CNS imaging personally reviewed. Patient is more lethargic than on teleneuro exam but unchanged from EDP exam without focal deficits. He is not interactive enough to answer orientation questions or assess visual fields. GOC discussed with Dr. Belia Heman and palliative with stepdaughter and husband. Family is leaning towards comfort care but stepdaughter wishes to consult with patient's sister. In the interim no change to teleneuro plan except no indication for CT at 24 hrs and BP goal 130-150.  Bing Neighbors, MD [facility] [phone]  If 7pm- 7am, please page neurology on call as listed in AMION.

## 2023-08-15 NOTE — ED Notes (Signed)
Called [facility]  per Dr. Dolores Frame spoke with Walter Harris patient on chart 1

## 2023-08-15 NOTE — Evaluation (Signed)
Clinical/Bedside Swallow Evaluation Patient Details  Name: Walter Harris MRN: [id number] Date of Birth: 1946/06/13  Today's Date: 08/15/2023 Time: SLP Start Time (ACUTE ONLY): 0935 SLP Stop Time (ACUTE ONLY): 0955 SLP Time Calculation (min) (ACUTE ONLY): 20 min  Past Medical History:  Past Medical History:  Diagnosis Date   Basal cell carcinoma    multiple   Cataracts, both eyes 2019   surgery pending April 2020   Complication of anesthesia    post-operative cognitive dysfunction after ACDF on 05/01/11 [facility])   Deaf    right ear   Diabetes mellitus    DJD (degenerative joint disease)    lumbar spine   Gait abnormality 11/12/2019   Hearing impaired    hearing aids-transmitter rt    History of blood transfusion    as a baby in 1947   History of colonic polyps    History of kidney stones    Hyperlipidemia    Hypertension    pt. denies high blood pressure   Memory change 12/17/2017   Nose fracture    3x   OCD (obsessive compulsive disorder)    Stroke (HCC) 12/06/2019   "mini stroke, that's what gave me the tremors"   Stuttering    Tremor, essential    Vertigo    random   Wears dentures    full upper   Wears glasses    Past Surgical History:  Past Surgical History:  Procedure Laterality Date   APPENDECTOMY  1960   BACK SURGERY  2012   lumbar surgery   CATARACT EXTRACTION W/PHACO Right 04/12/2019   Procedure: CATARACT EXTRACTION PHACO AND INTRAOCULAR LENS PLACEMENT (IOC) RIGHT, DIABETIC;  Surgeon: Nevada Crane, MD;  Location: [facility];  Service: Ophthalmology;  Laterality: Right;  Diabetic - insulin and oral meds   CATARACT EXTRACTION W/PHACO Left 05/10/2019   Procedure: CATARACT EXTRACTION PHACO AND INTRAOCULAR LENS PLACEMENT (IOC)  LEFT DIABETIC  00:25.6  14.4%  3.75;  Surgeon: Nevada Crane, MD;  Location: [facility];  Service: Ophthalmology;  Laterality: Left;  Diabetic - insulin and oral meds   CERVICAL FUSION  1990   CERVICAL FUSION   8/12   multiple levels with plates   CHOLESTEATOMA EXCISION  1993   COLLATERAL LIGAMENT REPAIR, ELBOW     bilateral, nerve improvement left 08/04, right 09/04   COLONOSCOPY     CYST EXCISION     back of cervical spine total of 5 surgeries   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   Procedure: DUPUYTREN CONTRACTURE RELEASE;  Surgeon: Christena Flake, MD;  Location: [facility];  Service: Orthopedics;  Laterality: Left;   EXTERNAL EAR SURGERY     multiple ear surguries as a child   OTHER SURGICAL HISTORY     benign head tumor#5   OTHER SURGICAL HISTORY     sebaceous cysts-post neck x5   SHOULDER ARTHROSCOPY W/ ACROMIAL REPAIR  08/2004   left shoulder   TONSILLECTOMY     ULNAR NERVE TRANSPOSITION Right 05/13/2013   Procedure: RIGHT ULNAR NERVE DECOMPRESSION/TRANSPOSITION;  Surgeon: Wyn Forster., MD;  Location: [facility];  Service: Orthopedics;  Laterality: Right;   HPI:  Walter Harris is a 77 y.o. male brought to the ED via EMS from [facility] with a chief complaint of altered mental status.  History of dementia.  Recently diagnosed with UTI. MRi revealed 1. Evidence of poor flow in the vertebrobasilar arteries, new from a 2021 MRI. CTA would best  evaluate further. And progressed small chronic cerebellar infarcts and microhemorrhages since that time. 2. Right occipital lobe intra-axial hemorrhage redemonstrated, estimated 7-8 mL by MRI. Regional vasogenic edema with no significant mass effect, no IVH or extra-axial extension. 3. Progressed chronic Left MCA territory ischemia and small number of bilateral cerebral microhemorrhages also since 2021. The SWI appearance does not rise to the level of amyloid angiopathy at this time.    Assessment / Plan / Recommendation  Clinical Impression  Pt presents with what is likely a cognition based dysphagia d/t decreased arousal. While pt was able to arouse with moderate verbal cues, he was confused, fidgety throughout this  evaluation. When consuming ice chips, pt was free of any overt s/s of aspiration. However, when consuming sips of thin liquids via straw, pt began responded with extended coughing and refused further trials of PO. At this time, would recommend pt have ice chips after oral care. ST to follow for possible diet progression. SLP Visit Diagnosis: Dysphagia, unspecified (R13.10)    Aspiration Risk  Mild aspiration risk;Moderate aspiration risk    Diet Recommendation Ice chips PRN after oral care    Medication Administration: Via alternative means Supervision: Full supervision/cueing for compensatory strategies;Staff to assist with self feeding Compensations: Minimize environmental distractions;Slow rate;Small sips/bites Postural Changes: Seated upright at 90 degrees;Remain upright for at least 30 minutes after po intake    Other  Recommendations Oral Care Recommendations: Oral care prior to ice chip/H20    Recommendations for follow up therapy are one component of a multi-disciplinary discharge planning process, led by the attending physician.  Recommendations may be updated based on patient status, additional functional criteria and insurance authorization.  Follow up Recommendations Follow physician's recommendations for discharge plan and follow up therapies      Assistance Recommended at Discharge  TBD  Functional Status Assessment Patient has had a recent decline in their functional status and/or demonstrates limited ability to make significant improvements in function in a reasonable and predictable amount of time  Frequency and Duration min 2x/week  2 weeks       Prognosis Prognosis for improved oropharyngeal function: Fair Barriers to Reach Goals: Cognitive deficits;Severity of deficits      Swallow Study   General Date of Onset: 08/15/23 HPI: Walter Harris is a 77 y.o. male brought to the ED via EMS from [employer] [employer] with a chief complaint of altered mental status.  History  of dementia.  Recently diagnosed with UTI. MRi revealed 1. Evidence of poor flow in the vertebrobasilar arteries, new from a 2021 MRI. CTA would best evaluate further. And progressed small chronic cerebellar infarcts and microhemorrhages since that time. 2. Right occipital lobe intra-axial hemorrhage redemonstrated, estimated 7-8 mL by MRI. Regional vasogenic edema with no significant mass effect, no IVH or extra-axial extension. 3. Progressed chronic Left MCA territory ischemia and small number of bilateral cerebral microhemorrhages also since 2021. The SWI appearance does not rise to the level of amyloid angiopathy at this time. Type of Study: Bedside Swallow Evaluation Previous Swallow Assessment: 02/13/2023 Diet Prior to this Study: NPO Temperature Spikes Noted: Yes Respiratory Status: Room air History of Recent Intubation: No Behavior/Cognition: Alert;Doesn't follow directions;Confused Oral Cavity Assessment: Within Functional Limits Oral Care Completed by SLP: Recent completion by staff Self-Feeding Abilities: Total assist Patient Positioning: Upright in bed Baseline Vocal Quality: Normal Volitional Cough: Cognitively unable to elicit Volitional Swallow: Unable to elicit    Oral/Motor/Sensory Function Overall Oral Motor/Sensory Function: Within functional limits   Ice  Chips Ice chips: Within functional limits Presentation: Spoon   Thin Liquid Thin Liquid: Impaired Presentation: Straw Pharyngeal  Phase Impairments: Cough - Immediate;Cough - Delayed    Nectar Thick Nectar Thick Liquid: Not tested   Honey Thick Honey Thick Liquid: Not tested   Puree Puree: Not tested   Solid     Solid: Not tested     Walter Harris, M.S., CCC-SLP, Tree surgeon Certified Brain Injury Specialist [facility]  [facility] Office [phone] Ascom [phone] Fax [phone]

## 2023-08-15 NOTE — Progress Notes (Signed)
CODE SEPSIS - PHARMACY COMMUNICATION  **Broad Spectrum Antibiotics should be administered within 1 hour of Sepsis diagnosis**  Time Code Sepsis Called/Page Received: 12/6 @ 0133   Antibiotics Ordered: Ceftriaxone 2 gm  Time of 1st antibiotic administration: Ceftriaxone 2 gm IV X 1 on 12/6 @ 0150   Additional action taken by pharmacy:   If necessary, Name of Provider/Nurse Contacted:     Calan Doren D ,PharmD Clinical Pharmacist  08/15/2023  2:50 AM

## 2023-08-15 NOTE — Progress Notes (Signed)
eLink Physician-Brief Progress Note Patient Name: Walter Harris DOB: 11-21-1945 MRN: [id number]   Date of Service  08/15/2023  HPI/Events of Note  77 year old male with a history of CVA in 2021, dementia, metabolic syndrome, and diabetes mellitus who presents from care home after recent diagnosis of urinary tract infection complicated by acute encephalopathy.  In the emergency department he was noted to have a new acute intraparenchymal hemorrhage in the right occipital lobe with mild surrounding edema and was referred to critical care for further management.  He was initially noted to be febrile to 1 and 2.7 which resolved and on examination he is on room air saturating 97% with otherwise normal vitals.  Results consistent with mild electrolyte abnormalities.  Stable troponin and normocytic anemia.  Urinalysis is grossly positive.  Blood cultures and urine cultures been sent.  CT with intraparenchymal hemorrhage as described above-ICH score 0-1.  Chest and abdominal radiographs unremarkable.  eICU Interventions  Maintain tight blood pressure control with labetalol.  Can add on nicardipine or hydralazine as second line.  SBP goal less than 150.  Encourage head of bed elevation  Maintain ceftriaxone  Seizure prophylaxis with Keppra per neurosurgery  DVT prophylaxis with SCDs in the setting of ICH GI prophylaxis with pantoprazole     Intervention Category Evaluation Type: New Patient Evaluation  Meriel Kelliher 08/15/2023, 6:52 AM

## 2023-08-15 NOTE — Progress Notes (Signed)
Initial Nutrition Assessment  DOCUMENTATION CODES:   Severe malnutrition in context of social or environmental circumstances  INTERVENTION:   RD will add supplements with diet advancement   MVI po daily with diet advancement   Pt at high refeed risk; recommend monitor potassium, magnesium and phosphorus labs daily until stable  Daily weights   NUTRITION DIAGNOSIS:   Severe Malnutrition related to social / environmental circumstances as evidenced by severe fat depletion, severe muscle depletion.  GOAL:   Patient will meet greater than or equal to 90% of their needs  MONITOR:   Diet advancement, Labs, Weight trends, Skin, I & O's  REASON FOR ASSESSMENT:   Malnutrition Screening Tool    ASSESSMENT:   77 y/o male with h/o HLD, HTN, DM, anxiety, OCD, stroke, CVA (2021), tremor, DJD, dementia and recently diagnosed UTI who is admitted with AKI, SIRS and ICH.  Visited pt's room today. Pt is a poor historian and is unable to provide any history. Per chart review, pt appears to be down 16lbs(8%) from February to June; pt has not been weighed since June. Pt is pending SLP evaluation. RD will add supplements with diet advancement. Pt is likely at high refeed risk.   Medications reviewed and include: insulin, protonix, senokot, ceftriaxone  Labs reviewed: K 3.6 wnl, P 3.2 wnl, Mg 1.6(L) Cbgs- 125, 168, 210, 200 x 24 hrs AIC 10.8(H)- 12/6  NUTRITION - FOCUSED PHYSICAL EXAM:  Flowsheet Row Most Recent Value  Orbital Region Mild depletion  Upper Arm Region Severe depletion  Thoracic and Lumbar Region Severe depletion  Buccal Region Mild depletion  Temple Region Moderate depletion  Clavicle Bone Region Severe depletion  Clavicle and Acromion Bone Region Severe depletion  Scapular Bone Region Moderate depletion  Dorsal Hand Severe depletion  Patellar Region Severe depletion  Anterior Thigh Region Severe depletion  Posterior Calf Region Severe depletion  Edema (RD  Assessment) None  Hair Reviewed  Eyes Reviewed  Mouth Reviewed  Skin Reviewed  Nails Reviewed   Diet Order:   Diet Order             Diet NPO time specified  Diet effective now                  EDUCATION NEEDS:   No education needs have been identified at this time  Skin:  Skin Assessment: Reviewed RN Assessment (ecchymosis)  Last BM:  PTA  Height:   Ht Readings from Last 1 Encounters:  08/15/23 5\' 8"  (1.727 m)    Weight:   Wt Readings from Last 1 Encounters:  02/19/23 77.7 kg    BMI:  Body mass index is 26.05 kg/m.  Estimated Nutritional Needs:   Kcal:  1900-2200kcal/day  Protein:  95-110g/day  Fluid:  1.8-2.1L/day  Betsey Holiday MS, RD, LDN Please refer to [facility] for RD and/or RD on-call/weekend/after hours pager

## 2023-08-16 ENCOUNTER — Inpatient Hospital Stay
Admit: 2023-08-16 | Discharge: 2023-08-16 | Disposition: A | Discharge status: Home / Self Care | Payer: PPO | Attending: Internal Medicine | Admitting: Internal Medicine

## 2023-08-16 DIAGNOSIS — I611 Nontraumatic intracerebral hemorrhage in hemisphere, cortical: Secondary | ICD-10-CM | POA: Diagnosis not present

## 2023-08-16 DIAGNOSIS — R4182 Altered mental status, unspecified: Secondary | ICD-10-CM | POA: Diagnosis not present

## 2023-08-16 DIAGNOSIS — J189 Pneumonia, unspecified organism: Secondary | ICD-10-CM

## 2023-08-16 DIAGNOSIS — Z515 Encounter for palliative care: Secondary | ICD-10-CM | POA: Diagnosis not present

## 2023-08-16 LAB — GLUCOSE, CAPILLARY
Glucose-Capillary: 115 mg/dL — ABNORMAL HIGH (ref 70–99)
Glucose-Capillary: 117 mg/dL — ABNORMAL HIGH (ref 70–99)
Glucose-Capillary: 118 mg/dL — ABNORMAL HIGH (ref 70–99)
Glucose-Capillary: 144 mg/dL — ABNORMAL HIGH (ref 70–99)
Glucose-Capillary: 184 mg/dL — ABNORMAL HIGH (ref 70–99)
Glucose-Capillary: 224 mg/dL — ABNORMAL HIGH (ref 70–99)

## 2023-08-16 LAB — PHOSPHORUS: Phosphorus: 3 mg/dL (ref 2.5–4.6)

## 2023-08-16 LAB — BASIC METABOLIC PANEL
Anion gap: 6 (ref 5–15)
BUN: 21 mg/dL (ref 8–23)
CO2: 22 mmol/L (ref 22–32)
Calcium: 7.6 mg/dL — ABNORMAL LOW (ref 8.9–10.3)
Chloride: 108 mmol/L (ref 98–111)
Creatinine, Ser: 1.04 mg/dL (ref 0.61–1.24)
GFR, Estimated: >60 mL/min (ref >60)
Glucose, Bld: 123 mg/dL — ABNORMAL HIGH (ref 70–99)
Potassium: 3.6 mmol/L (ref 3.5–5.1)
Sodium: 136 mmol/L (ref 135–145)

## 2023-08-16 LAB — ECHOCARDIOGRAM COMPLETE
Height: 68 in
Weight: 2751.34 [oz_av]

## 2023-08-16 LAB — MAGNESIUM: Magnesium: 2.2 mg/dL (ref 1.7–2.4)

## 2023-08-16 MED ORDER — ORAL CARE MOUTH RINSE: 15.0000 mL | OROMUCOSAL | Status: DC | PRN

## 2023-08-16 NOTE — Progress Notes (Signed)
                                                                                                                                                                                                           Daily Progress Note   Patient Name: Walter Harris       Date: 08/16/2023 DOB: 1946-02-27  Age: 77 y.o. MRN#: [id number] Attending Physician: Lurene Shadow, MD Primary Care Physician: Joaquim Nam, MD Admit Date: 08/15/2023  Reason for Consultation/Follow-up: {Reason for Consult:23484}  HPI/Brief Hospital Review:   Palliative Medicine consulted for ***.  Subjective: Extensive chart review has been completed prior to meeting patient including labs, vital signs, imaging, progress notes, orders, and available advanced directive documents from current and previous encounters.   Patient seen *** ** at bedside   ROS   Objective:  Physical Exam          Vital Signs: BP 121/72   Pulse (!) 56   Temp 97.7 F (36.5 C) (Axillary)   Resp 12   Ht 5\' 8"  (1.727 m)   Wt 78 kg   SpO2 99%   BMI 26.15 kg/m  SpO2: SpO2: 99 % O2 Device: O2 Device: Room Air O2 Flow Rate:     Palliative Care Assessment & Plan   Assessment/Recommendation/Plan  ***     GOC/Code Status: {Palliative Code status:23503}  Prognosis:  {Palliative Care Prognosis:23504}  Discharge Planning: {Palliative dispostion:23505}  Care plan was discussed with ***  Thank you for allowing the Palliative Medicine Team to assist in the care of this patient.  Total time:    Time spent includes: Detailed review of medical records (labs, imaging, vital signs), medically appropriate exam (mental status, respiratory, cardiac, skin), discussed with treatment team, counseling and educating patient, family and staff, documenting clinical information, medication management and coordination of care.  Leeanne Deed, DNP, AGNP-C Palliative Medicine   Please contact Palliative Medicine Team phone at [phone] for  questions and concerns.

## 2023-08-16 NOTE — Progress Notes (Signed)
Neurology progress note  S: Patient oriented to self. Denies VF deficits, is able to detect fingers from right and left visual fields.  O:  Exam  Vitals:   08/16/23 1900 08/16/23 1920  BP: 128/71   Pulse: 80   Resp: 19 17  Temp:  (!) 97.5 F (36.4 C)  SpO2: 98%     Gen: patient lying in bed, NAD CV: extremities appear well-perfused Resp: normal WOB  Neurologic exam MS: alert, oriented x1, follows simple commands Speech: mild dysarthria, no aphasia CN: PERRL, able to detect fingers from right and left VF, EOMI, sensation intact, face symmetric, hearing intact to voice Motor: anti-gravity with drift but not to bed x4 extremities Sensory: SILT Reflexes: 1+ symm with toes down bilat Coordination: UTA 2/2 AMS Gait: deferred  Non-contrast head CT:   1. Acute parenchymal hemorrhage within the right occipital lobe with surrounding edema, unchanged from the brain MRI and head CT examinations performed earlier today. 2. Background parenchymal atrophy, chronic small vessel ischemic disease and chronic infarcts as described.   CTA neck:   1. The common carotid and internal carotid arteries are patent within the neck without hemodynamically significant stenosis (50% or greater). Atherosclerotic plaque bilaterally, as described. 2. The vertebral arteries are patent within the neck. Moderate/severe atherosclerotic narrowing at the right vertebral artery origin. Apparent moderate stenosis within the left vertebral artery V1 segment (however, this could potentially be artifactual and related to beam hardening artifact at this level). Moderate stenosis within the left vertebral artery V2 segment. 3. 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 4. Aortic Atherosclerosis (ICD10-I70.0).   CTA head:   1. Intracranial atherosclerotic disease with multifocal stenoses, most notably as follows. 2. 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 3. Severe stenosis within the left posterior cerebral artery P2 segment.  MRI brain wo contrast  1. Evidence of poor flow in the vertebrobasilar arteries, new from a 2021 MRI. CTA would best evaluate further. And progressed small chronic cerebellar infarcts and microhemorrhages since that time.   2. Right occipital lobe intra-axial hemorrhage redemonstrated, estimated 7-8 mL by MRI. Regional vasogenic edema with no significant mass effect, no IVH or extra-axial extension.   3. Progressed chronic Left MCA territory ischemia and small number of bilateral cerebral microhemorrhages also since 2021. The SWI appearance does not rise to the level of amyloid angiopathy at this time.  CNS imaging personally reviewed  A/P: This is a 77 yo man with hx dementia, HTN, HL, DM2, prior CVA who presents from [facility] for AMS. Head CT showed R occipital hemorrhage which was stable on repeat imaging. Etiology favored to be hypertensive. No aneurysm or vascular malformation on CTA. No underlying mass lesion on MRI brain though interpretation limited 2/2 ICH. Family reports that his QOL has been poor for several months and step-daughter felt that comfort care might be the best option for him. Palliative is planning to meet with his sister HCPOA for further GOC.  - Goal SBP 130-150 - SCDs for DVT prophylaxis - HOB elevated 30 degrees - No antiplatelets or anticoagulation in the setting of ICH - Continue GOC discussions with palliative  Neurology to sign off, will be available prn for questions going forward.  Bing Neighbors, MD [facility] [phone]  If 7pm- 7am, please page neurology on call as listed in AMION.

## 2023-08-16 NOTE — Evaluation (Signed)
Occupational Therapy Evaluation Patient Details Name: Walter Harris MRN: [id number] DOB: Dec 07, 1945 Today's Date: 08/16/2023   History of Present Illness 77 yo M presenting from [facility] via EMS on 08/15/23 for evaluation of AMS.   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 PMH: CVA with baseline aphasia, dementia, HTN   Clinical Impression   Walter Harris was seen for OT evaluation this date. Pt is poor historian, per chart from [facility]. Pt has baseline aphasia but responds well to yes/no questions, follows 1 step commands with time, and states "I am thirsty." Pt currently requires MOD A bed mobility. MIN A sit<>stand with BUE support for 3 trials, persistent L lateral lean in sitting and standing. Tolerates standing marching however unable to take side steps - unclear if cognition or balnace limiting. SETUP oral care at bed level. Pt would benefit from skilled OT to address noted impairments and functional limitations (see below for any additional details). Upon hospital discharge, recommend OT follow up <3 hours/day.     If plan is discharge home, recommend the following: A little help with walking and/or transfers;Supervision due to cognitive status;A lot of help with bathing/dressing/bathroom    Functional Status Assessment  Patient has had a recent decline in their functional status and demonstrates the ability to make significant improvements in function in a reasonable and predictable amount of time.  Equipment Recommendations  Other (comment) (defer)    Recommendations for Other Services       Precautions / Restrictions Precautions Precautions: Fall Restrictions Weight Bearing Restrictions: No      Mobility Bed Mobility Overal bed mobility: Needs Assistance Bed Mobility: Supine to Sit, Sit to Supine     Supine to sit: Mod assist Sit to supine:  Mod assist        Transfers Overall transfer level: Needs assistance Equipment used: Rolling walker (2 wheels) Transfers: Sit to/from Stand Sit to Stand: Min assist           General transfer comment: difficulty maintaining grasp on RW in L hand      Balance Overall balance assessment: Needs assistance Sitting-balance support: Bilateral upper extremity supported, Feet supported Sitting balance-Walter Harris Scale: Poor Sitting balance - Comments: heavy L lateral lean Postural control: Left lateral lean, Posterior lean Standing balance support: Bilateral upper extremity supported, During functional activity Standing balance-Walter Harris Scale: Poor Standing balance comment: heavy L posterior lean                           ADL either performed or assessed with clinical judgement   ADL Overall ADL's : Needs assistance/impaired                                       General ADL Comments: MAX A don B socks in sitting, L lateral lean noted. SETUP oral care at bed level     Vision   Vision Assessment?: Yes;Vision impaired- to be further tested in functional context Additional Comments: B dysmetria with FNF, touches PIP joint instead of fingertip then corrects            Pertinent Vitals/Pain Pain Assessment Pain Assessment: No/denies pain     Extremity/Trunk Assessment Upper Extremity Assessment Upper Extremity Assessment: Generalized weakness;LUE deficits/detail LUE Deficits / Details: Grip 3/5  Lower Extremity Assessment Lower Extremity Assessment: Generalized weakness       Communication Communication Communication: Difficulty communicating thoughts/reduced clarity of speech;Difficulty following commands/understanding Following commands: Follows one step commands consistently Cueing Techniques: Verbal cues   Cognition Arousal: Alert Behavior During Therapy: WFL for tasks assessed/performed Overall Cognitive Status: No family/caregiver present  to determine baseline cognitive functioning                                 General Comments: baseline aphasia, delayed responses to 1 step commands, responds to yes/no questions. states "I am thirsty"                Home Living Family/patient expects to be discharged to:: Skilled nursing facility                                 Additional Comments: from [facility] per chart was a thome 02/2023      Prior Functioning/Environment Prior Level of Function : Patient poor historian/Family not available             Mobility Comments: per chart pt was SPT to w/c at home prior to 02/2023 - pt unable to state moblity hx at [facility]          OT Problem List: Decreased strength;Decreased range of motion;Decreased activity tolerance;Impaired balance (sitting and/or standing);Decreased safety awareness      OT Treatment/Interventions: Self-care/ADL training;Therapeutic exercise;Energy conservation;DME and/or AE instruction;Therapeutic activities;Balance training;Patient/family education    OT Goals(Current goals can be found in the care plan section) Acute Rehab OT Goals Patient Stated Goal: to have water OT Goal Formulation: With patient Time For Goal Achievement: 08/30/23 Potential to Achieve Goals: Fair ADL Goals Pt Will Perform Grooming: with set-up;sitting;with supervision Pt Will Perform Lower Body Dressing: with contact guard assist;sit to/from stand Pt Will Transfer to Toilet: with supervision;stand pivot transfer;bedside commode  OT Frequency: Min 1X/week    Co-evaluation              AM-PAC OT "6 Clicks" Daily Activity     Outcome Measure Help from another person eating meals?: None Help from another person taking care of personal grooming?: A Little Help from another person toileting, which includes using toliet, bedpan, or urinal?: A Lot Help from another person bathing (including washing, rinsing, drying)?: A Lot Help  from another person to put on and taking off regular upper body clothing?: A Little Help from another person to put on and taking off regular lower body clothing?: A Lot 6 Click Score: 16   End of Session Equipment Utilized During Treatment: Gait belt;Rolling walker (2 wheels) Nurse Communication: Mobility status  Activity Tolerance: Patient tolerated treatment well Patient left: in bed;with call bell/phone within reach  OT Visit Diagnosis: Unsteadiness on feet (R26.81);Hemiplegia and hemiparesis Symptoms and signs involving cognitive functions: Nontraumatic intracerebral hemorrhage Hemiplegia - Right/Left: Left                Time: 5784-6962 OT Time Calculation (min): 18 min Charges:  OT General Charges $OT Visit: 1 Visit OT Evaluation $OT Eval Moderate Complexity: 1 Mod  Kathie Dike, M.S. OTR/L  08/16/23, 10:31 AM  ascom [phone]

## 2023-08-16 NOTE — Progress Notes (Signed)
PHARMACY CONSULT NOTE - FOLLOW UP  Pharmacy Consult for Electrolyte Monitoring and Replacement   Recent Labs: Potassium (mmol/L)  Date Value  08/16/2023 3.6  09/13/2013 4.0   Magnesium (mg/dL)  Date Value  16/06/9603 2.2   Calcium (mg/dL)  Date Value  54/05/8118 7.6 (L)   Calcium, Total (mg/dL)  Date Value  14/78/2956 9.1   Albumin (g/dL)  Date Value  21/30/8657 3.5  09/13/2013 3.6   Phosphorus (mg/dL)  Date Value  84/69/6295 3.0   Sodium (mmol/L)  Date Value  08/16/2023 136  09/13/2013 138     Assessment: 77 y/o male with h/o HLD, HTN, DM, anxiety, OCD, stroke, CVA (2021), tremor, DJD, dementia and recently diagnosed UTI who is admitted with AKI, SIRS and ICH. Pharmacy is asked to follow and replace electrolytes.  Goal of Therapy:  Electrolytes WNL  Plan:  ---no electrolyte replacement warranted for today ---recheck electrolytes in am  Lowella Bandy ,PharmD Clinical Pharmacist 08/16/2023 7:06 AM

## 2023-08-16 NOTE — Progress Notes (Signed)
Progress Note    Walter Harris  NUU:[id number] DOB: 1946/03/08  DOA: 08/15/2023 PCP: Joaquim Nam, MD      Brief Narrative:    Medical records reviewed and are as summarized below:  Walter Harris is a 77 y.o. male with medical history significant for recently diagnosed UTI, dementia, hypertension, hyperlipidemia, type II DM, CAD, history of stroke, essential tremors, hard of hearing, basal cell carcinoma, degenerative joint disease, who was brought from the nursing home to the ED because of acute change in mental status. He was febrile with temperature of 102.7 F, tachypneic, tachycardic and hypertensive in the ED.       Assessment/Plan:   Principal Problem:   ICH (intracerebral hemorrhage) (HCC) Active Problems:   Uncontrolled type 2 diabetes mellitus with hyperglycemia (HCC)   UTI (urinary tract infection)   AKI (acute kidney injury) (HCC)   Elevated troponin   Dementia (HCC)   Fever   Intraparenchymal hemorrhage of brain (HCC)   Nutrition Problem: Severe Malnutrition Etiology: social / environmental circumstances  Signs/Symptoms: severe fat depletion, severe muscle depletion   Body mass index is 26.15 kg/m.    Intracranial hemorrhage neuritis abdominal with regional vasogenic edema: Continue Keppra for seizure prophylaxis.   Sepsis secondary to acute UTI: Urine culture showed Enterococcus faecalis.  Follow-up sensitivity report.  No growth on blood cultures thus far.  Continue IV ceftriaxone.    AKI, non-anion gap metabolic acidosis, hyponatremia: Improved   Hypomagnesemia: Improved   Acute metabolic encephalopathy on underlying dementia: mental status is better and is probably back to baseline   Type II DM: NovoLog as needed for hyperglycemia.   History of mild left MCA stroke (infarct)   Family to meet with palliative care team this evening to continue goals of care discussion.   Diet Order             Diet NPO time specified   Diet effective now                            Consultants: Neurologist  Procedures: None    Medications:     stroke: early stages of recovery book   Does not apply Once   Chlorhexidine Gluconate Cloth  6 each Topical Daily   insulin aspart  0-15 Units Subcutaneous Q4H   pantoprazole (PROTONIX) IV  40 mg Intravenous QHS   senna-docusate  1 tablet Oral BID   Continuous Infusions:  cefTRIAXone (ROCEPHIN)  IV Stopped (08/16/23 0135)   levETIRAcetam Stopped (08/16/23 0244)     Anti-infectives (From admission, onward)    Start     Dose/Rate Route Frequency Ordered Stop   08/15/23 0330  azithromycin (ZITHROMAX) 500 mg in sodium chloride 0.9 % 250 mL IVPB        500 mg 250 mL/hr over 60 Minutes Intravenous  Once 08/15/23 0321 08/15/23 0443   08/15/23 0145  cefTRIAXone (ROCEPHIN) 2 g in sodium chloride 0.9 % 100 mL IVPB        2 g 200 mL/hr over 30 Minutes Intravenous Every 24 hours 08/15/23 0133 08/22/23 0144              Family Communication/Anticipated D/C date and plan/Code Status   DVT prophylaxis: SCDs Start: 08/15/23 0356     Code Status: Limited: Do not attempt resuscitation (DNR) -DNR-LIMITED -Do Not Intubate/DNI   Family Communication: None Disposition Plan: Plan to discharge to SNF   Status is:  Inpatient Remains inpatient appropriate because: Intracranial hemorrhage       Subjective:   Interval events noted.  He is confused and cannot provide any history.  Objective:    Vitals:   08/16/23 0600 08/16/23 0700 08/16/23 0730 08/16/23 0800  BP: 96/60 132/74  122/71  Pulse: 88  83 78  Resp: 19  16 16   Temp:   97.7 F (36.5 C)   TempSrc:   Axillary   SpO2: 98%  100% 100%  Weight:      Height:       No data found.   Intake/Output Summary (Last 24 hours) at 08/16/2023 0910 Last data filed at 08/16/2023 0734 Gross per 24 hour  Intake 500 ml  Output 1350 ml  Net -850 ml   Filed Weights   08/16/23 0358  Weight: 78 kg     Exam:  GEN: NAD SKIN: Warm and dry EYES: No pallor or icterus, PERRLA, EOMI ENT: MMM CV: RRR PULM: CTA B ABD: soft, ND, NT, +BS CNS: AAO x 1 (person), no pallor or icterus EXT: No edema or tenderness      Data Reviewed:   I have personally reviewed following labs and imaging studies:  Labs: Labs show the following:   Basic Metabolic Panel: Recent Labs  Lab 08/15/23 0134 08/15/23 0448 08/15/23 1114 08/16/23 0450  NA 134*  --  136 136  K 3.7  --  3.6 3.6  CL 104  --  107 108  CO2 20*  --  20* 22  GLUCOSE 230*  --  128* 123*  BUN 18  --  16 21  CREATININE 1.32*  --  1.12 1.04  CALCIUM 8.9  --  7.8* 7.6*  MG  --  1.6*  --  2.2  PHOS  --  3.2  --  3.0   GFR Estimated Creatinine Clearance: 58.5 mL/min (by C-G formula based on SCr of 1.04 mg/dL). Liver Function Tests: Recent Labs  Lab 08/15/23 0134  AST 16  ALT 15  ALKPHOS 81  BILITOT 0.7  PROT 6.8  ALBUMIN 3.5   No results for input(s): "LIPASE", "AMYLASE" in the last 168 hours. No results for input(s): "AMMONIA" in the last 168 hours. Coagulation profile Recent Labs  Lab 08/15/23 0448  INR 1.1    CBC: Recent Labs  Lab 08/15/23 0134  WBC 8.5  NEUTROABS 7.5  HGB 12.7*  HCT 37.1*  MCV 88.1  PLT 221   Cardiac Enzymes: No results for input(s): "CKTOTAL", "CKMB", "CKMBINDEX", "TROPONINI" in the last 168 hours. BNP (last 3 results) No results for input(s): "PROBNP" in the last 8760 hours. CBG: Recent Labs  Lab 08/15/23 1606 08/15/23 1921 08/15/23 2305 08/16/23 0357 08/16/23 0737  GLUCAP 105* 133* 109* 117* 118*   D-Dimer: No results for input(s): "DDIMER" in the last 72 hours. Hgb A1c: Recent Labs    08/15/23 0448  HGBA1C 10.8*   Lipid Profile: Recent Labs    08/15/23 0702  CHOL 129  HDL 36*  LDLCALC 79  TRIG 68  CHOLHDL 3.6   Thyroid function studies: No results for input(s): "TSH", "T4TOTAL", "T3FREE", "THYROIDAB" in the last 72 hours.  Invalid input(s):  "FREET3" Anemia work up: No results for input(s): "VITAMINB12", "FOLATE", "FERRITIN", "TIBC", "IRON", "RETICCTPCT" in the last 72 hours. Sepsis Labs: Recent Labs  Lab 08/15/23 0134 08/15/23 0448  PROCALCITON  --  0.20  WBC 8.5  --   LATICACIDVEN 1.6  --     Microbiology Recent Results (from  the past 240 hour(s))  Culture, blood (routine x 2)     Status: None (Preliminary result)   Collection Time: 08/15/23  1:34 AM   Specimen: BLOOD  Result Value Ref Range Status   Specimen Description BLOOD  LFA  Final   Special Requests   Final    BOTTLES DRAWN AEROBIC AND ANAEROBIC Blood Culture adequate volume   Culture   Final    NO GROWTH 1 DAY Performed at [facility], [address] [place], [place] [address]    Report Status PENDING  Incomplete  Culture, blood (routine x 2)     Status: None (Preliminary result)   Collection Time: 08/15/23  1:34 AM   Specimen: BLOOD  Result Value Ref Range Status   Specimen Description BLOOD  RFA  Final   Special Requests   Final    BOTTLES DRAWN AEROBIC AND ANAEROBIC Blood Culture adequate volume   Culture   Final    NO GROWTH 1 DAY Performed at [facility], [address], [place], [place] [address]    Report Status PENDING  Incomplete  Urine Culture     Status: None (Preliminary result)   Collection Time: 08/15/23  1:34 AM   Specimen: Urine, Catheterized  Result Value Ref Range Status   Specimen Description   Final    URINE, CATHETERIZED Performed at [facility], [address], [place], [place] [address]    Special Requests   Final    NONE Reflexed from [id number] Performed at [facility], [address] [place] [place], [place] [address]    Culture   Final    CULTURE REINCUBATED FOR BETTER GROWTH Performed at [facility], [address], [place], [place] [address]    Report Status PENDING  Incomplete  SARS Coronavirus 2 by RT PCR (hospital order, performed in [facility] hospital lab) *cepheid  single result test* Anterior Nasal Swab     Status: None   Collection Time: 08/15/23  4:48 AM   Specimen: Anterior Nasal Swab  Result Value Ref Range Status   SARS Coronavirus 2 by RT PCR NEGATIVE NEGATIVE Final    Comment: (NOTE) SARS-CoV-2 target nucleic acids are NOT DETECTED.  The SARS-CoV-2 RNA is generally detectable in upper and lower respiratory specimens during the acute phase of infection. The lowest concentration of SARS-CoV-2 viral copies this assay can detect is 250 copies / mL. A negative result does not preclude SARS-CoV-2 infection and should not be used as the sole basis for treatment or other patient management decisions.  A negative result may occur with improper specimen collection / handling, submission of specimen other than nasopharyngeal swab, presence of viral mutation(s) within the areas targeted by this assay, and inadequate number of viral copies (<250 copies / mL). A negative result must be combined with clinical observations, patient history, and epidemiological information.  Fact Sheet for Patients:   RoadLapTop.co.za  Fact Sheet for Healthcare Providers: http://kim-miller.com/  This test is not yet approved or  cleared by the [place] FDA and has been authorized for detection and/or diagnosis of SARS-CoV-2 by FDA under an Emergency Use Authorization (EUA).  This EUA will remain in effect (meaning this test can be used) for the duration of the COVID-19 declaration under Section 564(b)(1) of the Act, 21 U.S.C. section 360bbb-3(b)(1), unless the authorization is terminated or revoked sooner.  Performed at [facility], [address] [place], [place] [address]   MRSA Next Gen by PCR, Nasal  Status: None   Collection Time: 08/15/23  6:43 AM   Specimen: Nasal Mucosa; Nasal Swab  Result Value Ref Range Status   MRSA by PCR Next Gen NOT DETECTED NOT DETECTED Final    Comment: (NOTE) The  GeneXpert MRSA Assay (FDA approved for NASAL specimens only), is one component of a comprehensive MRSA colonization surveillance program. It is not intended to diagnose MRSA infection nor to guide or monitor treatment for MRSA infections. Test performance is not FDA approved in patients less than 61 years old. Performed at [facility], [address] [place], [place] [address]     Procedures and diagnostic studies:  CT ANGIO HEAD NECK W WO CM  Result Date: 08/15/2023 CLINICAL DATA:  Provided history: Stroke, hemorrhagic. Evaluate for vertebrobasilar stenosis. EXAM: CT ANGIOGRAPHY HEAD AND NECK WITH AND WITHOUT CONTRAST TECHNIQUE: Multidetector CT imaging of the head and neck was performed using the standard protocol during bolus administration of intravenous contrast. Multiplanar CT image reconstructions and MIPs were obtained to evaluate the vascular anatomy. Carotid stenosis measurements (when applicable) are obtained utilizing NASCET criteria, using the distal internal carotid diameter as the denominator. RADIATION DOSE REDUCTION: This exam was performed according to the departmental dose-optimization program which includes automated exposure control, adjustment of the mA and/or kV according to patient size and/or use of iterative reconstruction technique. CONTRAST:  75mL OMNIPAQUE IOHEXOL 350 MG/ML SOLN COMPARISON:  Brain MRI 08/15/2023. FINDINGS: CT HEAD FINDINGS Brain: Generalized cerebral and cerebellar atrophy. Prominence of the ventricles and sulci appears commensurate. Acute parenchymal hemorrhage within the right occipital lobe with surrounding edema, grossly unchanged from the brain MRI and non-contrast head CT examinations performed earlier today. Chronic cortical/subcortical left MCA territory infarcts within the left frontal lobe/insula and left parietal lobe. Chronic lacunar infarcts again within/bout the bilateral basal ganglia. Background cerebral white matter and pontine  chronic small vessel disease, overall better delineated on the brain MRI performed earlier today. Small chronic infarcts within the left cerebellar hemisphere. No midline shift. Vascular: No hyperdense vessel.  Atherosclerotic calcifications. Skull: No acute calvarial fracture or aggressive osseous lesion. Prior right mastoidectomy. Sinuses/Orbits: No orbital mass or acute orbital finding. No significant paranasal sinus disease. Review of the MIP images confirms the above findings CTA NECK FINDINGS Aortic arch: Standard aortic branching. Atherosclerotic plaque within the visualized thoracic aorta and proximal major branch vessels of the neck. No innominate or proximal right subclavian artery stenosis. 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 Right carotid system: CCA and ICA patent within the neck without measurable stenosis. Mild atherosclerotic plaque scattered within the CCA and about the carotid bifurcation. Left carotid system: CCA and ICA patent within the neck. Atherosclerotic plaque scattered throughout the CCA, about the carotid bifurcation and within the proximal ICA. Most notably, atherosclerotic plaque within the proximal ICA results in less than 50% stenosis Vertebral arteries: The vertebral arteries are patent within the neck. Moderate/severe atherosclerotic stenosis at the right vertebral artery origin. Apparent moderate stenosis within the left vertebral artery V1 segment (however, this could potentially be artifactual and related to beam hardening artifact at this level). Moderate stenosis of the left vertebral artery V2 segment at the C4-C5 level. Skeleton: Prior ACDF at the C4-C7 levels. Hardware remains at C4-C5 and C6-C7. Superimposed cervical spondylosis. No acute fracture or aggressive osseous lesion. Other neck: No neck mass or cervical lymphadenopathy. Upper chest: No consolidation within the imaged lung apices. Review of the  MIP images confirms the above findings  CTA HEAD FINDINGS Anterior circulation: The intracranial internal carotid arteries are patent. Atherosclerotic plaque within both vessels with no more than mild stenosis. The M1 middle cerebral arteries are patent. Mild stenosis within the right middle cerebral artery proximal M1 segment. Atherosclerotic irregularity of the M2 and more distal MCA vessels, bilaterally. No M2 proximal branch occlusion or high-grade proximal stenosis. The anterior cerebral arteries are patent. No intracranial aneurysm is identified. Posterior circulation: The intracranial vertebral arteries are patent. 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 The posterior cerebral arteries are patent. Atherosclerotic irregularity of both vessels. Most notably, there is a severe stenosis within the left PCA P2 segment. Posterior communicating arteries are present bilaterally. Venous sinuses: Evaluation for dural venous sinus thrombosis is limited by contrast timing. Anatomic variants: As described. Review of the MIP images confirms the above findings. CTA head impression #2 will be called to the ordering clinician or representative by the Radiologist Assistant, and communication documented in the PACS or [employer]. IMPRESSION: Non-contrast head CT: 1. Acute parenchymal hemorrhage within the right occipital lobe with surrounding edema, unchanged from the brain MRI and head CT examinations performed earlier today. 2. Background parenchymal atrophy, chronic small vessel ischemic disease and chronic infarcts as described. CTA neck: 1. The common carotid and internal carotid arteries are patent within the neck without hemodynamically significant stenosis (50% or greater). Atherosclerotic plaque bilaterally, as described. 2. The vertebral arteries are patent within the neck. Moderate/severe atherosclerotic narrowing at the right vertebral artery origin. Apparent moderate stenosis  within the left vertebral artery V1 segment (however, this could potentially be artifactual and related to beam hardening artifact at this level). Moderate stenosis within the left vertebral artery V2 segment. 3. 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 4. Aortic Atherosclerosis (ICD10-I70.0). CTA head: 1. Intracranial atherosclerotic disease with multifocal stenoses, most notably as follows. 2. 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 3. Severe stenosis within the left posterior cerebral artery P2 segment. Electronically Signed   By: Jackey Loge D.O.   On: 08/15/2023 09:03   MR BRAIN WO CONTRAST  Addendum Date: 08/15/2023   ADDENDUM REPORT: 08/15/2023 05:52 ADDENDUM: Study discussed by telephone with Dr. Dolores Frame in the ED on 08/15/2023 at 0547 hours. Electronically Signed   By: Odessa Fleming M.D.   On: 08/15/2023 05:52   Result Date: 08/15/2023 CLINICAL DATA:  77 year old male with altered mental status. Right occipital lobe hemorrhage on head CT this morning. EXAM: MRI HEAD WITHOUT CONTRAST TECHNIQUE: Multiplanar, multiecho pulse sequences of the brain and surrounding structures were obtained without intravenous contrast. COMPARISON:  Head CT 0219 hours today.  Prior brain MRI 12/06/2019. FINDINGS: Brain: Intra-axial hemorrhage within the medial right occipital lobe is T2 and T1 signal heterogeneous, mostly T1 isointense (sagittal image 11). The blood products in compass roughly 19 x 23 by 35 mm on MRI (AP by transverse by CC) for an estimated volume of 7-8 mL. Surrounding T2 and FLAIR hyperintense vasogenic edema. No intraventricular extension. No convincing extra-axial extension. DWI susceptibility in the area of hemorrhage with no larger area of abnormal diffusion. No other restricted diffusion. No midline shift or significant intracranial mass effect. Cervicomedullary junction and pituitary are within normal limits. Ventricular  enlargement is chronic but mildly increased since 2021 and favored to be ex vacuo in nature. On SWI chronic microhemorrhages in the bilateral cerebellum, along with increased small chronic cerebellar infarcts since 2021. Posterior right temporal lobe, and both frontal lobes  are new, for about a half dozen areas of chronic cerebral blood products overall. Superimposed chronic left MCA territory infarction and encephalomalacia which is evolved from acute findings on the 2021 MRI. Additional mild scattered cerebral but moderate pontine chronic T2 and FLAIR heterogeneity. Vascular: Partial loss of vertebrobasilar flow voids is new since 2021 and suggests poor flow in the posterior circulation such as on series 15, image 80. Anterior circulation flow voids appear stable. Skull and upper cervical spine: Negative. Visualized bone marrow signal is within normal limits. Sinuses/Orbits: Stable, negative. Other: Chronic right mastoidectomy. IMPRESSION: 1. Evidence of poor flow in the vertebrobasilar arteries, new from a 2021 MRI. CTA would best evaluate further. And progressed small chronic cerebellar infarcts and microhemorrhages since that time. 2. Right occipital lobe intra-axial hemorrhage redemonstrated, estimated 7-8 mL by MRI. Regional vasogenic edema with no significant mass effect, no IVH or extra-axial extension. 3. Progressed chronic Left MCA territory ischemia and small number of bilateral cerebral microhemorrhages also since 2021. The SWI appearance does not rise to the level of amyloid angiopathy at this time. Electronically Signed: By: Odessa Fleming M.D. On: 08/15/2023 05:44   DG Abd 1 View  Result Date: 08/15/2023 CLINICAL DATA:  MRI clearance. EXAM: ABDOMEN - 1 VIEW COMPARISON:  08/15/2023 FINDINGS: One-view abdomen shows nonspecific bowel gas pattern. No unexpected radiopaque soft tissue foreign body. Telemetry leads overlie the chest. IMPRESSION: No unexpected radiopaque soft tissue foreign body.  Electronically Signed   By: Kennith Center M.D.   On: 08/15/2023 05:14   CT Head Wo Contrast  Result Date: 08/15/2023 CLINICAL DATA:  Altered mental status EXAM: CT HEAD WITHOUT CONTRAST TECHNIQUE: Contiguous axial images were obtained from the base of the skull through the vertex without intravenous contrast. RADIATION DOSE REDUCTION: This exam was performed according to the departmental dose-optimization program which includes automated exposure control, adjustment of the mA and/or kV according to patient size and/or use of iterative reconstruction technique. COMPARISON:  None Available. FINDINGS: Brain: There is acute intraparenchymal hemorrhage within the right occipital lobe, measuring 1.8 x 1.3 cm. Mild surrounding edema. No midline shift or other mass effect. There is generalized volume loss. Old left MCA territory infarct. Vascular: There is atherosclerotic calcification of both internal carotid arteries at the skull base. Skull: Remote right mastoidectomy Sinuses/Orbits: No acute finding. Other: None. IMPRESSION: 1. Acute intraparenchymal hemorrhage within the right occipital lobe, measuring 1.8 x 1.3 cm. Mild surrounding edema. No midline shift or other mass effect. 2. Old left MCA territory infarct. Critical Value/emergent results were called by telephone at the time of interpretation on 08/15/2023 at 2:51 am to provider JADE SUNG , who verbally acknowledged these results. Electronically Signed   By: Deatra Robinson M.D.   On: 08/15/2023 02:51   DG Chest Port 1 View  Result Date: 08/15/2023 CLINICAL DATA:  Altered mental status. EXAM: PORTABLE CHEST 1 VIEW COMPARISON:  February 12, 2023 FINDINGS: Limited study secondary to patient rotation. The heart size and mediastinal contours are within normal limits. Low lung volumes are noted with very mild atelectasis and/or infiltrate seen along the periphery of the right lung base. No pleural effusion or pneumothorax is identified. Radiopaque fusion plates and  screws are seen overlying the cervical spine with multilevel degenerative changes noted throughout the thoracic spine. IMPRESSION: Low lung volumes with very mild lateral right basilar atelectasis and/or infiltrate. Electronically Signed   By: Aram Candela M.D.   On: 08/15/2023 01:45  LOS: 1 day   Matai Carpenito  [facility]   Pager on [email or web address]. If 7PM-7AM, please contact night-coverage at [email or web address]     08/16/2023, 9:10 AM

## 2023-08-17 DIAGNOSIS — I611 Nontraumatic intracerebral hemorrhage in hemisphere, cortical: Secondary | ICD-10-CM | POA: Diagnosis not present

## 2023-08-17 LAB — GLUCOSE, CAPILLARY
Glucose-Capillary: 164 mg/dL — ABNORMAL HIGH (ref 70–99)
Glucose-Capillary: 144 mg/dL — ABNORMAL HIGH (ref 70–99)
Glucose-Capillary: 325 mg/dL — ABNORMAL HIGH (ref 70–99)
Glucose-Capillary: 335 mg/dL — ABNORMAL HIGH (ref 70–99)
Glucose-Capillary: 275 mg/dL — ABNORMAL HIGH (ref 70–99)
Glucose-Capillary: 136 mg/dL — ABNORMAL HIGH (ref 70–99)
Glucose-Capillary: 118 mg/dL — ABNORMAL HIGH (ref 70–99)

## 2023-08-17 LAB — URINE CULTURE

## 2023-08-17 LAB — BASIC METABOLIC PANEL
Anion gap: 9 (ref 5–15)
BUN: 25 mg/dL — ABNORMAL HIGH (ref 8–23)
CO2: 20 mmol/L — ABNORMAL LOW (ref 22–32)
Calcium: 7.8 mg/dL — ABNORMAL LOW (ref 8.9–10.3)
Chloride: 104 mmol/L (ref 98–111)
Creatinine, Ser: 1.07 mg/dL (ref 0.61–1.24)
GFR, Estimated: >60 mL/min (ref >60)
Glucose, Bld: 161 mg/dL — ABNORMAL HIGH (ref 70–99)
Potassium: 3.4 mmol/L — ABNORMAL LOW (ref 3.5–5.1)
Sodium: 133 mmol/L — ABNORMAL LOW (ref 135–145)

## 2023-08-17 MED ORDER — LORAZEPAM 2 MG/ML PO CONC: 1.0000 mg | ORAL | Status: DC | PRN

## 2023-08-17 MED ORDER — SODIUM CHLORIDE 0.9 % IV SOLN
2.0000 g | Freq: Four times a day (QID) | INTRAVENOUS | Status: DC
  Administered 2023-08-17: 2 g via INTRAVENOUS
  Filled 2023-08-17 (×2): qty 2000

## 2023-08-17 MED ORDER — LORAZEPAM 2 MG/ML IJ SOLN
1.0000 mg | Freq: Once | INTRAMUSCULAR | Status: AC
  Administered 2023-08-17: 1 mg via INTRAVENOUS
  Filled 2023-08-17: qty 1

## 2023-08-17 MED ORDER — POTASSIUM CHLORIDE CRYS ER 20 MEQ PO TBCR
40.0000 meq | EXTENDED_RELEASE_TABLET | Freq: Once | ORAL | Status: AC
  Administered 2023-08-17: 40 meq via ORAL
  Filled 2023-08-17: qty 2

## 2023-08-17 MED ORDER — MORPHINE SULFATE (CONCENTRATE) 10 MG /0.5 ML PO SOLN
5.0000 mg | ORAL | Status: DC | PRN
  Administered 2023-08-17: 5 mg via SUBLINGUAL
  Filled 2023-08-17: qty 0.5

## 2023-08-17 MED ORDER — ACETAMINOPHEN 650 MG RE SUPP: 650.0000 mg | Freq: Four times a day (QID) | RECTAL | Status: DC | PRN

## 2023-08-17 MED ORDER — GLYCOPYRROLATE 1 MG PO TABS: 1.0000 mg | ORAL_TABLET | ORAL | Status: DC | PRN

## 2023-08-17 MED ORDER — GLYCOPYRROLATE 0.2 MG/ML IJ SOLN: 0.2000 mg | INTRAMUSCULAR | Status: DC | PRN

## 2023-08-17 MED ORDER — ONDANSETRON HCL 4 MG/2ML IJ SOLN: 4.0000 mg | Freq: Four times a day (QID) | INTRAMUSCULAR | Status: DC | PRN

## 2023-08-17 MED ORDER — LORAZEPAM 2 MG/ML IJ SOLN
1.0000 mg | INTRAMUSCULAR | Status: DC | PRN
  Administered 2023-08-17: 1 mg via INTRAVENOUS
  Filled 2023-08-17: qty 1

## 2023-08-17 MED ORDER — ONDANSETRON 4 MG PO TBDP: 4.0000 mg | ORAL_TABLET | Freq: Four times a day (QID) | ORAL | Status: DC | PRN

## 2023-08-17 MED ORDER — INSULIN GLARGINE-YFGN 100 UNIT/ML ~~LOC~~ SOLN
15.0000 [IU] | Freq: Every day | SUBCUTANEOUS | Status: DC
  Administered 2023-08-18: 15 [IU] via SUBCUTANEOUS
  Filled 2023-08-17 (×3): qty 0.15

## 2023-08-17 MED ORDER — POLYVINYL ALCOHOL 1.4 % OP SOLN: 1.0000 [drp] | Freq: Four times a day (QID) | OPHTHALMIC | Status: DC | PRN

## 2023-08-17 MED ORDER — HALOPERIDOL LACTATE 5 MG/ML IJ SOLN
0.5000 mg | INTRAMUSCULAR | Status: DC | PRN
  Administered 2023-08-17: 0.5 mg via INTRAVENOUS
  Filled 2023-08-17: qty 1

## 2023-08-17 MED ORDER — ACETAMINOPHEN 325 MG PO TABS: 650.0000 mg | ORAL_TABLET | Freq: Four times a day (QID) | ORAL | Status: DC | PRN

## 2023-08-17 MED ORDER — MORPHINE SULFATE (CONCENTRATE) 10 MG /0.5 ML PO SOLN: 5.0000 mg | ORAL | Status: DC | PRN

## 2023-08-17 MED ORDER — BIOTENE DRY MOUTH MT LIQD: 15.0000 mL | OROMUCOSAL | Status: DC | PRN

## 2023-08-17 MED ORDER — HALOPERIDOL LACTATE 2 MG/ML PO CONC: 0.5000 mg | ORAL | Status: DC | PRN

## 2023-08-17 MED ORDER — AMOXICILLIN 500 MG PO CAPS
500.0000 mg | ORAL_CAPSULE | Freq: Three times a day (TID) | ORAL | Status: DC
  Filled 2023-08-17 (×3): qty 1

## 2023-08-17 MED ORDER — LORAZEPAM 1 MG PO TABS
1.0000 mg | ORAL_TABLET | ORAL | Status: DC | PRN
  Administered 2023-08-18 – 2023-08-19 (×2): 1 mg via ORAL
  Filled 2023-08-17 (×3): qty 1

## 2023-08-17 MED ORDER — HALOPERIDOL 0.5 MG PO TABS: 0.5000 mg | ORAL_TABLET | ORAL | Status: DC | PRN

## 2023-08-17 NOTE — Progress Notes (Signed)
PHARMACY CONSULT NOTE - FOLLOW UP  Pharmacy Consult for Electrolyte Monitoring and Replacement   Recent Labs: Potassium (mmol/L)  Date Value  08/17/2023 3.4 (L)  09/13/2013 4.0   Magnesium (mg/dL)  Date Value  16/06/9603 2.2   Calcium (mg/dL)  Date Value  54/05/8118 7.8 (L)   Calcium, Total (mg/dL)  Date Value  14/78/2956 9.1   Albumin (g/dL)  Date Value  21/30/8657 3.5  09/13/2013 3.6   Phosphorus (mg/dL)  Date Value  84/69/6295 3.0   Sodium (mmol/L)  Date Value  08/17/2023 133 (L)  09/13/2013 138     Assessment: 77 y/o male with h/o HLD, HTN, DM, anxiety, OCD, stroke, CVA (2021), tremor, DJD, dementia and recently diagnosed UTI who is admitted with AKI, SIRS and ICH. Pharmacy is asked to follow and replace electrolytes.  Goal of Therapy:  Electrolytes WNL  Plan:  ---40 mEq po KCl x 1 ---recheck electrolytes in am  Lowella Bandy ,PharmD Clinical Pharmacist 08/17/2023 6:56 AM

## 2023-08-17 NOTE — Plan of Care (Signed)
  Problem: Education: Goal: Knowledge of disease or condition will improve Outcome: Progressing Goal: Knowledge of secondary prevention will improve (MUST DOCUMENT ALL) Outcome: Progressing Goal: Knowledge of patient specific risk factors will improve Loraine Leriche N/A or DELETE if not current risk factor) Outcome: Progressing   Problem: Intracerebral Hemorrhage Tissue Perfusion: Goal: Complications of Intracerebral Hemorrhage will be minimized Outcome: Progressing   Problem: Coping: Goal: Will verbalize positive feelings about self Outcome: Progressing Goal: Will identify appropriate support needs Outcome: Progressing   Problem: Health Behavior/Discharge Planning: Goal: Ability to manage health-related needs will improve Outcome: Progressing Goal: Goals will be collaboratively established with patient/family Outcome: Progressing   Problem: Self-Care: Goal: Ability to participate in self-care as condition permits will improve Outcome: Progressing Goal: Verbalization of feelings and concerns over difficulty with self-care will improve Outcome: Progressing Goal: Ability to communicate needs accurately will improve Outcome: Progressing   Problem: Nutrition: Goal: Risk of aspiration will decrease Outcome: Progressing Goal: Dietary intake will improve Outcome: Progressing   Problem: Education: Goal: Ability to describe self-care measures that may prevent or decrease complications (Diabetes Survival Skills Education) will improve Outcome: Progressing Goal: Individualized Educational Video(s) Outcome: Progressing   Problem: Coping: Goal: Ability to adjust to condition or change in health will improve Outcome: Progressing   Problem: Fluid Volume: Goal: Ability to maintain a balanced intake and output will improve Outcome: Progressing   Problem: Health Behavior/Discharge Planning: Goal: Ability to identify and utilize available resources and services will improve Outcome:  Progressing Goal: Ability to manage health-related needs will improve Outcome: Progressing   Problem: Metabolic: Goal: Ability to maintain appropriate glucose levels will improve Outcome: Progressing   Problem: Nutritional: Goal: Maintenance of adequate nutrition will improve Outcome: Progressing Goal: Progress toward achieving an optimal weight will improve Outcome: Progressing   Problem: Skin Integrity: Goal: Risk for impaired skin integrity will decrease Outcome: Progressing   Problem: Tissue Perfusion: Goal: Adequacy of tissue perfusion will improve Outcome: Progressing   Problem: Education: Goal: Knowledge of General Education information will improve Description: Including pain rating scale, medication(s)/side effects and non-pharmacologic comfort measures Outcome: Progressing   Problem: Health Behavior/Discharge Planning: Goal: Ability to manage health-related needs will improve Outcome: Progressing   Problem: Clinical Measurements: Goal: Ability to maintain clinical measurements within normal limits will improve Outcome: Progressing Goal: Will remain free from infection Outcome: Progressing Goal: Diagnostic test results will improve Outcome: Progressing Goal: Respiratory complications will improve Outcome: Progressing Goal: Cardiovascular complication will be avoided Outcome: Progressing   Problem: Activity: Goal: Risk for activity intolerance will decrease Outcome: Progressing   Problem: Nutrition: Goal: Adequate nutrition will be maintained Outcome: Progressing   Problem: Coping: Goal: Level of anxiety will decrease Outcome: Progressing   Problem: Elimination: Goal: Will not experience complications related to bowel motility Outcome: Progressing Goal: Will not experience complications related to urinary retention Outcome: Progressing   Problem: Pain Management: Goal: General experience of comfort will improve Outcome: Progressing   Problem:  Safety: Goal: Ability to remain free from injury will improve Outcome: Progressing   Problem: Skin Integrity: Goal: Risk for impaired skin integrity will decrease Outcome: Progressing   Problem: Education: Goal: Knowledge of the prescribed therapeutic regimen will improve Outcome: Progressing   Problem: Coping: Goal: Ability to identify and develop effective coping behavior will improve Outcome: Progressing

## 2023-08-17 NOTE — Progress Notes (Signed)
Speech Language Pathology Treatment: Dysphagia  Patient Details Name: Walter Harris MRN: [id number] DOB: 1946-08-04 Today's Date: 08/17/2023 Time: 9629-5284 SLP Time Calculation (min) (ACUTE ONLY): 10 min  Assessment / Plan / Recommendation Clinical Impression  Pt seen for diet tolerance. Per RN, pt has been consuming food since yesterday including a cheeseburger and a "big breakfast." Pt initiated on a Dysphagia 3 diet with Thin Liquids this AM per chart review. GOC discussions ongoing and pt possibly transitioning to comfort care. Pt observed with regular solids, puree, and thin liquids. Pt demonstrated s/sx mild oral dysphagia c/b prolonged mastication of solids which is likely due to dental status and exacerbated by mental status. Pharyngeal swallow appeared [facility] per clinical assessment. Pt benefited from intermittent assistance/steadying while feeding self. Recommend continuation of current diet, as initiated by MD, with safe swallowing strategies/aspiration precautions as outlined below. SLP to sign off as pt has no acute SLP needs. Pt and RN made aware of results, recommendations, and SLP POC. ?full understanding by pt.    HPI HPI: Walter Harris is a 77 y.o. male brought to the ED via EMS from [facility] with a chief complaint of altered mental status.  History of dementia.  Recently diagnosed with UTI. MRi revealed 1. Evidence of poor flow in the vertebrobasilar arteries, new from a 2021 MRI. CTA would best evaluate further. And progressed small chronic cerebellar infarcts and microhemorrhages since that time. 2. Right occipital lobe intra-axial hemorrhage redemonstrated, estimated 7-8 mL by MRI. Regional vasogenic edema with no significant mass effect, no IVH or extra-axial extension. 3. Progressed chronic Left MCA territory ischemia and small number of bilateral cerebral microhemorrhages also since 2021. The SWI appearance does not rise to the level of amyloid angiopathy at this time.       SLP Plan  All goals met      Recommendations for follow up therapy are one component of a multi-disciplinary discharge planning process, led by the attending physician.  Recommendations may be updated based on patient status, additional functional criteria and insurance authorization.    Recommendations  Diet recommendations: Dysphagia 3 (mechanical soft);Thin liquid Liquids provided via: Straw Medication Administration: Crushed with puree (vs whole with puree) Supervision: Staff to assist with self feeding;Full supervision/cueing for compensatory strategies Compensations: Minimize environmental distractions;Slow rate;Small sips/bites Postural Changes and/or Swallow Maneuvers: Out of bed for meals;Seated upright 90 degrees;Upright 30-60 min after meal                  Oral care QID;Staff/trained caregiver to provide oral care     Dysphagia, unspecified (R13.10)     All goals met    Clyde Canterbury, M.S., CCC-SLP Speech-Language Pathologist [facility] [phone] [name])  [name]  08/17/2023, 11:07 AM

## 2023-08-17 NOTE — Progress Notes (Signed)
Daily Progress Note   Patient Name: Walter Harris       Date: 08/17/2023 DOB: 1946-02-11  Age: 77 y.o. MRN#: [id number] Attending Physician: Lurene Shadow, MD Primary Care Physician: Joaquim Nam, MD Admit Date: 08/15/2023  Reason for Consultation/Follow-up: Establishing goals of care  HPI/Brief Hospital Review:  77 y.o. male  with past medical history of dementia, hypertension, hyperlipidemia, CVA essential tremors and type 2 diabetes admitted from [facility] on 08/15/2023 with altered mental status.  Recent diagnosis of UTI reported from facility staff.   ED imaging revealed acute intraparenchymal hemorrhage within right occipital lobe no shift or mass effect   Palliative medicine was consulted for assisting with goals of care conversations  Subjective: Extensive chart review has been completed prior to meeting patient including labs, vital signs, imaging, progress notes, orders, and available advanced directive documents from current and previous encounters.    Visited with Walter Harris at his bedside. He is awake, alert, unable to engage in conversations.  Sister-Walter Harris and step daughter-Walter Harris at bedside during time of visit. Have met with them separately at earlier times. We reviewed current condition and goals of care moving forward.  Both Walter Harris and Walter Harris on clear in that they wish to proceed with comfort measures. They are also clear they wish for [name] to return to [facility] with hospice following, aware LC utilizes their own in facility hospice agency.  Walter Harris would no longer receive aggressive medical interventions such as continuous vital signs, lab work, radiology testing, or medications not focused on comfort. All care would focus on how the patient is looking  and feeling. This would include management of any symptoms that may cause discomfort, pain, shortness of breath, cough, nausea, agitation, anxiety, and/or secretions etc. Symptoms would be managed with medications and other non-pharmacological interventions such as spiritual support if requested, repositioning, music therapy, or therapeutic listening. Family verbalized understanding and appreciation. Walter Harris and Walter Harris verbalizes understanding and agreement.   Comfort orders placed: Morphine PRN for pain/air hunger/comfort Robinul PRN for excessive secretions Ativan PRN for agitation/anxiety Zofran PRN for nausea Liquifilm tears PRN for dry eyes Haldol PRN for agitation/anxiety May have comfort feeding Comfort cart for family Unrestricted visitations in the setting of EOL (per policy) Oxygen PRN 2L or less for comfort. No escalation.    Engaged  with TOC to assist with transfer back to [facility] under hospice services.  Care plan was discussed with nursing staff, primary team and TOC.  Thank you for allowing the Palliative Medicine Team to assist in the care of this patient.  Total time:  35 minutes  Time spent includes: Detailed review of medical records (labs, imaging, vital signs), medically appropriate exam (mental status, respiratory, cardiac, skin), discussed with treatment team, counseling and educating patient, family and staff, documenting clinical information, medication management and coordination of care.  Leeanne Deed, DNP, AGNP-C Palliative Medicine   Please contact Palliative Medicine Team phone at [phone] for questions and concerns.

## 2023-08-17 NOTE — Progress Notes (Signed)
Progress Note    Walter Harris  WUJ:[id number] DOB: 11-16-1945  DOA: 08/15/2023 PCP: Joaquim Nam, MD      Brief Narrative:    Medical records reviewed and are as summarized below:  Walter Harris is a 77 y.o. male with medical history significant for recently diagnosed UTI, dementia, hypertension, hyperlipidemia, type II DM, CAD, history of stroke, essential tremors, hard of hearing, basal cell carcinoma, degenerative joint disease, who was brought from the nursing home to the ED because of acute change in mental status. He was febrile with temperature of 102.7 F, tachypneic, tachycardic and hypertensive in the ED.       Assessment/Plan:   Principal Problem:   ICH (intracerebral hemorrhage) (HCC) Active Problems:   Uncontrolled type 2 diabetes mellitus with hyperglycemia (HCC)   UTI (urinary tract infection)   AKI (acute kidney injury) (HCC)   Elevated troponin   Dementia (HCC)   Fever   Intraparenchymal hemorrhage of brain (HCC)   Nutrition Problem: Severe Malnutrition Etiology: social / environmental circumstances  Signs/Symptoms: severe fat depletion, severe muscle depletion   Body mass index is 22.43 kg/m.    Intracranial hemorrhage in the right occipital lobe with regional vasogenic edema: Patient has been transitioned to comfort care.  Discontinue Keppra.   Sepsis secondary to acute UTI: Urine culture showed Enterococcus faecalis.  He was started on IV Unasyn today based on susceptibility report.  Switch IV Unasyn to oral amoxicillin.  No growth on blood cultures    AKI, non-anion gap metabolic acidosis, hyponatremia: Creatinine has improved.  He has fluctuating sodium and bicarbonate levels.   Hypomagnesemia: Improved   Acute metabolic encephalopathy on underlying dementia: mental status is better and is probably back to baseline   Type II DM: NovoLog as needed for hyperglycemia. Start insulin glargine at 15 units nightly. He was  taking Lantus 20 units nightly and metformin prior to admission.   History of mild left MCA stroke (infarct)   Plan of care was discussed with [name], sister and HPOA at the bedside.  She has already had discussions with Ladona Ridgel, NP, with palliative care.  She has made a decision to transition patient to comfort care.  However, she is okay with antibiotics for UTI and also requested that we continue to monitor his blood sugars and treat with insulin accordingly so long as he is eating.  He understands that patient will not have blood tests.  She also wants all other medicines to be discontinued with focus on comfort care.   Diet Order             DIET DYS 3 Room service appropriate? Yes; Fluid consistency: Thin  Diet effective now                            Consultants: Neurologist  Procedures: None    Medications:    Chlorhexidine Gluconate Cloth  6 each Topical Daily   insulin aspart  0-15 Units Subcutaneous Q4H   senna-docusate  1 tablet Oral BID   Continuous Infusions:  ampicillin (OMNIPEN) IV 2 g (08/17/23 1314)   levETIRAcetam Stopped (08/17/23 0216)     Anti-infectives (From admission, onward)    Start     Dose/Rate Route Frequency Ordered Stop   08/17/23 1300  ampicillin (OMNIPEN) 2 g in sodium chloride 0.9 % 100 mL IVPB        2 g 300 mL/hr over 20 Minutes Intravenous  Every 6 hours 08/17/23 1212     08/15/23 0330  azithromycin (ZITHROMAX) 500 mg in sodium chloride 0.9 % 250 mL IVPB        500 mg 250 mL/hr over 60 Minutes Intravenous  Once 08/15/23 0321 08/15/23 0443   08/15/23 0145  cefTRIAXone (ROCEPHIN) 2 g in sodium chloride 0.9 % 100 mL IVPB  Status:  Discontinued        2 g 200 mL/hr over 30 Minutes Intravenous Every 24 hours 08/15/23 0133 08/17/23 1212              Family Communication/Anticipated D/C date and plan/Code Status   DVT prophylaxis:      Code Status: Do not attempt resuscitation (DNR) - Comfort care  Family  Communication: Plan discussed with Willie, sister and HPOA, at the bedside Disposition Plan: Plan to discharge to SNF   Status is: Inpatient Remains inpatient appropriate because: Intracranial hemorrhage       Subjective:   No acute events overnight.  He has no complaints.  He is still confused.  Irving Burton, RN, reported that patient has been more alert and he is eating okay without any problems.  Objective:    Vitals:   08/17/23 0900 08/17/23 1200 08/17/23 1205 08/17/23 1328  BP: 138/77 130/75    Pulse:   95 86  Resp: 16 17 20 17   Temp:   98.3 F (36.8 C)   TempSrc:   Axillary   SpO2:   95% 99%  Weight:      Height:       No data found.   Intake/Output Summary (Last 24 hours) at 08/17/2023 1343 Last data filed at 08/17/2023 1314 Gross per 24 hour  Intake 1500.28 ml  Output 1500 ml  Net 0.28 ml   Filed Weights   08/16/23 0358 08/17/23 0400  Weight: 78 kg 66.9 kg    Exam:  GEN: NAD SKIN: Warm and dry EYES: No pallor or icterus ENT: MMM CV: RRR PULM: CTA B ABD: soft, ND, NT, +BS CNS: AAO x 1 (person), non focal EXT: No edema or tenderness      Data Reviewed:   I have personally reviewed following labs and imaging studies:  Labs: Labs show the following:   Basic Metabolic Panel: Recent Labs  Lab 08/15/23 0134 08/15/23 0448 08/15/23 1114 08/16/23 0450 08/17/23 0345  NA 134*  --  136 136 133*  K 3.7  --  3.6 3.6 3.4*  CL 104  --  107 108 104  CO2 20*  --  20* 22 20*  GLUCOSE 230*  --  128* 123* 161*  BUN 18  --  16 21 25*  CREATININE 1.32*  --  1.12 1.04 1.07  CALCIUM 8.9  --  7.8* 7.6* 7.8*  MG  --  1.6*  --  2.2  --   PHOS  --  3.2  --  3.0  --    GFR Estimated Creatinine Clearance: 55.6 mL/min (by C-G formula based on SCr of 1.07 mg/dL). Liver Function Tests: Recent Labs  Lab 08/15/23 0134  AST 16  ALT 15  ALKPHOS 81  BILITOT 0.7  PROT 6.8  ALBUMIN 3.5   No results for input(s): "LIPASE", "AMYLASE" in the last 168 hours. No  results for input(s): "AMMONIA" in the last 168 hours. Coagulation profile Recent Labs  Lab 08/15/23 0448  INR 1.1    CBC: Recent Labs  Lab 08/15/23 0134  WBC 8.5  NEUTROABS 7.5  HGB 12.7*  HCT 37.1*  MCV 88.1  PLT 221   Cardiac Enzymes: No results for input(s): "CKTOTAL", "CKMB", "CKMBINDEX", "TROPONINI" in the last 168 hours. BNP (last 3 results) No results for input(s): "PROBNP" in the last 8760 hours. CBG: Recent Labs  Lab 08/16/23 2355 08/17/23 0332 08/17/23 0750 08/17/23 1143 08/17/23 1155  GLUCAP 224* 164* 144* 325* 335*   D-Dimer: No results for input(s): "DDIMER" in the last 72 hours. Hgb A1c: Recent Labs    08/15/23 0448  HGBA1C 10.8*   Lipid Profile: Recent Labs    08/15/23 0702  CHOL 129  HDL 36*  LDLCALC 79  TRIG 68  CHOLHDL 3.6   Thyroid function studies: No results for input(s): "TSH", "T4TOTAL", "T3FREE", "THYROIDAB" in the last 72 hours.  Invalid input(s): "FREET3" Anemia work up: No results for input(s): "VITAMINB12", "FOLATE", "FERRITIN", "TIBC", "IRON", "RETICCTPCT" in the last 72 hours. Sepsis Labs: Recent Labs  Lab 08/15/23 0134 08/15/23 0448  PROCALCITON  --  0.20  WBC 8.5  --   LATICACIDVEN 1.6  --     Microbiology Recent Results (from the past 240 hour(s))  Culture, blood (routine x 2)     Status: None (Preliminary result)   Collection Time: 08/15/23  1:34 AM   Specimen: BLOOD  Result Value Ref Range Status   Specimen Description BLOOD  LFA  Final   Special Requests   Final    BOTTLES DRAWN AEROBIC AND ANAEROBIC Blood Culture adequate volume   Culture   Final    NO GROWTH 2 DAYS Performed at [facility] [address], [place], [place] [address]    Report Status PENDING  Incomplete  Culture, blood (routine x 2)     Status: None (Preliminary result)   Collection Time: 08/15/23  1:34 AM   Specimen: BLOOD  Result Value Ref Range Status   Specimen Description BLOOD  RFA  Final   Special Requests    Final    BOTTLES DRAWN AEROBIC AND ANAEROBIC Blood Culture adequate volume   Culture   Final    NO GROWTH 2 DAYS Performed at [facility], [address], [place], [place] [address]    Report Status PENDING  Incomplete  Urine Culture     Status: Abnormal   Collection Time: 08/15/23  1:34 AM   Specimen: Urine, Catheterized  Result Value Ref Range Status   Specimen Description   Final    URINE, CATHETERIZED Performed at [facility], [address], [place], [place] [address]    Special Requests   Final    NONE Reflexed from [id number] Performed at [facility], [address] [place], [place] [address]    Culture >=100,000 COLONIES/mL ENTEROCOCCUS FAECALIS (A)  Final   Report Status 08/17/2023 FINAL  Final   Organism ID, Bacteria ENTEROCOCCUS FAECALIS (A)  Final      Susceptibility   Enterococcus faecalis - MIC*    AMPICILLIN <=2 SENSITIVE Sensitive     VANCOMYCIN 1 SENSITIVE Sensitive     * >=100,000 COLONIES/mL ENTEROCOCCUS FAECALIS  SARS Coronavirus 2 by RT PCR (hospital order, performed in [facility] lab) *cepheid single result test* Anterior Nasal Swab     Status: None   Collection Time: 08/15/23  4:48 AM   Specimen: Anterior Nasal Swab  Result Value Ref Range Status   SARS Coronavirus 2 by RT PCR NEGATIVE NEGATIVE Final    Comment: (NOTE) SARS-CoV-2 target nucleic acids are NOT DETECTED.  The SARS-CoV-2 RNA is generally  detectable in upper and lower respiratory specimens during the acute phase of infection. The lowest concentration of SARS-CoV-2 viral copies this assay can detect is 250 copies / mL. A negative result does not preclude SARS-CoV-2 infection and should not be used as the sole basis for treatment or other patient management decisions.  A negative result may occur with improper specimen collection / handling, submission of specimen other than nasopharyngeal swab, presence of viral mutation(s) within the areas targeted by  this assay, and inadequate number of viral copies (<250 copies / mL). A negative result must be combined with clinical observations, patient history, and epidemiological information.  Fact Sheet for Patients:   RoadLapTop.co.za  Fact Sheet for Healthcare Providers: http://kim-miller.com/  This test is not yet approved or  cleared by the [place] FDA and has been authorized for detection and/or diagnosis of SARS-CoV-2 by FDA under an Emergency Use Authorization (EUA).  This EUA will remain in effect (meaning this test can be used) for the duration of the COVID-19 declaration under Section 564(b)(1) of the Act, 21 U.S.C. section 360bbb-3(b)(1), unless the authorization is terminated or revoked sooner.  Performed at [facility], [address] [place], [place] [address]   MRSA Next Gen by PCR, Nasal     Status: None   Collection Time: 08/15/23  6:43 AM   Specimen: Nasal Mucosa; Nasal Swab  Result Value Ref Range Status   MRSA by PCR Next Gen NOT DETECTED NOT DETECTED Final    Comment: (NOTE) The GeneXpert MRSA Assay (FDA approved for NASAL specimens only), is one component of a comprehensive MRSA colonization surveillance program. It is not intended to diagnose MRSA infection nor to guide or monitor treatment for MRSA infections. Test performance is not FDA approved in patients less than 71 years old. Performed at [facility], [address] [place], [place] [address]     Procedures and diagnostic studies:  No results found.             LOS: 2 days   Janella Rogala  [facility]   Pager on [email or web address]. If 7PM-7AM, please contact night-coverage at [email or web address]     08/17/2023, 1:43 PM

## 2023-08-17 NOTE — Plan of Care (Signed)
  Problem: Education: Goal: Knowledge of disease or condition will improve Outcome: Not Progressing Goal: Knowledge of secondary prevention will improve (MUST DOCUMENT ALL) Outcome: Not Progressing Goal: Knowledge of patient specific risk factors will improve Loraine Leriche N/A or DELETE if not current risk factor) Outcome: Not Progressing   Problem: Intracerebral Hemorrhage Tissue Perfusion: Goal: Complications of Intracerebral Hemorrhage will be minimized Outcome: Progressing   Problem: Coping: Goal: Will verbalize positive feelings about self Outcome: Not Progressing Goal: Will identify appropriate support needs Outcome: Not Progressing   Problem: Health Behavior/Discharge Planning: Goal: Ability to manage health-related needs will improve Outcome: Not Progressing Goal: Goals will be collaboratively established with patient/family Outcome: Progressing   Problem: Self-Care: Goal: Ability to participate in self-care as condition permits will improve Outcome: Progressing Goal: Verbalization of feelings and concerns over difficulty with self-care will improve Outcome: Not Progressing Goal: Ability to communicate needs accurately will improve Outcome: Not Progressing   Problem: Nutrition: Goal: Risk of aspiration will decrease Outcome: Progressing Goal: Dietary intake will improve Outcome: Progressing   Problem: Education: Goal: Ability to describe self-care measures that may prevent or decrease complications (Diabetes Survival Skills Education) will improve Outcome: Not Progressing Goal: Individualized Educational Video(s) Outcome: Not Progressing   Problem: Coping: Goal: Ability to adjust to condition or change in health will improve Outcome: Progressing   Problem: Fluid Volume: Goal: Ability to maintain a balanced intake and output will improve Outcome: Not Progressing   Problem: Health Behavior/Discharge Planning: Goal: Ability to identify and utilize available resources  and services will improve Outcome: Not Progressing Goal: Ability to manage health-related needs will improve Outcome: Not Progressing   Problem: Metabolic: Goal: Ability to maintain appropriate glucose levels will improve Outcome: Progressing   Problem: Nutritional: Goal: Maintenance of adequate nutrition will improve Outcome: Not Progressing Goal: Progress toward achieving an optimal weight will improve Outcome: Progressing   Problem: Skin Integrity: Goal: Risk for impaired skin integrity will decrease Outcome: Progressing   Problem: Tissue Perfusion: Goal: Adequacy of tissue perfusion will improve Outcome: Progressing   Problem: Education: Goal: Knowledge of General Education information will improve Description: Including pain rating scale, medication(s)/side effects and non-pharmacologic comfort measures Outcome: Not Progressing   Problem: Health Behavior/Discharge Planning: Goal: Ability to manage health-related needs will improve Outcome: Not Progressing   Problem: Clinical Measurements: Goal: Ability to maintain clinical measurements within normal limits will improve Outcome: Progressing Goal: Will remain free from infection Outcome: Progressing Goal: Diagnostic test results will improve Outcome: Progressing Goal: Respiratory complications will improve Outcome: Progressing Goal: Cardiovascular complication will be avoided Outcome: Progressing   Problem: Activity: Goal: Risk for activity intolerance will decrease Outcome: Progressing   Problem: Nutrition: Goal: Adequate nutrition will be maintained Outcome: Not Progressing   Problem: Coping: Goal: Level of anxiety will decrease Outcome: Progressing   Problem: Elimination: Goal: Will not experience complications related to bowel motility Outcome: Progressing Goal: Will not experience complications related to urinary retention Outcome: Progressing   Problem: Pain Management: Goal: General experience  of comfort will improve Outcome: Progressing   Problem: Safety: Goal: Ability to remain free from injury will improve Outcome: Progressing   Problem: Skin Integrity: Goal: Risk for impaired skin integrity will decrease Outcome: Not Progressing  Pt resting comfortably in bed. Denies pain. Oriented to self only - hx of dementia. Remains on room air. Male purewick in place. Vital signs remain stable.

## 2023-08-18 DIAGNOSIS — I611 Nontraumatic intracerebral hemorrhage in hemisphere, cortical: Secondary | ICD-10-CM | POA: Diagnosis not present

## 2023-08-18 LAB — GLUCOSE, CAPILLARY
Glucose-Capillary: 175 mg/dL — ABNORMAL HIGH (ref 70–99)
Glucose-Capillary: 261 mg/dL — ABNORMAL HIGH (ref 70–99)
Glucose-Capillary: 287 mg/dL — ABNORMAL HIGH (ref 70–99)
Glucose-Capillary: 240 mg/dL — ABNORMAL HIGH (ref 70–99)
Glucose-Capillary: 289 mg/dL — ABNORMAL HIGH (ref 70–99)

## 2023-08-18 MED ORDER — SODIUM CHLORIDE 0.9 % IV SOLN
1.0000 g | Freq: Four times a day (QID) | INTRAVENOUS | Status: DC
  Administered 2023-08-18 – 2023-08-19 (×4): 1 g via INTRAVENOUS
  Filled 2023-08-18 (×8): qty 1000

## 2023-08-18 MED ORDER — MORPHINE SULFATE (PF) 2 MG/ML IV SOLN: 2.0000 mg | INTRAVENOUS | Status: DC | PRN

## 2023-08-18 NOTE — Progress Notes (Addendum)
Progress Note    Walter Harris  YTK:[id number] DOB: 14-Sep-1945  DOA: 08/15/2023 PCP: Joaquim Nam, MD      Brief Narrative:    Medical records reviewed and are as summarized below:  Walter Harris is a 77 y.o. male with medical history significant for recently diagnosed UTI, dementia, hypertension, hyperlipidemia, type II DM, CAD, history of stroke, essential tremors, hard of hearing, basal cell carcinoma, degenerative joint disease, who was brought from the nursing home to the ED because of acute change in mental status. He was febrile with temperature of 102.7 F, tachypneic, tachycardic and hypertensive in the ED.       Assessment/Plan:   Principal Problem:   ICH (intracerebral hemorrhage) (HCC) Active Problems:   Uncontrolled type 2 diabetes mellitus with hyperglycemia (HCC)   UTI (urinary tract infection)   AKI (acute kidney injury) (HCC)   Elevated troponin   Dementia (HCC)   Fever   Intraparenchymal hemorrhage of brain (HCC)   Nutrition Problem: Severe Malnutrition Etiology: social / environmental circumstances  Signs/Symptoms: severe fat depletion, severe muscle depletion   Body mass index is 22.43 kg/m.    Intracranial hemorrhage in the right occipital lobe with regional vasogenic edema: Patient has been transitioned to comfort care.     Sepsis secondary to acute UTI: Urine culture showed Enterococcus faecalis.   Amoxicillin was switched to IV because she was too lethargic to take anything by mouth earlier today. Plan to switch back to amoxicillin if he remains alert.   AKI, non-anion gap metabolic acidosis, hyponatremia: Creatinine has improved.  He has fluctuating sodium and bicarbonate levels.   Hypomagnesemia: Improved   Acute metabolic encephalopathy on underlying dementia: Fluctuating mental status.  He was very lethargic and barely arousable earlier this morning but completely awake and alert later in the afternoon.   Type II  DM: I had a long discussion with Willie (HPOA) about diabetes management with insulin and frequent glucose checks in patients on comfort measures.  I explained to Lawtey that insulin therapy and glucose checks were usually not done in comfort care patients. [name] prefers that patient continue to have glucose checks and insulin injections based on glucose levels so long as he continues to eat.  She just saw him finish his dinner plate She said she will have further discussions with her daughter regarding management of diabetes mellitus going forward.  He was taking Lantus 20 units nightly and metformin prior to admission.   History of mild left MCA stroke (infarct)   Patient will remain on comfort care for now. Hospital doctor, Charity fundraiser, has been updated about plan of care    Diet Order             DIET DYS 3 Room service appropriate? Yes; Fluid consistency: Thin  Diet effective now                            Consultants: Neurologist  Procedures: None    Medications:    insulin aspart  0-15 Units Subcutaneous Q4H   insulin glargine-yfgn  15 Units Subcutaneous QHS   senna-docusate  1 tablet Oral BID   Continuous Infusions:  ampicillin (OMNIPEN) IV Stopped (08/18/23 1148)   levETIRAcetam Stopped (08/18/23 1559)     Anti-infectives (From admission, onward)    Start     Dose/Rate Route Frequency Ordered Stop   08/18/23 1200  ampicillin (OMNIPEN) 1 g in sodium chloride 0.9 % 100  mL IVPB        1 g 300 mL/hr over 20 Minutes Intravenous Every 6 hours 08/18/23 0804     08/17/23 2200  amoxicillin (AMOXIL) capsule 500 mg  Status:  Discontinued        500 mg Oral Every 8 hours 08/17/23 1407 08/18/23 0804   08/17/23 1300  ampicillin (OMNIPEN) 2 g in sodium chloride 0.9 % 100 mL IVPB  Status:  Discontinued        2 g 300 mL/hr over 20 Minutes Intravenous Every 6 hours 08/17/23 1212 08/17/23 1407   08/15/23 0330  azithromycin (ZITHROMAX) 500 mg in sodium chloride 0.9 % 250 mL  IVPB        500 mg 250 mL/hr over 60 Minutes Intravenous  Once 08/15/23 0321 08/15/23 0443   08/15/23 0145  cefTRIAXone (ROCEPHIN) 2 g in sodium chloride 0.9 % 100 mL IVPB  Status:  Discontinued        2 g 200 mL/hr over 30 Minutes Intravenous Every 24 hours 08/15/23 0133 08/17/23 1212              Family Communication/Anticipated D/C date and plan/Code Status   DVT prophylaxis:      Code Status: Do not attempt resuscitation (DNR) - Comfort care  Family Communication: Plan discussed with Willie, sister and HPOA, at the bedside Disposition Plan: Plan to discharge to SNF   Status is: Inpatient Remains inpatient appropriate because: Intracranial hemorrhage       Subjective:   Interval events noted. [name], RN, reported that patient has been lethargic and has not been eating.  She is concerned about giving him insulin because of poor oral intake.  Patient was very lethargic when I saw him this morning in the ICU around 9:15 AM.  I went back to see him 5:13 PM after he had been moved out of the ICU to the MedSurg unit.  Objective:    Vitals:   08/18/23 0600 08/18/23 0800 08/18/23 1133 08/18/23 1329  BP:   132/74 (!) 141/77  Pulse: 94 93  86  Resp: 15 19 17 17   Temp:  98.4 F (36.9 C)  97.8 F (36.6 C)  TempSrc:  Axillary    SpO2: 98% 95%  98%  Weight:      Height:       No data found.   Intake/Output Summary (Last 24 hours) at 08/18/2023 1702 Last data filed at 08/18/2023 1603 Gross per 24 hour  Intake 400 ml  Output 2750 ml  Net -2350 ml   Filed Weights   08/16/23 0358 08/17/23 0400  Weight: 78 kg 66.9 kg   Physical exam in the morning while patient was in the ICU Exam:  GEN: NAD SKIN: Warm and dry EYES: No pallor or icterus ENT: MMM CV: RRR PULM: CTA B ABD: soft, ND, NT, +BS CNS: Lethargic but arousable EXT: No edema or tenderness  Physical exam in the evening while patient was on MedSurg unit  Patient was completely awake and alert  but confused.  He was being fed by the nursing assistant at the bedside.  [name], sister, was also at the bedside.   Data Reviewed:   I have personally reviewed following labs and imaging studies:  Labs: Labs show the following:   Basic Metabolic Panel: Recent Labs  Lab 08/15/23 0134 08/15/23 0448 08/15/23 1114 08/16/23 0450 08/17/23 0345  NA 134*  --  136 136 133*  K 3.7  --  3.6 3.6 3.4*  CL  104  --  107 108 104  CO2 20*  --  20* 22 20*  GLUCOSE 230*  --  128* 123* 161*  BUN 18  --  16 21 25*  CREATININE 1.32*  --  1.12 1.04 1.07  CALCIUM 8.9  --  7.8* 7.6* 7.8*  MG  --  1.6*  --  2.2  --   PHOS  --  3.2  --  3.0  --    GFR Estimated Creatinine Clearance: 55.6 mL/min (by C-G formula based on SCr of 1.07 mg/dL). Liver Function Tests: Recent Labs  Lab 08/15/23 0134  AST 16  ALT 15  ALKPHOS 81  BILITOT 0.7  PROT 6.8  ALBUMIN 3.5   No results for input(s): "LIPASE", "AMYLASE" in the last 168 hours. No results for input(s): "AMMONIA" in the last 168 hours. Coagulation profile Recent Labs  Lab 08/15/23 0448  INR 1.1    CBC: Recent Labs  Lab 08/15/23 0134  WBC 8.5  NEUTROABS 7.5  HGB 12.7*  HCT 37.1*  MCV 88.1  PLT 221   Cardiac Enzymes: No results for input(s): "CKTOTAL", "CKMB", "CKMBINDEX", "TROPONINI" in the last 168 hours. BNP (last 3 results) No results for input(s): "PROBNP" in the last 8760 hours. CBG: Recent Labs  Lab 08/17/23 2316 08/18/23 0336 08/18/23 0737 08/18/23 1024 08/18/23 1627  GLUCAP 118* 175* 261* 287* 240*   D-Dimer: No results for input(s): "DDIMER" in the last 72 hours. Hgb A1c: No results for input(s): "HGBA1C" in the last 72 hours.  Lipid Profile: No results for input(s): "CHOL", "HDL", "LDLCALC", "TRIG", "CHOLHDL", "LDLDIRECT" in the last 72 hours.  Thyroid function studies: No results for input(s): "TSH", "T4TOTAL", "T3FREE", "THYROIDAB" in the last 72 hours.  Invalid input(s): "FREET3" Anemia work up: No  results for input(s): "VITAMINB12", "FOLATE", "FERRITIN", "TIBC", "IRON", "RETICCTPCT" in the last 72 hours. Sepsis Labs: Recent Labs  Lab 08/15/23 0134 08/15/23 0448  PROCALCITON  --  0.20  WBC 8.5  --   LATICACIDVEN 1.6  --     Microbiology Recent Results (from the past 240 hour(s))  Culture, blood (routine x 2)     Status: None (Preliminary result)   Collection Time: 08/15/23  1:34 AM   Specimen: BLOOD  Result Value Ref Range Status   Specimen Description BLOOD  LFA  Final   Special Requests   Final    BOTTLES DRAWN AEROBIC AND ANAEROBIC Blood Culture adequate volume   Culture   Final    NO GROWTH 3 DAYS Performed at [facility], [address], [place], [place] [address]    Report Status PENDING  Incomplete  Culture, blood (routine x 2)     Status: None (Preliminary result)   Collection Time: 08/15/23  1:34 AM   Specimen: BLOOD  Result Value Ref Range Status   Specimen Description BLOOD  RFA  Final   Special Requests   Final    BOTTLES DRAWN AEROBIC AND ANAEROBIC Blood Culture adequate volume   Culture   Final    NO GROWTH 3 DAYS Performed at [facility], [address] [place], [place] [address]    Report Status PENDING  Incomplete  Urine Culture     Status: Abnormal   Collection Time: 08/15/23  1:34 AM   Specimen: Urine, Catheterized  Result Value Ref Range Status   Specimen Description   Final    URINE, CATHETERIZED Performed at [facility], [address] [place], [place] [address]  Special Requests   Final    NONE Reflexed from [id number] Performed at [facility], [address] [place], [place] [address]    Culture >=100,000 COLONIES/mL ENTEROCOCCUS FAECALIS (A)  Final   Report Status 08/17/2023 FINAL  Final   Organism ID, Bacteria ENTEROCOCCUS FAECALIS (A)  Final      Susceptibility   Enterococcus faecalis - MIC*    AMPICILLIN <=2 SENSITIVE Sensitive     VANCOMYCIN 1 SENSITIVE Sensitive     * >=100,000  COLONIES/mL ENTEROCOCCUS FAECALIS  SARS Coronavirus 2 by RT PCR (hospital order, performed in [facility] hospital lab) *cepheid single result test* Anterior Nasal Swab     Status: None   Collection Time: 08/15/23  4:48 AM   Specimen: Anterior Nasal Swab  Result Value Ref Range Status   SARS Coronavirus 2 by RT PCR NEGATIVE NEGATIVE Final    Comment: (NOTE) SARS-CoV-2 target nucleic acids are NOT DETECTED.  The SARS-CoV-2 RNA is generally detectable in upper and lower respiratory specimens during the acute phase of infection. The lowest concentration of SARS-CoV-2 viral copies this assay can detect is 250 copies / mL. A negative result does not preclude SARS-CoV-2 infection and should not be used as the sole basis for treatment or other patient management decisions.  A negative result may occur with improper specimen collection / handling, submission of specimen other than nasopharyngeal swab, presence of viral mutation(s) within the areas targeted by this assay, and inadequate number of viral copies (<250 copies / mL). A negative result must be combined with clinical observations, patient history, and epidemiological information.  Fact Sheet for Patients:   RoadLapTop.co.za  Fact Sheet for Healthcare Providers: http://kim-miller.com/  This test is not yet approved or  cleared by the [place] FDA and has been authorized for detection and/or diagnosis of SARS-CoV-2 by FDA under an Emergency Use Authorization (EUA).  This EUA will remain in effect (meaning this test can be used) for the duration of the COVID-19 declaration under Section 564(b)(1) of the Act, 21 U.S.C. section 360bbb-3(b)(1), unless the authorization is terminated or revoked sooner.  Performed at [facility], [address] [place], [place] [address]   MRSA Next Gen by PCR, Nasal     Status: None   Collection Time: 08/15/23  6:43 AM   Specimen: Nasal  Mucosa; Nasal Swab  Result Value Ref Range Status   MRSA by PCR Next Gen NOT DETECTED NOT DETECTED Final    Comment: (NOTE) The GeneXpert MRSA Assay (FDA approved for NASAL specimens only), is one component of a comprehensive MRSA colonization surveillance program. It is not intended to diagnose MRSA infection nor to guide or monitor treatment for MRSA infections. Test performance is not FDA approved in patients less than 6 years old. Performed at [facility], [address] [place], [place] [address]     Procedures and diagnostic studies:  No results found.             LOS: 3 days   Tosha Belgarde  [facility]   Pager on [email or web address]. If 7PM-7AM, please contact night-coverage at [email or web address]     08/18/2023, 5:02 PM

## 2023-08-19 DIAGNOSIS — I611 Nontraumatic intracerebral hemorrhage in hemisphere, cortical: Secondary | ICD-10-CM | POA: Diagnosis not present

## 2023-08-19 LAB — GLUCOSE, CAPILLARY
Glucose-Capillary: 112 mg/dL — ABNORMAL HIGH (ref 70–99)
Glucose-Capillary: 162 mg/dL — ABNORMAL HIGH (ref 70–99)
Glucose-Capillary: 174 mg/dL — ABNORMAL HIGH (ref 70–99)
Glucose-Capillary: 209 mg/dL — ABNORMAL HIGH (ref 70–99)

## 2023-08-19 MED ORDER — AMOXICILLIN 500 MG PO CAPS
500.0000 mg | ORAL_CAPSULE | Freq: Three times a day (TID) | ORAL | Status: DC
  Administered 2023-08-19 – 2023-08-20 (×3): 500 mg via ORAL
  Filled 2023-08-19 (×4): qty 1

## 2023-08-19 MED ORDER — AMOXICILLIN 500 MG PO CAPS: 500.0000 mg | ORAL_CAPSULE | Freq: Three times a day (TID) | ORAL | Status: AC

## 2023-08-19 MED ORDER — BISACODYL 10 MG RE SUPP
10.0000 mg | Freq: Once | RECTAL | Status: DC
  Filled 2023-08-19: qty 1

## 2023-08-19 NOTE — Care Management Important Message (Signed)
Important Message  Patient Details  Name: Walter Harris MRN: [id number] Date of Birth: 1946-03-07   Important Message Given:  Other (see comment)  Patient is on comfort care with plan to return to [facility] with Hospice. Out of respect for the patient and family no Important Message from [facility] given.   [name] A Analy Bassford 08/19/2023, 9:46 AM

## 2023-08-19 NOTE — Discharge Summary (Addendum)
Physician Discharge Summary   Patient: Walter Harris MRN: [id number] DOB: 1945/09/27  Admit date:     08/15/2023  Discharge date: 08/20/23  Discharge Physician: Lurene Shadow   PCP: Joaquim Nam, MD   Recommendations at discharge:   Follow-up with Hospice team at the long-term care facility within 1 to 2 days of discharge  Discharge Diagnoses: Principal Problem:   ICH (intracerebral hemorrhage) (HCC) Active Problems:   Uncontrolled type 2 diabetes mellitus with hyperglycemia (HCC)   UTI (urinary tract infection)   AKI (acute kidney injury) (HCC)   Elevated troponin   Dementia (HCC)   Fever   Intraparenchymal hemorrhage of brain (HCC)  Resolved Problems:   * No resolved hospital problems. *  Hospital Course:  Walter Harris is a 77 y.o. male with medical history significant for recently diagnosed UTI, dementia, hypertension, hyperlipidemia, type II DM, CAD, history of stroke, essential tremors, hard of hearing, basal cell carcinoma, degenerative joint disease, who was brought from the nursing home to the ED because of acute change in mental status. He was febrile with temperature of 102.7 F, tachypneic, tachycardic and hypertensive in the ED.     Assessment and Plan:   Intracranial hemorrhage in the right occipital lobe with regional vasogenic edema: Patient has been transitioned to comfort care.       Sepsis secondary to acute UTI: Urine culture showed Enterococcus faecalis.  She was previously treated with IV ceftriaxone followed by IV Unasyn.  Switch to amoxicillin at discharge.  [name], sister, wants patient to complete antibiotics for UTI.     AKI, non-anion gap metabolic acidosis, hyponatremia: Creatinine has improved.  BMP was not checked again because he is on comfort care.     Hypomagnesemia: Improved     Acute metabolic encephalopathy on underlying dementia: Initially had fluctuating mental status but he now appears to be at baseline..     Type II DM  with hyperglycemia: [name], sister and HPOA, has decided not to treat his diabetes again.  She said she had discussed it with the patient's stepdaughter.  She requested that glucose checks and insulin therapy be discontinued.   History of mild left MCA stroke (infarct)     He is medically stable for discharge at this time.  He will be discharged to [facility] long-term care facility for hospice care. Discharge plan discussed with [name], sister and HPOA, at the bedside. He had a good bowel movement today and can be discharged tp [employer].     Consultants: Neurologist, palliative care Procedures performed: None Disposition: Long term care facility Diet recommendation:  Regular diet DISCHARGE MEDICATION: Allergies as of 08/19/2023       Reactions   Bee Venom Anaphylaxis   Pravastatin Other (See Comments)   Severe myalgias   Ace Inhibitors    Cough   Actos [pioglitazone] Other (See Comments)   Intolerant, not allergic.    Angiotensin Receptor Blockers Other (See Comments)   cramps   Aripiprazole Other (See Comments)   Unknown reaction many years ago   Crestor [rosuvastatin Calcium] Other (See Comments)   Aches, even with twice weekly dosing   Invokana [canagliflozin]    Edema   Metformin And Related Other (See Comments)   Able to tolerate XR formulation, not allergic.    Nsaids    Would avoid given creatinine elevation.   Olanzapine Other (See Comments)   Unknown reaction many years ago   Pneumovax 23 [pneumococcal Vac Polyvalent] Other (See Comments)  aches   Codeine Rash   Divalproex Sodium Other (See Comments)    tremor   Lithium Carbonate Other (See Comments)   Tremors   Sulfamethoxazole-trimethoprim Other (See Comments)   Mild reaction per Dr. Alphonsus Sias, pt does not remember the reaction        Medication List     STOP taking these medications    ALPRAZolam 0.5 MG tablet Commonly known as: XANAX   aspirin EC 81 MG tablet   BD Pen Needle  Nano U/F 32G X 4 MM Misc Generic drug: Insulin Pen Needle   cyanocobalamin 1000 MCG tablet   feeding supplement Liqd   FLUoxetine 10 MG capsule Commonly known as: PROZAC   folic acid 1 MG tablet Commonly known as: FOLVITE   glipiZIDE 10 MG 24 hr tablet Commonly known as: GLUCOTROL XL   irbesartan 150 MG tablet Commonly known as: AVAPRO   Lantus SoloStar 100 UNIT/ML Solostar Pen Generic drug: insulin glargine   metFORMIN 500 MG 24 hr tablet Commonly known as: GLUCOPHAGE-XR   multivitamin with minerals Tabs tablet   primidone 50 MG tablet Commonly known as: MYSOLINE   propranolol ER 60 MG 24 hr capsule Commonly known as: INDERAL LA   tiZANidine 4 MG tablet Commonly known as: ZANAFLEX   Vitamin D (Ergocalciferol) 1.25 MG (50000 UNIT) Caps capsule Commonly known as: DRISDOL       TAKE these medications    amoxicillin 500 MG capsule Commonly known as: AMOXIL Take 1 capsule (500 mg total) by mouth 3 (three) times daily for 3 days.   oxyCODONE-acetaminophen 5-325 MG tablet Commonly known as: PERCOCET/ROXICET TAKE 1-2 TABLETS BY MOUTH 3 TIMES DAILY AS NEEDED FOR PAIN. Fill on/after 60 days from rx   oxyCODONE-acetaminophen 5-325 MG tablet Commonly known as: PERCOCET/ROXICET TAKE ONE TO TWO TABLETS BY MOUTH 3 TIMES DAILY IF NEEDED FOR PAIN.   oxyCODONE-acetaminophen 5-325 MG tablet Commonly known as: PERCOCET/ROXICET TAKE ONE TO TWO TABLETS BY MOUTH 3 TIMESDAILY AS NEEDED FOR PAIN.  Fill on/after 30 days from rx   traZODone 50 MG tablet Commonly known as: DESYREL TAKE 1 AND 1/2 TABLETS BY MOUTH AT BEDTIME AS NEEDED FOR SLEEP        Discharge Exam: Filed Weights   08/16/23 0358 08/17/23 0400  Weight: 78 kg 66.9 kg   GEN: NAD SKIN: Warm and dry EYES: No pallor or icterus ENT: MMM CV: RRR PULM: CTA B ABD: soft, ND, NT, +BS CNS: AAO x 1 (person), non focal EXT: No edema or tenderness   Condition at discharge: good  The results of significant  diagnostics from this hospitalization (including imaging, microbiology, ancillary and laboratory) are listed below for reference.   Imaging Studies: CT ANGIO HEAD NECK W WO CM  Result Date: 08/15/2023 CLINICAL DATA:  Provided history: Stroke, hemorrhagic. Evaluate for vertebrobasilar stenosis. EXAM: CT ANGIOGRAPHY HEAD AND NECK WITH AND WITHOUT CONTRAST TECHNIQUE: Multidetector CT imaging of the head and neck was performed using the standard protocol during bolus administration of intravenous contrast. Multiplanar CT image reconstructions and MIPs were obtained to evaluate the vascular anatomy. Carotid stenosis measurements (when applicable) are obtained utilizing NASCET criteria, using the distal internal carotid diameter as the denominator. RADIATION DOSE REDUCTION: This exam was performed according to the departmental dose-optimization program which includes automated exposure control, adjustment of the mA and/or kV according to patient size and/or use of iterative reconstruction technique. CONTRAST:  75mL OMNIPAQUE IOHEXOL 350 MG/ML SOLN COMPARISON:  Brain MRI 08/15/2023. FINDINGS: CT HEAD FINDINGS  Brain: Generalized cerebral and cerebellar atrophy. Prominence of the ventricles and sulci appears commensurate. Acute parenchymal hemorrhage within the right occipital lobe with surrounding edema, grossly unchanged from the brain MRI and non-contrast head CT examinations performed earlier today. Chronic cortical/subcortical left MCA territory infarcts within the left frontal lobe/insula and left parietal lobe. Chronic lacunar infarcts again within/bout the bilateral basal ganglia. Background cerebral white matter and pontine chronic small vessel disease, overall better delineated on the brain MRI performed earlier today. Small chronic infarcts within the left cerebellar hemisphere. No midline shift. Vascular: No hyperdense vessel.  Atherosclerotic calcifications. Skull: No acute calvarial fracture or aggressive  osseous lesion. Prior right mastoidectomy. Sinuses/Orbits: No orbital mass or acute orbital finding. No significant paranasal sinus disease. Review of the MIP images confirms the above findings CTA NECK FINDINGS Aortic arch: Standard aortic branching. Atherosclerotic plaque within the visualized thoracic aorta and proximal major branch vessels of the neck. No innominate or proximal right subclavian artery stenosis. 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 Right carotid system: CCA and ICA patent within the neck without measurable stenosis. Mild atherosclerotic plaque scattered within the CCA and about the carotid bifurcation. Left carotid system: CCA and ICA patent within the neck. Atherosclerotic plaque scattered throughout the CCA, about the carotid bifurcation and within the proximal ICA. Most notably, atherosclerotic plaque within the proximal ICA results in less than 50% stenosis Vertebral arteries: The vertebral arteries are patent within the neck. Moderate/severe atherosclerotic stenosis at the right vertebral artery origin. Apparent moderate stenosis within the left vertebral artery V1 segment (however, this could potentially be artifactual and related to beam hardening artifact at this level). Moderate stenosis of the left vertebral artery V2 segment at the C4-C5 level. Skeleton: Prior ACDF at the C4-C7 levels. Hardware remains at C4-C5 and C6-C7. Superimposed cervical spondylosis. No acute fracture or aggressive osseous lesion. Other neck: No neck mass or cervical lymphadenopathy. Upper chest: No consolidation within the imaged lung apices. Review of the MIP images confirms the above findings CTA HEAD FINDINGS Anterior circulation: The intracranial internal carotid arteries are patent. Atherosclerotic plaque within both vessels with no more than mild stenosis. The M1 middle cerebral arteries are patent. Mild stenosis within the right middle  cerebral artery proximal M1 segment. Atherosclerotic irregularity of the M2 and more distal MCA vessels, bilaterally. No M2 proximal branch occlusion or high-grade proximal stenosis. The anterior cerebral arteries are patent. No intracranial aneurysm is identified. Posterior circulation: The intracranial vertebral arteries are patent. 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 The posterior cerebral arteries are patent. Atherosclerotic irregularity of both vessels. Most notably, there is a severe stenosis within the left PCA P2 segment. Posterior communicating arteries are present bilaterally. Venous sinuses: Evaluation for dural venous sinus thrombosis is limited by contrast timing. Anatomic variants: As described. Review of the MIP images confirms the above findings. CTA head impression #2 will be called to the ordering clinician or representative by the Radiologist Assistant, and communication documented in the PACS or [employer]. IMPRESSION: Non-contrast head CT: 1. Acute parenchymal hemorrhage within the right occipital lobe with surrounding edema, unchanged from the brain MRI and head CT examinations performed earlier today. 2. Background parenchymal atrophy, chronic small vessel ischemic disease and chronic infarcts as described. CTA neck: 1. The common carotid and internal carotid arteries are patent within the neck without hemodynamically significant stenosis (50% or greater). Atherosclerotic plaque bilaterally, as described. 2. The vertebral arteries are patent within  the neck. Moderate/severe atherosclerotic narrowing at the right vertebral artery origin. Apparent moderate stenosis within the left vertebral artery V1 segment (however, this could potentially be artifactual and related to beam hardening artifact at this level). Moderate stenosis within the left vertebral artery V2 segment. 3. Severe, near occlusive atherosclerotic stenosis of the proximal left subclavian  artery. 4. Aortic Atherosclerosis (ICD10-I70.0). CTA head: 1. Intracranial atherosclerotic disease with multifocal stenoses, most notably as follows. 2. 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 3. Severe stenosis within the left posterior cerebral artery P2 segment. Electronically Signed   By: Jackey Loge D.O.   On: 08/15/2023 09:03   MR BRAIN WO CONTRAST  Addendum Date: 08/15/2023   ADDENDUM REPORT: 08/15/2023 05:52 ADDENDUM: Study discussed by telephone with Dr. Dolores Frame in the ED on 08/15/2023 at 0547 hours. Electronically Signed   By: Odessa Fleming M.D.   On: 08/15/2023 05:52   Result Date: 08/15/2023 CLINICAL DATA:  77 year old male with altered mental status. Right occipital lobe hemorrhage on head CT this morning. EXAM: MRI HEAD WITHOUT CONTRAST TECHNIQUE: Multiplanar, multiecho pulse sequences of the brain and surrounding structures were obtained without intravenous contrast. COMPARISON:  Head CT 0219 hours today.  Prior brain MRI 12/06/2019. FINDINGS: Brain: Intra-axial hemorrhage within the medial right occipital lobe is T2 and T1 signal heterogeneous, mostly T1 isointense (sagittal image 11). The blood products in compass roughly 19 x 23 by 35 mm on MRI (AP by transverse by CC) for an estimated volume of 7-8 mL. Surrounding T2 and FLAIR hyperintense vasogenic edema. No intraventricular extension. No convincing extra-axial extension. DWI susceptibility in the area of hemorrhage with no larger area of abnormal diffusion. No other restricted diffusion. No midline shift or significant intracranial mass effect. Cervicomedullary junction and pituitary are within normal limits. Ventricular enlargement is chronic but mildly increased since 2021 and favored to be ex vacuo in nature. On SWI chronic microhemorrhages in the bilateral cerebellum, along with increased small chronic cerebellar infarcts since 2021. Posterior right temporal lobe, and both frontal lobes are new, for about a half  dozen areas of chronic cerebral blood products overall. Superimposed chronic left MCA territory infarction and encephalomalacia which is evolved from acute findings on the 2021 MRI. Additional mild scattered cerebral but moderate pontine chronic T2 and FLAIR heterogeneity. Vascular: Partial loss of vertebrobasilar flow voids is new since 2021 and suggests poor flow in the posterior circulation such as on series 15, image 80. Anterior circulation flow voids appear stable. Skull and upper cervical spine: Negative. Visualized bone marrow signal is within normal limits. Sinuses/Orbits: Stable, negative. Other: Chronic right mastoidectomy. IMPRESSION: 1. Evidence of poor flow in the vertebrobasilar arteries, new from a 2021 MRI. CTA would best evaluate further. And progressed small chronic cerebellar infarcts and microhemorrhages since that time. 2. Right occipital lobe intra-axial hemorrhage redemonstrated, estimated 7-8 mL by MRI. Regional vasogenic edema with no significant mass effect, no IVH or extra-axial extension. 3. Progressed chronic Left MCA territory ischemia and small number of bilateral cerebral microhemorrhages also since 2021. The SWI appearance does not rise to the level of amyloid angiopathy at this time. Electronically Signed: By: Odessa Fleming M.D. On: 08/15/2023 05:44   DG Abd 1 View  Result Date: 08/15/2023 CLINICAL DATA:  MRI clearance. EXAM: ABDOMEN - 1 VIEW COMPARISON:  08/15/2023 FINDINGS: One-view abdomen shows nonspecific bowel gas pattern. No unexpected radiopaque soft tissue foreign body. Telemetry leads overlie the chest. IMPRESSION: No unexpected radiopaque soft tissue foreign body. Electronically Signed  By: Kennith Center M.D.   On: 08/15/2023 05:14   CT Head Wo Contrast  Result Date: 08/15/2023 CLINICAL DATA:  Altered mental status EXAM: CT HEAD WITHOUT CONTRAST TECHNIQUE: Contiguous axial images were obtained from the base of the skull through the vertex without intravenous contrast.  RADIATION DOSE REDUCTION: This exam was performed according to the departmental dose-optimization program which includes automated exposure control, adjustment of the mA and/or kV according to patient size and/or use of iterative reconstruction technique. COMPARISON:  None Available. FINDINGS: Brain: There is acute intraparenchymal hemorrhage within the right occipital lobe, measuring 1.8 x 1.3 cm. Mild surrounding edema. No midline shift or other mass effect. There is generalized volume loss. Old left MCA territory infarct. Vascular: There is atherosclerotic calcification of both internal carotid arteries at the skull base. Skull: Remote right mastoidectomy Sinuses/Orbits: No acute finding. Other: None. IMPRESSION: 1. Acute intraparenchymal hemorrhage within the right occipital lobe, measuring 1.8 x 1.3 cm. Mild surrounding edema. No midline shift or other mass effect. 2. Old left MCA territory infarct. Critical Value/emergent results were called by telephone at the time of interpretation on 08/15/2023 at 2:51 am to provider JADE SUNG , who verbally acknowledged these results. Electronically Signed   By: Deatra Robinson M.D.   On: 08/15/2023 02:51   DG Chest Port 1 View  Result Date: 08/15/2023 CLINICAL DATA:  Altered mental status. EXAM: PORTABLE CHEST 1 VIEW COMPARISON:  February 12, 2023 FINDINGS: Limited study secondary to patient rotation. The heart size and mediastinal contours are within normal limits. Low lung volumes are noted with very mild atelectasis and/or infiltrate seen along the periphery of the right lung base. No pleural effusion or pneumothorax is identified. Radiopaque fusion plates and screws are seen overlying the cervical spine with multilevel degenerative changes noted throughout the thoracic spine. IMPRESSION: Low lung volumes with very mild lateral right basilar atelectasis and/or infiltrate. Electronically Signed   By: Aram Candela M.D.   On: 08/15/2023 01:45    Microbiology: Results  for orders placed or performed during the hospital encounter of 08/15/23  Culture, blood (routine x 2)     Status: None (Preliminary result)   Collection Time: 08/15/23  1:34 AM   Specimen: BLOOD  Result Value Ref Range Status   Specimen Description BLOOD  LFA  Final   Special Requests   Final    BOTTLES DRAWN AEROBIC AND ANAEROBIC Blood Culture adequate volume   Culture   Final    NO GROWTH 4 DAYS Performed at [facility], [address] [place], [place] [address]    Report Status PENDING  Incomplete  Culture, blood (routine x 2)     Status: None (Preliminary result)   Collection Time: 08/15/23  1:34 AM   Specimen: BLOOD  Result Value Ref Range Status   Specimen Description BLOOD  RFA  Final   Special Requests   Final    BOTTLES DRAWN AEROBIC AND ANAEROBIC Blood Culture adequate volume   Culture   Final    NO GROWTH 4 DAYS Performed at [facility], [address] [place], [place] [address]    Report Status PENDING  Incomplete  Urine Culture     Status: Abnormal   Collection Time: 08/15/23  1:34 AM   Specimen: Urine, Catheterized  Result Value Ref Range Status   Specimen Description   Final    URINE, CATHETERIZED Performed at [facility], [address], [place], [place] [address]    Special Requests  Final    NONE Reflexed from [id number] Performed at [facility], [address] [place], [place] [address]    Culture >=100,000 COLONIES/mL ENTEROCOCCUS FAECALIS (A)  Final   Report Status 08/17/2023 FINAL  Final   Organism ID, Bacteria ENTEROCOCCUS FAECALIS (A)  Final      Susceptibility   Enterococcus faecalis - MIC*    AMPICILLIN <=2 SENSITIVE Sensitive     VANCOMYCIN 1 SENSITIVE Sensitive     * >=100,000 COLONIES/mL ENTEROCOCCUS FAECALIS  SARS Coronavirus 2 by RT PCR (hospital order, performed in [facility] hospital lab) *cepheid single result test* Anterior Nasal Swab     Status: None   Collection Time: 08/15/23  4:48 AM    Specimen: Anterior Nasal Swab  Result Value Ref Range Status   SARS Coronavirus 2 by RT PCR NEGATIVE NEGATIVE Final    Comment: (NOTE) SARS-CoV-2 target nucleic acids are NOT DETECTED.  The SARS-CoV-2 RNA is generally detectable in upper and lower respiratory specimens during the acute phase of infection. The lowest concentration of SARS-CoV-2 viral copies this assay can detect is 250 copies / mL. A negative result does not preclude SARS-CoV-2 infection and should not be used as the sole basis for treatment or other patient management decisions.  A negative result may occur with improper specimen collection / handling, submission of specimen other than nasopharyngeal swab, presence of viral mutation(s) within the areas targeted by this assay, and inadequate number of viral copies (<250 copies / mL). A negative result must be combined with clinical observations, patient history, and epidemiological information.  Fact Sheet for Patients:   RoadLapTop.co.za  Fact Sheet for Healthcare Providers: http://kim-miller.com/  This test is not yet approved or  cleared by the [place] FDA and has been authorized for detection and/or diagnosis of SARS-CoV-2 by FDA under an Emergency Use Authorization (EUA).  This EUA will remain in effect (meaning this test can be used) for the duration of the COVID-19 declaration under Section 564(b)(1) of the Act, 21 U.S.C. section 360bbb-3(b)(1), unless the authorization is terminated or revoked sooner.  Performed at [facility], [address] [place], [place] [address]   MRSA Next Gen by PCR, Nasal     Status: None   Collection Time: 08/15/23  6:43 AM   Specimen: Nasal Mucosa; Nasal Swab  Result Value Ref Range Status   MRSA by PCR Next Gen NOT DETECTED NOT DETECTED Final    Comment: (NOTE) The GeneXpert MRSA Assay (FDA approved for NASAL specimens only), is one component of a comprehensive  MRSA colonization surveillance program. It is not intended to diagnose MRSA infection nor to guide or monitor treatment for MRSA infections. Test performance is not FDA approved in patients less than 90 years old. Performed at [facility], [address] [place], [place] [address]     Labs: CBC: Recent Labs  Lab 08/15/23 0134  WBC 8.5  NEUTROABS 7.5  HGB 12.7*  HCT 37.1*  MCV 88.1  PLT 221   Basic Metabolic Panel: Recent Labs  Lab 08/15/23 0134 08/15/23 0448 08/15/23 1114 08/16/23 0450 08/17/23 0345  NA 134*  --  136 136 133*  K 3.7  --  3.6 3.6 3.4*  CL 104  --  107 108 104  CO2 20*  --  20* 22 20*  GLUCOSE 230*  --  128* 123* 161*  BUN 18  --  16 21 25*  CREATININE 1.32*  --  1.12 1.04 1.07  CALCIUM 8.9  --  7.8* 7.6* 7.8*  MG  --  1.6*  --  2.2  --   PHOS  --  3.2  --  3.0  --    Liver Function Tests: Recent Labs  Lab 08/15/23 0134  AST 16  ALT 15  ALKPHOS 81  BILITOT 0.7  PROT 6.8  ALBUMIN 3.5   CBG: Recent Labs  Lab 08/18/23 1627 08/18/23 2004 08/19/23 0016 08/19/23 0344 08/19/23 0827  GLUCAP 240* 289* 162* 112* 174*    Discharge time spent: greater than 30 minutes.  Signed: Lurene Shadow, MD [facility] 08/19/2023

## 2023-08-19 NOTE — [place] FL2 (Signed)
[place] MEDICAID FL2 LEVEL OF CARE FORM     IDENTIFICATION  Patient Name: Walter Harris Birthdate: 24-Sep-1945 Sex: male Admission Date (Current Location): 08/15/2023  [place] and [place] Number:  Chiropodist and Address:  [facility], [address], [place], [place] [address]      Provider Number: [id number]  Attending Physician Name and Address:  Lurene Shadow, MD  Relative Name and Phone Number:  Loretha Stapler (Sister)  [phone]    Current Level of Care: Hospital Recommended Level of Care:  (LTC with hospice) Prior Approval Number:    Date Approved/Denied:   PASRR Number: [id number]  Discharge Plan: SNF    Current Diagnoses: Patient Active Problem List   Diagnosis Date Noted   ICH (intracerebral hemorrhage) (HCC) 08/15/2023   UTI (urinary tract infection) 08/15/2023   AKI (acute kidney injury) (HCC) 08/15/2023   Elevated troponin 08/15/2023   Dementia (HCC) 08/15/2023   Fever 08/15/2023   Intraparenchymal hemorrhage of brain (HCC) 08/15/2023   Protein-calorie malnutrition, severe 02/19/2023   Severe sepsis (HCC) 02/12/2023   Pressure injury of skin 02/12/2023   Hyperkalemia 02/12/2023   Nonverbal 02/12/2023   Altered mental status 02/12/2023   Weakness generalized 02/12/2023   Insomnia 01/09/2022   Decreased dorsalis pedis pulse 01/09/2022   Dupuytren contracture 02/21/2021   Lower extremity edema 02/21/2021   Left shoulder pain 11/11/2020   Statin myopathy 09/10/2020   Fall at home 08/09/2020   History of CVA in adulthood 12/07/2019   Gait abnormality 11/12/2019   Tremor, essential 11/12/2019   Health care maintenance 10/04/2018   Thumb pain 10/04/2018   Memory change 12/17/2017   Paresthesia 12/03/2017   Lightheaded 08/28/2017   Encounter for chronic pain management 06/18/2017   Neck pain 05/14/2017   Panic 01/10/2017   Muscle ache 10/05/2015   Syncope 07/08/2015   Chronic back pain 04/12/2015    Medicare annual wellness visit, initial 03/08/2015   Advance care planning 03/08/2015   Dysgeusia 11/28/2014   Anxiety state 10/27/2014   Diabetic polyneuropathy (HCC) 02/14/2014   Rhinitis, chronic 01/13/2012   Basal cell carcinoma    LUMBAR RADICULOPATHY 10/16/2010   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/07/2010   HLD (hyperlipidemia) 05/13/2008   Uncontrolled type 2 diabetes mellitus with hyperglycemia (HCC) 05/26/2007   Essential hypertension 05/26/2007   SCOLIOSIS 05/26/2007    Orientation RESPIRATION BLADDER Height & Weight        Normal Incontinent Weight: 147 lb 7.8 oz (66.9 kg) Height:  5\' 8"  (172.7 cm)  BEHAVIORAL SYMPTOMS/MOOD NEUROLOGICAL BOWEL NUTRITION STATUS      Incontinent    AMBULATORY STATUS COMMUNICATION OF NEEDS Skin     Non-Verbally Normal                       Personal Care Assistance Level of Assistance              Functional Limitations Info  Sight, Hearing Sight Info: Impaired Hearing Info: Impaired Speech Info: Impaired    SPECIAL CARE FACTORS FREQUENCY                       Contractures Contractures Info: Not present    Additional Factors Info  Code Status, Allergies Code Status Info: DNR-comfort Allergies Info: Bee Venom  Pravastatin  Ace Inhibitors  Actos (Pioglitazone)  Angiotensin Receptor Blockers  Aripiprazole  Crestor (Rosuvastatin Calcium)  Invokana (Canagliflozin)  Metformin And Related  Nsaids  Olanzapine  Pneumovax 23 (Pneumococcal Vac Polyvalent)  Codeine  Divalproex Sodium  Lithium Carbonate  Sulfamethoxazole-trimethoprim           Current Medications (08/19/2023):  This is the current hospital active medication list Current Facility-Administered Medications  Medication Dose Route Frequency Provider Last Rate Last Admin   acetaminophen (TYLENOL) tablet 650 mg  650 mg Oral Q6H PRN Theotis Burrow, NP       Or   acetaminophen (TYLENOL) suppository 650 mg  650 mg Rectal Q6H PRN Theotis Burrow, NP       amoxicillin (AMOXIL) capsule 500 mg  500 mg Oral Q8H Lurene Shadow, MD       antiseptic oral rinse (BIOTENE) solution 15 mL  15 mL Topical PRN Theotis Burrow, NP       bisacodyl (DULCOLAX) suppository 10 mg  10 mg Rectal Once Lurene Shadow, MD       docusate sodium (COLACE) capsule 100 mg  100 mg Oral BID PRN Lurene Shadow, MD       glycopyrrolate (ROBINUL) tablet 1 mg  1 mg Oral Q4H PRN Theotis Burrow, NP       Or   glycopyrrolate (ROBINUL) injection 0.2 mg  0.2 mg Subcutaneous Q4H PRN Theotis Burrow, NP       Or   glycopyrrolate (ROBINUL) injection 0.2 mg  0.2 mg Intravenous Q4H PRN Theotis Burrow, NP       haloperidol (HALDOL) tablet 0.5 mg  0.5 mg Oral Q4H PRN Theotis Burrow, NP       Or   haloperidol (HALDOL) 2 MG/ML solution 0.5 mg  0.5 mg Sublingual Q4H PRN Theotis Burrow, NP       Or   haloperidol lactate (HALDOL) injection 0.5 mg  0.5 mg Intravenous Q4H PRN Theotis Burrow, NP   0.5 mg at 08/17/23 1409   LORazepam (ATIVAN) tablet 1 mg  1 mg Oral Q4H PRN Theotis Burrow, NP   1 mg at 08/18/23 2209   Or   LORazepam (ATIVAN) 2 MG/ML concentrated solution 1 mg  1 mg Sublingual Q4H PRN Theotis Burrow, NP       Or   LORazepam (ATIVAN) injection 1 mg  1 mg Intravenous Q4H PRN Theotis Burrow, NP   1 mg at 08/17/23 1318   morphine (PF) 2 MG/ML injection 2 mg  2 mg Intravenous Q4H PRN Lurene Shadow, MD       morphine CONCENTRATE 10 mg / 0.5 ml oral solution 5 mg  5 mg Oral Q1H PRN Theotis Burrow, NP       Or   morphine CONCENTRATE 10 mg / 0.5 ml oral solution 5 mg  5 mg Sublingual Q1H PRN Theotis Burrow, NP   5 mg at 08/17/23 1601   ondansetron (ZOFRAN-ODT) disintegrating tablet 4 mg  4 mg Oral Q6H PRN Theotis Burrow, NP       Or   ondansetron (ZOFRAN) injection 4 mg  4 mg Intravenous Q6H PRN Theotis Burrow, NP       Oral care mouth rinse  15 mL Mouth Rinse PRN Lurene Shadow, MD       polyethylene glycol (MIRALAX / GLYCOLAX) packet 17 g  17 g Oral  Daily PRN Lurene Shadow, MD       polyvinyl alcohol (LIQUIFILM TEARS) 1.4 % ophthalmic solution 1 drop  1 drop Both Eyes QID PRN Theotis Burrow, NP       senna-docusate (  Senokot-S) tablet 1 tablet  1 tablet Oral BID Lurene Shadow, MD   1 tablet at 08/19/23 0900   traZODone (DESYREL) tablet 75 mg  75 mg Oral QHS PRN Lurene Shadow, MD   75 mg at 08/15/23 2324     Discharge Medications: Please see discharge summary for a list of discharge medications.  STOP taking these medications     ALPRAZolam 0.5 MG tablet Commonly known as: XANAX    aspirin EC 81 MG tablet    BD Pen Needle Nano U/F 32G X 4 MM Misc Generic drug: Insulin Pen Needle    cyanocobalamin 1000 MCG tablet    feeding supplement Liqd    FLUoxetine 10 MG capsule Commonly known as: PROZAC    folic acid 1 MG tablet Commonly known as: FOLVITE    glipiZIDE 10 MG 24 hr tablet Commonly known as: GLUCOTROL XL    irbesartan 150 MG tablet Commonly known as: AVAPRO    Lantus SoloStar 100 UNIT/ML Solostar Pen Generic drug: insulin glargine    metFORMIN 500 MG 24 hr tablet Commonly known as: GLUCOPHAGE-XR    multivitamin with minerals Tabs tablet    primidone 50 MG tablet Commonly known as: MYSOLINE    propranolol ER 60 MG 24 hr capsule Commonly known as: INDERAL LA    tiZANidine 4 MG tablet Commonly known as: ZANAFLEX    Vitamin D (Ergocalciferol) 1.25 MG (50000 UNIT) Caps capsule Commonly known as: DRISDOL           TAKE these medications     amoxicillin 500 MG capsule Commonly known as: AMOXIL Take 1 capsule (500 mg total) by mouth 3 (three) times daily for 3 days.    oxyCODONE-acetaminophen 5-325 MG tablet Commonly known as: PERCOCET/ROXICET TAKE 1-2 TABLETS BY MOUTH 3 TIMES DAILY AS NEEDED FOR PAIN. Fill on/after 60 days from rx    oxyCODONE-acetaminophen 5-325 MG tablet Commonly known as: PERCOCET/ROXICET TAKE ONE TO TWO TABLETS BY MOUTH 3 TIMES DAILY IF NEEDED FOR PAIN.     oxyCODONE-acetaminophen 5-325 MG tablet Commonly known as: PERCOCET/ROXICET TAKE ONE TO TWO TABLETS BY MOUTH 3 TIMESDAILY AS NEEDED FOR PAIN.  Fill on/after 30 days from rx    traZODone 50 MG tablet Commonly known as: DESYREL TAKE 1 AND 1/2 TABLETS BY MOUTH AT BEDTIME AS NEEDED FOR SLEEP        Relevant Imaging Results:  Relevant Lab Results:   Additional Information SSN: [id number]  Allena Katz, LCSW

## 2023-08-19 NOTE — TOC Initial Note (Signed)
Transition of Care [facility]) - Initial/Assessment Note    Patient Details  Name: Walter Harris MRN: [id number] Date of Birth: 1945-10-30  Transition of Care [facility]) CM/SW Contact:    Allena Katz, LCSW Phone Number: 08/19/2023, 11:17 AM  Clinical Narrative:   Pt admitted from [facility] and will get patient started with hospice in the facility if we put it in the DC summary CSW has told MD.                 Expected Discharge Plan: Skilled Nursing Facility Barriers to Discharge: Continued Medical Work up   Patient Goals and CMS Choice Patient states their goals for this hospitalization and ongoing recovery are:: return to [facility]          Expected Discharge Plan and Services       Living arrangements for the past 2 months: Skilled Nursing Facility                                      Prior Living Arrangements/Services Living arrangements for the past 2 months: Skilled Nursing Facility Lives with:: Facility Resident Patient language and need for interpreter reviewed:: Yes Do you feel safe going back to the place where you live?: Yes      Need for Family Participation in Patient Care: Yes (Comment) Care giver support system in place?: Yes (comment)      Activities of Daily Living      Permission Sought/Granted                  Emotional Assessment       Orientation: : Fluctuating Orientation (Suspected and/or reported Sundowners)      Admission diagnosis:  Lower urinary tract infectious disease [N39.0] ICH (intracerebral hemorrhage) (HCC) [I61.9] Intraparenchymal hemorrhage of brain (HCC) [I61.9] Fever, unspecified fever cause [R50.9] Altered mental status, unspecified altered mental status type [R41.82] Community acquired pneumonia, unspecified laterality [J18.9] Sepsis, due to unspecified organism, unspecified whether acute organ dysfunction present [facility]) [A41.9] Patient Active Problem  List   Diagnosis Date Noted   ICH (intracerebral hemorrhage) (HCC) 08/15/2023   UTI (urinary tract infection) 08/15/2023   AKI (acute kidney injury) (HCC) 08/15/2023   Elevated troponin 08/15/2023   Dementia (HCC) 08/15/2023   Fever 08/15/2023   Intraparenchymal hemorrhage of brain (HCC) 08/15/2023   Protein-calorie malnutrition, severe 02/19/2023   Severe sepsis (HCC) 02/12/2023   Pressure injury of skin 02/12/2023   Hyperkalemia 02/12/2023   Nonverbal 02/12/2023   Altered mental status 02/12/2023   Weakness generalized 02/12/2023   Insomnia 01/09/2022   Decreased dorsalis pedis pulse 01/09/2022   Dupuytren contracture 02/21/2021   Lower extremity edema 02/21/2021   Left shoulder pain 11/11/2020   Statin myopathy 09/10/2020   Fall at home 08/09/2020   History of CVA in adulthood 12/07/2019   Gait abnormality 11/12/2019   Tremor, essential 11/12/2019   Health care maintenance 10/04/2018   Thumb pain 10/04/2018   Memory change 12/17/2017   Paresthesia 12/03/2017   Lightheaded 08/28/2017   Encounter for chronic pain management 06/18/2017   Neck pain 05/14/2017   Panic 01/10/2017   Muscle ache 10/05/2015   Syncope 07/08/2015   Chronic back pain 04/12/2015   Medicare annual wellness visit, initial 03/08/2015   Advance care planning 03/08/2015   Dysgeusia 11/28/2014   Anxiety state 10/27/2014   Diabetic  polyneuropathy (HCC) 02/14/2014   Rhinitis, chronic 01/13/2012   Basal cell carcinoma    LUMBAR RADICULOPATHY 10/16/2010   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/07/2010   HLD (hyperlipidemia) 05/13/2008   Uncontrolled type 2 diabetes mellitus with hyperglycemia (HCC) 05/26/2007   Essential hypertension 05/26/2007   SCOLIOSIS 05/26/2007   PCP:  Joaquim Nam, MD Pharmacy:   [facility] - [place], [place] - [address] [place] [address] Phone: [phone] Fax: [phone]     Social Determinants of Health  (SDOH) Social History: SDOH Screenings   Food Insecurity: Patient Unable To Answer (08/15/2023)  Housing: Patient Unable To Answer (02/13/2023)  Transportation Needs: Patient Unable To Answer (08/15/2023)  Utilities: Patient Unable To Answer (08/15/2023)  Alcohol Screen: Low Risk  (10/31/2021)  Depression (PHQ2-9): Low Risk  (11/01/2022)  Financial Resource Strain: Low Risk  (11/01/2022)  Physical Activity: Inactive (11/01/2022)  Social Connections: Socially Isolated (11/01/2022)  Stress: No Stress Concern Present (11/01/2022)  Tobacco Use: Medium Risk (08/15/2023)   SDOH Interventions: Utilities Interventions: Patient Unable to Answer   Readmission Risk Interventions     No data to display

## 2023-08-19 NOTE — TOC Progression Note (Signed)
Transition of Care [facility]) - Progression Note    Patient Details  Name: Walter Harris MRN: [id number] Date of Birth: 1946-04-11  Transition of Care [facility]) CM/SW Contact  Allena Katz, LCSW Phone Number: 08/19/2023, 3:37 PM  Clinical Narrative:   Pt unable to have a BM and cannot discharge [facility] today.    Expected Discharge Plan: Skilled Nursing Facility Barriers to Discharge: Barriers Resolved  Expected Discharge Plan and Services       Living arrangements for the past 2 months: Skilled Nursing Facility Expected Discharge Date: 08/19/23                                     Social Determinants of Health (SDOH) Interventions SDOH Screenings   Food Insecurity: Patient Unable To Answer (08/15/2023)  Housing: Patient Unable To Answer (02/13/2023)  Transportation Needs: Patient Unable To Answer (08/15/2023)  Utilities: Patient Unable To Answer (08/15/2023)  Alcohol Screen: Low Risk  (10/31/2021)  Depression (PHQ2-9): Low Risk  (11/01/2022)  Financial Resource Strain: Low Risk  (11/01/2022)  Physical Activity: Inactive (11/01/2022)  Social Connections: Socially Isolated (11/01/2022)  Stress: No Stress Concern Present (11/01/2022)  Tobacco Use: Medium Risk (08/15/2023)    Readmission Risk Interventions     No data to display

## 2023-08-19 NOTE — Plan of Care (Signed)
  Problem: Education: Goal: Knowledge of disease or condition will improve Outcome: Progressing Goal: Knowledge of secondary prevention will improve (MUST DOCUMENT ALL) Outcome: Progressing Goal: Knowledge of patient specific risk factors will improve Loraine Leriche N/A or DELETE if not current risk factor) Outcome: Progressing   Problem: Intracerebral Hemorrhage Tissue Perfusion: Goal: Complications of Intracerebral Hemorrhage will be minimized Outcome: Progressing   Problem: Coping: Goal: Will verbalize positive feelings about self Outcome: Progressing Goal: Will identify appropriate support needs Outcome: Progressing   Problem: Health Behavior/Discharge Planning: Goal: Ability to manage health-related needs will improve Outcome: Progressing Goal: Goals will be collaboratively established with patient/family Outcome: Progressing   Problem: Self-Care: Goal: Ability to participate in self-care as condition permits will improve Outcome: Progressing Goal: Verbalization of feelings and concerns over difficulty with self-care will improve Outcome: Progressing Goal: Ability to communicate needs accurately will improve Outcome: Progressing   Problem: Nutrition: Goal: Risk of aspiration will decrease Outcome: Progressing Goal: Dietary intake will improve Outcome: Progressing   Problem: Education: Goal: Ability to describe self-care measures that may prevent or decrease complications (Diabetes Survival Skills Education) will improve Outcome: Progressing Goal: Individualized Educational Video(s) Outcome: Progressing   Problem: Coping: Goal: Ability to adjust to condition or change in health will improve Outcome: Progressing   Problem: Fluid Volume: Goal: Ability to maintain a balanced intake and output will improve Outcome: Progressing   Problem: Health Behavior/Discharge Planning: Goal: Ability to identify and utilize available resources and services will improve Outcome:  Progressing Goal: Ability to manage health-related needs will improve Outcome: Progressing   Problem: Metabolic: Goal: Ability to maintain appropriate glucose levels will improve Outcome: Progressing   Problem: Nutritional: Goal: Maintenance of adequate nutrition will improve Outcome: Progressing Goal: Progress toward achieving an optimal weight will improve Outcome: Progressing   Problem: Skin Integrity: Goal: Risk for impaired skin integrity will decrease Outcome: Progressing   Problem: Tissue Perfusion: Goal: Adequacy of tissue perfusion will improve Outcome: Progressing   Problem: Education: Goal: Knowledge of General Education information will improve Description: Including pain rating scale, medication(s)/side effects and non-pharmacologic comfort measures Outcome: Progressing   Problem: Health Behavior/Discharge Planning: Goal: Ability to manage health-related needs will improve Outcome: Progressing   Problem: Clinical Measurements: Goal: Ability to maintain clinical measurements within normal limits will improve Outcome: Progressing Goal: Will remain free from infection Outcome: Progressing Goal: Diagnostic test results will improve Outcome: Progressing Goal: Respiratory complications will improve Outcome: Progressing Goal: Cardiovascular complication will be avoided Outcome: Progressing   Problem: Activity: Goal: Risk for activity intolerance will decrease Outcome: Progressing   Problem: Nutrition: Goal: Adequate nutrition will be maintained Outcome: Progressing   Problem: Coping: Goal: Level of anxiety will decrease Outcome: Progressing   Problem: Elimination: Goal: Will not experience complications related to bowel motility Outcome: Progressing Goal: Will not experience complications related to urinary retention Outcome: Progressing   Problem: Pain Management: Goal: General experience of comfort will improve Outcome: Progressing   Problem:  Safety: Goal: Ability to remain free from injury will improve Outcome: Progressing   Problem: Skin Integrity: Goal: Risk for impaired skin integrity will decrease Outcome: Progressing   Problem: Education: Goal: Knowledge of the prescribed therapeutic regimen will improve Outcome: Progressing   Problem: Coping: Goal: Ability to identify and develop effective coping behavior will improve Outcome: Progressing

## 2023-08-19 NOTE — Progress Notes (Signed)
Suppository was ordered for patient. POA (sister [name]) declined suppository earlier today as they were expecting a family call. POA requested patient to have PRN colace. RN attempted to administer PRN colace w/ scheduled meds. Due to colace being a gel capsule, patient was unable to swallow as he takes meds crushed in pudding. RN and POA discussed suppository being given. POA understands that patient will not be able to go to [facility] unless he has a bowel movement. POA requests suppository to be done in AM so patient can sleep tonight. RN agrees w/ plan and will administer suppository in AM.

## 2023-08-19 NOTE — TOC Transition Note (Signed)
Transition of Care [facility]) - CM/SW Discharge Note   Patient Details  Name: Walter Harris MRN: [id number] Date of Birth: September 12, 1945  Transition of Care [facility]) CM/SW Contact:  Allena Katz, LCSW Phone Number: 08/19/2023, 11:41 AM   Clinical Narrative:   Pt has [facility]. Sister [name] notified that pt would be moved today. [facility] to [facility] today and they will arrange hospice for him at the facility. CSW to call ems and send dc summary once in.     Final next level of care: Skilled Nursing Facility Barriers to Discharge: Barriers Resolved   Patient Goals and CMS Choice      Discharge Placement                Patient chooses bed at: [facility] Patient to be transferred to facility by: [facility] Name of family member notified: [name] Patient and family notified of of transfer: 08/19/23  Discharge Plan and Services Additional resources added to the After Visit Summary for                                       Social Determinants of Health (SDOH) Interventions SDOH Screenings   Food Insecurity: Patient Unable To Answer (08/15/2023)  Housing: Patient Unable To Answer (02/13/2023)  Transportation Needs: Patient Unable To Answer (08/15/2023)  Utilities: Patient Unable To Answer (08/15/2023)  Alcohol Screen: Low Risk  (10/31/2021)  Depression (PHQ2-9): Low Risk  (11/01/2022)  Financial Resource Strain: Low Risk  (11/01/2022)  Physical Activity: Inactive (11/01/2022)  Social Connections: Socially Isolated (11/01/2022)  Stress: No Stress Concern Present (11/01/2022)  Tobacco Use: Medium Risk (08/15/2023)     Readmission Risk Interventions     No data to display

## 2023-08-20 DIAGNOSIS — E1165 Type 2 diabetes mellitus with hyperglycemia: Secondary | ICD-10-CM

## 2023-08-20 DIAGNOSIS — N179 Acute kidney failure, unspecified: Secondary | ICD-10-CM | POA: Diagnosis not present

## 2023-08-20 DIAGNOSIS — I619 Nontraumatic intracerebral hemorrhage, unspecified: Secondary | ICD-10-CM | POA: Diagnosis not present

## 2023-08-20 DIAGNOSIS — I611 Nontraumatic intracerebral hemorrhage in hemisphere, cortical: Secondary | ICD-10-CM | POA: Diagnosis not present

## 2023-08-20 DIAGNOSIS — R7989 Other specified abnormal findings of blood chemistry: Secondary | ICD-10-CM

## 2023-08-20 DIAGNOSIS — N3000 Acute cystitis without hematuria: Secondary | ICD-10-CM

## 2023-08-20 LAB — CULTURE, BLOOD (ROUTINE X 2)
Culture: NO GROWTH
Culture: NO GROWTH
Special Requests: ADEQUATE
Special Requests: ADEQUATE

## 2023-08-20 NOTE — TOC Transition Note (Signed)
Transition of Care [facility]) - CM/SW Discharge Note   Patient Details  Name: Walter Harris MRN: [id number] Date of Birth: [birth year]  Transition of Care [facility]) CM/SW Contact:  Allena Katz, LCSW Phone Number: 08/20/2023, 11:15 AM   Clinical Narrative:  Pt has had a BM and is able to [facility]. RN given number for report. DC summary to be sent once in. Medical necessity printed to the unit.      Final next level of care: Skilled Nursing Facility Barriers to Discharge: Barriers Resolved   Patient Goals and CMS Choice      Discharge Placement                Patient [facility] bed at: [facility] Patient to be transferred to facility by: [facility] Name of family member notified: [name] Patient and family notified of of transfer: 08/20/23  Discharge Plan and Services Additional resources added to the After Visit Summary for                                       Social Determinants of Health (SDOH) Interventions SDOH Screenings   Food Insecurity: Patient Unable To Answer (08/15/2023)  Housing: Patient Unable To Answer (02/13/2023)  Transportation Needs: Patient Unable To Answer (08/15/2023)  Utilities: Patient Unable To Answer (08/15/2023)  Alcohol Screen: Low Risk  (10/31/2021)  Depression (PHQ2-9): Low Risk  (11/01/2022)  Financial Resource Strain: Low Risk  (11/01/2022)  Physical Activity: Inactive (11/01/2022)  Social Connections: Socially Isolated (11/01/2022)  Stress: No Stress Concern Present (11/01/2022)  Tobacco Use: Medium Risk (08/15/2023)     Readmission Risk Interventions     No data to display

## 2023-08-20 NOTE — Progress Notes (Signed)
Progress Note   Patient: Walter Harris WGN:[id number] DOB: 10/21/1945 DOA: 08/15/2023     5 DOS: the patient was seen and examined on 08/20/2023   Brief hospital course: Walter Harris is a 77 y.o. male with medical history significant for recently diagnosed UTI, dementia, hypertension, hyperlipidemia, type II DM, CAD, history of stroke, essential tremors, hard of hearing, basal cell carcinoma, degenerative joint disease, who was brought from the nursing home to the ED because of acute change in mental status. He was febrile with temperature of 102.7 F, tachypneic, tachycardic and hypertensive in the ED.   Assessment and Plan: Intracranial hemorrhage in the right occipital lobe with regional vasogenic edema: Patient has been transitioned to comfort care.     Sepsis secondary to acute UTI: complete amoxicillin at discharge as per family request.    AKI, non-anion gap metabolic acidosis, hyponatremia: Creatinine has improved.  BMP was not checked again because he is on comfort care.    Hypomagnesemia: Improved   Acute metabolic encephalopathy on underlying dementia: now appears to be at baseline..    Type II DM with hyperglycemia: Glucose checks and insulin therapy be discontinued per family request.    History of mild left MCA stroke (infarct) supportive care.  [facility]. DC summary from yesterday is amended to reflect today's date.      Out of bed to chair. Incentive spirometry. Nursing supportive care. Fall, aspiration precautions. DVT prophylaxis   Code Status: Do not attempt resuscitation (DNR) - Comfort care   Subjective: Patient is seen and examined today morning. He is sleeping comfortably. Wife at bedside. He did not have bowel movement and so was not discharged yesterday. Today he had good BM and stable to go to SNF.  Physical Exam: Vitals:   08/18/23 1957 08/19/23 0828 08/19/23 1943 08/20/23 0718  BP: 113/77 (!) 112/97  111/85 (!) 127/92  Pulse: (!) 101 95 (!) 106 97  Resp: 18 18 18 16   Temp: (!) 97.4 F (36.3 C) 97.7 F (36.5 C) 97.9 F (36.6 C) 98.1 F (36.7 C)  TempSrc:  Axillary Oral   SpO2: 98% 98% 97% 97%  Weight:      Height:        General - Elderly Caucasian male, no apparent distress, sleeping comfortably HEENT - PERRLA, EOMI, atraumatic head, non tender sinuses. Lung - diffuse rales, no rhonchi, wheezes. Heart - S1, S2 heard, no murmurs, rubs, trace pedal edema. Abdomen - Soft, non tender, bowel sounds good Neuro - sleeping, non focal exam. Skin - Warm and dry.  Data Reviewed:      Latest Ref Rng & Units 08/15/2023    1:34 AM 02/18/2023    5:33 AM 02/17/2023    4:20 AM  CBC  WBC 4.0 - 10.5 K/uL 8.5  8.9  8.0   Hemoglobin 13.0 - 17.0 g/dL 78.4  69.6  29.5   Hematocrit 39.0 - 52.0 % 37.1  33.7  32.4   Platelets 150 - 400 K/uL 221  191  164       Latest Ref Rng & Units 08/17/2023    3:45 AM 08/16/2023    4:50 AM 08/15/2023   11:14 AM  BMP  Glucose 70 - 99 mg/dL 284  132  440   BUN 8 - 23 mg/dL 25  21  16    Creatinine 0.61 - 1.24 mg/dL 1.02  7.25  3.66   Sodium 135 - 145 mmol/L 133  136  136   Potassium 3.5 - 5.1 mmol/L 3.4  3.6  3.6   Chloride 98 - 111 mmol/L 104  108  107   CO2 22 - 32 mmol/L 20  22  20    Calcium 8.9 - 10.3 mg/dL 7.8  7.6  7.8    No results found.   Family Communication: Discussed with patient's wife at bedside, she understand and agree with discharge plan. All questions answereed.    Disposition: Status is: Inpatient Remains inpatient appropriate because: plan to dc today as he had good bowel movement. Please see yesterday's DC summary.  Planned Discharge Destination: Skilled nursing facility with hospice care. Plan to discharge to [facility] today.     Time spent: 36 minutes  Author: Marcelino Duster, MD 08/20/2023 12:03 PM Secure chat 7am to 7pm For on call review [email or web address].

## 2023-08-20 NOTE — Progress Notes (Signed)
Pt sent via EMS to [facility]. I called [phone] unable to get a hold of anyone to give report to.

## 2023-08-20 NOTE — Progress Notes (Addendum)
I called facility again to try to get through to oncoming nurse to give report. Called [phone]. Got sent to the nurse ext and when I said who I was she said "I know you didn't give report earlier" I said.. " I had called to give report and I wasn't able to get anyone to pick up the phone" She then said hold on and placed me on an 8 minute hold. Report still unable to be given at this time.  Nurse Milagros Loll, called me back and received report from me [phone].

## 2023-08-21 NOTE — Consult Note (Signed)
[facility] Liaison Note  08/21/2023  Walter Harris 01-28-1946 [id number]  Location: RN Hospital Liaison screened the patient remotely at [facility].  Insurance: [employer]   [name] is a 77 y.o. male who is a Primary Care Patient of Para March, Dwana Curd, MD [facility]. The patient was screened for  readmission hospitalization with noted medium risk score for unplanned readmission risk with 2 IP in 6 months.  The patient was assessed for potential Care Management service needs for post hospital transition for care coordination. Review of patient's electronic medical record reveals patient was admitted with Sepsis. Pt discharged to SNF level of care. The facility will continue to address pt's ongoing needs.   [facility] Care Management/Population Health does not replace or interfere with any arrangements made by the Inpatient Transition of Care team.   For questions contact:   Elliot Cousin, RN, [facility] [place] Liaison    [facility], Population Health Office Hours MTWF  8:00 am-6:00 pm Direct Dial: [phone] mobile [phone] [Office toll free line] Office Hours are M-F 8:30 - 5 pm [email or web address]

## 2023-08-22 DIAGNOSIS — N179 Acute kidney failure, unspecified: Secondary | ICD-10-CM | POA: Diagnosis not present

## 2023-08-22 DIAGNOSIS — E1165 Type 2 diabetes mellitus with hyperglycemia: Secondary | ICD-10-CM | POA: Diagnosis not present

## 2023-08-22 DIAGNOSIS — Z515 Encounter for palliative care: Secondary | ICD-10-CM | POA: Diagnosis not present

## 2023-08-22 DIAGNOSIS — N390 Urinary tract infection, site not specified: Secondary | ICD-10-CM | POA: Diagnosis not present

## 2023-08-22 DIAGNOSIS — A419 Sepsis, unspecified organism: Secondary | ICD-10-CM | POA: Diagnosis not present

## 2023-08-27 DIAGNOSIS — E1165 Type 2 diabetes mellitus with hyperglycemia: Secondary | ICD-10-CM | POA: Diagnosis not present

## 2023-12-31 DIAGNOSIS — M6281 Muscle weakness (generalized): Secondary | ICD-10-CM | POA: Diagnosis not present

## 2023-12-31 DIAGNOSIS — E1165 Type 2 diabetes mellitus with hyperglycemia: Secondary | ICD-10-CM | POA: Diagnosis not present

## 2023-12-31 DIAGNOSIS — Z515 Encounter for palliative care: Secondary | ICD-10-CM | POA: Diagnosis not present

## 2023-12-31 DIAGNOSIS — I69398 Other sequelae of cerebral infarction: Secondary | ICD-10-CM | POA: Diagnosis not present

## 2023-12-31 DIAGNOSIS — E538 Deficiency of other specified B group vitamins: Secondary | ICD-10-CM | POA: Diagnosis not present

## 2024-01-09 DIAGNOSIS — G8929 Other chronic pain: Secondary | ICD-10-CM | POA: Diagnosis not present

## 2024-01-22 DIAGNOSIS — Z794 Long term (current) use of insulin: Secondary | ICD-10-CM | POA: Diagnosis not present

## 2024-01-22 DIAGNOSIS — I129 Hypertensive chronic kidney disease with stage 1 through stage 4 chronic kidney disease, or unspecified chronic kidney disease: Secondary | ICD-10-CM | POA: Diagnosis not present

## 2024-01-22 DIAGNOSIS — N182 Chronic kidney disease, stage 2 (mild): Secondary | ICD-10-CM | POA: Diagnosis not present

## 2024-01-22 DIAGNOSIS — E1122 Type 2 diabetes mellitus with diabetic chronic kidney disease: Secondary | ICD-10-CM | POA: Diagnosis not present

## 2024-03-01 DIAGNOSIS — L603 Nail dystrophy: Secondary | ICD-10-CM | POA: Diagnosis not present

## 2024-03-01 DIAGNOSIS — E1151 Type 2 diabetes mellitus with diabetic peripheral angiopathy without gangrene: Secondary | ICD-10-CM | POA: Diagnosis not present

## 2024-03-01 DIAGNOSIS — Z794 Long term (current) use of insulin: Secondary | ICD-10-CM | POA: Diagnosis not present

## 2024-03-01 DIAGNOSIS — L602 Onychogryphosis: Secondary | ICD-10-CM | POA: Diagnosis not present

## 2024-04-23 DIAGNOSIS — N390 Urinary tract infection, site not specified: Secondary | ICD-10-CM | POA: Diagnosis not present

## 2024-05-03 DIAGNOSIS — L603 Nail dystrophy: Secondary | ICD-10-CM | POA: Diagnosis not present

## 2024-05-03 DIAGNOSIS — L602 Onychogryphosis: Secondary | ICD-10-CM | POA: Diagnosis not present

## 2024-05-03 DIAGNOSIS — Z794 Long term (current) use of insulin: Secondary | ICD-10-CM | POA: Diagnosis not present

## 2024-05-03 DIAGNOSIS — E1151 Type 2 diabetes mellitus with diabetic peripheral angiopathy without gangrene: Secondary | ICD-10-CM | POA: Diagnosis not present

## 2024-08-16 DIAGNOSIS — L602 Onychogryphosis: Secondary | ICD-10-CM | POA: Diagnosis not present

## 2024-08-16 DIAGNOSIS — E1151 Type 2 diabetes mellitus with diabetic peripheral angiopathy without gangrene: Secondary | ICD-10-CM | POA: Diagnosis not present

## 2024-08-16 DIAGNOSIS — L603 Nail dystrophy: Secondary | ICD-10-CM | POA: Diagnosis not present

## 2024-08-16 DIAGNOSIS — Z794 Long term (current) use of insulin: Secondary | ICD-10-CM | POA: Diagnosis not present

## 2024-10-10 DEATH — deceased
